# Patient Record
Sex: Female | Born: 1950
Health system: Southern US, Community
[De-identification: ages and names within clinical notes are randomized; demographics above are authoritative.]

## PROBLEM LIST (undated history)

## (undated) DIAGNOSIS — G2581 Restless legs syndrome: Secondary | ICD-10-CM

## (undated) DIAGNOSIS — M797 Fibromyalgia: Secondary | ICD-10-CM

## (undated) DIAGNOSIS — M659 Unspecified synovitis and tenosynovitis, unspecified site: Secondary | ICD-10-CM

## (undated) DIAGNOSIS — L719 Rosacea, unspecified: Secondary | ICD-10-CM

## (undated) DIAGNOSIS — F329 Major depressive disorder, single episode, unspecified: Secondary | ICD-10-CM

## (undated) DIAGNOSIS — C801 Malignant (primary) neoplasm, unspecified: Secondary | ICD-10-CM

## (undated) DIAGNOSIS — R609 Edema, unspecified: Secondary | ICD-10-CM

## (undated) DIAGNOSIS — R7303 Prediabetes: Secondary | ICD-10-CM

## (undated) DIAGNOSIS — I10 Essential (primary) hypertension: Secondary | ICD-10-CM

## (undated) DIAGNOSIS — I890 Lymphedema, not elsewhere classified: Secondary | ICD-10-CM

## (undated) DIAGNOSIS — F32A Depression, unspecified: Secondary | ICD-10-CM

## (undated) DIAGNOSIS — M1990 Unspecified osteoarthritis, unspecified site: Secondary | ICD-10-CM

## (undated) DIAGNOSIS — G4730 Sleep apnea, unspecified: Secondary | ICD-10-CM

## (undated) DIAGNOSIS — M75100 Unspecified rotator cuff tear or rupture of unspecified shoulder, not specified as traumatic: Secondary | ICD-10-CM

## (undated) DIAGNOSIS — I7300 Raynaud's syndrome without gangrene: Secondary | ICD-10-CM

## (undated) DIAGNOSIS — G5600 Carpal tunnel syndrome, unspecified upper limb: Secondary | ICD-10-CM

## (undated) DIAGNOSIS — IMO0002 Reserved for concepts with insufficient information to code with codable children: Secondary | ICD-10-CM

## (undated) DIAGNOSIS — E785 Hyperlipidemia, unspecified: Secondary | ICD-10-CM

## (undated) DIAGNOSIS — D649 Anemia, unspecified: Secondary | ICD-10-CM

## (undated) DIAGNOSIS — H5440 Blindness, one eye, unspecified eye: Secondary | ICD-10-CM

## (undated) DIAGNOSIS — K219 Gastro-esophageal reflux disease without esophagitis: Secondary | ICD-10-CM

## (undated) DIAGNOSIS — M722 Plantar fascial fibromatosis: Secondary | ICD-10-CM

## (undated) HISTORY — PX: TONSILLECTOMY: SUR1361

## (undated) HISTORY — DX: Malignant (primary) neoplasm, unspecified: C80.1

## (undated) HISTORY — DX: Synovitis and tenosynovitis, unspecified: M65.9

## (undated) HISTORY — DX: Unspecified osteoarthritis, unspecified site: M19.90

## (undated) HISTORY — DX: Hyperlipidemia, unspecified: E78.5

## (undated) HISTORY — DX: Depression, unspecified: F32.A

## (undated) HISTORY — DX: Fibromyalgia: M79.7

## (undated) HISTORY — DX: Rosacea, unspecified: L71.9

## (undated) HISTORY — PX: PORT-A-CATH REMOVAL: SHX5289

## (undated) HISTORY — PX: EYE SURGERY: SHX253

## (undated) HISTORY — PX: UVULOPALATOPHARYNGOPLASTY: SHX827

## (undated) HISTORY — DX: Essential (primary) hypertension: I10

## (undated) HISTORY — DX: Restless legs syndrome: G25.81

## (undated) HISTORY — DX: Reserved for concepts with insufficient information to code with codable children: IMO0002

## (undated) HISTORY — DX: Unspecified synovitis and tenosynovitis, unspecified site: M65.90

## (undated) HISTORY — DX: Anemia, unspecified: D64.9

## (undated) HISTORY — DX: Blindness, one eye, unspecified eye: H54.40

## (undated) HISTORY — PX: CARPAL TUNNEL RELEASE: SHX101

## (undated) HISTORY — DX: Major depressive disorder, single episode, unspecified: F32.9

## (undated) HISTORY — PX: NEUROMA SURGERY: SHX722

## (undated) HISTORY — DX: Unspecified rotator cuff tear or rupture of unspecified shoulder, not specified as traumatic: M75.100

## (undated) HISTORY — DX: Edema, unspecified: R60.9

## (undated) HISTORY — DX: Plantar fascial fibromatosis: M72.2

## (undated) HISTORY — PX: THROAT SURGERY: SHX803

## (undated) HISTORY — PX: JOINT REPLACEMENT: SHX530

---

## 1984-07-23 HISTORY — PX: ABDOMINAL HYSTERECTOMY: SHX81

## 2000-03-11 ENCOUNTER — Encounter: Payer: Self-pay | Admitting: *Deleted

## 2000-03-11 ENCOUNTER — Encounter: Admission: RE | Admit: 2000-03-11 | Discharge: 2000-03-11 | Payer: Self-pay | Admitting: *Deleted

## 2003-04-08 ENCOUNTER — Ambulatory Visit (HOSPITAL_BASED_OUTPATIENT_CLINIC_OR_DEPARTMENT_OTHER): Admission: RE | Admit: 2003-04-08 | Discharge: 2003-04-08 | Payer: Self-pay | Admitting: Orthopedic Surgery

## 2003-04-29 ENCOUNTER — Ambulatory Visit (HOSPITAL_COMMUNITY): Admission: RE | Admit: 2003-04-29 | Discharge: 2003-04-29 | Payer: Self-pay | Admitting: Orthopedic Surgery

## 2003-04-29 ENCOUNTER — Ambulatory Visit (HOSPITAL_BASED_OUTPATIENT_CLINIC_OR_DEPARTMENT_OTHER): Admission: RE | Admit: 2003-04-29 | Discharge: 2003-04-29 | Payer: Self-pay | Admitting: Orthopedic Surgery

## 2004-07-23 HISTORY — PX: BLADDER SURGERY: SHX569

## 2005-04-13 ENCOUNTER — Ambulatory Visit (HOSPITAL_COMMUNITY): Admission: RE | Admit: 2005-04-13 | Discharge: 2005-04-13 | Payer: Self-pay | Admitting: Gastroenterology

## 2005-04-13 ENCOUNTER — Encounter (INDEPENDENT_AMBULATORY_CARE_PROVIDER_SITE_OTHER): Payer: Self-pay | Admitting: *Deleted

## 2005-07-23 HISTORY — PX: NOSE SURGERY: SHX723

## 2006-07-23 HISTORY — PX: CARPAL TUNNEL RELEASE: SHX101

## 2007-06-26 ENCOUNTER — Ambulatory Visit: Payer: Self-pay | Admitting: Internal Medicine

## 2008-07-01 ENCOUNTER — Ambulatory Visit: Payer: Self-pay | Admitting: Family Medicine

## 2008-12-02 ENCOUNTER — Ambulatory Visit: Payer: Self-pay | Admitting: Rheumatology

## 2008-12-09 ENCOUNTER — Encounter: Payer: Self-pay | Admitting: *Deleted

## 2008-12-21 ENCOUNTER — Encounter: Payer: Self-pay | Admitting: *Deleted

## 2009-01-20 ENCOUNTER — Encounter: Payer: Self-pay | Admitting: *Deleted

## 2009-02-20 ENCOUNTER — Encounter: Payer: Self-pay | Admitting: *Deleted

## 2009-03-07 ENCOUNTER — Ambulatory Visit: Payer: Self-pay

## 2009-03-30 ENCOUNTER — Ambulatory Visit: Payer: Self-pay | Admitting: Unknown Physician Specialty

## 2009-04-04 ENCOUNTER — Ambulatory Visit: Payer: Self-pay | Admitting: Unknown Physician Specialty

## 2009-04-28 ENCOUNTER — Ambulatory Visit: Payer: Self-pay | Admitting: Orthopedic Surgery

## 2009-05-13 ENCOUNTER — Encounter: Payer: Self-pay | Admitting: Unknown Physician Specialty

## 2009-05-23 ENCOUNTER — Encounter: Payer: Self-pay | Admitting: Unknown Physician Specialty

## 2009-06-03 ENCOUNTER — Ambulatory Visit: Payer: Self-pay | Admitting: Orthopedic Surgery

## 2009-06-13 ENCOUNTER — Ambulatory Visit: Payer: Self-pay | Admitting: Orthopedic Surgery

## 2009-06-22 ENCOUNTER — Encounter: Payer: Self-pay | Admitting: Unknown Physician Specialty

## 2009-07-11 ENCOUNTER — Ambulatory Visit: Payer: Self-pay | Admitting: Family Medicine

## 2009-07-12 ENCOUNTER — Ambulatory Visit: Payer: Self-pay | Admitting: Orthopedic Surgery

## 2009-07-23 DIAGNOSIS — C801 Malignant (primary) neoplasm, unspecified: Secondary | ICD-10-CM

## 2009-07-23 HISTORY — DX: Malignant (primary) neoplasm, unspecified: C80.1

## 2009-12-02 ENCOUNTER — Emergency Department (HOSPITAL_COMMUNITY): Admission: EM | Admit: 2009-12-02 | Discharge: 2009-12-03 | Payer: Self-pay | Admitting: Emergency Medicine

## 2010-07-12 ENCOUNTER — Ambulatory Visit: Payer: Self-pay | Admitting: Family Medicine

## 2010-07-20 ENCOUNTER — Ambulatory Visit: Payer: Self-pay | Admitting: Family Medicine

## 2010-07-23 HISTORY — PX: MASTECTOMY: SHX3

## 2010-07-23 HISTORY — PX: MASTECTOMY MODIFIED RADICAL: SUR848

## 2010-08-15 ENCOUNTER — Ambulatory Visit: Payer: Self-pay | Admitting: Surgery

## 2010-10-16 ENCOUNTER — Ambulatory Visit: Payer: Self-pay | Admitting: Surgery

## 2010-10-22 ENCOUNTER — Ambulatory Visit: Payer: Self-pay | Admitting: Internal Medicine

## 2010-10-23 ENCOUNTER — Other Ambulatory Visit: Payer: Self-pay | Admitting: General Surgery

## 2010-10-23 ENCOUNTER — Ambulatory Visit: Payer: Self-pay | Admitting: Surgery

## 2010-11-07 ENCOUNTER — Ambulatory Visit: Payer: Self-pay | Admitting: Internal Medicine

## 2010-11-09 ENCOUNTER — Ambulatory Visit: Payer: Self-pay | Admitting: Internal Medicine

## 2010-11-10 LAB — CA 125: CA 125: 11.1 U/mL (ref 0.0–34.0)

## 2010-11-20 ENCOUNTER — Other Ambulatory Visit: Payer: Self-pay | Admitting: Oncology

## 2010-11-21 ENCOUNTER — Ambulatory Visit: Payer: Self-pay | Admitting: Internal Medicine

## 2010-11-22 ENCOUNTER — Encounter (HOSPITAL_BASED_OUTPATIENT_CLINIC_OR_DEPARTMENT_OTHER): Payer: Medicare Other | Admitting: Oncology

## 2010-11-22 DIAGNOSIS — C50919 Malignant neoplasm of unspecified site of unspecified female breast: Secondary | ICD-10-CM

## 2010-11-27 LAB — PATHOLOGY REPORT

## 2010-12-01 ENCOUNTER — Other Ambulatory Visit: Payer: Self-pay | Admitting: General Surgery

## 2010-12-01 DIAGNOSIS — C50912 Malignant neoplasm of unspecified site of left female breast: Secondary | ICD-10-CM

## 2010-12-04 ENCOUNTER — Ambulatory Visit
Admission: RE | Admit: 2010-12-04 | Discharge: 2010-12-04 | Disposition: A | Discharge status: Home / Self Care | Payer: Medicare Other | Source: Ambulatory Visit | Attending: General Surgery | Admitting: General Surgery

## 2010-12-04 ENCOUNTER — Other Ambulatory Visit: Payer: Self-pay | Admitting: General Surgery

## 2010-12-04 ENCOUNTER — Other Ambulatory Visit: Payer: Self-pay | Admitting: Diagnostic Radiology

## 2010-12-04 DIAGNOSIS — C50912 Malignant neoplasm of unspecified site of left female breast: Secondary | ICD-10-CM

## 2010-12-04 MED ORDER — GADOBENATE DIMEGLUMINE 529 MG/ML IV SOLN
15.0000 mL | Freq: Once | INTRAVENOUS | Status: AC | PRN
  Administered 2010-12-04: 15 mL via INTRAVENOUS

## 2010-12-08 NOTE — Op Note (Signed)
Krystal [name], Krystal [name]               ACCOUNT NO.:  [id number]   MEDICAL RECORD NO.:  [id number]          PATIENT TYPE:  AMB   LOCATION:  ENDO                         FACILITY:  [facility]   PHYSICIAN:  Anselmo Rod, M.D.  DATE OF BIRTH:  1950/08/27   DATE OF PROCEDURE:  04/13/2005  DATE OF DISCHARGE:                                 OPERATIVE REPORT   PROCEDURE:  Colonoscopy with snare polypectomy x 1.   ENDOSCOPIST:  Charna Elizabeth, M.D.   INSTRUMENT USED:  Olympus video colonoscope.   INDICATIONS FOR PROCEDURE:  60 year old white female with a history of  change in bowel habits, rectal bleeding, and history of hemorrhoids,  undergoing a screening colonoscopy to rule out colonic polyps, masses, etc.   PREPROCEDURE PREPARATION:  Informed consent was procured from the patient.  The patient was fasted for eight hours prior to the procedure and prepped  with a bottle of magnesium citrate and a gallon of GoLYTELY the night prior  to the procedure.  The risks and benefits of the procedure including a 10%  missed rate of cancer and polyp were discussed with the patient, as well.   PREPROCEDURE PHYSICAL:  Patient with stable vital signs.  Neck supple.  Chest clear to auscultation.  S1 and S2 regular.  Abdomen soft with normal  bowel sounds.   DESCRIPTION OF PROCEDURE:  The patient was placed in the left lateral  decubitus position, sedated with 70 mg of Demerol and 7.5 mg Versed in slow  incremental doses.  Once the patient was adequately sedated, maintained on  low flow oxygen and continuous cardiac monitoring, the Olympus video  colonoscope was advanced from the rectum to the cecum in the terminal ileum  without difficulty.  There was some residual stool in the right colon,  multiple washes were done.  There was evidence inflamed external hemorrhoids  seen on anal inspection.  Pandiverticulosis was noted with diverticula in  various stages of formation.  A small sessile polyp was seen in  the proximal  right colon and was removed by hot snare x 1.  The terminal ileum appeared  normal.  Retroflexion in the rectum revealed no abnormalities.   IMPRESSION:  1.  Pandiverticulosis.  2.  Inflamed external hemorrhoids.  3.  Small sessile polyp removed by hot snare from the proximal right colon.  4.  Normal terminal ileum.   RECOMMENDATIONS:  1.  Await pathology results.  2.  Avoid nonsteroidals including aspirin for the next two weeks.  3.  Repeat colonoscopy depending on pathology results.  4.  Brochures on diverticulosis has been given to the patient for education.  5.  Proctokit samples will be given to her from the office for her      hemorrhoids, sitz baths have been advocated along with a high fiber      diet.  6.  Outpatient follow up as need arises in the future.      Anselmo Rod, M.D.  Electronically Signed     JNM/MEDQ  D:  04/13/2005  T:  04/13/2005  Job:  [id number]   cc:  Olene Craven, M.D.  Fax: [phone]

## 2010-12-08 NOTE — Op Note (Signed)
NAME:  Krystal [name], Krystal [name]                         ACCOUNT NO.:  [id number]   MEDICAL RECORD NO.:  [id number]                   PATIENT TYPE:  AMB   LOCATION:  DSC                                  FACILITY:  [facility]   PHYSICIAN:  Katy Fitch. Naaman Plummer., M.D.          DATE OF BIRTH:  [birth year]   DATE OF PROCEDURE:  04/08/2003  DATE OF DISCHARGE:                                 OPERATIVE REPORT   PREOPERATIVE DIAGNOSES:  Entrapment neuropathy of median nerve, left carpal  tunnel.   POSTOPERATIVE DIAGNOSES:  Entrapment neuropathy of median nerve, left carpal  tunnel.   OPERATION PERFORMED:  Release of left transverse carpal ligament.   SURGEON:  Katy Fitch. Sypher, M.D.   ASSISTANT:  Jonni Sanger, P.A.   ANESTHESIA:  General by LMA.   SUPERVISING ANESTHESIOLOGIST:  Dr. Jean Rosenthal.   INDICATIONS FOR PROCEDURE:  Suetta Hoffmeister is a 60 year old woman referred by  Olene Craven, M.D. for evaluation and management of hand numbness on  the left.  Clinical examination revealed signs of carpal tunnel syndrome.  Electrodiagnostic studies confirmed median neuropathy.  Due to failure to  respond to nonoperative measures, the patient is brought to the operating  room at this time for release of her left transverse carpal ligament.   DESCRIPTION OF PROCEDURE:  Jaree Dwight was brought to the operating room  and placed in supine position upon the operating table.  Following induction  of general anesthesia, the left arm was prepped with Betadine soap and  solution and sterilely draped.  Following exsanguination of the limb with an  Esmarch bandage, an arterial tourniquet on the proximal brachium was  inflated to 220 mmHg.  The procedure commenced with a short incision in line  with the ring finger in the palm.  Subcutaneous tissues are carefully  divided revealing the palmar fascia.  The fascia was split longitudinally to  reveal the common sensory branch of the median nerve and the  superficial  palmar arch.  This was followed back to the transverse carpal ligament.  The  median nerve was gently isolated from the ligament followed by release of  the transverse carpal ligament with scissors along its ulnar border  extending to the distal forearm.  The volar forearm fascia was released  subcutaneously with scissors.  The carpal canal was widely opened.  No  masses or other predicaments were noted.  Bleeding points along the margin  of the released ligament were electrocauterized with bipolar current.  The  wound was repaired with intradermal 3-0 Prolene suture and steri-stripped.  A compressive dressing was applied with a volar plaster splint maintaining  the wrist in five degrees dorsiflexion.  Katy Fitch Naaman Plummer., M.D.    RVS/MEDQ  D:  04/08/2003  T:  04/09/2003  Job:  [id number]

## 2010-12-08 NOTE — Op Note (Signed)
Krystal [name], Krystal [name]                         ACCOUNT NO.:  [id number]   MEDICAL RECORD NO.:  [id number]                   PATIENT TYPE:  AMB   LOCATION:  DSC                                  FACILITY:  [facility]   PHYSICIAN:  Katy Fitch. Naaman Plummer., M.D.          DATE OF BIRTH:  May 04, 1951   DATE OF PROCEDURE:  04/29/2003  DATE OF DISCHARGE:                                 OPERATIVE REPORT   PREOPERATIVE DIAGNOSIS:  Entrapment neuropathy median nerve, right carpal  tunnel.   POSTOPERATIVE DIAGNOSIS:  Entrapment neuropathy median nerve, right carpal  tunnel.   OPERATION:  Release of right transverse carpal ligament.   SURGEON:  Katy Fitch. Sypher, M.D.   ASSISTANT:  None.   ANESTHESIA:  General by LMA.   ANESTHESIOLOGIST:  Burna Forts, M.D.   INDICATIONS FOR PROCEDURE:  Krystal [name] is a 60 year old woman referred by  her primary care physician for evaluation and management of bilateral hand  numbness.  Clinical examination revealed signs of significant carpal tunnel  syndrome.  Electrodiagnostic studies confirmed significant bilateral median  neuropathy.  She is now three weeks status post release of her left  transverse carpal ligament and release of her right transverse carpal  ligament at this time.  She is noted to have background osteoarthrosis of  her CMC joints of the thumb which is recognized but will not be addressed  today.   PROCEDURE:  Krystal [name] is brought to the operating room and placed on  supine position on the operating table.  Following induction of general  anesthesia by LMA, the right arm was prepped with Betadine solution and  sterilely draped.  A pneumatic tourniquet was applied to the right proximal  brachium.  Following exsanguination of the right arm with an Esmarch  bandage, the arterial tourniquet was inflated to 210 mmHg.  The procedure  commenced with a short incision in the line of the ring finger in the palm.  The subcutaneous tissues  were carefully divided revealing the palmar fascia.  This was split longitudinally over the common sensory branches of the median  nerve.  These were followed back to the transverse carpal ligament which was  carefully isolated from the median nerve.  The ligament was released along  the ulnar border extending into the distal forearm.  This widely opened the  carpal canal.  No masses were noted.  Bleeding points along the margins of  the released ligament were electrocauterized followed by repair of the skin  with intradermal 3-0 Prolene suture.  A compressive dressing was applied  with a volar plaster splint with the wrist in 5 degrees dorsiflexion.                                               Katy Fitch Sypher [name].,  M.D.    RVS/MEDQ  D:  04/29/2003  T:  04/29/2003  Job:  [id number]   cc:   Olene Craven, M.D.  [address]  [address]  [place]  [place] [address]  Fax: [phone]

## 2010-12-11 ENCOUNTER — Encounter (HOSPITAL_BASED_OUTPATIENT_CLINIC_OR_DEPARTMENT_OTHER): Payer: Medicare Other | Admitting: Oncology

## 2010-12-11 DIAGNOSIS — C50919 Malignant neoplasm of unspecified site of unspecified female breast: Secondary | ICD-10-CM

## 2010-12-25 ENCOUNTER — Encounter (HOSPITAL_COMMUNITY)
Admission: RE | Admit: 2010-12-25 | Discharge: 2010-12-25 | Disposition: A | Discharge status: Home / Self Care | Payer: Medicare Other | Source: Ambulatory Visit | Attending: General Surgery | Admitting: General Surgery

## 2010-12-25 ENCOUNTER — Other Ambulatory Visit (HOSPITAL_COMMUNITY): Payer: Self-pay | Admitting: General Surgery

## 2010-12-25 DIAGNOSIS — C50919 Malignant neoplasm of unspecified site of unspecified female breast: Secondary | ICD-10-CM

## 2010-12-25 LAB — COMPREHENSIVE METABOLIC PANEL
ALT: 21 U/L (ref 0–35)
AST: 25 U/L (ref 0–37)
Albumin: 4.2 g/dL (ref 3.5–5.2)
Calcium: 9.8 mg/dL (ref 8.4–10.5)
Creatinine, Ser: 0.75 mg/dL (ref 0.4–1.2)
GFR calc Af Amer: >60 mL/min (ref >60)
Sodium: 140 mEq/L (ref 135–145)

## 2010-12-25 LAB — DIFFERENTIAL
Basophils Absolute: 0 10*3/uL (ref 0.0–0.1)
Eosinophils Absolute: 0.4 10*3/uL (ref 0.0–0.7)
Eosinophils Relative: 4 % (ref 0–5)
Lymphocytes Relative: 28 % (ref 12–46)
Lymphs Abs: 2.5 10*3/uL (ref 0.7–4.0)
Neutrophils Relative %: 60 % (ref 43–77)

## 2010-12-25 LAB — CBC
HCT: 44.2 % (ref 36.0–46.0)
Platelets: 302 10*3/uL (ref 150–400)
RBC: 4.99 MIL/uL (ref 3.87–5.11)
RDW: 14 % (ref 11.5–15.5)
WBC: 8.9 10*3/uL (ref 4.0–10.5)

## 2010-12-25 LAB — TYPE AND SCREEN

## 2010-12-25 LAB — ABO/RH: ABO/RH(D): A POS

## 2010-12-25 LAB — PROTIME-INR: INR: 0.94 (ref 0.00–1.49)

## 2010-12-25 LAB — SURGICAL PCR SCREEN
MRSA, PCR: NEGATIVE
Staphylococcus aureus: POSITIVE — AB

## 2010-12-28 ENCOUNTER — Other Ambulatory Visit: Payer: Self-pay | Admitting: General Surgery

## 2010-12-28 ENCOUNTER — Observation Stay (HOSPITAL_COMMUNITY)
Admission: RE | Admit: 2010-12-28 | Discharge: 2010-12-29 | Disposition: A | Discharge status: Home / Self Care | Payer: Medicare Other | Source: Ambulatory Visit | Attending: General Surgery | Admitting: General Surgery

## 2010-12-28 DIAGNOSIS — C50419 Malignant neoplasm of upper-outer quadrant of unspecified female breast: Principal | ICD-10-CM | POA: Insufficient documentation

## 2010-12-28 DIAGNOSIS — I10 Essential (primary) hypertension: Secondary | ICD-10-CM | POA: Insufficient documentation

## 2010-12-28 DIAGNOSIS — G4733 Obstructive sleep apnea (adult) (pediatric): Secondary | ICD-10-CM | POA: Insufficient documentation

## 2010-12-28 DIAGNOSIS — Z01810 Encounter for preprocedural cardiovascular examination: Secondary | ICD-10-CM | POA: Insufficient documentation

## 2011-01-04 NOTE — Op Note (Signed)
Krystal [name], Krystal [name]               ACCOUNT NO.:  [id number]  MEDICAL RECORD NO.:  [id number]  LOCATION:  5126                         FACILITY:  [facility]  PHYSICIAN:  Adolph Pollack, M.D.DATE OF BIRTH:  1951/01/15  DATE OF PROCEDURE:  12/28/2010 DATE OF DISCHARGE:                              OPERATIVE REPORT   PREOPERATIVE DIAGNOSIS:  Left breast cancer.  POSTOPERATIVE DIAGNOSIS:  Left breast cancer.  PROCEDURE: 1. Left modified radical mastectomy. 2. Right total mastectomy.  SURGEON:  Adolph Pollack, MD  ASSISTANT:  Currie Paris, MD  ANESTHESIA:  General.  INDICATIONS:  Ms. Osment is a 60 year old female found to have an abnormal mammogram with a density in the upper outer quadrant of the left breast.  Biopsy was performed at an outside facility and history reported positive for breast cancer.  [facility] opinion and further review of the slice demonstrated this to what appeared to be an inframammary lymph node with extracapsular extension of her breast cancer.  Thorough workup was done including MRI and evaluation by Radiation Oncology as well as Medical Oncology.  She also had a BSGI.  She had a previous PET scan demonstrating hypermetabolic activity in left axilla.  Image-guided biopsy was performed here at the breast center, they were positive for metastatic cancer.  She is not interested in breast conservation and so now presents for the above procedure.  She also wants a prophylactic right mastectomy.  Procedure risks and aftercare were discussed preoperatively.  TECHNIQUE:  She is brought to the operating room, placed supine on the operating table and a general anesthetic was administered.  Bilateral chest walls were widely sterilely prepped and draped.  Beginning on the right sided, I made an elliptical incision through the breast tissue and dermis.  Using electrocautery, I raised  subcutaneous flaps to the clavicle superiorly, lateral border of the sternum medially, the anterior rectus sheath inferiorly, and latissimus muscle laterally.  We used electrocautery to dissect the breast tissue free from the pectoralis fascia and once this was done marked the medial aspect of the breast with a suture and sent it to pathology.  I then irrigated the wound and controlled bleeding with electrocautery. Once hemostasis was adequate, two stab incisions were made in the inferior flap.  Two 19 Blake drains were then placed one laterally and one medially and anchored to the skin with 3-0 nylon suture.  I then closed the subcutaneous tissue over the drains and pectoralis muscle with interrupted 0 Vicryl sutures.  The skin was closed with a running 4- 0 Monocryl subcuticular stitch.  Steri-Strips were applied.  Following this, the left side was approached.  An elliptical incision was made in the breast through the skin and dermis.  Using electrocautery, I raised the subcutaneous flaps superiorly to the clavicle, medially to the lateral edge of the sternum, inferiorly to the anterior rectus sheath and laterally to the latissimus.  I then dissected the breast free from the pectoralis major muscle and fascia and as I was retracting it laterally I went up and divided the clavipectoral fascia and entered the axillary region.  The axillary vein was identified.  Using blunt dissection, I began sweeping out the axillary contents.  I identified the long thoracic and thoracodorsal nerves.  I dissected all lymphatic tissue between these two nerves and clipped small lymphatics and vessels and divided them.  I then removed some of the Rotters nodse area as well.  I then dissected the breast and axillary tissue free from its lateral attachments and sent this to pathology marking the medial aspect with a suture.  I then tested both the thoracodorsal and long thoracic nerves and both of these  were intact and working.  Following this, I irrigated the wound out and controlled bleeding with electrocautery.  Once hemostasis was adequate, two stab wounds were made in the inferior flap.  A 19 Blake drain was placed in the axilla and one in the anterior chest wall.  These were exited through the skin with a 3- 0 nylon suture.  The subcutaneous tissue was closed over the mesh and drains with interrupted 3-0 Vicryl suture.  The skin was closed with 4-0 Monocryl subcuticular stitch.  Steri-Strips were applied.  Sterile dressings were then placed on all wounds.  The drains  all held suction well.  She tolerated the procedures well without any apparent complications. She subsequently was taken to the recovery room in satisfactory condition.     Adolph Pollack, M.D.     Kari Baars  D:  12/28/2010  T:  12/29/2010  Job:  [id number]  cc:   Jerl Mina, MD Drue Second, M.D. Adella Hare, MD Maryln Gottron, M.D.  Electronically Signed by Avel Peace M.D. on 01/04/2011 10:39:04 AM

## 2011-01-09 ENCOUNTER — Encounter (INDEPENDENT_AMBULATORY_CARE_PROVIDER_SITE_OTHER): Payer: Self-pay | Admitting: General Surgery

## 2011-01-11 ENCOUNTER — Other Ambulatory Visit: Payer: Self-pay | Admitting: Oncology

## 2011-01-11 ENCOUNTER — Encounter: Payer: Medicare Other | Admitting: Oncology

## 2011-01-11 DIAGNOSIS — C50919 Malignant neoplasm of unspecified site of unspecified female breast: Secondary | ICD-10-CM

## 2011-01-11 LAB — COMPREHENSIVE METABOLIC PANEL
ALT: 25 U/L (ref 0–35)
CO2: 28 mEq/L (ref 19–32)
Calcium: 9 mg/dL (ref 8.4–10.5)
Chloride: 100 mEq/L (ref 96–112)
Sodium: 136 mEq/L (ref 135–145)
Total Protein: 6.6 g/dL (ref 6.0–8.3)

## 2011-01-11 LAB — CBC WITH DIFFERENTIAL/PLATELET
BASO%: 1 % (ref 0.0–2.0)
EOS%: 6.8 % (ref 0.0–7.0)
LYMPH%: 39.8 % (ref 14.0–49.7)
MCHC: 33.5 g/dL (ref 31.5–36.0)
MONO#: 0.5 10*3/uL (ref 0.1–0.9)
Platelets: 389 10*3/uL (ref 145–400)
RBC: 4.37 10*6/uL (ref 3.70–5.45)
WBC: 6.2 10*3/uL (ref 3.9–10.3)
lymph#: 2.5 10*3/uL (ref 0.9–3.3)

## 2011-01-16 ENCOUNTER — Encounter (INDEPENDENT_AMBULATORY_CARE_PROVIDER_SITE_OTHER): Payer: Self-pay | Admitting: General Surgery

## 2011-01-16 ENCOUNTER — Ambulatory Visit (INDEPENDENT_AMBULATORY_CARE_PROVIDER_SITE_OTHER): Payer: Medicare Other | Admitting: General Surgery

## 2011-01-16 VITALS — BP 122/70 | HR 68 | Temp 96.9°F | Ht 63.0 in | Wt 163.6 lb

## 2011-01-16 DIAGNOSIS — C773 Secondary and unspecified malignant neoplasm of axilla and upper limb lymph nodes: Secondary | ICD-10-CM

## 2011-01-16 DIAGNOSIS — C50512 Malignant neoplasm of lower-outer quadrant of left female breast: Secondary | ICD-10-CM | POA: Insufficient documentation

## 2011-01-16 DIAGNOSIS — C50919 Malignant neoplasm of unspecified site of unspecified female breast: Secondary | ICD-10-CM

## 2011-01-16 NOTE — Progress Notes (Signed)
Subjective:     Patient ID: Krystal [name], female   DOB: 04-Apr-1951, 60 y.o.   MRN: [id number]    BP 122/70  Pulse 68  Temp(Src) 96.9 F (36.1 C) (Temporal)  Ht 5\' 3"  (1.6 m)  Wt 163 lb 9.6 oz (74.208 kg)  BMI 28.98 kg/m2    HPI She is here s/p right mastectomy and left MRM for left breast CA.  1 of 15 nodes positive.  Drains 1,2, and 4 are putting out less than 30cc/day.  Drain 3 is 30cc or more.  She is scheduled for Portacath insertion on 6/28.  She is having minimal pain.  Review of Systems     Objective:   Physical Exam Right chest wall:  Incision is clean/dry/intact.  Two drains in place with serous output.  Small amount of lateral swelling. Left chest wall:  Incision is clean/dry/intact.  Two drains in place.  Small amount of lateral swelling.    Assessment:     TxN1 left breast cancer s/p right mastectomy and left MRM.  Doing well.    Plan:    Remove drains 1,2, and 4-done. Keep drain 3 in.  Proceed with Portacath insertion.    The procedure risks and aftercare been explained. Risks include but are not limited to bleeding, infection, malfunction, pneumothorax, wound problems, DVT.

## 2011-01-18 ENCOUNTER — Ambulatory Visit (HOSPITAL_COMMUNITY): Payer: Medicare Other | Attending: General Surgery

## 2011-01-18 ENCOUNTER — Ambulatory Visit (HOSPITAL_COMMUNITY): Payer: Medicare Other

## 2011-01-18 ENCOUNTER — Ambulatory Visit (HOSPITAL_BASED_OUTPATIENT_CLINIC_OR_DEPARTMENT_OTHER)
Admission: RE | Admit: 2011-01-18 | Discharge: 2011-01-18 | Disposition: A | Discharge status: Home / Self Care | Payer: Medicare Other | Source: Ambulatory Visit | Attending: General Surgery | Admitting: General Surgery

## 2011-01-18 DIAGNOSIS — Z01810 Encounter for preprocedural cardiovascular examination: Secondary | ICD-10-CM | POA: Insufficient documentation

## 2011-01-18 DIAGNOSIS — G4733 Obstructive sleep apnea (adult) (pediatric): Secondary | ICD-10-CM | POA: Insufficient documentation

## 2011-01-18 DIAGNOSIS — I10 Essential (primary) hypertension: Secondary | ICD-10-CM | POA: Insufficient documentation

## 2011-01-18 DIAGNOSIS — C50919 Malignant neoplasm of unspecified site of unspecified female breast: Secondary | ICD-10-CM

## 2011-01-18 DIAGNOSIS — Z9010 Acquired absence of unspecified breast and nipple: Secondary | ICD-10-CM | POA: Insufficient documentation

## 2011-01-18 DIAGNOSIS — K219 Gastro-esophageal reflux disease without esophagitis: Secondary | ICD-10-CM | POA: Insufficient documentation

## 2011-01-22 ENCOUNTER — Encounter: Payer: Medicare Other | Admitting: Oncology

## 2011-01-22 ENCOUNTER — Ambulatory Visit (HOSPITAL_COMMUNITY)
Admission: RE | Admit: 2011-01-22 | Discharge: 2011-01-22 | Disposition: A | Discharge status: Home / Self Care | Payer: Medicare Other | Source: Ambulatory Visit | Attending: Oncology | Admitting: Oncology

## 2011-01-22 DIAGNOSIS — C50919 Malignant neoplasm of unspecified site of unspecified female breast: Secondary | ICD-10-CM | POA: Insufficient documentation

## 2011-01-22 DIAGNOSIS — I519 Heart disease, unspecified: Secondary | ICD-10-CM | POA: Insufficient documentation

## 2011-01-22 DIAGNOSIS — Z01818 Encounter for other preprocedural examination: Secondary | ICD-10-CM | POA: Insufficient documentation

## 2011-01-22 DIAGNOSIS — I10 Essential (primary) hypertension: Secondary | ICD-10-CM | POA: Insufficient documentation

## 2011-01-22 NOTE — Op Note (Signed)
NAMEGRACEN, RINGWALD               ACCOUNT NO.:  [id number]  MEDICAL RECORD NO.:  [id number]  LOCATION:                                 FACILITY:  PHYSICIAN:  Adolph Pollack, M.D.DATE OF BIRTH:  11/26/1950  DATE OF PROCEDURE:  01/18/2011 DATE OF DISCHARGE:                              OPERATIVE REPORT   PREOPERATIVE DIAGNOSIS:  Left breast cancer.  POSTOPERATIVE DIAGNOSIS:  Left breast cancer.  PROCEDURE:  Ultrasound-guided Port-A-Cath insertion into right internal jugular vein with fluoroscopy.  SURGEON:  Adolph Pollack, MD  ANESTHESIA:  General/LMA with Marcaine and local.  INDICATIONS:  Ms. Wheeling is a 60 year old female with left breast cancer who requires long-term venous access for chemotherapy.  She is status post bilateral mastectomies.  She now presents for the above procedure. We discussed this procedure risks and aftercare preoperatively.  TECHNIQUE:  She was brought to the operating, placed supine on the operating table.  General anesthetic was administered by LMA.  The upper chest wall and neck areas bilaterally were sterilely prepped and draped.  Using ultrasound, I visualized the internal jugular vein and under ultrasound guidance I cannulated the internal jugular vein with a 16 gauge needle.  A wire was passed through the needle into the superior vena cava and right heart and the wire position was confirmed by fluoroscopy.  The needle was then removed.  An incision was made around the wire.  I then picked the port in the chest wall and the right infraclavicular region and then injected local anesthetic into the subcutaneous tissue. A small incision was made about the size of the port and carried down to the chest wall we are using electrocautery.  A pocket was made for the port.  Local anesthetic was injected between the inferior and superior incisions and then the Port-A-Cath catheter (PowerPort) was tunneled from the inferior to the superior  incision.  The dilator introducer complex was then placed over the wire and the dilator and wire removed. The catheter was threaded away through the peel-away sheath introducer which was removed.  I then pulled the catheter back to distal superior vena cava as evidenced by fluoroscopy.  Catheter was cut and attached to the port.  The port was attached to the chest wall and 2-0 Vicryl suture.  The port aspirated blood and flushed easily.  Concentrated heparin was left in the port.  One more fluoroscopic picture was noted and there was nice curvature to the wire with adequate position of the port and the tip of the catheter in the superior vena cava.  Following this, I closed the neck incision with a 4-0 Monocryl and subcuticular stitch to approximate the skin edges.  The chest wall incision was closed in two layers.  The subcutaneous tissue was closed with running 3-0 Vicryl suture.  The skin was closed with 4-0 Monocryl subcuticular stitches.  Steri-Strips and sterile dressings were applied.  She tolerated the procedure without any apparent complications, he was taken to recovery room in satisfactory condition where a stat portable chest x-ray is pending.     Adolph Pollack, M.D.     Kari Baars  D:  01/18/2011  T:  01/19/2011  Job:  [id number]  Electronically Signed by Avel Peace M.D. on 01/22/2011 09:23:47 AM

## 2011-01-29 ENCOUNTER — Other Ambulatory Visit: Payer: Self-pay | Admitting: Oncology

## 2011-01-29 ENCOUNTER — Encounter (HOSPITAL_BASED_OUTPATIENT_CLINIC_OR_DEPARTMENT_OTHER): Payer: Medicare Other | Admitting: Oncology

## 2011-01-29 DIAGNOSIS — C50919 Malignant neoplasm of unspecified site of unspecified female breast: Secondary | ICD-10-CM

## 2011-01-29 DIAGNOSIS — C50219 Malignant neoplasm of upper-inner quadrant of unspecified female breast: Secondary | ICD-10-CM

## 2011-01-29 LAB — COMPREHENSIVE METABOLIC PANEL
Albumin: 4.2 g/dL (ref 3.5–5.2)
BUN: 10 mg/dL (ref 6–23)
Calcium: 9.1 mg/dL (ref 8.4–10.5)
Chloride: 101 mEq/L (ref 96–112)
Creatinine, Ser: 0.75 mg/dL (ref 0.50–1.10)
Glucose, Bld: 94 mg/dL (ref 70–99)
Potassium: 4 mEq/L (ref 3.5–5.3)

## 2011-01-29 LAB — CBC WITH DIFFERENTIAL/PLATELET
Basophils Absolute: 0 10*3/uL (ref 0.0–0.1)
Eosinophils Absolute: 0.3 10*3/uL (ref 0.0–0.5)
HCT: 37.7 % (ref 34.8–46.6)
HGB: 13 g/dL (ref 11.6–15.9)
MCV: 88.2 fL (ref 79.5–101.0)
MONO%: 10 % (ref 0.0–14.0)
NEUT#: 3 10*3/uL (ref 1.5–6.5)
NEUT%: 48.4 % (ref 38.4–76.8)
RDW: 14.7 % — ABNORMAL HIGH (ref 11.2–14.5)
lymph#: 2.2 10*3/uL (ref 0.9–3.3)

## 2011-01-30 ENCOUNTER — Encounter (INDEPENDENT_AMBULATORY_CARE_PROVIDER_SITE_OTHER): Payer: Self-pay | Admitting: General Surgery

## 2011-01-30 ENCOUNTER — Ambulatory Visit (INDEPENDENT_AMBULATORY_CARE_PROVIDER_SITE_OTHER): Payer: Medicare Other | Admitting: General Surgery

## 2011-01-30 DIAGNOSIS — C773 Secondary and unspecified malignant neoplasm of axilla and upper limb lymph nodes: Secondary | ICD-10-CM

## 2011-01-30 DIAGNOSIS — C50919 Malignant neoplasm of unspecified site of unspecified female breast: Secondary | ICD-10-CM

## 2011-01-30 NOTE — Progress Notes (Signed)
She is s/p left MRM and right mastectomy 12/28/10.  She still has one drain in. It is draining approximately 40 cc per day. She notes a small amount of swelling bilaterally.  She had a recent Port-A-Cath placement and has tolerated this well.  Physical exam: Left chest wall incision is clean and intact. Small amount of lateral swelling is noted that is not tense. Right chest wall incision is clean and intact. A swelling noted in the right chest wall and one remaining drain is present with serous output. The drain was removed and a sterile dressing applied.  Impression: Status post bilateral mastectomies and left axillary lymph node dissection. Remaining drain removed. Wounds healing well. Port-A-Cath wound looks good.  Plan: Start increasing activities. Return visit in one month or sooner if she has tense swelling.

## 2011-01-31 ENCOUNTER — Ambulatory Visit: Payer: Medicare Other | Attending: Oncology | Admitting: Physical Therapy

## 2011-01-31 DIAGNOSIS — I890 Lymphedema, not elsewhere classified: Secondary | ICD-10-CM | POA: Insufficient documentation

## 2011-01-31 DIAGNOSIS — M24519 Contracture, unspecified shoulder: Secondary | ICD-10-CM | POA: Insufficient documentation

## 2011-01-31 DIAGNOSIS — IMO0001 Reserved for inherently not codable concepts without codable children: Secondary | ICD-10-CM | POA: Insufficient documentation

## 2011-02-05 ENCOUNTER — Ambulatory Visit: Payer: Medicare Other | Admitting: Physical Therapy

## 2011-02-06 ENCOUNTER — Ambulatory Visit
Admission: RE | Admit: 2011-02-06 | Discharge: 2011-02-06 | Disposition: A | Discharge status: Home / Self Care | Payer: Medicare Other | Source: Ambulatory Visit | Attending: Radiation Oncology | Admitting: Radiation Oncology

## 2011-02-06 DIAGNOSIS — Z9010 Acquired absence of unspecified breast and nipple: Secondary | ICD-10-CM | POA: Insufficient documentation

## 2011-02-06 DIAGNOSIS — C50919 Malignant neoplasm of unspecified site of unspecified female breast: Secondary | ICD-10-CM | POA: Insufficient documentation

## 2011-02-06 DIAGNOSIS — Z170 Estrogen receptor positive status [ER+]: Secondary | ICD-10-CM | POA: Insufficient documentation

## 2011-02-07 ENCOUNTER — Ambulatory Visit: Payer: Medicare Other | Admitting: Physical Therapy

## 2011-02-07 ENCOUNTER — Encounter (INDEPENDENT_AMBULATORY_CARE_PROVIDER_SITE_OTHER): Payer: Medicare Other | Admitting: General Surgery

## 2011-02-08 ENCOUNTER — Encounter (HOSPITAL_BASED_OUTPATIENT_CLINIC_OR_DEPARTMENT_OTHER): Payer: Medicare Other | Admitting: Oncology

## 2011-02-08 ENCOUNTER — Other Ambulatory Visit: Payer: Self-pay | Admitting: Oncology

## 2011-02-08 DIAGNOSIS — C50219 Malignant neoplasm of upper-inner quadrant of unspecified female breast: Secondary | ICD-10-CM

## 2011-02-08 DIAGNOSIS — Z5111 Encounter for antineoplastic chemotherapy: Secondary | ICD-10-CM

## 2011-02-08 DIAGNOSIS — C50919 Malignant neoplasm of unspecified site of unspecified female breast: Secondary | ICD-10-CM

## 2011-02-08 LAB — CBC WITH DIFFERENTIAL/PLATELET
Basophils Absolute: 0 10*3/uL (ref 0.0–0.1)
EOS%: 4.7 % (ref 0.0–7.0)
Eosinophils Absolute: 0.2 10*3/uL (ref 0.0–0.5)
HGB: 12.7 g/dL (ref 11.6–15.9)
MONO#: 0.5 10*3/uL (ref 0.1–0.9)
NEUT#: 2.3 10*3/uL (ref 1.5–6.5)
RDW: 14.5 % (ref 11.2–14.5)
lymph#: 2.1 10*3/uL (ref 0.9–3.3)

## 2011-02-08 LAB — COMPREHENSIVE METABOLIC PANEL
AST: 18 U/L (ref 0–37)
Albumin: 4 g/dL (ref 3.5–5.2)
BUN: 15 mg/dL (ref 6–23)
CO2: 26 mEq/L (ref 19–32)
Calcium: 9.2 mg/dL (ref 8.4–10.5)
Chloride: 100 mEq/L (ref 96–112)
Glucose, Bld: 96 mg/dL (ref 70–99)
Potassium: 3.8 mEq/L (ref 3.5–5.3)

## 2011-02-09 ENCOUNTER — Encounter (HOSPITAL_BASED_OUTPATIENT_CLINIC_OR_DEPARTMENT_OTHER): Payer: Medicare Other | Admitting: Oncology

## 2011-02-09 DIAGNOSIS — C50919 Malignant neoplasm of unspecified site of unspecified female breast: Secondary | ICD-10-CM

## 2011-02-12 ENCOUNTER — Ambulatory Visit: Payer: Medicare Other | Admitting: Physical Therapy

## 2011-02-12 ENCOUNTER — Encounter (INDEPENDENT_AMBULATORY_CARE_PROVIDER_SITE_OTHER): Payer: Self-pay

## 2011-02-13 ENCOUNTER — Encounter (INDEPENDENT_AMBULATORY_CARE_PROVIDER_SITE_OTHER): Payer: Self-pay | Admitting: General Surgery

## 2011-02-13 ENCOUNTER — Ambulatory Visit (INDEPENDENT_AMBULATORY_CARE_PROVIDER_SITE_OTHER): Payer: Medicare Other | Admitting: General Surgery

## 2011-02-13 DIAGNOSIS — C50919 Malignant neoplasm of unspecified site of unspecified female breast: Secondary | ICD-10-CM

## 2011-02-13 NOTE — Progress Notes (Signed)
Operation: Left MRM and Right Mastectomy   Date:  12/28/10  Pathology: Stage III  HPI:  She is here for another postoperative visit. She reports some bilateral swelling. She is tolerating her chemotherapy treatments.   Physical Exam:  Bilateral chest wall incisions are clean and intact. There is mild swelling bilaterally in the lateral aspect. Port-A-Cath site is clean.   Assessment:  Left breast cancer status post above procedures. Wounds healing well.  Plan:  Return visit 3 months.

## 2011-02-14 ENCOUNTER — Ambulatory Visit: Payer: Medicare Other | Admitting: Physical Therapy

## 2011-02-15 ENCOUNTER — Other Ambulatory Visit: Payer: Self-pay | Admitting: Oncology

## 2011-02-15 ENCOUNTER — Encounter (HOSPITAL_BASED_OUTPATIENT_CLINIC_OR_DEPARTMENT_OTHER): Payer: Medicare Other | Admitting: Oncology

## 2011-02-15 DIAGNOSIS — C50219 Malignant neoplasm of upper-inner quadrant of unspecified female breast: Secondary | ICD-10-CM

## 2011-02-15 DIAGNOSIS — C50919 Malignant neoplasm of unspecified site of unspecified female breast: Secondary | ICD-10-CM

## 2011-02-15 LAB — CBC WITH DIFFERENTIAL/PLATELET
EOS%: 12.4 % — ABNORMAL HIGH (ref 0.0–7.0)
LYMPH%: 48.3 % (ref 14.0–49.7)
MCH: 29.8 pg (ref 25.1–34.0)
MCHC: 34 g/dL (ref 31.5–36.0)
MCV: 87.4 fL (ref 79.5–101.0)
MONO%: 1.6 % (ref 0.0–14.0)
Platelets: 210 10*3/uL (ref 145–400)
RBC: 4.18 10*6/uL (ref 3.70–5.45)
RDW: 14.6 % — ABNORMAL HIGH (ref 11.2–14.5)

## 2011-02-15 LAB — BASIC METABOLIC PANEL
BUN: 11 mg/dL (ref 6–23)
Calcium: 9 mg/dL (ref 8.4–10.5)
Potassium: 3.8 mEq/L (ref 3.5–5.3)
Sodium: 137 mEq/L (ref 135–145)

## 2011-02-19 ENCOUNTER — Ambulatory Visit: Payer: Medicare Other | Admitting: Physical Therapy

## 2011-02-21 ENCOUNTER — Other Ambulatory Visit: Payer: Self-pay | Admitting: Oncology

## 2011-02-21 ENCOUNTER — Ambulatory Visit: Payer: Medicare Other | Attending: Oncology | Admitting: Physical Therapy

## 2011-02-21 ENCOUNTER — Encounter (HOSPITAL_BASED_OUTPATIENT_CLINIC_OR_DEPARTMENT_OTHER): Payer: Medicare Other | Admitting: Oncology

## 2011-02-21 DIAGNOSIS — M24519 Contracture, unspecified shoulder: Secondary | ICD-10-CM | POA: Insufficient documentation

## 2011-02-21 DIAGNOSIS — IMO0001 Reserved for inherently not codable concepts without codable children: Secondary | ICD-10-CM | POA: Insufficient documentation

## 2011-02-21 DIAGNOSIS — C50919 Malignant neoplasm of unspecified site of unspecified female breast: Secondary | ICD-10-CM

## 2011-02-21 DIAGNOSIS — Z170 Estrogen receptor positive status [ER+]: Secondary | ICD-10-CM

## 2011-02-21 DIAGNOSIS — I890 Lymphedema, not elsewhere classified: Secondary | ICD-10-CM | POA: Insufficient documentation

## 2011-02-21 LAB — CBC WITH DIFFERENTIAL/PLATELET
BASO%: 1.6 % (ref 0.0–2.0)
Eosinophils Absolute: 0.1 10*3/uL (ref 0.0–0.5)
HCT: 34 % — ABNORMAL LOW (ref 34.8–46.6)
LYMPH%: 30.3 % (ref 14.0–49.7)
MCHC: 34.2 g/dL (ref 31.5–36.0)
MONO#: 0.5 10*3/uL (ref 0.1–0.9)
NEUT#: 3 10*3/uL (ref 1.5–6.5)
Platelets: 149 10*3/uL (ref 145–400)
RBC: 3.89 10*6/uL (ref 3.70–5.45)
WBC: 5.4 10*3/uL (ref 3.9–10.3)
lymph#: 1.6 10*3/uL (ref 0.9–3.3)

## 2011-02-21 LAB — COMPREHENSIVE METABOLIC PANEL
ALT: 19 U/L (ref 0–35)
Albumin: 3.8 g/dL (ref 3.5–5.2)
CO2: 30 mEq/L (ref 19–32)
Calcium: 9.3 mg/dL (ref 8.4–10.5)
Chloride: 101 mEq/L (ref 96–112)
Glucose, Bld: 84 mg/dL (ref 70–99)
Sodium: 137 mEq/L (ref 135–145)
Total Bilirubin: 0.2 mg/dL — ABNORMAL LOW (ref 0.3–1.2)
Total Protein: 6.6 g/dL (ref 6.0–8.3)

## 2011-02-22 ENCOUNTER — Encounter (HOSPITAL_BASED_OUTPATIENT_CLINIC_OR_DEPARTMENT_OTHER): Payer: Medicare Other | Admitting: Oncology

## 2011-02-22 DIAGNOSIS — C50919 Malignant neoplasm of unspecified site of unspecified female breast: Secondary | ICD-10-CM

## 2011-02-22 DIAGNOSIS — Z5111 Encounter for antineoplastic chemotherapy: Secondary | ICD-10-CM

## 2011-02-23 ENCOUNTER — Encounter (HOSPITAL_BASED_OUTPATIENT_CLINIC_OR_DEPARTMENT_OTHER): Payer: Medicare Other | Admitting: Oncology

## 2011-02-23 DIAGNOSIS — C50919 Malignant neoplasm of unspecified site of unspecified female breast: Secondary | ICD-10-CM

## 2011-02-26 ENCOUNTER — Ambulatory Visit: Payer: Medicare Other | Admitting: Physical Therapy

## 2011-02-28 ENCOUNTER — Ambulatory Visit: Payer: Medicare Other | Admitting: Physical Therapy

## 2011-02-28 ENCOUNTER — Encounter (INDEPENDENT_AMBULATORY_CARE_PROVIDER_SITE_OTHER): Payer: Self-pay

## 2011-03-01 ENCOUNTER — Encounter (HOSPITAL_BASED_OUTPATIENT_CLINIC_OR_DEPARTMENT_OTHER): Payer: Medicare Other | Admitting: Oncology

## 2011-03-01 ENCOUNTER — Other Ambulatory Visit: Payer: Self-pay | Admitting: Oncology

## 2011-03-01 DIAGNOSIS — C50919 Malignant neoplasm of unspecified site of unspecified female breast: Secondary | ICD-10-CM

## 2011-03-01 DIAGNOSIS — Z170 Estrogen receptor positive status [ER+]: Secondary | ICD-10-CM

## 2011-03-01 DIAGNOSIS — D72819 Decreased white blood cell count, unspecified: Secondary | ICD-10-CM

## 2011-03-01 DIAGNOSIS — D709 Neutropenia, unspecified: Secondary | ICD-10-CM

## 2011-03-01 LAB — CBC WITH DIFFERENTIAL/PLATELET
BASO%: 2 % (ref 0.0–2.0)
Eosinophils Absolute: 0 10*3/uL (ref 0.0–0.5)
LYMPH%: 41 % (ref 14.0–49.7)
MCHC: 32.7 g/dL (ref 31.5–36.0)
MONO#: 0.2 10*3/uL (ref 0.1–0.9)
NEUT#: 0.9 10*3/uL — ABNORMAL LOW (ref 1.5–6.5)
RBC: 3.8 10*6/uL (ref 3.70–5.45)
RDW: 15.4 % — ABNORMAL HIGH (ref 11.2–14.5)
WBC: 2 10*3/uL — ABNORMAL LOW (ref 3.9–10.3)
lymph#: 0.8 10*3/uL — ABNORMAL LOW (ref 0.9–3.3)
nRBC: 0 % (ref 0–0)

## 2011-03-01 LAB — COMPREHENSIVE METABOLIC PANEL
ALT: 15 U/L (ref 0–35)
AST: 12 U/L (ref 0–37)
CO2: 27 mEq/L (ref 19–32)
Calcium: 8.6 mg/dL (ref 8.4–10.5)
Chloride: 102 mEq/L (ref 96–112)
Creatinine, Ser: 0.67 mg/dL (ref 0.50–1.10)
Potassium: 4.2 mEq/L (ref 3.5–5.3)
Sodium: 141 mEq/L (ref 135–145)
Total Protein: 5.7 g/dL — ABNORMAL LOW (ref 6.0–8.3)

## 2011-03-05 ENCOUNTER — Encounter (INDEPENDENT_AMBULATORY_CARE_PROVIDER_SITE_OTHER): Payer: Medicare Other | Admitting: General Surgery

## 2011-03-08 ENCOUNTER — Encounter (HOSPITAL_BASED_OUTPATIENT_CLINIC_OR_DEPARTMENT_OTHER): Payer: Medicare Other | Admitting: Oncology

## 2011-03-08 ENCOUNTER — Other Ambulatory Visit: Payer: Self-pay | Admitting: Oncology

## 2011-03-08 DIAGNOSIS — C50919 Malignant neoplasm of unspecified site of unspecified female breast: Secondary | ICD-10-CM

## 2011-03-08 DIAGNOSIS — Z5111 Encounter for antineoplastic chemotherapy: Secondary | ICD-10-CM

## 2011-03-08 LAB — CBC WITH DIFFERENTIAL/PLATELET
BASO%: 0.4 % (ref 0.0–2.0)
EOS%: 0.4 % (ref 0.0–7.0)
HCT: 32.1 % — ABNORMAL LOW (ref 34.8–46.6)
LYMPH%: 19.8 % (ref 14.0–49.7)
MCH: 30.5 pg (ref 25.1–34.0)
MCHC: 34.5 g/dL (ref 31.5–36.0)
MONO%: 8.8 % (ref 0.0–14.0)
NEUT%: 70.6 % (ref 38.4–76.8)
Platelets: 159 10*3/uL (ref 145–400)
RBC: 3.64 10*6/uL — ABNORMAL LOW (ref 3.70–5.45)
WBC: 7.1 10*3/uL (ref 3.9–10.3)

## 2011-03-08 LAB — COMPREHENSIVE METABOLIC PANEL
ALT: 19 U/L (ref 0–35)
AST: 18 U/L (ref 0–37)
Alkaline Phosphatase: 76 U/L (ref 39–117)
CO2: 27 mEq/L (ref 19–32)
Creatinine, Ser: 0.63 mg/dL (ref 0.50–1.10)
Sodium: 138 mEq/L (ref 135–145)
Total Bilirubin: 0.2 mg/dL — ABNORMAL LOW (ref 0.3–1.2)
Total Protein: 6.3 g/dL (ref 6.0–8.3)

## 2011-03-09 ENCOUNTER — Encounter (HOSPITAL_BASED_OUTPATIENT_CLINIC_OR_DEPARTMENT_OTHER): Payer: Medicare Other | Admitting: Oncology

## 2011-03-09 DIAGNOSIS — C50919 Malignant neoplasm of unspecified site of unspecified female breast: Secondary | ICD-10-CM

## 2011-03-16 ENCOUNTER — Other Ambulatory Visit: Payer: Self-pay | Admitting: Oncology

## 2011-03-16 ENCOUNTER — Encounter (HOSPITAL_BASED_OUTPATIENT_CLINIC_OR_DEPARTMENT_OTHER): Payer: Medicare Other | Admitting: Oncology

## 2011-03-16 DIAGNOSIS — C50919 Malignant neoplasm of unspecified site of unspecified female breast: Secondary | ICD-10-CM

## 2011-03-16 LAB — CBC WITH DIFFERENTIAL/PLATELET
BASO%: 1.3 % (ref 0.0–2.0)
HCT: 30.5 % — ABNORMAL LOW (ref 34.8–46.6)
LYMPH%: 49.3 % (ref 14.0–49.7)
MCHC: 34.3 g/dL (ref 31.5–36.0)
MCV: 87.8 fL (ref 79.5–101.0)
MONO#: 0.3 10*3/uL (ref 0.1–0.9)
MONO%: 15 % — ABNORMAL HIGH (ref 0.0–14.0)
NEUT%: 33.5 % — ABNORMAL LOW (ref 38.4–76.8)
Platelets: 178 10*3/uL (ref 145–400)
WBC: 1.7 10*3/uL — ABNORMAL LOW (ref 3.9–10.3)

## 2011-03-16 LAB — BASIC METABOLIC PANEL
CO2: 27 mEq/L (ref 19–32)
Calcium: 9.4 mg/dL (ref 8.4–10.5)
Creatinine, Ser: 0.82 mg/dL (ref 0.50–1.10)
Glucose, Bld: 106 mg/dL — ABNORMAL HIGH (ref 70–99)

## 2011-03-22 ENCOUNTER — Encounter (HOSPITAL_BASED_OUTPATIENT_CLINIC_OR_DEPARTMENT_OTHER): Payer: Medicare Other | Admitting: Oncology

## 2011-03-22 ENCOUNTER — Other Ambulatory Visit: Payer: Self-pay | Admitting: Oncology

## 2011-03-22 DIAGNOSIS — C50919 Malignant neoplasm of unspecified site of unspecified female breast: Secondary | ICD-10-CM

## 2011-03-22 DIAGNOSIS — Z5111 Encounter for antineoplastic chemotherapy: Secondary | ICD-10-CM

## 2011-03-22 LAB — COMPREHENSIVE METABOLIC PANEL
AST: 20 U/L (ref 0–37)
Albumin: 3.7 g/dL (ref 3.5–5.2)
Alkaline Phosphatase: 76 U/L (ref 39–117)
BUN: 11 mg/dL (ref 6–23)
Potassium: 3.4 mEq/L — ABNORMAL LOW (ref 3.5–5.3)
Total Bilirubin: 0.3 mg/dL (ref 0.3–1.2)

## 2011-03-22 LAB — CBC WITH DIFFERENTIAL/PLATELET
Basophils Absolute: 0.1 10*3/uL (ref 0.0–0.1)
Eosinophils Absolute: 0 10*3/uL (ref 0.0–0.5)
HGB: 11.2 g/dL — ABNORMAL LOW (ref 11.6–15.9)
LYMPH%: 16.5 % (ref 14.0–49.7)
MCV: 90 fL (ref 79.5–101.0)
MONO#: 0.7 10*3/uL (ref 0.1–0.9)
MONO%: 11.1 % (ref 0.0–14.0)
NEUT%: 70.2 % (ref 38.4–76.8)
RBC: 3.61 10*6/uL — ABNORMAL LOW (ref 3.70–5.45)
WBC: 6.3 10*3/uL (ref 3.9–10.3)

## 2011-03-23 ENCOUNTER — Encounter (HOSPITAL_BASED_OUTPATIENT_CLINIC_OR_DEPARTMENT_OTHER): Payer: Medicare Other | Admitting: Oncology

## 2011-03-23 DIAGNOSIS — C50919 Malignant neoplasm of unspecified site of unspecified female breast: Secondary | ICD-10-CM

## 2011-03-29 ENCOUNTER — Other Ambulatory Visit: Payer: Self-pay | Admitting: Oncology

## 2011-03-29 ENCOUNTER — Encounter (HOSPITAL_BASED_OUTPATIENT_CLINIC_OR_DEPARTMENT_OTHER): Payer: Medicare Other | Admitting: Oncology

## 2011-03-29 DIAGNOSIS — C50919 Malignant neoplasm of unspecified site of unspecified female breast: Secondary | ICD-10-CM

## 2011-03-29 DIAGNOSIS — C50219 Malignant neoplasm of upper-inner quadrant of unspecified female breast: Secondary | ICD-10-CM

## 2011-03-29 DIAGNOSIS — D709 Neutropenia, unspecified: Secondary | ICD-10-CM

## 2011-03-29 DIAGNOSIS — Z170 Estrogen receptor positive status [ER+]: Secondary | ICD-10-CM

## 2011-03-29 LAB — CBC WITH DIFFERENTIAL/PLATELET
Basophils Absolute: 0 10*3/uL (ref 0.0–0.1)
EOS%: 1.6 % (ref 0.0–7.0)
Eosinophils Absolute: 0 10*3/uL (ref 0.0–0.5)
HCT: 30.2 % — ABNORMAL LOW (ref 34.8–46.6)
HGB: 10.5 g/dL — ABNORMAL LOW (ref 11.6–15.9)
LYMPH%: 38.8 % (ref 14.0–49.7)
MCH: 31 pg (ref 25.1–34.0)
MCV: 89 fL (ref 79.5–101.0)
MONO%: 4.6 % (ref 0.0–14.0)
NEUT%: 53.7 % (ref 38.4–76.8)
Platelets: 157 10*3/uL (ref 145–400)
RDW: 20.9 % — ABNORMAL HIGH (ref 11.2–14.5)

## 2011-03-29 LAB — BASIC METABOLIC PANEL
BUN: 12 mg/dL (ref 6–23)
Creatinine, Ser: 0.64 mg/dL (ref 0.50–1.10)
Glucose, Bld: 93 mg/dL (ref 70–99)

## 2011-04-03 ENCOUNTER — Encounter (HOSPITAL_BASED_OUTPATIENT_CLINIC_OR_DEPARTMENT_OTHER): Payer: Medicare Other | Admitting: Oncology

## 2011-04-03 ENCOUNTER — Other Ambulatory Visit: Payer: Self-pay | Admitting: Oncology

## 2011-04-03 DIAGNOSIS — Z170 Estrogen receptor positive status [ER+]: Secondary | ICD-10-CM

## 2011-04-03 DIAGNOSIS — C50219 Malignant neoplasm of upper-inner quadrant of unspecified female breast: Secondary | ICD-10-CM

## 2011-04-03 DIAGNOSIS — Z9010 Acquired absence of unspecified breast and nipple: Secondary | ICD-10-CM

## 2011-04-03 LAB — CBC WITH DIFFERENTIAL/PLATELET
BASO%: 0.3 % (ref 0.0–2.0)
Eosinophils Absolute: 0 10*3/uL (ref 0.0–0.5)
MCHC: 34.2 g/dL (ref 31.5–36.0)
MONO#: 0.6 10*3/uL (ref 0.1–0.9)
MONO%: 8 % (ref 0.0–14.0)
NEUT#: 6.1 10*3/uL (ref 1.5–6.5)
RBC: 3.27 10*6/uL — ABNORMAL LOW (ref 3.70–5.45)
RDW: 23.1 % — ABNORMAL HIGH (ref 11.2–14.5)
WBC: 7.7 10*3/uL (ref 3.9–10.3)

## 2011-04-03 LAB — COMPREHENSIVE METABOLIC PANEL
ALT: 19 U/L (ref 0–35)
Albumin: 3.6 g/dL (ref 3.5–5.2)
Alkaline Phosphatase: 79 U/L (ref 39–117)
CO2: 30 mEq/L (ref 19–32)
Glucose, Bld: 93 mg/dL (ref 70–99)
Potassium: 4 mEq/L (ref 3.5–5.3)
Sodium: 139 mEq/L (ref 135–145)
Total Bilirubin: 0.1 mg/dL — ABNORMAL LOW (ref 0.3–1.2)
Total Protein: 6.6 g/dL (ref 6.0–8.3)

## 2011-04-05 ENCOUNTER — Encounter (HOSPITAL_BASED_OUTPATIENT_CLINIC_OR_DEPARTMENT_OTHER): Payer: Medicare Other | Admitting: Oncology

## 2011-04-05 DIAGNOSIS — C50919 Malignant neoplasm of unspecified site of unspecified female breast: Secondary | ICD-10-CM

## 2011-04-05 DIAGNOSIS — Z5111 Encounter for antineoplastic chemotherapy: Secondary | ICD-10-CM

## 2011-04-12 ENCOUNTER — Other Ambulatory Visit: Payer: Self-pay | Admitting: Oncology

## 2011-04-12 ENCOUNTER — Encounter (HOSPITAL_BASED_OUTPATIENT_CLINIC_OR_DEPARTMENT_OTHER): Payer: Medicare Other | Admitting: Oncology

## 2011-04-12 DIAGNOSIS — C50919 Malignant neoplasm of unspecified site of unspecified female breast: Secondary | ICD-10-CM

## 2011-04-12 DIAGNOSIS — Z170 Estrogen receptor positive status [ER+]: Secondary | ICD-10-CM

## 2011-04-12 DIAGNOSIS — C50219 Malignant neoplasm of upper-inner quadrant of unspecified female breast: Secondary | ICD-10-CM

## 2011-04-12 DIAGNOSIS — Z5111 Encounter for antineoplastic chemotherapy: Secondary | ICD-10-CM

## 2011-04-12 LAB — CBC WITH DIFFERENTIAL/PLATELET
BASO%: 1.1 % (ref 0.0–2.0)
Eosinophils Absolute: 0.1 10*3/uL (ref 0.0–0.5)
LYMPH%: 17.1 % (ref 14.0–49.7)
MCHC: 34 g/dL (ref 31.5–36.0)
MONO#: 0.5 10*3/uL (ref 0.1–0.9)
NEUT#: 3.3 10*3/uL (ref 1.5–6.5)
RBC: 3.28 10*6/uL — ABNORMAL LOW (ref 3.70–5.45)
RDW: 22.4 % — ABNORMAL HIGH (ref 11.2–14.5)
WBC: 4.7 10*3/uL (ref 3.9–10.3)
lymph#: 0.8 10*3/uL — ABNORMAL LOW (ref 0.9–3.3)
nRBC: 0 % (ref 0–0)

## 2011-04-12 LAB — COMPREHENSIVE METABOLIC PANEL
ALT: 19 U/L (ref 0–35)
AST: 15 U/L (ref 0–37)
CO2: 25 mEq/L (ref 19–32)
Calcium: 8.4 mg/dL (ref 8.4–10.5)
Chloride: 104 mEq/L (ref 96–112)
Creatinine, Ser: 0.67 mg/dL (ref 0.50–1.10)
Potassium: 4 mEq/L (ref 3.5–5.3)
Sodium: 139 mEq/L (ref 135–145)
Total Protein: 5.6 g/dL — ABNORMAL LOW (ref 6.0–8.3)

## 2011-04-19 ENCOUNTER — Encounter (HOSPITAL_BASED_OUTPATIENT_CLINIC_OR_DEPARTMENT_OTHER): Payer: Medicare Other | Admitting: Oncology

## 2011-04-19 ENCOUNTER — Other Ambulatory Visit: Payer: Self-pay | Admitting: Oncology

## 2011-04-19 DIAGNOSIS — Z5189 Encounter for other specified aftercare: Secondary | ICD-10-CM

## 2011-04-19 DIAGNOSIS — C50919 Malignant neoplasm of unspecified site of unspecified female breast: Secondary | ICD-10-CM

## 2011-04-19 DIAGNOSIS — C50219 Malignant neoplasm of upper-inner quadrant of unspecified female breast: Secondary | ICD-10-CM

## 2011-04-19 DIAGNOSIS — Z5111 Encounter for antineoplastic chemotherapy: Secondary | ICD-10-CM

## 2011-04-19 LAB — CBC WITH DIFFERENTIAL/PLATELET
BASO%: 2.8 % — ABNORMAL HIGH (ref 0.0–2.0)
EOS%: 5.6 % (ref 0.0–7.0)
HCT: 30.5 % — ABNORMAL LOW (ref 34.8–46.6)
LYMPH%: 27.2 % (ref 14.0–49.7)
MCH: 30.4 pg (ref 25.1–34.0)
MCHC: 32.8 g/dL (ref 31.5–36.0)
NEUT%: 47 % (ref 38.4–76.8)
RBC: 3.29 10*6/uL — ABNORMAL LOW (ref 3.70–5.45)
lymph#: 0.6 10*3/uL — ABNORMAL LOW (ref 0.9–3.3)
nRBC: 0 % (ref 0–0)

## 2011-04-26 ENCOUNTER — Other Ambulatory Visit: Payer: Self-pay | Admitting: Oncology

## 2011-04-26 ENCOUNTER — Encounter (HOSPITAL_BASED_OUTPATIENT_CLINIC_OR_DEPARTMENT_OTHER): Payer: Medicare Other | Admitting: Oncology

## 2011-04-26 DIAGNOSIS — Z5189 Encounter for other specified aftercare: Secondary | ICD-10-CM

## 2011-04-26 DIAGNOSIS — Z5111 Encounter for antineoplastic chemotherapy: Secondary | ICD-10-CM

## 2011-04-26 DIAGNOSIS — C50919 Malignant neoplasm of unspecified site of unspecified female breast: Secondary | ICD-10-CM

## 2011-04-26 DIAGNOSIS — Z170 Estrogen receptor positive status [ER+]: Secondary | ICD-10-CM

## 2011-04-26 DIAGNOSIS — D702 Other drug-induced agranulocytosis: Secondary | ICD-10-CM

## 2011-04-26 DIAGNOSIS — C50219 Malignant neoplasm of upper-inner quadrant of unspecified female breast: Secondary | ICD-10-CM

## 2011-04-26 LAB — CBC WITH DIFFERENTIAL/PLATELET
EOS%: 8 % — ABNORMAL HIGH (ref 0.0–7.0)
MCH: 30.6 pg (ref 25.1–34.0)
MCV: 91.8 fL (ref 79.5–101.0)
MONO%: 24.8 % — ABNORMAL HIGH (ref 0.0–14.0)
NEUT#: 0.1 10*3/uL — CL (ref 1.5–6.5)
RBC: 3.43 10*6/uL — ABNORMAL LOW (ref 3.70–5.45)
RDW: 21.3 % — ABNORMAL HIGH (ref 11.2–14.5)
nRBC: 0 % (ref 0–0)

## 2011-04-27 ENCOUNTER — Encounter (HOSPITAL_BASED_OUTPATIENT_CLINIC_OR_DEPARTMENT_OTHER): Payer: Medicare Other | Admitting: Oncology

## 2011-04-28 ENCOUNTER — Encounter: Payer: Medicare Other | Admitting: Oncology

## 2011-04-28 DIAGNOSIS — C50219 Malignant neoplasm of upper-inner quadrant of unspecified female breast: Secondary | ICD-10-CM

## 2011-04-28 DIAGNOSIS — D702 Other drug-induced agranulocytosis: Secondary | ICD-10-CM

## 2011-04-29 ENCOUNTER — Encounter: Payer: Medicare Other | Admitting: Oncology

## 2011-04-29 DIAGNOSIS — C50919 Malignant neoplasm of unspecified site of unspecified female breast: Secondary | ICD-10-CM

## 2011-04-29 DIAGNOSIS — D709 Neutropenia, unspecified: Secondary | ICD-10-CM

## 2011-04-30 ENCOUNTER — Encounter (HOSPITAL_BASED_OUTPATIENT_CLINIC_OR_DEPARTMENT_OTHER): Payer: Medicare Other | Admitting: Oncology

## 2011-04-30 DIAGNOSIS — C50219 Malignant neoplasm of upper-inner quadrant of unspecified female breast: Secondary | ICD-10-CM

## 2011-04-30 DIAGNOSIS — D702 Other drug-induced agranulocytosis: Secondary | ICD-10-CM

## 2011-05-03 ENCOUNTER — Telehealth (INDEPENDENT_AMBULATORY_CARE_PROVIDER_SITE_OTHER): Payer: Self-pay | Admitting: General Surgery

## 2011-05-03 ENCOUNTER — Encounter (HOSPITAL_BASED_OUTPATIENT_CLINIC_OR_DEPARTMENT_OTHER): Payer: Medicare Other | Admitting: Oncology

## 2011-05-03 ENCOUNTER — Other Ambulatory Visit: Payer: Self-pay | Admitting: Oncology

## 2011-05-03 ENCOUNTER — Ambulatory Visit: Payer: Self-pay | Admitting: Internal Medicine

## 2011-05-03 DIAGNOSIS — Z5111 Encounter for antineoplastic chemotherapy: Secondary | ICD-10-CM

## 2011-05-03 DIAGNOSIS — C50919 Malignant neoplasm of unspecified site of unspecified female breast: Secondary | ICD-10-CM

## 2011-05-03 DIAGNOSIS — C50219 Malignant neoplasm of upper-inner quadrant of unspecified female breast: Secondary | ICD-10-CM

## 2011-05-03 LAB — CBC WITH DIFFERENTIAL/PLATELET
BASO%: 0.7 % (ref 0.0–2.0)
LYMPH%: 29.6 % (ref 14.0–49.7)
MCHC: 32.6 g/dL (ref 31.5–36.0)
MONO#: 1.5 10*3/uL — ABNORMAL HIGH (ref 0.1–0.9)
RBC: 3.83 10*6/uL (ref 3.70–5.45)
RDW: 21 % — ABNORMAL HIGH (ref 11.2–14.5)
WBC: 6.7 10*3/uL (ref 3.9–10.3)
lymph#: 2 10*3/uL (ref 0.9–3.3)

## 2011-05-03 LAB — COMPREHENSIVE METABOLIC PANEL
AST: 28 U/L (ref 0–37)
Albumin: 3.4 g/dL — ABNORMAL LOW (ref 3.5–5.2)
BUN: 7 mg/dL (ref 6–23)
Calcium: 9.2 mg/dL (ref 8.4–10.5)
Chloride: 101 mEq/L (ref 96–112)
Potassium: 3.9 mEq/L (ref 3.5–5.3)

## 2011-05-10 ENCOUNTER — Encounter (HOSPITAL_BASED_OUTPATIENT_CLINIC_OR_DEPARTMENT_OTHER): Payer: Medicare Other | Admitting: Oncology

## 2011-05-10 ENCOUNTER — Other Ambulatory Visit: Payer: Self-pay | Admitting: Oncology

## 2011-05-10 DIAGNOSIS — C50919 Malignant neoplasm of unspecified site of unspecified female breast: Secondary | ICD-10-CM

## 2011-05-10 DIAGNOSIS — Z5111 Encounter for antineoplastic chemotherapy: Secondary | ICD-10-CM

## 2011-05-10 DIAGNOSIS — C50219 Malignant neoplasm of upper-inner quadrant of unspecified female breast: Secondary | ICD-10-CM

## 2011-05-10 LAB — CBC WITH DIFFERENTIAL/PLATELET
BASO%: 0.2 % (ref 0.0–2.0)
Basophils Absolute: 0 10*3/uL (ref 0.0–0.1)
EOS%: 5.9 % (ref 0.0–7.0)
HGB: 11.7 g/dL (ref 11.6–15.9)
MCH: 32.7 pg (ref 25.1–34.0)
MCHC: 33.8 g/dL (ref 31.5–36.0)
RDW: 22.1 % — ABNORMAL HIGH (ref 11.2–14.5)
lymph#: 1.6 10*3/uL (ref 0.9–3.3)

## 2011-05-10 LAB — COMPREHENSIVE METABOLIC PANEL
ALT: 24 U/L (ref 0–35)
AST: 17 U/L (ref 0–37)
Albumin: 3.6 g/dL (ref 3.5–5.2)
Calcium: 9.2 mg/dL (ref 8.4–10.5)
Chloride: 100 mEq/L (ref 96–112)
Potassium: 4 mEq/L (ref 3.5–5.3)
Total Protein: 6.5 g/dL (ref 6.0–8.3)

## 2011-05-11 ENCOUNTER — Encounter (HOSPITAL_BASED_OUTPATIENT_CLINIC_OR_DEPARTMENT_OTHER): Payer: Medicare Other | Admitting: Oncology

## 2011-05-12 ENCOUNTER — Encounter: Payer: Medicare Other | Admitting: Oncology

## 2011-05-12 DIAGNOSIS — D702 Other drug-induced agranulocytosis: Secondary | ICD-10-CM

## 2011-05-12 DIAGNOSIS — C50219 Malignant neoplasm of upper-inner quadrant of unspecified female breast: Secondary | ICD-10-CM

## 2011-05-13 ENCOUNTER — Encounter: Payer: Medicare Other | Admitting: Oncology

## 2011-05-14 ENCOUNTER — Encounter (HOSPITAL_BASED_OUTPATIENT_CLINIC_OR_DEPARTMENT_OTHER): Payer: Medicare Other | Admitting: Oncology

## 2011-05-14 DIAGNOSIS — C50219 Malignant neoplasm of upper-inner quadrant of unspecified female breast: Secondary | ICD-10-CM

## 2011-05-14 DIAGNOSIS — D702 Other drug-induced agranulocytosis: Secondary | ICD-10-CM

## 2011-05-15 ENCOUNTER — Encounter (HOSPITAL_BASED_OUTPATIENT_CLINIC_OR_DEPARTMENT_OTHER): Payer: Medicare Other | Admitting: Oncology

## 2011-05-15 DIAGNOSIS — D702 Other drug-induced agranulocytosis: Secondary | ICD-10-CM

## 2011-05-15 DIAGNOSIS — C50219 Malignant neoplasm of upper-inner quadrant of unspecified female breast: Secondary | ICD-10-CM

## 2011-05-16 ENCOUNTER — Encounter (HOSPITAL_BASED_OUTPATIENT_CLINIC_OR_DEPARTMENT_OTHER): Payer: Medicare Other | Admitting: Oncology

## 2011-05-16 ENCOUNTER — Other Ambulatory Visit: Payer: Self-pay | Admitting: Physician Assistant

## 2011-05-16 DIAGNOSIS — C50219 Malignant neoplasm of upper-inner quadrant of unspecified female breast: Secondary | ICD-10-CM

## 2011-05-16 DIAGNOSIS — Z5189 Encounter for other specified aftercare: Secondary | ICD-10-CM

## 2011-05-16 DIAGNOSIS — Z170 Estrogen receptor positive status [ER+]: Secondary | ICD-10-CM

## 2011-05-16 DIAGNOSIS — C50919 Malignant neoplasm of unspecified site of unspecified female breast: Secondary | ICD-10-CM

## 2011-05-16 DIAGNOSIS — Z5111 Encounter for antineoplastic chemotherapy: Secondary | ICD-10-CM

## 2011-05-16 DIAGNOSIS — IMO0001 Reserved for inherently not codable concepts without codable children: Secondary | ICD-10-CM

## 2011-05-16 LAB — CBC WITH DIFFERENTIAL/PLATELET
BASO%: 0.1 % (ref 0.0–2.0)
EOS%: 1.9 % (ref 0.0–7.0)
MCH: 33.6 pg (ref 25.1–34.0)
MCHC: 34.1 g/dL (ref 31.5–36.0)
RDW: 21.5 % — ABNORMAL HIGH (ref 11.2–14.5)
lymph#: 1.5 10*3/uL (ref 0.9–3.3)

## 2011-05-16 LAB — COMPREHENSIVE METABOLIC PANEL
ALT: 28 U/L (ref 0–35)
AST: 22 U/L (ref 0–37)
Alkaline Phosphatase: 149 U/L — ABNORMAL HIGH (ref 39–117)
Calcium: 9 mg/dL (ref 8.4–10.5)
Chloride: 104 mEq/L (ref 96–112)
Creatinine, Ser: 0.66 mg/dL (ref 0.50–1.10)
Potassium: 4.1 mEq/L (ref 3.5–5.3)

## 2011-05-17 ENCOUNTER — Ambulatory Visit (INDEPENDENT_AMBULATORY_CARE_PROVIDER_SITE_OTHER): Payer: Medicare Other | Admitting: General Surgery

## 2011-05-17 ENCOUNTER — Encounter (INDEPENDENT_AMBULATORY_CARE_PROVIDER_SITE_OTHER): Payer: Self-pay

## 2011-05-17 ENCOUNTER — Encounter (HOSPITAL_BASED_OUTPATIENT_CLINIC_OR_DEPARTMENT_OTHER): Payer: Medicare Other | Admitting: Oncology

## 2011-05-17 VITALS — BP 142/74 | HR 68 | Temp 98.8°F | Resp 16 | Ht 63.0 in | Wt 174.0 lb

## 2011-05-17 DIAGNOSIS — C50919 Malignant neoplasm of unspecified site of unspecified female breast: Secondary | ICD-10-CM

## 2011-05-17 DIAGNOSIS — Z5111 Encounter for antineoplastic chemotherapy: Secondary | ICD-10-CM

## 2011-05-17 DIAGNOSIS — C50219 Malignant neoplasm of upper-inner quadrant of unspecified female breast: Secondary | ICD-10-CM

## 2011-05-17 NOTE — Progress Notes (Signed)
Operation: Bilateral mastectomies the left of which was a modified radical mastectomy.  Date:  12/28/2010  Stage: TxN1   HPI:  Krystal [name] is here for followup of her left breast cancer. She currently is in her going chemotherapy. She states that she has some swelling in both the chest wall areas. She gets a moist rash on the inferior chest wall at times. No adenopathy  PE: Gen.-well-developed well-nourished. She has alopecia  Chest-bilateral chest incision with no nodularity; redundant tissue and swelling right side greater than left side.  Lymph nodes-no palpable cervical, super clavicular, or axillary adenopathy.  Assessment:  Left breast cancer metastatic to lymph nodes-currently undergoing chemotherapy; still has some residual swelling and redundant skin the right side greater than left side.  Plan: Wear abdominal wrap to try to decrease swelling. Return visit 3 months. If she continues to have asymmetry and is concerned with this we can refer her to a Engineer, petroleum.

## 2011-05-17 NOTE — Patient Instructions (Signed)
Where abdominal wrap as we discussed.

## 2011-05-19 ENCOUNTER — Encounter: Payer: Self-pay | Admitting: *Deleted

## 2011-05-19 ENCOUNTER — Encounter: Payer: Medicare Other | Admitting: Oncology

## 2011-05-20 ENCOUNTER — Encounter: Payer: Medicare Other | Admitting: Oncology

## 2011-05-21 ENCOUNTER — Encounter (HOSPITAL_BASED_OUTPATIENT_CLINIC_OR_DEPARTMENT_OTHER): Payer: Medicare Other | Admitting: Oncology

## 2011-05-21 DIAGNOSIS — C50219 Malignant neoplasm of upper-inner quadrant of unspecified female breast: Secondary | ICD-10-CM

## 2011-05-22 ENCOUNTER — Encounter (HOSPITAL_BASED_OUTPATIENT_CLINIC_OR_DEPARTMENT_OTHER): Payer: Medicare Other | Admitting: Oncology

## 2011-05-22 DIAGNOSIS — Z5111 Encounter for antineoplastic chemotherapy: Secondary | ICD-10-CM

## 2011-05-22 DIAGNOSIS — C50219 Malignant neoplasm of upper-inner quadrant of unspecified female breast: Secondary | ICD-10-CM

## 2011-05-24 ENCOUNTER — Other Ambulatory Visit: Payer: Self-pay | Admitting: *Deleted

## 2011-05-24 ENCOUNTER — Encounter (HOSPITAL_BASED_OUTPATIENT_CLINIC_OR_DEPARTMENT_OTHER): Payer: Medicare Other | Admitting: Oncology

## 2011-05-24 ENCOUNTER — Other Ambulatory Visit: Payer: Self-pay | Admitting: Physician Assistant

## 2011-05-24 ENCOUNTER — Ambulatory Visit: Payer: Self-pay | Admitting: Internal Medicine

## 2011-05-24 DIAGNOSIS — Z5111 Encounter for antineoplastic chemotherapy: Secondary | ICD-10-CM

## 2011-05-24 DIAGNOSIS — C50919 Malignant neoplasm of unspecified site of unspecified female breast: Secondary | ICD-10-CM

## 2011-05-24 DIAGNOSIS — Z170 Estrogen receptor positive status [ER+]: Secondary | ICD-10-CM

## 2011-05-24 DIAGNOSIS — Z5189 Encounter for other specified aftercare: Secondary | ICD-10-CM

## 2011-05-24 DIAGNOSIS — C50219 Malignant neoplasm of upper-inner quadrant of unspecified female breast: Secondary | ICD-10-CM

## 2011-05-24 LAB — CBC WITH DIFFERENTIAL/PLATELET
BASO%: 1.3 % (ref 0.0–2.0)
Basophils Absolute: 0.2 10*3/uL — ABNORMAL HIGH (ref 0.0–0.1)
EOS%: 1.6 % (ref 0.0–7.0)
Eosinophils Absolute: 0.2 10*3/uL (ref 0.0–0.5)
HCT: 35 % (ref 34.8–46.6)
HGB: 11.5 g/dL — ABNORMAL LOW (ref 11.6–15.9)
LYMPH%: 14.9 % (ref 14.0–49.7)
MCH: 32.1 pg (ref 25.1–34.0)
MCHC: 32.9 g/dL (ref 31.5–36.0)
MCV: 97.8 fL (ref 79.5–101.0)
MONO#: 1.5 10*3/uL — ABNORMAL HIGH (ref 0.1–0.9)
MONO%: 11.5 % (ref 0.0–14.0)
NEUT#: 8.9 10*3/uL — ABNORMAL HIGH (ref 1.5–6.5)
NEUT%: 70.7 % (ref 38.4–76.8)
Platelets: 285 10*3/uL (ref 145–400)
RBC: 3.58 10*6/uL — ABNORMAL LOW (ref 3.70–5.45)
RDW: 17.8 % — ABNORMAL HIGH (ref 11.2–14.5)
WBC: 12.6 10*3/uL — ABNORMAL HIGH (ref 3.9–10.3)
lymph#: 1.9 10*3/uL (ref 0.9–3.3)
nRBC: 0 % (ref 0–0)

## 2011-05-24 LAB — COMPREHENSIVE METABOLIC PANEL
Albumin: 4.2 g/dL (ref 3.5–5.2)
BUN: 11 mg/dL (ref 6–23)
CO2: 25 mEq/L (ref 19–32)
Glucose, Bld: 139 mg/dL — ABNORMAL HIGH (ref 70–99)
Sodium: 137 mEq/L (ref 135–145)
Total Bilirubin: 0.4 mg/dL (ref 0.3–1.2)
Total Protein: 6.1 g/dL (ref 6.0–8.3)

## 2011-05-25 ENCOUNTER — Encounter (HOSPITAL_BASED_OUTPATIENT_CLINIC_OR_DEPARTMENT_OTHER): Payer: Medicare Other | Admitting: Oncology

## 2011-05-25 DIAGNOSIS — C50219 Malignant neoplasm of upper-inner quadrant of unspecified female breast: Secondary | ICD-10-CM

## 2011-05-26 ENCOUNTER — Encounter: Payer: Medicare Other | Admitting: Oncology

## 2011-05-27 ENCOUNTER — Ambulatory Visit: Payer: Medicare Other

## 2011-05-28 ENCOUNTER — Ambulatory Visit: Payer: Medicare Other

## 2011-05-28 NOTE — Progress Notes (Deleted)
CC:   Todd J. Rosenbower, M.D. [name] [name], MD Robert J Murray, M.D. Mary Wegenka, RN  DIAGNOSIS:  A 60-year-old [place], [place] woman with a history of a T1 N0 high-grade invasive ductal carcinoma of the left breast S/P left modified radical mastectomy with a prophylactic right simple mastectomy.  Tumor noted to be ER/PR positive, HER-2 negative with enrollment in the NSABP B49 clinical trial.  PAST THERAPY:  S/P 4 cycles of dose dense Adriamycin/Cytoxan with Neulasta support on day 2.  CURRENT THERAPY:  For week 6 of 12 planned single agent Taxol.  Neupogen providing granulocyte support due to neutropenia history.  SUBJECTIVE:  Ms. Wingert is seen today prior to week 6 of weekly Taxol. She is doing well overall, denying any unexplained fevers, chills, night sweats, shortness of breath, chest pain, nausea, emesis, diarrhea or constipation problems.  Her overall energy level is pretty good.  She denies any significant flare in her fibromyalgia.  She does wonder, though, about the possibility of decreasing the frequency of the Neupogen injections since she is trying to get out of town for a few days for a mental health break.  REVIEW OF SYSTEMS:  Otherwise negative.  ALLERGIES:  No known drug allergies.  CURRENT MEDICATIONS:  Reviewed with patient and as per EMR.  ECOG STATUS:  Zero.  PHYSICAL EXAMINATION:  Vital signs:  Blood pressure is 123/79, pulse 89, respirations 20, temp 98.6 weight 173.6 pounds.  HEENT:  Conjunctivae pink.  Sclerae anicteric.  Oropharynx is benign without any evidence of oral mucositis or candidiasis.  Lungs:  Clear to auscultation without wheezing or rhonchi.  Cardiac:  Heart is regular rate and rhythm without murmurs, rubs, gallops or clicks.  GI:  Abdomen is soft, nontender without organomegaly.  Normal bowel sounds.  Extremities:  No pedal edema.  Neurologic:  Nonfocal.  LABORATORY DATA:  Hemoglobin 11.5 grams, platelet count 285,000,  WBC 12,600 with an ANC of 8900.  IMPRESSION:  A 60-year-old [place], [place] woman with a history of a stage I, high-grade ER/PR positive, HER-2 negative infiltrating ductal carcinoma of the left breast for which she underwent a left modified radical mastectomy and prophylactic right mastectomy with participation in NSABP B49 protocol for which she has completed 4 cycles of Adriamycin/Cytoxan given in a dose dense fashion.  She is now receiving weekly Taxol, due for week 6/12 planned.  Due to her history of neutropenia, she has been receiving Neupogen injections intermittently.  Case has been reviewed with Dr. Khan.  PLAN:  Keela will receive treatment today as scheduled.  I have dose reduced her Decadron to 10 mg.  Pertaining to the Neupogen, we will only dose it on days 2 and 3, we will regroup in 1 week's time prior to week 7.  She knows to contact us sooner if the need should arise.    ______________________________ Christine Scherer, PA CS/MEDQ  D:  05/24/2011  T:  05/25/2011  Job:  [id number] 

## 2011-05-29 ENCOUNTER — Ambulatory Visit: Payer: Medicare Other

## 2011-05-31 ENCOUNTER — Encounter: Payer: Self-pay | Admitting: Physician Assistant

## 2011-05-31 ENCOUNTER — Encounter: Payer: Self-pay | Admitting: *Deleted

## 2011-05-31 ENCOUNTER — Other Ambulatory Visit: Payer: Self-pay | Admitting: *Deleted

## 2011-05-31 ENCOUNTER — Other Ambulatory Visit (HOSPITAL_BASED_OUTPATIENT_CLINIC_OR_DEPARTMENT_OTHER): Payer: Medicare Other | Admitting: Lab

## 2011-05-31 ENCOUNTER — Ambulatory Visit (HOSPITAL_BASED_OUTPATIENT_CLINIC_OR_DEPARTMENT_OTHER): Payer: Medicare Other | Admitting: Physician Assistant

## 2011-05-31 ENCOUNTER — Ambulatory Visit (HOSPITAL_BASED_OUTPATIENT_CLINIC_OR_DEPARTMENT_OTHER): Payer: Medicare Other

## 2011-05-31 VITALS — BP 104/70 | HR 81 | Temp 98.3°F | Ht 63.0 in | Wt 174.5 lb

## 2011-05-31 DIAGNOSIS — C50219 Malignant neoplasm of upper-inner quadrant of unspecified female breast: Secondary | ICD-10-CM

## 2011-05-31 DIAGNOSIS — C50919 Malignant neoplasm of unspecified site of unspecified female breast: Secondary | ICD-10-CM

## 2011-05-31 DIAGNOSIS — Z5111 Encounter for antineoplastic chemotherapy: Secondary | ICD-10-CM

## 2011-05-31 DIAGNOSIS — D709 Neutropenia, unspecified: Secondary | ICD-10-CM

## 2011-05-31 DIAGNOSIS — Z170 Estrogen receptor positive status [ER+]: Secondary | ICD-10-CM

## 2011-05-31 LAB — CBC WITH DIFFERENTIAL/PLATELET
Eosinophils Absolute: 0.1 10*3/uL (ref 0.0–0.5)
HGB: 11.4 g/dL — ABNORMAL LOW (ref 11.6–15.9)
MONO#: 0.2 10*3/uL (ref 0.1–0.9)
NEUT#: 3.5 10*3/uL (ref 1.5–6.5)
Platelets: 248 10*3/uL (ref 145–400)
RBC: 3.41 10*6/uL — ABNORMAL LOW (ref 3.70–5.45)
RDW: 18 % — ABNORMAL HIGH (ref 11.2–14.5)
WBC: 4.7 10*3/uL (ref 3.9–10.3)

## 2011-05-31 LAB — COMPREHENSIVE METABOLIC PANEL
Albumin: 3.3 g/dL — ABNORMAL LOW (ref 3.5–5.2)
CO2: 31 mEq/L (ref 19–32)
Calcium: 9 mg/dL (ref 8.4–10.5)
Chloride: 99 mEq/L (ref 96–112)
Glucose, Bld: 107 mg/dL — ABNORMAL HIGH (ref 70–99)
Potassium: 3.8 mEq/L (ref 3.5–5.3)
Sodium: 138 mEq/L (ref 135–145)
Total Protein: 5.6 g/dL — ABNORMAL LOW (ref 6.0–8.3)

## 2011-05-31 MED ORDER — SODIUM CHLORIDE 0.9 % IV SOLN
Freq: Once | INTRAVENOUS | Status: AC
  Administered 2011-05-31: 11:00:00 via INTRAVENOUS

## 2011-05-31 MED ORDER — SODIUM CHLORIDE 0.9 % IJ SOLN
10.0000 mL | INTRAMUSCULAR | Status: DC | PRN
  Administered 2011-05-31: 10 mL
  Filled 2011-05-31: qty 10

## 2011-05-31 MED ORDER — DEXAMETHASONE SODIUM PHOSPHATE 4 MG/ML IJ SOLN
10.0000 mg | Freq: Once | INTRAMUSCULAR | Status: AC
  Administered 2011-05-31: 10 mg via INTRAVENOUS

## 2011-05-31 MED ORDER — FAMOTIDINE IN NACL 20-0.9 MG/50ML-% IV SOLN
20.0000 mg | Freq: Once | INTRAVENOUS | Status: AC
  Administered 2011-05-31: 20 mg via INTRAVENOUS

## 2011-05-31 MED ORDER — HEPARIN SOD (PORK) LOCK FLUSH 100 UNIT/ML IV SOLN
500.0000 [IU] | Freq: Once | INTRAVENOUS | Status: AC | PRN
  Administered 2011-05-31: 500 [IU]
  Filled 2011-05-31: qty 5

## 2011-05-31 MED ORDER — DIPHENHYDRAMINE HCL 50 MG/ML IJ SOLN
50.0000 mg | Freq: Once | INTRAMUSCULAR | Status: AC
  Administered 2011-05-31: 50 mg via INTRAVENOUS

## 2011-05-31 MED ORDER — ONDANSETRON 8 MG/50ML IVPB (CHCC)
8.0000 mg | Freq: Once | INTRAVENOUS | Status: AC
  Administered 2011-05-31: 8 mg via INTRAVENOUS

## 2011-05-31 MED ORDER — DEXTROSE 5 % IV SOLN
80.0000 mg/m2 | Freq: Once | INTRAVENOUS | Status: AC
  Administered 2011-05-31: 144 mg via INTRAVENOUS
  Filled 2011-05-31: qty 24

## 2011-05-31 NOTE — Progress Notes (Signed)
NSABP B-49,  Cycle 3, day 1, Taxol #7 [name] is in the cancer center today for lab work and physical exam before receiving the 7th of 12 taxol doses. Her lab results are WNL for treatment. She did receive neupogen after her last taxol. She denies any adverse events. Her energy level remains high.   Sign for treatment given to L. Ave Filter, RN in infusion.   NSABP B-49, Cycle 3, day 8 taxol (#8) [name] is in the cancer center today for her 8th treatment with taxol. She denies any nausea, her energy level is normal. She denies any change or tingling in her fingers and toes. She says she has been knitting without any difficulty.   Her CBC is WNL for treatment.  She is scheduled to receive neupogen, beginning the day after treatment.  Sign for infusion given to V. Christell Constant, RN.   NSABP B-49, cycle 3, pre-day 15 taxol (#9) [name] is in the cancer center today for lab work and physical exam before receiving the 9th dose of taxol on B-49. Exam by Blake Divine, PA. Treatment has been scheduled for 11/23 D/T the Thanksgiving Holiday.   WBC WNL for treatment on Friday.

## 2011-05-31 NOTE — Patient Instructions (Signed)
Pt discharged ambulatory with next appointment confirmed.  Pt aware to call with any questions or concerns.  

## 2011-05-31 NOTE — Progress Notes (Deleted)
DIAGNOSIS:  A 60 year old [place], [name] with a history of a T1 N0 (stage I) high-grade invasive ductal carcinoma the left breast, status post left modified radical mastectomy with a prophylactic right mastectomy.  The tumor was ER/PR positive, HER-2 negative.  She is participating in NSABP B-49 clinical trial for which she has completed 4 cycles of dose-dense Adriamycin/Cytoxan.  CURRENT THERAPY:  For week 6 of 12 planned single-agent Taxol with Neupogen providing granulocyte support due to history of neutropenia.  SUBJECTIVE:  Ms. Klugh is seen today prior to week 7 of single-agent Taxol.  She is feeling well.  She is denying any unexplained fevers, chills, night sweats, shortness of breath, or chest pain.  No nausea, emesis, diarrhea, or constipation issues are noted.  She denies any neuropathy symptoms whatsoever.  She tolerates her Neupogen injections without difficulties.  REVIEW OF SYSTEMS:  Otherwise, negative.  ALLERGIES:  No known drug allergies.  CURRENT MEDICATIONS:  Reviewed with the patient and updated as per EMR.  PERFORMANCE STATUS:  ECOG status of zero.  PHYSICAL EXAMINATION:  Vital Signs:  Blood pressure is 104/70.  Pulse 81.  Respirations 20.  Temp 98.3.  Weight 174 pounds.  HEENT: Conjunctivae are pink.  Sclerae are anicteric.  Oropharynx is benign without oral mucositis or candidosis.  Neck:  No cervical or supraclavicular lymphadenopathy is present on exam.  Lungs:  Clear to auscultation without wheezing or rhonchi.  Heart:  Regular rate and rhythm without murmurs, rubs, gallops, or clicks.  Abdomen:  Soft and nontender without organomegaly.  Normal bowel sounds.  Extremities: Free of pedal edema.  Neurologic Exam:  Nonfocal.  LABORATORY DATA:  Hemoglobin 11.4 g, platelet count 248,000, WBC 4,700 with an ANC of 3,500.  Chemistry panel from today is pending.  IMPRESSION: 49. A 60 year old [place], [name] with a history of  a     stage I, high-grade ER/PR positive, HE-2 negative, infiltrating     ductal carcinoma of the left breast for which she underwent a left     modified radical mastectomy with node dissection and prophylactic     right mastectomy.  She is participating in NSABP B-49 protocol for     which she has completed 4 cycles of Adriamycin/Cytoxan and is due     for week 7 of 12 planned Taxol today. 2. History of neutropenia for which she receives Neupogen 480 mcg     between days 2-6. The case has been reviewed with Dr. Park Breed.  PLAN:  Alisyn will receive treatment today as scheduled.  Of note, her Decadron is decreased to 10 mg per Daphne Karrer preference.  She will return between days 2 and 6 receive Neupogen 480 mcg injections.  We will see her in 1 week's time prior to week 8 with appropriate pre-labs.  She knows to contact us sooner if the need should arise.    ______________________________ Sharyl Nimrod, PA CS/MEDQ  D:  05/31/2011  T:  05/31/2011  Job:  [id number]

## 2011-05-31 NOTE — Progress Notes (Signed)
Progress note dictated-CTS 

## 2011-06-01 ENCOUNTER — Ambulatory Visit (HOSPITAL_BASED_OUTPATIENT_CLINIC_OR_DEPARTMENT_OTHER): Payer: Medicare Other

## 2011-06-01 VITALS — BP 114/71 | HR 79 | Temp 98.9°F

## 2011-06-01 DIAGNOSIS — C50219 Malignant neoplasm of upper-inner quadrant of unspecified female breast: Secondary | ICD-10-CM

## 2011-06-01 DIAGNOSIS — C50919 Malignant neoplasm of unspecified site of unspecified female breast: Secondary | ICD-10-CM

## 2011-06-01 MED ORDER — FILGRASTIM 480 MCG/0.8ML IJ SOLN
480.0000 ug | Freq: Once | INTRAMUSCULAR | Status: AC
  Administered 2011-06-01: 480 ug via SUBCUTANEOUS
  Filled 2011-06-01: qty 0.8

## 2011-06-01 NOTE — Progress Notes (Deleted)
DIAGNOSIS:  A 60-year-old [place], [place] woman with a history of a T1 N0 (stage I) high-grade invasive ductal carcinoma the left breast, status post left modified radical mastectomy with a prophylactic right mastectomy.  The tumor was ER/PR positive, HER-2 negative.  She is participating in NSABP B-49 clinical trial for which she has completed 4 cycles of dose-dense Adriamycin/Cytoxan.  CURRENT THERAPY:  For week 6 of 12 planned single-agent Taxol with Neupogen providing granulocyte support due to history of neutropenia.  SUBJECTIVE:  Ms. Bittick is seen today prior to week 7 of single-agent Taxol.  She is feeling well.  She is denying any unexplained fevers, chills, night sweats, shortness of breath, or chest pain.  No nausea, emesis, diarrhea, or constipation issues are noted.  She denies any neuropathy symptoms whatsoever.  She tolerates her Neupogen injections without difficulties.  REVIEW OF SYSTEMS:  Otherwise, negative.  ALLERGIES:  No known drug allergies.  CURRENT MEDICATIONS:  Reviewed with the patient and updated as per EMR.  PERFORMANCE STATUS:  ECOG status of zero.  PHYSICAL EXAMINATION:  Vital Signs:  Blood pressure is 104/70.  Pulse 81.  Respirations 20.  Temp 98.3.  Weight 174 pounds.  HEENT: Conjunctivae are pink.  Sclerae are anicteric.  Oropharynx is benign without oral mucositis or candidosis.  Neck:  No cervical or supraclavicular lymphadenopathy is present on exam.  Lungs:  Clear to auscultation without wheezing or rhonchi.  Heart:  Regular rate and rhythm without murmurs, rubs, gallops, or clicks.  Abdomen:  Soft and nontender without organomegaly.  Normal bowel sounds.  Extremities: Free of pedal edema.  Neurologic Exam:  Nonfocal.  LABORATORY DATA:  Hemoglobin 11.4 g, platelet count 248,000, WBC 4,700 with an ANC of 3,500.  Chemistry panel from today is pending.  IMPRESSION: 1. A 60-year-old [place], [place] woman with a history of  a     stage I, high-grade ER/PR positive, HE-2 negative, infiltrating     ductal carcinoma of the left breast for which she underwent a left     modified radical mastectomy with node dissection and prophylactic     right mastectomy.  She is participating in NSABP B-49 protocol for     which she has completed 4 cycles of Adriamycin/Cytoxan and is due     for week 7 of 12 planned Taxol today. 2. History of neutropenia for which she receives Neupogen 480 mcg     between days 2-6. The case has been reviewed with Dr. Kahn.  PLAN:  Renaye will receive treatment today as scheduled.  Of note, her Decadron is decreased to 10 mg per provider preference.  She will return between days 2 and 6 receive Neupogen 480 mcg injections.  We will see her in 1 week's time prior to week 8 with appropriate pre-labs.  She knows to contact us sooner if the need should arise.    ______________________________ Neha Waight, PA CS/MEDQ  D:  05/31/2011  T:  05/31/2011  Job:  [id number] 

## 2011-06-01 NOTE — Progress Notes (Deleted)
CC:   Krystal [name], M.D. Krystal Mina, MD Krystal [name], M.D. Krystal Bonds, RN  DIAGNOSIS:  A 60 year old [place], [name] with a history of a T1 N0 high-grade invasive ductal carcinoma of the left breast S/P left modified radical mastectomy with a prophylactic right simple mastectomy.  Tumor noted to be ER/PR positive, HER-2 negative with enrollment in the NSABP B49 clinical trial.  PAST THERAPY:  S/P 4 cycles of dose dense Adriamycin/Cytoxan with Neulasta support on day 2.  CURRENT THERAPY:  For week 6 of 12 planned single agent Taxol.  Neupogen providing granulocyte support due to neutropenia history.  SUBJECTIVE:  Krystal [name] is seen today prior to week 6 of weekly Taxol. She is doing well overall, denying any unexplained fevers, chills, night sweats, shortness of breath, chest pain, nausea, emesis, diarrhea or constipation problems.  Her overall energy level is pretty good.  She denies any significant flare in her fibromyalgia.  She does wonder, though, about the possibility of decreasing the frequency of the Neupogen injections since she is trying to get out of town for a few days for a mental health break.  REVIEW OF SYSTEMS:  Otherwise negative.  ALLERGIES:  No known drug allergies.  CURRENT MEDICATIONS:  Reviewed with patient and as per EMR.  ECOG STATUS:  Zero.  PHYSICAL EXAMINATION:  Vital signs:  Blood pressure is 123/79, pulse 89, respirations 20, temp 98.6 weight 173.6 pounds.  HEENT:  Conjunctivae pink.  Sclerae anicteric.  Oropharynx is benign without any evidence of oral mucositis or candidiasis.  Lungs:  Clear to auscultation without wheezing or rhonchi.  Cardiac:  Heart is regular rate and rhythm without murmurs, rubs, gallops or clicks.  GI:  Abdomen is soft, nontender without organomegaly.  Normal bowel sounds.  Extremities:  No pedal edema.  Neurologic:  Nonfocal.  LABORATORY DATA:  Hemoglobin 11.5 grams, platelet count 285,000,  WBC 12,600 with an ANC of 8900.  IMPRESSION:  A 60 year old [place], [name] with a history of a stage I, high-grade ER/PR positive, HER-2 negative infiltrating ductal carcinoma of the left breast for which she underwent a left modified radical mastectomy and prophylactic right mastectomy with participation in NSABP B49 protocol for which she has completed 4 cycles of Adriamycin/Cytoxan given in a dose dense fashion.  She is now receiving weekly Taxol, due for week 6/12 planned.  Due to her history of neutropenia, she has been receiving Neupogen injections intermittently.  Case has been reviewed with Dr. Welton Flakes.  PLAN:  Quantavia will receive treatment today as scheduled.  I have dose reduced her Decadron to 10 mg.  Pertaining to the Neupogen, we will only dose it on days 2 and 3, we will regroup in 1 week's time prior to week 7.  She knows to contact us sooner if the need should arise.    ______________________________ Sharyl Nimrod, PA CS/MEDQ  D:  05/24/2011  T:  05/25/2011  Job:  [id number]

## 2011-06-02 ENCOUNTER — Ambulatory Visit: Payer: Medicare Other

## 2011-06-02 VITALS — BP 102/68 | HR 74 | Temp 98.5°F

## 2011-06-02 DIAGNOSIS — C50919 Malignant neoplasm of unspecified site of unspecified female breast: Secondary | ICD-10-CM

## 2011-06-02 DIAGNOSIS — C773 Secondary and unspecified malignant neoplasm of axilla and upper limb lymph nodes: Secondary | ICD-10-CM

## 2011-06-02 NOTE — Progress Notes (Unsigned)
Pt. Received Neupogen 480 but orders could not be released d/t PA not being able to authorize.  Tx given in right arm and pt. tolerated well.

## 2011-06-03 ENCOUNTER — Ambulatory Visit: Payer: Medicare Other

## 2011-06-04 ENCOUNTER — Ambulatory Visit (HOSPITAL_BASED_OUTPATIENT_CLINIC_OR_DEPARTMENT_OTHER): Payer: Medicare Other

## 2011-06-04 VITALS — BP 107/70 | HR 84 | Temp 98.8°F

## 2011-06-04 DIAGNOSIS — C50219 Malignant neoplasm of upper-inner quadrant of unspecified female breast: Secondary | ICD-10-CM

## 2011-06-04 DIAGNOSIS — C50919 Malignant neoplasm of unspecified site of unspecified female breast: Secondary | ICD-10-CM

## 2011-06-04 DIAGNOSIS — C773 Secondary and unspecified malignant neoplasm of axilla and upper limb lymph nodes: Secondary | ICD-10-CM

## 2011-06-04 MED ORDER — FILGRASTIM 480 MCG/0.8ML IJ SOLN
480.0000 ug | Freq: Once | INTRAMUSCULAR | Status: AC
  Administered 2011-06-04: 480 ug via SUBCUTANEOUS
  Filled 2011-06-04: qty 0.8

## 2011-06-04 NOTE — Progress Notes (Signed)
DIAGNOSIS:  A 60-year-old [place], [place] woman with a history of a T1 N0 (stage I) high-grade invasive ductal carcinoma the left breast, status post left modified radical mastectomy with a prophylactic right mastectomy.  The tumor was ER/PR positive, HER-2 negative.  She is participating in NSABP B-49 clinical trial for which she has completed 4 cycles of dose-dense Adriamycin/Cytoxan.  CURRENT THERAPY:  For week 6 of 12 planned single-agent Taxol with Neupogen providing granulocyte support due to history of neutropenia.  SUBJECTIVE:  Krystal [name] is seen today prior to week 7 of single-agent Taxol.  She is feeling well.  She is denying any unexplained fevers, chills, night sweats, shortness of breath, or chest pain.  No nausea, emesis, diarrhea, or constipation issues are noted.  She denies any neuropathy symptoms whatsoever.  She tolerates her Neupogen injections without difficulties.  REVIEW OF SYSTEMS:  Otherwise, negative.  ALLERGIES:  No known drug allergies.  CURRENT MEDICATIONS:  Reviewed with the patient and updated as per EMR.  PERFORMANCE STATUS:  ECOG status of zero.  PHYSICAL EXAMINATION:  Vital Signs:  Blood pressure is 104/70.  Pulse 81.  Respirations 20.  Temp 98.3.  Weight 174 pounds.  HEENT: Conjunctivae are pink.  Sclerae are anicteric.  Oropharynx is benign without oral mucositis or candidosis.  Neck:  No cervical or supraclavicular lymphadenopathy is present on exam.  Lungs:  Clear to auscultation without wheezing or rhonchi.  Heart:  Regular rate and rhythm without murmurs, rubs, gallops, or clicks.  Abdomen:  Soft and nontender without organomegaly.  Normal bowel sounds.  Extremities: Free of pedal edema.  Neurologic Exam:  Nonfocal.  LABORATORY DATA:  Hemoglobin 11.4 g, platelet count 248,000, WBC 4,700 with an ANC of 3,500.  Chemistry panel from today is pending.  IMPRESSION: 1. A 60-year-old [place], [place] woman with a history of  a     stage I, high-grade ER/PR positive, HE-2 negative, infiltrating     ductal carcinoma of the left breast for which she underwent a left     modified radical mastectomy with node dissection and prophylactic     right mastectomy.  She is participating in NSABP B-49 protocol for     which she has completed 4 cycles of Adriamycin/Cytoxan and is due     for week 7 of 12 planned Taxol today. 2. History of neutropenia for which she receives Neupogen 480 mcg     between days 2-6. The case has been reviewed with Dr. Kahn.  PLAN:  Krystal [name] will receive treatment today as scheduled.  Of note, her Decadron is decreased to 10 mg per provider preference.  She will return between days 2 and 6 receive Neupogen 480 mcg injections.  We will see her in 1 week's time prior to week 8 with appropriate pre-labs.  She knows to contact us sooner if the need should arise.    ______________________________ Krystal Macha, PA CS/MEDQ  D:  05/31/2011  T:  05/31/2011  Job:  [id number] 

## 2011-06-04 NOTE — Progress Notes (Signed)
CC:   Todd J. Rosenbower, M.D. [name] [name], MD Robert J Murray, M.D. Mary Wegenka, RN  DIAGNOSIS:  A 60-year-old [place], [place] woman with a history of a T1 N0 high-grade invasive ductal carcinoma of the left breast S/P left modified radical mastectomy with a prophylactic right simple mastectomy.  Tumor noted to be ER/PR positive, HER-2 negative with enrollment in the NSABP B49 clinical trial.  PAST THERAPY:  S/P 4 cycles of dose dense Adriamycin/Cytoxan with Neulasta support on day 2.  CURRENT THERAPY:  For week 6 of 12 planned single agent Taxol.  Neupogen providing granulocyte support due to neutropenia history.  SUBJECTIVE:  Ms. Gulyas is seen today prior to week 6 of weekly Taxol. She is doing well overall, denying any unexplained fevers, chills, night sweats, shortness of breath, chest pain, nausea, emesis, diarrhea or constipation problems.  Her overall energy level is pretty good.  She denies any significant flare in her fibromyalgia.  She does wonder, though, about the possibility of decreasing the frequency of the Neupogen injections since she is trying to get out of town for a few days for a mental health break.  REVIEW OF SYSTEMS:  Otherwise negative.  ALLERGIES:  No known drug allergies.  CURRENT MEDICATIONS:  Reviewed with patient and as per EMR.  ECOG STATUS:  Zero.  PHYSICAL EXAMINATION:  Vital signs:  Blood pressure is 123/79, pulse 89, respirations 20, temp 98.6 weight 173.6 pounds.  HEENT:  Conjunctivae pink.  Sclerae anicteric.  Oropharynx is benign without any evidence of oral mucositis or candidiasis.  Lungs:  Clear to auscultation without wheezing or rhonchi.  Cardiac:  Heart is regular rate and rhythm without murmurs, rubs, gallops or clicks.  GI:  Abdomen is soft, nontender without organomegaly.  Normal bowel sounds.  Extremities:  No pedal edema.  Neurologic:  Nonfocal.  LABORATORY DATA:  Hemoglobin 11.5 grams, platelet count 285,000,  WBC 12,600 with an ANC of 8900.  IMPRESSION:  A 60-year-old [place], [place] woman with a history of a stage I, high-grade ER/PR positive, HER-2 negative infiltrating ductal carcinoma of the left breast for which she underwent a left modified radical mastectomy and prophylactic right mastectomy with participation in NSABP B49 protocol for which she has completed 4 cycles of Adriamycin/Cytoxan given in a dose dense fashion.  She is now receiving weekly Taxol, due for week 6/12 planned.  Due to her history of neutropenia, she has been receiving Neupogen injections intermittently.  Case has been reviewed with Dr. Khan.  PLAN:  Nadina will receive treatment today as scheduled.  I have dose reduced her Decadron to 10 mg.  Pertaining to the Neupogen, we will only dose it on days 2 and 3, we will regroup in 1 week's time prior to week 7.  She knows to contact us sooner if the need should arise.    ______________________________ Dorse Locy, PA CS/MEDQ  D:  05/24/2011  T:  05/25/2011  Job:  [id number] 

## 2011-06-05 ENCOUNTER — Ambulatory Visit (HOSPITAL_BASED_OUTPATIENT_CLINIC_OR_DEPARTMENT_OTHER): Payer: Medicare Other

## 2011-06-05 VITALS — BP 101/68 | HR 93 | Temp 98.5°F

## 2011-06-05 DIAGNOSIS — C50919 Malignant neoplasm of unspecified site of unspecified female breast: Secondary | ICD-10-CM

## 2011-06-05 DIAGNOSIS — C773 Secondary and unspecified malignant neoplasm of axilla and upper limb lymph nodes: Secondary | ICD-10-CM

## 2011-06-05 DIAGNOSIS — C50219 Malignant neoplasm of upper-inner quadrant of unspecified female breast: Secondary | ICD-10-CM

## 2011-06-05 MED ORDER — FILGRASTIM 480 MCG/0.8ML IJ SOLN
480.0000 ug | Freq: Once | INTRAMUSCULAR | Status: AC
  Administered 2011-06-05: 480 ug via SUBCUTANEOUS
  Filled 2011-06-05: qty 0.8

## 2011-06-07 ENCOUNTER — Other Ambulatory Visit: Payer: Self-pay

## 2011-06-07 ENCOUNTER — Ambulatory Visit (HOSPITAL_BASED_OUTPATIENT_CLINIC_OR_DEPARTMENT_OTHER): Payer: Medicare Other

## 2011-06-07 ENCOUNTER — Ambulatory Visit (HOSPITAL_BASED_OUTPATIENT_CLINIC_OR_DEPARTMENT_OTHER): Payer: Medicare Other | Admitting: Physician Assistant

## 2011-06-07 ENCOUNTER — Other Ambulatory Visit (HOSPITAL_BASED_OUTPATIENT_CLINIC_OR_DEPARTMENT_OTHER): Payer: Medicare Other | Admitting: Lab

## 2011-06-07 VITALS — BP 111/68 | HR 85 | Temp 98.0°F | Ht 63.0 in | Wt 177.0 lb

## 2011-06-07 DIAGNOSIS — C50219 Malignant neoplasm of upper-inner quadrant of unspecified female breast: Secondary | ICD-10-CM

## 2011-06-07 DIAGNOSIS — Z5111 Encounter for antineoplastic chemotherapy: Secondary | ICD-10-CM

## 2011-06-07 DIAGNOSIS — C50919 Malignant neoplasm of unspecified site of unspecified female breast: Secondary | ICD-10-CM

## 2011-06-07 DIAGNOSIS — Z170 Estrogen receptor positive status [ER+]: Secondary | ICD-10-CM

## 2011-06-07 DIAGNOSIS — C773 Secondary and unspecified malignant neoplasm of axilla and upper limb lymph nodes: Secondary | ICD-10-CM

## 2011-06-07 DIAGNOSIS — R51 Headache: Secondary | ICD-10-CM

## 2011-06-07 LAB — CBC WITH DIFFERENTIAL/PLATELET
Basophils Absolute: 0.1 10*3/uL (ref 0.0–0.1)
EOS%: 1.6 % (ref 0.0–7.0)
HCT: 33.8 % — ABNORMAL LOW (ref 34.8–46.6)
HGB: 11 g/dL — ABNORMAL LOW (ref 11.6–15.9)
MCH: 32.3 pg (ref 25.1–34.0)
MCV: 99.1 fL (ref 79.5–101.0)
NEUT%: 80 % — ABNORMAL HIGH (ref 38.4–76.8)
lymph#: 1.4 10*3/uL (ref 0.9–3.3)

## 2011-06-07 LAB — COMPREHENSIVE METABOLIC PANEL
ALT: 30 U/L (ref 0–35)
AST: 28 U/L (ref 0–37)
Creatinine, Ser: 0.64 mg/dL (ref 0.50–1.10)
Total Bilirubin: 0.3 mg/dL (ref 0.3–1.2)

## 2011-06-07 MED ORDER — HEPARIN SOD (PORK) LOCK FLUSH 100 UNIT/ML IV SOLN
500.0000 [IU] | Freq: Once | INTRAVENOUS | Status: AC | PRN
  Administered 2011-06-07: 500 [IU]
  Filled 2011-06-07: qty 5

## 2011-06-07 MED ORDER — DIPHENHYDRAMINE HCL 50 MG/ML IJ SOLN
50.0000 mg | Freq: Once | INTRAMUSCULAR | Status: AC
  Administered 2011-06-07: 50 mg via INTRAVENOUS

## 2011-06-07 MED ORDER — DEXAMETHASONE SODIUM PHOSPHATE 4 MG/ML IJ SOLN
20.0000 mg | Freq: Once | INTRAMUSCULAR | Status: AC
  Administered 2011-06-07: 20 mg via INTRAVENOUS

## 2011-06-07 MED ORDER — ONDANSETRON 8 MG/50ML IVPB (CHCC)
8.0000 mg | Freq: Once | INTRAVENOUS | Status: AC
  Administered 2011-06-07: 8 mg via INTRAVENOUS

## 2011-06-07 MED ORDER — PACLITAXEL CHEMO INJECTION 300 MG/50ML
80.0000 mg/m2 | Freq: Once | INTRAVENOUS | Status: AC
  Administered 2011-06-07: 144 mg via INTRAVENOUS
  Filled 2011-06-07: qty 24

## 2011-06-07 MED ORDER — SODIUM CHLORIDE 0.9 % IJ SOLN
10.0000 mL | INTRAMUSCULAR | Status: DC | PRN
  Administered 2011-06-07: 10 mL
  Filled 2011-06-07: qty 10

## 2011-06-07 MED ORDER — SODIUM CHLORIDE 0.9 % IV SOLN
Freq: Once | INTRAVENOUS | Status: AC
  Administered 2011-06-07: 11:00:00 via INTRAVENOUS

## 2011-06-07 MED ORDER — FAMOTIDINE IN NACL 20-0.9 MG/50ML-% IV SOLN
20.0000 mg | Freq: Once | INTRAVENOUS | Status: AC
  Administered 2011-06-07: 20 mg via INTRAVENOUS

## 2011-06-07 NOTE — Progress Notes (Signed)
DIAGNOSIS:  A 60 year old [place], [name] with a history of a T1, N0 (stage I) high-grade invasive ductal carcinoma of the left breast status post left modified radical mastectomy with prophylactic right mastectomy.  Tumor noted to be ER PR positive, HER-2 negative.  Participation in NSABP B-49 clinical trial for which she has completed 4 cycles of dose dense Adriamycin/Cytoxan.  CURRENT THERAPY:  For week 8 of 12 planned Taxol with Neupogen being provided for granulocyte support due to a history of thrombocytopenia.  SUBJECTIVE:  Krystal [name] is seen today unaccompanied prior to week 8 of single agent Taxol.  She feels well.  She does note following her 4 injections of Neupogen that she did have headaches associated with the last couple, no diffuse myalgias or arthralgias.  She denies any fevers, chills, shortness of breath, or chest pain.  No nausea, emesis, diarrhea, or constipation issues.  No neuropathy symptoms.  Review of systems is negative.  ALLERGIES:  No known drug allergies.  CURRENT MEDICATIONS:  Reviewed with the patient, as per EMR.  ECOG STATUS:  0.  OBJECTIVE/PHYSICAL EXAMINATION:  Vital signs:  Blood pressure is 111/68, pulse 85, respirations 20, temp 98, weight 177 pounds.  HEENT: Conjunctivae pink.  Sclerae anicteric.  Oropharynx is benign without oral mucositis or candidosis.  Lungs:  Clear to auscultation without wheezing or rhonchi.  Heart:  Regular rate and rhythm without murmurs, rubs, gallops, or clicks.  Abdomen:  Soft, nontender.  No evidence of organomegaly.  Normal bowel sounds.  Extremities:  Free of pedal edema or nail bed changes.  Neurologic:  Nonfocal.  The patient is alert and oriented.  LABORATORY DATA:  Hemoglobin 11.0 g, platelet count 300,000, WBC 13,100 with an ANC of 10,500.  IMPRESSION: 34. A 60 year old [place], [name] with a history of a     stage I, high-grade ER PR positive, HER-2 negative  infiltrating     ductal carcinoma of the left breast for which she underwent a left     modified radical mastectomy with axillary node dissection and     prophylactic right simple mastectomy. 2. Participation in NSABP B-49 protocol for which she has completed 4     cycles of dose dense Adriamycin/Cytoxan, due for week 8 of 12     planned single agent Taxol today. 3. History of neutropenia for which she has received Neupogen 480 mcg     injections intermittently.  She will be receiving dosing on the     11/16 and 11/17 this go round due to her history of headaches and     moderate leukocytosis today.  Case has been reviewed with Dr. Welton Flakes.  PLAN:  Proceed with treatment as outlined above.  Krystal [name] will return on 06/13/2011 in anticipation of week 9 on 06/15/2011.  CBC and CMET will be repeated.  She knows to contact us in the interim if the need should arise.    ______________________________ Sharyl Nimrod, PA CS/MEDQ  D:  06/07/2011  T:  06/07/2011  Job:  [id number]

## 2011-06-07 NOTE — Progress Notes (Signed)
Progress note dictated-CTS 

## 2011-06-08 ENCOUNTER — Ambulatory Visit (HOSPITAL_BASED_OUTPATIENT_CLINIC_OR_DEPARTMENT_OTHER): Payer: Medicare Other

## 2011-06-08 VITALS — BP 111/67 | HR 82 | Temp 99.2°F

## 2011-06-08 DIAGNOSIS — C50219 Malignant neoplasm of upper-inner quadrant of unspecified female breast: Secondary | ICD-10-CM

## 2011-06-08 DIAGNOSIS — C50919 Malignant neoplasm of unspecified site of unspecified female breast: Secondary | ICD-10-CM

## 2011-06-08 MED ORDER — FILGRASTIM 480 MCG/0.8ML IJ SOLN
480.0000 ug | Freq: Once | INTRAMUSCULAR | Status: AC
  Administered 2011-06-08: 480 ug via SUBCUTANEOUS
  Filled 2011-06-08: qty 0.8

## 2011-06-09 ENCOUNTER — Ambulatory Visit (HOSPITAL_BASED_OUTPATIENT_CLINIC_OR_DEPARTMENT_OTHER): Payer: PRIVATE HEALTH INSURANCE

## 2011-06-09 VITALS — BP 108/63 | HR 84 | Temp 97.4°F

## 2011-06-09 DIAGNOSIS — C50219 Malignant neoplasm of upper-inner quadrant of unspecified female breast: Secondary | ICD-10-CM

## 2011-06-09 DIAGNOSIS — Z5111 Encounter for antineoplastic chemotherapy: Secondary | ICD-10-CM

## 2011-06-09 DIAGNOSIS — C50919 Malignant neoplasm of unspecified site of unspecified female breast: Secondary | ICD-10-CM

## 2011-06-09 MED ORDER — FILGRASTIM 480 MCG/0.8ML IJ SOLN
480.0000 ug | Freq: Once | INTRAMUSCULAR | Status: AC
  Administered 2011-06-09: 480 ug via SUBCUTANEOUS

## 2011-06-13 ENCOUNTER — Other Ambulatory Visit: Payer: Medicare Other | Admitting: Lab

## 2011-06-13 ENCOUNTER — Encounter: Payer: Self-pay | Admitting: Physician Assistant

## 2011-06-13 ENCOUNTER — Ambulatory Visit (HOSPITAL_BASED_OUTPATIENT_CLINIC_OR_DEPARTMENT_OTHER): Payer: Medicare Other | Admitting: Physician Assistant

## 2011-06-13 ENCOUNTER — Other Ambulatory Visit: Payer: Self-pay

## 2011-06-13 ENCOUNTER — Other Ambulatory Visit: Payer: Self-pay | Admitting: *Deleted

## 2011-06-13 ENCOUNTER — Telehealth: Payer: Self-pay | Admitting: *Deleted

## 2011-06-13 VITALS — BP 130/75 | HR 77 | Temp 97.8°F | Ht 63.0 in | Wt 175.3 lb

## 2011-06-13 DIAGNOSIS — R51 Headache: Secondary | ICD-10-CM

## 2011-06-13 DIAGNOSIS — C50219 Malignant neoplasm of upper-inner quadrant of unspecified female breast: Secondary | ICD-10-CM

## 2011-06-13 DIAGNOSIS — C50919 Malignant neoplasm of unspecified site of unspecified female breast: Secondary | ICD-10-CM

## 2011-06-13 DIAGNOSIS — Z170 Estrogen receptor positive status [ER+]: Secondary | ICD-10-CM

## 2011-06-13 LAB — COMPREHENSIVE METABOLIC PANEL
AST: 22 U/L (ref 0–37)
Albumin: 4 g/dL (ref 3.5–5.2)
Alkaline Phosphatase: 88 U/L (ref 39–117)
Calcium: 9 mg/dL (ref 8.4–10.5)
Chloride: 103 mEq/L (ref 96–112)
Glucose, Bld: 86 mg/dL (ref 70–99)
Potassium: 4 mEq/L (ref 3.5–5.3)
Sodium: 139 mEq/L (ref 135–145)
Total Protein: 5.8 g/dL — ABNORMAL LOW (ref 6.0–8.3)

## 2011-06-13 LAB — CBC WITH DIFFERENTIAL/PLATELET
Basophils Absolute: 0 10*3/uL (ref 0.0–0.1)
Eosinophils Absolute: 0.1 10*3/uL (ref 0.0–0.5)
HGB: 12 g/dL (ref 11.6–15.9)
MCV: 99.4 fL (ref 79.5–101.0)
MONO%: 3 % (ref 0.0–14.0)
NEUT#: 5.7 10*3/uL (ref 1.5–6.5)
RBC: 3.6 10*6/uL — ABNORMAL LOW (ref 3.70–5.45)
RDW: 17.2 % — ABNORMAL HIGH (ref 11.2–14.5)
WBC: 7 10*3/uL (ref 3.9–10.3)
lymph#: 1 10*3/uL (ref 0.9–3.3)

## 2011-06-13 NOTE — Telephone Encounter (Signed)
Gave patient appointments for 05-2011 and 06-2011

## 2011-06-13 NOTE — Progress Notes (Signed)
DIAGNOSES:  A 60 year old [place], [name] with a history of a T1, N0, high-grade invasive ductal carcinoma of the left breast status post left modified radical mastectomy with prophylactic right mastectomy, tumor noted to be ER PR positive, HER-2 negative, participation in NSABP B-49 clinical trial for which she has completed 4 cycles of dose dense Adriamycin/Cytoxan.  CURRENT THERAPY:  For week 9 of 12 planned Taxol with Neupogen support due to history of neutropenia.  SUBJECTIVE:  Ms. Griffin is seen today unaccompanied prior to week 9 of single agent Taxol scheduled for 06/04/2011.  She is feeling okay. Again she received Neupogen 480 mcg on 11/16 and 11/17.  She still has some mild headaches, but at this point in time she is feeling pretty well.  It is noted her white count is 7000 today with an ANC of 5700. Otherwise she denies any fevers, chills, night sweats, shortness of breath, chest pain, nausea, emesis, diarrhea, or constipation issues. No neuropathy symptoms whatsoever.  She did report though, her sister age 71 was diagnosed with breast cancer about 4 weeks ago and is under the process of figuring out exactly what she is going to be doing.  This has been updated in her family medical history.  Review of systems is negative.  ALLERGIES:  No known drug allergies.  CURRENT MEDICATIONS:  As per EMR.  ECOG STATUS:  0.  OBJECTIVE/PHYSICAL EXAMINATION:  Vital signs:  Blood pressure is 130/75, pulse 77, respirations 20, temp 97.8, weight 175 pounds.  HEENT: Conjunctivae pink.  Sclerae anicteric.  Oropharynx is benign without any evidence of oral mucositis or candidosis.  Lungs:  Clear to auscultation.  No wheezing or rhonchi.  Heart:  Regular rate and rhythm without murmurs, rubs, gallops, or clicks.  Abdomen:  Soft, nontender without organomegaly.  Normal bowel sounds are present.  Extremities: No frank pitting pedal edema.  Neurologic:  Nonfocal with the  patient alert and oriented x3.  LABORATORY DATA:  Hemoglobin 12.0 g, platelet count 258,000, WBC 7000 with an ANC of 5700.  IMPRESSION: 18. A 60 year old [place], [name] with a history of a     stage I, high-grade ER PR positive, her 2- infiltrating ductal     carcinoma of the left breast for which she underwent a left     modified radical mastectomy with axillary node dissection and     prophylactic right simple mastectomy.  She is participating in     NSABP B-49 protocol for which she has completed 4 cycles of dose     dense Adriamycin/Cytoxan, due for Taxol week 9 of 2 on 06/15/2011. 2. History of intermittent Neupogen injections to maintain WBC and     ANC.  Case been reviewed Dr. Park Breed.  PLAN:  Jaylaa will return on 06/15/2011 for week 9 of Taxol. Pertaining to Neupogen, she will receive Neupogen 480 mcg on 11/24, 11/26 and 11/27.  She will be seen on 06/21/2011 in anticipation of week 10.  She knows to contact us sooner if the need should arise.    ______________________________ Sharyl Nimrod, PA CS/MEDQ  D:  06/13/2011  T:  06/13/2011  Job:  [id number]

## 2011-06-13 NOTE — Progress Notes (Signed)
Progress note dictated-CTS 

## 2011-06-15 ENCOUNTER — Ambulatory Visit (HOSPITAL_BASED_OUTPATIENT_CLINIC_OR_DEPARTMENT_OTHER): Payer: Medicare Other

## 2011-06-15 ENCOUNTER — Encounter: Payer: Self-pay | Admitting: *Deleted

## 2011-06-15 VITALS — BP 111/64 | HR 73 | Temp 97.7°F

## 2011-06-15 DIAGNOSIS — Z5111 Encounter for antineoplastic chemotherapy: Secondary | ICD-10-CM

## 2011-06-15 DIAGNOSIS — C50919 Malignant neoplasm of unspecified site of unspecified female breast: Secondary | ICD-10-CM

## 2011-06-15 DIAGNOSIS — C50219 Malignant neoplasm of upper-inner quadrant of unspecified female breast: Secondary | ICD-10-CM

## 2011-06-15 DIAGNOSIS — C773 Secondary and unspecified malignant neoplasm of axilla and upper limb lymph nodes: Secondary | ICD-10-CM

## 2011-06-15 MED ORDER — DIPHENHYDRAMINE HCL 50 MG/ML IJ SOLN
50.0000 mg | Freq: Once | INTRAMUSCULAR | Status: AC
  Administered 2011-06-15: 50 mg via INTRAVENOUS

## 2011-06-15 MED ORDER — ONDANSETRON 8 MG/50ML IVPB (CHCC)
8.0000 mg | Freq: Once | INTRAVENOUS | Status: AC
  Administered 2011-06-15: 8 mg via INTRAVENOUS

## 2011-06-15 MED ORDER — SODIUM CHLORIDE 0.9 % IV SOLN
Freq: Once | INTRAVENOUS | Status: AC
  Administered 2011-06-15: 10:00:00 via INTRAVENOUS

## 2011-06-15 MED ORDER — PACLITAXEL CHEMO INJECTION 300 MG/50ML
80.0000 mg/m2 | Freq: Once | INTRAVENOUS | Status: AC
  Administered 2011-06-15: 144 mg via INTRAVENOUS
  Filled 2011-06-15: qty 24

## 2011-06-15 MED ORDER — SODIUM CHLORIDE 0.9 % IJ SOLN
10.0000 mL | INTRAMUSCULAR | Status: DC | PRN
  Administered 2011-06-15 (×2): 10 mL
  Filled 2011-06-15: qty 10

## 2011-06-15 MED ORDER — DEXAMETHASONE SODIUM PHOSPHATE 4 MG/ML IJ SOLN
20.0000 mg | Freq: Once | INTRAMUSCULAR | Status: AC
  Administered 2011-06-15: 20 mg via INTRAVENOUS

## 2011-06-15 MED ORDER — HEPARIN SOD (PORK) LOCK FLUSH 100 UNIT/ML IV SOLN
500.0000 [IU] | Freq: Once | INTRAVENOUS | Status: AC | PRN
  Administered 2011-06-15: 500 [IU]
  Filled 2011-06-15: qty 5

## 2011-06-15 MED ORDER — FAMOTIDINE IN NACL 20-0.9 MG/50ML-% IV SOLN
20.0000 mg | Freq: Once | INTRAVENOUS | Status: AC
  Administered 2011-06-15: 20 mg via INTRAVENOUS

## 2011-06-15 NOTE — Progress Notes (Signed)
06/15/11 at 09:47 - The pt is here for her chemotherapy.  She denies any new adverse events.  She reported that she had a good Thanksgiving.  She is aware of her future appts.  Rn gave Vincent Peyer, chemo nurse, the infusion sign.

## 2011-06-16 ENCOUNTER — Ambulatory Visit (HOSPITAL_BASED_OUTPATIENT_CLINIC_OR_DEPARTMENT_OTHER): Payer: Medicare Other

## 2011-06-16 VITALS — BP 116/69 | HR 83 | Temp 98.0°F

## 2011-06-16 DIAGNOSIS — C773 Secondary and unspecified malignant neoplasm of axilla and upper limb lymph nodes: Secondary | ICD-10-CM

## 2011-06-16 DIAGNOSIS — C50919 Malignant neoplasm of unspecified site of unspecified female breast: Secondary | ICD-10-CM

## 2011-06-16 DIAGNOSIS — C50219 Malignant neoplasm of upper-inner quadrant of unspecified female breast: Secondary | ICD-10-CM

## 2011-06-16 MED ORDER — FILGRASTIM 480 MCG/0.8ML IJ SOLN
480.0000 ug | Freq: Once | INTRAMUSCULAR | Status: AC
  Administered 2011-06-16: 480 ug via SUBCUTANEOUS

## 2011-06-18 ENCOUNTER — Ambulatory Visit (HOSPITAL_BASED_OUTPATIENT_CLINIC_OR_DEPARTMENT_OTHER): Payer: Medicare Other

## 2011-06-18 VITALS — BP 124/77 | HR 78 | Temp 99.4°F

## 2011-06-18 DIAGNOSIS — C50919 Malignant neoplasm of unspecified site of unspecified female breast: Secondary | ICD-10-CM

## 2011-06-18 DIAGNOSIS — C50219 Malignant neoplasm of upper-inner quadrant of unspecified female breast: Secondary | ICD-10-CM

## 2011-06-18 MED ORDER — FILGRASTIM 480 MCG/0.8ML IJ SOLN
480.0000 ug | Freq: Once | INTRAMUSCULAR | Status: AC
  Administered 2011-06-18: 480 ug via SUBCUTANEOUS
  Filled 2011-06-18: qty 0.8

## 2011-06-19 ENCOUNTER — Ambulatory Visit (HOSPITAL_BASED_OUTPATIENT_CLINIC_OR_DEPARTMENT_OTHER): Payer: Medicare Other

## 2011-06-19 VITALS — BP 122/71 | HR 90 | Temp 99.4°F

## 2011-06-19 DIAGNOSIS — C50219 Malignant neoplasm of upper-inner quadrant of unspecified female breast: Secondary | ICD-10-CM

## 2011-06-19 DIAGNOSIS — C773 Secondary and unspecified malignant neoplasm of axilla and upper limb lymph nodes: Secondary | ICD-10-CM

## 2011-06-19 DIAGNOSIS — C50919 Malignant neoplasm of unspecified site of unspecified female breast: Secondary | ICD-10-CM

## 2011-06-19 MED ORDER — FILGRASTIM 480 MCG/0.8ML IJ SOLN
480.0000 ug | Freq: Once | INTRAMUSCULAR | Status: AC
  Administered 2011-06-19: 480 ug via SUBCUTANEOUS
  Filled 2011-06-19: qty 0.8

## 2011-06-20 ENCOUNTER — Other Ambulatory Visit: Payer: Self-pay | Admitting: *Deleted

## 2011-06-20 DIAGNOSIS — C50919 Malignant neoplasm of unspecified site of unspecified female breast: Secondary | ICD-10-CM

## 2011-06-21 ENCOUNTER — Ambulatory Visit: Payer: Medicare Other

## 2011-06-21 ENCOUNTER — Ambulatory Visit (HOSPITAL_BASED_OUTPATIENT_CLINIC_OR_DEPARTMENT_OTHER): Payer: Medicare Other | Admitting: Physician Assistant

## 2011-06-21 ENCOUNTER — Other Ambulatory Visit (HOSPITAL_BASED_OUTPATIENT_CLINIC_OR_DEPARTMENT_OTHER): Payer: Medicare Other

## 2011-06-21 ENCOUNTER — Encounter: Payer: Self-pay | Admitting: Physician Assistant

## 2011-06-21 ENCOUNTER — Encounter: Payer: Self-pay | Admitting: *Deleted

## 2011-06-21 ENCOUNTER — Telehealth: Payer: Self-pay | Admitting: *Deleted

## 2011-06-21 VITALS — BP 128/77 | HR 94 | Temp 98.3°F | Ht 63.0 in | Wt 175.4 lb

## 2011-06-21 DIAGNOSIS — Z170 Estrogen receptor positive status [ER+]: Secondary | ICD-10-CM

## 2011-06-21 DIAGNOSIS — Z5111 Encounter for antineoplastic chemotherapy: Secondary | ICD-10-CM

## 2011-06-21 DIAGNOSIS — C50219 Malignant neoplasm of upper-inner quadrant of unspecified female breast: Secondary | ICD-10-CM

## 2011-06-21 DIAGNOSIS — C50919 Malignant neoplasm of unspecified site of unspecified female breast: Secondary | ICD-10-CM

## 2011-06-21 LAB — COMPREHENSIVE METABOLIC PANEL
AST: 23 U/L (ref 0–37)
Albumin: 3.4 g/dL — ABNORMAL LOW (ref 3.5–5.2)
Alkaline Phosphatase: 100 U/L (ref 39–117)
BUN: 8 mg/dL (ref 6–23)
Glucose, Bld: 118 mg/dL — ABNORMAL HIGH (ref 70–99)
Potassium: 3.5 mEq/L (ref 3.5–5.3)
Total Bilirubin: 0.2 mg/dL — ABNORMAL LOW (ref 0.3–1.2)

## 2011-06-21 LAB — CBC WITH DIFFERENTIAL/PLATELET
Basophils Absolute: 0.1 10*3/uL (ref 0.0–0.1)
EOS%: 1.4 % (ref 0.0–7.0)
HGB: 11.6 g/dL (ref 11.6–15.9)
LYMPH%: 14 % (ref 14.0–49.7)
MCH: 32.6 pg (ref 25.1–34.0)
MCV: 96.9 fL (ref 79.5–101.0)
MONO%: 9 % (ref 0.0–14.0)
Platelets: 301 10*3/uL (ref 145–400)
RBC: 3.56 10*6/uL — ABNORMAL LOW (ref 3.70–5.45)
RDW: 15.9 % — ABNORMAL HIGH (ref 11.2–14.5)

## 2011-06-21 MED ORDER — PACLITAXEL CHEMO INJECTION 300 MG/50ML
80.0000 mg/m2 | Freq: Once | INTRAVENOUS | Status: AC
  Administered 2011-06-21: 144 mg via INTRAVENOUS
  Filled 2011-06-21: qty 24

## 2011-06-21 MED ORDER — DEXAMETHASONE SODIUM PHOSPHATE 10 MG/ML IJ SOLN
10.0000 mg | Freq: Once | INTRAMUSCULAR | Status: AC
  Administered 2011-06-21: 10 mg via INTRAVENOUS

## 2011-06-21 MED ORDER — FAMOTIDINE IN NACL 20-0.9 MG/50ML-% IV SOLN
20.0000 mg | Freq: Once | INTRAVENOUS | Status: AC
  Administered 2011-06-21: 20 mg via INTRAVENOUS

## 2011-06-21 MED ORDER — SODIUM CHLORIDE 0.9 % IV SOLN
Freq: Once | INTRAVENOUS | Status: AC
  Administered 2011-06-21: 11:00:00 via INTRAVENOUS

## 2011-06-21 MED ORDER — ONDANSETRON 8 MG/50ML IVPB (CHCC)
8.0000 mg | Freq: Once | INTRAVENOUS | Status: AC
  Administered 2011-06-21: 8 mg via INTRAVENOUS

## 2011-06-21 MED ORDER — HEPARIN SOD (PORK) LOCK FLUSH 100 UNIT/ML IV SOLN
500.0000 [IU] | Freq: Once | INTRAVENOUS | Status: AC | PRN
  Administered 2011-06-21: 500 [IU]
  Filled 2011-06-21: qty 5

## 2011-06-21 MED ORDER — SODIUM CHLORIDE 0.9 % IJ SOLN
10.0000 mL | INTRAMUSCULAR | Status: DC | PRN
  Administered 2011-06-21: 10 mL
  Filled 2011-06-21: qty 10

## 2011-06-21 MED ORDER — DIPHENHYDRAMINE HCL 50 MG/ML IJ SOLN
50.0000 mg | Freq: Once | INTRAMUSCULAR | Status: AC
  Administered 2011-06-21: 50 mg via INTRAVENOUS

## 2011-06-21 NOTE — Telephone Encounter (Signed)
gave patient appointment for 06-25-2011 and 06-26-2011.

## 2011-06-21 NOTE — Progress Notes (Signed)
Mrs. Coomer is in the cancer center today for lab work, physical exam, and to receive #10 of 12 Taxol doses. Her schedule is made through the 12 cycles.  Exam performed by Blake Divine, PA.  Lab results WNL for treatment. Sign for infusion given to Rayvon Char, RN.

## 2011-06-21 NOTE — Patient Instructions (Signed)
Pt tolerated chemo well.  Pt discharged to home with spouse.  PT to call for questions or concerns. Shk

## 2011-06-21 NOTE — Progress Notes (Signed)
DIAGNOSIS:  A 60 year old [place], [name] with a history of a T1, N0 high-grade invasive ductal carcinoma of the left breast status post left modified radical mastectomy with a prophylactic right mastectomy.  Tumor was noted to be ER PR positive, HER-2 negative. She is participating in the NSABP B-49 clinical trial for which she has completed 4 cycles of dose dense AC.  CURRENT THERAPY:  For week 10 of 12 planned single agent Taxol with Neupogen support due to history of neutropenia.  SUBJECTIVE:  [name] is seen today prior to initiating week 10 of single agent Taxol.  She is feeling well.  She notes that when she does receive her Neupogen injections she has a headache "for the rest of the day", it does seem to abate a bit after time with use of ibuprofen, but it can be incapacitating for a short period of time.  Otherwise though she denies any fevers or chills.  She has occasional night sweats, more of a hot flash.  She denies any nausea or emesis issues, but she notes quite a bit of "gas", she denies any diarrhea or constipation issues. No neuropathy symptoms whatsoever.  No diffuse bony pains.  No bleeding or bruising symptoms.  Review of systems is otherwise negative.  ALLERGIES:  No known drug allergies.  CURRENT MEDICATIONS:  As per EMR.  ECOG STATUS:  1.  OBJECTIVE/PHYSICAL EXAMINATION:  Vital signs:  Blood pressure is 128/77, pulse 94, respirations 20, temp 98.3, weight 175 pounds.  HEENT: Conjunctivae pink.  Sclerae anicteric.  Oropharynx is benign without oral mucositis or candidiasis.  Lungs:  Clear to auscultation without wheezing or rhonchi.  Heart:  Regular rate and rhythm without murmurs, rubs, gallops, or clicks.  Abdomen:  Soft, nontender without organomegaly.  Normal bowel sounds are present.  Extremities:  Benign without pedal edema.  Neurologic:  Nonfocal.  The patient is alert and oriented x3.  LABORATORY DATA:  Hemoglobin 11.6 g,  platelet count 301,000, WBC 9900 with an ANC of 7400.  IMPRESSION: 59. A 60 year old [place], [name] with a history of a     stage I high grade ER PR positive, HER-2 negative infiltrating     ductal carcinoma of the left breast for which she underwent a left     modified radical mastectomy with axillary node dissection and a     prophylactic right simple mastectomy.  The patient is participating     in NSABP B-49 protocol for which she has completed 4 cycles of dose     dense Adriamycin/Cytoxan due for week 10 of 12 planned Taxol. 2. History of Neupogen injections to maintain WBC and ANC.  Case     reviewed with Dr. Welton Flakes.  PLAN:  Shermeka will receive her chemo today as scheduled.  Of note, I have decreased her Decadron to 10 mg.  she will receive Neupogen 480 mcg on 12/03 and 06/26/2011.  We will see her in 1 week's time prior to week 11.  She knows to contact us in the interim if the need should arise.    ______________________________ Sharyl Nimrod, PA CS/MEDQ  D:  06/21/2011  T:  06/21/2011  Job:  [id number]

## 2011-06-21 NOTE — Progress Notes (Signed)
Progress note dictated-CTS 

## 2011-06-22 ENCOUNTER — Other Ambulatory Visit: Payer: Self-pay | Admitting: Certified Registered Nurse Anesthetist

## 2011-06-25 ENCOUNTER — Ambulatory Visit (HOSPITAL_BASED_OUTPATIENT_CLINIC_OR_DEPARTMENT_OTHER): Payer: Medicare Other

## 2011-06-25 VITALS — BP 114/67 | HR 82 | Temp 99.1°F

## 2011-06-25 DIAGNOSIS — C50219 Malignant neoplasm of upper-inner quadrant of unspecified female breast: Secondary | ICD-10-CM

## 2011-06-25 DIAGNOSIS — C50919 Malignant neoplasm of unspecified site of unspecified female breast: Secondary | ICD-10-CM

## 2011-06-25 DIAGNOSIS — C773 Secondary and unspecified malignant neoplasm of axilla and upper limb lymph nodes: Secondary | ICD-10-CM

## 2011-06-25 MED ORDER — FILGRASTIM 480 MCG/0.8ML IJ SOLN
480.0000 ug | Freq: Once | INTRAMUSCULAR | Status: AC
  Administered 2011-06-25: 480 ug via SUBCUTANEOUS
  Filled 2011-06-25: qty 0.8

## 2011-06-26 ENCOUNTER — Ambulatory Visit (HOSPITAL_BASED_OUTPATIENT_CLINIC_OR_DEPARTMENT_OTHER): Payer: Medicare Other

## 2011-06-26 VITALS — BP 97/57 | HR 84 | Temp 97.8°F

## 2011-06-26 DIAGNOSIS — Z170 Estrogen receptor positive status [ER+]: Secondary | ICD-10-CM

## 2011-06-26 DIAGNOSIS — C50919 Malignant neoplasm of unspecified site of unspecified female breast: Secondary | ICD-10-CM

## 2011-06-26 DIAGNOSIS — C50219 Malignant neoplasm of upper-inner quadrant of unspecified female breast: Secondary | ICD-10-CM

## 2011-06-26 DIAGNOSIS — C773 Secondary and unspecified malignant neoplasm of axilla and upper limb lymph nodes: Secondary | ICD-10-CM

## 2011-06-26 MED ORDER — FILGRASTIM 480 MCG/0.8ML IJ SOLN
480.0000 ug | Freq: Once | INTRAMUSCULAR | Status: AC
  Administered 2011-06-26: 480 ug via SUBCUTANEOUS
  Filled 2011-06-26: qty 0.8

## 2011-06-28 ENCOUNTER — Encounter: Payer: Self-pay | Admitting: *Deleted

## 2011-06-28 ENCOUNTER — Ambulatory Visit (HOSPITAL_BASED_OUTPATIENT_CLINIC_OR_DEPARTMENT_OTHER): Payer: Medicare Other | Admitting: Physician Assistant

## 2011-06-28 ENCOUNTER — Other Ambulatory Visit (HOSPITAL_BASED_OUTPATIENT_CLINIC_OR_DEPARTMENT_OTHER): Payer: Medicare Other

## 2011-06-28 ENCOUNTER — Ambulatory Visit (HOSPITAL_BASED_OUTPATIENT_CLINIC_OR_DEPARTMENT_OTHER): Payer: Medicare Other

## 2011-06-28 ENCOUNTER — Telehealth: Payer: Self-pay | Admitting: *Deleted

## 2011-06-28 VITALS — BP 126/73 | HR 89 | Temp 98.3°F | Ht 63.0 in | Wt 175.2 lb

## 2011-06-28 DIAGNOSIS — Z9010 Acquired absence of unspecified breast and nipple: Secondary | ICD-10-CM

## 2011-06-28 DIAGNOSIS — C50919 Malignant neoplasm of unspecified site of unspecified female breast: Secondary | ICD-10-CM

## 2011-06-28 DIAGNOSIS — C50219 Malignant neoplasm of upper-inner quadrant of unspecified female breast: Secondary | ICD-10-CM

## 2011-06-28 DIAGNOSIS — Z170 Estrogen receptor positive status [ER+]: Secondary | ICD-10-CM

## 2011-06-28 DIAGNOSIS — C773 Secondary and unspecified malignant neoplasm of axilla and upper limb lymph nodes: Secondary | ICD-10-CM

## 2011-06-28 DIAGNOSIS — Z5111 Encounter for antineoplastic chemotherapy: Secondary | ICD-10-CM

## 2011-06-28 LAB — COMPREHENSIVE METABOLIC PANEL
ALT: 30 U/L (ref 0–35)
AST: 19 U/L (ref 0–37)
Albumin: 3.9 g/dL (ref 3.5–5.2)
CO2: 25 mEq/L (ref 19–32)
Calcium: 8.4 mg/dL (ref 8.4–10.5)
Chloride: 99 mEq/L (ref 96–112)
Potassium: 4 mEq/L (ref 3.5–5.3)
Sodium: 134 mEq/L — ABNORMAL LOW (ref 135–145)
Total Protein: 5.2 g/dL — ABNORMAL LOW (ref 6.0–8.3)

## 2011-06-28 LAB — CBC WITH DIFFERENTIAL/PLATELET
BASO%: 0.3 % (ref 0.0–2.0)
Eosinophils Absolute: 0.1 10*3/uL (ref 0.0–0.5)
MCHC: 33.8 g/dL (ref 31.5–36.0)
MONO#: 0.4 10*3/uL (ref 0.1–0.9)
NEUT#: 10.1 10*3/uL — ABNORMAL HIGH (ref 1.5–6.5)
Platelets: 263 10*3/uL (ref 145–400)
RBC: 3.49 10*6/uL — ABNORMAL LOW (ref 3.70–5.45)
WBC: 11.9 10*3/uL — ABNORMAL HIGH (ref 3.9–10.3)
lymph#: 1.2 10*3/uL (ref 0.9–3.3)
nRBC: 0 % (ref 0–0)

## 2011-06-28 MED ORDER — DEXAMETHASONE SODIUM PHOSPHATE 10 MG/ML IJ SOLN
10.0000 mg | Freq: Once | INTRAMUSCULAR | Status: AC
  Administered 2011-06-28: 10 mg via INTRAVENOUS

## 2011-06-28 MED ORDER — ONDANSETRON 8 MG/50ML IVPB (CHCC)
8.0000 mg | Freq: Once | INTRAVENOUS | Status: AC
  Administered 2011-06-28: 8 mg via INTRAVENOUS

## 2011-06-28 MED ORDER — DIPHENHYDRAMINE HCL 50 MG/ML IJ SOLN
50.0000 mg | Freq: Once | INTRAMUSCULAR | Status: AC
  Administered 2011-06-28: 50 mg via INTRAVENOUS

## 2011-06-28 MED ORDER — FAMOTIDINE IN NACL 20-0.9 MG/50ML-% IV SOLN
20.0000 mg | Freq: Once | INTRAVENOUS | Status: AC
  Administered 2011-06-28: 20 mg via INTRAVENOUS

## 2011-06-28 MED ORDER — SODIUM CHLORIDE 0.9 % IV SOLN: Freq: Once | INTRAVENOUS | Status: DC

## 2011-06-28 MED ORDER — HEPARIN SOD (PORK) LOCK FLUSH 100 UNIT/ML IV SOLN
500.0000 [IU] | Freq: Once | INTRAVENOUS | Status: AC | PRN
  Administered 2011-06-28: 500 [IU]
  Filled 2011-06-28: qty 5

## 2011-06-28 MED ORDER — SODIUM CHLORIDE 0.9 % IJ SOLN
10.0000 mL | INTRAMUSCULAR | Status: DC | PRN
  Administered 2011-06-28: 10 mL
  Filled 2011-06-28: qty 10

## 2011-06-28 MED ORDER — PACLITAXEL CHEMO INJECTION 300 MG/50ML
80.0000 mg/m2 | Freq: Once | INTRAVENOUS | Status: AC
  Administered 2011-06-28: 144 mg via INTRAVENOUS
  Filled 2011-06-28: qty 24

## 2011-06-28 NOTE — Progress Notes (Signed)
NSABP B-49, Cycle3, day 8 taxol (#11)  Krystal [name] is in the cancer center today for her 11th treatment with taxol. She denies any nausea. She denies any change or tingling in her fingers and toes. She says she has been knitting without any difficulty.  Her CBC is WNL for treatment.  She is scheduled to receive neupogen.   Treatment update given to E.Manning Charity, RN  .

## 2011-06-28 NOTE — Progress Notes (Signed)
DIAGNOSIS:  A 60 year old, Krystal [name], [name] with a history of a T1 N0, high-grade, invasive ductal carcinoma of the left breast, status post left modified radical mastectomy with prophylactic right mastectomy.  Tumor noted to be ER/PR positive, HER-2 negative. She is participating in a NSABP B-49 clinical trial for which she has completed 4 cycles of dose-dense Adriamycin/Cytoxan.  CURRENT THERAPY:  For week 11 of 12 planned single-agent Taxol with Neupogen support due to his history of neutropenia.  SUBJECTIVE:  Krystal [name] is seen today unaccompanied prior to initiating week 11 of single-agent Taxol.  She actually feels quite well overall, denying any unexplained fevers, chills, night sweats, shortness of breath, or chest pain.  No nausea, emesis, diarrhea, or constipation issues.  Of note, she did experience some "jitteriness and chilling" the days that she received her 2 Neupogen injections.  She also had a headache.  She did check her temperature and there was none.  Today, she is feeling fine.  Her appetite has been good, though her sense of taste is definitely off.  She denies any diarrhea or constipation issues.  No neuropathy symptoms whatsoever.  REVIEW OF SYSTEMS:  Otherwise negative.  ALLERGIES:  No known drug allergies.  CURRENT MEDICATIONS:  As per EMR.  PERFORMANCE STATUS:  ECOG status of 1.  PHYSICAL EXAMINATION:  Vital Signs:  Blood pressure is 126/73.  Pulse 89.  Respirations 20.  Temp 98.3.  Weight 175 pounds.  HEENT: Conjunctivae are pink.  Sclerae are anicteric.  Oropharynx is benign without oral mucositis or candidosis.  Lungs:  Clear to auscultation without wheezing or rhonchi.  Heart:  Regular rate and rhythm without murmurs, rubs, gallops, or clicks.  Abdomen:  Soft and nontender without organomegaly.  Normal bowel sounds.  Extremities:  Benign.  Neurologic Exam:  Nonfocal.  LABORATORY DATA:  Hemoglobin 11.2 g, platelet count 263,000, WBC  11,900 with an ANC of 10,100.  IMPRESSION: 66. A 59 year old, [place], [name] with a history of     a stage I, high-grade, ER/PR positive, HER-2 negative infiltrating     ductal carcinoma of the left breast for which she underwent a left     modified radical mastectomy with axillary lymph node dissection and     a prophylactic right simple mastectomy.  She is participating in     the NSABP B-49 protocol for which she has completed 4 cycles of     dose-dense Adriamycin/Cytoxan, now due for week 11 of 12 planned     Taxol. 2. History of neutropenia for which she does receive intermittent     Neupogen injections, to be dosed on 07/02/2011 and 07/03/2011 this     go round. The case was reviewed with Dr. Welton Flakes.  PLAN:  Krystal [name] will receive treatment today as scheduled.  Her Decadron is at 10 mg.  She will return on 07/02/2011 and 07/03/2011 for a 480 mcg injection of Neupogen.  Followup, though, officially in 1 week's time prior to her 12th and final week of treatment.  She will also be referred to Dr. Chipper Herb for definitive radiation planning.    ______________________________ Sharyl Nimrod, PA CS/MEDQ  D:  06/28/2011  T:  06/28/2011  Job:  [id number]

## 2011-06-28 NOTE — Progress Notes (Signed)
Progress note dictated-CTS 

## 2011-06-28 NOTE — Telephone Encounter (Signed)
gave patient appointments for injection on 07-02-2011 and 07-03-2011 printed out calendar and gave to the patient

## 2011-07-02 ENCOUNTER — Ambulatory Visit (HOSPITAL_BASED_OUTPATIENT_CLINIC_OR_DEPARTMENT_OTHER): Payer: Medicare Other

## 2011-07-02 VITALS — BP 115/72 | HR 76 | Temp 98.6°F

## 2011-07-02 DIAGNOSIS — C50919 Malignant neoplasm of unspecified site of unspecified female breast: Secondary | ICD-10-CM

## 2011-07-02 DIAGNOSIS — C50219 Malignant neoplasm of upper-inner quadrant of unspecified female breast: Secondary | ICD-10-CM

## 2011-07-02 MED ORDER — FILGRASTIM 480 MCG/0.8ML IJ SOLN
480.0000 ug | Freq: Once | INTRAMUSCULAR | Status: AC
  Administered 2011-07-02: 480 ug via SUBCUTANEOUS
  Filled 2011-07-02: qty 0.8

## 2011-07-03 ENCOUNTER — Ambulatory Visit (HOSPITAL_BASED_OUTPATIENT_CLINIC_OR_DEPARTMENT_OTHER): Payer: Medicare Other

## 2011-07-03 VITALS — BP 101/61 | HR 87 | Temp 99.2°F

## 2011-07-03 DIAGNOSIS — C50219 Malignant neoplasm of upper-inner quadrant of unspecified female breast: Secondary | ICD-10-CM

## 2011-07-03 DIAGNOSIS — C50919 Malignant neoplasm of unspecified site of unspecified female breast: Secondary | ICD-10-CM

## 2011-07-03 MED ORDER — FILGRASTIM 480 MCG/0.8ML IJ SOLN
480.0000 ug | Freq: Once | INTRAMUSCULAR | Status: AC
  Administered 2011-07-03: 480 ug via SUBCUTANEOUS
  Filled 2011-07-03: qty 0.8

## 2011-07-03 NOTE — Progress Notes (Signed)
ER.Marland Kitchen....POSITIVE  PR......POSITIVE  HER 2 NEU.....Marland KitchenNEG

## 2011-07-04 ENCOUNTER — Ambulatory Visit
Admission: RE | Admit: 2011-07-04 | Discharge: 2011-07-04 | Disposition: A | Discharge status: Home / Self Care | Payer: Medicare Other | Source: Ambulatory Visit | Attending: Radiation Oncology | Admitting: Radiation Oncology

## 2011-07-04 ENCOUNTER — Encounter: Payer: Self-pay | Admitting: Radiation Oncology

## 2011-07-04 VITALS — BP 113/65 | HR 88 | Temp 97.8°F | Resp 18 | Ht 63.0 in | Wt 178.0 lb

## 2011-07-04 DIAGNOSIS — Z9010 Acquired absence of unspecified breast and nipple: Secondary | ICD-10-CM | POA: Insufficient documentation

## 2011-07-04 DIAGNOSIS — C50919 Malignant neoplasm of unspecified site of unspecified female breast: Secondary | ICD-10-CM | POA: Insufficient documentation

## 2011-07-04 DIAGNOSIS — Z170 Estrogen receptor positive status [ER+]: Secondary | ICD-10-CM | POA: Insufficient documentation

## 2011-07-04 DIAGNOSIS — C773 Secondary and unspecified malignant neoplasm of axilla and upper limb lymph nodes: Secondary | ICD-10-CM

## 2011-07-04 NOTE — Progress Notes (Addendum)
Followup note:  Diagnosis stage IIA (T1, N1, M0) high-grade invasive mammary carcinoma of the left breast  The patient returns today for review and discussion of post mastectomy radiation therapy. I saw Krystal [name] in consultation on 11/22/2010 at which time she presented with a left breast mass. She underwent excisional biopsy on 10/23/2010 was noted to have a 0.6 cm high-grade invasive mammary carcinoma. Her tumor was ER positive, PR negative and HER-2/neu negative. She would not have a left modified radical mastectomy with Dr. Abbey Chatters and there is no evidence for residual disease within the left breast. One of 15 lymph nodes contain metastatic disease and this deposit was 1.3 cm. There was no extracapsular extension. The initial proliferation rate was 95%. She also had a right simple mastectomy and 3 lymph nodes were negative. I saw her for a followup visit on July 17 I discussed the role of post mastectomy radiation therapy based on the available data. She is without complaints today. She has one cycle of chemotherapy left with of Dr. Welton Flakes.  Physical examination: Head and neck examination remarkable for alopecia. Nodes: Without palpable cervical, supraclavicular, or axillary lymphadenopathy. Chest bilateral mastectomy is without visible or palpable evidence for recurrent disease. Back: Without spinal or CVA discomfort. Heart regular in rhythm. Abdomen: Without masses or organomegaly. Extremities: Without edema.   Impression: Stage IIA (T1, N1, M0) high-grade invasive mammary carcinoma of the left breast. Again, I feel that her risk for local regional failure is in the vicinity of 10%. Post mastectomy radiation therapy can be expected to decrease this risk to less than 5% with perhaps as much as a 3% improvement in overall survival. Thus the benefit is borderline. Post mastectomy radiation therapy would certainly affect her breast reconstruction options. After lengthy discussion she is comfortable  without post mastectomy radiation therapy. I presented her case to Dr. Basilio Cairo and Dr. Michell Heinrich, and they are in agreement. She'll see Dr. Abbey Chatters next week, and he may recommend consultation with plastic surgery.  30 minutes was spent face-to-face with the patient, primarily counseling the patient.

## 2011-07-04 NOTE — Progress Notes (Signed)
HAD NEUPOGEN MON AND TUES.  WILL HAVE LAST CHEMO TOMORROW, 07/05/11.  CHEMO IS TAXOL.  CURRENTLY HAS RED SPOTS ON SKIN, MOUTH SORES AND SORE TONGUE FROM TAXOL.  NEUPOGEN CAUSES H/A.  HAS H/A NOW, RATES 1/10.

## 2011-07-05 ENCOUNTER — Other Ambulatory Visit: Payer: Self-pay | Admitting: Oncology

## 2011-07-05 ENCOUNTER — Encounter: Payer: Self-pay | Admitting: Physician Assistant

## 2011-07-05 ENCOUNTER — Other Ambulatory Visit (HOSPITAL_BASED_OUTPATIENT_CLINIC_OR_DEPARTMENT_OTHER): Payer: Medicare Other | Admitting: Lab

## 2011-07-05 ENCOUNTER — Ambulatory Visit (HOSPITAL_BASED_OUTPATIENT_CLINIC_OR_DEPARTMENT_OTHER): Payer: Medicare Other

## 2011-07-05 ENCOUNTER — Telehealth: Payer: Self-pay | Admitting: *Deleted

## 2011-07-05 ENCOUNTER — Ambulatory Visit (HOSPITAL_BASED_OUTPATIENT_CLINIC_OR_DEPARTMENT_OTHER): Payer: Medicare Other | Admitting: Physician Assistant

## 2011-07-05 ENCOUNTER — Encounter: Payer: Self-pay | Admitting: *Deleted

## 2011-07-05 VITALS — BP 115/70 | HR 94 | Temp 98.3°F | Ht 63.0 in | Wt 178.0 lb

## 2011-07-05 DIAGNOSIS — Z5111 Encounter for antineoplastic chemotherapy: Secondary | ICD-10-CM

## 2011-07-05 DIAGNOSIS — C773 Secondary and unspecified malignant neoplasm of axilla and upper limb lymph nodes: Secondary | ICD-10-CM

## 2011-07-05 DIAGNOSIS — C50219 Malignant neoplasm of upper-inner quadrant of unspecified female breast: Secondary | ICD-10-CM

## 2011-07-05 DIAGNOSIS — C50919 Malignant neoplasm of unspecified site of unspecified female breast: Secondary | ICD-10-CM

## 2011-07-05 LAB — CBC WITH DIFFERENTIAL/PLATELET
BASO%: 0.2 % (ref 0.0–2.0)
Eosinophils Absolute: 0.1 10*3/uL (ref 0.0–0.5)
HCT: 33.2 % — ABNORMAL LOW (ref 34.8–46.6)
MCHC: 33.6 g/dL (ref 31.5–36.0)
MONO#: 0.3 10*3/uL (ref 0.1–0.9)
NEUT#: 9.7 10*3/uL — ABNORMAL HIGH (ref 1.5–6.5)
NEUT%: 88.4 % — ABNORMAL HIGH (ref 38.4–76.8)
RBC: 3.42 10*6/uL — ABNORMAL LOW (ref 3.70–5.45)
WBC: 10.9 10*3/uL — ABNORMAL HIGH (ref 3.9–10.3)
lymph#: 0.9 10*3/uL (ref 0.9–3.3)

## 2011-07-05 MED ORDER — SODIUM CHLORIDE 0.9 % IV SOLN
Freq: Once | INTRAVENOUS | Status: AC
  Administered 2011-07-05: 12:00:00 via INTRAVENOUS

## 2011-07-05 MED ORDER — SODIUM CHLORIDE 0.9 % IJ SOLN
10.0000 mL | INTRAMUSCULAR | Status: DC | PRN
  Administered 2011-07-05: 10 mL
  Filled 2011-07-05: qty 10

## 2011-07-05 MED ORDER — HEPARIN SOD (PORK) LOCK FLUSH 100 UNIT/ML IV SOLN
500.0000 [IU] | Freq: Once | INTRAVENOUS | Status: AC | PRN
  Administered 2011-07-05: 500 [IU]
  Filled 2011-07-05: qty 5

## 2011-07-05 MED ORDER — FAMOTIDINE IN NACL 20-0.9 MG/50ML-% IV SOLN
20.0000 mg | Freq: Once | INTRAVENOUS | Status: AC
  Administered 2011-07-05: 20 mg via INTRAVENOUS

## 2011-07-05 MED ORDER — ONDANSETRON 8 MG/50ML IVPB (CHCC)
8.0000 mg | Freq: Once | INTRAVENOUS | Status: AC
  Administered 2011-07-05: 8 mg via INTRAVENOUS

## 2011-07-05 MED ORDER — PACLITAXEL CHEMO INJECTION 300 MG/50ML
80.0000 mg/m2 | Freq: Once | INTRAVENOUS | Status: AC
  Administered 2011-07-05: 144 mg via INTRAVENOUS
  Filled 2011-07-05: qty 24

## 2011-07-05 MED ORDER — DEXAMETHASONE SODIUM PHOSPHATE 10 MG/ML IJ SOLN
10.0000 mg | Freq: Once | INTRAMUSCULAR | Status: AC
  Administered 2011-07-05: 10 mg via INTRAVENOUS

## 2011-07-05 MED ORDER — DIPHENHYDRAMINE HCL 50 MG/ML IJ SOLN
50.0000 mg | Freq: Once | INTRAMUSCULAR | Status: AC
  Administered 2011-07-05: 50 mg via INTRAVENOUS

## 2011-07-05 NOTE — Progress Notes (Signed)
Krystal [name] is in the cancer center today for physical exam and to receive the 12th treatment with taxol. Her CBC/diff is WNL for treatment. She was seen by Blake Divine, PA.  [name] reports she is feeling well and is able to participate in her usual chemotherapy activities. She does have a small red rash on her lower arms. There is no itching. This was evaluated by Ms. Scherer.  She also reports she has just begun to notice a little tingling in her toes. It is intermittent and does not impact activity.  She will receive her last dose of taxol today. Sign for infusion given to E. Manning Charity, RN.

## 2011-07-05 NOTE — Progress Notes (Signed)
DIAGNOSES: 24. A 60 year old [place], [name] with a history of a     T1 N0, high-grade invasive ductal carcinoma of the left breast,     status post left modified radical mastectomy with prophylactic     right mastectomy.  Tumor noted to be ER/PR positive, HER-2     negative.  She is participating in NSABP B-49 clinical trial for     which she has completed 4 cycles of dose-dense AC and is now due     for week 12 of 12 planned single-agent Taxol. 2. She has had Neupogen support for her history of neutropenia.  SUBJECTIVE:  Mrs. Salas is seen today prior to her final weekly dose of single-agent Taxol given in the adjuvant setting for her history of an ER/PR positive, HER-2 negative, stage I, high-grade invasive ductal carcinoma of the left breast.  She is feeling quite well.  She denies any unexplained fevers, chills, night sweats, shortness of breath, or chest pain.  No nausea, emesis, diarrhea, or constipation issues at this point in time.  She has had very vague fleeting tingling of her toes which easily resolves with moving her toes.  It is not interfering with her gait whatsoever.  REVIEW OF SYSTEMS:  Otherwise negative.  ALLERGIES:  No known drug allergies.  CURRENT MEDICATIONS:  As per EMR.  PERFORMANCE STATUS:  ECOG status of 1.  PHYSICAL EXAMINATION:  Vital Signs:  Blood pressure is 115/70.  Pulse 94.  Respirations 20.  Temp 98.3.  Weight 178 pounds.  HEENT: Conjunctivae are pink.  Sclerae are anicteric.  Oropharynx is benign without mucositis or candidosis.  Lungs:  Clear to auscultation.  No evidence of wheezing or rhonchi.  Heart:  Regular rate and rhythm without murmurs, rubs, gallops, or clicks.  Abdomen:  Soft, nontender without organomegaly.  Normal bowel sounds.  Extremities:  Benign. Neurologic Exam:  Nonfocal.  The patient is alert and orient x3.  LABORATORY DATA:  Hemoglobin 11.1 g, platelet count 326,000, WBC 10,900 with an ANC of  9,700.  IMPRESSION: 21. A 60 year old [place], [name] with a history of a     T1 N0, high-grade invasive ductal carcinoma of the left breast,     status post left modified radical mastectomy with prophylactic     right mastectomy and tumor noted to be ER/PR positive, HER-     2negative with participation NSABP B-49 clinical trial for which     she has completed 4 cycles of dose-dense AC and is due for week 12     of 12 planned single-agent Taxol. 2. Mild grade 1 neuropathy which is quite fleeting and not interfering     with her activities of daily living whatsoever. The case was reviewed with Dr. Welton Flakes.  PLAN:  Mrs. Dutan will receive treatment today as scheduled.  She will followup with Dr. Welton Flakes in the 1st week of January to discuss adjuvant hormonal therapy.  We will not utilize Neupogen support since this is her final cycle of treatment.  She understands and agrees with this plan.    ______________________________ Sharyl Nimrod, PA CS/MEDQ  D:  07/05/2011  T:  07/05/2011  Job:  [id number]

## 2011-07-05 NOTE — Telephone Encounter (Signed)
gave patient appointment for 07-25-2011 starting at 2:30pm

## 2011-07-05 NOTE — Progress Notes (Signed)
Progress note dictated-CTS 

## 2011-07-10 ENCOUNTER — Ambulatory Visit (INDEPENDENT_AMBULATORY_CARE_PROVIDER_SITE_OTHER): Payer: Medicare Other | Admitting: General Surgery

## 2011-07-10 ENCOUNTER — Encounter (INDEPENDENT_AMBULATORY_CARE_PROVIDER_SITE_OTHER): Payer: Self-pay | Admitting: General Surgery

## 2011-07-10 VITALS — BP 122/86 | HR 84 | Temp 97.4°F | Resp 16 | Ht 63.0 in | Wt 172.2 lb

## 2011-07-10 DIAGNOSIS — C50919 Malignant neoplasm of unspecified site of unspecified female breast: Secondary | ICD-10-CM

## 2011-07-10 NOTE — Patient Instructions (Signed)
We can discuss referral to a Plastic Surgeon at your next visit.

## 2011-07-10 NOTE — Progress Notes (Signed)
Operation: Bilateral mastectomies the left of which was a modified radical mastectomy.  Date:  12/28/2010  Stage: TxN1   HPI:  Ms. Hardiman is here for followup of her left breast cancer. She has completed her chemotherapy and is interested in having her Portacath removed. No adenopathy. No chest wall nodules.  She does c/o fullness under her arms bilaterally.  PE: Gen.-well-developed well-nourished. She has alopecia  Chest-bilateral chest incision with no nodularity; redundant tissue and swelling right side greater than left side.  Lymph nodes-no palpable cervical, super clavicular, or axillary adenopathy.  Assessment:  Left breast cancer metastatic to lymph nodes- she is finished with her chemotherapy; still has some residual swelling and redundant skin the right side greater than left side.  Plan: Port-A-Cath removal as outpatient.  The procedure and risks (including but not limited to bleeding, infection, wound healing problem) were explained to her and her husband. They seem to understand and agree to proceed.    As for the redundant skin laterally and post chest wall some asymmetry she would like to see a plastic surgeon but we'll wait until her next visit before we arrange that.

## 2011-07-25 ENCOUNTER — Ambulatory Visit: Payer: Medicare Other | Admitting: Oncology

## 2011-07-25 ENCOUNTER — Other Ambulatory Visit: Payer: Medicare Other | Admitting: Lab

## 2011-07-27 ENCOUNTER — Telehealth: Payer: Self-pay | Admitting: Oncology

## 2011-07-27 ENCOUNTER — Ambulatory Visit (HOSPITAL_BASED_OUTPATIENT_CLINIC_OR_DEPARTMENT_OTHER): Payer: Medicare Other | Admitting: Oncology

## 2011-07-27 ENCOUNTER — Other Ambulatory Visit: Payer: Medicare Other | Admitting: Lab

## 2011-07-27 ENCOUNTER — Encounter: Payer: Self-pay | Admitting: *Deleted

## 2011-07-27 ENCOUNTER — Encounter: Payer: Self-pay | Admitting: Oncology

## 2011-07-27 VITALS — BP 135/77 | HR 78 | Temp 97.7°F | Ht 63.0 in | Wt 178.5 lb

## 2011-07-27 DIAGNOSIS — C50919 Malignant neoplasm of unspecified site of unspecified female breast: Secondary | ICD-10-CM

## 2011-07-27 DIAGNOSIS — C773 Secondary and unspecified malignant neoplasm of axilla and upper limb lymph nodes: Secondary | ICD-10-CM

## 2011-07-27 DIAGNOSIS — R609 Edema, unspecified: Secondary | ICD-10-CM

## 2011-07-27 HISTORY — DX: Edema, unspecified: R60.9

## 2011-07-27 LAB — CBC WITH DIFFERENTIAL/PLATELET
Basophils Absolute: 0 10*3/uL (ref 0.0–0.1)
EOS%: 0 % (ref 0.0–7.0)
HGB: 11.9 g/dL (ref 11.6–15.9)
LYMPH%: 9.4 % — ABNORMAL LOW (ref 14.0–49.7)
MCH: 32 pg (ref 25.1–34.0)
MCV: 94.2 fL (ref 79.5–101.0)
MONO%: 9.8 % (ref 0.0–14.0)
RDW: 17.8 % — ABNORMAL HIGH (ref 11.2–14.5)

## 2011-07-27 LAB — COMPREHENSIVE METABOLIC PANEL
AST: 21 U/L (ref 0–37)
Albumin: 3.9 g/dL (ref 3.5–5.2)
Alkaline Phosphatase: 62 U/L (ref 39–117)
BUN: 12 mg/dL (ref 6–23)
Creatinine, Ser: 0.79 mg/dL (ref 0.50–1.10)
Potassium: 3.7 mEq/L (ref 3.5–5.3)
Total Bilirubin: 0.4 mg/dL (ref 0.3–1.2)

## 2011-07-27 MED ORDER — ANASTROZOLE 1 MG PO TABS: 1.0000 mg | ORAL_TABLET | Freq: Every day | ORAL | Status: AC

## 2011-07-27 NOTE — Progress Notes (Signed)
07/27/2011 10:55 Krystal [name] is in the cancer center today for lab work and physical exam. She reports she is having difficulty sleeping, waking up after 4 hours of sleep. She is napping in the afternoon and feels refreshed after the nap.  She reports some fatigue, taste changes, some tingling in her toes which seems to be relieved when she moves them. She is independent in ADLs and continues to drive.   She knows she will be seen every 6 months in follow up for the study. Dr. Welton Flakes will see her in 3 months to evaluate her tolerance of her prescribed AI.

## 2011-07-27 NOTE — Telephone Encounter (Signed)
Gv pt appt for april2013 °

## 2011-07-27 NOTE — Progress Notes (Signed)
OFFICE PROGRESS NOTE  CC  [name],[name], MD [address]   [place] [address]  DIAGNOSIS: 61 year old female with stage II invasive ductal carcinoma of the left breast status post mastectomy of the left breast with prophylactic right mastectomy.  PRIOR THERAPY:  #1 patient is status post bilateral mastectomies. She underwent a therapeutic left mastectomy for a stage II high-grade invasive ductal carcinoma that was ER positive PR positive HER-2/neu negative. Patient also had a prophylactic right mastectomy.  #2 patient is now status post adjuvant chemotherapy following NSABP B. 49 clinical trial. She received 4 cycles of dose dense Adriamycin and Cytoxan followed by weekly single agent Taxol completed December 2012.  #3 patient will not begin adjuvant antiestrogen therapy consisting of Arimidex 1 mg daily. A total of 5 years therapy is planned.  CURRENT THERAPY:patient to begin Arimidex 1 mg daily  INTERVAL HISTORY: Krystal [name] 61 y.o. female returns for followup visit today. Overall she is doing well. However as a consequence of the treatments patient has developed very poor taste and is not able to keep much. She is also noticed increasing swelling in her lower extremities. She has considerable fatigue. She is also having some difficulty sleeping at night. She denies any fevers chills. She does have significant hot flashes. She is denying any myalgias or arthralgias. She denies any peripheral paresthesias. She has no hematuria hematochezia melena hemoptysis hematemesis no shortness of breath no chest pains or palpitations. Remainder of the 10 point review of systems is negative.  MEDICAL HISTORY: Past Medical History  Diagnosis Date  . Fibromyalgia     muscle weakness and pain  . Depression   . Hypertension     benign  . Hyperlipidemia   . Rotator cuff rupture   . Arthritis     osteo  . Synovitis   . Rosacea   . Cataract   . Plantar fasciitis   . Ulcer   .  Blindness of right eye at birth  . Osteoporosis   . Restless leg syndrome   . Anemia   . Edema 07/27/2011    ALLERGIES:   has no known allergies.  MEDICATIONS:  Current Outpatient Prescriptions  Medication Sig Dispense Refill  . alendronate (FOSAMAX) 70 MG tablet Take 70 mg by mouth every 7 (seven) days. Take with a full glass of water on an empty stomach.       Marland Kitchen amLODipine (NORVASC) 10 MG tablet Take 10 mg by mouth daily.        Marland Kitchen anastrozole (ARIMIDEX) 1 MG tablet Take 1 tablet (1 mg total) by mouth daily.  30 tablet  2  . atenolol (TENORMIN) 100 MG tablet Take 100 mg by mouth daily.        . furosemide (LASIX) 20 MG tablet Take 20 mg by mouth 2 (two) times daily.        Marland Kitchen lisinopril (PRINIVIL,ZESTRIL) 20 MG tablet Take 20 mg by mouth daily.        . methylphenidate (RITALIN LA) 20 MG 24 hr capsule Take 20 mg by mouth every morning.        . potassium chloride SA (K-DUR,KLOR-CON) 20 MEQ tablet Take 20 mEq by mouth daily.        . pregabalin (LYRICA) 300 MG capsule Take 300 mg by mouth 2 (two) times daily.        . ranitidine (ZANTAC) 150 MG tablet Take 150 mg by mouth 2 (two) times daily.        Marland Kitchen  zolpidem (AMBIEN CR) 12.5 MG CR tablet Take 10 mg by mouth at bedtime as needed. Pt takes 10mg  tablet at bedtime         SURGICAL HISTORY:  Past Surgical History  Procedure Date  . Abdominal hysterectomy 1986    For bleeding and pain  . Eye surgery   . Bladder surgery 2006    Bladder tack  . Throat surgery   . Nose surgery 2007  . Carpal tunnel release   . Neuroma surgery     REVIEW OF SYSTEMS:  Pertinent items are noted in HPI.   PHYSICAL EXAMINATION: General appearance: alert, cooperative and appears stated age Head: Normocephalic, without obvious abnormality, atraumatic Neck: no adenopathy, no carotid bruit, no JVD, supple, symmetrical, trachea midline and thyroid not enlarged, symmetric, no tenderness/mass/nodules Lymph nodes: Cervical, supraclavicular, and axillary nodes  normal. Resp: clear to auscultation bilaterally and normal percussion bilaterally Back: symmetric, no curvature. ROM normal. No CVA tenderness. Cardio: regular rate and rhythm, S1, S2 normal, no murmur, click, rub or gallop GI: soft, non-tender; bowel sounds normal; no masses,  no organomegaly Extremities: edema Bilaterally.. It is nonpitting Neurologic: Alert and oriented X 3, normal strength and tone. Normal symmetric reflexes. Normal coordination and gait  ECOG PERFORMANCE STATUS: 1 - Symptomatic but completely ambulatory  Blood pressure 135/77, pulse 78, temperature 97.7 F (36.5 C), height 5\' 3"  (1.6 m), weight 178 lb 8 oz (80.967 kg).  LABORATORY DATA: Lab Results  Component Value Date   WBC 11.4* 07/27/2011   HGB 11.9 07/27/2011   HCT 35.1 07/27/2011   MCV 94.2 07/27/2011   PLT 358 07/27/2011      Chemistry      Component Value Date/Time   NA 134* 06/28/2011 0945   K 4.0 06/28/2011 0945   CL 99 06/28/2011 0945   CO2 25 06/28/2011 0945   BUN 11 06/28/2011 0945   CREATININE 0.77 06/28/2011 0945      Component Value Date/Time   CALCIUM 8.4 06/28/2011 0945   ALKPHOS 84 06/28/2011 0945   AST 19 06/28/2011 0945   ALT 30 06/28/2011 0945   BILITOT 0.4 06/28/2011 0945       RADIOGRAPHIC STUDIES:  No results found.  ASSESSMENT: 61 year old female with stage II high-grade invasive ductal carcinoma with a positive lymph node patient is status post bilateral mastectomy followed by adjuvant chemotherapy following NSABP B. 49 clinical study. She has completed all of her therapy because her tumor was ER positive PR she will now proceed with adjuvant antiestrogen therapy. Risks and benefits of Arimidex were discussed with the patient in detail. She has had a bone density scan performed and she is going to get me a copy of it. She is also exercising walking and takes vitamin D. Patient does have considerable amount of hot flashes and I do think that her hot flashes may increase 1 she go goes on the  antiestrogen care. We discussed the possibility of her going on an SSRI to symptomatically reduce her flushing.   PLAN: prescription for Arimidex was sent to her pharmacy. She will be seen back in 3 months time. However she knows to call me sooner if need arises. And we will see her prior to her scheduled appointment.   All questions were answered. The patient knows to call the clinic with any problems, questions or concerns. We can certainly see the patient much sooner if necessary.  I spent 25 minutes counseling the patient face to face. The total time spent in  the appointment was 30 minutes.    Drue Second, MD Medical/Oncology [facility] [phone] (beeper) [phone] (Office)  07/27/2011, 9:51 AM

## 2011-08-02 DIAGNOSIS — Z452 Encounter for adjustment and management of vascular access device: Secondary | ICD-10-CM

## 2011-08-23 ENCOUNTER — Telehealth: Payer: Self-pay | Admitting: Oncology

## 2011-08-23 ENCOUNTER — Telehealth: Payer: Self-pay | Admitting: *Deleted

## 2011-08-23 ENCOUNTER — Other Ambulatory Visit: Payer: Self-pay | Admitting: *Deleted

## 2011-08-23 DIAGNOSIS — C50919 Malignant neoplasm of unspecified site of unspecified female breast: Secondary | ICD-10-CM

## 2011-08-23 NOTE — Telephone Encounter (Signed)
Pt has swelling/pain to both legs, . Reviewed with MD per MD pt to be seen tomorrow 0/2/01/13 at 1pm with labs. Notified Cystal in scheduling who adivsed she will call pt.

## 2011-08-23 NOTE — Telephone Encounter (Signed)
S/w the pt and she is aware of her appts on 08/24/2011@12 :30pm

## 2011-08-24 ENCOUNTER — Ambulatory Visit (HOSPITAL_BASED_OUTPATIENT_CLINIC_OR_DEPARTMENT_OTHER): Payer: Medicare Other | Admitting: Oncology

## 2011-08-24 ENCOUNTER — Other Ambulatory Visit (HOSPITAL_BASED_OUTPATIENT_CLINIC_OR_DEPARTMENT_OTHER): Payer: Medicare Other | Admitting: Lab

## 2011-08-24 VITALS — BP 154/84 | HR 80 | Temp 98.1°F | Ht 63.0 in | Wt 192.3 lb

## 2011-08-24 DIAGNOSIS — C50919 Malignant neoplasm of unspecified site of unspecified female breast: Secondary | ICD-10-CM

## 2011-08-24 DIAGNOSIS — Z79811 Long term (current) use of aromatase inhibitors: Secondary | ICD-10-CM

## 2011-08-24 DIAGNOSIS — R600 Localized edema: Secondary | ICD-10-CM

## 2011-08-24 DIAGNOSIS — Z170 Estrogen receptor positive status [ER+]: Secondary | ICD-10-CM

## 2011-08-24 DIAGNOSIS — R609 Edema, unspecified: Secondary | ICD-10-CM

## 2011-08-24 LAB — BASIC METABOLIC PANEL
CO2: 28 mEq/L (ref 19–32)
Chloride: 100 mEq/L (ref 96–112)
Glucose, Bld: 92 mg/dL (ref 70–99)
Potassium: 3.7 mEq/L (ref 3.5–5.3)
Sodium: 140 mEq/L (ref 135–145)

## 2011-08-26 NOTE — Progress Notes (Signed)
OFFICE PROGRESS NOTE  CC  [name],[name], MD, MD [address]   [place] [address]  DIAGNOSIS: 61 year old female with stage II invasive ductal carcinoma of the left breast status post mastectomy of the left breast with prophylactic right mastectomy.  PRIOR THERAPY:  #1 patient is status post bilateral mastectomies. She underwent a therapeutic left mastectomy for a stage II high-grade invasive ductal carcinoma that was ER positive PR positive HER-2/neu negative. Patient also had a prophylactic right mastectomy.  #2 patient is now status post adjuvant chemotherapy following NSABP B. 49 clinical trial. She received 4 cycles of dose dense Adriamycin and Cytoxan followed by weekly single agent Taxol completed December 2012.  #3 patient will not begin adjuvant antiestrogen therapy consisting of Arimidex 1 mg daily. A total of 5 years therapy is planned.  CURRENT THERAPY:patient to begin Arimidex 1 mg daily  INTERVAL HISTORY: Krystal [name] 61 y.o. female returns for followup visit today. Patient is seen at her request today. She had called my nurse yesterday with complaints of significant edema of her lower extremities as well as ongoing edema of her face. She denies having any shortness of breath or chest pains or difficulty in swallowing. She is just very concerned that she seems to have on anasarca since completing all of her chemotherapy. Apparently this had been an issue prior to her diagnosis of breast cancer. She was and is on Lasix twice a day. However in spite of that patient states that she is unable to get rid of the fluid that has accumulated. But she remains asymptomatic from the fluid accumulation in terms of cardiovascular and pulmonary symptoms. She is sleeping well at night. She has no orthropnea or PND him. Remainder of the 10 point review of systems is negative.  MEDICAL HISTORY: Past Medical History  Diagnosis Date  . Fibromyalgia     muscle weakness and pain  .  Depression   . Hypertension     benign  . Hyperlipidemia   . Rotator cuff rupture   . Arthritis     osteo  . Synovitis   . Rosacea   . Cataract   . Plantar fasciitis   . Ulcer   . Blindness of right eye at birth  . Osteoporosis   . Restless leg syndrome   . Anemia   . Edema 07/27/2011    ALLERGIES:   has no known allergies.  MEDICATIONS:  Current Outpatient Prescriptions  Medication Sig Dispense Refill  . alendronate (FOSAMAX) 70 MG tablet Take 70 mg by mouth every 7 (seven) days. Take with a full glass of water on an empty stomach.       Marland Kitchen amLODipine (NORVASC) 10 MG tablet Take 10 mg by mouth daily.        Marland Kitchen anastrozole (ARIMIDEX) 1 MG tablet Take 1 tablet (1 mg total) by mouth daily.  30 tablet  2  . atenolol (TENORMIN) 100 MG tablet Take 100 mg by mouth daily.        . furosemide (LASIX) 20 MG tablet Take 20 mg by mouth 2 (two) times daily.        Marland Kitchen lisinopril (PRINIVIL,ZESTRIL) 20 MG tablet Take 20 mg by mouth daily.        . potassium chloride SA (K-DUR,KLOR-CON) 20 MEQ tablet Take 20 mEq by mouth 2 (two) times daily.      . pregabalin (LYRICA) 300 MG capsule Take 300 mg by mouth 2 (two) times daily.        Marland Kitchen  ranitidine (ZANTAC) 150 MG tablet Take 150 mg by mouth 2 (two) times daily.        Marland Kitchen zolpidem (AMBIEN CR) 12.5 MG CR tablet Take 10 mg by mouth at bedtime as needed. Pt takes 10mg  tablet at bedtime         SURGICAL HISTORY:  Past Surgical History  Procedure Date  . Abdominal hysterectomy 1986    For bleeding and pain  . Eye surgery   . Bladder surgery 2006    Bladder tack  . Throat surgery   . Nose surgery 2007  . Carpal tunnel release   . Neuroma surgery     REVIEW OF SYSTEMS:  Pertinent items are noted in HPI.   PHYSICAL EXAMINATION: General appearance: alert, cooperative and appears stated age Head: Normocephalic, without obvious abnormality, atraumatic Neck: no adenopathy, no carotid bruit, no JVD, supple, symmetrical, trachea midline and thyroid not  enlarged, symmetric, no tenderness/mass/nodules Lymph nodes: Cervical, supraclavicular, and axillary nodes normal. Resp: clear to auscultation bilaterally and normal percussion bilaterally Back: symmetric, no curvature. ROM normal. No CVA tenderness. Cardio: regular rate and rhythm, S1, S2 normal, no murmur, click, rub or gallop GI: soft, non-tender; bowel sounds normal; no masses,  no organomegaly Extremities: edema Bilaterally.. It is nonpitting Neurologic: Alert and oriented X 3, normal strength and tone. Normal symmetric reflexes. Normal coordination and gait  ECOG PERFORMANCE STATUS: 1 - Symptomatic but completely ambulatory  Blood pressure 154/84, pulse 80, temperature 98.1 F (36.7 C), temperature source Oral, height 5\' 3"  (1.6 m), weight 192 lb 4.8 oz (87.227 kg).  LABORATORY DATA: Lab Results  Component Value Date   WBC 11.4* 07/27/2011   HGB 11.9 07/27/2011   HCT 35.1 07/27/2011   MCV 94.2 07/27/2011   PLT 358 07/27/2011      Chemistry      Component Value Date/Time   NA 140 08/24/2011 1237   K 3.7 08/24/2011 1237   CL 100 08/24/2011 1237   CO2 28 08/24/2011 1237   BUN 16 08/24/2011 1237   CREATININE 0.73 08/24/2011 1237      Component Value Date/Time   CALCIUM 9.4 08/24/2011 1237   ALKPHOS 62 07/27/2011 0838   AST 21 07/27/2011 0838   ALT 26 07/27/2011 0838   BILITOT 0.4 07/27/2011 0838       RADIOGRAPHIC STUDIES:  No results found.  ASSESSMENT: 61 year old female with:  #1 stage II high-grade invasive ductal carcinoma with a positive lymph node patient is status post bilateral mastectomy followed by adjuvant chemotherapy following NSABP B. 49 clinical study.   #2 She has completed all of her therapy because her tumor was ER positive PR she will now proceed with adjuvant antiestrogen therapy. Risks and benefits of Arimidex were discussed with the patient in detail.   #3 She has had a bone density scan performed and she is going to get me a copy of it. She is also exercising walking  and takes vitamin D. Patient does have considerable amount of hot flashes and I do think that her hot flashes may increase 1 she go goes on the antiestrogen care. We discussed the possibility of her going on an SSRI to symptomatically reduce her flushing.  #4 fluid retention. I have discussed with the patient salt salt retrieve restriction continuing her Lasix and following that with her primary care physician. Certainly the Lasix dosage could be increased or she could have addition of Aldactone but I will allow I will let her primary care physician make the  decisions   PLAN: #1 stage II high-grade invasive ductal carcinoma with positive lymph node. Patient will continue Arimidex 1 mg daily.  #2 fluid retention she will continue to take the Lasix as prescribed and followup with her primary care physician.  #3 patient will be seen back with me in February 2013 as scheduled.  All questions were answered. The patient knows to call the clinic with any problems, questions or concerns. We can certainly see the patient much sooner if necessary.  I spent 25 minutes counseling the patient face to face. The total time spent in the appointment was 30 minutes.    Drue Second, MD Medical/Oncology [facility] [phone] (beeper) [phone] (Office)  08/26/2011, 5:26 PM

## 2011-08-28 ENCOUNTER — Other Ambulatory Visit: Payer: Self-pay | Admitting: *Deleted

## 2011-08-28 NOTE — Telephone Encounter (Signed)
Pt called request  Copy of most recent labs be mailed and Arimidex Rx to be called into her Pharmacy  For 90 day Rx with 4 refills.  Labs mailed to pt.   Will review with MD concerning refill on Arimidex

## 2011-08-30 MED ORDER — ANASTROZOLE 1 MG PO TABS: 1.0000 mg | ORAL_TABLET | Freq: Every day | ORAL | Status: AC

## 2011-08-30 NOTE — Telephone Encounter (Signed)
Rx called into  [facility] Copy of labs mailed to pt per her request

## 2011-10-04 ENCOUNTER — Encounter (INDEPENDENT_AMBULATORY_CARE_PROVIDER_SITE_OTHER): Payer: Self-pay | Admitting: General Surgery

## 2011-10-08 ENCOUNTER — Ambulatory Visit (INDEPENDENT_AMBULATORY_CARE_PROVIDER_SITE_OTHER): Payer: Medicare Other | Admitting: General Surgery

## 2011-10-08 ENCOUNTER — Encounter (INDEPENDENT_AMBULATORY_CARE_PROVIDER_SITE_OTHER): Payer: Self-pay | Admitting: General Surgery

## 2011-10-08 VITALS — BP 126/84 | HR 60 | Temp 96.9°F | Resp 16 | Ht 63.0 in | Wt 180.2 lb

## 2011-10-08 DIAGNOSIS — Z853 Personal history of malignant neoplasm of breast: Secondary | ICD-10-CM

## 2011-10-08 NOTE — Progress Notes (Signed)
Operation: Bilateral mastectomies the left of which was a modified radical mastectomy.  Date:  12/28/2010  Stage: TxN1   HPI:  Krystal [name] is here for followup of her left breast cancer.  Her Porta-a-cath was removed in January. No adenopathy. No chest wall nodules.  She still c/o fullness under her arms bilaterally.  She is having lower extremity swelling and arthralgias and myalgias.  She is taking Arimidex.  PE: Gen.-well-developed well-nourished.   Chest-bilateral chest incision with no nodularity; redundant tissue and swelling right side greater than left side.  Lymph nodes-no palpable cervical, super clavicular, or axillary adenopathy.  Assessment:  Left breast cancer metastatic to lymph nodes- no clinical evidence of recurrence; still has some residual swelling and redundant skin the right side greater than left side.  Plan:  Plastic Surgery referral.  RTC 3 months.

## 2011-10-08 NOTE — Patient Instructions (Signed)
We will arrange for you to have a consultation with a plastic surgeon.

## 2011-10-09 ENCOUNTER — Other Ambulatory Visit (INDEPENDENT_AMBULATORY_CARE_PROVIDER_SITE_OTHER): Payer: Self-pay | Admitting: General Surgery

## 2011-10-09 ENCOUNTER — Encounter: Payer: Self-pay | Admitting: *Deleted

## 2011-10-09 ENCOUNTER — Telehealth (INDEPENDENT_AMBULATORY_CARE_PROVIDER_SITE_OTHER): Payer: Self-pay

## 2011-10-09 DIAGNOSIS — C50919 Malignant neoplasm of unspecified site of unspecified female breast: Secondary | ICD-10-CM

## 2011-10-09 NOTE — Progress Notes (Unsigned)
NOTIFIED PT, PER MD, THAT PT IS TO D/C ANASTROZOLE DUE TO JOINT ACHES AND PAINS FOR 3 WEEKS THEN WILL BEGIN AROMASIN. PT STATES THAT SHE IS GOING TO [place] UNTIL 11/06/11. PT WILL CALL THIS DESK WHEN SHE RETURNS FROM VACATION. WILL BEGIN NEW AI AT THAT TIME

## 2011-10-09 NOTE — Telephone Encounter (Signed)
Left message on VM for pt to call.  Appointment is scheduled with Dr. Odis Luster on October 15, 2011 at 3:30 with a 3:15 arrival.

## 2011-10-10 ENCOUNTER — Telehealth: Payer: Self-pay | Admitting: *Deleted

## 2011-10-10 NOTE — Telephone Encounter (Signed)
called patient patient confirmed over the phone the new date and time on 11-22-2011 starting at 10:00 am

## 2011-11-09 ENCOUNTER — Other Ambulatory Visit: Payer: Medicare Other | Admitting: Lab

## 2011-11-09 ENCOUNTER — Ambulatory Visit: Payer: Medicare Other | Admitting: Oncology

## 2011-11-13 ENCOUNTER — Telehealth (INDEPENDENT_AMBULATORY_CARE_PROVIDER_SITE_OTHER): Payer: Self-pay | Admitting: General Surgery

## 2011-11-13 NOTE — Telephone Encounter (Signed)
Pt calling to report she met with Dr. Odella Aquas to discuss removal of tissue.  She states he told her she needed to lose weight and exercise for a year, then come back to discuss it again.  Appt made for long-term F/U for 12/11/11.

## 2011-11-22 ENCOUNTER — Telehealth (INDEPENDENT_AMBULATORY_CARE_PROVIDER_SITE_OTHER): Payer: Self-pay

## 2011-11-22 NOTE — Telephone Encounter (Signed)
LMOV pt's appt moved from 12/11/11 to 11/30/11 at 3:00pm.

## 2011-11-27 ENCOUNTER — Encounter (INDEPENDENT_AMBULATORY_CARE_PROVIDER_SITE_OTHER): Payer: Self-pay | Admitting: General Surgery

## 2011-11-28 ENCOUNTER — Telehealth: Payer: Self-pay | Admitting: *Deleted

## 2011-11-28 NOTE — Telephone Encounter (Signed)
spoke with patient will come in on 11-29-2011 at 10:30 lab 11:00am dr. Colman Cater patient confirmed over the phone

## 2011-11-29 ENCOUNTER — Telehealth: Payer: Self-pay | Admitting: *Deleted

## 2011-11-29 ENCOUNTER — Ambulatory Visit (HOSPITAL_BASED_OUTPATIENT_CLINIC_OR_DEPARTMENT_OTHER): Payer: Medicare Other | Admitting: Family

## 2011-11-29 ENCOUNTER — Encounter: Payer: Self-pay | Admitting: Family

## 2011-11-29 ENCOUNTER — Other Ambulatory Visit (HOSPITAL_BASED_OUTPATIENT_CLINIC_OR_DEPARTMENT_OTHER): Payer: Medicare Other | Admitting: Lab

## 2011-11-29 VITALS — BP 105/70 | HR 76 | Temp 97.8°F | Ht 63.0 in | Wt 172.4 lb

## 2011-11-29 DIAGNOSIS — IMO0002 Reserved for concepts with insufficient information to code with codable children: Secondary | ICD-10-CM

## 2011-11-29 DIAGNOSIS — R635 Abnormal weight gain: Secondary | ICD-10-CM

## 2011-11-29 DIAGNOSIS — E663 Overweight: Secondary | ICD-10-CM

## 2011-11-29 DIAGNOSIS — M2550 Pain in unspecified joint: Secondary | ICD-10-CM

## 2011-11-29 DIAGNOSIS — C50919 Malignant neoplasm of unspecified site of unspecified female breast: Secondary | ICD-10-CM

## 2011-11-29 LAB — COMPREHENSIVE METABOLIC PANEL
ALT: 19 U/L (ref 0–35)
AST: 20 U/L (ref 0–37)
Chloride: 98 mEq/L (ref 96–112)
Creatinine, Ser: 0.96 mg/dL (ref 0.50–1.10)
Sodium: 138 mEq/L (ref 135–145)
Total Bilirubin: 0.7 mg/dL (ref 0.3–1.2)
Total Protein: 6.9 g/dL (ref 6.0–8.3)

## 2011-11-29 MED ORDER — EXEMESTANE 25 MG PO TABS: 25.0000 mg | ORAL_TABLET | Freq: Every day | ORAL | Status: AC

## 2011-11-29 MED ORDER — EXEMESTANE 25 MG PO TABS: 25.0000 mg | ORAL_TABLET | Freq: Every day | ORAL | Status: DC

## 2011-11-29 NOTE — Progress Notes (Signed)
OFFICE PROGRESS NOTE  DIAGNOSIS: Stage II invasive ductal carcinoma, left breast   PRIOR THERAPY:  1. Bilateral mastectomies. Left mastectomy for Stage II high-grade invasive ductal carcinoma, ER positive PR positive HER-2/neu negative. Also had prophylactic right mastectomy. 2. Adjuvant chemotherapy following NSABP B-49 clinical trial. Received 4 cycles of dose dense Adriamycin and Cytoxan followed by weekly single agent Taxol, completed December 2012. 3. Adjuvant antiestrogen therapy consisting of Arimidex 1 mg daily started 2.1.13. A total of 5 years therapy is planned.  CURRENT THERAPY:Has discontinued Arimidex 1 mg daily for arthralgias.   INTERVAL HISTORY: Here for earlier than scheduled follow-up, called the office reporting arthralgias with initiation of Arimidex. Was told to discontinue, arthralgias have improved.   Saw Dr. Odis Luster for consult regarding revision of redundant tissue/skin after bilateral mastectomy. He encouraged her to lose weight and return in a year. She is quite distressed by that and may seek second opinion. In the interim, she inquires about nutrition counseling.   Sister has been diagnosed with breast cancer recently and she asks about genetic counseling. I find no record in the chart where she was tested so will make that referral.   No headache or blurred vision. No cough or shortness of breath. No abdominal pain or new bone pain. Bowel and bladder function are normal. Appetite is good, with adequate fluid intake. Remainder of the 10 point  review of systems is negative. Remainder of the 10 point review of systems is negative.  MEDICAL HISTORY: Past Medical History  Diagnosis Date  . Fibromyalgia     muscle weakness and pain  . Depression   . Hypertension     benign  . Hyperlipidemia   . Rotator cuff rupture   . Arthritis     osteo  . Synovitis   . Rosacea   . Cataract   . Plantar fasciitis   . Ulcer   . Blindness of right eye at birth  .  Osteoporosis   . Restless leg syndrome   . Anemia   . Edema 07/27/2011  . Cancer     Left breast    ALLERGIES:   has no known allergies.  MEDICATIONS:  Current Outpatient Prescriptions  Medication Sig Dispense Refill  . alendronate (FOSAMAX) 70 MG tablet Take 70 mg by mouth every 7 (seven) days. Take with a full glass of water on an empty stomach.       Marland Kitchen amLODipine (NORVASC) 10 MG tablet Take 10 mg by mouth daily.        Marland Kitchen atenolol (TENORMIN) 100 MG tablet Take 100 mg by mouth daily.        . furosemide (LASIX) 20 MG tablet Take 40 mg by mouth 2 (two) times daily.       Marland Kitchen lisinopril (PRINIVIL,ZESTRIL) 20 MG tablet Take 20 mg by mouth daily.        . potassium chloride SA (K-DUR,KLOR-CON) 20 MEQ tablet Take 20 mEq by mouth 2 (two) times daily.      . pregabalin (LYRICA) 300 MG capsule Take 300 mg by mouth 2 (two) times daily.        . ranitidine (ZANTAC) 150 MG tablet Take 150 mg by mouth 2 (two) times daily.        Marland Kitchen zolpidem (AMBIEN CR) 12.5 MG CR tablet Take 10 mg by mouth at bedtime as needed. Pt takes 10mg  tablet at bedtime       . exemestane (AROMASIN) 25 MG tablet Take 1 tablet (25 mg total) by  mouth daily after breakfast.  30 tablet  2    SURGICAL HISTORY:  Past Surgical History  Procedure Date  . Abdominal hysterectomy 1986    For bleeding and pain  . Eye surgery   . Bladder surgery 2006    Bladder tack  . Throat surgery   . Nose surgery 2007  . Carpal tunnel release   . Neuroma surgery   . Mastectomy 2012    bilateral    REVIEW OF SYSTEMS:  Pertinent items are noted in HPI.   PHYSICAL EXAMINATION: General: Well developed, well nourished, in no acute distress.  EENT: No ocular or oral lesions. No stomatitis.  Respiratory: Lungs are clear to auscultation bilaterally with normal respiratory movement and no accessory muscle use. Cardiac: No murmur, rub or tachycardia. No upper or lower extremity edema.  GI: Abdomen is soft, no palpable hepatosplenomegaly. No fluid  wave. No tenderness. Musculoskeletal: No kyphosis, no tenderness over the spine, ribs or hips. Lymph: No cervical, infraclavicular, axillary or inguinal adenopathy. Neuro: No focal neurological deficits. Psych: Alert and oriented X 3, appropriate mood and affect.  Breasts: In the sitting position, bilateral redundant skin/tissue from the xyphoid process laterally to the underarm.    ECOG PERFORMANCE STATUS: 1 - Symptomatic but completely ambulatory  Blood pressure 105/70, pulse 76, temperature 97.8 F (36.6 C), height 5\' 3"  (1.6 m), weight 172 lb 6.4 oz (78.2 kg).  LABORATORY DATA: None drawn today.   ASSESSMENT: 61 year old female with: 1. History Stage II high-grade invasive ductal carcinoma, positive lymph node, post bilateral mastectomy followed by adjuvant chemotherapy following NSABP B- 49 clinical study. No evidence of disease.  2. Intolerant of Arimidex, with arthralgias.  3. Desire for weight loss in order to proceed with reconstruction (per Dr. Derryl Harbor).  4. Change in family history, sister recently diagnosed with breast cancer.   PLAN: 1. Discontinue Arimidex 1 mg daily. 2. Begin Aromasin 25 mg daily. 3. Referral to genetic counselor. 4. Referral to nutrition for weight loss counseling. 5. Return after July 5 per Research for appt with Dr. Welton Flakes and physical exam per protocol.   All questions were answered. The patient knows to call the clinic with any problems, questions or concerns.

## 2011-11-29 NOTE — Telephone Encounter (Signed)
per orders gave patient appointment for 12-13-2011 nut with barb neff at 11:15am genetics at 9:00am  printed out calendar and gave to the patient

## 2011-11-30 ENCOUNTER — Ambulatory Visit (INDEPENDENT_AMBULATORY_CARE_PROVIDER_SITE_OTHER): Payer: Medicare Other | Admitting: General Surgery

## 2011-12-11 ENCOUNTER — Ambulatory Visit (INDEPENDENT_AMBULATORY_CARE_PROVIDER_SITE_OTHER): Payer: Medicare Other | Admitting: General Surgery

## 2011-12-13 ENCOUNTER — Encounter: Payer: Self-pay | Admitting: Genetic Counselor

## 2011-12-13 ENCOUNTER — Other Ambulatory Visit: Payer: Medicare Other | Admitting: Lab

## 2011-12-13 ENCOUNTER — Ambulatory Visit: Payer: Medicare Other | Admitting: Nutrition

## 2011-12-13 ENCOUNTER — Ambulatory Visit (HOSPITAL_BASED_OUTPATIENT_CLINIC_OR_DEPARTMENT_OTHER): Payer: Medicare Other | Admitting: Genetic Counselor

## 2011-12-13 DIAGNOSIS — C773 Secondary and unspecified malignant neoplasm of axilla and upper limb lymph nodes: Secondary | ICD-10-CM

## 2011-12-13 DIAGNOSIS — C50919 Malignant neoplasm of unspecified site of unspecified female breast: Secondary | ICD-10-CM

## 2011-12-13 DIAGNOSIS — C801 Malignant (primary) neoplasm, unspecified: Secondary | ICD-10-CM

## 2011-12-13 NOTE — Progress Notes (Signed)
Out-patient Oncology Nutrition Note  [name] is a 61 year old female patient of Dr. Welton Flakes, diagnosed with breast cancer. She reported she is now breast cancer free.   Past Medical History  Diagnosis Date  . Fibromyalgia     muscle weakness and pain  . Depression   . Hypertension     benign  . Hyperlipidemia   . Rotator cuff rupture   . Arthritis     osteo  . Synovitis   . Rosacea   . Cataract   . Plantar fasciitis   . Ulcer   . Blindness of right eye at birth  . Osteoporosis   . Restless leg syndrome   . Anemia   . Edema 07/27/2011  . Cancer     Left breast   Height: 63" Weight: 172 lb.  BMI: 30.4 kg/ m^2  Patient Reports: Patient stated she would like to loose the weight she gained during treatment. She reported she eats a lot of fresh fruit and vegetables and feels she eats healthy. She attended the last [facility] class.   Nutrition Diagnosis: Nutrition related knowledge deficit r/t weight loss a/e/b limited prior knowledge/ exposure and desire to speak with an RD.   Intervention: I have discussed and provided handout for plant-based diet. We discussed healthy nutrition and exercise to promote weight loss. I have provided patient with several sample menus. She is without any further nutrition related questions at this time.   Goal:  1. Patient with understanding of nutrition information.  provided today.  2. Patient to keep food diary  For 2 days to become aware of caloric intake. 3. Patient to achieve weight loss goals to a healthy weight.  Monitor:  1. Provided patient RD contact information and instructed patient to contact for questions or concerns.   RD available for nutrition needs.  Iven Finn [facility] [phone]

## 2011-12-13 NOTE — Progress Notes (Signed)
Dr. Welton Flakes requested a consultation for genetic counseling and risk assessment for Krystal [name], a 61 y.o. female, for discussion of her personal history of breast cancer. She presents to clinic today to discuss the possibility of a genetic predisposition to cancer, and to further clarify her risks, as well as her family members' risks for cancer.   HISTORY OF PRESENT ILLNESS: In 1985, at the age of 46, Krystal [name] was diagnosed with cervical cancer.  In 2012, she was diagnosed with invasive ductal carcinoma of the left breast. This was treated with chemotherapy, surgery and radiation.    Past Medical History  Diagnosis Date  . Fibromyalgia     muscle weakness and pain  . Depression   . Hypertension     benign  . Hyperlipidemia   . Rotator cuff rupture   . Arthritis     osteo  . Synovitis   . Rosacea   . Cataract   . Plantar fasciitis   . Ulcer   . Blindness of right eye at birth  . Osteoporosis   . Restless leg syndrome   . Anemia   . Edema 07/27/2011  . Cancer     Left breast    Past Surgical History  Procedure Date  . Abdominal hysterectomy 1986    For bleeding and pain  . Eye surgery   . Bladder surgery 2006    Bladder tack  . Throat surgery   . Nose surgery 2007  . Carpal tunnel release   . Neuroma surgery   . Mastectomy 2012    bilateral    History  Substance Use Topics  . Smoking status: Never Smoker   . Smokeless tobacco: Never Used  . Alcohol Use: Yes     16 oz per day    REPRODUCTIVE HISTORY AND PERSONAL RISK ASSESSMENT FACTORS: Menarche was at age 32.   Post Menopausal Uterus Intact: No Ovaries Intact: Yes G4P2A2 , first live birth at age 77  She has not previously undergone treatment for infertility.   OCP use for 3-4 months   She has used HRT in the past.    FAMILY HISTORY:  We obtained a detailed, 4-generation family history.  Significant diagnoses are listed below: Family History  Problem Relation Age of Onset  . Cancer Mother  18    non hodgkins lymphoma  . Cancer Father     neck, throat, lung  . Lung cancer Father   . Throat cancer Father   . Hypertension Sister   . Cancer Sister 19    Breast cancer dx'd 04/2011  . Hypertension Brother   . Breast cancer Maternal Aunt   . Liver cancer Maternal Uncle   . Cancer Maternal Grandmother     died 36, unknown cancer  . Cancer Maternal Grandfather     died in his 58s; unknown cancer  . Lung cancer Maternal Uncle   . Throat cancer Maternal Uncle   . Breast cancer Maternal Aunt   . Cancer Cousin     died under the age 2, unknown cancer  . Cancer Cousin     died age 56; unknown cancer  The patient was diagnosed with cervical cancer at age 77 and breast cancer at age 56.  She has a maternal half sister with breast cancer at age 69.  The patients father died of neck, throat and lung cancer.  HE was a heavy smoker.  Her mother was diagnosed non-hodgkins lymphoma at age 57 and died at  97.  She has two maternal aunts who were diagnosed with breast cancer in their 24s and died in their 44s.  She has one maternal uncle who was diagnosed with liver cancer and was a heavy drinker, and another who had lung and throat cancer and was a heavy smoker.  She thinks that another uncle may have had pancreatic cancer.  She has two maternal cousins who died of unknown cancers, one at age 40 and another under the age of 51.  Her maternal grandparents both died of unknown cancers - her grandfather in his 70s and her grandmother at age 58.  Patient's maternal ancestors are of [place] and [place] descent, and paternal ancestors are of English descent. There is no reported Ashkenazi Jewish ancestry. There is no  known consanguinity.  GENETIC COUNSELING RISK ASSESSMENT, DISCUSSION, AND SUGGESTED FOLLOW UP: We reviewed the natural history and genetic etiology of sporadic, familial and hereditary cancer syndromes.  About 5-10% of breast cancer is hereditary.  Of this, about 85% is the result of a  BRCA1 or BRCA2 mutation.  We reviewed the red flags of hereditary cancer syndromes and the dominant inheritance patterns.  If the BRCA testing is negative, we discussed that we could be testing for the wrong gene.  We discussed gene panels, and that several cancer genes that are associated with different cancers can be tested at the same time.  Because of the different types of cancer that are in the patient's family, we will consider one of the panel tests if she is negative for BRCA mutations.  The patient's personal and family history is suggestive of the following possible diagnosis: hereditary cancer syndrome  We discussed that identification of a hereditary cancer syndrome may help her care providers tailor the patients medical management. If a mutation indicating hereditary breast cancer is detected in this case, the [employer] recommendations would include increased cancer surveillance and possible prophylactic surgery. If a mutation is detected, the patient will be referred back to the referring provider and to any additional appropriate care providers to discuss the relevant options.   If a mutation is not found in the patient, this will decrease the likelihood of hereditary cancer syndrome as the explanation for her breast cancer. Cancer surveillance options would be discussed for the patient according to the appropriate standard National Comprehensive Cancer Network and American Cancer Society guidelines, with consideration of their personal and family history risk factors. In this case, the patient will be referred back to their care providers for discussions of management.   In order to estimate her chance of having a BRCA1 or BRCA2 mutation, we used statistical models (Penn II) and laboratory data that take into account her personal medical history, family history and ancestry.  Because each model is different, there can be a lot of variability in the risks they  give.  Therefore, these numbers must be considered a rough range and not a precise risk of having a BRCA1 or BRCA2 mutation.  These models estimate that she has approximately a 6% chance of having a mutation.   After considering the risks, benefits, and limitations, the patient provided informed consent for  the following testing: BRCAnalysis through [facility] and CancerNext panel testing from [employer]   Per the patient's request, we will contact her by telephone to discuss these results. A follow up genetic counseling visit will be scheduled if indicated.  The patient was seen for a total of 60 minutes, greater than 50% of  which was spent face-to-face counseling.  This plan is being carried out per Dr. Milta Deiters recommendations.  This note will also be sent to the referring provider via the electronic medical record. The patient will be supplied with a summary of this genetic counseling discussion as well as educational information on the discussed hereditary cancer syndromes following the conclusion of their visit.   Patient was discussed with Dr. Drue Second.  EDUCATIONAL INFORMATION SUPPLIED TO PATIENT AT ENCOUNTER:  Hereditary breast and ovarian cancer syndrome brochure  _______________________________________________________________________ For Office Staff:  Number of people involved in session: 3 Was an Intern/ student involved with case: yes

## 2011-12-21 NOTE — Progress Notes (Signed)
Encounter addended by: Lowella Petties, RN on: 12/21/2011  1:49 PM<BR>     Documentation filed: Charges VN

## 2011-12-24 ENCOUNTER — Telehealth: Payer: Self-pay | Admitting: Genetic Counselor

## 2011-12-24 ENCOUNTER — Encounter: Payer: Self-pay | Admitting: Genetic Counselor

## 2011-12-24 NOTE — Telephone Encounter (Signed)
Revealed negative BRCA and BART results.  We will proceed with the CancerNext panel.

## 2012-02-08 ENCOUNTER — Encounter: Payer: Self-pay | Admitting: Oncology

## 2012-02-08 ENCOUNTER — Ambulatory Visit (HOSPITAL_BASED_OUTPATIENT_CLINIC_OR_DEPARTMENT_OTHER): Payer: Medicare Other | Admitting: Oncology

## 2012-02-08 ENCOUNTER — Telehealth: Payer: Self-pay | Admitting: *Deleted

## 2012-02-08 ENCOUNTER — Other Ambulatory Visit (HOSPITAL_BASED_OUTPATIENT_CLINIC_OR_DEPARTMENT_OTHER): Payer: Medicare Other | Admitting: Lab

## 2012-02-08 VITALS — BP 136/85 | HR 69 | Temp 98.3°F | Ht 63.0 in | Wt 178.8 lb

## 2012-02-08 DIAGNOSIS — E559 Vitamin D deficiency, unspecified: Secondary | ICD-10-CM

## 2012-02-08 DIAGNOSIS — C801 Malignant (primary) neoplasm, unspecified: Secondary | ICD-10-CM

## 2012-02-08 DIAGNOSIS — C50919 Malignant neoplasm of unspecified site of unspecified female breast: Secondary | ICD-10-CM

## 2012-02-08 DIAGNOSIS — C773 Secondary and unspecified malignant neoplasm of axilla and upper limb lymph nodes: Secondary | ICD-10-CM

## 2012-02-08 LAB — CBC WITH DIFFERENTIAL/PLATELET
BASO%: 1 % (ref 0.0–2.0)
EOS%: 6.3 % (ref 0.0–7.0)
MCH: 30.1 pg (ref 25.1–34.0)
MCHC: 34.4 g/dL (ref 31.5–36.0)
MCV: 87.5 fL (ref 79.5–101.0)
MONO%: 9.2 % (ref 0.0–14.0)
RDW: 15.2 % — ABNORMAL HIGH (ref 11.2–14.5)
lymph#: 1.4 10*3/uL (ref 0.9–3.3)

## 2012-02-08 MED ORDER — ANASTROZOLE 1 MG PO TABS: 1.0000 mg | ORAL_TABLET | Freq: Every day | ORAL | Status: AC

## 2012-02-08 NOTE — Telephone Encounter (Signed)
left voice message informing the patient of the new date and time on 09-05-2012 starting at 9:00am

## 2012-02-08 NOTE — Progress Notes (Signed)
OFFICE PROGRESS NOTE  CC  [name],[name], MD [address]   [place] [address]  DIAGNOSIS: 61 year old female with stage II invasive ductal carcinoma of the left breast status post mastectomy of the left breast with prophylactic right mastectomy.  PRIOR THERAPY:  #1 patient is status post bilateral mastectomies. She underwent a therapeutic left mastectomy for a stage II high-grade invasive ductal carcinoma that was ER positive PR positive HER-2/neu negative. Patient also had a prophylactic right mastectomy.  #2 patient is now status post adjuvant chemotherapy following NSABP B. 49 clinical trial. She received 4 cycles of dose dense Adriamycin and Cytoxan followed by weekly single agent Taxol completed December 2012.  #3 patient is on adjuvant antiestrogen therapy consisting of Arimidex 1 mg daily. A total of 5 years therapy is planned.  CURRENT THERAPY: Arimidex 1 mg daily  INTERVAL HISTORY: Krystal [name] 61 y.o. female returns for followup visit today. Overall patient is doing well. She is babysitting a 29-month-old grandson. She does get tired at the end of the day. She does have some joint stiffness. She otherwise denies any fevers chills night sweats headaches no shortness of breath no chest pains no palpitations she does have history of fibromyalgia she is on Lyrica for this and this is controlling her pains. She has no bleeding problems no vaginal discharge. Remainder of the 10 point review of systems is negative. MEDICAL HISTORY: Past Medical History  Diagnosis Date  . Fibromyalgia     muscle weakness and pain  . Depression   . Hypertension     benign  . Hyperlipidemia   . Rotator cuff rupture   . Arthritis     osteo  . Synovitis   . Rosacea   . Cataract   . Plantar fasciitis   . Ulcer   . Blindness of right eye at birth  . Osteoporosis   . Restless leg syndrome   . Anemia   . Edema 07/27/2011  . Cancer     Left breast    ALLERGIES:   has no known  allergies.  MEDICATIONS:  Current Outpatient Prescriptions  Medication Sig Dispense Refill  . alendronate (FOSAMAX) 70 MG tablet Take 70 mg by mouth every 7 (seven) days. Take with a full glass of water on an empty stomach.       Marland Kitchen amLODipine (NORVASC) 10 MG tablet Take 10 mg by mouth daily.        Marland Kitchen atenolol (TENORMIN) 100 MG tablet Take 100 mg by mouth daily.        . furosemide (LASIX) 20 MG tablet Take 40 mg by mouth 2 (two) times daily.       Marland Kitchen lisinopril (PRINIVIL,ZESTRIL) 20 MG tablet Take 20 mg by mouth daily.        . potassium chloride SA (K-DUR,KLOR-CON) 20 MEQ tablet Take 20 mEq by mouth 2 (two) times daily.      . pregabalin (LYRICA) 300 MG capsule Take 300 mg by mouth 2 (two) times daily.        . ranitidine (ZANTAC) 150 MG tablet Take 150 mg by mouth 2 (two) times daily.        Marland Kitchen zolpidem (AMBIEN CR) 12.5 MG CR tablet Take 10 mg by mouth at bedtime as needed. Pt takes 10mg  tablet at bedtime         SURGICAL HISTORY:  Past Surgical History  Procedure Date  . Abdominal hysterectomy 1986    For bleeding and pain  . Eye surgery   .  Bladder surgery 2006    Bladder tack  . Throat surgery   . Nose surgery 2007  . Carpal tunnel release   . Neuroma surgery   . Mastectomy 2012    bilateral    REVIEW OF SYSTEMS:  Pertinent items are noted in HPI.   PHYSICAL EXAMINATION: General appearance: alert, cooperative and appears stated age Head: Normocephalic, without obvious abnormality, atraumatic Neck: no adenopathy, no carotid bruit, no JVD, supple, symmetrical, trachea midline and thyroid not enlarged, symmetric, no tenderness/mass/nodules Lymph nodes: Cervical, supraclavicular, and axillary nodes normal. Resp: clear to auscultation bilaterally and normal percussion bilaterally Back: symmetric, no curvature. ROM normal. No CVA tenderness. Cardio: regular rate and rhythm, S1, S2 normal, no murmur, click, rub or gallop GI: soft, non-tender; bowel sounds normal; no masses,  no  organomegaly Extremities: edema Bilaterally.. It is nonpitting Neurologic: Alert and oriented X 3, normal strength and tone. Normal symmetric reflexes. Normal coordination and gait  ECOG PERFORMANCE STATUS: 1 - Symptomatic but completely ambulatory  Blood pressure 136/85, pulse 69, temperature 98.3 F (36.8 C), temperature source Oral, height 5\' 3"  (1.6 m), weight 178 lb 12.8 oz (81.103 kg).  LABORATORY DATA: Lab Results  Component Value Date   WBC 11.4* 07/27/2011   HGB 11.9 07/27/2011   HCT 35.1 07/27/2011   MCV 94.2 07/27/2011   PLT 358 07/27/2011      Chemistry      Component Value Date/Time   NA 138 11/29/2011 1020   K 3.7 11/29/2011 1020   CL 98 11/29/2011 1020   CO2 28 11/29/2011 1020   BUN 24* 11/29/2011 1020   CREATININE 0.96 11/29/2011 1020      Component Value Date/Time   CALCIUM 10.0 11/29/2011 1020   ALKPHOS 98 11/29/2011 1020   AST 20 11/29/2011 1020   ALT 19 11/29/2011 1020   BILITOT 0.7 11/29/2011 1020       RADIOGRAPHIC STUDIES:  No results found.  ASSESSMENT: 61 year old female with:  #1 stage II high-grade invasive ductal carcinoma with a positive lymph node patient is status post bilateral mastectomy followed by adjuvant chemotherapy following NSABP B. 49 clinical study. Patient is now on adjuvant Arimidex 1 mg daily. Overall she is tolerating well. Patient has no evidence of recurrent disease.  #2 joint stiffness could be due to arthritis versus Arimidex. I have recommended that patient take some anti-inflammatories such as Aleve to see if the stiffness improves.    PLAN: #1 patient will continue Arimidex 1 mg daily.A new prescription was sent electronically to her pharmacy of choice today.  #2 I will see her back in 6 months time.  All questions were answered. The patient knows to call the clinic with any problems, questions or concerns. We can certainly see the patient much sooner if necessary.  I spent 25 minutes counseling the patient face to face. The total time  spent in the appointment was 30 minutes.    Drue Second, MD Medical/Oncology [facility] [phone] (beeper) [phone] (Office)  02/08/2012, 9:04 AM

## 2012-02-08 NOTE — Patient Instructions (Addendum)
1. Doing well  2. Follow up in 6 months

## 2012-02-09 LAB — COMPREHENSIVE METABOLIC PANEL
ALT: 16 U/L (ref 0–35)
AST: 19 U/L (ref 0–37)
Albumin: 4.5 g/dL (ref 3.5–5.2)
Alkaline Phosphatase: 95 U/L (ref 39–117)
Calcium: 9.7 mg/dL (ref 8.4–10.5)
Chloride: 98 mEq/L (ref 96–112)
Creatinine, Ser: 0.9 mg/dL (ref 0.50–1.10)
Potassium: 3.8 mEq/L (ref 3.5–5.3)

## 2012-02-11 ENCOUNTER — Telehealth: Payer: Self-pay | Admitting: Medical Oncology

## 2012-02-11 ENCOUNTER — Other Ambulatory Visit: Payer: Self-pay | Admitting: Medical Oncology

## 2012-02-11 MED ORDER — EXEMESTANE 25 MG PO TABS: 25.0000 mg | ORAL_TABLET | Freq: Every day | ORAL | Status: AC

## 2012-02-11 NOTE — Telephone Encounter (Signed)
Per MD, patient to begin taking Vitamin D3 2000 units daily.  Patient confirmed medication, dosage, and frequency.  No questions at this time.

## 2012-02-11 NOTE — Telephone Encounter (Signed)
Error

## 2012-02-11 NOTE — Telephone Encounter (Signed)
Message copied by Tylene Fantasia on Mon Feb 11, 2012 11:53 AM ------      Message from: Victorino December      Created: Mon Feb 11, 2012  9:39 AM       Please call patient: take vitamin D3 2000iu daily

## 2012-03-11 ENCOUNTER — Telehealth: Payer: Self-pay | Admitting: Genetic Counselor

## 2012-03-11 NOTE — Telephone Encounter (Signed)
Left message that we had test results back to go over.

## 2012-03-18 ENCOUNTER — Encounter: Payer: Self-pay | Admitting: Genetic Counselor

## 2012-03-18 ENCOUNTER — Telehealth: Payer: Self-pay | Admitting: Medical Oncology

## 2012-03-18 NOTE — Telephone Encounter (Signed)
Patient Krystal [name] " I am having this tightness in my hands and knees to the point I am crackling when I walk.  The Arimidex that Dr. Welton Flakes has put me on is just making my arthritis unbearable.  I have stopped taking the medicine as of today and would like to know if Dr. Welton Flakes wants me to do something different."  Will review with MD

## 2012-03-19 ENCOUNTER — Encounter: Payer: Self-pay | Admitting: Medical Oncology

## 2012-03-19 ENCOUNTER — Telehealth: Payer: Self-pay | Admitting: Medical Oncology

## 2012-03-19 NOTE — Telephone Encounter (Signed)
Per MD, patient to hold Arimidex until she sees Dr. Welton Flakes.  Patient to receive call from scheduling with appointment date/time.  No further questions at this time, instructed patient to call with any questions or concerns.  Oncology treatment plan sent to scheduling

## 2012-03-19 NOTE — Telephone Encounter (Signed)
Continue to hold arimidex and see me as scheduled

## 2012-03-20 ENCOUNTER — Telehealth: Payer: Self-pay | Admitting: Oncology

## 2012-03-20 NOTE — Telephone Encounter (Signed)
S/w the pt and she is aware of her sept appt. °

## 2012-04-17 ENCOUNTER — Encounter: Payer: Self-pay | Admitting: Oncology

## 2012-04-17 ENCOUNTER — Ambulatory Visit (HOSPITAL_BASED_OUTPATIENT_CLINIC_OR_DEPARTMENT_OTHER): Payer: Medicare Other | Admitting: Oncology

## 2012-04-17 ENCOUNTER — Telehealth: Payer: Self-pay | Admitting: *Deleted

## 2012-04-17 VITALS — BP 114/71 | HR 83 | Temp 98.7°F | Resp 20 | Ht 63.0 in | Wt 180.7 lb

## 2012-04-17 DIAGNOSIS — C50919 Malignant neoplasm of unspecified site of unspecified female breast: Secondary | ICD-10-CM

## 2012-04-17 DIAGNOSIS — Z170 Estrogen receptor positive status [ER+]: Secondary | ICD-10-CM

## 2012-04-17 DIAGNOSIS — C773 Secondary and unspecified malignant neoplasm of axilla and upper limb lymph nodes: Secondary | ICD-10-CM

## 2012-04-17 MED ORDER — EXEMESTANE 25 MG PO TABS: 25.0000 mg | ORAL_TABLET | Freq: Every day | ORAL | Status: DC

## 2012-04-17 NOTE — Progress Notes (Signed)
OFFICE PROGRESS NOTE  CC  [name],[name], MD [address]   [place] [address]  DIAGNOSIS: 61 year old female with stage II invasive ductal carcinoma of the left breast status post mastectomy of the left breast with prophylactic right mastectomy.  PRIOR THERAPY:  #1 patient is status post bilateral mastectomies. She underwent a therapeutic left mastectomy for a stage II high-grade invasive ductal carcinoma that was ER positive PR positive HER-2/neu negative. Patient also had a prophylactic right mastectomy.  #2 patient is now status post adjuvant chemotherapy following NSABP B. 49 clinical trial. She received 4 cycles of dose dense Adriamycin and Cytoxan followed by weekly single agent Taxol completed December 2012.  #3 patient is on adjuvant antiestrogen therapy consisting of Arimidex 1 mg daily. A total of 5 years therapy is planned. Aromasin and Arimidex have been discontinued as patient is not able tolerate these.  CURRENT THERAPY: Tamoxifen 20 mg daily  INTERVAL HISTORY: Krystal [name] 61 y.o. female returns for followup visit today. Patient's primary she weighs myalgias and arthralgias and does not having enough energy to do anything. She is taking a lot of pain medications for her arthralgias. She feels that these are worse when she is on antiestrogen therapy with the aromatase inhibitors. She feels tired fatigued otherwise no nausea vomiting fevers chills night sweats chest pains or palpitations. Remainder of the 10 point review of systems is negative.   MEDICAL HISTORY: Past Medical History  Diagnosis Date  . Fibromyalgia     muscle weakness and pain  . Depression   . Hypertension     benign  . Hyperlipidemia   . Rotator cuff rupture   . Arthritis     osteo  . Synovitis   . Rosacea   . Cataract   . Plantar fasciitis   . Ulcer   . Blindness of right eye at birth  . Osteoporosis   . Restless leg syndrome   . Anemia   . Edema 07/27/2011  . Cancer     Left  breast    ALLERGIES:   has no known allergies.  MEDICATIONS:  Current Outpatient Prescriptions  Medication Sig Dispense Refill  . alendronate (FOSAMAX) 70 MG tablet Take 70 mg by mouth every 7 (seven) days. Take with a full glass of water on an empty stomach.       Marland Kitchen amLODipine (NORVASC) 10 MG tablet Take 10 mg by mouth daily.        Marland Kitchen atenolol (TENORMIN) 100 MG tablet Take 100 mg by mouth daily.        . furosemide (LASIX) 20 MG tablet Take 40 mg by mouth 2 (two) times daily.       Marland Kitchen lisinopril (PRINIVIL,ZESTRIL) 20 MG tablet Take 20 mg by mouth daily.        . potassium chloride SA (K-DUR,KLOR-CON) 20 MEQ tablet Take 20 mEq by mouth 2 (two) times daily.      . pregabalin (LYRICA) 300 MG capsule Take 300 mg by mouth 2 (two) times daily.        . ranitidine (ZANTAC) 150 MG tablet Take 150 mg by mouth 2 (two) times daily.        Marland Kitchen zolpidem (AMBIEN CR) 12.5 MG CR tablet Take 10 mg by mouth at bedtime as needed. Pt takes 10mg  tablet at bedtime         SURGICAL HISTORY:  Past Surgical History  Procedure Date  . Abdominal hysterectomy 1986    For bleeding and pain  .  Eye surgery   . Bladder surgery 2006    Bladder tack  . Throat surgery   . Nose surgery 2007  . Carpal tunnel release   . Neuroma surgery   . Mastectomy 2012    bilateral    REVIEW OF SYSTEMS:  Pertinent items are noted in HPI.   PHYSICAL EXAMINATION: General appearance: alert, cooperative and appears stated age Head: Normocephalic, without obvious abnormality, atraumatic Neck: no adenopathy, no carotid bruit, no JVD, supple, symmetrical, trachea midline and thyroid not enlarged, symmetric, no tenderness/mass/nodules Lymph nodes: Cervical, supraclavicular, and axillary nodes normal. Resp: clear to auscultation bilaterally and normal percussion bilaterally Back: symmetric, no curvature. ROM normal. No CVA tenderness. Cardio: regular rate and rhythm, S1, S2 normal, no murmur, click, rub or gallop GI: soft,  non-tender; bowel sounds normal; no masses,  no organomegaly Extremities: edema Bilaterally.. It is nonpitting Neurologic: Alert and oriented X 3, normal strength and tone. Normal symmetric reflexes. Normal coordination and gait  ECOG PERFORMANCE STATUS: 1 - Symptomatic but completely ambulatory  Blood pressure 114/71, pulse 83, temperature 98.7 F (37.1 C), temperature source Oral, resp. rate 20, height 5\' 3"  (1.6 m), weight 180 lb 11.2 oz (81.965 kg).  LABORATORY DATA: Lab Results  Component Value Date   WBC 4.9 02/08/2012   HGB 14.3 02/08/2012   HCT 41.7 02/08/2012   MCV 87.5 02/08/2012   PLT 279 02/08/2012      Chemistry      Component Value Date/Time   NA 139 02/08/2012 1003   K 3.8 02/08/2012 1003   CL 98 02/08/2012 1003   CO2 29 02/08/2012 1003   BUN 18 02/08/2012 1003   CREATININE 0.90 02/08/2012 1003      Component Value Date/Time   CALCIUM 9.7 02/08/2012 1003   ALKPHOS 95 02/08/2012 1003   AST 19 02/08/2012 1003   ALT 16 02/08/2012 1003   BILITOT 0.6 02/08/2012 1003       RADIOGRAPHIC STUDIES:  No results found.  ASSESSMENT: 61 year old female with:  #1 stage II high-grade invasive ductal carcinoma with a positive lymph node patient is status post bilateral mastectomy followed by adjuvant chemotherapy following NSABP B. 49 clinical study. Patient is now on adjuvant Arimidex 1 mg daily.  Patient has no evidence of recurrent disease. However patient was not able to tolerate this well and therefore it is discontinued. Patient was recommended to Aromasin but we have used this in the past. Therefore we will try tamoxifen 20 mg daily hopefully she can tolerate this.  PLAN: #1 proceed with tamoxifen 20 mg daily risks and benefits of this were discussed with the patient.  #2 she will call with any problems. In the meantime I have set her up to be seen by me in about 2-3 months time.  All questions were answered. The patient knows to call the clinic with any problems, questions  or concerns. We can certainly see the patient much sooner if necessary.  I spent 25 minutes counseling the patient face to face. The total time spent in the appointment was 30 minutes.    Drue Second, MD Medical/Oncology [facility] [phone] (beeper) [phone] (Office)  04/17/2012, 3:56 PM

## 2012-04-17 NOTE — Patient Instructions (Addendum)
Proceed with aromasin 25 mg daily  Call me if you start having problems with the aromasin  I will see you back in 1 month  Exemestane tablets What is this medicine? EXEMESTANE (ex e MES tane) blocks the production of the hormone estrogen. Some types of breast cancer depend on estrogen to grow, and this medicine can stop tumor growth by blocking estrogen production. This medicine is for the treatment of breast cancer in postmenopausal women only. This medicine may be used for other purposes; ask your health care provider or pharmacist if you have questions. What should I tell my health care provider before I take this medicine? They need to know if you have any of these conditions: -an unusual or allergic reaction to exemestane, other medicines, foods, dyes, or preservatives -pregnant or trying to get pregnant -breast-feeding How should I use this medicine? Take this medicine by mouth with a glass of water. Follow the directions on the prescription label. Take your doses at regular intervals after a meal. Do not take your medicine more often than directed. Do not stop taking except on the advice of your doctor or health care professional. Contact your pediatrician regarding the use of this medicine in children. Special care may be needed. Overdosage: If you think you have taken too much of this medicine contact a poison control center or emergency room at once. NOTE: This medicine is only for you. Do not share this medicine with others. What if I miss a dose? If you miss a dose, take the next dose as usual. Do not try to make up the missed dose. Do not take double or extra doses. What may interact with this medicine? Do not take this medicine with any of the following medications: -female hormones, like estrogens and birth control pills This medicine may also interact with the following medications: -androstenedione -phenytoin -rifabutin, rifampin, or rifapentine -St. John's Wort This  list may not describe all possible interactions. Give your health care provider a list of all the medicines, herbs, non-prescription drugs, or dietary supplements you use. Also tell them if you smoke, drink alcohol, or use illegal drugs. Some items may interact with your medicine. What should I watch for while using this medicine? Visit your doctor or health care professional for regular checks on your progress. If you experience hot flashes or sweating while taking this medicine, avoid alcohol, smoking and drinks with caffeine. This may help to decrease these side effects. What side effects may I notice from receiving this medicine? Side effects that you should report to your doctor or health care professional as soon as possible: -any new or unusual symptoms -changes in vision -fever -leg or arm swelling -pain in bones, joints, or muscles -pain in hips, back, ribs, arms, shoulders, or legs Side effects that usually do not require medical attention (report to your doctor or health care professional if they continue or are bothersome): -difficulty sleeping -headache -hot flashes -sweating -unusually weak or tired This list may not describe all possible side effects. Call your doctor for medical advice about side effects. You may report side effects to FDA at 1-800-FDA-1088. Where should I keep my medicine? Keep out of the reach of children. Store at room temperature between 15 and 30 degrees C (59 and 86 degrees F). Throw away any unused medicine after the expiration date. NOTE: This sheet is a summary. It may not cover all possible information. If you have questions about this medicine, talk to your doctor, pharmacist, or health  care provider.  2012, [employer]vier/[employer] Standard. (11/11/2007 11:48:29 AM)

## 2012-04-17 NOTE — Telephone Encounter (Signed)
09-05-2012 

## 2012-04-18 ENCOUNTER — Other Ambulatory Visit: Payer: Self-pay | Admitting: Medical Oncology

## 2012-04-18 ENCOUNTER — Telehealth: Payer: Self-pay | Admitting: Medical Oncology

## 2012-04-18 MED ORDER — EXEMESTANE 25 MG PO TABS: 25.0000 mg | ORAL_TABLET | Freq: Every day | ORAL | Status: DC

## 2012-04-18 NOTE — Telephone Encounter (Signed)
Patient called stating that prescription had not been sent to [facility] yesterday.  Prescription sent to [facility] per Dr. Milta Deiters original order.  Notified patient that prescription had been sent to [facility] yesterday and that I had sent a new prescription to [facility] per her request.  Patient states that she thought MD had sent it to [facility].  Advised patient that I would cancel prescription with [facility] so that she could pick prescription up today from [facility], however patient requested that [facility] be canceled and she will receive medication from [facility].    Contacted [facility] today and canceled prescription.

## 2012-04-21 ENCOUNTER — Other Ambulatory Visit: Payer: Self-pay | Admitting: *Deleted

## 2012-04-21 MED ORDER — TAMOXIFEN CITRATE 20 MG PO TABS: 20.0000 mg | ORAL_TABLET | Freq: Every day | ORAL | Status: DC

## 2012-04-21 NOTE — Telephone Encounter (Signed)
Pt called [name] states" Aromasin is what gave me arthritic problems before, What is it that I should be taking?" Reviewed with MD, VO- pt to start Tamoxifen 20mg  Daily. [name] for pt to call back, new medication to be called in after pt's pharmacy is confirmed.

## 2012-04-21 NOTE — Telephone Encounter (Signed)
Pt called back confirmed her Rx should go to [facility] on [address]. Rx e-scribed.

## 2012-04-22 ENCOUNTER — Other Ambulatory Visit: Payer: Self-pay | Admitting: *Deleted

## 2012-04-22 MED ORDER — TAMOXIFEN CITRATE 20 MG PO TABS: 20.0000 mg | ORAL_TABLET | Freq: Every day | ORAL | Status: DC

## 2012-05-15 ENCOUNTER — Ambulatory Visit (HOSPITAL_BASED_OUTPATIENT_CLINIC_OR_DEPARTMENT_OTHER): Payer: Medicare Other | Admitting: Adult Health

## 2012-05-15 ENCOUNTER — Telehealth: Payer: Self-pay | Admitting: Oncology

## 2012-05-15 ENCOUNTER — Encounter: Payer: Self-pay | Admitting: Adult Health

## 2012-05-15 ENCOUNTER — Other Ambulatory Visit (HOSPITAL_BASED_OUTPATIENT_CLINIC_OR_DEPARTMENT_OTHER): Payer: Medicare Other | Admitting: Lab

## 2012-05-15 VITALS — BP 131/87 | HR 56 | Temp 98.6°F | Resp 20 | Ht 63.0 in | Wt 177.5 lb

## 2012-05-15 DIAGNOSIS — C773 Secondary and unspecified malignant neoplasm of axilla and upper limb lymph nodes: Secondary | ICD-10-CM

## 2012-05-15 DIAGNOSIS — C50219 Malignant neoplasm of upper-inner quadrant of unspecified female breast: Secondary | ICD-10-CM

## 2012-05-15 DIAGNOSIS — C50919 Malignant neoplasm of unspecified site of unspecified female breast: Secondary | ICD-10-CM

## 2012-05-15 LAB — COMPREHENSIVE METABOLIC PANEL (CC13)
Albumin: 3.9 g/dL (ref 3.5–5.0)
Alkaline Phosphatase: 106 U/L (ref 40–150)
BUN: 14 mg/dL (ref 7.0–26.0)
CO2: 26 mEq/L (ref 22–29)
Calcium: 9.6 mg/dL (ref 8.4–10.4)
Chloride: 99 mEq/L (ref 98–107)
Glucose: 144 mg/dl — ABNORMAL HIGH (ref 70–99)
Potassium: 3.5 mEq/L (ref 3.5–5.1)
Sodium: 136 mEq/L (ref 136–145)
Total Protein: 7.1 g/dL (ref 6.4–8.3)

## 2012-05-15 LAB — CBC WITH DIFFERENTIAL/PLATELET
Basophils Absolute: 0.1 10*3/uL (ref 0.0–0.1)
Eosinophils Absolute: 0.6 10*3/uL — ABNORMAL HIGH (ref 0.0–0.5)
HGB: 14.7 g/dL (ref 11.6–15.9)
MCV: 91.4 fL (ref 79.5–101.0)
MONO#: 0.5 10*3/uL (ref 0.1–0.9)
MONO%: 9.1 % (ref 0.0–14.0)
NEUT#: 2.7 10*3/uL (ref 1.5–6.5)
RDW: 14.6 % — ABNORMAL HIGH (ref 11.2–14.5)
WBC: 5.6 10*3/uL (ref 3.9–10.3)

## 2012-05-15 NOTE — Patient Instructions (Addendum)
Doing well.  No sign of recurrence.  Continue Tamoxifen.  We will see you back in January 2014.  Please call us if you have any questions or concerns.

## 2012-05-15 NOTE — Progress Notes (Signed)
OFFICE PROGRESS NOTE  CC  [name],[name], MD [address]   [place] [address]  DIAGNOSIS: 61 year old female with stage II invasive ductal carcinoma of the left breast status post mastectomy of the left breast with prophylactic right mastectomy.  PRIOR THERAPY:  #1 patient is status post bilateral mastectomies. She underwent a therapeutic left mastectomy for a stage II high-grade invasive ductal carcinoma that was ER positive PR positive HER-2/neu negative. Patient also had a prophylactic right mastectomy.  #2 patient is now status post adjuvant chemotherapy following NSABP B. 49 clinical trial. She received 4 cycles of dose dense Adriamycin and Cytoxan followed by weekly single agent Taxol completed December 2012.  #3 patient is on adjuvant antiestrogen therapy consisting of Arimidex 1 mg daily. A total of 5 years therapy is planned. Aromasin and Arimidex have been discontinued as patient is not able tolerate these.  CURRENT THERAPY: Tamoxifen 20 mg daily  INTERVAL HISTORY: KAMOR GABOR 61 y.o. female returns for followup visit today.  She is doing well.  She endorses right hand stiffness that is tolerable, and better than when she was on Arimidex.  She does have some hot flashes, otherwise she is doing well.     MEDICAL HISTORY: Past Medical History  Diagnosis Date  . Fibromyalgia     muscle weakness and pain  . Depression   . Hypertension     benign  . Hyperlipidemia   . Rotator cuff rupture   . Arthritis     osteo  . Synovitis   . Rosacea   . Cataract   . Plantar fasciitis   . Ulcer   . Blindness of right eye at birth  . Osteoporosis   . Restless leg syndrome   . Anemia   . Edema 07/27/2011  . Cancer     Left breast    ALLERGIES:   has no known allergies.  MEDICATIONS:  Current Outpatient Prescriptions  Medication Sig Dispense Refill  . alendronate (FOSAMAX) 70 MG tablet Take 70 mg by mouth every 7 (seven) days. Take with a full glass of water on an  empty stomach.       Marland Kitchen amLODipine (NORVASC) 10 MG tablet Take 10 mg by mouth daily.        Marland Kitchen atenolol (TENORMIN) 100 MG tablet Take 100 mg by mouth daily.        . furosemide (LASIX) 20 MG tablet Take 40 mg by mouth 2 (two) times daily.       Marland Kitchen lisinopril (PRINIVIL,ZESTRIL) 20 MG tablet Take 20 mg by mouth daily.        . potassium chloride SA (K-DUR,KLOR-CON) 20 MEQ tablet Take 20 mEq by mouth 2 (two) times daily.      . pregabalin (LYRICA) 300 MG capsule Take 300 mg by mouth 2 (two) times daily.        . ranitidine (ZANTAC) 150 MG tablet Take 150 mg by mouth 2 (two) times daily.        . tamoxifen (NOLVADEX) 20 MG tablet Take 1 tablet (20 mg total) by mouth daily.  90 tablet  4  . zolpidem (AMBIEN CR) 12.5 MG CR tablet Take 10 mg by mouth at bedtime as needed. Pt takes 10mg  tablet at bedtime         SURGICAL HISTORY:  Past Surgical History  Procedure Date  . Abdominal hysterectomy 1986    For bleeding and pain  . Eye surgery   . Bladder surgery 2006  Bladder tack  . Throat surgery   . Nose surgery 2007  . Carpal tunnel release   . Neuroma surgery   . Mastectomy 2012    bilateral   Health Maintenance  Mammogram: 11/2010 Colonoscopy: 2010 Bone Density Scan: 2010 Pap Smear: 2011 Eye Exam: 2011 Vitamin D Level: with next draw Lipid Panel: 2012  REVIEW OF SYSTEMS:   General: fatigue (-), night sweats (-), fever (-), pain (-) Lymph: palpable nodes (-) HEENT: vision changes (-), mucositis (-), gum bleeding (-), epistaxis (-) Cardiovascular: chest pain (-), palpitations (-) Pulmonary: shortness of breath (-), dyspnea on exertion (-), cough (-), hemoptysis (-) GI:  Early satiety (-), melena (-), dysphagia (-), nausea/vomiting (-), diarrhea (-) GU: dysuria (-), hematuria (-), incontinence (-) Musculoskeletal: joint swelling (-), joint pain (+), back pain (-) Neuro: weakness (-), numbness (-), headache (-), confusion (-) Skin: Rash (-), lesions (-), dryness (-) Psych:  depression (-), suicidal/homicidal ideation (-), feeling of hopelessness (-)   PHYSICAL EXAMINATION:  BP 131/87  Pulse 56  Temp 98.6 F (37 C) (Oral)  Resp 20  Ht 5\' 3"  (1.6 m)  Wt 177 lb 8 oz (80.513 kg)  BMI 31.44 kg/m2 General: Patient is a well appearing female in no acute distress HEENT: PERRLA, sclerae anicteric no conjunctival pallor, MMM Neck: supple, no palpable adenopathy Lungs: clear to auscultation bilaterally, no wheezes, rhonchi, or rales Cardiovascular: regular rate rhythm, S1, S2, no murmurs, rubs or gallops Abdomen: Soft, non-tender, non-distended, normoactive bowel sounds, no HSM Extremities: warm and well perfused, no clubbing, cyanosis, or edema Skin: No rashes or lesions Neuro: Non-focal ECOG PERFORMANCE STATUS: 1 - Symptomatic but completely ambulatory Breasts: bilateral mastectomy scars normal, no nodularity, or sign of recurrence.     LABORATORY DATA: Lab Results  Component Value Date   WBC 5.6 05/15/2012   HGB 14.7 05/15/2012   HCT 43.2 05/15/2012   MCV 91.4 05/15/2012   PLT 285 05/15/2012      Chemistry      Component Value Date/Time   NA 139 02/08/2012 1003   K 3.8 02/08/2012 1003   CL 98 02/08/2012 1003   CO2 29 02/08/2012 1003   BUN 18 02/08/2012 1003   CREATININE 0.90 02/08/2012 1003      Component Value Date/Time   CALCIUM 9.7 02/08/2012 1003   ALKPHOS 95 02/08/2012 1003   AST 19 02/08/2012 1003   ALT 16 02/08/2012 1003   BILITOT 0.6 02/08/2012 1003       RADIOGRAPHIC STUDIES:  No results found.  ASSESSMENT: 61 year old female with:  #1 stage II high-grade invasive ductal carcinoma with a positive lymph node patient is status post bilateral mastectomy followed by adjuvant chemotherapy following NSABP B. 49 clinical study. Patient is now on adjuvant Arimidex 1 mg daily.  Patient has no evidence of recurrent disease. However patient was not able to tolerate this well and therefore it is discontinued. Patient was recommended to Aromasin  but we have used this in the past. Therefore we will try tamoxifen 20 mg daily hopefully she can tolerate this.  PLAN:  #1 Ms. Ebanks is doing well today.  She will continue on the Tamoxifen.  No sign of recurrence.  I did recommend that she have a cholesterol panel checked annually while taking the Tamoxifen.    #2 she will call with any problems. In the meantime I have set her up to be seen in January.  All questions were answered. The patient knows to call the clinic with any problems,  questions or concerns. We can certainly see the patient much sooner if necessary.  I spent 25 minutes counseling the patient face to face. The total time spent in the appointment was 30 minutes.   Cherie Ouch Lyn Hollingshead, NP Medical Oncology [facility] Phone: [phone]  05/15/2012, 2:30 PM

## 2012-05-15 NOTE — Telephone Encounter (Signed)
gve the pt her jan 2014 appt calendar °

## 2012-07-04 ENCOUNTER — Telehealth (INDEPENDENT_AMBULATORY_CARE_PROVIDER_SITE_OTHER): Payer: Self-pay

## 2012-07-04 NOTE — Telephone Encounter (Signed)
Referral made to Dr. Wayland Denis for breast reconstruction.  Pt is aware.

## 2012-08-04 ENCOUNTER — Encounter: Payer: Self-pay | Admitting: *Deleted

## 2012-08-04 ENCOUNTER — Encounter: Payer: Self-pay | Admitting: Oncology

## 2012-08-04 ENCOUNTER — Telehealth: Payer: Self-pay | Admitting: *Deleted

## 2012-08-04 ENCOUNTER — Other Ambulatory Visit (HOSPITAL_BASED_OUTPATIENT_CLINIC_OR_DEPARTMENT_OTHER): Payer: Medicare HMO | Admitting: Lab

## 2012-08-04 ENCOUNTER — Ambulatory Visit (HOSPITAL_BASED_OUTPATIENT_CLINIC_OR_DEPARTMENT_OTHER): Payer: Medicare HMO | Admitting: Oncology

## 2012-08-04 VITALS — BP 121/70 | HR 88 | Temp 97.9°F | Resp 20 | Ht 63.0 in | Wt 186.1 lb

## 2012-08-04 DIAGNOSIS — Z170 Estrogen receptor positive status [ER+]: Secondary | ICD-10-CM

## 2012-08-04 DIAGNOSIS — C773 Secondary and unspecified malignant neoplasm of axilla and upper limb lymph nodes: Secondary | ICD-10-CM

## 2012-08-04 DIAGNOSIS — C50919 Malignant neoplasm of unspecified site of unspecified female breast: Secondary | ICD-10-CM

## 2012-08-04 LAB — CBC WITH DIFFERENTIAL/PLATELET
BASO%: 1.4 % (ref 0.0–2.0)
EOS%: 15.4 % — ABNORMAL HIGH (ref 0.0–7.0)
LYMPH%: 20.3 % (ref 14.0–49.7)
MCHC: 34.9 g/dL (ref 31.5–36.0)
MCV: 88.9 fL (ref 79.5–101.0)
MONO#: 0.7 10*3/uL (ref 0.1–0.9)
MONO%: 12 % (ref 0.0–14.0)
Platelets: 246 10*3/uL (ref 145–400)
RBC: 4.67 10*6/uL (ref 3.70–5.45)
WBC: 6.1 10*3/uL (ref 3.9–10.3)

## 2012-08-04 LAB — COMPREHENSIVE METABOLIC PANEL (CC13)
ALT: 21 U/L (ref 0–55)
Alkaline Phosphatase: 98 U/L (ref 40–150)
Sodium: 134 mEq/L — ABNORMAL LOW (ref 136–145)
Total Bilirubin: 0.38 mg/dL (ref 0.20–1.20)
Total Protein: 7.4 g/dL (ref 6.4–8.3)

## 2012-08-04 NOTE — Telephone Encounter (Signed)
Message copied by Roza Creamer, Gerald Leitz on Mon Aug 04, 2012  5:29 PM ------      Message from: Victorino December      Created: Mon Aug 04, 2012  4:47 PM       Call patient: take K-dur 20 meq po BID x 7 days

## 2012-08-04 NOTE — Patient Instructions (Addendum)
Hold tamoxifen for 2 weeks  Call Dr. Welton Flakes with an update of symptoms of bowel incontinence  I will see you back in March 2014

## 2012-08-04 NOTE — Progress Notes (Signed)
OFFICE PROGRESS NOTE  CC  [name],[name], MD [address]   [place] [address]  DIAGNOSIS: 63 year old female with stage II invasive ductal carcinoma of the left breast status post mastectomy of the left breast with prophylactic right mastectomy.  PRIOR THERAPY:  #1 patient is status post bilateral mastectomies. She underwent a therapeutic left mastectomy for a stage II high-grade invasive ductal carcinoma that was ER positive PR positive HER-2/neu negative. Patient also had a prophylactic right mastectomy.  #2 patient is now status post adjuvant chemotherapy following NSABP B. 49 clinical trial. She received 4 cycles of dose dense Adriamycin and Cytoxan followed by weekly single agent Taxol completed December 2012.  #3 patient is on adjuvant antiestrogen therapy consisting of tamoxifen 20 mg daily daily. A total of 5 years therapy is planned. Aromasin and Arimidex have been discontinued as patient is not able tolerate these.  CURRENT THERAPY: Tamoxifen 20 mg daily  INTERVAL HISTORY: Krystal [name] 62 y.o. female returns for followup visit today.  Patient has noticed a peculiar side effects from tamoxifen to address is bowel incontinence. She is uncertain as to why this is happening. She has no urinary incontinence. She also has significant hot flashes and feeling warm all the time she is fatigued tired. She is very concerned about the bowel incontinence she has no weakness in her lower extremities she has no headaches no double vision no lower back pain. Remainder of the 10 point review of systems is negative.  MEDICAL HISTORY: Past Medical History  Diagnosis Date  . Fibromyalgia     muscle weakness and pain  . Depression   . Hypertension     benign  . Hyperlipidemia   . Rotator cuff rupture   . Arthritis     osteo  . Synovitis   . Rosacea   . Cataract   . Plantar fasciitis   . Ulcer   . Blindness of right eye at birth  . Osteoporosis   . Restless leg syndrome   .  Anemia   . Edema 07/27/2011  . Cancer     Left breast    ALLERGIES:   has no known allergies.  MEDICATIONS:  Current Outpatient Prescriptions  Medication Sig Dispense Refill  . alendronate (FOSAMAX) 70 MG tablet Take 70 mg by mouth every 7 (seven) days. Take with a full glass of water on an empty stomach.       Marland Kitchen amLODipine (NORVASC) 10 MG tablet Take 10 mg by mouth daily.        Marland Kitchen atenolol (TENORMIN) 100 MG tablet Take 100 mg by mouth daily.        . furosemide (LASIX) 20 MG tablet Take 40 mg by mouth 2 (two) times daily.       Marland Kitchen lisinopril (PRINIVIL,ZESTRIL) 20 MG tablet Take 20 mg by mouth daily.        . potassium chloride SA (K-DUR,KLOR-CON) 20 MEQ tablet Take 20 mEq by mouth 2 (two) times daily.      . pregabalin (LYRICA) 300 MG capsule Take 300 mg by mouth 2 (two) times daily.        . ranitidine (ZANTAC) 150 MG tablet Take 150 mg by mouth 2 (two) times daily.        . tamoxifen (NOLVADEX) 20 MG tablet Take 1 tablet (20 mg total) by mouth daily.  90 tablet  4  . zolpidem (AMBIEN CR) 12.5 MG CR tablet Take 10 mg by mouth at bedtime as needed. Pt takes  10mg  tablet at bedtime         SURGICAL HISTORY:  Past Surgical History  Procedure Date  . Abdominal hysterectomy 1986    For bleeding and pain  . Eye surgery   . Bladder surgery 2006    Bladder tack  . Throat surgery   . Nose surgery 2007  . Carpal tunnel release   . Neuroma surgery   . Mastectomy 2012    bilateral   Health Maintenance  Mammogram: 11/2010 Colonoscopy: 2010 Bone Density Scan: 2010 Pap Smear: 2011 Eye Exam: 2011 Vitamin D Level: with next draw Lipid Panel: 2012  REVIEW OF SYSTEMS:   General: fatigue (-), night sweats (-), fever (-), pain (-) Lymph: palpable nodes (-) HEENT: vision changes (-), mucositis (-), gum bleeding (-), epistaxis (-) Cardiovascular: chest pain (-), palpitations (-) Pulmonary: shortness of breath (-), dyspnea on exertion (-), cough (-), hemoptysis (-) GI:  Early satiety (-),  melena (-), dysphagia (-), nausea/vomiting (-), diarrhea (-) GU: dysuria (-), hematuria (-), incontinence (-) Musculoskeletal: joint swelling (-), joint pain (+), back pain (-) Neuro: weakness (-), numbness (-), headache (-), confusion (-) Skin: Rash (-), lesions (-), dryness (-) Psych: depression (-), suicidal/homicidal ideation (-), feeling of hopelessness (-)   PHYSICAL EXAMINATION:  BP 121/70  Pulse 88  Temp 97.9 F (36.6 C)  Resp 20  Ht 5\' 3"  (1.6 m)  Wt 186 lb 1.6 oz (84.414 kg)  BMI 32.97 kg/m2 General: Patient is a well appearing female in no acute distress HEENT: PERRLA, sclerae anicteric no conjunctival pallor, MMM Neck: supple, no palpable adenopathy Lungs: clear to auscultation bilaterally, no wheezes, rhonchi, or rales Cardiovascular: regular rate rhythm, S1, S2, no murmurs, rubs or gallops Abdomen: Soft, non-tender, non-distended, normoactive bowel sounds, no HSM Extremities: warm and well perfused, no clubbing, cyanosis, or edema Skin: No rashes or lesions Neuro: Non-focal ECOG PERFORMANCE STATUS: 1 - Symptomatic but completely ambulatory Breasts: bilateral mastectomy scars normal, no nodularity, or sign of recurrence.     LABORATORY DATA: Lab Results  Component Value Date   WBC 6.1 08/04/2012   HGB 14.5 08/04/2012   HCT 41.5 08/04/2012   MCV 88.9 08/04/2012   PLT 246 08/04/2012      Chemistry      Component Value Date/Time   NA 136 05/15/2012 1352   NA 139 02/08/2012 1003   K 3.5 05/15/2012 1352   K 3.8 02/08/2012 1003   CL 99 05/15/2012 1352   CL 98 02/08/2012 1003   CO2 26 05/15/2012 1352   CO2 29 02/08/2012 1003   BUN 14.0 05/15/2012 1352   BUN 18 02/08/2012 1003   CREATININE 0.7 05/15/2012 1352   CREATININE 0.90 02/08/2012 1003      Component Value Date/Time   CALCIUM 9.6 05/15/2012 1352   CALCIUM 9.7 02/08/2012 1003   ALKPHOS 106 05/15/2012 1352   ALKPHOS 95 02/08/2012 1003   AST 20 05/15/2012 1352   AST 19 02/08/2012 1003   ALT 20 05/15/2012 1352    ALT 16 02/08/2012 1003   BILITOT 0.60 05/15/2012 1352   BILITOT 0.6 02/08/2012 1003       RADIOGRAPHIC STUDIES:  No results found.  ASSESSMENT: 62 year old female with:  #1 stage II high-grade invasive ductal carcinoma with a positive lymph node patient is status post bilateral mastectomy followed by adjuvant chemotherapy following NSABP B. 49 clinical study. Patient is now on adjuvant Arimidex 1 mg daily.  Patient has no evidence of recurrent disease. However patient was not able  to tolerate this well and therefore it is discontinued. Patient was recommended to Aromasin but we have used this in the past. Therefore we will try tamoxifen 20 mg daily hopefully she can tolerate this.  PLAN:  #1 patient will hold tamoxifen for about 2 weeks to see if her symptoms improved.  #2 she will call me to update me symptoms of her bowel incontinence.  #3 I did recommend that patient be seen by a chief gastroenterologist and she will make these arrangements.  #4 she will be seen back in March 2014 for followup.  All questions were answered. The patient knows to call the clinic with any problems, questions or concerns. We can certainly see the patient much sooner if necessary.  I spent 25 minutes counseling the patient face to face. The total time spent in the appointment was 30 minutes.  Drue Second, MD Medical/Oncology [facility] [phone] (beeper) [phone] (Office)  08/04/2012, 2:48 PM

## 2012-08-04 NOTE — Progress Notes (Unsigned)
08/04/2012, NSABP B-49. 19 Month Follow Up Krystal [name] is in the cancer center today for physical exam and assessment 18 month after completing chemotherapy. She reports she has been taking tamoxifen but is experiencing side effects. She would like to stop the medication. Encouraged there to talk to Dr. Welton Flakes about her concerns.

## 2012-08-04 NOTE — Telephone Encounter (Signed)
Gave patient appointment for 09-22-2012

## 2012-08-04 NOTE — Telephone Encounter (Signed)
Per MD, pt to take K-dur 20 meq po BID x 7 days. Pt advised she  Is already taking daily of Potassium. Advised pt I will discuss with MD and call back with further instructions. In the mean time encouraged pt to eat foods rich in potassium. Pt verbalized understanding.

## 2012-08-25 ENCOUNTER — Telehealth: Payer: Self-pay | Admitting: Medical Oncology

## 2012-08-25 DIAGNOSIS — C50919 Malignant neoplasm of unspecified site of unspecified female breast: Secondary | ICD-10-CM

## 2012-08-25 MED ORDER — TAMOXIFEN CITRATE 10 MG PO TABS
10.0000 mg | ORAL_TABLET | Freq: Every day | ORAL | Status: DC
Start: 2012-08-25 — End: 2012-09-09

## 2012-08-25 NOTE — Telephone Encounter (Signed)
We will discuss it at the next appointment

## 2012-08-25 NOTE — Telephone Encounter (Signed)
Per MD, patient to take 10 mg daily and informed patient that prescription sent to her pharmacy and that  Dr Welton Flakes will discuss at patients upcoming appt. Patient expressed verbal understanding. No further questions at this time.

## 2012-08-25 NOTE — Telephone Encounter (Signed)
Pt LVMOM to report "since being off cancer drug, is feeling much better. Dr Welton Flakes mentioned that she may have to cut strength in two, she may have to do that or talk to me." Per notes on 01/13 patient to hold Tamoxifen for two weeks to see if symptoms improve and to report back  Patients next scheduled appt with L/MD 09/22/12. Will forward message to MD.

## 2012-09-05 ENCOUNTER — Other Ambulatory Visit: Payer: Medicare Other | Admitting: Lab

## 2012-09-05 ENCOUNTER — Ambulatory Visit: Payer: Medicare Other | Admitting: Oncology

## 2012-09-09 ENCOUNTER — Other Ambulatory Visit: Payer: Self-pay | Admitting: *Deleted

## 2012-09-09 DIAGNOSIS — C50919 Malignant neoplasm of unspecified site of unspecified female breast: Secondary | ICD-10-CM

## 2012-09-09 MED ORDER — TAMOXIFEN CITRATE 10 MG PO TABS: 10.0000 mg | ORAL_TABLET | Freq: Every day | ORAL | Status: DC

## 2012-09-12 ENCOUNTER — Other Ambulatory Visit: Payer: Self-pay | Admitting: Plastic Surgery

## 2012-09-12 ENCOUNTER — Encounter (HOSPITAL_BASED_OUTPATIENT_CLINIC_OR_DEPARTMENT_OTHER): Payer: Self-pay | Admitting: *Deleted

## 2012-09-12 DIAGNOSIS — Z9013 Acquired absence of bilateral breasts and nipples: Secondary | ICD-10-CM

## 2012-09-12 NOTE — H&P (Signed)
This document contains confidential information from a [facility] medical record system and may be unauthenticated. Release may be made only with a valid authorization or in accordance with applicable policies of Medical Center or its affiliates. This document must be maintained in a secure manner or discarded/destroyed as required by Medical Center policy or by a confidential means such as shredding.   Krystal [name]   09/05/2012 1:45 PM Office Visit  MRN: [id number]  Department: Plastic Surgery  Dept Phone: [phone]  Description: Female DOB: 03/07/1951  Provider: Banjamin Stovall Sanger, DO    Diagnoses  -  Acquired absence of bilateral breasts and nipples   - Primary    V45.71     Reason for Visit  -  Breast Reconstruction     Vitals - Last Recorded      125/71  80  97.5 F (36.4 C) (Oral)  1.6 m (5' 3")  81.647 kg (180 lb)  31.89 kg/m2         Subjective:    Patient ID: Krystal [name] is a 62 y.o. female.  HPI The patient is a 62 yrs old wf here for her history and physical for bilateral breast reconstruction. She was diagnosed with stage II invasive ductal carcinoma of the left breast and underwent bilateral mastectomies in 2012. She was ER/PR positive, HER-2/neu negative. She underwent adjuvant chemotherapy following NSABP B. 49 clinical trial (Adriamycin and Cytoxan followed by weekly single agent Taxol completed December 2012). She is on adjuvant antiestrogen therapy with tamoxifen 20 mg daily daily. A total of 5 years therapy is planned. Aromasin and Arimidex were discontinued for intolerance. She is 5 feet 3 inches tall and weighs 175 pounds. She was a 38DD bra size. She had left knee and left shoulder surgery and eye lid surgery. She also had a breast reduction in the past with the typical scars at the inframammary fold and vertical scar. Now she has the additional horizontal scar that is 1-2 cm cephalad to the inframammary scar. This puts her at risk for tissue loss between the  scars. She also has excess skin which will help with the expansion. She did not get any radiation.  The following portions of the patient's history were reviewed and updated as appropriate: allergies, current medications, past family history, past medical history, past social history, past surgical history and problem list.  Review of Systems  Constitutional: Negative.   HENT: Negative.   Eyes: Negative.   Respiratory: Negative.   Cardiovascular: Negative.   Gastrointestinal: Negative.   Genitourinary: Negative.   Musculoskeletal: Negative.   Hematological: Negative.   Psychiatric/Behavioral: Negative.       Objective:    Physical Exam  Constitutional: She appears well-developed and well-nourished.  HENT:   Head: Normocephalic and atraumatic.  Eyes: Conjunctivae normal and EOM are normal. Pupils are equal, round, and reactive to light.  Neck: Normal range of motion.  Cardiovascular: Normal rate.   Pulmonary/Chest: Effort normal. No respiratory distress.  Abdominal: Soft. She exhibits no distension.  Skin: Skin is warm.  Psychiatric: She has a normal mood and affect. Her behavior is normal. Judgment and thought content normal.      Assessment:   1.  Acquired absence of bilateral breasts and nipples        Plan:     Bilateral breast reconstruction with expander and ADM.      Medications Ordered This Encounter     cephalexin (KEFLEX) 500 MG capsule  Take 1 capsule (500 mg   total) by mouth 4 times daily.     diazepam (VALIUM) 2 MG tablet  Take 1 tablet (2 mg total) by mouth every 12 (twelve) hours as needed for 10 days for Anxiety.     HYDROcodone-acetaminophen (NORCO) 5-325 mg per tablet Take 1 tablet by mouth every 6 (six) hours as needed for 10 days for Pain.    ondansetron (ZOFRAN) 4 MG tablet  Take 1 tablet (4 mg total) by mouth every 8 (eight) hours as needed for 7 days for Nausea.     docusate sodium (COLACE) 100 MG capsule Take 1 capsule (100 mg total) by mouth 3 times  daily.      Discontinued Medications     lisinopril (PRINIVIL,ZESTRIL) 20 MG tablet      glucosamine-chondroitin 500-400 mg tablet      cyclobenzaprine (FLEXERIL) 10 MG tablet      OMEGA-3/DHA/EPA/FISH OIL (FISH OIL-OMEGA-3 FATTY ACIDS) 300-1,000 mg capsule      minocycline (DYNACIN) 50 MG tablet        

## 2012-09-12 NOTE — Progress Notes (Signed)
To come in for ekg-bmet Bring all meds and overnight bag

## 2012-09-15 ENCOUNTER — Encounter (HOSPITAL_BASED_OUTPATIENT_CLINIC_OR_DEPARTMENT_OTHER)
Admission: RE | Admit: 2012-09-15 | Discharge: 2012-09-15 | Disposition: A | Discharge status: Home / Self Care | Payer: Medicare HMO | Source: Ambulatory Visit | Attending: Plastic Surgery | Admitting: Plastic Surgery

## 2012-09-15 ENCOUNTER — Other Ambulatory Visit: Payer: Self-pay

## 2012-09-15 LAB — BASIC METABOLIC PANEL
BUN: 15 mg/dL (ref 6–23)
Chloride: 98 mEq/L (ref 96–112)
GFR calc Af Amer: 73 mL/min — ABNORMAL LOW (ref >90)
GFR calc non Af Amer: 63 mL/min — ABNORMAL LOW (ref >90)
Potassium: 3.8 mEq/L (ref 3.5–5.1)
Sodium: 139 mEq/L (ref 135–145)

## 2012-09-17 ENCOUNTER — Encounter (HOSPITAL_BASED_OUTPATIENT_CLINIC_OR_DEPARTMENT_OTHER): Payer: Self-pay | Admitting: Plastic Surgery

## 2012-09-17 ENCOUNTER — Encounter (HOSPITAL_BASED_OUTPATIENT_CLINIC_OR_DEPARTMENT_OTHER): Payer: Self-pay | Admitting: Anesthesiology

## 2012-09-17 ENCOUNTER — Encounter (HOSPITAL_BASED_OUTPATIENT_CLINIC_OR_DEPARTMENT_OTHER)
Admission: RE | Disposition: A | Discharge status: Home / Self Care | Payer: Self-pay | Source: Ambulatory Visit | Attending: Plastic Surgery

## 2012-09-17 ENCOUNTER — Ambulatory Visit (HOSPITAL_BASED_OUTPATIENT_CLINIC_OR_DEPARTMENT_OTHER)
Admission: RE | Admit: 2012-09-17 | Discharge: 2012-09-17 | Disposition: A | Discharge status: Home / Self Care | Payer: Medicare HMO | Source: Ambulatory Visit | Attending: Plastic Surgery | Admitting: Plastic Surgery

## 2012-09-17 ENCOUNTER — Ambulatory Visit (HOSPITAL_BASED_OUTPATIENT_CLINIC_OR_DEPARTMENT_OTHER): Payer: Medicare HMO | Admitting: Anesthesiology

## 2012-09-17 DIAGNOSIS — Z01810 Encounter for preprocedural cardiovascular examination: Secondary | ICD-10-CM | POA: Insufficient documentation

## 2012-09-17 DIAGNOSIS — I10 Essential (primary) hypertension: Secondary | ICD-10-CM | POA: Insufficient documentation

## 2012-09-17 DIAGNOSIS — Z9013 Acquired absence of bilateral breasts and nipples: Secondary | ICD-10-CM

## 2012-09-17 DIAGNOSIS — Z01812 Encounter for preprocedural laboratory examination: Secondary | ICD-10-CM | POA: Insufficient documentation

## 2012-09-17 DIAGNOSIS — Z853 Personal history of malignant neoplasm of breast: Secondary | ICD-10-CM | POA: Insufficient documentation

## 2012-09-17 DIAGNOSIS — Z9010 Acquired absence of unspecified breast and nipple: Secondary | ICD-10-CM | POA: Insufficient documentation

## 2012-09-17 DIAGNOSIS — Z421 Encounter for breast reconstruction following mastectomy: Secondary | ICD-10-CM | POA: Insufficient documentation

## 2012-09-17 HISTORY — PX: BREAST RECONSTRUCTION: SHX9

## 2012-09-17 LAB — POCT HEMOGLOBIN-HEMACUE: Hemoglobin: 14 g/dL (ref 12.0–15.0)

## 2012-09-17 SURGERY — RECONSTRUCTION, BREAST
Anesthesia: General | Site: Breast | Laterality: Bilateral | Wound class: Clean

## 2012-09-17 MED ORDER — LACTATED RINGERS IV SOLN
INTRAVENOUS | Status: DC
  Administered 2012-09-17 (×3): via INTRAVENOUS

## 2012-09-17 MED ORDER — CEFAZOLIN SODIUM-DEXTROSE 2-3 GM-% IV SOLR: 2.0000 g | INTRAVENOUS | Status: DC

## 2012-09-17 MED ORDER — MIDAZOLAM HCL 2 MG/2ML IJ SOLN: 1.0000 mg | INTRAMUSCULAR | Status: DC | PRN

## 2012-09-17 MED ORDER — OXYCODONE HCL 5 MG PO TABS
5.0000 mg | ORAL_TABLET | Freq: Once | ORAL | Status: AC | PRN
  Administered 2012-09-17: 5 mg via ORAL

## 2012-09-17 MED ORDER — DEXAMETHASONE SODIUM PHOSPHATE 4 MG/ML IJ SOLN
INTRAMUSCULAR | Status: DC | PRN
  Administered 2012-09-17: 10 mg via INTRAVENOUS

## 2012-09-17 MED ORDER — FENTANYL CITRATE 0.05 MG/ML IJ SOLN
50.0000 ug | INTRAMUSCULAR | Status: DC | PRN
  Administered 2012-09-17: 50 ug via INTRAVENOUS
  Administered 2012-09-17: 100 ug via INTRAVENOUS
  Administered 2012-09-17 (×2): 50 ug via INTRAVENOUS

## 2012-09-17 MED ORDER — EPHEDRINE SULFATE 50 MG/ML IJ SOLN
INTRAMUSCULAR | Status: DC | PRN
  Administered 2012-09-17 (×4): 10 mg via INTRAVENOUS

## 2012-09-17 MED ORDER — HYDROMORPHONE HCL PF 1 MG/ML IJ SOLN
0.2500 mg | INTRAMUSCULAR | Status: DC | PRN
  Administered 2012-09-17 (×4): 0.5 mg via INTRAVENOUS

## 2012-09-17 MED ORDER — SODIUM CHLORIDE 0.9 % IR SOLN
Status: DC | PRN
  Administered 2012-09-17: 12:00:00

## 2012-09-17 MED ORDER — ONDANSETRON HCL 4 MG/2ML IJ SOLN: 4.0000 mg | Freq: Once | INTRAMUSCULAR | Status: DC | PRN

## 2012-09-17 MED ORDER — PROPOFOL 10 MG/ML IV BOLUS
INTRAVENOUS | Status: DC | PRN
  Administered 2012-09-17: 200 mg via INTRAVENOUS

## 2012-09-17 MED ORDER — ONDANSETRON HCL 4 MG/2ML IJ SOLN
INTRAMUSCULAR | Status: DC | PRN
  Administered 2012-09-17: 4 mg via INTRAVENOUS

## 2012-09-17 MED ORDER — MIDAZOLAM HCL 5 MG/5ML IJ SOLN
INTRAMUSCULAR | Status: DC | PRN
  Administered 2012-09-17: 2 mg via INTRAVENOUS

## 2012-09-17 MED ORDER — LIDOCAINE HCL (CARDIAC) 20 MG/ML IV SOLN
INTRAVENOUS | Status: DC | PRN
  Administered 2012-09-17: 50 mg via INTRAVENOUS

## 2012-09-17 MED ORDER — OXYCODONE HCL 5 MG/5ML PO SOLN: 5.0000 mg | Freq: Once | ORAL | Status: AC | PRN

## 2012-09-17 MED ORDER — FENTANYL CITRATE 0.05 MG/ML IJ SOLN: INTRAMUSCULAR | Status: DC | PRN

## 2012-09-17 SURGICAL SUPPLY — 71 items
ADH SKN CLS APL DERMABOND .7 (GAUZE/BANDAGES/DRESSINGS) ×2
BAG DECANTER FOR FLEXI CONT (MISCELLANEOUS) ×2 IMPLANT
BANDAGE GAUZE ELAST BULKY 4 IN (GAUZE/BANDAGES/DRESSINGS) ×4 IMPLANT
BINDER BREAST LRG (GAUZE/BANDAGES/DRESSINGS) ×1 IMPLANT
BINDER BREAST MEDIUM (GAUZE/BANDAGES/DRESSINGS) IMPLANT
BINDER BREAST XLRG (GAUZE/BANDAGES/DRESSINGS) IMPLANT
BINDER BREAST XXLRG (GAUZE/BANDAGES/DRESSINGS) IMPLANT
BIOPATCH RED 1 DISK 7.0 (GAUZE/BANDAGES/DRESSINGS) IMPLANT
BLADE HEX COATED 2.75 (ELECTRODE) ×2 IMPLANT
BLADE SURG 15 STRL LF DISP TIS (BLADE) ×1 IMPLANT
BLADE SURG 15 STRL SS (BLADE) ×2
CANISTER SUCTION 1200CC (MISCELLANEOUS) ×2 IMPLANT
CHLORAPREP W/TINT 26ML (MISCELLANEOUS) ×2 IMPLANT
CLOTH BEACON ORANGE TIMEOUT ST (SAFETY) ×2 IMPLANT
COVER MAYO STAND STRL (DRAPES) ×2 IMPLANT
COVER TABLE BACK 60X90 (DRAPES) ×2 IMPLANT
DECANTER SPIKE VIAL GLASS SM (MISCELLANEOUS) IMPLANT
DERMABOND ADVANCED (GAUZE/BANDAGES/DRESSINGS) ×2
DERMABOND ADVANCED .7 DNX12 (GAUZE/BANDAGES/DRESSINGS) ×1 IMPLANT
DRAIN CHANNEL 19F RND (DRAIN) ×2 IMPLANT
DRAPE LAPAROSCOPIC ABDOMINAL (DRAPES) ×2 IMPLANT
DRSG PAD ABDOMINAL 8X10 ST (GAUZE/BANDAGES/DRESSINGS) ×4 IMPLANT
DRSG TEGADERM 2-3/8X2-3/4 SM (GAUZE/BANDAGES/DRESSINGS) IMPLANT
ELECT BLADE 4.0 EZ CLEAN MEGAD (MISCELLANEOUS) ×2
ELECT BLADE 6.5 .24CM SHAFT (ELECTRODE) ×1 IMPLANT
ELECT REM PT RETURN 9FT ADLT (ELECTROSURGICAL) ×2
ELECTRODE BLDE 4.0 EZ CLN MEGD (MISCELLANEOUS) ×1 IMPLANT
ELECTRODE REM PT RTRN 9FT ADLT (ELECTROSURGICAL) ×1 IMPLANT
EVACUATOR SILICONE 100CC (DRAIN) ×2 IMPLANT
GAUZE SPONGE 4X4 12PLY STRL LF (GAUZE/BANDAGES/DRESSINGS) IMPLANT
GLOVE BIO SURGEON STRL SZ 6.5 (GLOVE) ×2 IMPLANT
GLOVE SKINSENSE NS SZ6.5 (GLOVE) ×2
GLOVE SKINSENSE NS SZ7.0 (GLOVE) ×2
GLOVE SKINSENSE STRL SZ6.5 (GLOVE) ×1 IMPLANT
GLOVE SKINSENSE STRL SZ7.0 (GLOVE) IMPLANT
GOWN PREVENTION PLUS XLARGE (GOWN DISPOSABLE) ×6 IMPLANT
GOWN PREVENTION PLUS XXLARGE (GOWN DISPOSABLE) ×1 IMPLANT
IV NS 1000ML (IV SOLUTION) ×2
IV NS 1000ML BAXH (IV SOLUTION) IMPLANT
IV NS 500ML (IV SOLUTION) ×2
IV NS 500ML BAXH (IV SOLUTION) ×1 IMPLANT
KIT FILL SYSTEM UNIVERSAL (SET/KITS/TRAYS/PACK) ×4 IMPLANT
NDL 21 GA WING INFUSION (NEEDLE) IMPLANT
NDL HYPO 25X1 1.5 SAFETY (NEEDLE) IMPLANT
NDL SPNL 22GX3.5 QUINCKE BK (NEEDLE) IMPLANT
NEEDLE 21 GA WING INFUSION (NEEDLE) ×2 IMPLANT
NEEDLE HYPO 25X1 1.5 SAFETY (NEEDLE) IMPLANT
NEEDLE SPNL 22GX3.5 QUINCKE BK (NEEDLE) IMPLANT
NS IRRIG 1000ML POUR BTL (IV SOLUTION) IMPLANT
PACK BASIN DAY SURGERY FS (CUSTOM PROCEDURE TRAY) ×2 IMPLANT
PENCIL BUTTON HOLSTER BLD 10FT (ELECTRODE) ×2 IMPLANT
PIN SAFETY STERILE (MISCELLANEOUS) ×1 IMPLANT
SLEEVE SCD COMPRESS KNEE MED (MISCELLANEOUS) ×2 IMPLANT
SPONGE LAP 18X18 X RAY DECT (DISPOSABLE) ×5 IMPLANT
SUT MON AB 5-0 PS2 18 (SUTURE) ×3 IMPLANT
SUT PDS 3-0 CT2 (SUTURE)
SUT PDS AB 2-0 CT2 27 (SUTURE) ×3 IMPLANT
SUT PDS II 3-0 CT2 27 ABS (SUTURE) IMPLANT
SUT SILK 3 0 PS 1 (SUTURE) ×1 IMPLANT
SUT VIC AB 3-0 SH 27 (SUTURE) ×6
SUT VIC AB 3-0 SH 27X BRD (SUTURE) ×1 IMPLANT
SUT VICRYL 4-0 PS2 18IN ABS (SUTURE) ×3 IMPLANT
SYR 50ML LL SCALE MARK (SYRINGE) ×1 IMPLANT
SYR BULB IRRIGATION 50ML (SYRINGE) ×2 IMPLANT
SYR CONTROL 10ML LL (SYRINGE) IMPLANT
TISSUE ALLOMEND 4X16 THICK (Tissue) ×2 IMPLANT
TISSUE EXPANDER 350CC (Miscellaneous) ×2 IMPLANT
TOWEL OR 17X24 6PK STRL BLUE (TOWEL DISPOSABLE) ×4 IMPLANT
TUBE CONNECTING 20X1/4 (TUBING) ×2 IMPLANT
UNDERPAD 30X30 INCONTINENT (UNDERPADS AND DIAPERS) ×4 IMPLANT
YANKAUER SUCT BULB TIP NO VENT (SUCTIONS) ×2 IMPLANT

## 2012-09-17 NOTE — Interval H&P Note (Signed)
History and Physical Interval Note:  09/17/2012 9:30 AM  Krystal [name]  has presented today for surgery, with the diagnosis of BILATERAL BREAST CANCER  The various methods of treatment have been discussed with the patient and family. After consideration of risks, benefits and other options for treatment, the patient has consented to  Procedure(s) with comments: BREAST RECONSTRUCTION (Bilateral) - BILATERAL BREAST RECONSTRUCTION WITH TISSUE EXPANDERS AND ALLOMED as a surgical intervention .  The patient's history has been reviewed, patient examined, no change in status, stable for surgery.  I have reviewed the patient's chart and labs.  Questions were answered to the patient's satisfaction.     SANGER,Cayne Yom

## 2012-09-17 NOTE — Anesthesia Procedure Notes (Signed)
Procedure Name: LMA Insertion Date/Time: 09/17/2012 11:22 AM Performed by: Zenia Resides D Pre-anesthesia Checklist: Patient identified, Emergency Drugs available, Suction available and Patient being monitored Patient Re-evaluated:Patient Re-evaluated prior to inductionOxygen Delivery Method: Circle System Utilized Preoxygenation: Pre-oxygenation with 100% oxygen Intubation Type: IV induction Ventilation: Mask ventilation without difficulty LMA: LMA inserted LMA Size: 4.0 Number of attempts: 1 Airway Equipment and Method: bite block Placement Confirmation: positive ETCO2 Tube secured with: Tape Dental Injury: Teeth and Oropharynx as per pre-operative assessment

## 2012-09-17 NOTE — Op Note (Signed)
Op report Bilateral Exchange   SURGICAL DIVISION: Plastic Surgery  PREOPERATIVE DIAGNOSES:  1. History of breast cancer.  2. Acquired absence of bilateral breast.   POSTOPERATIVE DIAGNOSES:  1. History of breast cancer.  2. Acquired absence of bilateral breast.   PROCEDURE:  1. Bilateral placement of breast tissue expanders. 2. Bilateral placement of Allomed.  SURGEON: Wayland Denis, DO  ASSISTANT: Harrie Foreman, LPN  ANESTHESIA:  General.   COMPLICATIONS: None.   EXPANDERS: Left - Mentor Round Medium Height expander 150/350cc.  Right - Mentor Round Medium Height expander 150/350cc.  INDICATIONS FOR PROCEDURE:  The patient is a 62 yrs old wf with a history of bilateral mastectomies for treatment of breast cancer.     CONSENT:  Informed consent was obtained directly from the patient. Risks, benefits and alternatives were fully discussed. Specific risks including but not limited to bleeding, infection, hematoma, seroma, scarring, pain, implant infection, implant extrusion, capsular contracture, asymmetry, wound healing problems, and need for further surgery were all discussed. The patient did have an ample opportunity to have her questions answered to her satisfaction.   DESCRIPTION OF PROCEDURE:  The patient was taken to the operating room. SCDs were placed and IV antibiotics were given. A time out was called and all information was correct.  The patient's chest was prepped and draped in a sterile fashion. A time out was performed and the expanders to be used were identified.   The old mastectomy scar was opened and the tissue sent to pathology.  The superior mastectomy and inferior mastectomy flaps were re-raised over the pectoralis major muscle. The pectoralis was then lifted from the chest wall to create a space for the tissue expander. Inspection of the pocket showed a normal healthy pocket and no abnormalities.   The right lateral capsule was excised and sent for pathology.    The Allomed was prepared according to the manufactures guidelines.  The porous side was placed against the mastectomy flap and sutured to the pectoralis with 2-0 PDS.  Slits were placed in the Allomed to aid with drainage.  The ADM was then sutured to the inframammary fold with the 2-0 PDS.  The expander was prepared according to the manufacture guidelines and the air evacuated.  The expander was placed under the pectoralis muscle and Allomed.  Each expander was inflated with 150 cc of normal injectable saline.  Hemostasis was obtained with electrocautery. The pockets were irrigated with antibiotic solution. Once the expander was in place the lateral portion of the ADM was secured to the serratus muscle with 2-0 PDS.  The drain was placed in the pocket through a #15 blade incision in the skin and secured with 3-0 silk.  The deep layers were closed with 3-0 vicryl followed by 4-0 vicryl.  The skin edges were reapproximated with 5-0 Monocryl. Dermabond was applied with ABDs and a breast binder.  The patient tolerated the procedure well.  She was awakened from anesthesia and taken to the recovery room in satisfactory condition.

## 2012-09-17 NOTE — Interval H&P Note (Signed)
History and Physical Interval Note:  09/17/2012 10:34 AM  Krystal [name]  has presented today for surgery, with the diagnosis of BILATERAL BREAST CANCER  The various methods of treatment have been discussed with the patient and family. After consideration of risks, benefits and other options for treatment, the patient has consented to  Procedure(s) with comments: BREAST RECONSTRUCTION (Bilateral) - BILATERAL BREAST RECONSTRUCTION WITH TISSUE EXPANDERS AND ALLOMED as a surgical intervention .  The patient's history has been reviewed, patient examined, no change in status, stable for surgery.  I have reviewed the patient's chart and labs.  Questions were answered to the patient's satisfaction.     SANGER,Mili Piltz

## 2012-09-17 NOTE — Anesthesia Preprocedure Evaluation (Signed)
Anesthesia Evaluation  Patient identified by MRN, date of birth, ID band Patient awake    Reviewed: Allergy & Precautions, H&P , NPO status , Patient's Chart, lab work & pertinent test results  Airway Mallampati: I TM Distance: >3 FB Neck ROM: Full    Dental  (+) Teeth Intact and Dental Advisory Given   Pulmonary  breath sounds clear to auscultation        Cardiovascular hypertension, Pt. on medications Rhythm:Regular Rate:Normal     Neuro/Psych    GI/Hepatic   Endo/Other    Renal/GU      Musculoskeletal   Abdominal   Peds  Hematology   Anesthesia Other Findings   Reproductive/Obstetrics                           Anesthesia Physical Anesthesia Plan  ASA: III  Anesthesia Plan: General   Post-op Pain Management:    Induction: Intravenous  Airway Management Planned: LMA  Additional Equipment:   Intra-op Plan:   Post-operative Plan: Extubation in OR  Informed Consent: I have reviewed the patients History and Physical, chart, labs and discussed the procedure including the risks, benefits and alternatives for the proposed anesthesia with the patient or authorized representative who has indicated his/her understanding and acceptance.   Dental advisory given  Plan Discussed with: CRNA, Anesthesiologist and Surgeon  Anesthesia Plan Comments:         Anesthesia Quick Evaluation  

## 2012-09-17 NOTE — Anesthesia Postprocedure Evaluation (Signed)
  Anesthesia Post-op Note  Patient: Krystal [name]  Procedure(s) Performed: Procedure(s) with comments: BREAST RECONSTRUCTION (Bilateral) - BILATERAL BREAST RECONSTRUCTION WITH TISSUE EXPANDERS AND ALLOMED  Patient Location: PACU  Anesthesia Type:General  Level of Consciousness: awake, alert  and oriented  Airway and Oxygen Therapy: Patient Spontanous Breathing  Post-op Pain: mild  Post-op Assessment: Post-op Vital signs reviewed  Post-op Vital Signs: Reviewed  Complications: No apparent anesthesia complications

## 2012-09-17 NOTE — H&P (View-Only) (Signed)
This document contains confidential information from a [facility] medical record system and may be unauthenticated. Release may be made only with a valid authorization or in accordance with applicable policies of Medical Center or its affiliates. This document must be maintained in a secure manner or discarded/destroyed as required by [facility] Center policy or by a confidential means such as shredding.   Krystal [name]   09/05/2012 1:45 PM Office Visit  MRN: [id number]  Department: Plastic Surgery  Dept Phone: [phone]  Description: Female DOB: 09-14-1950  Provider: Wayland Denis, DO    Diagnoses  -  Acquired absence of bilateral breasts and nipples   - Primary    V45.71     Reason for Visit  -  Breast Reconstruction     Vitals - Last Recorded      125/71  80  97.5 F (36.4 C) (Oral)  1.6 m (5\' 3" )  81.647 kg (180 lb)  31.89 kg/m2         Subjective:    Patient ID: Krystal [name] is a 62 y.o. female.  HPI The patient is a 62 yrs old wf here for her history and physical for bilateral breast reconstruction. She was diagnosed with stage II invasive ductal carcinoma of the left breast and underwent bilateral mastectomies in 2012. She was ER/PR positive, HER-2/neu negative. She underwent adjuvant chemotherapy following NSABP B. 49 clinical trial (Adriamycin and Cytoxan followed by weekly single agent Taxol completed December 2012). She is on adjuvant antiestrogen therapy with tamoxifen 20 mg daily daily. A total of 5 years therapy is planned. Aromasin and Arimidex were discontinued for intolerance. She is 5 feet 3 inches tall and weighs 175 pounds. She was a 38DD bra size. She had left knee and left shoulder surgery and eye lid surgery. She also had a breast reduction in the past with the typical scars at the inframammary fold and vertical scar. Now she has the additional horizontal scar that is 1-2 cm cephalad to the inframammary scar. This puts her at risk for tissue loss between the  scars. She also has excess skin which will help with the expansion. She did not get any radiation.  The following portions of the patient's history were reviewed and updated as appropriate: allergies, current medications, past family history, past medical history, past social history, past surgical history and problem list.  Review of Systems  Constitutional: Negative.   HENT: Negative.   Eyes: Negative.   Respiratory: Negative.   Cardiovascular: Negative.   Gastrointestinal: Negative.   Genitourinary: Negative.   Musculoskeletal: Negative.   Hematological: Negative.   Psychiatric/Behavioral: Negative.       Objective:    Physical Exam  Constitutional: She appears well-developed and well-nourished.  HENT:   Head: Normocephalic and atraumatic.  Eyes: Conjunctivae normal and EOM are normal. Pupils are equal, round, and reactive to light.  Neck: Normal range of motion.  Cardiovascular: Normal rate.   Pulmonary/Chest: Effort normal. No respiratory distress.  Abdominal: Soft. She exhibits no distension.  Skin: Skin is warm.  Psychiatric: She has a normal mood and affect. Her behavior is normal. Judgment and thought content normal.      Assessment:   1.  Acquired absence of bilateral breasts and nipples        Plan:     Bilateral breast reconstruction with expander and ADM.      Medications Ordered This Encounter     cephalexin (KEFLEX) 500 MG capsule  Take 1 capsule (500 mg  total) by mouth 4 times daily.     diazepam (VALIUM) 2 MG tablet  Take 1 tablet (2 mg total) by mouth every 12 (twelve) hours as needed for 10 days for Anxiety.     HYDROcodone-acetaminophen (NORCO) 5-325 mg per tablet Take 1 tablet by mouth every 6 (six) hours as needed for 10 days for Pain.    ondansetron (ZOFRAN) 4 MG tablet  Take 1 tablet (4 mg total) by mouth every 8 (eight) hours as needed for 7 days for Nausea.     docusate sodium (COLACE) 100 MG capsule Take 1 capsule (100 mg total) by mouth 3 times  daily.      Discontinued Medications     lisinopril (PRINIVIL,ZESTRIL) 20 MG tablet      glucosamine-chondroitin 500-400 mg tablet      cyclobenzaprine (FLEXERIL) 10 MG tablet      OMEGA-3/DHA/EPA/FISH OIL (FISH OIL-OMEGA-3 FATTY ACIDS) 300-1,000 mg capsule      minocycline (DYNACIN) 50 MG tablet

## 2012-09-17 NOTE — Brief Op Note (Signed)
09/17/2012  1:30 PM  PATIENT:  Krystal [name]  62 y.o. female  PRE-OPERATIVE DIAGNOSIS:  BILATERAL BREAST CANCER  POST-OPERATIVE DIAGNOSIS:  BILATERAL BREAST CANCER  PROCEDURE:  Procedure(s) with comments: BREAST RECONSTRUCTION (Bilateral) - BILATERAL BREAST RECONSTRUCTION WITH TISSUE EXPANDERS AND ALLOMED  SURGEON:  Surgeon(s) and Role:    * Relena Ivancic Sanger, DO - Primary  PHYSICIAN ASSISTANT: none  ASSISTANTS: Harrie Foreman, LPN   ANESTHESIA:   general  EBL:  Total I/O In: 2000 [I.V.:2000] Out: -   BLOOD ADMINISTERED:none  DRAINS: (2) Jackson-Pratt drain(s) with closed bulb suction in the one in each breast pocket   LOCAL MEDICATIONS USED:  NONE  SPECIMEN:  Source of Specimen:  mastectomy scar bilateral and right lateral capsule  DISPOSITION OF SPECIMEN:  PATHOLOGY  COUNTS:  YES  TOURNIQUET:  * No tourniquets in log *  DICTATION: .Dragon Dictation  PLAN OF CARE: Discharge to home after PACU  PATIENT DISPOSITION:  PACU - hemodynamically stable.   Delay start of Pharmacological VTE agent (>24hrs) due to surgical blood loss or risk of bleeding: no

## 2012-09-17 NOTE — Transfer of Care (Signed)
Immediate Anesthesia Transfer of Care Note  Patient: Krystal [name]  Procedure(s) Performed: Procedure(s) with comments: BREAST RECONSTRUCTION (Bilateral) - BILATERAL BREAST RECONSTRUCTION WITH TISSUE EXPANDERS AND ALLOMED  Patient Location: PACU  Anesthesia Type:General  Level of Consciousness: awake and alert   Airway & Oxygen Therapy: Patient Spontanous Breathing  Post-op Assessment: Report given to PACU RN and Post -op Vital signs reviewed and stable  Post vital signs: Reviewed and stable  Complications: No apparent anesthesia complications

## 2012-09-18 ENCOUNTER — Encounter (HOSPITAL_BASED_OUTPATIENT_CLINIC_OR_DEPARTMENT_OTHER): Payer: Self-pay | Admitting: Plastic Surgery

## 2012-09-19 ENCOUNTER — Telehealth: Payer: Self-pay | Admitting: Medical Oncology

## 2012-09-19 NOTE — Telephone Encounter (Signed)
Pt LVMOM asking if Dr Welton Flakes still wants to see her for appt sched 03/03 since she just had reconstructive surgery 02/26. Reviewed with MD, per MD ok to cancel and reschedule appt 1-2 months from now. Onc treatment sent, scheduling to call patient with new appt.

## 2012-09-22 ENCOUNTER — Ambulatory Visit: Payer: Medicare HMO | Admitting: Oncology

## 2012-09-22 ENCOUNTER — Other Ambulatory Visit: Payer: Medicare HMO | Admitting: Lab

## 2012-09-23 ENCOUNTER — Telehealth: Payer: Self-pay | Admitting: Oncology

## 2012-09-23 NOTE — Telephone Encounter (Signed)
S/w the pt and she is aware of her lab and md appts in April 2014.

## 2012-11-17 ENCOUNTER — Other Ambulatory Visit (HOSPITAL_BASED_OUTPATIENT_CLINIC_OR_DEPARTMENT_OTHER): Payer: Medicare HMO | Admitting: Lab

## 2012-11-17 ENCOUNTER — Telehealth: Payer: Self-pay | Admitting: *Deleted

## 2012-11-17 ENCOUNTER — Ambulatory Visit (HOSPITAL_BASED_OUTPATIENT_CLINIC_OR_DEPARTMENT_OTHER): Payer: Medicare HMO | Admitting: Oncology

## 2012-11-17 ENCOUNTER — Telehealth: Payer: Self-pay | Admitting: Oncology

## 2012-11-17 ENCOUNTER — Encounter: Payer: Self-pay | Admitting: Oncology

## 2012-11-17 VITALS — BP 123/77 | HR 79 | Temp 98.4°F | Resp 20 | Ht 63.0 in | Wt 180.8 lb

## 2012-11-17 DIAGNOSIS — Z170 Estrogen receptor positive status [ER+]: Secondary | ICD-10-CM

## 2012-11-17 DIAGNOSIS — C50219 Malignant neoplasm of upper-inner quadrant of unspecified female breast: Secondary | ICD-10-CM

## 2012-11-17 DIAGNOSIS — C50919 Malignant neoplasm of unspecified site of unspecified female breast: Secondary | ICD-10-CM

## 2012-11-17 DIAGNOSIS — C773 Secondary and unspecified malignant neoplasm of axilla and upper limb lymph nodes: Secondary | ICD-10-CM

## 2012-11-17 LAB — COMPREHENSIVE METABOLIC PANEL (CC13)
Albumin: 3.8 g/dL (ref 3.5–5.0)
CO2: 29 mEq/L (ref 22–29)
Calcium: 9.3 mg/dL (ref 8.4–10.4)
Chloride: 102 mEq/L (ref 98–107)
Glucose: 106 mg/dl — ABNORMAL HIGH (ref 70–99)
Sodium: 141 mEq/L (ref 136–145)
Total Bilirubin: 0.52 mg/dL (ref 0.20–1.20)
Total Protein: 7.2 g/dL (ref 6.4–8.3)

## 2012-11-17 LAB — CBC WITH DIFFERENTIAL/PLATELET
Basophils Absolute: 0.1 10*3/uL (ref 0.0–0.1)
Eosinophils Absolute: 0.5 10*3/uL (ref 0.0–0.5)
HGB: 14.6 g/dL (ref 11.6–15.9)
MCV: 90.2 fL (ref 79.5–101.0)
MONO%: 7.3 % (ref 0.0–14.0)
NEUT#: 3.6 10*3/uL (ref 1.5–6.5)
Platelets: 286 10*3/uL (ref 145–400)
RDW: 13.6 % (ref 11.2–14.5)

## 2012-11-17 NOTE — Patient Instructions (Addendum)
Continue tamoxifen 20 mg daily  I will see you back in July

## 2012-11-17 NOTE — Telephone Encounter (Signed)
appts made and printed...td 

## 2012-11-17 NOTE — Progress Notes (Signed)
OFFICE PROGRESS NOTE  CC  [name],[name], MD [address]   [place] [address]  DIAGNOSIS: 62 year old female with stage II invasive ductal carcinoma of the left breast status post mastectomy of the left breast with prophylactic right mastectomy.  PRIOR THERAPY:  #1 patient is status post bilateral mastectomies. She underwent a therapeutic left mastectomy for a stage II high-grade invasive ductal carcinoma that was ER positive PR positive HER-2/neu negative. Patient also had a prophylactic right mastectomy.  #2 patient is now status post adjuvant chemotherapy following NSABP B. 49 clinical trial. She received 4 cycles of dose dense Adriamycin and Cytoxan followed by weekly single agent Taxol completed December 2012.  #3 patient is on adjuvant antiestrogen therapy consisting of tamoxifen 20 mg daily daily. A total of 5 years therapy is planned. Aromasin and Arimidex have been discontinued as patient is not able tolerate these.  #4 patient has begun the process of reconstruction. She now has bilateral implants in place. She will have exchange soon.  CURRENT THERAPY: Tamoxifen 20 mg daily  INTERVAL HISTORY: Krystal [name] 62 y.o. female returns for followup visit today.  Patient is taking tamoxifen. She still is experiencing considerable amount of hot flashes with significant sweating. Otherwise she has no aches or pains. She denies any nausea or vomiting. She is very pleased with her reconstructive process of the bilateral breasts. This is being done by Dr. Kelly Splinter. Remainder of the 10 point review of systems is negative.   MEDICAL HISTORY: Past Medical History  Diagnosis Date  . Fibromyalgia     muscle weakness and pain  . Depression   . Hypertension     benign  . Hyperlipidemia   . Rotator cuff rupture   . Arthritis     osteo  . Synovitis   . Rosacea   . Cataract   . Plantar fasciitis   . Ulcer   . Blindness of right eye at birth  . Osteoporosis   . Restless leg  syndrome   . Anemia   . Edema 07/27/2011  . Cancer     Left breast    ALLERGIES:  has No Known Allergies.  MEDICATIONS:  Current Outpatient Prescriptions  Medication Sig Dispense Refill  . alendronate (FOSAMAX) 70 MG tablet Take 70 mg by mouth every 7 (seven) days. Take with a full glass of water on an empty stomach.       Marland Kitchen amLODipine (NORVASC) 10 MG tablet       . atenolol (TENORMIN) 100 MG tablet Take 100 mg by mouth daily.        . calcium carbonate (OS-CAL) 600 MG TABS Take 600 mg by mouth 2 (two) times daily with a meal.      . cholecalciferol (VITAMIN D) 1000 UNITS tablet Take 1,000 Units by mouth daily.      . furosemide (LASIX) 20 MG tablet Take 40 mg by mouth 2 (two) times daily.       Marland Kitchen HYDROcodone-acetaminophen (NORCO/VICODIN) 5-325 MG per tablet Take 1 tablet by mouth every 4 (four) hours as needed for pain.      Marland Kitchen lisinopril-hydrochlorothiazide (PRINZIDE,ZESTORETIC) 20-25 MG per tablet       . potassium chloride SA (K-DUR,KLOR-CON) 20 MEQ tablet Take 20 mEq by mouth 2 (two) times daily.      . pregabalin (LYRICA) 300 MG capsule Take 300 mg by mouth 2 (two) times daily.        . ranitidine (ZANTAC) 150 MG tablet Take 150 mg by  mouth 2 (two) times daily.        Marland Kitchen rOPINIRole (REQUIP) 0.5 MG tablet Take 0.5 mg by mouth every evening.      . tamoxifen (NOLVADEX) 10 MG tablet Take 1 tablet (10 mg total) by mouth daily.  90 tablet  0  . zolpidem (AMBIEN CR) 12.5 MG CR tablet Take 10 mg by mouth at bedtime as needed. Pt takes 10mg  tablet at bedtime       . aspirin 81 MG tablet Take 81 mg by mouth daily.      . carisoprodol (SOMA) 350 MG tablet Take 350 mg by mouth 2 (two) times daily.      . cephALEXin (KEFLEX) 500 MG capsule Take 500 mg by mouth 4 (four) times daily.      . cyclobenzaprine (FLEXERIL) 10 MG tablet       . diazepam (VALIUM) 2 MG tablet Take 2 mg by mouth every 6 (six) hours as needed for anxiety.      Marland Kitchen lisinopril (PRINIVIL,ZESTRIL) 20 MG tablet Take 20 mg by mouth  daily.        . ondansetron (ZOFRAN) 4 MG tablet Take 4 mg by mouth every 8 (eight) hours as needed for nausea.       No current facility-administered medications for this visit.    SURGICAL HISTORY:  Past Surgical History  Procedure Laterality Date  . Abdominal hysterectomy  1986    For bleeding and pain  . Eye surgery    . Bladder surgery  2006    Bladder tack  . Throat surgery    . Nose surgery  2007  . Carpal tunnel release    . Neuroma surgery    . Uvulopalatopharyngoplasty    . Mastectomy  2012    rt prophalactic mast-snbx  . Mastectomy modified radical  2012    left-axillary nodes  . Port-a-cath removal      insertion and  . Breast reconstruction Bilateral 09/17/2012    Procedure: BREAST RECONSTRUCTION;  Surgeon: Wayland Denis, DO;  Location: [facility];  Service: Plastics;  Laterality: Bilateral;  BILATERAL BREAST RECONSTRUCTION WITH TISSUE EXPANDERS AND ALLOMED   Health Maintenance  Mammogram: 11/2010 Colonoscopy: 2010 Bone Density Scan: 2010 Pap Smear: 2011 Eye Exam: 2011 Vitamin D Level: with next draw Lipid Panel: 2012  REVIEW OF SYSTEMS:   General: fatigue (-), night sweats (-), fever (-), pain (-) Lymph: palpable nodes (-) HEENT: vision changes (-), mucositis (-), gum bleeding (-), epistaxis (-) Cardiovascular: chest pain (-), palpitations (-) Pulmonary: shortness of breath (-), dyspnea on exertion (-), cough (-), hemoptysis (-) GI:  Early satiety (-), melena (-), dysphagia (-), nausea/vomiting (-), diarrhea (-) GU: dysuria (-), hematuria (-), incontinence (-) Musculoskeletal: joint swelling (-), joint pain (+), back pain (-) Neuro: weakness (-), numbness (-), headache (-), confusion (-) Skin: Rash (-), lesions (-), dryness (-) Psych: depression (-), suicidal/homicidal ideation (-), feeling of hopelessness (-)   PHYSICAL EXAMINATION:  BP 123/77  Pulse 79  Temp(Src) 98.4 F (36.9 C) (Oral)  Resp 20  Ht 5\' 3"  (1.6 m)  Wt 180 lb 12.8  oz (82.01 kg)  BMI 32.04 kg/m2 General: Patient is a well appearing female in no acute distress HEENT: PERRLA, sclerae anicteric no conjunctival pallor, MMM Neck: supple, no palpable adenopathy Lungs: clear to auscultation bilaterally, no wheezes, rhonchi, or rales Cardiovascular: regular rate rhythm, S1, S2, no murmurs, rubs or gallops Abdomen: Soft, non-tender, non-distended, normoactive bowel sounds, no HSM Extremities: warm and well  perfused, no clubbing, cyanosis, or edema Skin: No rashes or lesions Neuro: Non-focal ECOG PERFORMANCE STATUS: 1 - Symptomatic but completely ambulatory Breasts: bilateral mastectomy scars normal, no nodularity, or sign of recurrence.     LABORATORY DATA: Lab Results  Component Value Date   WBC 6.1 11/17/2012   HGB 14.6 11/17/2012   HCT 43.1 11/17/2012   MCV 90.2 11/17/2012   PLT 286 11/17/2012      Chemistry      Component Value Date/Time   NA 141 11/17/2012 0937   NA 139 09/15/2012 1140   K 3.5 11/17/2012 0937   K 3.8 09/15/2012 1140   CL 102 11/17/2012 0937   CL 98 09/15/2012 1140   CO2 29 11/17/2012 0937   CO2 30 09/15/2012 1140   BUN 11.5 11/17/2012 0937   BUN 15 09/15/2012 1140   CREATININE 0.9 11/17/2012 0937   CREATININE 0.97 09/15/2012 1140      Component Value Date/Time   CALCIUM 9.3 11/17/2012 0937   CALCIUM 9.4 09/15/2012 1140   ALKPHOS 80 11/17/2012 0937   ALKPHOS 95 02/08/2012 1003   AST 29 11/17/2012 0937   AST 19 02/08/2012 1003   ALT 28 11/17/2012 0937   ALT 16 02/08/2012 1003   BILITOT 0.52 11/17/2012 0937   BILITOT 0.6 02/08/2012 1003       RADIOGRAPHIC STUDIES:  No results found.  ASSESSMENT: 62 year old female with:  #1 stage II high-grade invasive ductal carcinoma with a positive lymph node patient is status post bilateral mastectomy followed by adjuvant chemotherapy following NSABP B. 49 clinical study. Patient has no evidence of recurrent disease.  #2 patient is now on tamoxifen 20 mg daily overall she seems to be  tolerating it well except for hot flashes and some fatigue.   PLAN: #1 patient will continue tamoxifen 20 mg daily.  #2 she will be seen back in July 2014.  All questions were answered. The patient knows to call the clinic with any problems, questions or concerns. We can certainly see the patient much sooner if necessary.  I spent 25 minutes counseling the patient face to face. The total time spent in the appointment was 30 minutes.  Drue Second, MD Medical/Oncology [facility] [phone] (beeper) [phone] (Office)  11/17/2012, 10:34 AM

## 2012-11-18 ENCOUNTER — Telehealth: Payer: Self-pay | Admitting: Medical Oncology

## 2012-11-18 NOTE — Telephone Encounter (Signed)
Per NP, inquired with patient whether she was taking Vit D. Patient states she does, but every so often. States she is trying to "keep her liver clean and I already take so many pills." Informed patient her Vit D level @ 29 and NP recommends she take 2000 IU daily. Patient states she will take it every other day. Informed patient the importance of keeping Vit D WNL. Patient verbalized understanding. No further questions as this time.

## 2012-11-18 NOTE — Telephone Encounter (Signed)
Message copied by Rexene Edison on Tue Nov 18, 2012  2:43 PM ------      Message from: Laural Golden      Created: Tue Nov 18, 2012  9:58 AM       Patient's vitamin D level is low.  Is she taking Vitamin D? If not please counsel her to take Vitamin d 2000 IU daily.              Thanks, Mardella Layman      ----- Message -----         From: [facility] Interface         Sent: 11/17/2012   9:49 AM           To: Victorino December, MD                   ------

## 2012-11-24 ENCOUNTER — Telehealth: Payer: Self-pay | Admitting: Medical Oncology

## 2012-11-24 NOTE — Telephone Encounter (Signed)
Called pt to f/u with her mssg re having diarrhea. Patient states she has been drinking gatorade and eating yogurt as well as some crackers for the nausea and one time vomiting episode. Reviewed with L. Lyn Hollingshead, NP and encouraged pt to go to Urgent Care. Patient states her daughter had the "stomach bug" and then she took care of her grandson and believes she picked it up from them. Again, encouraged pt to go to urgent care she states that she will should she not feel better and it becomes worse.  Informed pt should her symptoms get worse to go to urgent care and to f/u with her PCP. Patient expressed verbal understanding. Knows to call office with any questions or concerns.

## 2012-11-24 NOTE — Telephone Encounter (Signed)
Pt LVMOM stating she has had "a diarrhea bug that started early Friday morning, all weekend and still going today but not as much. I've taken kaopectate, immodium and nothing is helping. Can Dr Welton Flakes call something in to [facility] for me?"  Attempted to return patients call x 3, phone busy. Will try again.

## 2012-12-29 ENCOUNTER — Telehealth: Payer: Self-pay | Admitting: Oncology

## 2013-01-20 MED ORDER — HYDROMORPHONE HCL PF 1 MG/ML IJ SOLN
INTRAMUSCULAR | Status: AC
  Filled 2013-01-20: qty 1

## 2013-01-20 MED ORDER — OXYCODONE HCL 5 MG PO TABS
ORAL_TABLET | ORAL | Status: AC
  Filled 2013-01-20: qty 1

## 2013-01-20 MED ORDER — CYCLOBENZAPRINE HCL 10 MG PO TABS
ORAL_TABLET | ORAL | Status: AC
  Filled 2013-01-20: qty 1

## 2013-01-22 ENCOUNTER — Encounter (HOSPITAL_BASED_OUTPATIENT_CLINIC_OR_DEPARTMENT_OTHER): Payer: Self-pay | Admitting: *Deleted

## 2013-01-22 NOTE — Progress Notes (Signed)
To come in for bmet-7/9 Pt was here for surgery 2/14-did well

## 2013-01-28 ENCOUNTER — Encounter (HOSPITAL_BASED_OUTPATIENT_CLINIC_OR_DEPARTMENT_OTHER)
Admission: RE | Admit: 2013-01-28 | Discharge: 2013-01-28 | Disposition: A | Discharge status: Home / Self Care | Payer: Medicare HMO | Source: Ambulatory Visit | Attending: Plastic Surgery | Admitting: Plastic Surgery

## 2013-01-28 LAB — BASIC METABOLIC PANEL
CO2: 29 mEq/L (ref 19–32)
Calcium: 9.3 mg/dL (ref 8.4–10.5)
Creatinine, Ser: 0.83 mg/dL (ref 0.50–1.10)
Glucose, Bld: 102 mg/dL — ABNORMAL HIGH (ref 70–99)

## 2013-01-29 ENCOUNTER — Encounter (HOSPITAL_BASED_OUTPATIENT_CLINIC_OR_DEPARTMENT_OTHER): Payer: Self-pay | Admitting: Anesthesiology

## 2013-01-29 ENCOUNTER — Ambulatory Visit (HOSPITAL_BASED_OUTPATIENT_CLINIC_OR_DEPARTMENT_OTHER): Payer: Medicare HMO | Admitting: Anesthesiology

## 2013-01-29 ENCOUNTER — Ambulatory Visit (HOSPITAL_BASED_OUTPATIENT_CLINIC_OR_DEPARTMENT_OTHER)
Admission: RE | Admit: 2013-01-29 | Discharge: 2013-01-29 | Disposition: A | Discharge status: Home / Self Care | Payer: Medicare HMO | Source: Ambulatory Visit | Attending: Plastic Surgery | Admitting: Plastic Surgery

## 2013-01-29 ENCOUNTER — Encounter (HOSPITAL_BASED_OUTPATIENT_CLINIC_OR_DEPARTMENT_OTHER): Payer: Self-pay | Admitting: Plastic Surgery

## 2013-01-29 ENCOUNTER — Encounter (HOSPITAL_BASED_OUTPATIENT_CLINIC_OR_DEPARTMENT_OTHER)
Admission: RE | Disposition: A | Discharge status: Home / Self Care | Payer: Self-pay | Source: Ambulatory Visit | Attending: Plastic Surgery

## 2013-01-29 DIAGNOSIS — Z79818 Long term (current) use of other agents affecting estrogen receptors and estrogen levels: Secondary | ICD-10-CM | POA: Insufficient documentation

## 2013-01-29 DIAGNOSIS — Z9010 Acquired absence of unspecified breast and nipple: Secondary | ICD-10-CM | POA: Insufficient documentation

## 2013-01-29 DIAGNOSIS — L905 Scar conditions and fibrosis of skin: Secondary | ICD-10-CM | POA: Insufficient documentation

## 2013-01-29 DIAGNOSIS — N650 Deformity of reconstructed breast: Secondary | ICD-10-CM | POA: Insufficient documentation

## 2013-01-29 DIAGNOSIS — Z9221 Personal history of antineoplastic chemotherapy: Secondary | ICD-10-CM | POA: Insufficient documentation

## 2013-01-29 DIAGNOSIS — I10 Essential (primary) hypertension: Secondary | ICD-10-CM | POA: Insufficient documentation

## 2013-01-29 DIAGNOSIS — Z853 Personal history of malignant neoplasm of breast: Secondary | ICD-10-CM | POA: Insufficient documentation

## 2013-01-29 HISTORY — PX: LIPOSUCTION: SHX10

## 2013-01-29 SURGERY — REMOVAL, TISSUE EXPANDER, BREAST, BILATERAL, WITH BILATERAL IMPLANT IMPLANT INSERTION
Anesthesia: General | Site: Breast | Laterality: Bilateral | Wound class: Clean

## 2013-01-29 MED ORDER — LIDOCAINE HCL (CARDIAC) 20 MG/ML IV SOLN
INTRAVENOUS | Status: DC | PRN
  Administered 2013-01-29: 100 mg via INTRAVENOUS

## 2013-01-29 MED ORDER — EPHEDRINE SULFATE 50 MG/ML IJ SOLN
INTRAMUSCULAR | Status: DC | PRN
  Administered 2013-01-29: 10 mg via INTRAVENOUS
  Administered 2013-01-29: 15 mg via INTRAVENOUS
  Administered 2013-01-29: 10 mg via INTRAVENOUS

## 2013-01-29 MED ORDER — FENTANYL CITRATE 0.05 MG/ML IJ SOLN: 50.0000 ug | INTRAMUSCULAR | Status: DC | PRN

## 2013-01-29 MED ORDER — SODIUM CHLORIDE 0.9 % IR SOLN
Status: DC | PRN
  Administered 2013-01-29 (×2)

## 2013-01-29 MED ORDER — ONDANSETRON HCL 4 MG/2ML IJ SOLN
INTRAMUSCULAR | Status: DC | PRN
  Administered 2013-01-29: 4 mg via INTRAVENOUS

## 2013-01-29 MED ORDER — SODIUM BICARBONATE 4 % IV SOLN
INTRAVENOUS | Status: DC | PRN
  Administered 2013-01-29 (×2): via INTRAMUSCULAR

## 2013-01-29 MED ORDER — PROPOFOL 10 MG/ML IV BOLUS
INTRAVENOUS | Status: DC | PRN
  Administered 2013-01-29: 150 mg via INTRAVENOUS

## 2013-01-29 MED ORDER — OXYCODONE HCL 5 MG/5ML PO SOLN: 5.0000 mg | Freq: Once | ORAL | Status: DC | PRN

## 2013-01-29 MED ORDER — OXYCODONE HCL 5 MG PO TABS: 5.0000 mg | ORAL_TABLET | Freq: Once | ORAL | Status: DC | PRN

## 2013-01-29 MED ORDER — PHENYLEPHRINE HCL 10 MG/ML IJ SOLN
INTRAMUSCULAR | Status: DC | PRN
  Administered 2013-01-29: 80 ug via INTRAVENOUS

## 2013-01-29 MED ORDER — HYDROMORPHONE HCL PF 1 MG/ML IJ SOLN
0.2500 mg | INTRAMUSCULAR | Status: DC | PRN
  Administered 2013-01-29 (×3): 0.5 mg via INTRAVENOUS

## 2013-01-29 MED ORDER — MIDAZOLAM HCL 2 MG/2ML IJ SOLN: 1.0000 mg | INTRAMUSCULAR | Status: DC | PRN

## 2013-01-29 MED ORDER — CEFAZOLIN SODIUM-DEXTROSE 2-3 GM-% IV SOLR
INTRAVENOUS | Status: DC | PRN
  Administered 2013-01-29: 2 g via INTRAVENOUS

## 2013-01-29 MED ORDER — SODIUM BICARBONATE 4 % IV SOLN: INTRAVENOUS | Status: DC | PRN

## 2013-01-29 MED ORDER — FENTANYL CITRATE 0.05 MG/ML IJ SOLN
INTRAMUSCULAR | Status: DC | PRN
  Administered 2013-01-29: 100 ug via INTRAVENOUS
  Administered 2013-01-29 (×4): 25 ug via INTRAVENOUS

## 2013-01-29 MED ORDER — MIDAZOLAM HCL 5 MG/5ML IJ SOLN
INTRAMUSCULAR | Status: DC | PRN
  Administered 2013-01-29: 2 mg via INTRAVENOUS

## 2013-01-29 MED ORDER — LACTATED RINGERS IV SOLN
INTRAVENOUS | Status: DC
  Administered 2013-01-29 (×3): via INTRAVENOUS

## 2013-01-29 MED ORDER — DEXAMETHASONE SODIUM PHOSPHATE 4 MG/ML IJ SOLN
INTRAMUSCULAR | Status: DC | PRN
  Administered 2013-01-29: 10 mg via INTRAVENOUS

## 2013-01-29 MED ORDER — MEPERIDINE HCL 25 MG/ML IJ SOLN: 6.2500 mg | INTRAMUSCULAR | Status: DC | PRN

## 2013-01-29 MED ORDER — ONDANSETRON HCL 4 MG/2ML IJ SOLN: 4.0000 mg | Freq: Once | INTRAMUSCULAR | Status: DC | PRN

## 2013-01-29 SURGICAL SUPPLY — 81 items
ADH SKN CLS APL DERMABOND .7 (GAUZE/BANDAGES/DRESSINGS) ×2
BAG DECANTER FOR FLEXI CONT (MISCELLANEOUS) ×2 IMPLANT
BANDAGE GAUZE ELAST BULKY 4 IN (GAUZE/BANDAGES/DRESSINGS) ×4 IMPLANT
BINDER BREAST LRG (GAUZE/BANDAGES/DRESSINGS) IMPLANT
BINDER BREAST MEDIUM (GAUZE/BANDAGES/DRESSINGS) IMPLANT
BINDER BREAST XLRG (GAUZE/BANDAGES/DRESSINGS) IMPLANT
BINDER BREAST XXLRG (GAUZE/BANDAGES/DRESSINGS) IMPLANT
BIOPATCH RED 1 DISK 7.0 (GAUZE/BANDAGES/DRESSINGS) IMPLANT
BLADE HEX COATED 2.75 (ELECTRODE) ×2 IMPLANT
BLADE SURG 10 STRL SS (BLADE) IMPLANT
BLADE SURG 15 STRL LF DISP TIS (BLADE) ×2 IMPLANT
BLADE SURG 15 STRL SS (BLADE) ×4
CANISTER LINER 1300 C W/ELBOW (MISCELLANEOUS) ×2 IMPLANT
CANISTER LIPO FAT HARVEST (MISCELLANEOUS) ×2 IMPLANT
CANISTER SUCTION 1200CC (MISCELLANEOUS) ×2 IMPLANT
CANNULA ASPIRATION (CANNULA) ×2 IMPLANT
CHLORAPREP W/TINT 26ML (MISCELLANEOUS) ×2 IMPLANT
CLOTH BEACON ORANGE TIMEOUT ST (SAFETY) ×2 IMPLANT
CORDS BIPOLAR (ELECTRODE) IMPLANT
COVER MAYO STAND STRL (DRAPES) ×2 IMPLANT
COVER TABLE BACK 60X90 (DRAPES) ×2 IMPLANT
DECANTER SPIKE VIAL GLASS SM (MISCELLANEOUS) IMPLANT
DERMABOND ADVANCED (GAUZE/BANDAGES/DRESSINGS) ×2
DERMABOND ADVANCED .7 DNX12 (GAUZE/BANDAGES/DRESSINGS) ×2 IMPLANT
DRAIN CHANNEL 19F RND (DRAIN) IMPLANT
DRAPE LAPAROSCOPIC ABDOMINAL (DRAPES) ×2 IMPLANT
DRSG PAD ABDOMINAL 8X10 ST (GAUZE/BANDAGES/DRESSINGS) ×4 IMPLANT
DRSG TEGADERM 2-3/8X2-3/4 SM (GAUZE/BANDAGES/DRESSINGS) IMPLANT
ELECT BLADE 4.0 EZ CLEAN MEGAD (MISCELLANEOUS) ×2
ELECT REM PT RETURN 9FT ADLT (ELECTROSURGICAL) ×2
ELECTRODE BLDE 4.0 EZ CLN MEGD (MISCELLANEOUS) ×1 IMPLANT
ELECTRODE REM PT RTRN 9FT ADLT (ELECTROSURGICAL) ×1 IMPLANT
EVACUATOR SILICONE 100CC (DRAIN) IMPLANT
FILTER LIPOSUCTION (MISCELLANEOUS) ×2 IMPLANT
GAUZE SPONGE 4X4 12PLY STRL LF (GAUZE/BANDAGES/DRESSINGS) IMPLANT
GLOVE BIO SURGEON STRL SZ 6.5 (GLOVE) ×5 IMPLANT
GLOVE BIO SURGEON STRL SZ8 (GLOVE) ×1 IMPLANT
GLOVE BIOGEL PI IND STRL 8 (GLOVE) IMPLANT
GLOVE BIOGEL PI INDICATOR 8 (GLOVE) ×2
GLOVE SURG SS PI 7.0 STRL IVOR (GLOVE) ×1 IMPLANT
GLOVE SURG SS PI 8.0 STRL IVOR (GLOVE) ×1 IMPLANT
GOWN PREVENTION PLUS XLARGE (GOWN DISPOSABLE) ×4 IMPLANT
GOWN PREVENTION PLUS XXLARGE (GOWN DISPOSABLE) ×2 IMPLANT
IMPL SILICONE 480CC (Breast) IMPLANT
IMPLANT SILICONE 480CC (Breast) ×4 IMPLANT
IV NS 1000ML (IV SOLUTION)
IV NS 1000ML BAXH (IV SOLUTION) IMPLANT
IV NS 500ML (IV SOLUTION)
IV NS 500ML BAXH (IV SOLUTION) ×1 IMPLANT
KIT FILL SYSTEM UNIVERSAL (SET/KITS/TRAYS/PACK) ×2 IMPLANT
NDL HYPO 25X1 1.5 SAFETY (NEEDLE) IMPLANT
NDL SAFETY ECLIPSE 18X1.5 (NEEDLE) ×1 IMPLANT
NDL SPNL 18GX3.5 QUINCKE PK (NEEDLE) ×1 IMPLANT
NEEDLE HYPO 18GX1.5 SHARP (NEEDLE) ×4
NEEDLE HYPO 25X1 1.5 SAFETY (NEEDLE) IMPLANT
NEEDLE SPNL 18GX3.5 QUINCKE PK (NEEDLE) ×2 IMPLANT
NS IRRIG 1000ML POUR BTL (IV SOLUTION) IMPLANT
PACK BASIN DAY SURGERY FS (CUSTOM PROCEDURE TRAY) ×2 IMPLANT
PAD ALCOHOL SWAB (MISCELLANEOUS) ×2 IMPLANT
PENCIL BUTTON HOLSTER BLD 10FT (ELECTRODE) ×2 IMPLANT
PIN SAFETY STERILE (MISCELLANEOUS) IMPLANT
SIZER BREAST GEL REUSE 480CC (SIZER) ×2
SIZER BRST GEL REUSE 480CC (SIZER) IMPLANT
SIZER GENERIC MENTOR (SIZER) ×1 IMPLANT
SLEEVE SCD COMPRESS KNEE MED (MISCELLANEOUS) ×2 IMPLANT
SPONGE LAP 18X18 X RAY DECT (DISPOSABLE) ×4 IMPLANT
SUT MON AB 5-0 PS2 18 (SUTURE) ×4 IMPLANT
SUT PDS AB 2-0 CT2 27 (SUTURE) ×8 IMPLANT
SUT VIC AB 3-0 SH 27 (SUTURE) ×14
SUT VIC AB 3-0 SH 27X BRD (SUTURE) ×2 IMPLANT
SUT VICRYL 4-0 PS2 18IN ABS (SUTURE) ×4 IMPLANT
SYR 20CC LL (SYRINGE) ×4 IMPLANT
SYR 50ML LL SCALE MARK (SYRINGE) IMPLANT
SYR BULB IRRIGATION 50ML (SYRINGE) ×2 IMPLANT
SYR CONTROL 10ML LL (SYRINGE) IMPLANT
SYR TB 1ML LL NO SAFETY (SYRINGE) ×2 IMPLANT
TOWEL OR 17X24 6PK STRL BLUE (TOWEL DISPOSABLE) ×4 IMPLANT
TUBE CONNECTING 20X1/4 (TUBING) ×2 IMPLANT
TUBING SET GRADUATE ASPIR 12FT (MISCELLANEOUS) ×2 IMPLANT
UNDERPAD 30X30 INCONTINENT (UNDERPADS AND DIAPERS) ×4 IMPLANT
YANKAUER SUCT BULB TIP NO VENT (SUCTIONS) ×2 IMPLANT

## 2013-01-29 NOTE — Anesthesia Preprocedure Evaluation (Addendum)
Anesthesia Evaluation  Patient identified by MRN, date of birth, ID band Patient awake    Reviewed: Allergy & Precautions, H&P , NPO status , Patient's Chart, lab work & pertinent test results  History of Anesthesia Complications Negative for: history of anesthetic complications  Airway Mallampati: I TM Distance: >3 FB Neck ROM: Full    Dental   Pulmonary neg pulmonary ROS,          Cardiovascular hypertension, Pt. on medications     Neuro/Psych    GI/Hepatic negative GI ROS, Neg liver ROS,   Endo/Other  negative endocrine ROS  Renal/GU negative Renal ROS     Musculoskeletal negative musculoskeletal ROS (+)   Abdominal   Peds  Hematology negative hematology ROS (+)   Anesthesia Other Findings   Reproductive/Obstetrics negative OB ROS                         Anesthesia Physical Anesthesia Plan  ASA: II  Anesthesia Plan: General   Post-op Pain Management:    Induction: Intravenous  Airway Management Planned: LMA  Additional Equipment:   Intra-op Plan:   Post-operative Plan: Extubation in OR  Informed Consent: I have reviewed the patients History and Physical, chart, labs and discussed the procedure including the risks, benefits and alternatives for the proposed anesthesia with the patient or authorized representative who has indicated his/her understanding and acceptance.     Plan Discussed with: CRNA and Surgeon  Anesthesia Plan Comments:         Anesthesia Quick Evaluation

## 2013-01-29 NOTE — Transfer of Care (Signed)
Immediate Anesthesia Transfer of Care Note  Patient: Krystal [name]  Procedure(s) Performed: Procedure(s): REMOVAL OF BILATERAL TISSUE EXPANDERS WITH PLACEMENT OF BILATERAL BREAST IMPLANTS  (Bilateral) LIPOSUCTION (Bilateral)  Patient Location: PACU  Anesthesia Type:General  Level of Consciousness: awake, alert  and oriented  Airway & Oxygen Therapy: Patient Spontanous Breathing and Patient connected to face mask oxygen  Post-op Assessment: Report given to PACU RN and Post -op Vital signs reviewed and stable  Post vital signs: Reviewed and stable  Complications: No apparent anesthesia complications

## 2013-01-29 NOTE — Brief Op Note (Signed)
01/29/2013  12:30 PM  PATIENT:  Krystal [name]  62 y.o. female  PRE-OPERATIVE DIAGNOSIS:  BREAST CANCER   POST-OPERATIVE DIAGNOSIS:  BREAST CANCER   PROCEDURE:  Procedure(s): REMOVAL OF BILATERAL TISSUE EXPANDERS WITH PLACEMENT OF BILATERAL BREAST IMPLANTS  (Bilateral) LIPOSUCTION (Bilateral)  SURGEON:  Surgeon(s) and Role:    * Claire Sanger, DO - Primary  PHYSICIAN ASSISTANT: none  ASSISTANTS: none   ANESTHESIA:   general  EBL:  Total I/O In: 2000 [I.V.:2000] Out: -   BLOOD ADMINISTERED:none  DRAINS: none   LOCAL MEDICATIONS USED:  LIDOCAINE   SPECIMEN:  Source of Specimen:  capsule, skin bilateral  DISPOSITION OF SPECIMEN:  PATHOLOGY  COUNTS:  YES  TOURNIQUET:  * No tourniquets in log *  DICTATION: .Dragon Dictation  PLAN OF CARE: Discharge to home after PACU  PATIENT DISPOSITION:  PACU - hemodynamically stable.   Delay start of Pharmacological VTE agent (>24hrs) due to surgical blood loss or risk of bleeding: no

## 2013-01-29 NOTE — Anesthesia Procedure Notes (Signed)
Procedure Name: LMA Insertion Date/Time: 01/29/2013 9:06 AM Performed by: Zenia Resides D Pre-anesthesia Checklist: Patient identified, Emergency Drugs available, Suction available and Patient being monitored Patient Re-evaluated:Patient Re-evaluated prior to inductionOxygen Delivery Method: Circle System Utilized Preoxygenation: Pre-oxygenation with 100% oxygen Intubation Type: IV induction Ventilation: Mask ventilation without difficulty LMA: LMA inserted LMA Size: 4.0 Number of attempts: 1 Airway Equipment and Method: bite block Placement Confirmation: positive ETCO2 and CO2 detector Tube secured with: Tape Dental Injury: Teeth and Oropharynx as per pre-operative assessment

## 2013-01-29 NOTE — H&P (Signed)
This document contains confidential information from a [facility] medical record system and may be unauthenticated. Release may be made only with a valid authorization or in accordance with applicable policies of Medical Center or its affiliates. This document must be maintained in a secure manner or discarded/destroyed as required by Medical Center policy or by a confidential means such as shredding.   [name]  01/13/2013 10:30 AM   Office Visit  MRN:  [id number]  Provider: Darrol Poke, PA-C  Department: [facility]  Dept Phone: [phone]    Diagnoses   Acquired absence of bilateral breasts and nipples    -  Primary   V45.71   Vitals - Last Recorded    113/69  73  98.9 F (37.2 C) (Oral)  20  1.575 m (5\' 2" )  83.462 kg (184 lb)       33.65 kg/m2  96%             Subjective:   Patient ID: Krystal [name] is a 62 y.o. female.  HPI The patient is a 62 yrs old wf here for her history and physical for secondary breast reconstruction with silicone implants after bilateral mastectomies and placement of expanders.   History:  She was diagnosed with stage II invasive ductal carcinoma of the left breast and underwent bilateral mastectomies in 2012. She was ER/PR positive, HER-2/neu negative. She underwent adjuvant chemotherapy following NSABP B. 49 clinical trial (Adriamycin and Cytoxan followed by weekly single agent Taxol completed December 2012). She is on adjuvant antiestrogen therapy with tamoxifen 20 mg daily daily. A total of 5 years therapy is planned. Aromasin and Arimidex were discontinued for intolerance. She is 5 feet 3 inches tall and weighs 175 pounds. She was a 38DD bra size. She also had a breast reduction in the past with the typical scars at the inframammary fold and vertical scar.   She is currently 500/350 cc bilaterally. She had Allomed placed.  The following portions of the patient's history were reviewed and updated as appropriate:  allergies, current medications, past family history, past medical history, past social history, past surgical history and problem list.  Review of Systems  Constitutional: Negative.   HENT: Negative.   Eyes: Negative.   Respiratory: Negative.   Cardiovascular: Negative.   Gastrointestinal: Negative.   Endocrine: Negative.   Genitourinary: Negative.   Allergic/Immunologic: Negative.   Neurological: Negative.   Hematological: Negative.   Psychiatric/Behavioral: Negative.      Objective:    Physical Exam  Constitutional: She is oriented to person, place, and time. She appears well-developed and well-nourished. No distress.  HENT:   Head: Normocephalic and atraumatic.  Eyes: Conjunctivae and EOM are normal. Pupils are equal, round, and reactive to light.  Neck: Normal range of motion. Neck supple. No tracheal deviation present.  Cardiovascular: Normal rate, regular rhythm, normal heart sounds and intact distal pulses.  Exam reveals no gallop and no friction rub.    No murmur heard. Pulmonary/Chest: Effort normal and breath sounds normal. No stridor. No respiratory distress. She has no wheezes. She has no rales. She exhibits no tenderness.  Abdominal: Soft. Bowel sounds are normal. She exhibits no distension and no mass. There is no tenderness.  Musculoskeletal: Normal range of motion.  Lymphadenopathy:    She has no cervical adenopathy.  Neurological: She is alert and oriented to person, place, and time.  Skin: Skin is warm and dry.  Psychiatric: She has a normal mood and affect. Her  behavior is normal. Judgment and thought content normal.      Assessment:   1.  Acquired absence of bilateral breasts and nipples       Plan:   Exchange surgery with removal of expanders and placement of bilateral silicone implants. The procedure possible risks, benefits and complications were discussed with the patient and she desires to proceed and consent was obtained.     Medications Ordered This  Encounter      HYDROcodone-acetaminophen (NORCO) 5-325 mg per tablet Take 1 tablet by mouth every 6 (six) hours as needed for 10 days for Pain.    diazepam (VALIUM) 2 MG tablet 30  Take 1 tablet (2 mg total) by mouth every 6 (six) hours as needed for 10 days for Anxiety.     cephalexin (KEFLEX) 500 MG capsule  Take 1 capsule (500 mg total) by mouth 4 times daily. - Oral    promethazine (PHENERGAN) 12.5 MG tablet 30  Take 1 tablet (12.5 mg total) by mouth every 6 (six) hours as needed for 7 days for Nausea.

## 2013-01-29 NOTE — Anesthesia Postprocedure Evaluation (Signed)
Anesthesia Post Note  Patient: Krystal [name]  Procedure(s) Performed: Procedure(s) (LRB): REMOVAL OF BILATERAL TISSUE EXPANDERS WITH PLACEMENT OF BILATERAL BREAST IMPLANTS  (Bilateral) LIPOSUCTION (Bilateral)  Anesthesia type: general  Patient location: PACU  Post pain: Pain level controlled  Post assessment: Patient's Cardiovascular Status Stable  Last Vitals:  Filed Vitals:   01/29/13 1359  BP: 131/69  Pulse: 81  Temp: 36.5 C  Resp: 16    Post vital signs: Reviewed and stable  Level of consciousness: sedated  Complications: No apparent anesthesia complications

## 2013-01-30 ENCOUNTER — Encounter (HOSPITAL_BASED_OUTPATIENT_CLINIC_OR_DEPARTMENT_OTHER): Payer: Self-pay | Admitting: Plastic Surgery

## 2013-02-02 NOTE — Op Note (Signed)
Krystal [name], Krystal [name]             ACCOUNT NO.:  [id number]  MEDICAL RECORD NO.:  [id number]  LOCATION:                               FACILITY:  [facility]  PHYSICIAN:  Wayland Denis, DO      DATE OF BIRTH:  1950-10-14  DATE OF PROCEDURE:  01/29/2013 DATE OF DISCHARGE:  01/29/2013                              OPERATIVE REPORT   PREOPERATIVE DIAGNOSIS:  Breast cancer acquired absence of bilateral breast.  POSTOPERATIVE DIAGNOSIS:  Breast cancer acquired absence of bilateral breast.  PROCEDURE:  Removal of bilateral breast expanders and placement of implant with fat removal via liposuction.  ATTENDING SURGEON:  Wayland Denis, DO  ANESTHESIA:  General.  INDICATION FOR PROCEDURE:  The patient is a 62 year old female who underwent bilateral mastectomies for treatment of breast cancer.  She had reconstruction with expanders.  She presents for removal and placement of the implant.  Risks and complications were discussed and included bleeding, pain, scar, and risk of anesthesia.  DESCRIPTION OF PROCEDURE:  The patient was taken to the operating room, placed on the operating room table in a supine position.  General anesthesia was administered.  Once adequate, a time-out was called, and all information was confirmed to be correct.  She was prepped and draped in the usual sterile fashion.  Tumescent was used to infuse into the lateral axilla thorax region for later removal.  A 15 blade was used to make an elliptical incision at the previous scar site of the superior scar from the mastectomy.  The same exact procedure was done on both sides unless otherwise specified.  The Bovie was used to dissect down to the muscle and then in the inferior direction, so that there would be a flap over the site of the incision.  The Bovie was then used to incise the muscle and the expander was drained of the fluid and removed. The pocket was irrigated with antibiotic solution.  Capsulotomies were  done superiorly, medially, a little bit inferiorly and a capsulectomy laterally in order to reposition the pocket more medially and this was tacked with 2-0 PDS.  The Sizer was then placed and the implant selected.  The mentor high profile 480 gel implant was selected and placed in the pocket.  The muscle layer was then reapproximated and the liposuction machine was used to remove the fat laterally for better symmetry and contour.  The deep layers were then closed with 3-0 Vicryl, followed by a 4-0 Vicryl and a running subcuticular 5-0 Monocryl was used to reapproximate the skin.  Dermabond was applied with ABDs and a breast binder.  The patient tolerated the procedure well.  There were no complications.  She was awoken and taken to the recovery room in stable condition.     Wayland Denis, DO     CS/MEDQ  D:  02/02/2013  T:  02/02/2013  Job:  [id number]

## 2013-02-06 DIAGNOSIS — Z9889 Other specified postprocedural states: Secondary | ICD-10-CM | POA: Insufficient documentation

## 2013-02-20 ENCOUNTER — Other Ambulatory Visit: Payer: Medicare HMO | Admitting: Lab

## 2013-02-20 ENCOUNTER — Ambulatory Visit: Payer: Medicare HMO | Admitting: Oncology

## 2013-03-02 ENCOUNTER — Other Ambulatory Visit (HOSPITAL_BASED_OUTPATIENT_CLINIC_OR_DEPARTMENT_OTHER): Payer: Medicare HMO | Admitting: Lab

## 2013-03-02 ENCOUNTER — Encounter: Payer: Self-pay | Admitting: Oncology

## 2013-03-02 ENCOUNTER — Telehealth: Payer: Self-pay | Admitting: Oncology

## 2013-03-02 ENCOUNTER — Ambulatory Visit (HOSPITAL_BASED_OUTPATIENT_CLINIC_OR_DEPARTMENT_OTHER): Payer: Medicare HMO | Admitting: Oncology

## 2013-03-02 VITALS — BP 111/69 | HR 71 | Temp 98.1°F | Resp 18 | Ht 63.0 in | Wt 177.7 lb

## 2013-03-02 DIAGNOSIS — C773 Secondary and unspecified malignant neoplasm of axilla and upper limb lymph nodes: Secondary | ICD-10-CM

## 2013-03-02 DIAGNOSIS — C50919 Malignant neoplasm of unspecified site of unspecified female breast: Secondary | ICD-10-CM

## 2013-03-02 DIAGNOSIS — C50219 Malignant neoplasm of upper-inner quadrant of unspecified female breast: Secondary | ICD-10-CM

## 2013-03-02 DIAGNOSIS — Z9010 Acquired absence of unspecified breast and nipple: Secondary | ICD-10-CM

## 2013-03-02 LAB — CBC WITH DIFFERENTIAL/PLATELET
BASO%: 1.1 % (ref 0.0–2.0)
EOS%: 6.2 % (ref 0.0–7.0)
LYMPH%: 26.9 % (ref 14.0–49.7)
MCH: 31.5 pg (ref 25.1–34.0)
MCHC: 34.5 g/dL (ref 31.5–36.0)
MCV: 91.3 fL (ref 79.5–101.0)
MONO%: 10.1 % (ref 0.0–14.0)
NEUT#: 3.3 10*3/uL (ref 1.5–6.5)
RBC: 4.47 10*6/uL (ref 3.70–5.45)
RDW: 13.9 % (ref 11.2–14.5)

## 2013-03-02 LAB — COMPREHENSIVE METABOLIC PANEL (CC13)
AST: 24 U/L (ref 5–34)
Albumin: 3.8 g/dL (ref 3.5–5.0)
Alkaline Phosphatase: 61 U/L (ref 40–150)
Potassium: 3.8 mEq/L (ref 3.5–5.1)
Sodium: 140 mEq/L (ref 136–145)
Total Bilirubin: 0.59 mg/dL (ref 0.20–1.20)
Total Protein: 7.2 g/dL (ref 6.4–8.3)

## 2013-03-02 MED ORDER — TAMOXIFEN CITRATE 10 MG PO TABS: 10.0000 mg | ORAL_TABLET | Freq: Every day | ORAL | Status: DC

## 2013-03-02 NOTE — Patient Instructions (Addendum)
Continue tamoxifen 10 mg daily  We will continue to see you every 6 months

## 2013-03-02 NOTE — Progress Notes (Signed)
OFFICE PROGRESS NOTE  CC  [name],[name], MD [address]   [place] [address]  DIAGNOSIS: 62 year old female with stage II invasive ductal carcinoma of the left breast status post mastectomy of the left breast with prophylactic right mastectomy.  PRIOR THERAPY:  #1 patient is status post bilateral mastectomies. She underwent a therapeutic left mastectomy for a stage II high-grade invasive ductal carcinoma that was ER positive PR positive HER-2/neu negative. Patient also had a prophylactic right mastectomy.  #2 patient is now status post adjuvant chemotherapy following NSABP B. 49 clinical trial. She received 4 cycles of dose dense Adriamycin and Cytoxan followed by weekly single agent Taxol completed December 2012.  #3 patient is on adjuvant antiestrogen therapy consisting of tamoxifen 20 mg daily daily. A total of 5 years therapy is planned. Aromasin and Arimidex have been discontinued as patient is not able tolerate these.  #4 patient has begun the process of reconstruction. She now has bilateral implants in place. She will have exchange soon.  CURRENT THERAPY: Tamoxifen 20 mg daily  INTERVAL HISTORY: Krystal [name] 62 y.o. female returns for followup visit today.  Patient is taking tamoxifen. She still is experiencing considerable amount of hot flashes with significant sweating. Otherwise she has no aches or pains. She denies any nausea or vomiting.  Remainder of the 10 point review of systems is negative.   MEDICAL HISTORY: Past Medical History  Diagnosis Date  . Fibromyalgia     muscle weakness and pain  . Depression   . Hypertension     benign  . Hyperlipidemia   . Rotator cuff rupture   . Arthritis     osteo  . Synovitis   . Rosacea   . Cataract   . Plantar fasciitis   . Ulcer   . Blindness of right eye at birth  . Osteoporosis   . Restless leg syndrome   . Anemia   . Edema 07/27/2011  . Cancer     Left breast    ALLERGIES:  has No Known  Allergies.  MEDICATIONS:  Current Outpatient Prescriptions  Medication Sig Dispense Refill  . alendronate (FOSAMAX) 70 MG tablet Take 70 mg by mouth every 7 (seven) days. Take with a full glass of water on an empty stomach.       Marland Kitchen amLODipine (NORVASC) 10 MG tablet       . aspirin 81 MG tablet Take 81 mg by mouth daily.      Marland Kitchen atenolol (TENORMIN) 100 MG tablet Take 100 mg by mouth daily.        . calcium carbonate (OS-CAL) 600 MG TABS Take 600 mg by mouth 2 (two) times daily with a meal.      . carisoprodol (SOMA) 350 MG tablet Take 350 mg by mouth 2 (two) times daily.      . cholecalciferol (VITAMIN D) 1000 UNITS tablet Take 1,000 Units by mouth daily.      . cyclobenzaprine (FLEXERIL) 10 MG tablet       . furosemide (LASIX) 20 MG tablet Take 40 mg by mouth as needed.       Marland Kitchen lisinopril-hydrochlorothiazide (PRINZIDE,ZESTORETIC) 20-25 MG per tablet       . potassium chloride SA (K-DUR,KLOR-CON) 20 MEQ tablet Take 20 mEq by mouth 2 (two) times daily.      . pregabalin (LYRICA) 300 MG capsule Take 300 mg by mouth 2 (two) times daily.        . ranitidine (ZANTAC) 150 MG tablet Take  150 mg by mouth 2 (two) times daily.        Marland Kitchen rOPINIRole (REQUIP) 0.5 MG tablet Take 0.5 mg by mouth every evening.      . tamoxifen (NOLVADEX) 10 MG tablet Take 1 tablet (10 mg total) by mouth daily.  90 tablet  0  . zolpidem (AMBIEN CR) 12.5 MG CR tablet Take 10 mg by mouth at bedtime as needed. Pt takes 10mg  tablet at bedtime        No current facility-administered medications for this visit.    SURGICAL HISTORY:  Past Surgical History  Procedure Laterality Date  . Abdominal hysterectomy  1986    For bleeding and pain  . Eye surgery    . Bladder surgery  2006    Bladder tack  . Throat surgery    . Nose surgery  2007  . Carpal tunnel release    . Neuroma surgery    . Uvulopalatopharyngoplasty    . Mastectomy  2012    rt prophalactic mast-snbx  . Mastectomy modified radical  2012    left-axillary  nodes  . Port-a-cath removal      insertion and  . Breast reconstruction Bilateral 09/17/2012    Procedure: BREAST RECONSTRUCTION;  Surgeon: Wayland Denis, DO;  Location: [facility];  Service: Plastics;  Laterality: Bilateral;  BILATERAL BREAST RECONSTRUCTION WITH TISSUE EXPANDERS AND ALLOMED  . Liposuction Bilateral 01/29/2013    Procedure: LIPOSUCTION;  Surgeon: Wayland Denis, DO;  Location: [facility];  Service: Plastics;  Laterality: Bilateral;   Health Maintenance  Mammogram: 11/2010 Colonoscopy: 2010 Bone Density Scan: 2010 Pap Smear: 2011 Eye Exam: 2011 Vitamin D Level: with next draw Lipid Panel: 2012  REVIEW OF SYSTEMS:   General: fatigue (-), night sweats (-), fever (-), pain (-) Lymph: palpable nodes (-) HEENT: vision changes (-), mucositis (-), gum bleeding (-), epistaxis (-) Cardiovascular: chest pain (-), palpitations (-) Pulmonary: shortness of breath (-), dyspnea on exertion (-), cough (-), hemoptysis (-) GI:  Early satiety (-), melena (-), dysphagia (-), nausea/vomiting (-), diarrhea (-) GU: dysuria (-), hematuria (-), incontinence (-) Musculoskeletal: joint swelling (-), joint pain (+), back pain (-) Neuro: weakness (-), numbness (-), headache (-), confusion (-) Skin: Rash (-), lesions (-), dryness (-) Psych: depression (-), suicidal/homicidal ideation (-), feeling of hopelessness (-)   PHYSICAL EXAMINATION:  BP 111/69  Pulse 71  Temp(Src) 98.1 F (36.7 C) (Oral)  Resp 18  Ht 5\' 3"  (1.6 m)  Wt 177 lb 11.2 oz (80.604 kg)  BMI 31.49 kg/m2  SpO2 98% General: Patient is a well appearing female in no acute distress HEENT: PERRLA, sclerae anicteric no conjunctival pallor, MMM Neck: supple, no palpable adenopathy Lungs: clear to auscultation bilaterally, no wheezes, rhonchi, or rales Cardiovascular: regular rate rhythm, S1, S2, no murmurs, rubs or gallops Abdomen: Soft, non-tender, non-distended, normoactive bowel sounds, no  HSM Extremities: warm and well perfused, no clubbing, cyanosis, or edema Skin: No rashes or lesions Neuro: Non-focal ECOG PERFORMANCE STATUS: 1 - Symptomatic but completely ambulatory Breasts: bilateral mastectomy scars normal, no nodularity, or sign of recurrence.     LABORATORY DATA: Lab Results  Component Value Date   WBC 6.0 03/02/2013   HGB 14.1 03/02/2013   HCT 40.9 03/02/2013   MCV 91.3 03/02/2013   PLT 227 03/02/2013      Chemistry      Component Value Date/Time   NA 140 03/02/2013 1029   NA 140 01/28/2013 1500   K 3.8 03/02/2013 1029  K 3.5 01/28/2013 1500   CL 101 01/28/2013 1500   CL 102 11/17/2012 0937   CO2 27 03/02/2013 1029   CO2 29 01/28/2013 1500   BUN 16.7 03/02/2013 1029   BUN 11 01/28/2013 1500   CREATININE 1.0 03/02/2013 1029   CREATININE 0.83 01/28/2013 1500      Component Value Date/Time   CALCIUM 9.1 03/02/2013 1029   CALCIUM 9.3 01/28/2013 1500   ALKPHOS 61 03/02/2013 1029   ALKPHOS 95 02/08/2012 1003   AST 24 03/02/2013 1029   AST 19 02/08/2012 1003   ALT 23 03/02/2013 1029   ALT 16 02/08/2012 1003   BILITOT 0.59 03/02/2013 1029   BILITOT 0.6 02/08/2012 1003       RADIOGRAPHIC STUDIES:  No results found.  ASSESSMENT: 62 year old female with:  #1 stage II high-grade invasive ductal carcinoma with a positive lymph node patient is status post bilateral mastectomy followed by adjuvant chemotherapy following NSABP B. 49 clinical study. Patient has no evidence of recurrent disease.  #2 patient is now on tamoxifen 10 mg daily overall she seems to be tolerating it well except for hot flashes and some fatigue.   PLAN: #1 Continue tamoxifen 10 mg daily, she is tolerating this dose well  #2 I will se her back in 6 months for follow up  All questions were answered. The patient knows to call the clinic with any problems, questions or concerns. We can certainly see the patient much sooner if necessary.  I spent 25 minutes counseling the patient face to face. The total  time spent in the appointment was 30 minutes.  Drue Second, MD Medical/Oncology [facility] [phone] (beeper) [phone] (Office)  03/02/2013, 11:54 AM

## 2013-03-04 ENCOUNTER — Other Ambulatory Visit: Payer: Medicare HMO | Admitting: Lab

## 2013-03-04 ENCOUNTER — Ambulatory Visit: Payer: Medicare HMO | Admitting: Oncology

## 2013-05-29 ENCOUNTER — Encounter: Payer: Self-pay | Admitting: Plastic Surgery

## 2013-05-29 ENCOUNTER — Encounter (HOSPITAL_BASED_OUTPATIENT_CLINIC_OR_DEPARTMENT_OTHER): Payer: Self-pay | Admitting: *Deleted

## 2013-05-29 ENCOUNTER — Other Ambulatory Visit: Payer: Self-pay | Admitting: Plastic Surgery

## 2013-05-29 DIAGNOSIS — T8544XD Capsular contracture of breast implant, subsequent encounter: Secondary | ICD-10-CM

## 2013-05-29 NOTE — H&P (Signed)
This document contains confidential information from a [facility] medical record system and may be unauthenticated. Release may be made only with a valid authorization or in accordance with applicable policies of Medical Center or its affiliates. This document must be maintained in a secure manner or discarded/destroyed as required by Medical Center policy or by a confidential means such as shredding.     Wynetta Seith  05/19/2013 8:00 AM   Office Visit  MRN:  [id number]  Department: Plastic Surgery  Dept Phone: [phone]  Description: Female DOB: 1951/04/08  Provider: Fanny Bien Rayburn, PA-C   Diagnoses    Status post bilateral breast reconstruction       V43.82    Breast asymmetry following reconstructive surgery       612.1     Vitals - Last Recorded    112/65  75  98.6 F (37 C)  1.575 m (5\' 2" )  83.462 kg (184 lb)  33.65 kg/m2    Subjective:    Patient ID: Krystal [name] is a 62 y.o. female.  HPI The patient is a 62 yrs old wf here for history and physical for right breast revision surgery following secondary breast reconstruction with silicone implants on 01/29/2013 after bilateral mastectomies and placement of expanders. The right breast has fallen and moved lateral.   Originally, she was diagnosed with stage II invasive ductal carcinoma of the left breast and underwent bilateral mastectomies in 2012. She was ER/PR positive, HER-2/neu negative. She underwent adjuvant chemotherapy (Adriamycin and Cytoxan followed by weekly single agent Taxol completed December 2012). She is on adjuvant antiestrogen therapy with tamoxifen 20 mg daily daily. A total of 5 years therapy is planned. Aromasin and Arimidex were discontinued for intolerance. She is 5 feet 3 inches tall and weighs 175 pounds. She was a 38DD bra size. She also had a breast reduction in the past with the typical scars at the inframammary fold and vertical scar.  We used Allomed for the ADM.   She underwent  placement of bilateral silicone implants and lipografting. She is very pleased with the left breast, but notes the right breast has fallen and moved lateral and this is noted on exam.  She does not desire nipple tattooing at this time.  The following portions of the patient's history were reviewed and updated as appropriate: allergies, current medications, past family history, past medical history, past social history, past surgical history and problem list.  Review of Systems  All other systems reviewed and are negative.     Objective:   Physical Exam  Constitutional: She is oriented to person, place, and time. She appears well-developed and well-nourished. No distress.  HENT:   Head: Normocephalic and atraumatic.   Nose: Nose normal.   Mouth/Throat: Oropharynx is clear and moist.  Eyes: Conjunctivae and EOM are normal. Pupils are equal, round, and reactive to light.  Neck: Normal range of motion. Neck supple. No tracheal deviation present. No thyromegaly present.  Cardiovascular: Normal rate, regular rhythm, normal heart sounds and intact distal pulses.   Pulmonary/Chest: Effort normal and breath sounds normal. No stridor. No respiratory distress. She has no wheezes. She has no rales. She exhibits no tenderness.  Some mild tenderness over the right anterior chest wall and laterally over the right breast border to palpation  Abdominal: Soft. Bowel sounds are normal. She exhibits no distension and no mass. There is no tenderness.  Musculoskeletal: Normal range of motion. She exhibits no edema and no tenderness.  Neurological: She  is alert and oriented to person, place, and time. No cranial nerve deficit. She exhibits normal muscle tone. Coordination normal.  Skin: Skin is warm and dry. No rash noted. No erythema.  Psychiatric: She has a normal mood and affect. Her behavior is normal. Judgment and thought content normal.      Assessment:     1.  Status post bilateral breast reconstruction     2.  Breast asymmetry following reconstructive surgery        Plan:   Plan right breast revision surgery with repositioning of the implant more medially with release of the capsule laterally and medially. The procedure possible risks, benefits and complications were discussed with the patient and she desires to proceed and consent was obtained. She currently has 480 cc silicone implants in place.    Medications Ordered This Encounter     HYDROcodone-acetaminophen (NORCO) 5-325 mg per Take 1 tablet by mouth every 6 (six) hours as needed for up to 10 days for Pain.    diazepam (VALIUM) 2 MG tablet Take 1 tablet (2 mg total) by mouth every 6 (six) hours as needed for up to 10 days for Anxiety.    cephalexin (KEFLEX) 500 MG capsule Take 1 capsule (500 mg total) by mouth 4 times daily.

## 2013-05-29 NOTE — Progress Notes (Signed)
Has been here in past=to come in for bmet

## 2013-06-02 ENCOUNTER — Encounter (HOSPITAL_BASED_OUTPATIENT_CLINIC_OR_DEPARTMENT_OTHER)
Admission: RE | Admit: 2013-06-02 | Discharge: 2013-06-02 | Disposition: A | Discharge status: Home / Self Care | Payer: Medicare HMO | Source: Ambulatory Visit | Attending: Plastic Surgery | Admitting: Plastic Surgery

## 2013-06-02 LAB — BASIC METABOLIC PANEL
BUN: 12 mg/dL (ref 6–23)
Creatinine, Ser: 0.9 mg/dL (ref 0.50–1.10)
GFR calc Af Amer: 79 mL/min — ABNORMAL LOW (ref >90)
GFR calc non Af Amer: 68 mL/min — ABNORMAL LOW (ref >90)
Glucose, Bld: 156 mg/dL — ABNORMAL HIGH (ref 70–99)

## 2013-06-03 ENCOUNTER — Ambulatory Visit (HOSPITAL_BASED_OUTPATIENT_CLINIC_OR_DEPARTMENT_OTHER)
Admission: RE | Admit: 2013-06-03 | Discharge: 2013-06-03 | Disposition: A | Discharge status: Home / Self Care | Payer: Medicare HMO | Source: Ambulatory Visit | Attending: Plastic Surgery | Admitting: Plastic Surgery

## 2013-06-03 ENCOUNTER — Encounter (HOSPITAL_BASED_OUTPATIENT_CLINIC_OR_DEPARTMENT_OTHER)
Admission: RE | Disposition: A | Discharge status: Home / Self Care | Payer: Self-pay | Source: Ambulatory Visit | Attending: Plastic Surgery

## 2013-06-03 ENCOUNTER — Ambulatory Visit (HOSPITAL_BASED_OUTPATIENT_CLINIC_OR_DEPARTMENT_OTHER): Payer: Medicare HMO | Admitting: Anesthesiology

## 2013-06-03 ENCOUNTER — Encounter (HOSPITAL_BASED_OUTPATIENT_CLINIC_OR_DEPARTMENT_OTHER): Payer: Self-pay | Admitting: *Deleted

## 2013-06-03 ENCOUNTER — Encounter (HOSPITAL_BASED_OUTPATIENT_CLINIC_OR_DEPARTMENT_OTHER): Payer: Medicare HMO | Admitting: Anesthesiology

## 2013-06-03 DIAGNOSIS — Z853 Personal history of malignant neoplasm of breast: Secondary | ICD-10-CM | POA: Insufficient documentation

## 2013-06-03 DIAGNOSIS — N651 Disproportion of reconstructed breast: Secondary | ICD-10-CM | POA: Insufficient documentation

## 2013-06-03 DIAGNOSIS — T8544XD Capsular contracture of breast implant, subsequent encounter: Secondary | ICD-10-CM

## 2013-06-03 DIAGNOSIS — Z9010 Acquired absence of unspecified breast and nipple: Secondary | ICD-10-CM | POA: Insufficient documentation

## 2013-06-03 HISTORY — PX: LIPOSUCTION: SHX10

## 2013-06-03 HISTORY — PX: BREAST RECONSTRUCTION: SHX9

## 2013-06-03 LAB — POCT HEMOGLOBIN-HEMACUE: Hemoglobin: 15 g/dL (ref 12.0–15.0)

## 2013-06-03 SURGERY — RECONSTRUCTION, BREAST
Anesthesia: General | Site: Breast | Laterality: Right | Wound class: Clean

## 2013-06-03 MED ORDER — OXYCODONE HCL 5 MG/5ML PO SOLN: 5.0000 mg | Freq: Once | ORAL | Status: DC | PRN

## 2013-06-03 MED ORDER — LIDOCAINE-EPINEPHRINE 1 %-1:100000 IJ SOLN
INTRAMUSCULAR | Status: AC
  Filled 2013-06-03: qty 3

## 2013-06-03 MED ORDER — PROPOFOL 10 MG/ML IV BOLUS
INTRAVENOUS | Status: AC
  Filled 2013-06-03: qty 20

## 2013-06-03 MED ORDER — HYDROMORPHONE HCL PF 1 MG/ML IJ SOLN
INTRAMUSCULAR | Status: AC
  Filled 2013-06-03: qty 1

## 2013-06-03 MED ORDER — EPHEDRINE SULFATE 50 MG/ML IJ SOLN
INTRAMUSCULAR | Status: DC | PRN
  Administered 2013-06-03 (×2): 10 mg via INTRAVENOUS

## 2013-06-03 MED ORDER — MIDAZOLAM HCL 2 MG/2ML IJ SOLN
INTRAMUSCULAR | Status: AC
  Filled 2013-06-03: qty 2

## 2013-06-03 MED ORDER — SODIUM CHLORIDE 0.9 % IJ SOLN
INTRAMUSCULAR | Status: AC
  Filled 2013-06-03: qty 60

## 2013-06-03 MED ORDER — BUPIVACAINE HCL (PF) 0.25 % IJ SOLN
INTRAMUSCULAR | Status: AC
  Filled 2013-06-03: qty 30

## 2013-06-03 MED ORDER — DEXAMETHASONE SODIUM PHOSPHATE 4 MG/ML IJ SOLN
INTRAMUSCULAR | Status: DC | PRN
  Administered 2013-06-03: 5 mg via INTRAVENOUS

## 2013-06-03 MED ORDER — MIDAZOLAM HCL 2 MG/2ML IJ SOLN: 1.0000 mg | INTRAMUSCULAR | Status: DC | PRN

## 2013-06-03 MED ORDER — OXYCODONE HCL 5 MG PO TABS: 5.0000 mg | ORAL_TABLET | Freq: Once | ORAL | Status: DC | PRN

## 2013-06-03 MED ORDER — EPINEPHRINE HCL 1 MG/ML IJ SOLN
INTRAMUSCULAR | Status: AC
  Filled 2013-06-03: qty 1

## 2013-06-03 MED ORDER — ONDANSETRON HCL 4 MG/2ML IJ SOLN: 4.0000 mg | Freq: Four times a day (QID) | INTRAMUSCULAR | Status: DC | PRN

## 2013-06-03 MED ORDER — SUFENTANIL CITRATE 50 MCG/ML IV SOLN
INTRAVENOUS | Status: AC
  Filled 2013-06-03: qty 1

## 2013-06-03 MED ORDER — FENTANYL CITRATE 0.05 MG/ML IJ SOLN: 50.0000 ug | INTRAMUSCULAR | Status: DC | PRN

## 2013-06-03 MED ORDER — BUPIVACAINE-EPINEPHRINE PF 0.25-1:200000 % IJ SOLN
INTRAMUSCULAR | Status: AC
  Filled 2013-06-03: qty 30

## 2013-06-03 MED ORDER — PROPOFOL 10 MG/ML IV BOLUS
INTRAVENOUS | Status: DC | PRN
  Administered 2013-06-03: 180 mg via INTRAVENOUS

## 2013-06-03 MED ORDER — HYDROMORPHONE HCL PF 1 MG/ML IJ SOLN
0.2500 mg | INTRAMUSCULAR | Status: DC | PRN
  Administered 2013-06-03 (×2): 0.5 mg via INTRAVENOUS

## 2013-06-03 MED ORDER — CEFAZOLIN SODIUM-DEXTROSE 2-3 GM-% IV SOLR
2.0000 g | INTRAVENOUS | Status: AC
  Administered 2013-06-03: 2 g via INTRAVENOUS

## 2013-06-03 MED ORDER — LIDOCAINE HCL (PF) 1 % IJ SOLN
INTRAMUSCULAR | Status: AC
  Filled 2013-06-03: qty 30

## 2013-06-03 MED ORDER — MIDAZOLAM HCL 5 MG/5ML IJ SOLN
INTRAMUSCULAR | Status: DC | PRN
  Administered 2013-06-03: 2 mg via INTRAVENOUS

## 2013-06-03 MED ORDER — SODIUM CHLORIDE 0.9 % IR SOLN
Status: DC | PRN
  Administered 2013-06-03: 15:00:00

## 2013-06-03 MED ORDER — FENTANYL CITRATE 0.05 MG/ML IJ SOLN
INTRAMUSCULAR | Status: AC
  Filled 2013-06-03: qty 4

## 2013-06-03 MED ORDER — LACTATED RINGERS IV SOLN
INTRAVENOUS | Status: DC
  Administered 2013-06-03 (×2): via INTRAVENOUS

## 2013-06-03 MED ORDER — LIDOCAINE-EPINEPHRINE 1 %-1:100000 IJ SOLN
INTRAMUSCULAR | Status: AC
  Filled 2013-06-03: qty 1

## 2013-06-03 MED ORDER — LIDOCAINE HCL (CARDIAC) 20 MG/ML IV SOLN
INTRAVENOUS | Status: DC | PRN
  Administered 2013-06-03: 80 mg via INTRAVENOUS

## 2013-06-03 MED ORDER — LIDOCAINE HCL 1 % IJ SOLN
INTRAVENOUS | Status: DC | PRN
  Administered 2013-06-03: 15:00:00

## 2013-06-03 MED ORDER — ONDANSETRON HCL 4 MG/2ML IJ SOLN
INTRAMUSCULAR | Status: DC | PRN
  Administered 2013-06-03: 4 mg via INTRAVENOUS

## 2013-06-03 MED ORDER — CEFAZOLIN SODIUM-DEXTROSE 2-3 GM-% IV SOLR
INTRAVENOUS | Status: AC
  Filled 2013-06-03: qty 50

## 2013-06-03 MED ORDER — FENTANYL CITRATE 0.05 MG/ML IJ SOLN
INTRAMUSCULAR | Status: AC
  Filled 2013-06-03: qty 6

## 2013-06-03 MED ORDER — FENTANYL CITRATE 0.05 MG/ML IJ SOLN
INTRAMUSCULAR | Status: DC | PRN
  Administered 2013-06-03 (×2): 50 ug via INTRAVENOUS
  Administered 2013-06-03: 100 ug via INTRAVENOUS

## 2013-06-03 MED ORDER — SUCCINYLCHOLINE CHLORIDE 20 MG/ML IJ SOLN
INTRAMUSCULAR | Status: AC
  Filled 2013-06-03: qty 1

## 2013-06-03 SURGICAL SUPPLY — 82 items
ADH SKN CLS APL DERMABOND .7 (GAUZE/BANDAGES/DRESSINGS) ×2
BAG DECANTER FOR FLEXI CONT (MISCELLANEOUS) ×3 IMPLANT
BANDAGE GAUZE ELAST BULKY 4 IN (GAUZE/BANDAGES/DRESSINGS) ×6 IMPLANT
BINDER BREAST LRG (GAUZE/BANDAGES/DRESSINGS) IMPLANT
BINDER BREAST MEDIUM (GAUZE/BANDAGES/DRESSINGS) IMPLANT
BINDER BREAST XLRG (GAUZE/BANDAGES/DRESSINGS) ×2 IMPLANT
BINDER BREAST XXLRG (GAUZE/BANDAGES/DRESSINGS) IMPLANT
BIOPATCH RED 1 DISK 7.0 (GAUZE/BANDAGES/DRESSINGS) IMPLANT
BLADE HEX COATED 2.75 (ELECTRODE) ×3 IMPLANT
BLADE SURG 10 STRL SS (BLADE) IMPLANT
BLADE SURG 15 STRL LF DISP TIS (BLADE) ×2 IMPLANT
BLADE SURG 15 STRL SS (BLADE) ×3
CANISTER LINER 1300 C W/ELBOW (MISCELLANEOUS) ×1 IMPLANT
CANISTER LIPO FAT HARVEST (MISCELLANEOUS) ×1 IMPLANT
CANISTER SUCT 1200ML W/VALVE (MISCELLANEOUS) ×3 IMPLANT
CANNULA ASPIRATION (CANNULA) ×1 IMPLANT
CHLORAPREP W/TINT 26ML (MISCELLANEOUS) ×3 IMPLANT
COVER MAYO STAND STRL (DRAPES) ×3 IMPLANT
COVER TABLE BACK 60X90 (DRAPES) ×3 IMPLANT
DECANTER SPIKE VIAL GLASS SM (MISCELLANEOUS) IMPLANT
DERMABOND ADVANCED (GAUZE/BANDAGES/DRESSINGS) ×1
DERMABOND ADVANCED .7 DNX12 (GAUZE/BANDAGES/DRESSINGS) ×3 IMPLANT
DRAIN CHANNEL 19F RND (DRAIN) IMPLANT
DRAPE LAPAROSCOPIC ABDOMINAL (DRAPES) ×3 IMPLANT
DRSG PAD ABDOMINAL 8X10 ST (GAUZE/BANDAGES/DRESSINGS) ×4 IMPLANT
DRSG TEGADERM 2-3/8X2-3/4 SM (GAUZE/BANDAGES/DRESSINGS) IMPLANT
ELECT BLADE 4.0 EZ CLEAN MEGAD (MISCELLANEOUS) ×3
ELECT BLADE 6.5 .24CM SHAFT (ELECTRODE) IMPLANT
ELECT REM PT RETURN 9FT ADLT (ELECTROSURGICAL) ×3
ELECTRODE BLDE 4.0 EZ CLN MEGD (MISCELLANEOUS) ×2 IMPLANT
ELECTRODE REM PT RTRN 9FT ADLT (ELECTROSURGICAL) ×2 IMPLANT
EVACUATOR SILICONE 100CC (DRAIN) IMPLANT
FILTER LIPOSUCTION (MISCELLANEOUS) ×1 IMPLANT
GAUZE SPONGE 4X4 12PLY STRL LF (GAUZE/BANDAGES/DRESSINGS) IMPLANT
GLOVE BIO SURGEON STRL SZ 6.5 (GLOVE) ×10 IMPLANT
GLOVE BIOGEL M 7.0 STRL (GLOVE) ×2 IMPLANT
GLOVE BIOGEL PI IND STRL 7.0 (GLOVE) ×1 IMPLANT
GLOVE BIOGEL PI INDICATOR 7.0 (GLOVE) ×1
GOWN PREVENTION PLUS XLARGE (GOWN DISPOSABLE) ×9 IMPLANT
IV NS 1000ML (IV SOLUTION)
IV NS 1000ML BAXH (IV SOLUTION) IMPLANT
IV NS 500ML (IV SOLUTION) ×3
IV NS 500ML BAXH (IV SOLUTION) ×2 IMPLANT
KIT FILL SYSTEM UNIVERSAL (SET/KITS/TRAYS/PACK) ×3 IMPLANT
NDL 21 GA WING INFUSION (NEEDLE) IMPLANT
NDL HYPO 25X1 1.5 SAFETY (NEEDLE) IMPLANT
NDL SAFETY ECLIPSE 18X1.5 (NEEDLE) ×2 IMPLANT
NDL SPNL 18GX3.5 QUINCKE PK (NEEDLE) ×1 IMPLANT
NDL SPNL 22GX3.5 QUINCKE BK (NEEDLE) IMPLANT
NEEDLE 21 GA WING INFUSION (NEEDLE) IMPLANT
NEEDLE HYPO 18GX1.5 SHARP (NEEDLE) ×3
NEEDLE HYPO 25X1 1.5 SAFETY (NEEDLE) IMPLANT
NEEDLE SPNL 18GX3.5 QUINCKE PK (NEEDLE) ×3 IMPLANT
NEEDLE SPNL 22GX3.5 QUINCKE BK (NEEDLE) IMPLANT
NS IRRIG 1000ML POUR BTL (IV SOLUTION) IMPLANT
PACK BASIN DAY SURGERY FS (CUSTOM PROCEDURE TRAY) ×3 IMPLANT
PAD ALCOHOL SWAB (MISCELLANEOUS) ×3 IMPLANT
PENCIL BUTTON HOLSTER BLD 10FT (ELECTRODE) ×3 IMPLANT
PIN SAFETY STERILE (MISCELLANEOUS) IMPLANT
SLEEVE SCD COMPRESS KNEE MED (MISCELLANEOUS) ×3 IMPLANT
SPONGE LAP 18X18 X RAY DECT (DISPOSABLE) ×6 IMPLANT
SUT MNCRL AB 3-0 PS2 18 (SUTURE) ×2 IMPLANT
SUT MNCRL AB 4-0 PS2 18 (SUTURE) ×2 IMPLANT
SUT MON AB 5-0 PS2 18 (SUTURE) ×4 IMPLANT
SUT PDS 3-0 CT2 (SUTURE)
SUT PDS AB 2-0 CT2 27 (SUTURE) ×6 IMPLANT
SUT PDS II 3-0 CT2 27 ABS (SUTURE) IMPLANT
SUT SILK 3 0 PS 1 (SUTURE) IMPLANT
SUT VIC AB 3-0 SH 27 (SUTURE) ×3
SUT VIC AB 3-0 SH 27X BRD (SUTURE) ×2 IMPLANT
SUT VICRYL 4-0 PS2 18IN ABS (SUTURE) ×1 IMPLANT
SYR 20CC LL (SYRINGE) IMPLANT
SYR 50ML LL SCALE MARK (SYRINGE) ×3 IMPLANT
SYR BULB IRRIGATION 50ML (SYRINGE) ×3 IMPLANT
SYR CONTROL 10ML LL (SYRINGE) IMPLANT
SYR TB 1ML LL NO SAFETY (SYRINGE) ×3 IMPLANT
SYRINGE TOOMEY DISP (SYRINGE) IMPLANT
TOWEL OR 17X24 6PK STRL BLUE (TOWEL DISPOSABLE) ×6 IMPLANT
TUBE CONNECTING 20X1/4 (TUBING) ×3 IMPLANT
TUBING SET GRADUATE ASPIR 12FT (MISCELLANEOUS) ×3 IMPLANT
UNDERPAD 30X30 INCONTINENT (UNDERPADS AND DIAPERS) ×4 IMPLANT
YANKAUER SUCT BULB TIP NO VENT (SUCTIONS) ×3 IMPLANT

## 2013-06-03 NOTE — Interval H&P Note (Signed)
History and Physical Interval Note:  06/03/2013 11:48 AM  Krystal [name]  has presented today for surgery, with the diagnosis of STATUS POST BILATERAL BREAST RECONSTRUCTION  The various methods of treatment have been discussed with the patient and family. After consideration of risks, benefits and other options for treatment, the patient has consented to  Procedure(s): RIGHT BREAST CAPSULE CONSTRACTURE (Right) LIPOSUCTION WITH LIPOFILLING (N/A) as a surgical intervention .  The patient's history has been reviewed, patient examined, no change in status, stable for surgery.  I have reviewed the patient's chart and labs.  Questions were answered to the patient's satisfaction.     SANGER,CLAIRE

## 2013-06-03 NOTE — Anesthesia Preprocedure Evaluation (Signed)
Anesthesia Evaluation  Patient identified by MRN, date of birth, ID band Patient awake    Reviewed: Allergy & Precautions, H&P , NPO status , Patient's Chart, lab work & pertinent test results  Airway Mallampati: II  Neck ROM: full    Dental   Pulmonary          Cardiovascular hypertension,     Neuro/Psych Depression  Neuromuscular disease    GI/Hepatic   Endo/Other    Renal/GU      Musculoskeletal  (+) Arthritis -, Fibromyalgia -  Abdominal   Peds  Hematology  (+) anemia ,   Anesthesia Other Findings   Reproductive/Obstetrics                           Anesthesia Physical Anesthesia Plan  ASA: II  Anesthesia Plan: General   Post-op Pain Management:    Induction: Intravenous  Airway Management Planned: LMA  Additional Equipment:   Intra-op Plan:   Post-operative Plan:   Informed Consent: I have reviewed the patients History and Physical, chart, labs and discussed the procedure including the risks, benefits and alternatives for the proposed anesthesia with the patient or authorized representative who has indicated his/her understanding and acceptance.     Plan Discussed with: CRNA, Anesthesiologist and Surgeon  Anesthesia Plan Comments:         Anesthesia Quick Evaluation

## 2013-06-03 NOTE — Brief Op Note (Signed)
06/03/2013  2:46 PM  PATIENT:  Tora Perches  62 y.o. female  PRE-OPERATIVE DIAGNOSIS:  STATUS POST BILATERAL BREAST RECONSTRUCTION  POST-OPERATIVE DIAGNOSIS:  STATUS POST BILATERAL BREAST RECONSTRUCTION WITH POST SURGICAL ASYMMETRY.  PROCEDURE:  Procedure(s): RIGHT BREAST CAPSULE CONSTRACTURE (Right) LIPOSUCTION (Right)  SURGEON:  Surgeon(s) and Role:    * Mishael Haran Sanger, DO - Primary  PHYSICIAN ASSISTANT: Lazaro Arms, PA  ASSISTANTS: none   ANESTHESIA:   general  EBL:  Total I/O In: 1600 [I.V.:1600] Out: -   BLOOD ADMINISTERED:none  DRAINS: none   LOCAL MEDICATIONS USED:  LIDOCAINE   SPECIMEN:  No Specimen  DISPOSITION OF SPECIMEN:  N/A  COUNTS:  YES  TOURNIQUET:  * No tourniquets in log *  DICTATION: .Dragon Dictation  PLAN OF CARE: Discharge to home after PACU  PATIENT DISPOSITION:  PACU - hemodynamically stable.   Delay start of Pharmacological VTE agent (>24hrs) due to surgical blood loss or risk of bleeding: no

## 2013-06-03 NOTE — H&P (View-Only) (Signed)
This document contains confidential information from a [facility] medical record system and may be unauthenticated. Release may be made only with a valid authorization or in accordance with applicable policies of Medical Center or its affiliates. This document must be maintained in a secure manner or discarded/destroyed as required by Medical Center policy or by a confidential means such as shredding.     Krystal [name]  05/19/2013 8:00 AM   Office Visit  MRN:  [id number]  Department: Plastic Surgery  Dept Phone: [phone]  Description: Female DOB: 12/23/1950  Provider: Shawn Montgomery Rayburn, PA-C   Diagnoses    Status post bilateral breast reconstruction       V43.82    Breast asymmetry following reconstructive surgery       612.1     Vitals - Last Recorded    112/65  75  98.6 F (37 C)  1.575 m (5' 2")  83.462 kg (184 lb)  33.65 kg/m2    Subjective:    Patient ID: Krystal [name] is a 62 y.o. female.  HPI The patient is a 62 yrs old wf here for history and physical for right breast revision surgery following secondary breast reconstruction with silicone implants on 01/29/2013 after bilateral mastectomies and placement of expanders. The right breast has fallen and moved lateral.   Originally, she was diagnosed with stage II invasive ductal carcinoma of the left breast and underwent bilateral mastectomies in 2012. She was ER/PR positive, HER-2/neu negative. She underwent adjuvant chemotherapy (Adriamycin and Cytoxan followed by weekly single agent Taxol completed December 2012). She is on adjuvant antiestrogen therapy with tamoxifen 20 mg daily daily. A total of 5 years therapy is planned. Aromasin and Arimidex were discontinued for intolerance. She is 5 feet 3 inches tall and weighs 175 pounds. She was a 38DD bra size. She also had a breast reduction in the past with the typical scars at the inframammary fold and vertical scar.  We used Allomed for the ADM.   She underwent  placement of bilateral silicone implants and lipografting. She is very pleased with the left breast, but notes the right breast has fallen and moved lateral and this is noted on exam.  She does not desire nipple tattooing at this time.  The following portions of the patient's history were reviewed and updated as appropriate: allergies, current medications, past family history, past medical history, past social history, past surgical history and problem list.  Review of Systems  All other systems reviewed and are negative.     Objective:   Physical Exam  Constitutional: She is oriented to person, place, and time. She appears well-developed and well-nourished. No distress.  HENT:   Head: Normocephalic and atraumatic.   Nose: Nose normal.   Mouth/Throat: Oropharynx is clear and moist.  Eyes: Conjunctivae and EOM are normal. Pupils are equal, round, and reactive to light.  Neck: Normal range of motion. Neck supple. No tracheal deviation present. No thyromegaly present.  Cardiovascular: Normal rate, regular rhythm, normal heart sounds and intact distal pulses.   Pulmonary/Chest: Effort normal and breath sounds normal. No stridor. No respiratory distress. She has no wheezes. She has no rales. She exhibits no tenderness.  Some mild tenderness over the right anterior chest wall and laterally over the right breast border to palpation  Abdominal: Soft. Bowel sounds are normal. She exhibits no distension and no mass. There is no tenderness.  Musculoskeletal: Normal range of motion. She exhibits no edema and no tenderness.  Neurological: She   is alert and oriented to person, place, and time. No cranial nerve deficit. She exhibits normal muscle tone. Coordination normal.  Skin: Skin is warm and dry. No rash noted. No erythema.  Psychiatric: She has a normal mood and affect. Her behavior is normal. Judgment and thought content normal.      Assessment:     1.  Status post bilateral breast reconstruction     2.  Breast asymmetry following reconstructive surgery        Plan:   Plan right breast revision surgery with repositioning of the implant more medially with release of the capsule laterally and medially. The procedure possible risks, benefits and complications were discussed with the patient and she desires to proceed and consent was obtained. She currently has 480 cc silicone implants in place.    Medications Ordered This Encounter     HYDROcodone-acetaminophen (NORCO) 5-325 mg per Take 1 tablet by mouth every 6 (six) hours as needed for up to 10 days for Pain.    diazepam (VALIUM) 2 MG tablet Take 1 tablet (2 mg total) by mouth every 6 (six) hours as needed for up to 10 days for Anxiety.    cephalexin (KEFLEX) 500 MG capsule Take 1 capsule (500 mg total) by mouth 4 times daily.     

## 2013-06-03 NOTE — Transfer of Care (Signed)
Immediate Anesthesia Transfer of Care Note  Patient: Krystal [name]  Procedure(s) Performed: Procedure(s): RIGHT BREAST CAPSULE CONSTRACTURE (Right) LIPOSUCTION (Right)  Patient Location: PACU  Anesthesia Type:General  Level of Consciousness: awake, alert  and oriented  Airway & Oxygen Therapy: Patient Spontanous Breathing and Patient connected to face mask oxygen  Post-op Assessment: Report given to PACU RN and Post -op Vital signs reviewed and stable  Post vital signs: Reviewed and stable  Complications: No apparent anesthesia complications

## 2013-06-03 NOTE — Op Note (Signed)
Op report Bilateral Exchange   DATE OF OPERATION: 06/03/2013  LOCATION: [facility] outpatient  SURGICAL DIVISION: Plastic Surgery  PREOPERATIVE DIAGNOSES:  1. History of breast cancer.  2. Acquired absence of bilateral breast.  3. Post surgical asymmetry.  POSTOPERATIVE DIAGNOSES:  1. History of breast cancer.  2. Acquired absence of bilateral breast.  3. Post surgical asymmetry.  PROCEDURE:  1.Right capsulotomy laterally and inferiorly with repositioning of the implant and lateral breast liposuction.   SURGEON: Wayland Denis, DO  ASSISTANT: Shawn Rayburn, PA  ANESTHESIA:  General.   COMPLICATIONS: None.   INDICATIONS FOR PROCEDURE:  The patient, Krystal [name], is a 62 y.o. female born on 1951/03/04, is here for treatment of right capsule contracture and asymmetry after surgical reconstruct.  She had tissue expanders placed at the time of mastectomies.    CONSENT:  Informed consent was obtained directly from the patient. Risks, benefits and alternatives were fully discussed. Specific risks including but not limited to bleeding, infection, hematoma, seroma, scarring, pain, implant infection, implant extrusion, capsular contracture, asymmetry, wound healing problems, and need for further surgery were all discussed. The patient did have an ample opportunity to have her questions answered to her satisfaction.   DESCRIPTION OF PROCEDURE:  The patient was taken to the operating room. SCDs were placed and IV antibiotics were given. The patient's chest was prepped and draped in a sterile fashion. A time out was performed and the implants to be used were identified.  One percent Xylocaine with epinephrine was used to infiltrate the area.   The old mastectomy scar was opened with a #15 blade. The ADM was split to expose the implant which was intact.  The implant was removed and placed in antibiotic solution.  Inspection of the pocket showed a normal healthy capsule and good integration of the biologic  matrix. The capsule was excised in the lateral and inferior aspect.  The 2-0 PDS was used to reposition the implant more medially.  The implant was placed back in the pocket and oriented appropriately. The pectoralis major muscle and ADM were re-closed with a 3-0 running Monocryl suture followed by 4-0 Monocryl. The remaining skin was closed with 5-0 Moncryl with a running subcutaneous closure.  Dermabond was applied. A breast binder and ACE wraps were placed.  The patient was awakened from anesthesia and taken to the recovery room in satisfactory condition.

## 2013-06-03 NOTE — Anesthesia Postprocedure Evaluation (Signed)
Anesthesia Post Note  Patient: Krystal [name]  Procedure(s) Performed: Procedure(s) (LRB): RIGHT BREAST CAPSULE CONSTRACTURE (Right) LIPOSUCTION (Right)  Anesthesia type: General  Patient location: PACU  Post pain: Pain level controlled and Adequate analgesia  Post assessment: Post-op Vital signs reviewed, Patient's Cardiovascular Status Stable, Respiratory Function Stable, Patent Airway and Pain level controlled  Last Vitals:  Filed Vitals:   06/03/13 1515  BP: 121/60  Pulse: 78  Temp:   Resp: 15    Post vital signs: Reviewed and stable  Level of consciousness: awake, alert  and oriented  Complications: No apparent anesthesia complications

## 2013-06-03 NOTE — Anesthesia Procedure Notes (Signed)
Procedure Name: LMA Insertion Date/Time: 06/03/2013 1:39 PM Performed by: Burna Cash Pre-anesthesia Checklist: Patient identified, Emergency Drugs available, Suction available and Patient being monitored Patient Re-evaluated:Patient Re-evaluated prior to inductionOxygen Delivery Method: Circle System Utilized Preoxygenation: Pre-oxygenation with 100% oxygen Intubation Type: IV induction Ventilation: Mask ventilation without difficulty LMA: LMA inserted LMA Size: 4.0 Number of attempts: 1 Airway Equipment and Method: bite block Placement Confirmation: positive ETCO2 Tube secured with: Tape Dental Injury: Teeth and Oropharynx as per pre-operative assessment

## 2013-06-04 ENCOUNTER — Encounter (HOSPITAL_BASED_OUTPATIENT_CLINIC_OR_DEPARTMENT_OTHER): Payer: Self-pay | Admitting: Plastic Surgery

## 2013-06-12 ENCOUNTER — Other Ambulatory Visit: Payer: Self-pay | Admitting: *Deleted

## 2013-06-12 DIAGNOSIS — C50919 Malignant neoplasm of unspecified site of unspecified female breast: Secondary | ICD-10-CM

## 2013-06-12 MED ORDER — TAMOXIFEN CITRATE 10 MG PO TABS: 10.0000 mg | ORAL_TABLET | Freq: Every day | ORAL | Status: DC

## 2013-08-31 ENCOUNTER — Encounter: Payer: Self-pay | Admitting: Oncology

## 2013-08-31 ENCOUNTER — Telehealth: Payer: Self-pay | Admitting: Oncology

## 2013-08-31 ENCOUNTER — Other Ambulatory Visit (HOSPITAL_BASED_OUTPATIENT_CLINIC_OR_DEPARTMENT_OTHER): Payer: Medicare HMO

## 2013-08-31 ENCOUNTER — Ambulatory Visit (HOSPITAL_BASED_OUTPATIENT_CLINIC_OR_DEPARTMENT_OTHER): Payer: Medicare HMO | Admitting: Oncology

## 2013-08-31 VITALS — BP 114/70 | HR 73 | Temp 98.3°F | Resp 20 | Ht 63.0 in | Wt 180.8 lb

## 2013-08-31 DIAGNOSIS — C50219 Malignant neoplasm of upper-inner quadrant of unspecified female breast: Secondary | ICD-10-CM

## 2013-08-31 DIAGNOSIS — C50919 Malignant neoplasm of unspecified site of unspecified female breast: Secondary | ICD-10-CM

## 2013-08-31 DIAGNOSIS — R232 Flushing: Secondary | ICD-10-CM

## 2013-08-31 DIAGNOSIS — C773 Secondary and unspecified malignant neoplasm of axilla and upper limb lymph nodes: Secondary | ICD-10-CM

## 2013-08-31 DIAGNOSIS — M129 Arthropathy, unspecified: Secondary | ICD-10-CM

## 2013-08-31 LAB — CBC WITH DIFFERENTIAL/PLATELET
BASO%: 0.7 % (ref 0.0–2.0)
Basophils Absolute: 0 10*3/uL (ref 0.0–0.1)
EOS ABS: 0.3 10*3/uL (ref 0.0–0.5)
EOS%: 4.6 % (ref 0.0–7.0)
HCT: 41.4 % (ref 34.8–46.6)
HGB: 13.9 g/dL (ref 11.6–15.9)
LYMPH%: 35.1 % (ref 14.0–49.7)
MCH: 31 pg (ref 25.1–34.0)
MCHC: 33.6 g/dL (ref 31.5–36.0)
MCV: 92.2 fL (ref 79.5–101.0)
MONO#: 0.5 10*3/uL (ref 0.1–0.9)
MONO%: 8.6 % (ref 0.0–14.0)
NEUT#: 3.1 10*3/uL (ref 1.5–6.5)
NEUT%: 51 % (ref 38.4–76.8)
PLATELETS: 283 10*3/uL (ref 145–400)
RBC: 4.49 10*6/uL (ref 3.70–5.45)
RDW: 12.8 % (ref 11.2–14.5)
WBC: 6.1 10*3/uL (ref 3.9–10.3)
lymph#: 2.1 10*3/uL (ref 0.9–3.3)

## 2013-08-31 LAB — COMPREHENSIVE METABOLIC PANEL (CC13)
ALBUMIN: 3.9 g/dL (ref 3.5–5.0)
ALT: 39 U/L (ref 0–55)
ANION GAP: 10 meq/L (ref 3–11)
AST: 35 U/L — ABNORMAL HIGH (ref 5–34)
Alkaline Phosphatase: 57 U/L (ref 40–150)
BUN: 13 mg/dL (ref 7.0–26.0)
CALCIUM: 9.2 mg/dL (ref 8.4–10.4)
CHLORIDE: 103 meq/L (ref 98–109)
CO2: 28 mEq/L (ref 22–29)
Creatinine: 0.8 mg/dL (ref 0.6–1.1)
GLUCOSE: 97 mg/dL (ref 70–140)
POTASSIUM: 3.5 meq/L (ref 3.5–5.1)
SODIUM: 140 meq/L (ref 136–145)
TOTAL PROTEIN: 6.9 g/dL (ref 6.4–8.3)
Total Bilirubin: 0.45 mg/dL (ref 0.20–1.20)

## 2013-08-31 NOTE — Telephone Encounter (Signed)
, °

## 2013-08-31 NOTE — Progress Notes (Signed)
OFFICE PROGRESS NOTE  CC  [name],[name], MD [address] [place] [address]  DIAGNOSIS: 63 year old female with stage II invasive ductal carcinoma of the left breast status post mastectomy of the left breast with prophylactic right mastectomy.  Breast cancer metastasized to axillary lymph node TxN1   Primary site: Breast (Left)   Staging method: AJCC 6th Edition   Clinical: Stage IIA (TX, N1, MX) signed by Krystal Robinson, MD on 08/31/2013  4:58 PM   Summary: Stage IIA (TX, N1, MX)  PRIOR THERAPY:  #1 patient is status post bilateral mastectomies. She underwent a therapeutic left mastectomy for a stage II high-grade invasive ductal carcinoma that was ER positive PR positive HER-2/neu negative. Patient also had a prophylactic right mastectomy.  #2 patient is now status post adjuvant chemotherapy following NSABP B. 49 clinical trial. She received 4 cycles of dose dense Adriamycin and Cytoxan followed by weekly single agent Taxol completed December 2012.  #3 patient is on adjuvant antiestrogen therapy consisting of tamoxifen 20 mg daily daily. A total of 5 years therapy is planned. Aromasin and Arimidex have been discontinued as patient is not able tolerate these.  #4 patient has begun the process of reconstruction. She now has bilateral implants in place. She will have exchange soon.  CURRENT THERAPY: Tamoxifen 20 mg daily  INTERVAL HISTORY: Krystal [name] 63 y.o. female returns for followup visit today.  Patient is taking tamoxifen. She still is experiencing considerable amount of hot flashes with significant sweating. Otherwise she has no aches or pains. She denies any nausea or vomiting. She did undergo bilateral reconstruction. She is happy with the left side but the right side she is concerned about. She tells me that she discontinued the reconstruction early. She is exercising and trying to lose weight. She is quite concerned about her hair which is thinner since she has  been on tamoxifen. She does tell me that there is history female pattern baldness in her family is well. Remainder of the 10 point review of systems is negative.   MEDICAL HISTORY: Past Medical History  Diagnosis Date  . Fibromyalgia     muscle weakness and pain  . Depression   . Hypertension     benign  . Hyperlipidemia   . Rotator cuff rupture   . Arthritis     osteo  . Synovitis   . Rosacea   . Cataract   . Plantar fasciitis   . Ulcer   . Blindness of right eye at birth  . Osteoporosis   . Restless leg syndrome   . Anemia   . Edema 07/27/2011  . Cancer     Left breast    ALLERGIES:  has No Known Allergies.  MEDICATIONS:  Current Outpatient Prescriptions  Medication Sig Dispense Refill  . alendronate (FOSAMAX) 70 MG tablet Take 70 mg by mouth every 7 (seven) days. Take with a full glass of water on an empty stomach.       Marland Kitchen amLODipine (NORVASC) 10 MG tablet       . aspirin 81 MG tablet Take 81 mg by mouth daily.      Marland Kitchen atenolol (TENORMIN) 100 MG tablet Take 100 mg by mouth daily.        . calcium carbonate (OS-CAL) 600 MG TABS Take 600 mg by mouth 2 (two) times daily with a meal.      . carisoprodol (SOMA) 350 MG tablet Take 350 mg by mouth 2 (two) times daily.      Marland Kitchen  cholecalciferol (VITAMIN D) 1000 UNITS tablet Take 1,000 Units by mouth daily.      . cyclobenzaprine (FLEXERIL) 10 MG tablet       . furosemide (LASIX) 20 MG tablet Take 40 mg by mouth as needed.       Marland Kitchen lisinopril-hydrochlorothiazide (PRINZIDE,ZESTORETIC) 20-25 MG per tablet       . potassium chloride SA (K-DUR,KLOR-CON) 20 MEQ tablet Take 20 mEq by mouth 2 (two) times daily.      . pregabalin (LYRICA) 300 MG capsule Take 300 mg by mouth 2 (two) times daily.        . ranitidine (ZANTAC) 150 MG tablet Take 150 mg by mouth 2 (two) times daily.        Marland Kitchen rOPINIRole (REQUIP) 0.5 MG tablet Take 0.5 mg by mouth every evening.      . tamoxifen (NOLVADEX) 10 MG tablet Take 1 tablet (10 mg total) by mouth daily.  90  tablet  1  . zolpidem (AMBIEN CR) 12.5 MG CR tablet Take 10 mg by mouth at bedtime as needed. Pt takes 41m tablet at bedtime        No current facility-administered medications for this visit.    SURGICAL HISTORY:  Past Surgical History  Procedure Laterality Date  . Abdominal hysterectomy  1986    For bleeding and pain  . Eye surgery    . Bladder surgery  2006    Bladder tack  . Throat surgery    . Nose surgery  2007  . Carpal tunnel release    . Neuroma surgery    . Uvulopalatopharyngoplasty    . Mastectomy  2012    rt prophalactic mast-snbx  . Mastectomy modified radical  2012    left-axillary nodes  . Port-a-cath removal      insertion and  . Breast reconstruction Bilateral 09/17/2012    Procedure: BREAST RECONSTRUCTION;  Surgeon: CTheodoro Kos DO;  [facility]  Service: Plastics;  Laterality: Bilateral;  BILATERAL BREAST RECONSTRUCTION WITH TISSUE EXPANDERS AND ALLOMED  . Liposuction Bilateral 01/29/2013    Procedure: LIPOSUCTION;  Surgeon: CTheodoro Kos DO;  [facility] [facility]  Service: Plastics;  Laterality: Bilateral;  . Breast reconstruction Right 06/03/2013    Procedure: RIGHT BREAST CAPSULE CONSTRACTURE;  Surgeon: CTheodoro Kos DO;  [facility] [facility]  Service: Plastics;  Laterality: Right;  . Liposuction Right 06/03/2013    Procedure: LIPOSUCTION;  Surgeon: CTheodoro Kos DO;  [facility]  Service: Plastics;  Laterality: Right;   Health Maintenance  Mammogram: 11/2010 Colonoscopy: 2010 Bone Density Scan: 2010 Pap Smear: 2011 Eye Exam: 2011 Vitamin D Level: with next draw Lipid Panel: 2012  REVIEW OF SYSTEMS:   General: fatigue (-), night sweats (-), fever (-), pain (-) Lymph: palpable nodes (-) HEENT: vision changes (-), mucositis (-), gum bleeding (-), epistaxis (-) Cardiovascular: chest pain (-), palpitations (-) Pulmonary: shortness of breath (-), dyspnea on exertion  (-), cough (-), hemoptysis (-) GI:  Early satiety (-), melena (-), dysphagia (-), nausea/vomiting (-), diarrhea (-) GU: dysuria (-), hematuria (-), incontinence (-) Musculoskeletal: joint swelling (-), joint pain (+), back pain (-) Neuro: weakness (-), numbness (-), headache (-), confusion (-) Skin: Rash (-), lesions (-), dryness (-) Psych: depression (-), suicidal/homicidal ideation (-), feeling of hopelessness (-)   PHYSICAL EXAMINATION:  BP 114/70  Pulse 73  Temp(Src) 98.3 F (36.8 C) (Oral)  Resp 20  Ht '5\' 3"'  (1.6 m)  Wt 180 lb 12.8  oz (82.01 kg)  BMI 32.04 kg/m2 General: Patient is a well appearing female in no acute distress HEENT: PERRLA, sclerae anicteric no conjunctival pallor, MMM Neck: supple, no palpable adenopathy Lungs: clear to auscultation bilaterally, no wheezes, rhonchi, or rales Cardiovascular: regular rate rhythm, S1, S2, no murmurs, rubs or gallops Abdomen: Soft, non-tender, non-distended, normoactive bowel sounds, no HSM Extremities: warm and well perfused, no clubbing, cyanosis, or edema Skin: No rashes or lesions Neuro: Non-focal ECOG PERFORMANCE STATUS: 1 - Symptomatic but completely ambulatory Breasts: bilateral mastectomy scars normal, no nodularity, or sign of recurrence.     LABORATORY DATA: Lab Results  Component Value Date   WBC 6.1 08/31/2013   HGB 13.9 08/31/2013   HCT 41.4 08/31/2013   MCV 92.2 08/31/2013   PLT 283 08/31/2013      Chemistry      Component Value Date/Time   NA 140 08/31/2013 1032   NA 137 06/02/2013 1230   K 3.5 08/31/2013 1032   K 4.2 06/02/2013 1230   CL 98 06/02/2013 1230   CL 102 11/17/2012 0937   CO2 28 08/31/2013 1032   CO2 29 06/02/2013 1230   BUN 13.0 08/31/2013 1032   BUN 12 06/02/2013 1230   CREATININE 0.8 08/31/2013 1032   CREATININE 0.90 06/02/2013 1230      Component Value Date/Time   CALCIUM 9.2 08/31/2013 1032   CALCIUM 9.4 06/02/2013 1230   ALKPHOS 57 08/31/2013 1032   ALKPHOS 95 02/08/2012 1003   AST 35* 08/31/2013  1032   AST 19 02/08/2012 1003   ALT 39 08/31/2013 1032   ALT 16 02/08/2012 1003   BILITOT 0.45 08/31/2013 1032   BILITOT 0.6 02/08/2012 1003       RADIOGRAPHIC STUDIES:  No results found.  ASSESSMENT/PLAN: 63 year old female with:  #1 stage II high-grade invasive ductal carcinoma with a positive lymph node patient is status post bilateral mastectomy followed by adjuvant chemotherapy following NSABP B. 49 clinical study. Patient has no evidence of recurrent disease.  #2 patient is now on tamoxifen 10 mg daily overall she seems to be tolerating it well except for hot flashes and some fatigue.   #3 hot flashes/night sweats: We discussed different strategies to help with these. For the most part she is tolerating them.  #4 thinning hair  #5 arthritis: Significantly improved since being on tamoxifen.  #6 patient will be seen back in 6 months time in followup.  All questions were answered. The patient knows to call the clinic with any problems, questions or concerns. We can certainly see the patient much sooner if necessary.  I spent 25 minutes counseling the patient face to face. The total time spent in the appointment was 30 minutes.  Marcy Panning, MD Medical/Oncology [facility] [phone] (beeper) [phone] (Office)  08/31/2013, 11:23 AM

## 2013-10-14 ENCOUNTER — Telehealth: Payer: Self-pay | Admitting: Oncology

## 2013-10-14 NOTE — Telephone Encounter (Signed)
, °

## 2013-10-22 ENCOUNTER — Encounter: Payer: Self-pay | Admitting: *Deleted

## 2013-12-07 ENCOUNTER — Other Ambulatory Visit: Payer: Self-pay

## 2013-12-07 DIAGNOSIS — C773 Secondary and unspecified malignant neoplasm of axilla and upper limb lymph nodes: Principal | ICD-10-CM

## 2013-12-07 DIAGNOSIS — C50919 Malignant neoplasm of unspecified site of unspecified female breast: Secondary | ICD-10-CM

## 2013-12-07 MED ORDER — TAMOXIFEN CITRATE 10 MG PO TABS: 10.0000 mg | ORAL_TABLET | Freq: Every day | ORAL | Status: DC

## 2013-12-07 NOTE — Telephone Encounter (Signed)
Pt requested refill on Tamoxifen.  [facility] does not have electronic prescriptions.  Printed for [facility] to sign, then fax to [phone].

## 2013-12-08 NOTE — Telephone Encounter (Signed)
Faxed to [facility].  Sent to scan.

## 2014-03-01 ENCOUNTER — Other Ambulatory Visit: Payer: Medicare HMO

## 2014-03-01 ENCOUNTER — Ambulatory Visit: Payer: Medicare HMO | Admitting: Adult Health

## 2014-03-05 ENCOUNTER — Other Ambulatory Visit: Payer: Self-pay | Admitting: *Deleted

## 2014-03-05 DIAGNOSIS — C50912 Malignant neoplasm of unspecified site of left female breast: Secondary | ICD-10-CM

## 2014-03-05 DIAGNOSIS — C773 Secondary and unspecified malignant neoplasm of axilla and upper limb lymph nodes: Principal | ICD-10-CM

## 2014-03-08 ENCOUNTER — Other Ambulatory Visit (HOSPITAL_BASED_OUTPATIENT_CLINIC_OR_DEPARTMENT_OTHER): Payer: Medicare HMO

## 2014-03-08 ENCOUNTER — Ambulatory Visit (HOSPITAL_BASED_OUTPATIENT_CLINIC_OR_DEPARTMENT_OTHER): Payer: Medicare HMO | Admitting: Adult Health

## 2014-03-08 ENCOUNTER — Telehealth: Payer: Self-pay | Admitting: Hematology and Oncology

## 2014-03-08 ENCOUNTER — Encounter: Payer: Self-pay | Admitting: Adult Health

## 2014-03-08 VITALS — BP 112/66 | HR 73 | Temp 98.2°F | Resp 20 | Ht 63.0 in | Wt 187.3 lb

## 2014-03-08 DIAGNOSIS — R232 Flushing: Secondary | ICD-10-CM

## 2014-03-08 DIAGNOSIS — Z853 Personal history of malignant neoplasm of breast: Secondary | ICD-10-CM

## 2014-03-08 DIAGNOSIS — C50912 Malignant neoplasm of unspecified site of left female breast: Secondary | ICD-10-CM

## 2014-03-08 DIAGNOSIS — C773 Secondary and unspecified malignant neoplasm of axilla and upper limb lymph nodes: Principal | ICD-10-CM

## 2014-03-08 LAB — CBC WITH DIFFERENTIAL/PLATELET
BASO%: 1.2 % (ref 0.0–2.0)
Basophils Absolute: 0.1 10*3/uL (ref 0.0–0.1)
EOS%: 5.3 % (ref 0.0–7.0)
Eosinophils Absolute: 0.4 10*3/uL (ref 0.0–0.5)
HCT: 42.5 % (ref 34.8–46.6)
HGB: 14.1 g/dL (ref 11.6–15.9)
LYMPH#: 2.2 10*3/uL (ref 0.9–3.3)
LYMPH%: 27.5 % (ref 14.0–49.7)
MCH: 30.6 pg (ref 25.1–34.0)
MCHC: 33.1 g/dL (ref 31.5–36.0)
MCV: 92.5 fL (ref 79.5–101.0)
MONO#: 0.6 10*3/uL (ref 0.1–0.9)
MONO%: 7.6 % (ref 0.0–14.0)
NEUT#: 4.6 10*3/uL (ref 1.5–6.5)
NEUT%: 58.4 % (ref 38.4–76.8)
Platelets: 289 10*3/uL (ref 145–400)
RBC: 4.59 10*6/uL (ref 3.70–5.45)
RDW: 13.7 % (ref 11.2–14.5)
WBC: 7.9 10*3/uL (ref 3.9–10.3)

## 2014-03-08 LAB — COMPREHENSIVE METABOLIC PANEL (CC13)
ALT: 22 U/L (ref 0–55)
ANION GAP: 11 meq/L (ref 3–11)
AST: 24 U/L (ref 5–34)
Albumin: 3.8 g/dL (ref 3.5–5.0)
Alkaline Phosphatase: 52 U/L (ref 40–150)
BUN: 10.8 mg/dL (ref 7.0–26.0)
CHLORIDE: 100 meq/L (ref 98–109)
CO2: 29 meq/L (ref 22–29)
Calcium: 9.4 mg/dL (ref 8.4–10.4)
Creatinine: 0.9 mg/dL (ref 0.6–1.1)
Glucose: 113 mg/dl (ref 70–140)
Potassium: 3.6 mEq/L (ref 3.5–5.1)
SODIUM: 140 meq/L (ref 136–145)
TOTAL PROTEIN: 7 g/dL (ref 6.4–8.3)
Total Bilirubin: 0.25 mg/dL (ref 0.20–1.20)

## 2014-03-08 NOTE — Progress Notes (Addendum)
OFFICE PROGRESS NOTE  CC  [name],[name], MD [address] [place] [address]  DIAGNOSIS: 63 year old female with stage II invasive ductal carcinoma of the left breast status post mastectomy of the left breast with prophylactic right mastectomy.  Breast cancer metastasized to axillary lymph node TxN1   Primary site: Breast (Left)   Staging method: AJCC 6th Edition   Clinical: Stage IIA (TX, N1, MX) signed by [name], MD on 08/31/2013  4:58 PM   Summary: Stage IIA (TX, N1, MX)  PRIOR THERAPY:  #1 patient is status post bilateral mastectomies. She underwent a therapeutic left mastectomy for a stage II high-grade invasive ductal carcinoma that was ER positive PR positive HER-2/neu negative. Patient also had a prophylactic right mastectomy.  #2 patient is now status post adjuvant chemotherapy following NSABP B. 49 clinical trial. She received 4 cycles of dose dense Adriamycin and Cytoxan followed by weekly single agent Taxol completed December 2012.  #3 patient is on adjuvant antiestrogen therapy consisting of tamoxifen 10 mg daily daily. A total of 5 years therapy is planned. She has also been on Aromasin and Arimidex in the past, and was unable to tolerate these as well.    #4 Patient is s/p bilateral breast reconstruction by Dr. Migdalia Dk  CURRENT THERAPY: Tamoxifen 10 mg daily  INTERVAL HISTORY: Krystal [name] 63 y.o. female returns for followup visit today.  Patient is taking tamoxifen 79m daily.  She continues to have longstanding hot flashes.  She is on lyrica and does have some swelling related to this.  She is otherwise doing well and follows with her PCP Dr. HKary Kosevery 3-6 months.  She denies fevers, chills, nausea, vomiting, constipation, diarrhea, numbness/tingling, or any further concerns.     MEDICAL HISTORY: Past Medical History  Diagnosis Date  . Fibromyalgia     muscle weakness and pain  . Depression   . Hypertension     benign  . Hyperlipidemia    . Rotator cuff rupture   . Arthritis     osteo  . Synovitis   . Rosacea   . Cataract   . Plantar fasciitis   . Ulcer   . Blindness of right eye at birth  . Osteoporosis   . Restless leg syndrome   . Anemia   . Edema 07/27/2011  . Cancer     Left breast    ALLERGIES:  has No Known Allergies.  MEDICATIONS:  Current Outpatient Prescriptions  Medication Sig Dispense Refill  . alendronate (FOSAMAX) 70 MG tablet Take 70 mg by mouth every 7 (seven) days. Take with a full glass of water on an empty stomach.       .Marland KitchenamLODipine (NORVASC) 10 MG tablet       . aspirin 81 MG tablet Take 81 mg by mouth daily.      .Marland Kitchenatenolol (TENORMIN) 100 MG tablet Take 100 mg by mouth daily.        . calcium carbonate (OS-CAL) 600 MG TABS Take 600 mg by mouth 2 (two) times daily with a meal.      . carisoprodol (SOMA) 350 MG tablet Take 350 mg by mouth 2 (two) times daily.      . cholecalciferol (VITAMIN D) 1000 UNITS tablet Take 2,000 Units by mouth daily.       . cyclobenzaprine (FLEXERIL) 10 MG tablet       . furosemide (LASIX) 20 MG tablet Take 40 mg by mouth as needed.       .Marland Kitchen  lisinopril-hydrochlorothiazide (PRINZIDE,ZESTORETIC) 20-25 MG per tablet       . potassium chloride SA (K-DUR,KLOR-CON) 20 MEQ tablet Take 20 mEq by mouth 2 (two) times daily.      . pregabalin (LYRICA) 300 MG capsule Take 300 mg by mouth 2 (two) times daily.        . ranitidine (ZANTAC) 150 MG tablet Take 150 mg by mouth 2 (two) times daily.        Marland Kitchen rOPINIRole (REQUIP) 0.5 MG tablet Take 0.5 mg by mouth every evening.      . tamoxifen (NOLVADEX) 10 MG tablet Take 1 tablet (10 mg total) by mouth daily.  90 tablet  1  . zolpidem (AMBIEN CR) 12.5 MG CR tablet Take 10 mg by mouth at bedtime as needed. Pt takes 8m tablet at bedtime        No current facility-administered medications for this visit.    SURGICAL HISTORY:  Past Surgical History  Procedure Laterality Date  . Abdominal hysterectomy  1986    For bleeding and  pain  . Eye surgery    . Bladder surgery  2006    Bladder tack  . Throat surgery    . Nose surgery  2007  . Carpal tunnel release    . Neuroma surgery    . Uvulopalatopharyngoplasty    . Mastectomy  2012    rt prophalactic mast-snbx  . Mastectomy modified radical  2012    left-axillary nodes  . Port-a-cath removal      insertion and  . Breast reconstruction Bilateral 09/17/2012    Procedure: BREAST RECONSTRUCTION;  Surgeon: CTheodoro Kos DO;  [place] [facility]  Service: Plastics;  Laterality: Bilateral;  BILATERAL BREAST RECONSTRUCTION WITH TISSUE EXPANDERS AND ALLOMED  . Liposuction Bilateral 01/29/2013    Procedure: LIPOSUCTION;  Surgeon: CTheodoro Kos DO;  [place] [facility]  Service: Plastics;  Laterality: Bilateral;  . Breast reconstruction Right 06/03/2013    Procedure: RIGHT BREAST CAPSULE CONSTRACTURE;  Surgeon: CTheodoro Kos DO;  [facility] [facility]  Service: Plastics;  Laterality: Right;  . Liposuction Right 06/03/2013    Procedure: LIPOSUCTION;  Surgeon: CTheodoro Kos DO;  [place] [facility]  Service: Plastics;  Laterality: Right;   Health Maintenance  Mammogram: 11/2010 Colonoscopy: 2010 Bone Density Scan: 2015 Pap Smear: 2013 Eye Exam: 2011 Lipid Panel: 2015 Vitamin D Level: 2015  REVIEW OF SYSTEMS:   A 10 point review of systems was conducted and is otherwise negative except for what is noted above.     PHYSICAL EXAMINATION:  BP 112/66  Pulse 73  Temp(Src) 98.2 F (36.8 C) (Oral)  Resp 20  Ht '5\' 3"'  (1.6 m)  Wt 187 lb 4.8 oz (84.959 kg)  BMI 33.19 kg/m2 GENERAL: Patient is a well appearing female in no acute distress HEENT:  Sclerae anicteric.  Oropharynx clear and moist. No ulcerations or evidence of oropharyngeal candidiasis. Neck is supple.  NODES:  No cervical, supraclavicular, or axillary lymphadenopathy palpated.  BREAST EXAM: s/p bilateral reconstruction, no masses,  swelling, nodules. LUNGS:  Clear to auscultation bilaterally.  No wheezes or rhonchi. HEART:  Regular rate and rhythm. No murmur appreciated. ABDOMEN:  Soft, nontender.  Positive, normoactive bowel sounds. No organomegaly palpated. MSK:  No focal spinal tenderness to palpation. Full range of motion bilaterally in the upper extremities. EXTREMITIES:  No peripheral edema.   SKIN:  Clear with no obvious rashes or skin changes. No nail dyscrasia. NEURO:  Nonfocal. Well oriented.  Appropriate affect. ECOG PERFORMANCE STATUS: 1 - Symptomatic but completely ambulatory    LABORATORY DATA: Lab Results  Component Value Date   WBC 7.9 03/08/2014   HGB 14.1 03/08/2014   HCT 42.5 03/08/2014   MCV 92.5 03/08/2014   PLT 289 03/08/2014      Chemistry      Component Value Date/Time   NA 140 03/08/2014 1247   NA 137 06/02/2013 1230   K 3.6 03/08/2014 1247   K 4.2 06/02/2013 1230   CL 98 06/02/2013 1230   CL 102 11/17/2012 0937   CO2 29 03/08/2014 1247   CO2 29 06/02/2013 1230   BUN 10.8 03/08/2014 1247   BUN 12 06/02/2013 1230   CREATININE 0.9 03/08/2014 1247   CREATININE 0.90 06/02/2013 1230      Component Value Date/Time   CALCIUM 9.4 03/08/2014 1247   CALCIUM 9.4 06/02/2013 1230   ALKPHOS 52 03/08/2014 1247   ALKPHOS 95 02/08/2012 1003   AST 24 03/08/2014 1247   AST 19 02/08/2012 1003   ALT 22 03/08/2014 1247   ALT 16 02/08/2012 1003   BILITOT 0.25 03/08/2014 1247   BILITOT 0.6 02/08/2012 1003       ASSESSMENT/PLAN: 63 year old female with:  #1 stage II high-grade invasive ductal carcinoma with a positive lymph node patient is status post bilateral mastectomy followed by adjuvant chemotherapy following NSABP B. 49 clinical study. Patient has no evidence of recurrent disease.  Her labs are stable, I reviewed them with her in detail.    #2 Dhanvi will continue taking Tamoxifen 71m daily.  She does have hot flashes, and is tolerating this moderately well.    #3 We reviewed her health  maintenance above. She will have her Vitamin D level followed by Dr. HKary Kos  She is taking Vitamin d3 2000 IU daily and will continue this.    #4 We reviewed survivorship in detail.  BAarawill continue with healthy diet, exercise and monthly breast exams.    BShernitawill return in 6 months for evaluation with Dr. GLindi Adie    All questions were answered. The patient knows to call the clinic with any problems, questions or concerns. We can certainly see the patient much sooner if necessary.  I spent 25 minutes counseling the patient face to face. The total time spent in the appointment was 30 minutes.  LMinette Headland NP Medical Oncology [phone]/17/2015, 3:06 PM  Attending Note  I personally saw and examined BVickey Huger The plan of care was discussed with her. I agree with the assessment and plan as documented above. 1. Stage II invasive ductal carcinoma with status post bilateral mastectomy and adjuvant tamoxifen therapy. 2. Severe hot flashes required adjustment of the dosage of tamoxifen to 10 mg daily. Even though she still gets hot flashes it's much more bearable. 3. Return to clinic in 6 months as scheduled. Signed GRulon Eisenmenger MD

## 2014-03-08 NOTE — Patient Instructions (Signed)
You are doing well and have no sign of recurrence.  Continue taking Tamoxifen 10mg  daily.  I recommend healthy diet, exercise, and monthly breast exams.    Please call us if you have any questions or concerns.    Breast Self-Awareness Practicing breast self-awareness may pick up problems early, prevent significant medical complications, and possibly save your life. By practicing breast self-awareness, you can become familiar with how your breasts look and feel and if your breasts are changing. This allows you to notice changes early. It can also offer you some reassurance that your breast health is good. One way to learn what is normal for your breasts and whether your breasts are changing is to do a breast self-exam. If you find a lump or something that was not present in the past, it is best to contact your caregiver right away. Other findings that should be evaluated by your caregiver include nipple discharge, especially if it is bloody; skin changes or reddening; areas where the skin seems to be pulled in (retracted); or new lumps and bumps. Breast pain is seldom associated with cancer (malignancy), but should also be evaluated by a caregiver. HOW TO PERFORM A BREAST SELF-EXAM The best time to examine your breasts is 5-7 days after your menstrual period is over. During menstruation, the breasts are lumpier, and it may be more difficult to pick up changes. If you do not menstruate, have reached menopause, or had your uterus removed (hysterectomy), you should examine your breasts at regular intervals, such as monthly. If you are breastfeeding, examine your breasts after a feeding or after using a breast pump. Breast implants do not decrease the risk for lumps or tumors, so continue to perform breast self-exams as recommended. Talk to your caregiver about how to determine the difference between the implant and breast tissue. Also, talk about the amount of pressure you should use during the exam. Over time,  you will become more familiar with the variations of your breasts and more comfortable with the exam. A breast self-exam requires you to remove all your clothes above the waist. 1. Look at your breasts and nipples. Stand in front of a mirror in a room with good lighting. With your hands on your hips, push your hands firmly downward. Look for a difference in shape, contour, and size from one breast to the other (asymmetry). Asymmetry includes puckers, dips, or bumps. Also, look for skin changes, such as reddened or scaly areas on the breasts. Look for nipple changes, such as discharge, dimpling, repositioning, or redness. 2. Carefully feel your breasts. This is best done either in the shower or tub while using soapy water or when flat on your back. Place the arm (on the side of the breast you are examining) above your head. Use the pads (not the fingertips) of your three middle fingers on your opposite hand to feel your breasts. Start in the underarm area and use  inch (2 cm) overlapping circles to feel your breast. Use 3 different levels of pressure (light, medium, and firm pressure) at each circle before moving to the next circle. The light pressure is needed to feel the tissue closest to the skin. The medium pressure will help to feel breast tissue a little deeper, while the firm pressure is needed to feel the tissue close to the ribs. Continue the overlapping circles, moving downward over the breast until you feel your ribs below your breast. Then, move one finger-width towards the center of the body. Continue to  use the  inch (2 cm) overlapping circles to feel your breast as you move slowly up toward the collar bone (clavicle) near the base of the neck. Continue the up and down exam using all 3 pressures until you reach the middle of the chest. Do this with each breast, carefully feeling for lumps or changes. 3.  Keep a written record with breast changes or normal findings for each breast. By writing this  information down, you do not need to depend only on memory for size, tenderness, or location. Write down where you are in your menstrual cycle, if you are still menstruating. Breast tissue can have some lumps or thick tissue. However, see your caregiver if you find anything that concerns you.  SEEK MEDICAL CARE IF:  You see a change in shape, contour, or size of your breasts or nipples.   You see skin changes, such as reddened or scaly areas on the breasts or nipples.   You have an unusual discharge from your nipples.   You feel a new lump or unusually thick areas.  Document Released: 07/09/2005 Document Revised: 06/25/2012 Document Reviewed: 10/24/2011 [facility] Patient Information 2015 [place], [place]. This information is not intended to replace advice given to you by your health care provider. Make sure you discuss any questions you have with your health care provider.

## 2014-03-08 NOTE — Telephone Encounter (Signed)
per pof to sch pt appt-gave pt copy of sch °

## 2014-05-05 ENCOUNTER — Telehealth: Payer: Self-pay | Admitting: Hematology and Oncology

## 2014-05-05 NOTE — Telephone Encounter (Signed)
Lvm advising appt chg from 2/17 to 3/8 @ 11am due to md on pal. Also mailed revised appt calendar.

## 2014-05-06 ENCOUNTER — Telehealth: Payer: Self-pay | Admitting: Hematology and Oncology

## 2014-05-06 NOTE — Telephone Encounter (Signed)
, °

## 2014-05-15 ENCOUNTER — Other Ambulatory Visit: Payer: Self-pay | Admitting: Nurse Practitioner

## 2014-08-11 ENCOUNTER — Telehealth: Payer: Self-pay | Admitting: *Deleted

## 2014-09-07 DIAGNOSIS — Z0000 Encounter for general adult medical examination without abnormal findings: Secondary | ICD-10-CM | POA: Diagnosis not present

## 2014-09-07 DIAGNOSIS — J0100 Acute maxillary sinusitis, unspecified: Secondary | ICD-10-CM | POA: Diagnosis not present

## 2014-09-07 DIAGNOSIS — I10 Essential (primary) hypertension: Secondary | ICD-10-CM | POA: Diagnosis not present

## 2014-09-07 DIAGNOSIS — Z8739 Personal history of other diseases of the musculoskeletal system and connective tissue: Secondary | ICD-10-CM | POA: Diagnosis not present

## 2014-09-08 ENCOUNTER — Ambulatory Visit: Payer: Medicare HMO | Admitting: Hematology and Oncology

## 2014-09-10 NOTE — Telephone Encounter (Signed)
none

## 2014-09-20 DIAGNOSIS — Z8739 Personal history of other diseases of the musculoskeletal system and connective tissue: Secondary | ICD-10-CM | POA: Diagnosis not present

## 2014-09-24 DIAGNOSIS — B9689 Other specified bacterial agents as the cause of diseases classified elsewhere: Secondary | ICD-10-CM | POA: Diagnosis not present

## 2014-09-24 DIAGNOSIS — J208 Acute bronchitis due to other specified organisms: Secondary | ICD-10-CM | POA: Diagnosis not present

## 2014-09-28 ENCOUNTER — Ambulatory Visit: Payer: Commercial Managed Care - HMO

## 2014-09-28 ENCOUNTER — Telehealth: Payer: Self-pay | Admitting: Hematology and Oncology

## 2014-09-28 ENCOUNTER — Ambulatory Visit: Payer: Medicare HMO | Admitting: Hematology and Oncology

## 2014-09-28 ENCOUNTER — Ambulatory Visit (HOSPITAL_BASED_OUTPATIENT_CLINIC_OR_DEPARTMENT_OTHER): Payer: Commercial Managed Care - HMO

## 2014-09-28 ENCOUNTER — Ambulatory Visit (HOSPITAL_BASED_OUTPATIENT_CLINIC_OR_DEPARTMENT_OTHER): Payer: Commercial Managed Care - HMO | Admitting: Hematology and Oncology

## 2014-09-28 ENCOUNTER — Telehealth: Payer: Self-pay | Admitting: *Deleted

## 2014-09-28 VITALS — BP 98/50 | HR 67 | Temp 98.4°F | Resp 18 | Ht 63.0 in | Wt 177.7 lb

## 2014-09-28 DIAGNOSIS — C50919 Malignant neoplasm of unspecified site of unspecified female breast: Secondary | ICD-10-CM | POA: Diagnosis not present

## 2014-09-28 DIAGNOSIS — Z9013 Acquired absence of bilateral breasts and nipples: Secondary | ICD-10-CM

## 2014-09-28 DIAGNOSIS — Z79810 Long term (current) use of selective estrogen receptor modulators (SERMs): Secondary | ICD-10-CM | POA: Diagnosis not present

## 2014-09-28 DIAGNOSIS — C50912 Malignant neoplasm of unspecified site of left female breast: Secondary | ICD-10-CM

## 2014-09-28 DIAGNOSIS — M8580 Other specified disorders of bone density and structure, unspecified site: Secondary | ICD-10-CM

## 2014-09-28 LAB — COMPREHENSIVE METABOLIC PANEL (CC13)
ALBUMIN: 4 g/dL (ref 3.5–5.0)
ALT: 28 U/L (ref 0–55)
ANION GAP: 12 meq/L — AB (ref 3–11)
AST: 25 U/L (ref 5–34)
Alkaline Phosphatase: 51 U/L (ref 40–150)
BILIRUBIN TOTAL: 0.6 mg/dL (ref 0.20–1.20)
BUN: 22.9 mg/dL (ref 7.0–26.0)
CALCIUM: 9 mg/dL (ref 8.4–10.4)
CHLORIDE: 98 meq/L (ref 98–109)
CO2: 28 meq/L (ref 22–29)
Creatinine: 1.3 mg/dL — ABNORMAL HIGH (ref 0.6–1.1)
EGFR: 45 mL/min/{1.73_m2} — AB (ref >90)
GLUCOSE: 113 mg/dL (ref 70–140)
POTASSIUM: 3.5 meq/L (ref 3.5–5.1)
SODIUM: 138 meq/L (ref 136–145)
TOTAL PROTEIN: 7.1 g/dL (ref 6.4–8.3)

## 2014-09-28 LAB — CBC WITH DIFFERENTIAL/PLATELET
BASO%: 0.7 % (ref 0.0–2.0)
Basophils Absolute: 0.1 10*3/uL (ref 0.0–0.1)
EOS%: 4 % (ref 0.0–7.0)
Eosinophils Absolute: 0.4 10*3/uL (ref 0.0–0.5)
HEMATOCRIT: 46.2 % (ref 34.8–46.6)
HEMOGLOBIN: 15.6 g/dL (ref 11.6–15.9)
LYMPH#: 2.1 10*3/uL (ref 0.9–3.3)
LYMPH%: 24.3 % (ref 14.0–49.7)
MCH: 31 pg (ref 25.1–34.0)
MCHC: 33.7 g/dL (ref 31.5–36.0)
MCV: 91.8 fL (ref 79.5–101.0)
MONO#: 0.6 10*3/uL (ref 0.1–0.9)
MONO%: 7 % (ref 0.0–14.0)
NEUT#: 5.6 10*3/uL (ref 1.5–6.5)
NEUT%: 64 % (ref 38.4–76.8)
Platelets: 286 10*3/uL (ref 145–400)
RBC: 5.03 10*6/uL (ref 3.70–5.45)
RDW: 12.6 % (ref 11.2–14.5)
WBC: 8.8 10*3/uL (ref 3.9–10.3)

## 2014-09-28 NOTE — Telephone Encounter (Signed)
THE LETTER NEEDS THE START AND STOP DATES FOR AROMASIN AND ARIMIDEX. PLEASE MAIL TO PT.'S HOME ADDRESS. THIS NOTE SENT TO Lonell Grandchild.

## 2014-09-28 NOTE — Progress Notes (Signed)
Patient Care Team: [name], MD as PCP - General (Family Medicine)  DIAGNOSIS: Breast cancer, left breast   Staging form: Breast, AJCC 6th Edition     Clinical: Stage IIA (TX, N1, MX) - Signed by [name], MD on 08/31/2013     Pathologic: No stage assigned - Unsigned   SUMMARY OF ONCOLOGIC HISTORY:   Breast cancer, left breast   12/28/2010 Surgery Bilateral mastectomies: Left: no residual cancer, 1/15 LN positive; Right: benign, 0/3 LN   01/29/2011 - 07/02/2011 Chemotherapy adjuvant chemotherapy following NSABP B. 49 clinical trial. She received 4 cycles of dose dense Adriamycin and Cytoxan followed by weekly single agent Taxol    07/31/2011 -  Anti-estrogen oral therapy tamoxifen 10 mg daily daily. A total of 5 years therapy is planned. She has also been on Aromasin and Arimidex in the past, and was unable to tolerate these     CHIEF COMPLIANT: Follow-up of breast cancer on tamoxifen  INTERVAL HISTORY: Krystal [name] is a 64 year old lady with above-mentioned history of left breast cancer who underwent bilateral mastectomies. The left breast one out of 15 axillary lymph nodes was positive in the right breast negative. She underwent adjuvant chemotherapy and then went on tamoxifen since January 2013. She has been tolerating tamoxifen fairly well without any major problems or concerns. She had bilateral breast reconstruction surgeries.  REVIEW OF SYSTEMS:   Constitutional: Denies fevers, chills or abnormal weight loss Eyes: Denies blurriness of vision Ears, nose, mouth, throat, and face: Denies mucositis or sore throat Respiratory: Denies cough, dyspnea or wheezes Cardiovascular: Denies palpitation, chest discomfort or lower extremity swelling Gastrointestinal:  Denies nausea, heartburn or change in bowel habits Skin: Denies abnormal skin rashes Lymphatics: Denies new lymphadenopathy or easy bruising Neurological:Denies numbness, tingling or new weaknesses Behavioral/Psych: Mood is  stable, no new changes  Breast:  denies any pain or lumps or nodules in either breasts All other systems were reviewed with the patient and are negative.  I have reviewed the past medical history, past surgical history, social history and family history with the patient and they are unchanged from previous note.  ALLERGIES:  has No Known Allergies.  MEDICATIONS:  Current Outpatient Prescriptions  Medication Sig Dispense Refill  . alendronate (FOSAMAX) 70 MG tablet Take 70 mg by mouth every 7 (seven) days. Take with a full glass of water on an empty stomach.     Marland Kitchen amLODipine (NORVASC) 10 MG tablet     . aspirin 81 MG tablet Take 81 mg by mouth daily.    Marland Kitchen atenolol (TENORMIN) 100 MG tablet Take 100 mg by mouth daily.      . calcium carbonate (OS-CAL) 600 MG TABS Take 600 mg by mouth 2 (two) times daily with a meal.    . carisoprodol (SOMA) 350 MG tablet Take 350 mg by mouth 2 (two) times daily.    . cholecalciferol (VITAMIN D) 1000 UNITS tablet Take 2,000 Units by mouth daily.     . cyclobenzaprine (FLEXERIL) 10 MG tablet     . furosemide (LASIX) 20 MG tablet Take 40 mg by mouth as needed.     Marland Kitchen lisinopril-hydrochlorothiazide (PRINZIDE,ZESTORETIC) 20-25 MG per tablet     . potassium chloride SA (K-DUR,KLOR-CON) 20 MEQ tablet Take 20 mEq by mouth 2 (two) times daily.    . pregabalin (LYRICA) 300 MG capsule Take 300 mg by mouth 2 (two) times daily.      . ranitidine (ZANTAC) 150 MG tablet Take 150 mg by  mouth 2 (two) times daily.      Marland Kitchen rOPINIRole (REQUIP) 0.5 MG tablet Take 0.5 mg by mouth every evening.    . tamoxifen (NOLVADEX) 10 MG tablet Take 1 tablet (10 mg total) by mouth daily. 90 tablet 1  . zolpidem (AMBIEN CR) 12.5 MG CR tablet Take 10 mg by mouth at bedtime as needed. Pt takes 10mg  tablet at bedtime      No current facility-administered medications for this visit.    PHYSICAL EXAMINATION: ECOG PERFORMANCE STATUS: 0 - Asymptomatic  There were no vitals filed for this  visit. There were no vitals filed for this visit.  GENERAL:alert, no distress and comfortable SKIN: skin color, texture, turgor are normal, no rashes or significant lesions EYES: normal, Conjunctiva are pink and non-injected, sclera clear OROPHARYNX:no exudate, no erythema and lips, buccal mucosa, and tongue normal  NECK: supple, thyroid normal size, non-tender, without nodularity LYMPH:  no palpable lymphadenopathy in the cervical, axillary or inguinal LUNGS: clear to auscultation and percussion with normal breathing effort HEART: regular rate & rhythm and no murmurs and no lower extremity edema ABDOMEN:abdomen soft, non-tender and normal bowel sounds Musculoskeletal:no cyanosis of digits and no clubbing  NEURO: alert & oriented x 3 with fluent speech, no focal motor/sensory deficits BREAST: No palpable masses in the axilla. Bilateral reconstructed breasts are normal. No palpable axillary supraclavicular or infraclavicular adenopathy . (exam performed in the presence of a chaperone)  LABORATORY DATA:  I have reviewed the data as listed   Chemistry      Component Value Date/Time   NA 140 03/08/2014 1247   NA 137 06/02/2013 1230   K 3.6 03/08/2014 1247   K 4.2 06/02/2013 1230   CL 98 06/02/2013 1230   CL 102 11/17/2012 0937   CO2 29 03/08/2014 1247   CO2 29 06/02/2013 1230   BUN 10.8 03/08/2014 1247   BUN 12 06/02/2013 1230   CREATININE 0.9 03/08/2014 1247   CREATININE 0.90 06/02/2013 1230      Component Value Date/Time   CALCIUM 9.4 03/08/2014 1247   CALCIUM 9.4 06/02/2013 1230   ALKPHOS 52 03/08/2014 1247   ALKPHOS 95 02/08/2012 1003   AST 24 03/08/2014 1247   AST 19 02/08/2012 1003   ALT 22 03/08/2014 1247   ALT 16 02/08/2012 1003   BILITOT 0.25 03/08/2014 1247   BILITOT 0.6 02/08/2012 1003       Lab Results  Component Value Date   WBC 7.9 03/08/2014   HGB 14.1 03/08/2014   HCT 42.5 03/08/2014   MCV 92.5 03/08/2014   PLT 289 03/08/2014   NEUTROABS 4.6  03/08/2014   ASSESSMENT & PLAN:  stage II high-grade invasive ductal carcinoma with a positive lymph node patient is status post bilateral mastectomy followed by adjuvant chemotherapy following NSABP B. 49 clinical study.  Current treatment: Tamoxifen 20 mg daily since January 2013 Tamoxifen toxicities: no major toxicities tamoxifen  Bone mineral density was reviewed which revealed a T score of -1.3 mild osteopenia  Breast cancer surveillance: 1. Chest wall and axillary examination 09/28/2014 is normal 2. There is no role of imaging since she had bilateral mastectomies   Return to clinic In 6 months for follow-up   Orders Placed This Encounter  Procedures  . CBC with Differential    Standing Status: Future     Number of Occurrences: 1     Standing Expiration Date: 09/28/2015  . Comprehensive metabolic panel (Cmet) - [facility]    Standing Status: Future  Number of Occurrences: 1     Standing Expiration Date: 09/28/2015   The patient has a good understanding of the overall plan. she agrees with it. She will call with any problems that may develop before her next visit here.   Rulon Eisenmenger, MD

## 2014-09-28 NOTE — Telephone Encounter (Signed)
appts made and avs printed for pt °

## 2014-09-29 LAB — HEMOGLOBIN A1C
Hgb A1c MFr Bld: 6.3 % — ABNORMAL HIGH (ref <5.7)
MEAN PLASMA GLUCOSE: 134 mg/dL — AB (ref <117)

## 2014-09-29 LAB — LIPID PANEL
CHOLESTEROL: 174 mg/dL (ref 0–200)
HDL: 38 mg/dL — ABNORMAL LOW (ref >=46)
LDL Cholesterol: 97 mg/dL (ref 0–99)
TRIGLYCERIDES: 194 mg/dL — AB (ref <150)
Total CHOL/HDL Ratio: 4.6 Ratio
VLDL: 39 mg/dL (ref 0–40)

## 2014-09-29 LAB — VITAMIN D 25 HYDROXY (VIT D DEFICIENCY, FRACTURES): Vit D, 25-Hydroxy: 60 ng/mL (ref 30–100)

## 2014-10-02 DIAGNOSIS — M25462 Effusion, left knee: Secondary | ICD-10-CM | POA: Diagnosis not present

## 2014-10-02 DIAGNOSIS — M25461 Effusion, right knee: Secondary | ICD-10-CM | POA: Diagnosis not present

## 2014-10-02 DIAGNOSIS — M1990 Unspecified osteoarthritis, unspecified site: Secondary | ICD-10-CM | POA: Diagnosis not present

## 2014-10-04 DIAGNOSIS — M25561 Pain in right knee: Secondary | ICD-10-CM | POA: Diagnosis not present

## 2014-10-04 DIAGNOSIS — M25562 Pain in left knee: Secondary | ICD-10-CM | POA: Diagnosis not present

## 2014-10-07 ENCOUNTER — Encounter: Payer: Self-pay | Admitting: *Deleted

## 2014-10-14 DIAGNOSIS — M3501 Sicca syndrome with keratoconjunctivitis: Secondary | ICD-10-CM | POA: Diagnosis not present

## 2014-10-19 DIAGNOSIS — M7632 Iliotibial band syndrome, left leg: Secondary | ICD-10-CM | POA: Diagnosis not present

## 2014-10-19 DIAGNOSIS — M25561 Pain in right knee: Secondary | ICD-10-CM | POA: Diagnosis not present

## 2014-10-19 DIAGNOSIS — M76891 Other specified enthesopathies of right lower limb, excluding foot: Secondary | ICD-10-CM | POA: Diagnosis not present

## 2014-10-19 DIAGNOSIS — M65859 Other synovitis and tenosynovitis, unspecified thigh: Secondary | ICD-10-CM | POA: Diagnosis not present

## 2014-10-19 DIAGNOSIS — M25562 Pain in left knee: Secondary | ICD-10-CM | POA: Diagnosis not present

## 2014-11-24 ENCOUNTER — Encounter: Payer: Self-pay | Admitting: Hematology and Oncology

## 2014-11-24 DIAGNOSIS — M62830 Muscle spasm of back: Secondary | ICD-10-CM | POA: Diagnosis not present

## 2014-11-24 DIAGNOSIS — M5412 Radiculopathy, cervical region: Secondary | ICD-10-CM | POA: Diagnosis not present

## 2014-11-24 DIAGNOSIS — M9901 Segmental and somatic dysfunction of cervical region: Secondary | ICD-10-CM | POA: Diagnosis not present

## 2014-11-24 DIAGNOSIS — M9902 Segmental and somatic dysfunction of thoracic region: Secondary | ICD-10-CM | POA: Diagnosis not present

## 2014-11-24 NOTE — Progress Notes (Signed)
Pt called upset about a denial she received for the 09/28/14 dos.  She didn't understand why it was being denied.  She read the letter to me and it's being denied because no referral was obtained.  I explained this to her, she said she's an ongoing pt at the cancer center so she shouldn't need a referral.  I explained that is part of her insurance policy is to obtain a referral from her PCP to a specialist office.  She is not happy with that and stated she is not paying the bill.

## 2014-11-27 DIAGNOSIS — Y998 Other external cause status: Secondary | ICD-10-CM | POA: Insufficient documentation

## 2014-11-27 DIAGNOSIS — Z23 Encounter for immunization: Secondary | ICD-10-CM | POA: Diagnosis not present

## 2014-11-27 DIAGNOSIS — S0181XA Laceration without foreign body of other part of head, initial encounter: Secondary | ICD-10-CM | POA: Diagnosis not present

## 2014-11-27 DIAGNOSIS — Z79899 Other long term (current) drug therapy: Secondary | ICD-10-CM | POA: Insufficient documentation

## 2014-11-27 DIAGNOSIS — Y9301 Activity, walking, marching and hiking: Secondary | ICD-10-CM | POA: Insufficient documentation

## 2014-11-27 DIAGNOSIS — Y9289 Other specified places as the place of occurrence of the external cause: Secondary | ICD-10-CM | POA: Diagnosis not present

## 2014-11-27 DIAGNOSIS — K088 Other specified disorders of teeth and supporting structures: Secondary | ICD-10-CM | POA: Diagnosis not present

## 2014-11-27 DIAGNOSIS — I10 Essential (primary) hypertension: Secondary | ICD-10-CM | POA: Diagnosis not present

## 2014-11-27 DIAGNOSIS — S01511A Laceration without foreign body of lip, initial encounter: Secondary | ICD-10-CM | POA: Insufficient documentation

## 2014-11-27 DIAGNOSIS — S0993XA Unspecified injury of face, initial encounter: Secondary | ICD-10-CM | POA: Diagnosis present

## 2014-11-27 DIAGNOSIS — W010XXA Fall on same level from slipping, tripping and stumbling without subsequent striking against object, initial encounter: Secondary | ICD-10-CM | POA: Diagnosis not present

## 2014-11-27 NOTE — ED Notes (Signed)
Pt states was walking dog at 2200 when leash became tangled in shoe and pt tripped injuring face. Pt denies loc, denies neck pain, pt with 2 inch linear lower lip laceration, front tooth fracture, bleeding controlled, ice to lip.perr 17mm and brisk. Cms intact in all extremities.

## 2014-11-28 ENCOUNTER — Emergency Department
Admission: EM | Admit: 2014-11-28 | Discharge: 2014-11-28 | Disposition: A | Discharge status: Home / Self Care | Payer: Commercial Managed Care - HMO | Attending: Emergency Medicine | Admitting: Emergency Medicine

## 2014-11-28 DIAGNOSIS — I10 Essential (primary) hypertension: Secondary | ICD-10-CM | POA: Diagnosis not present

## 2014-11-28 DIAGNOSIS — S0181XA Laceration without foreign body of other part of head, initial encounter: Secondary | ICD-10-CM | POA: Diagnosis not present

## 2014-11-28 DIAGNOSIS — S01511A Laceration without foreign body of lip, initial encounter: Secondary | ICD-10-CM

## 2014-11-28 DIAGNOSIS — Z23 Encounter for immunization: Secondary | ICD-10-CM | POA: Diagnosis not present

## 2014-11-28 DIAGNOSIS — K089 Disorder of teeth and supporting structures, unspecified: Secondary | ICD-10-CM

## 2014-11-28 DIAGNOSIS — W19XXXA Unspecified fall, initial encounter: Secondary | ICD-10-CM

## 2014-11-28 DIAGNOSIS — Z79899 Other long term (current) drug therapy: Secondary | ICD-10-CM | POA: Diagnosis not present

## 2014-11-28 MED ORDER — LIDOCAINE HCL (PF) 1 % IJ SOLN
INTRAMUSCULAR | Status: AC
  Filled 2014-11-28: qty 5

## 2014-11-28 MED ORDER — DOXYCYCLINE HYCLATE 50 MG PO CAPS: 100.0000 mg | ORAL_CAPSULE | Freq: Two times a day (BID) | ORAL | Status: DC

## 2014-11-28 MED ORDER — TETANUS-DIPHTH-ACELL PERTUSSIS 5-2.5-18.5 LF-MCG/0.5 IM SUSP
INTRAMUSCULAR | Status: AC
  Filled 2014-11-28: qty 0.5

## 2014-11-28 MED ORDER — TETANUS-DIPHTH-ACELL PERTUSSIS 5-2.5-18.5 LF-MCG/0.5 IM SUSP
0.5000 mL | Freq: Once | INTRAMUSCULAR | Status: AC
  Administered 2014-11-28: 0.5 mL via INTRAMUSCULAR

## 2014-11-28 NOTE — ED Notes (Addendum)
Pt reports falling on face when walking dog.  Pt reports her upper teeth dug into her bottom lip.  Horizontal laceration to bottom lip about 1" long.  Chipped right front tooth and cracked left front tooth noted.  abrasion noted to chin.  Bleeding controlled at this time.  Pt denies LOC.  Pt NAD at this time.

## 2014-11-28 NOTE — ED Provider Notes (Signed)
[facility] Emergency Department Provider Note  ____________________________________________  Time seen: 5:10 AM  I have reviewed the triage vital signs and the nursing notes.   HISTORY  Chief Complaint Facial Injury; Dental Injury; and Lip Laceration     HPI Krystal [name] is a 64 y.o. female who fell walking her dog. This was a small 10 pound dog. She was wearing clogs at the time and felt that the shoes T stumble and fall landing on her chin. When this occurred her upper teeth drove into her lower lip triggering a laceration. The teeth themselves also got chipped. This included teeth #8 and #9. She also sustained a laceration to the chin approximately 3 cm below her lower lip. She is in moderate pain. She denies any loss of consciousness. Bleeding is controlled.     Past Medical History  Diagnosis Date  . Fibromyalgia     muscle weakness and pain  . Depression   . Hypertension     benign  . Hyperlipidemia   . Rotator cuff rupture   . Arthritis     osteo  . Synovitis   . Rosacea   . Cataract   . Plantar fasciitis   . Ulcer   . Blindness of right eye at birth  . Osteoporosis   . Restless leg syndrome   . Anemia   . Edema 07/27/2011  . Cancer     Left breast    Patient Active Problem List   Diagnosis Date Noted  . Acquired absence of bilateral breasts and nipples 09/17/2012  . Edema 07/27/2011  . Breast cancer, left breast 01/16/2011    Past Surgical History  Procedure Laterality Date  . Abdominal hysterectomy  1986    For bleeding and pain  . Eye surgery    . Bladder surgery  2006    Bladder tack  . Throat surgery    . Nose surgery  2007  . Carpal tunnel release    . Neuroma surgery    . Uvulopalatopharyngoplasty    . Mastectomy  2012    rt prophalactic mast-snbx  . Mastectomy modified radical  2012    left-axillary nodes  . Port-a-cath removal      insertion and  . Breast reconstruction Bilateral 09/17/2012    Procedure:  BREAST RECONSTRUCTION;  Surgeon: Theodoro Kos, DO;  Location: [facility];  Service: Plastics;  Laterality: Bilateral;  BILATERAL BREAST RECONSTRUCTION WITH TISSUE EXPANDERS AND ALLOMED  . Liposuction Bilateral 01/29/2013    Procedure: LIPOSUCTION;  Surgeon: Theodoro Kos, DO;  Location: [facility];  Service: Plastics;  Laterality: Bilateral;  . Breast reconstruction Right 06/03/2013    Procedure: RIGHT BREAST CAPSULE CONSTRACTURE;  Surgeon: Theodoro Kos, DO;  Location: [facility];  Service: Plastics;  Laterality: Right;  . Liposuction Right 06/03/2013    Procedure: LIPOSUCTION;  Surgeon: Theodoro Kos, DO;  Location: [facility];  Service: Plastics;  Laterality: Right;    Current Outpatient Rx  Name  Route  Sig  Dispense  Refill  . alendronate (FOSAMAX) 70 MG tablet   Oral   Take 70 mg by mouth every 7 (seven) days. Take with a full glass of water on an empty stomach.          Marland Kitchen amLODipine (NORVASC) 10 MG tablet   Oral   Take 10 mg by mouth daily.          Marland Kitchen atenolol (TENORMIN) 100 MG tablet   Oral  Take 100 mg by mouth daily.           . calcium carbonate (OS-CAL) 600 MG TABS   Oral   Take 600 mg by mouth 2 (two) times daily with a meal.         . carisoprodol (SOMA) 350 MG tablet   Oral   Take 350 mg by mouth 2 (two) times daily.         . cholecalciferol (VITAMIN D) 1000 UNITS tablet   Oral   Take 2,000 Units by mouth daily.          . furosemide (LASIX) 20 MG tablet   Oral   Take 20 mg by mouth 2 (two) times daily.          Marland Kitchen lisinopril-hydrochlorothiazide (PRINZIDE,ZESTORETIC) 20-25 MG per tablet   Oral   Take 1 tablet by mouth daily.          . potassium chloride SA (K-DUR,KLOR-CON) 20 MEQ tablet   Oral   Take 20 mEq by mouth 2 (two) times daily.         . pregabalin (LYRICA) 300 MG capsule   Oral   Take 300 mg by mouth 2 (two) times daily.           . ranitidine (ZANTAC) 150 MG  tablet   Oral   Take 150 mg by mouth 2 (two) times daily.           Marland Kitchen rOPINIRole (REQUIP) 0.5 MG tablet   Oral   Take 0.5 mg by mouth every evening.         . tamoxifen (NOLVADEX) 10 MG tablet   Oral   Take 1 tablet (10 mg total) by mouth daily.   90 tablet   1     Please correct MD Information Dr Marcy Panning,   ...   . Vitamin D, Ergocalciferol, (DRISDOL) 50000 UNITS CAPS capsule   Oral   Take 50,000 Units by mouth.         . zolpidem (AMBIEN) 10 MG tablet   Oral   Take 10 mg by mouth at bedtime.         Marland Kitchen doxycycline (VIBRAMYCIN) 50 MG capsule   Oral   Take 2 capsules (100 mg total) by mouth 2 (two) times daily.   14 capsule   0     Allergies Cephalexin and Levaquin  Family History  Problem Relation Age of Onset  . Cancer Mother 3    non hodgkins lymphoma  . Cancer Father     neck, throat, lung  . Lung cancer Father   . Throat cancer Father   . Hypertension Sister   . Cancer Sister 91    Breast cancer dx'd 04/2011  . Hypertension Brother   . Breast cancer Maternal Aunt   . Liver cancer Maternal Uncle   . Cancer Maternal Grandmother     died 64, unknown cancer  . Cancer Maternal Grandfather     died in his 33s; unknown cancer  . Lung cancer Maternal Uncle   . Throat cancer Maternal Uncle   . Breast cancer Maternal Aunt   . Cancer Cousin     died under the age 61, unknown cancer  . Cancer Cousin     died age 52; unknown cancer    Social History History  Substance Use Topics  . Smoking status: Never Smoker   . Smokeless tobacco: Never Used  . Alcohol Use: Yes     Comment: 16  oz per day    Review of Systems Constitutional: Negative for fever. ENT: Negative for sore throat. Positive for injury to lip and 2 teeth. See history of present illness Cardiovascular: Negative for chest pain. Respiratory: Negative for shortness of breath. Gastrointestinal: Negative for abdominal pain, vomiting and diarrhea. Genitourinary: Negative for  dysuria. Musculoskeletal: Negative for back pain. Skin: Notable for lacerations. See history of present illness. Neurological: Negative for headaches   10-point ROS otherwise negative.  ____________________________________________   PHYSICAL EXAM:  VITAL SIGNS: ED Triage Vitals  Enc Vitals Group     BP 11/27/14 2235 118/68 mmHg     Pulse Rate 11/27/14 2235 65     Resp 11/27/14 2235 20     Temp 11/27/14 2235 98.1 F (36.7 C)     Temp Source 11/27/14 2235 Axillary     SpO2 11/27/14 2235 98 %     Weight 11/27/14 2235 172 lb (78.019 kg)     Height 11/27/14 2235 5\' 3"  (1.6 m)     Head Cir --      Peak Flow --      Pain Score 11/27/14 2244 7     Pain Loc --      Pain Edu? --      Excl. in [place]? --     Constitutional: Alert and oriented. Well appearing and in mild distress ENT   Head: Normocephalic and atraumatic, except for the mouth area.   Nose: No congestion/rhinnorhea.   Mouth/Throat:  The lower lip is notably swollen with a 3 cm laceration running lateral along the lip. This laceration appears fairly shallow, but due to the inflammation and swelling it is possible open a little bit. Teeth #8 and 9 are chipped. There is no instability to the maxilla she has no malocclusion and the jaw moves freely without pain. The chin is a little bit swollen and tender. There is a superficial oblique laceration on the chin proximally 3 cm below the lower lip. On close examination this laceration does not appear to communicate with the oral cavity or with the laceration of the lower lip above.  Cardiovascular: Normal rate, regular rhythm. Respiratory: Normal respiratory effort without tachypnea. Breath sounds are clear and equal bilaterally. No wheezes/rales/rhonchi. Gastrointestinal: Soft and nontender. No distention.  Back: There is no CVA tenderness. Musculoskeletal: Nontender with normal range of motion in all extremities.  No noted edema. Neurologic:  Normal speech and language.  No gross focal neurologic deficits are appreciated.  Skin:  Lacerations of the lower lip and the chin as noted above. Psychiatric: Mood and affect are normal. Speech and behavior are normal.  ____________________________________________     INITIAL IMPRESSION / ASSESSMENT AND PLAN / ED COURSE  ____________________________________________  _____________________________   PROCEDURES  Procedure(s) performed: Wound repair with wound adhesive.  The laceration of the chin which appeared superficial was explored and irrigated. It did not appear to communicate with the oral cavity or the other laceration. After irrigation the area was sealed with wound glue.  Critical Care performed: No  ____________________________________________   INITIAL IMPRESSION / ASSESSMENT AND PLAN / ED COURSE  Laceration to the chin and laceration to the lower lip. The patient and I had a prolonged conversation about the best tactic for the lower lip. Given the swelling of the lower lip and the length of time that the laceration had been open, I did not think that I would be able to approximate the edges and a beneficial way with any suturing. I  believe this injury will look better once the swelling of the lip has gone down some. It will heal by secondary intention. The patient agrees with this and is relieved not to be receiving stitches at this time.  As noted above, we explored the laceration on the chin. We closed that with Dermabond.  We discussed her dental injuries which will need to be addressed by a dentist. She'll follow-up with her dentist.  ____________________________________________   FINAL CLINICAL IMPRESSION(S) / ED DIAGNOSES  Final diagnoses:  Laceration of lip, initial encounter  Laceration of chin, initial encounter  Fall, initial encounter  Teeth problem      Ahmed Prima, MD 11/28/14 [phone]

## 2014-11-28 NOTE — Discharge Instructions (Signed)
We discussed the laceration T her lower lip. With the swelling present we did not think we would have very good closure. It will heal by secondary intention. The laceration on your chin was irrigated and closed with wound glue. Take doxycycline (antibiotic). Follow-up with your dentist for the chipped teeth. Follow-up with her regular doctor if any further concerns about the cuts. Return to the emergency department if you have any urgent concerns.  Laceration Care, Adult A laceration is a cut that goes through all layers of the skin. The cut goes into the tissue beneath the skin. HOME CARE For stitches (sutures) or staples:  Keep the cut clean and dry.  If you have a bandage (dressing), change it at least once a day. Change the bandage if it gets wet or dirty, or as told by your doctor.  Wash the cut with soap and water 2 times a day. Rinse the cut with water. Pat it dry with a clean towel.  Put a thin layer of medicated cream on the cut as told by your doctor.  You may shower after the first 24 hours. Do not soak the cut in water until the stitches are removed.  Only take medicines as told by your doctor.  Have your stitches or staples removed as told by your doctor. For skin adhesive strips:  Keep the cut clean and dry.  Do not get the strips wet. You may take a bath, but be careful to keep the cut dry.  If the cut gets wet, pat it dry with a clean towel.  The strips will fall off on their own. Do not remove the strips that are still stuck to the cut. For wound glue:  You may shower or take baths. Do not soak or scrub the cut. Do not swim. Avoid heavy sweating until the glue falls off on its own. After a shower or bath, pat the cut dry with a clean towel.  Do not put medicine on your cut until the glue falls off.  If you have a bandage, do not put tape over the glue.  Avoid lots of sunlight or tanning lamps until the glue falls off. Put sunscreen on the cut for the first year  to reduce your scar.  The glue will fall off on its own. Do not pick at the glue. You may need a tetanus shot if:  You cannot remember when you had your last tetanus shot.  You have never had a tetanus shot. If you need a tetanus shot and you choose not to have one, you may get tetanus. Sickness from tetanus can be serious. GET HELP RIGHT AWAY IF:   Your pain does not get better with medicine.  Your arm, hand, leg, or foot loses feeling (numbness) or changes color.  Your cut is bleeding.  Your joint feels weak, or you cannot use your joint.  You have painful lumps on your body.  Your cut is red, puffy (swollen), or painful.  You have a red line on the skin near the cut.  You have yellowish-white fluid (pus) coming from the cut.  You have a fever.  You have a bad smell coming from the cut or bandage.  Your cut breaks open before or after stitches are removed.  You notice something coming out of the cut, such as wood or glass.  You cannot move a finger or toe. MAKE SURE YOU:   Understand these instructions.  Will watch your condition.  Will get  help right away if you are not doing well or get worse. Document Released: 12/26/2007 Document Revised: 10/01/2011 Document Reviewed: 01/02/2011 [facility] Patient Information 2015 [place], [place]. This information is not intended to replace advice given to you by your health care provider. Make sure you discuss any questions you have with your health care provider.

## 2014-11-29 DIAGNOSIS — K011 Impacted teeth: Secondary | ICD-10-CM | POA: Diagnosis not present

## 2014-11-29 DIAGNOSIS — K010 Embedded teeth: Secondary | ICD-10-CM | POA: Diagnosis not present

## 2014-11-29 DIAGNOSIS — K006 Disturbances in tooth eruption: Secondary | ICD-10-CM | POA: Diagnosis not present

## 2014-11-29 DIAGNOSIS — R69 Illness, unspecified: Secondary | ICD-10-CM | POA: Diagnosis not present

## 2014-12-01 DIAGNOSIS — M25512 Pain in left shoulder: Secondary | ICD-10-CM | POA: Diagnosis not present

## 2014-12-01 DIAGNOSIS — G8929 Other chronic pain: Secondary | ICD-10-CM | POA: Diagnosis not present

## 2014-12-01 DIAGNOSIS — M542 Cervicalgia: Secondary | ICD-10-CM | POA: Diagnosis not present

## 2014-12-01 DIAGNOSIS — M545 Low back pain: Secondary | ICD-10-CM | POA: Diagnosis not present

## 2014-12-08 DIAGNOSIS — M9902 Segmental and somatic dysfunction of thoracic region: Secondary | ICD-10-CM | POA: Diagnosis not present

## 2014-12-08 DIAGNOSIS — M9901 Segmental and somatic dysfunction of cervical region: Secondary | ICD-10-CM | POA: Diagnosis not present

## 2014-12-08 DIAGNOSIS — M62830 Muscle spasm of back: Secondary | ICD-10-CM | POA: Diagnosis not present

## 2014-12-08 DIAGNOSIS — M5412 Radiculopathy, cervical region: Secondary | ICD-10-CM | POA: Diagnosis not present

## 2014-12-09 DIAGNOSIS — R69 Illness, unspecified: Secondary | ICD-10-CM | POA: Diagnosis not present

## 2014-12-09 DIAGNOSIS — K010 Embedded teeth: Secondary | ICD-10-CM | POA: Diagnosis not present

## 2014-12-09 DIAGNOSIS — K006 Disturbances in tooth eruption: Secondary | ICD-10-CM | POA: Diagnosis not present

## 2014-12-09 DIAGNOSIS — K011 Impacted teeth: Secondary | ICD-10-CM | POA: Diagnosis not present

## 2014-12-13 ENCOUNTER — Other Ambulatory Visit: Payer: Self-pay

## 2014-12-13 ENCOUNTER — Telehealth: Payer: Self-pay | Admitting: *Deleted

## 2014-12-13 DIAGNOSIS — C50919 Malignant neoplasm of unspecified site of unspecified female breast: Secondary | ICD-10-CM

## 2014-12-13 DIAGNOSIS — C773 Secondary and unspecified malignant neoplasm of axilla and upper limb lymph nodes: Principal | ICD-10-CM

## 2014-12-13 MED ORDER — TAMOXIFEN CITRATE 10 MG PO TABS: 10.0000 mg | ORAL_TABLET | Freq: Every day | ORAL | Status: DC

## 2014-12-13 NOTE — Telephone Encounter (Signed)
VM message from patient requesting refill for tamoxifen 10 mg 1 po daily #90 through [facility] mail order

## 2014-12-13 NOTE — Telephone Encounter (Signed)
Last OV 09/28/14. Next OV 04/04/15.  Chart reviewed.

## 2014-12-22 DIAGNOSIS — M62830 Muscle spasm of back: Secondary | ICD-10-CM | POA: Diagnosis not present

## 2014-12-22 DIAGNOSIS — M9902 Segmental and somatic dysfunction of thoracic region: Secondary | ICD-10-CM | POA: Diagnosis not present

## 2014-12-22 DIAGNOSIS — M9901 Segmental and somatic dysfunction of cervical region: Secondary | ICD-10-CM | POA: Diagnosis not present

## 2014-12-22 DIAGNOSIS — M5412 Radiculopathy, cervical region: Secondary | ICD-10-CM | POA: Diagnosis not present

## 2015-01-03 DIAGNOSIS — J019 Acute sinusitis, unspecified: Secondary | ICD-10-CM | POA: Diagnosis not present

## 2015-01-03 DIAGNOSIS — B9689 Other specified bacterial agents as the cause of diseases classified elsewhere: Secondary | ICD-10-CM | POA: Diagnosis not present

## 2015-01-04 DIAGNOSIS — G8929 Other chronic pain: Secondary | ICD-10-CM | POA: Diagnosis not present

## 2015-01-04 DIAGNOSIS — M542 Cervicalgia: Secondary | ICD-10-CM | POA: Diagnosis not present

## 2015-01-04 DIAGNOSIS — M150 Primary generalized (osteo)arthritis: Secondary | ICD-10-CM | POA: Diagnosis not present

## 2015-01-04 DIAGNOSIS — M25512 Pain in left shoulder: Secondary | ICD-10-CM | POA: Diagnosis not present

## 2015-01-20 DIAGNOSIS — M9902 Segmental and somatic dysfunction of thoracic region: Secondary | ICD-10-CM | POA: Diagnosis not present

## 2015-01-20 DIAGNOSIS — M62830 Muscle spasm of back: Secondary | ICD-10-CM | POA: Diagnosis not present

## 2015-01-20 DIAGNOSIS — M9901 Segmental and somatic dysfunction of cervical region: Secondary | ICD-10-CM | POA: Diagnosis not present

## 2015-01-20 DIAGNOSIS — M5412 Radiculopathy, cervical region: Secondary | ICD-10-CM | POA: Diagnosis not present

## 2015-02-07 DIAGNOSIS — M9901 Segmental and somatic dysfunction of cervical region: Secondary | ICD-10-CM | POA: Diagnosis not present

## 2015-02-07 DIAGNOSIS — M5412 Radiculopathy, cervical region: Secondary | ICD-10-CM | POA: Diagnosis not present

## 2015-02-07 DIAGNOSIS — M62830 Muscle spasm of back: Secondary | ICD-10-CM | POA: Diagnosis not present

## 2015-02-07 DIAGNOSIS — M9902 Segmental and somatic dysfunction of thoracic region: Secondary | ICD-10-CM | POA: Diagnosis not present

## 2015-03-01 DIAGNOSIS — K219 Gastro-esophageal reflux disease without esophagitis: Secondary | ICD-10-CM | POA: Diagnosis not present

## 2015-03-01 DIAGNOSIS — Z1211 Encounter for screening for malignant neoplasm of colon: Secondary | ICD-10-CM | POA: Diagnosis not present

## 2015-03-01 DIAGNOSIS — K5730 Diverticulosis of large intestine without perforation or abscess without bleeding: Secondary | ICD-10-CM | POA: Diagnosis not present

## 2015-03-03 DIAGNOSIS — K010 Embedded teeth: Secondary | ICD-10-CM | POA: Diagnosis not present

## 2015-03-03 DIAGNOSIS — K011 Impacted teeth: Secondary | ICD-10-CM | POA: Diagnosis not present

## 2015-03-03 DIAGNOSIS — K006 Disturbances in tooth eruption: Secondary | ICD-10-CM | POA: Diagnosis not present

## 2015-03-03 DIAGNOSIS — R69 Illness, unspecified: Secondary | ICD-10-CM | POA: Diagnosis not present

## 2015-03-08 DIAGNOSIS — M62830 Muscle spasm of back: Secondary | ICD-10-CM | POA: Diagnosis not present

## 2015-03-08 DIAGNOSIS — M9902 Segmental and somatic dysfunction of thoracic region: Secondary | ICD-10-CM | POA: Diagnosis not present

## 2015-03-08 DIAGNOSIS — M5412 Radiculopathy, cervical region: Secondary | ICD-10-CM | POA: Diagnosis not present

## 2015-03-08 DIAGNOSIS — M9901 Segmental and somatic dysfunction of cervical region: Secondary | ICD-10-CM | POA: Diagnosis not present

## 2015-04-04 ENCOUNTER — Encounter: Payer: Self-pay | Admitting: Hematology and Oncology

## 2015-04-04 ENCOUNTER — Telehealth: Payer: Self-pay | Admitting: Hematology and Oncology

## 2015-04-04 ENCOUNTER — Ambulatory Visit (HOSPITAL_BASED_OUTPATIENT_CLINIC_OR_DEPARTMENT_OTHER): Payer: Commercial Managed Care - HMO | Admitting: Hematology and Oncology

## 2015-04-04 VITALS — BP 112/53 | HR 78 | Temp 98.6°F | Resp 18 | Ht 63.0 in | Wt 172.0 lb

## 2015-04-04 DIAGNOSIS — C50912 Malignant neoplasm of unspecified site of left female breast: Secondary | ICD-10-CM | POA: Diagnosis not present

## 2015-04-04 DIAGNOSIS — C779 Secondary and unspecified malignant neoplasm of lymph node, unspecified: Secondary | ICD-10-CM | POA: Diagnosis not present

## 2015-04-04 NOTE — Progress Notes (Signed)
Patient Care Team: [name], MD as PCP - General (Family Medicine)  DIAGNOSIS: Breast cancer, left breast   Staging form: Breast, AJCC 6th Edition     Clinical: Stage IIA ([place], N1, MX) - Signed by [name], MD on 08/31/2013     Pathologic: No stage assigned - Unsigned   SUMMARY OF ONCOLOGIC HISTORY:   Breast cancer, left breast   12/28/2010 Surgery Bilateral mastectomies: Left: no residual cancer, 1/15 LN positive; Right: benign, 0/3 LN   01/29/2011 - 07/02/2011 Chemotherapy adjuvant chemotherapy following NSABP B. 49 clinical trial. She received 4 cycles of dose dense Adriamycin and Cytoxan followed by weekly single agent Taxol    07/31/2011 -  Anti-estrogen oral therapy tamoxifen 10 mg daily daily. A total of 5 years therapy is planned. She has also been on Aromasin and Arimidex in the past, and was unable to tolerate these     CHIEF COMPLIANT: follow-up on tamoxifen  INTERVAL HISTORY: Krystal [name] is a 64 year old with above-mentioned history of left breast cancer with one positive axillary lymph node who received adjuvant chemotherapy on NSABP B 49 clinical trial and now on tamoxifen because she could not tolerate aromatase inhibitor therapy. She is tolerating 10 mg of tamoxifen moderately well. She does have hot flashes and severe sweats which seem to limit her significantly. She will have to change clothes each time it happens.but she has gotten used to it at this time. She had breast reconstruction and it appears of the right breast is sagging down. Previously she had surgery to fix it. But it appears to be happening again. This time she does not want to do anything about it.   REVIEW OF SYSTEMS:   Constitutional: Denies fevers, chills or abnormal weight loss Eyes: Denies blurriness of vision Ears, nose, mouth, throat, and face: Denies mucositis or sore throat Respiratory: Denies cough, dyspnea or wheezes Cardiovascular: Denies palpitation, chest discomfort or lower extremity  swelling Gastrointestinal:  Denies nausea, heartburn or change in bowel habits Skin: Denies abnormal skin rashes Lymphatics: Denies new lymphadenopathy or easy bruising Neurological:Denies numbness, tingling or new weaknesses Behavioral/Psych: Mood is stable, no new changes  Breast:  Sagging of the right breast reconstructed All other systems were reviewed with the patient and are negative.  I have reviewed the past medical history, past surgical history, social history and family history with the patient and they are unchanged from previous note.  ALLERGIES:  is allergic to cephalexin and levaquin.  MEDICATIONS:  Current Outpatient Prescriptions  Medication Sig Dispense Refill  . meloxicam (MOBIC) 7.5 MG tablet TAKE 1 TABLET (7.5 MG TOTAL) BY MOUTH ONCE DAILY.    Marland Kitchen alendronate (FOSAMAX) 70 MG tablet Take 70 mg by mouth every 7 (seven) days. Take with a full glass of water on an empty stomach.     Marland Kitchen amLODipine (NORVASC) 10 MG tablet Take 10 mg by mouth daily.     Marland Kitchen atenolol (TENORMIN) 100 MG tablet Take 100 mg by mouth daily.      . calcium carbonate (OS-CAL) 600 MG TABS Take 600 mg by mouth 2 (two) times daily with a meal.    . carisoprodol (SOMA) 350 MG tablet Take 350 mg by mouth 2 (two) times daily.    . cholecalciferol (VITAMIN D) 1000 UNITS tablet Take 2,000 Units by mouth daily.     . furosemide (LASIX) 20 MG tablet Take 20 mg by mouth 2 (two) times daily.     Marland Kitchen lisinopril-hydrochlorothiazide (PRINZIDE,ZESTORETIC) 20-25 MG per  tablet Take 1 tablet by mouth daily.     Marland Kitchen LYSINE ACETATE PO Take by mouth.    . potassium chloride SA (K-DUR,KLOR-CON) 20 MEQ tablet Take 20 mEq by mouth 2 (two) times daily.    . pregabalin (LYRICA) 300 MG capsule Take 300 mg by mouth 2 (two) times daily.      . ranitidine (ZANTAC) 150 MG tablet Take 150 mg by mouth 2 (two) times daily.      Marland Kitchen rOPINIRole (REQUIP) 0.5 MG tablet Take 0.5 mg by mouth every evening.    . tamoxifen (NOLVADEX) 10 MG tablet Take  1 tablet (10 mg total) by mouth daily. 90 tablet 1  . zolpidem (AMBIEN) 10 MG tablet Take 10 mg by mouth at bedtime.     No current facility-administered medications for this visit.    PHYSICAL EXAMINATION: ECOG PERFORMANCE STATUS: 1 - Symptomatic but completely ambulatory  Filed Vitals:   04/04/15 1022  BP: 112/53  Pulse: 78  Temp: 98.6 F (37 C)  Resp: 18   Filed Weights   04/04/15 1022  Weight: 172 lb (78.019 kg)    GENERAL:alert, no distress and comfortable SKIN: skin color, texture, turgor are normal, no rashes or significant lesions EYES: normal, Conjunctiva are pink and non-injected, sclera clear OROPHARYNX:no exudate, no erythema and lips, buccal mucosa, and tongue normal  NECK: supple, thyroid normal size, non-tender, without nodularity LYMPH:  no palpable lymphadenopathy in the cervical, axillary or inguinal LUNGS: clear to auscultation and percussion with normal breathing effort HEART: regular rate & rhythm and no murmurs and no lower extremity edema ABDOMEN:abdomen soft, non-tender and normal bowel sounds Musculoskeletal:no cyanosis of digits and no clubbing  NEURO: alert & oriented x 3 with fluent speech, no focal motor/sensory deficits BREASTbilateral breast reconstruction. No lymphadenopathy. No palpable lumps or nodules surrounding the reconstructed area.(exam performed in the presence of a chaperone)  LABORATORY DATA:  I have reviewed the data as listed   Chemistry      Component Value Date/Time   NA 138 09/28/2014 0924   NA 137 06/02/2013 1230   K 3.5 09/28/2014 0924   K 4.2 06/02/2013 1230   CL 98 06/02/2013 1230   CL 102 11/17/2012 0937   CO2 28 09/28/2014 0924   CO2 29 06/02/2013 1230   BUN 22.9 09/28/2014 0924   BUN 12 06/02/2013 1230   CREATININE 1.3* 09/28/2014 0924   CREATININE 0.90 06/02/2013 1230      Component Value Date/Time   CALCIUM 9.0 09/28/2014 0924   CALCIUM 9.4 06/02/2013 1230   ALKPHOS 51 09/28/2014 0924   ALKPHOS 95  02/08/2012 1003   AST 25 09/28/2014 0924   AST 19 02/08/2012 1003   ALT 28 09/28/2014 0924   ALT 16 02/08/2012 1003   BILITOT 0.60 09/28/2014 0924   BILITOT 0.6 02/08/2012 1003       Lab Results  Component Value Date   WBC 8.8 09/28/2014   HGB 15.6 09/28/2014   HCT 46.2 09/28/2014   MCV 91.8 09/28/2014   PLT 286 09/28/2014   NEUTROABS 5.6 09/28/2014   ASSESSMENT & PLAN:  Breast cancer, left breast stage II high-grade invasive ductal carcinoma with a positive lymph node patient is status post bilateral mastectomy followed by adjuvant chemotherapy following NSABP B. 49 clinical study.  Current treatment: Tamoxifen 20 mg daily since January 2013 Tamoxifen toxicities: no major toxicities tamoxifen, previously she was intolerant to aromatase inhibitors, she is on 10 mg of tamoxifen. Major side effect: Severe sweating  and hot flashes once a day. Bone mineral density T score of -1.3 mild osteopenia Once she completes 5 years of therapy, we will plan to do breast cancer index to determine if she would benefit from extended adjuvant therapy.  Breast cancer surveillance: 1. Chest wall and axillary examination 04/04/2015 is normal 2. There is no role of imaging since she had bilateral mastectomies  Patient had breast reconstruction and the right side appears to be sagging but she does not want to do anything about it.  Return to clinic in 1 year for follow-up  No orders of the defined types were placed in this encounter.   The patient has a good understanding of the overall plan. she agrees with it. she will call with any problems that may develop before the next visit here.   Rulon Eisenmenger, MD

## 2015-04-04 NOTE — Telephone Encounter (Signed)
Appointments made and avs printed for patient °

## 2015-04-04 NOTE — Assessment & Plan Note (Signed)
stage II high-grade invasive ductal carcinoma with a positive lymph node patient is status post bilateral mastectomy followed by adjuvant chemotherapy following NSABP B. 49 clinical study.  Current treatment: Tamoxifen 20 mg daily since January 2013 Tamoxifen toxicities: no major toxicities tamoxifen, previously she was intolerant to aromatase inhibitors, she is on 10 mg of tamoxifen. Major side effect: Severe sweating and hot flashes once a day. Bone mineral density T score of -1.3 mild osteopenia Once she completes 5 years of therapy, we will plan to do breast cancer index to determine if she would benefit from extended adjuvant therapy.  Breast cancer surveillance: 1. Chest wall and axillary examination 04/04/2015 is normal 2. There is no role of imaging since she had bilateral mastectomies  Patient had breast reconstruction and the right side appears to be sagging but she does not want to do anything about it.  Return to clinic in 1 year for follow-up

## 2015-04-05 DIAGNOSIS — M9901 Segmental and somatic dysfunction of cervical region: Secondary | ICD-10-CM | POA: Diagnosis not present

## 2015-04-05 DIAGNOSIS — M9902 Segmental and somatic dysfunction of thoracic region: Secondary | ICD-10-CM | POA: Diagnosis not present

## 2015-04-05 DIAGNOSIS — M5412 Radiculopathy, cervical region: Secondary | ICD-10-CM | POA: Diagnosis not present

## 2015-04-05 DIAGNOSIS — M62830 Muscle spasm of back: Secondary | ICD-10-CM | POA: Diagnosis not present

## 2015-04-06 DIAGNOSIS — Z1211 Encounter for screening for malignant neoplasm of colon: Secondary | ICD-10-CM | POA: Diagnosis not present

## 2015-04-06 DIAGNOSIS — K5730 Diverticulosis of large intestine without perforation or abscess without bleeding: Secondary | ICD-10-CM | POA: Diagnosis not present

## 2015-05-03 DIAGNOSIS — M5412 Radiculopathy, cervical region: Secondary | ICD-10-CM | POA: Diagnosis not present

## 2015-05-03 DIAGNOSIS — M9902 Segmental and somatic dysfunction of thoracic region: Secondary | ICD-10-CM | POA: Diagnosis not present

## 2015-05-03 DIAGNOSIS — M9901 Segmental and somatic dysfunction of cervical region: Secondary | ICD-10-CM | POA: Diagnosis not present

## 2015-05-03 DIAGNOSIS — M62830 Muscle spasm of back: Secondary | ICD-10-CM | POA: Diagnosis not present

## 2015-05-23 ENCOUNTER — Encounter: Payer: Self-pay | Admitting: Nutrition

## 2015-05-23 NOTE — Progress Notes (Signed)
Patient contacted me requesting recipes from "[employer]ing with [employer]" cooking demonstration class. I mailed these to her today.

## 2015-05-25 ENCOUNTER — Other Ambulatory Visit: Payer: Self-pay | Admitting: Hematology and Oncology

## 2015-05-25 NOTE — Telephone Encounter (Signed)
Chart reviewed.

## 2015-05-31 DIAGNOSIS — N651 Disproportion of reconstructed breast: Secondary | ICD-10-CM | POA: Diagnosis not present

## 2015-05-31 DIAGNOSIS — M9902 Segmental and somatic dysfunction of thoracic region: Secondary | ICD-10-CM | POA: Diagnosis not present

## 2015-05-31 DIAGNOSIS — Z853 Personal history of malignant neoplasm of breast: Secondary | ICD-10-CM | POA: Diagnosis not present

## 2015-05-31 DIAGNOSIS — M62830 Muscle spasm of back: Secondary | ICD-10-CM | POA: Diagnosis not present

## 2015-05-31 DIAGNOSIS — Z9889 Other specified postprocedural states: Secondary | ICD-10-CM | POA: Diagnosis not present

## 2015-05-31 DIAGNOSIS — M9901 Segmental and somatic dysfunction of cervical region: Secondary | ICD-10-CM | POA: Diagnosis not present

## 2015-05-31 DIAGNOSIS — M5412 Radiculopathy, cervical region: Secondary | ICD-10-CM | POA: Diagnosis not present

## 2015-06-08 DIAGNOSIS — B9689 Other specified bacterial agents as the cause of diseases classified elsewhere: Secondary | ICD-10-CM | POA: Diagnosis not present

## 2015-06-08 DIAGNOSIS — J019 Acute sinusitis, unspecified: Secondary | ICD-10-CM | POA: Diagnosis not present

## 2015-06-28 DIAGNOSIS — M62830 Muscle spasm of back: Secondary | ICD-10-CM | POA: Diagnosis not present

## 2015-06-28 DIAGNOSIS — M9902 Segmental and somatic dysfunction of thoracic region: Secondary | ICD-10-CM | POA: Diagnosis not present

## 2015-06-28 DIAGNOSIS — M5412 Radiculopathy, cervical region: Secondary | ICD-10-CM | POA: Diagnosis not present

## 2015-06-28 DIAGNOSIS — M9901 Segmental and somatic dysfunction of cervical region: Secondary | ICD-10-CM | POA: Diagnosis not present

## 2015-07-26 DIAGNOSIS — M5412 Radiculopathy, cervical region: Secondary | ICD-10-CM | POA: Diagnosis not present

## 2015-07-26 DIAGNOSIS — M9901 Segmental and somatic dysfunction of cervical region: Secondary | ICD-10-CM | POA: Diagnosis not present

## 2015-07-26 DIAGNOSIS — M62830 Muscle spasm of back: Secondary | ICD-10-CM | POA: Diagnosis not present

## 2015-07-26 DIAGNOSIS — M9902 Segmental and somatic dysfunction of thoracic region: Secondary | ICD-10-CM | POA: Diagnosis not present

## 2015-08-30 DIAGNOSIS — M5412 Radiculopathy, cervical region: Secondary | ICD-10-CM | POA: Diagnosis not present

## 2015-08-30 DIAGNOSIS — M9901 Segmental and somatic dysfunction of cervical region: Secondary | ICD-10-CM | POA: Diagnosis not present

## 2015-08-30 DIAGNOSIS — M62830 Muscle spasm of back: Secondary | ICD-10-CM | POA: Diagnosis not present

## 2015-08-30 DIAGNOSIS — M9902 Segmental and somatic dysfunction of thoracic region: Secondary | ICD-10-CM | POA: Diagnosis not present

## 2015-09-27 DIAGNOSIS — M62830 Muscle spasm of back: Secondary | ICD-10-CM | POA: Diagnosis not present

## 2015-09-27 DIAGNOSIS — M5412 Radiculopathy, cervical region: Secondary | ICD-10-CM | POA: Diagnosis not present

## 2015-09-27 DIAGNOSIS — M9902 Segmental and somatic dysfunction of thoracic region: Secondary | ICD-10-CM | POA: Diagnosis not present

## 2015-09-27 DIAGNOSIS — M9901 Segmental and somatic dysfunction of cervical region: Secondary | ICD-10-CM | POA: Diagnosis not present

## 2015-09-28 DIAGNOSIS — G2581 Restless legs syndrome: Secondary | ICD-10-CM | POA: Diagnosis not present

## 2015-09-28 DIAGNOSIS — L989 Disorder of the skin and subcutaneous tissue, unspecified: Secondary | ICD-10-CM | POA: Diagnosis not present

## 2015-09-28 DIAGNOSIS — I10 Essential (primary) hypertension: Secondary | ICD-10-CM | POA: Diagnosis not present

## 2015-09-28 DIAGNOSIS — E785 Hyperlipidemia, unspecified: Secondary | ICD-10-CM | POA: Diagnosis not present

## 2015-09-28 DIAGNOSIS — Z23 Encounter for immunization: Secondary | ICD-10-CM | POA: Diagnosis not present

## 2015-10-18 DIAGNOSIS — J0100 Acute maxillary sinusitis, unspecified: Secondary | ICD-10-CM | POA: Diagnosis not present

## 2015-10-18 DIAGNOSIS — J302 Other seasonal allergic rhinitis: Secondary | ICD-10-CM | POA: Diagnosis not present

## 2015-10-25 DIAGNOSIS — M5412 Radiculopathy, cervical region: Secondary | ICD-10-CM | POA: Diagnosis not present

## 2015-10-25 DIAGNOSIS — M9901 Segmental and somatic dysfunction of cervical region: Secondary | ICD-10-CM | POA: Diagnosis not present

## 2015-10-25 DIAGNOSIS — M62830 Muscle spasm of back: Secondary | ICD-10-CM | POA: Diagnosis not present

## 2015-10-25 DIAGNOSIS — M9902 Segmental and somatic dysfunction of thoracic region: Secondary | ICD-10-CM | POA: Diagnosis not present

## 2015-11-23 DIAGNOSIS — M9902 Segmental and somatic dysfunction of thoracic region: Secondary | ICD-10-CM | POA: Diagnosis not present

## 2015-11-23 DIAGNOSIS — M9901 Segmental and somatic dysfunction of cervical region: Secondary | ICD-10-CM | POA: Diagnosis not present

## 2015-11-23 DIAGNOSIS — M5412 Radiculopathy, cervical region: Secondary | ICD-10-CM | POA: Diagnosis not present

## 2015-11-23 DIAGNOSIS — M62830 Muscle spasm of back: Secondary | ICD-10-CM | POA: Diagnosis not present

## 2015-11-28 DIAGNOSIS — H2512 Age-related nuclear cataract, left eye: Secondary | ICD-10-CM | POA: Diagnosis not present

## 2015-11-29 ENCOUNTER — Telehealth: Payer: Self-pay | Admitting: Oncology

## 2015-11-29 NOTE — Telephone Encounter (Signed)
Called patient on the phone for a phone follow-up to the B-49 study.  Patient is doing good.  No problems except for hot flashes that she thinks is Tamoxifen related. Just saw her primary care physician for a physical and everything is good.  The patient will see Dr. Lindi Adie in Sept 2017. Remer Macho 11/29/15 - 2:35 pm

## 2015-12-01 DIAGNOSIS — D2271 Melanocytic nevi of right lower limb, including hip: Secondary | ICD-10-CM | POA: Diagnosis not present

## 2015-12-01 DIAGNOSIS — X32XXXA Exposure to sunlight, initial encounter: Secondary | ICD-10-CM | POA: Diagnosis not present

## 2015-12-01 DIAGNOSIS — D225 Melanocytic nevi of trunk: Secondary | ICD-10-CM | POA: Diagnosis not present

## 2015-12-01 DIAGNOSIS — D2272 Melanocytic nevi of left lower limb, including hip: Secondary | ICD-10-CM | POA: Diagnosis not present

## 2015-12-01 DIAGNOSIS — L570 Actinic keratosis: Secondary | ICD-10-CM | POA: Diagnosis not present

## 2015-12-01 DIAGNOSIS — L718 Other rosacea: Secondary | ICD-10-CM | POA: Diagnosis not present

## 2015-12-20 DIAGNOSIS — M62830 Muscle spasm of back: Secondary | ICD-10-CM | POA: Diagnosis not present

## 2015-12-20 DIAGNOSIS — M9902 Segmental and somatic dysfunction of thoracic region: Secondary | ICD-10-CM | POA: Diagnosis not present

## 2015-12-20 DIAGNOSIS — M9901 Segmental and somatic dysfunction of cervical region: Secondary | ICD-10-CM | POA: Diagnosis not present

## 2015-12-20 DIAGNOSIS — M5412 Radiculopathy, cervical region: Secondary | ICD-10-CM | POA: Diagnosis not present

## 2016-01-17 DIAGNOSIS — M5412 Radiculopathy, cervical region: Secondary | ICD-10-CM | POA: Diagnosis not present

## 2016-01-17 DIAGNOSIS — M62830 Muscle spasm of back: Secondary | ICD-10-CM | POA: Diagnosis not present

## 2016-01-17 DIAGNOSIS — M9901 Segmental and somatic dysfunction of cervical region: Secondary | ICD-10-CM | POA: Diagnosis not present

## 2016-01-17 DIAGNOSIS — M9902 Segmental and somatic dysfunction of thoracic region: Secondary | ICD-10-CM | POA: Diagnosis not present

## 2016-02-13 DIAGNOSIS — M5412 Radiculopathy, cervical region: Secondary | ICD-10-CM | POA: Diagnosis not present

## 2016-02-13 DIAGNOSIS — M62830 Muscle spasm of back: Secondary | ICD-10-CM | POA: Diagnosis not present

## 2016-02-13 DIAGNOSIS — M9901 Segmental and somatic dysfunction of cervical region: Secondary | ICD-10-CM | POA: Diagnosis not present

## 2016-02-13 DIAGNOSIS — M9902 Segmental and somatic dysfunction of thoracic region: Secondary | ICD-10-CM | POA: Diagnosis not present

## 2016-02-17 ENCOUNTER — Other Ambulatory Visit: Payer: Self-pay

## 2016-02-17 DIAGNOSIS — Z853 Personal history of malignant neoplasm of breast: Secondary | ICD-10-CM

## 2016-02-17 NOTE — Progress Notes (Signed)
Received request from Dr. Kary Kos with [facility] who is pt's PCP for [place] to obtain labs/UA during her next visit with [place] so that these results are available for upcoming physical.  Orders entered per request for 04/03/16 appt.

## 2016-03-13 DIAGNOSIS — M5412 Radiculopathy, cervical region: Secondary | ICD-10-CM | POA: Diagnosis not present

## 2016-03-13 DIAGNOSIS — M62830 Muscle spasm of back: Secondary | ICD-10-CM | POA: Diagnosis not present

## 2016-03-13 DIAGNOSIS — M9901 Segmental and somatic dysfunction of cervical region: Secondary | ICD-10-CM | POA: Diagnosis not present

## 2016-03-13 DIAGNOSIS — M9902 Segmental and somatic dysfunction of thoracic region: Secondary | ICD-10-CM | POA: Diagnosis not present

## 2016-03-29 DIAGNOSIS — M150 Primary generalized (osteo)arthritis: Secondary | ICD-10-CM | POA: Diagnosis not present

## 2016-03-29 DIAGNOSIS — G8929 Other chronic pain: Secondary | ICD-10-CM | POA: Diagnosis not present

## 2016-03-29 DIAGNOSIS — M25561 Pain in right knee: Secondary | ICD-10-CM | POA: Diagnosis not present

## 2016-03-29 DIAGNOSIS — M1811 Unilateral primary osteoarthritis of first carpometacarpal joint, right hand: Secondary | ICD-10-CM | POA: Diagnosis not present

## 2016-03-29 DIAGNOSIS — R609 Edema, unspecified: Secondary | ICD-10-CM | POA: Diagnosis not present

## 2016-03-30 ENCOUNTER — Telehealth: Payer: Self-pay | Admitting: Hematology and Oncology

## 2016-03-30 NOTE — Telephone Encounter (Signed)
Lvm to inform pt of r/s appt per [facility] due to upcoming weather condition

## 2016-04-03 ENCOUNTER — Ambulatory Visit: Payer: Commercial Managed Care - HMO | Admitting: Hematology and Oncology

## 2016-04-03 ENCOUNTER — Other Ambulatory Visit: Payer: Commercial Managed Care - HMO

## 2016-04-06 ENCOUNTER — Other Ambulatory Visit (HOSPITAL_BASED_OUTPATIENT_CLINIC_OR_DEPARTMENT_OTHER): Payer: Commercial Managed Care - HMO

## 2016-04-06 ENCOUNTER — Ambulatory Visit (HOSPITAL_BASED_OUTPATIENT_CLINIC_OR_DEPARTMENT_OTHER): Payer: Commercial Managed Care - HMO | Admitting: Hematology and Oncology

## 2016-04-06 ENCOUNTER — Encounter: Payer: Self-pay | Admitting: Hematology and Oncology

## 2016-04-06 VITALS — BP 142/82 | HR 68 | Temp 99.3°F | Resp 18 | Ht 63.0 in | Wt 179.6 lb

## 2016-04-06 DIAGNOSIS — M8588 Other specified disorders of bone density and structure, other site: Secondary | ICD-10-CM

## 2016-04-06 DIAGNOSIS — M797 Fibromyalgia: Secondary | ICD-10-CM | POA: Diagnosis not present

## 2016-04-06 DIAGNOSIS — C50512 Malignant neoplasm of lower-outer quadrant of left female breast: Secondary | ICD-10-CM

## 2016-04-06 DIAGNOSIS — C779 Secondary and unspecified malignant neoplasm of lymph node, unspecified: Secondary | ICD-10-CM

## 2016-04-06 DIAGNOSIS — C50212 Malignant neoplasm of upper-inner quadrant of left female breast: Secondary | ICD-10-CM

## 2016-04-06 DIAGNOSIS — Z0000 Encounter for general adult medical examination without abnormal findings: Secondary | ICD-10-CM

## 2016-04-06 DIAGNOSIS — Z853 Personal history of malignant neoplasm of breast: Secondary | ICD-10-CM

## 2016-04-06 LAB — URINALYSIS, MICROSCOPIC - CHCC
Bilirubin (Urine): NEGATIVE
Blood: NEGATIVE
Glucose: NEGATIVE mg/dL
KETONES: NEGATIVE mg/dL
Leukocyte Esterase: NEGATIVE
Nitrite: NEGATIVE
PROTEIN: NEGATIVE mg/dL
Specific Gravity, Urine: 1.005 (ref 1.003–1.035)
UROBILINOGEN UR: 0.2 mg/dL (ref 0.2–1)
pH: 7 (ref 4.6–8.0)

## 2016-04-06 LAB — CBC WITH DIFFERENTIAL/PLATELET
BASO%: 1 % (ref 0.0–2.0)
BASOS ABS: 0.1 10*3/uL (ref 0.0–0.1)
EOS ABS: 0.3 10*3/uL (ref 0.0–0.5)
EOS%: 4.2 % (ref 0.0–7.0)
HCT: 43.8 % (ref 34.8–46.6)
HEMOGLOBIN: 14.7 g/dL (ref 11.6–15.9)
LYMPH%: 30 % (ref 14.0–49.7)
MCH: 31.2 pg (ref 25.1–34.0)
MCHC: 33.4 g/dL (ref 31.5–36.0)
MCV: 93.3 fL (ref 79.5–101.0)
MONO#: 0.5 10*3/uL (ref 0.1–0.9)
MONO%: 7.9 % (ref 0.0–14.0)
NEUT#: 3.6 10*3/uL (ref 1.5–6.5)
NEUT%: 56.9 % (ref 38.4–76.8)
Platelets: 267 10*3/uL (ref 145–400)
RBC: 4.7 10*6/uL (ref 3.70–5.45)
RDW: 13.3 % (ref 11.2–14.5)
WBC: 6.4 10*3/uL (ref 3.9–10.3)
lymph#: 1.9 10*3/uL (ref 0.9–3.3)

## 2016-04-06 LAB — COMPREHENSIVE METABOLIC PANEL
ALT: 23 U/L (ref 0–55)
AST: 21 U/L (ref 5–34)
Albumin: 3.8 g/dL (ref 3.5–5.0)
Alkaline Phosphatase: 61 U/L (ref 40–150)
Anion Gap: 11 mEq/L (ref 3–11)
BILIRUBIN TOTAL: 0.52 mg/dL (ref 0.20–1.20)
BUN: 14.4 mg/dL (ref 7.0–26.0)
CO2: 29 meq/L (ref 22–29)
Calcium: 9.4 mg/dL (ref 8.4–10.4)
Chloride: 102 mEq/L (ref 98–109)
Creatinine: 0.9 mg/dL (ref 0.6–1.1)
EGFR: 69 mL/min/{1.73_m2} — AB (ref >90)
GLUCOSE: 98 mg/dL (ref 70–140)
Potassium: 4 mEq/L (ref 3.5–5.1)
SODIUM: 142 meq/L (ref 136–145)
TOTAL PROTEIN: 7.2 g/dL (ref 6.4–8.3)

## 2016-04-06 NOTE — Addendum Note (Signed)
Addended by: Prentiss Bells on: 04/06/2016 05:20 PM   Modules accepted: Orders

## 2016-04-06 NOTE — Assessment & Plan Note (Signed)
stage II high-grade invasive ductal carcinoma with a positive lymph node patient is status post bilateral mastectomy followed by adjuvant chemotherapy following NSABP B. 49 clinical study.  Current treatment: Tamoxifen 20 mg daily since January 2013 Tamoxifen toxicities: no major toxicities tamoxifen, previously she was intolerant to aromatase inhibitors, she is on 10 mg of tamoxifen. Major side effect: Severe sweating and hot flashes once a day. Bone mineral density T score of -1.3 mild osteopenia we will plan to do breast cancer index to determine if she would benefit from extended adjuvant therapy.  Breast cancer surveillance: 1. Chest wall and axillary examination 04/04/2015 is normal 2. There is no role of imaging since she had bilateral mastectomies  Patient had breast reconstruction and the right side appears to be sagging but she does not want to do anything about it.  Return to clinic in 1 year for follow-up

## 2016-04-06 NOTE — Progress Notes (Signed)
Patient Care Team: [name], MD as PCP - General (Family Medicine)  DIAGNOSIS: Breast cancer of lower-outer quadrant of left female breast [facility])   Staging form: Breast, AJCC 6th Edition   - Clinical: Stage IIA ([place], N1, MX) - Signed by [name], MD on 08/31/2013   - Pathologic: No stage assigned - Unsigned  SUMMARY OF ONCOLOGIC HISTORY:   Breast cancer of lower-outer quadrant of left female breast ([place])   12/28/2010 Surgery    Bilateral mastectomies: Left: no residual cancer, 1/15 LN positive; Right: benign, 0/3 LN      01/29/2011 - 07/02/2011 Chemotherapy    adjuvant chemotherapy following NSABP B. 49 clinical trial. She received 4 cycles of dose dense Adriamycin and Cytoxan followed by weekly single agent Taxol       07/31/2011 -  Anti-estrogen oral therapy    tamoxifen 10 mg daily daily. A total of 5 years therapy is planned. She has also been on Aromasin and Arimidex in the past, and was unable to tolerate these        CHIEF COMPLIANT: Follow-up on tamoxifen therapy  INTERVAL HISTORY: Krystal [name] is a 65 year old with above-mentioned history of left breast cancer currently on adjuvant tamoxifen. She completes 5 years of tamoxifen by January 2018. She continues to have diffuse muscle aches and pains as well as hot flashes. She has problems with fibromyalgia. Denies any lumps or nodules in breast.  REVIEW OF SYSTEMS:   Constitutional: Denies fevers, chills or abnormal weight loss Eyes: Denies blurriness of vision Ears, nose, mouth, throat, and face: Denies mucositis or sore throat Respiratory: Denies cough, dyspnea or wheezes Cardiovascular: Denies palpitation, chest discomfort Gastrointestinal:  Denies nausea, heartburn or change in bowel habits Skin: Denies abnormal skin rashes Lymphatics: Denies new lymphadenopathy or easy bruising Neurological:Denies numbness, tingling or new weaknesses Behavioral/Psych: Mood is stable, no new changes  Extremities: No lower  extremity edema Breast:  denies any pain or lumps or nodules in either breasts All other systems were reviewed with the patient and are negative.  I have reviewed the past medical history, past surgical history, social history and family history with the patient and they are unchanged from previous note.  ALLERGIES:  is allergic to cephalexin and levaquin [levofloxacin].  MEDICATIONS:  Current Outpatient Prescriptions  Medication Sig Dispense Refill  . alendronate (FOSAMAX) 70 MG tablet Take 70 mg by mouth every 7 (seven) days. Take with a full glass of water on an empty stomach.     Marland Kitchen amLODipine (NORVASC) 10 MG tablet Take 10 mg by mouth daily.     Marland Kitchen atenolol (TENORMIN) 100 MG tablet Take 100 mg by mouth daily.      . calcium carbonate (OS-CAL) 600 MG TABS Take 600 mg by mouth 2 (two) times daily with a meal.    . carisoprodol (SOMA) 350 MG tablet Take 350 mg by mouth 2 (two) times daily.    . cholecalciferol (VITAMIN D) 1000 UNITS tablet Take 2,000 Units by mouth daily.     . furosemide (LASIX) 20 MG tablet Take 20 mg by mouth 2 (two) times daily.     Marland Kitchen lisinopril-hydrochlorothiazide (PRINZIDE,ZESTORETIC) 20-25 MG per tablet Take 1 tablet by mouth daily.     Marland Kitchen LYSINE ACETATE PO Take by mouth.    . meloxicam (MOBIC) 7.5 MG tablet TAKE 1 TABLET (7.5 MG TOTAL) BY MOUTH ONCE DAILY.    Marland Kitchen potassium chloride SA (K-DUR,KLOR-CON) 20 MEQ tablet Take 20 mEq by mouth 2 (two)  times daily.    . pregabalin (LYRICA) 300 MG capsule Take 300 mg by mouth 2 (two) times daily.      . ranitidine (ZANTAC) 150 MG tablet Take 150 mg by mouth 2 (two) times daily.      Marland Kitchen rOPINIRole (REQUIP) 0.5 MG tablet Take 0.5 mg by mouth every evening.    . tamoxifen (NOLVADEX) 10 MG tablet TAKE 1 TABLET EVERY DAY 90 tablet 63  . zolpidem (AMBIEN) 10 MG tablet Take 10 mg by mouth at bedtime.     No current facility-administered medications for this visit.     PHYSICAL EXAMINATION: ECOG PERFORMANCE STATUS: 1 - Symptomatic  but completely ambulatory  Vitals:   04/06/16 1005  BP: (!) 142/82  Pulse: 68  Resp: 18  Temp: 99.3 F (37.4 C)   Filed Weights   04/06/16 1005  Weight: 179 lb 9.6 oz (81.5 kg)    GENERAL:alert, no distress and comfortable SKIN: skin color, texture, turgor are normal, no rashes or significant lesions EYES: normal, Conjunctiva are pink and non-injected, sclera clear OROPHARYNX:no exudate, no erythema and lips, buccal mucosa, and tongue normal  NECK: supple, thyroid normal size, non-tender, without nodularity LYMPH:  no palpable lymphadenopathy in the cervical, axillary or inguinal LUNGS: clear to auscultation and percussion with normal breathing effort HEART: regular rate & rhythm and no murmurs and no lower extremity edema ABDOMEN:abdomen soft, non-tender and normal bowel sounds MUSCULOSKELETAL:no cyanosis of digits and no clubbing  NEURO: alert & oriented x 3 with fluent speech, no focal motor/sensory deficits EXTREMITIES: No lower extremity edema BREAST: Bilateral breast reconstructions. (exam performed in the presence of a chaperone)  LABORATORY DATA:  I have reviewed the data as listed   Chemistry      Component Value Date/Time   NA 138 09/28/2014 0924   K 3.5 09/28/2014 0924   CL 98 06/02/2013 1230   CL 102 11/17/2012 0937   CO2 28 09/28/2014 0924   BUN 22.9 09/28/2014 0924   CREATININE 1.3 (H) 09/28/2014 0924      Component Value Date/Time   CALCIUM 9.0 09/28/2014 0924   ALKPHOS 51 09/28/2014 0924   AST 25 09/28/2014 0924   ALT 28 09/28/2014 0924   BILITOT 0.60 09/28/2014 0924       Lab Results  Component Value Date   WBC 6.4 04/06/2016   HGB 14.7 04/06/2016   HCT 43.8 04/06/2016   MCV 93.3 04/06/2016   PLT 267 04/06/2016   NEUTROABS 3.6 04/06/2016     ASSESSMENT & PLAN:  Breast cancer of lower-outer quadrant of left female breast ([place]) stage II high-grade invasive ductal carcinoma with a positive lymph node patient is status post bilateral  mastectomy followed by adjuvant chemotherapy following NSABP B. 49 clinical study.  Current treatment: Tamoxifen 20 mg daily since January 2013 Tamoxifen toxicities: no major toxicities tamoxifen, previously she was intolerant to aromatase inhibitors, she is on 10 mg of tamoxifen. Major side effect: Severe sweating and hot flashes once a day. Bone mineral density T score of -1.3 mild osteopenia we will plan to do breast cancer index to determine if she would benefit from extended adjuvant therapy.  Breast cancer surveillance: 1. Chest wall and axillary examination 04/04/2015 is normal 2. There is no role of imaging since she had bilateral mastectomies  Patient had breast reconstruction bilaterally  Return to clinic in 1 year for follow-up   Orders Placed This Encounter  Procedures  . Amb Referral to Survivorship Long term  Referral Priority:   Routine    Referral Type:   Consultation    Number of Visits Requested:   1   The patient has a good understanding of the overall plan. she agrees with it. she will call with any problems that may develop before the next visit here.   Rulon Eisenmenger, MD 04/06/16

## 2016-04-10 ENCOUNTER — Telehealth: Payer: Self-pay | Admitting: *Deleted

## 2016-04-10 DIAGNOSIS — Z0000 Encounter for general adult medical examination without abnormal findings: Secondary | ICD-10-CM | POA: Diagnosis not present

## 2016-04-10 DIAGNOSIS — M1990 Unspecified osteoarthritis, unspecified site: Secondary | ICD-10-CM | POA: Diagnosis not present

## 2016-04-10 DIAGNOSIS — M8580 Other specified disorders of bone density and structure, unspecified site: Secondary | ICD-10-CM | POA: Diagnosis not present

## 2016-04-10 DIAGNOSIS — I83813 Varicose veins of bilateral lower extremities with pain: Secondary | ICD-10-CM | POA: Diagnosis not present

## 2016-04-10 DIAGNOSIS — L98499 Non-pressure chronic ulcer of skin of other sites with unspecified severity: Secondary | ICD-10-CM | POA: Diagnosis not present

## 2016-04-10 DIAGNOSIS — I10 Essential (primary) hypertension: Secondary | ICD-10-CM | POA: Diagnosis not present

## 2016-04-10 NOTE — Telephone Encounter (Signed)
Received order for BCI Testing. Requisition sent to pathology and [facility]

## 2016-04-11 DIAGNOSIS — M5412 Radiculopathy, cervical region: Secondary | ICD-10-CM | POA: Diagnosis not present

## 2016-04-11 DIAGNOSIS — M9901 Segmental and somatic dysfunction of cervical region: Secondary | ICD-10-CM | POA: Diagnosis not present

## 2016-04-11 DIAGNOSIS — M62830 Muscle spasm of back: Secondary | ICD-10-CM | POA: Diagnosis not present

## 2016-04-11 DIAGNOSIS — M9902 Segmental and somatic dysfunction of thoracic region: Secondary | ICD-10-CM | POA: Diagnosis not present

## 2016-04-13 ENCOUNTER — Telehealth: Payer: Self-pay | Admitting: *Deleted

## 2016-04-13 NOTE — Telephone Encounter (Signed)
Received notification from [facility] that there was insufficient tissue for BCI testing. Informed lab to cancel testing. Dr. Lindi Adie notified.

## 2016-04-16 DIAGNOSIS — M79609 Pain in unspecified limb: Secondary | ICD-10-CM | POA: Diagnosis not present

## 2016-04-16 DIAGNOSIS — I739 Peripheral vascular disease, unspecified: Secondary | ICD-10-CM | POA: Diagnosis not present

## 2016-04-16 DIAGNOSIS — I8311 Varicose veins of right lower extremity with inflammation: Secondary | ICD-10-CM | POA: Diagnosis not present

## 2016-04-16 DIAGNOSIS — I10 Essential (primary) hypertension: Secondary | ICD-10-CM | POA: Diagnosis not present

## 2016-04-16 DIAGNOSIS — M7989 Other specified soft tissue disorders: Secondary | ICD-10-CM | POA: Diagnosis not present

## 2016-04-16 DIAGNOSIS — I872 Venous insufficiency (chronic) (peripheral): Secondary | ICD-10-CM | POA: Diagnosis not present

## 2016-04-16 DIAGNOSIS — I8312 Varicose veins of left lower extremity with inflammation: Secondary | ICD-10-CM | POA: Diagnosis not present

## 2016-04-16 DIAGNOSIS — E785 Hyperlipidemia, unspecified: Secondary | ICD-10-CM | POA: Diagnosis not present

## 2016-04-26 DIAGNOSIS — L03031 Cellulitis of right toe: Secondary | ICD-10-CM | POA: Diagnosis not present

## 2016-05-04 ENCOUNTER — Telehealth: Payer: Self-pay

## 2016-05-04 NOTE — Telephone Encounter (Signed)
Pt stopped by to get signature from Dr. Lindi Adie and while giving document to pt in lobby, pt requested BCI results.  Upon chart review, there was insufficient tissue to run BCI.  Per Dr. Lindi Adie, in scenarios such as this it is recommended for pt to continue on tamoxifen for a total of 10 years of therapy if pt is able to tolerate.  Discussed this with pt and offered to make her an appointment to discuss further with Dr. Lindi Adie; however, pt denied needing appointment and wishes to continue tamoxifen.  Pt stated she will contact our office if she has any further questions or concerns or wishes to discuss this further.

## 2016-05-10 DIAGNOSIS — M25511 Pain in right shoulder: Secondary | ICD-10-CM | POA: Diagnosis not present

## 2016-05-30 DIAGNOSIS — R0789 Other chest pain: Secondary | ICD-10-CM | POA: Diagnosis not present

## 2016-05-30 DIAGNOSIS — M542 Cervicalgia: Secondary | ICD-10-CM | POA: Diagnosis not present

## 2016-06-02 ENCOUNTER — Other Ambulatory Visit: Payer: Self-pay | Admitting: Hematology and Oncology

## 2016-06-04 ENCOUNTER — Other Ambulatory Visit: Payer: Self-pay

## 2016-06-04 MED ORDER — TAMOXIFEN CITRATE 10 MG PO TABS: 10.0000 mg | ORAL_TABLET | Freq: Every day | ORAL | 3 refills | Status: DC

## 2016-06-07 DIAGNOSIS — E785 Hyperlipidemia, unspecified: Secondary | ICD-10-CM | POA: Diagnosis not present

## 2016-06-07 DIAGNOSIS — R600 Localized edema: Secondary | ICD-10-CM | POA: Diagnosis not present

## 2016-06-07 DIAGNOSIS — I10 Essential (primary) hypertension: Secondary | ICD-10-CM | POA: Diagnosis not present

## 2016-06-07 DIAGNOSIS — R0602 Shortness of breath: Secondary | ICD-10-CM | POA: Diagnosis not present

## 2016-06-07 DIAGNOSIS — K219 Gastro-esophageal reflux disease without esophagitis: Secondary | ICD-10-CM | POA: Diagnosis not present

## 2016-06-07 DIAGNOSIS — I208 Other forms of angina pectoris: Secondary | ICD-10-CM | POA: Diagnosis not present

## 2016-06-07 DIAGNOSIS — R011 Cardiac murmur, unspecified: Secondary | ICD-10-CM | POA: Diagnosis not present

## 2016-06-07 DIAGNOSIS — D649 Anemia, unspecified: Secondary | ICD-10-CM | POA: Diagnosis not present

## 2016-06-07 DIAGNOSIS — R079 Chest pain, unspecified: Secondary | ICD-10-CM | POA: Diagnosis not present

## 2016-06-21 DIAGNOSIS — S161XXA Strain of muscle, fascia and tendon at neck level, initial encounter: Secondary | ICD-10-CM | POA: Diagnosis not present

## 2016-06-21 DIAGNOSIS — M7521 Bicipital tendinitis, right shoulder: Secondary | ICD-10-CM | POA: Diagnosis not present

## 2016-06-21 DIAGNOSIS — M7711 Lateral epicondylitis, right elbow: Secondary | ICD-10-CM | POA: Diagnosis not present

## 2016-06-25 ENCOUNTER — Other Ambulatory Visit: Payer: Self-pay | Admitting: Hematology and Oncology

## 2016-06-25 DIAGNOSIS — C50512 Malignant neoplasm of lower-outer quadrant of left female breast: Secondary | ICD-10-CM

## 2016-06-25 DIAGNOSIS — Z170 Estrogen receptor positive status [ER+]: Principal | ICD-10-CM

## 2016-06-26 ENCOUNTER — Other Ambulatory Visit: Payer: Self-pay

## 2016-06-27 ENCOUNTER — Telehealth: Payer: Self-pay | Admitting: *Deleted

## 2016-06-29 DIAGNOSIS — R0602 Shortness of breath: Secondary | ICD-10-CM | POA: Diagnosis not present

## 2016-06-29 DIAGNOSIS — R011 Cardiac murmur, unspecified: Secondary | ICD-10-CM | POA: Diagnosis not present

## 2016-06-29 DIAGNOSIS — I208 Other forms of angina pectoris: Secondary | ICD-10-CM | POA: Diagnosis not present

## 2016-07-02 DIAGNOSIS — M62830 Muscle spasm of back: Secondary | ICD-10-CM | POA: Diagnosis not present

## 2016-07-02 DIAGNOSIS — M9902 Segmental and somatic dysfunction of thoracic region: Secondary | ICD-10-CM | POA: Diagnosis not present

## 2016-07-02 DIAGNOSIS — M9901 Segmental and somatic dysfunction of cervical region: Secondary | ICD-10-CM | POA: Diagnosis not present

## 2016-07-02 DIAGNOSIS — M5412 Radiculopathy, cervical region: Secondary | ICD-10-CM | POA: Diagnosis not present

## 2016-07-03 DIAGNOSIS — I10 Essential (primary) hypertension: Secondary | ICD-10-CM | POA: Diagnosis not present

## 2016-07-03 DIAGNOSIS — E785 Hyperlipidemia, unspecified: Secondary | ICD-10-CM | POA: Diagnosis not present

## 2016-07-03 DIAGNOSIS — I208 Other forms of angina pectoris: Secondary | ICD-10-CM | POA: Diagnosis not present

## 2016-07-03 DIAGNOSIS — R0602 Shortness of breath: Secondary | ICD-10-CM | POA: Diagnosis not present

## 2016-07-03 DIAGNOSIS — D649 Anemia, unspecified: Secondary | ICD-10-CM | POA: Diagnosis not present

## 2016-07-03 DIAGNOSIS — R011 Cardiac murmur, unspecified: Secondary | ICD-10-CM | POA: Diagnosis not present

## 2016-07-03 DIAGNOSIS — R600 Localized edema: Secondary | ICD-10-CM | POA: Diagnosis not present

## 2016-07-03 DIAGNOSIS — K219 Gastro-esophageal reflux disease without esophagitis: Secondary | ICD-10-CM | POA: Diagnosis not present

## 2016-07-03 DIAGNOSIS — R079 Chest pain, unspecified: Secondary | ICD-10-CM | POA: Diagnosis not present

## 2016-07-04 DIAGNOSIS — M9901 Segmental and somatic dysfunction of cervical region: Secondary | ICD-10-CM | POA: Diagnosis not present

## 2016-07-04 DIAGNOSIS — M5412 Radiculopathy, cervical region: Secondary | ICD-10-CM | POA: Diagnosis not present

## 2016-07-04 DIAGNOSIS — M62830 Muscle spasm of back: Secondary | ICD-10-CM | POA: Diagnosis not present

## 2016-07-04 DIAGNOSIS — M9902 Segmental and somatic dysfunction of thoracic region: Secondary | ICD-10-CM | POA: Diagnosis not present

## 2016-07-09 DIAGNOSIS — M5412 Radiculopathy, cervical region: Secondary | ICD-10-CM | POA: Diagnosis not present

## 2016-07-09 DIAGNOSIS — M62830 Muscle spasm of back: Secondary | ICD-10-CM | POA: Diagnosis not present

## 2016-07-09 DIAGNOSIS — M9901 Segmental and somatic dysfunction of cervical region: Secondary | ICD-10-CM | POA: Diagnosis not present

## 2016-07-09 DIAGNOSIS — M9902 Segmental and somatic dysfunction of thoracic region: Secondary | ICD-10-CM | POA: Diagnosis not present

## 2016-07-12 DIAGNOSIS — M5412 Radiculopathy, cervical region: Secondary | ICD-10-CM | POA: Diagnosis not present

## 2016-07-12 DIAGNOSIS — M9901 Segmental and somatic dysfunction of cervical region: Secondary | ICD-10-CM | POA: Diagnosis not present

## 2016-07-12 DIAGNOSIS — M9902 Segmental and somatic dysfunction of thoracic region: Secondary | ICD-10-CM | POA: Diagnosis not present

## 2016-07-12 DIAGNOSIS — M62830 Muscle spasm of back: Secondary | ICD-10-CM | POA: Diagnosis not present

## 2016-07-24 ENCOUNTER — Other Ambulatory Visit (INDEPENDENT_AMBULATORY_CARE_PROVIDER_SITE_OTHER): Payer: Self-pay | Admitting: Vascular Surgery

## 2016-07-24 DIAGNOSIS — I8390 Asymptomatic varicose veins of unspecified lower extremity: Secondary | ICD-10-CM

## 2016-07-24 DIAGNOSIS — M79605 Pain in left leg: Secondary | ICD-10-CM

## 2016-07-24 DIAGNOSIS — M79604 Pain in right leg: Secondary | ICD-10-CM

## 2016-07-24 DIAGNOSIS — I739 Peripheral vascular disease, unspecified: Secondary | ICD-10-CM

## 2016-07-24 DIAGNOSIS — M7989 Other specified soft tissue disorders: Secondary | ICD-10-CM

## 2016-07-25 ENCOUNTER — Ambulatory Visit (INDEPENDENT_AMBULATORY_CARE_PROVIDER_SITE_OTHER): Payer: Commercial Managed Care - HMO

## 2016-07-25 ENCOUNTER — Encounter (INDEPENDENT_AMBULATORY_CARE_PROVIDER_SITE_OTHER): Payer: Self-pay

## 2016-07-25 ENCOUNTER — Ambulatory Visit (INDEPENDENT_AMBULATORY_CARE_PROVIDER_SITE_OTHER): Payer: Self-pay | Admitting: Vascular Surgery

## 2016-07-25 DIAGNOSIS — M7989 Other specified soft tissue disorders: Secondary | ICD-10-CM

## 2016-07-25 DIAGNOSIS — I8390 Asymptomatic varicose veins of unspecified lower extremity: Secondary | ICD-10-CM

## 2016-07-25 DIAGNOSIS — M79605 Pain in left leg: Secondary | ICD-10-CM

## 2016-07-25 DIAGNOSIS — M79604 Pain in right leg: Secondary | ICD-10-CM

## 2016-07-25 DIAGNOSIS — I739 Peripheral vascular disease, unspecified: Secondary | ICD-10-CM

## 2016-07-30 ENCOUNTER — Telehealth (INDEPENDENT_AMBULATORY_CARE_PROVIDER_SITE_OTHER): Payer: Self-pay | Admitting: Vascular Surgery

## 2016-07-30 NOTE — Telephone Encounter (Signed)
Called patient to discuss ultrasound results. Patient last seen on 04/16/16 with chief complaint of pain and swelling in her bilateral lower extremity. Over the last three months, despite conservative therapy including exercise, elevation and class one compression stockings the patient still presents with class one lymphedema.  ABI: Normal bilaterally Venous Duplex: No DVT or SVT bilaterally. Small area of reflux in left GSV.   Since failing conservative therapy. Patient would like to move forward with a lymphedema pump.

## 2016-08-03 DIAGNOSIS — M7581 Other shoulder lesions, right shoulder: Secondary | ICD-10-CM | POA: Diagnosis not present

## 2016-08-03 DIAGNOSIS — M1711 Unilateral primary osteoarthritis, right knee: Secondary | ICD-10-CM | POA: Diagnosis not present

## 2016-08-03 DIAGNOSIS — M7521 Bicipital tendinitis, right shoulder: Secondary | ICD-10-CM | POA: Diagnosis not present

## 2016-08-15 DIAGNOSIS — M25561 Pain in right knee: Secondary | ICD-10-CM | POA: Diagnosis not present

## 2016-08-15 DIAGNOSIS — G8929 Other chronic pain: Secondary | ICD-10-CM | POA: Diagnosis not present

## 2016-08-15 DIAGNOSIS — M1711 Unilateral primary osteoarthritis, right knee: Secondary | ICD-10-CM | POA: Diagnosis not present

## 2016-08-17 DIAGNOSIS — G8929 Other chronic pain: Secondary | ICD-10-CM | POA: Diagnosis not present

## 2016-08-17 DIAGNOSIS — M25511 Pain in right shoulder: Secondary | ICD-10-CM | POA: Diagnosis not present

## 2016-08-20 DIAGNOSIS — G8929 Other chronic pain: Secondary | ICD-10-CM | POA: Diagnosis not present

## 2016-08-20 DIAGNOSIS — M25511 Pain in right shoulder: Secondary | ICD-10-CM | POA: Diagnosis not present

## 2016-08-22 DIAGNOSIS — M1711 Unilateral primary osteoarthritis, right knee: Secondary | ICD-10-CM | POA: Diagnosis not present

## 2016-08-23 DIAGNOSIS — M25511 Pain in right shoulder: Secondary | ICD-10-CM | POA: Diagnosis not present

## 2016-08-23 DIAGNOSIS — G8929 Other chronic pain: Secondary | ICD-10-CM | POA: Diagnosis not present

## 2016-08-27 DIAGNOSIS — G8929 Other chronic pain: Secondary | ICD-10-CM | POA: Diagnosis not present

## 2016-08-27 DIAGNOSIS — M25511 Pain in right shoulder: Secondary | ICD-10-CM | POA: Diagnosis not present

## 2016-08-28 DIAGNOSIS — I890 Lymphedema, not elsewhere classified: Secondary | ICD-10-CM | POA: Diagnosis not present

## 2016-08-29 DIAGNOSIS — M1711 Unilateral primary osteoarthritis, right knee: Secondary | ICD-10-CM | POA: Diagnosis not present

## 2016-08-31 DIAGNOSIS — G8929 Other chronic pain: Secondary | ICD-10-CM | POA: Diagnosis not present

## 2016-08-31 DIAGNOSIS — M25511 Pain in right shoulder: Secondary | ICD-10-CM | POA: Diagnosis not present

## 2016-09-04 DIAGNOSIS — M25511 Pain in right shoulder: Secondary | ICD-10-CM | POA: Diagnosis not present

## 2016-09-04 DIAGNOSIS — R29898 Other symptoms and signs involving the musculoskeletal system: Secondary | ICD-10-CM | POA: Diagnosis not present

## 2016-09-06 DIAGNOSIS — M25511 Pain in right shoulder: Secondary | ICD-10-CM | POA: Diagnosis not present

## 2016-09-06 DIAGNOSIS — G8929 Other chronic pain: Secondary | ICD-10-CM | POA: Diagnosis not present

## 2017-01-14 DIAGNOSIS — H2512 Age-related nuclear cataract, left eye: Secondary | ICD-10-CM | POA: Diagnosis not present

## 2017-03-26 ENCOUNTER — Telehealth: Payer: Self-pay

## 2017-03-26 NOTE — Telephone Encounter (Signed)
Patient called to center and asked if we could call Dr. Barbarann Ehlers office to get lab orders that he needs so we can draw them here during appt on 04/01/17.  Call placed to office and note left with nurse.  Will fax orders to [place] if any needed or call with questions.  Fax and phone numbers given.

## 2017-03-27 ENCOUNTER — Other Ambulatory Visit: Payer: Self-pay

## 2017-03-27 DIAGNOSIS — C50512 Malignant neoplasm of lower-outer quadrant of left female breast: Secondary | ICD-10-CM

## 2017-03-27 DIAGNOSIS — Z170 Estrogen receptor positive status [ER+]: Principal | ICD-10-CM

## 2017-04-01 ENCOUNTER — Ambulatory Visit (HOSPITAL_BASED_OUTPATIENT_CLINIC_OR_DEPARTMENT_OTHER): Payer: Commercial Managed Care - HMO

## 2017-04-01 ENCOUNTER — Ambulatory Visit (HOSPITAL_BASED_OUTPATIENT_CLINIC_OR_DEPARTMENT_OTHER): Payer: Medicare HMO | Admitting: Adult Health

## 2017-04-01 ENCOUNTER — Encounter: Payer: Self-pay | Admitting: Adult Health

## 2017-04-01 ENCOUNTER — Other Ambulatory Visit: Payer: Self-pay

## 2017-04-01 VITALS — BP 130/99 | HR 78 | Temp 98.1°F | Resp 18 | Wt 187.2 lb

## 2017-04-01 DIAGNOSIS — C50512 Malignant neoplasm of lower-outer quadrant of left female breast: Secondary | ICD-10-CM

## 2017-04-01 DIAGNOSIS — Z170 Estrogen receptor positive status [ER+]: Principal | ICD-10-CM

## 2017-04-01 DIAGNOSIS — Z853 Personal history of malignant neoplasm of breast: Secondary | ICD-10-CM | POA: Diagnosis not present

## 2017-04-01 DIAGNOSIS — Z79811 Long term (current) use of aromatase inhibitors: Secondary | ICD-10-CM

## 2017-04-01 LAB — COMPREHENSIVE METABOLIC PANEL
ALT: 41 U/L (ref 0–55)
AST: 43 U/L — AB (ref 5–34)
Albumin: 4.1 g/dL (ref 3.5–5.0)
Alkaline Phosphatase: 57 U/L (ref 40–150)
Anion Gap: 13 mEq/L — ABNORMAL HIGH (ref 3–11)
BUN: 9.1 mg/dL (ref 7.0–26.0)
CHLORIDE: 101 meq/L (ref 98–109)
CO2: 27 meq/L (ref 22–29)
Calcium: 9.4 mg/dL (ref 8.4–10.4)
Creatinine: 1 mg/dL (ref 0.6–1.1)
EGFR: 60 mL/min/{1.73_m2} — AB (ref >90)
GLUCOSE: 105 mg/dL (ref 70–140)
POTASSIUM: 3.3 meq/L — AB (ref 3.5–5.1)
SODIUM: 141 meq/L (ref 136–145)
Total Bilirubin: 0.64 mg/dL (ref 0.20–1.20)
Total Protein: 7.3 g/dL (ref 6.4–8.3)

## 2017-04-01 LAB — CBC WITH DIFFERENTIAL/PLATELET
BASO%: 0.7 % (ref 0.0–2.0)
BASOS ABS: 0 10*3/uL (ref 0.0–0.1)
EOS ABS: 0.4 10*3/uL (ref 0.0–0.5)
EOS%: 7.6 % — AB (ref 0.0–7.0)
HCT: 42.3 % (ref 34.8–46.6)
HGB: 14.3 g/dL (ref 11.6–15.9)
LYMPH%: 35.9 % (ref 14.0–49.7)
MCH: 31.8 pg (ref 25.1–34.0)
MCHC: 33.8 g/dL (ref 31.5–36.0)
MCV: 94.2 fL (ref 79.5–101.0)
MONO#: 0.5 10*3/uL (ref 0.1–0.9)
MONO%: 9 % (ref 0.0–14.0)
NEUT#: 2.7 10*3/uL (ref 1.5–6.5)
NEUT%: 46.8 % (ref 38.4–76.8)
Platelets: 219 10*3/uL (ref 145–400)
RBC: 4.49 10*6/uL (ref 3.70–5.45)
RDW: 13.5 % (ref 11.2–14.5)
WBC: 5.8 10*3/uL (ref 3.9–10.3)
lymph#: 2.1 10*3/uL (ref 0.9–3.3)

## 2017-04-01 NOTE — Progress Notes (Signed)
CLINIC:  Survivorship   REASON FOR VISIT:  Routine follow-up for history of breast cancer.   BRIEF ONCOLOGIC HISTORY:    Breast cancer of lower-outer quadrant of left female breast ([place])   12/28/2010 Surgery    Bilateral mastectomies: Left: no residual cancer, 1/15 LN positive; Right: benign, 0/3 LN      01/29/2011 - 07/02/2011 Chemotherapy    adjuvant chemotherapy following NSABP B. 49 clinical trial. She received 4 cycles of dose dense Adriamycin and Cytoxan followed by weekly single agent Taxol       07/31/2011 -  Anti-estrogen oral therapy    tamoxifen 10 mg daily daily. A total of 5 years therapy is planned. She has also been on Aromasin and Arimidex in the past, and was unable to tolerate these         INTERVAL HISTORY:  Krystal [name] presents to the [facility] today for routine follow-up for her history of breast cancer.  Overall, she reports feeling quite well. She continues to take the Tamoxifen 74m daily.  She underwent BCI testing and her results were inconclusive due to insufficient tissue to test.  She does continue to experience hot flashes, and really does not like taking the Tamoxifen.  She wants to know if she should continue to take it.      REVIEW OF SYSTEMS:  Review of Systems  Constitutional: Negative for appetite change, chills, fatigue and fever.  HENT:   Negative for hearing loss and lump/mass.   Eyes: Negative for eye problems and icterus.  Respiratory: Negative for chest tightness, cough and shortness of breath.   Cardiovascular: Positive for leg swelling. Negative for chest pain and palpitations.  Gastrointestinal: Negative for abdominal distention, abdominal pain, constipation, diarrhea, nausea and vomiting.  Endocrine: Positive for hot flashes.  Genitourinary: Negative for difficulty urinating.   Musculoskeletal: Negative for arthralgias.  Skin: Negative for itching and rash.  Neurological: Negative for dizziness, extremity weakness and  numbness.  Hematological: Negative for adenopathy. Does not bruise/bleed easily.  Psychiatric/Behavioral: Negative for depression.  Breast: Denies any new nodularity, masses, tenderness, nipple changes, or nipple discharge.       PAST MEDICAL/SURGICAL HISTORY:  Past Medical History:  Diagnosis Date  . Anemia   . Arthritis    osteo  . Blindness of right eye at [place]  [place]    [place]  . Cataract   . Depression   . Edema 07/27/2011  . Fibromyalgia    muscle weakness and pain  . Hyperlipidemia   . Hypertension    benign  . Osteoporosis   . Plantar fasciitis   . Restless leg syndrome   . Rosacea   . Rotator cuff rupture   . Synovitis   . Ulcer ([facility]    Past Surgical History:  Procedure Laterality Date  . ABDOMINAL HYSTERECTOMY  1986   For bleeding and pain  . BLADDER SURGERY  2006   Bladder tack  . BREAST RECONSTRUCTION Bilateral 09/17/2012   Procedure: BREAST RECONSTRUCTION;  Surgeon: CTheodoro Kos DO;  [place]  Service: [place];  Laterality: Bilateral;  BILATERAL BREAST RECONSTRUCTION WITH TISSUE EXPANDERS AND ALLOMED  . BREAST RECONSTRUCTION Right 06/03/2013   Procedure: RIGHT BREAST CAPSULE CONSTRACTURE;  Surgeon: CTheodoro Kos DO;  [place]  Service: Plastics;  Laterality: Right;  . CARPAL TUNNEL RELEASE    . EYE SURGERY    . LIPOSUCTION Bilateral 01/29/2013   Procedure: LIPOSUCTION;  Surgeon: CTheodoro Kos DO;  Location: [place];  Service: Plastics;  Laterality: Bilateral;  . LIPOSUCTION Right 06/03/2013   Procedure: LIPOSUCTION;  Surgeon: Theodoro Kos, DO;  Location: [place];  Service: Plastics;  Laterality: Right;  . MASTECTOMY  2012   rt prophalactic mast-snbx  . MASTECTOMY MODIFIED RADICAL  2012   left-axillary nodes  . NEUROMA SURGERY    . NOSE SURGERY  2007  . PORT-A-CATH REMOVAL     insertion and  . THROAT SURGERY    . UVULOPALATOPHARYNGOPLASTY        ALLERGIES:  Allergies  Allergen Reactions  . Cephalexin Nausea Only    Other reaction(s): Diaphoresis / Sweating (intolerance)  . Levaquin [Levofloxacin] Other (See Comments)    Reaction:  Stiff joints, unable to walk.     CURRENT MEDICATIONS:  Outpatient Encounter Prescriptions as of 04/01/2017  Medication Sig Note  . alendronate (FOSAMAX) 70 MG tablet Take 70 mg by mouth every 7 (seven) days. Take with a full glass of water on an empty stomach.    Marland Kitchen amLODipine (NORVASC) 10 MG tablet Take 10 mg by mouth daily.    Marland Kitchen atenolol (TENORMIN) 100 MG tablet Take 100 mg by mouth daily.     . carisoprodol (SOMA) 350 MG tablet Take 350 mg by mouth 2 (two) times daily.   . fluticasone (FLONASE) 50 MCG/ACT nasal spray Place into both nostrils daily.   . furosemide (LASIX) 20 MG tablet Take 20 mg by mouth 2 (two) times daily.    Marland Kitchen lisinopril-hydrochlorothiazide (PRINZIDE,ZESTORETIC) 20-25 MG per tablet Take 1 tablet by mouth every other day.    Marland Kitchen LYSINE ACETATE PO Take by mouth. 04/04/2015: Received from: [place]  . magnesium oxide (MAG-OX) 400 MG tablet Take 400 mg by mouth 2 (two) times daily.   . meloxicam (MOBIC) 7.5 MG tablet TAKE 1 TABLET (7.5 MG TOTAL) BY MOUTH ONCE DAILY. 04/04/2015: Received from: [place]  . potassium chloride SA (K-DUR,KLOR-CON) 20 MEQ tablet Take 20 mEq by mouth 2 (two) times daily.   . pregabalin (LYRICA) 300 MG capsule Take 300 mg by mouth 2 (two) times daily.     . ranitidine (ZANTAC) 150 MG tablet Take 150 mg by mouth 2 (two) times daily.     Marland Kitchen rOPINIRole (REQUIP) 0.5 MG tablet Take 0.5 mg by mouth every evening.   . tamoxifen (NOLVADEX) 10 MG tablet Take 1 tablet (10 mg total) by mouth daily.   Marland Kitchen zolpidem (AMBIEN) 10 MG tablet Take 10 mg by mouth at bedtime.   . [DISCONTINUED] calcium carbonate (OS-CAL) 600 MG TABS Take 600 mg by mouth 2 (two) times daily with a meal.   . [DISCONTINUED] Cholecalciferol 1000 units  capsule Take 1,000 Units by mouth daily.    No facility-administered encounter medications on file as of 04/01/2017.      ONCOLOGIC FAMILY HISTORY:  Family History  Problem Relation Age of Onset  . Cancer Mother 95       non hodgkins lymphoma  . Cancer Father        neck, throat, lung  . Lung cancer Father   . Throat cancer Father   . Hypertension Sister   . Cancer Sister 38       Breast cancer dx'd 04/2011  . Hypertension Brother   . Breast cancer Maternal Aunt   . Liver cancer Maternal Uncle   . Cancer Maternal Grandmother        died 37, unknown cancer  .  Cancer Maternal Grandfather        died in his 3s; unknown cancer  . Lung cancer Maternal Uncle   . Throat cancer Maternal Uncle   . Breast cancer Maternal Aunt   . Cancer Cousin        died under the age 72, unknown cancer  . Cancer Cousin        died age 70; unknown cancer     PHYSICAL EXAMINATION:  Vital Signs: Vitals:   04/01/17 1332  BP: (!) 130/99  Pulse: 78  Resp: 18  Temp: 98.1 F (36.7 C)  SpO2: 98%   Filed Weights   04/01/17 1332  Weight: 187 lb 3.2 oz (84.9 kg)   General: Well-nourished, well-appearing female in no acute distress.  Unaccompaniedtoday.   HEENT: Head is normocephalic.  Pupils equal and reactive to light. Conjunctivae clear without exudate.  Sclerae anicteric. Oral mucosa is pink, moist.  Oropharynx is pink without lesions or erythema.  Lymph: No cervical, supraclavicular, or infraclavicular lymphadenopathy noted on palpation.  Cardiovascular: Regular rate and rhythm.Marland Kitchen Respiratory: Clear to auscultation bilaterally. Chest expansion symmetric; breathing non-labored.  Breast Exam:  S/p bilateral mastectomy with implant placement, no swelling, nodules, masses or any sign of recurrence -Axilla: No axillary adenopathy bilaterally.  GI: Abdomen soft and round; non-tender, non-distended. Bowel sounds normoactive. No hepatosplenomegaly.   GU: Deferred.  Neuro: No focal deficits. Steady  gait.  Psych: Mood and affect normal and appropriate for situation.  MSK: No focal spinal tenderness to palpation, full range of motion in bilateral upper extremities Extremities: No edema. Skin: Warm and dry.  LABORATORY DATA:  Appointment on 04/01/2017  Component Date Value Ref Range Status  . WBC 04/01/2017 5.8  3.9 - 10.3 10e3/uL Final  . NEUT# 04/01/2017 2.7  1.5 - 6.5 10e3/uL Final  . HGB 04/01/2017 14.3  11.6 - 15.9 g/dL Final  . HCT 04/01/2017 42.3  34.8 - 46.6 % Final  . Platelets 04/01/2017 219  145 - 400 10e3/uL Final  . MCV 04/01/2017 94.2  79.5 - 101.0 fL Final  . MCH 04/01/2017 31.8  25.1 - 34.0 pg Final  . MCHC 04/01/2017 33.8  31.5 - 36.0 g/dL Final  . RBC 04/01/2017 4.49  3.70 - 5.45 10e6/uL Final  . RDW 04/01/2017 13.5  11.2 - 14.5 % Final  . lymph# 04/01/2017 2.1  0.9 - 3.3 10e3/uL Final  . MONO# 04/01/2017 0.5  0.1 - 0.9 10e3/uL Final  . Eosinophils Absolute 04/01/2017 0.4  0.0 - 0.5 10e3/uL Final  . Basophils Absolute 04/01/2017 0.0  0.0 - 0.1 10e3/uL Final  . NEUT% 04/01/2017 46.8  38.4 - 76.8 % Final  . LYMPH% 04/01/2017 35.9  14.0 - 49.7 % Final  . MONO% 04/01/2017 9.0  0.0 - 14.0 % Final  . EOS% 04/01/2017 7.6* 0.0 - 7.0 % Final  . BASO% 04/01/2017 0.7  0.0 - 2.0 % Final  . Sodium 04/01/2017 141  136 - 145 mEq/L Final  . Potassium 04/01/2017 3.3* 3.5 - 5.1 mEq/L Final  . Chloride 04/01/2017 101  98 - 109 mEq/L Final  . CO2 04/01/2017 27  22 - 29 mEq/L Final  . Glucose 04/01/2017 105  70 - 140 mg/dl Final   Glucose reference range is for nonfasting patients. Fasting glucose reference range is 70- 100.  Marland Kitchen BUN 04/01/2017 9.1  7.0 - 26.0 mg/dL Final  . Creatinine 04/01/2017 1.0  0.6 - 1.1 mg/dL Final  . Total Bilirubin 04/01/2017 0.64  0.20 - 1.20  mg/dL Final  . Alkaline Phosphatase 04/01/2017 57  40 - 150 U/L Final  . AST 04/01/2017 43* 5 - 34 U/L Final  . ALT 04/01/2017 41  0 - 55 U/L Final  . Total Protein 04/01/2017 7.3  6.4 - 8.3 g/dL Final  .  Albumin 04/01/2017 4.1  3.5 - 5.0 g/dL Final  . Calcium 04/01/2017 9.4  8.4 - 10.4 mg/dL Final  . Anion Gap 04/01/2017 13* 3 - 11 mEq/L Final  . EGFR 04/01/2017 60* >90 ml/min/1.73 m2 Final   eGFR is calculated using the CKD-EPI Creatinine Equation (2009)     DIAGNOSTIC IMAGING:  Most recent mammogram:  N/a s/p bilateral mastectomy    ASSESSMENT AND PLAN:  Ms.. [name] is a pleasant 66 y.o. female with history of Stage IIA left breast invasive ductal carcinoma, ER+/PR+/HER2-, diagnosed in 12/2010, treated with bilateral mastectomy and axillary node dissection, adjuvant chemotherapy, and anti-estrogen therapy with Tamoxifen beginning in 07/2011 (reduced dose of 82m due to side effects).  [facility] for surveillance and routine follow-up.   1. History of breast cancer:  Ms. MGravelleis currently clinically without evidence of disease or recurrence of breast cancer. She will continue her anti-estrogen therapy with Tamoxifen, with plans to continue for 10 years.  She and I spent a while today discussing the Tamoxifen, and the possibility of stopping it.  Without the BCI testing, there is no way to know which category she is in with certainty.  I also told her that right now she needs to focus on her quality of life, but also, she needs to feel comfortable with her decision to stop Tamoxifen.  After a long discussion, she is going to continue the Tamoxifen for now.  I encouraged her to call me with any questions or concerns before her next visit at the cancer center, and I would be happy to see her sooner, if needed.    2. Bone health:  Given Ms. Paglia's age, history of breast cancer, she is at risk for bone demineralization. I counseled her that Tamoxifen likely has a protective effect on her bones. She was given education on specific food and activities to promote bone health.  I will defer to her PCP for future bone density testing and management.   3. Cancer screening:  Due  to Ms. Schanz's history and her age, she should receive screening for skin cancers and colon cancer. She was encouraged to follow-up with her PCP for appropriate cancer screenings.   4. Health maintenance and wellness promotion: Ms. MTapperwas encouraged to consume 5-7 servings of fruits and vegetables per day. She was also encouraged to engage in moderate to vigorous exercise for 30 minutes per day most days of the week. She was instructed to limit her alcohol consumption and continue to abstain from tobacco use.    Dispo:  -Return to cancer center in one year for LTS follow up   A total of (30) minutes of face-to-face time was spent with this patient with greater than 50% of that time in counseling and care-coordination.   LGardenia Phlegm [phone]  Note: PRIMARY CARE PROVIDER HMaryland Pink MPort [phone]

## 2017-04-02 LAB — HEMOGLOBIN A1C
ESTIMATED AVERAGE GLUCOSE: 120 mg/dL
HEMOGLOBIN A1C: 5.8 % — AB (ref 4.8–5.6)

## 2017-04-08 DIAGNOSIS — I10 Essential (primary) hypertension: Secondary | ICD-10-CM | POA: Diagnosis not present

## 2017-04-08 DIAGNOSIS — R600 Localized edema: Secondary | ICD-10-CM | POA: Diagnosis not present

## 2017-04-08 DIAGNOSIS — K589 Irritable bowel syndrome without diarrhea: Secondary | ICD-10-CM | POA: Diagnosis not present

## 2017-04-08 DIAGNOSIS — M1990 Unspecified osteoarthritis, unspecified site: Secondary | ICD-10-CM | POA: Diagnosis not present

## 2017-06-25 ENCOUNTER — Other Ambulatory Visit: Payer: Self-pay | Admitting: Hematology and Oncology

## 2017-07-02 DIAGNOSIS — J0100 Acute maxillary sinusitis, unspecified: Secondary | ICD-10-CM | POA: Diagnosis not present

## 2017-07-09 DIAGNOSIS — J0100 Acute maxillary sinusitis, unspecified: Secondary | ICD-10-CM | POA: Diagnosis not present

## 2017-08-16 DIAGNOSIS — B353 Tinea pedis: Secondary | ICD-10-CM | POA: Diagnosis not present

## 2017-09-30 DIAGNOSIS — F3342 Major depressive disorder, recurrent, in full remission: Secondary | ICD-10-CM | POA: Diagnosis not present

## 2017-09-30 DIAGNOSIS — Z0000 Encounter for general adult medical examination without abnormal findings: Secondary | ICD-10-CM | POA: Diagnosis not present

## 2017-09-30 DIAGNOSIS — L989 Disorder of the skin and subcutaneous tissue, unspecified: Secondary | ICD-10-CM | POA: Diagnosis not present

## 2017-09-30 DIAGNOSIS — E785 Hyperlipidemia, unspecified: Secondary | ICD-10-CM | POA: Diagnosis not present

## 2017-09-30 DIAGNOSIS — M25561 Pain in right knee: Secondary | ICD-10-CM | POA: Diagnosis not present

## 2017-09-30 DIAGNOSIS — M1990 Unspecified osteoarthritis, unspecified site: Secondary | ICD-10-CM | POA: Diagnosis not present

## 2017-09-30 DIAGNOSIS — I10 Essential (primary) hypertension: Secondary | ICD-10-CM | POA: Diagnosis not present

## 2017-10-09 DIAGNOSIS — M5412 Radiculopathy, cervical region: Secondary | ICD-10-CM | POA: Diagnosis not present

## 2017-10-09 DIAGNOSIS — M62830 Muscle spasm of back: Secondary | ICD-10-CM | POA: Diagnosis not present

## 2017-10-09 DIAGNOSIS — M9902 Segmental and somatic dysfunction of thoracic region: Secondary | ICD-10-CM | POA: Diagnosis not present

## 2017-10-09 DIAGNOSIS — M9901 Segmental and somatic dysfunction of cervical region: Secondary | ICD-10-CM | POA: Diagnosis not present

## 2017-10-16 DIAGNOSIS — D2272 Melanocytic nevi of left lower limb, including hip: Secondary | ICD-10-CM | POA: Diagnosis not present

## 2017-10-16 DIAGNOSIS — D2262 Melanocytic nevi of left upper limb, including shoulder: Secondary | ICD-10-CM | POA: Diagnosis not present

## 2017-10-16 DIAGNOSIS — D225 Melanocytic nevi of trunk: Secondary | ICD-10-CM | POA: Diagnosis not present

## 2017-10-16 DIAGNOSIS — D2261 Melanocytic nevi of right upper limb, including shoulder: Secondary | ICD-10-CM | POA: Diagnosis not present

## 2017-10-16 DIAGNOSIS — L281 Prurigo nodularis: Secondary | ICD-10-CM | POA: Diagnosis not present

## 2017-10-16 DIAGNOSIS — I781 Nevus, non-neoplastic: Secondary | ICD-10-CM | POA: Diagnosis not present

## 2017-10-16 DIAGNOSIS — M5412 Radiculopathy, cervical region: Secondary | ICD-10-CM | POA: Diagnosis not present

## 2017-10-16 DIAGNOSIS — L718 Other rosacea: Secondary | ICD-10-CM | POA: Diagnosis not present

## 2017-10-16 DIAGNOSIS — M62830 Muscle spasm of back: Secondary | ICD-10-CM | POA: Diagnosis not present

## 2017-10-16 DIAGNOSIS — M9901 Segmental and somatic dysfunction of cervical region: Secondary | ICD-10-CM | POA: Diagnosis not present

## 2017-10-16 DIAGNOSIS — M9902 Segmental and somatic dysfunction of thoracic region: Secondary | ICD-10-CM | POA: Diagnosis not present

## 2017-10-16 DIAGNOSIS — D2271 Melanocytic nevi of right lower limb, including hip: Secondary | ICD-10-CM | POA: Diagnosis not present

## 2017-10-16 DIAGNOSIS — L821 Other seborrheic keratosis: Secondary | ICD-10-CM | POA: Diagnosis not present

## 2017-11-12 DIAGNOSIS — M5412 Radiculopathy, cervical region: Secondary | ICD-10-CM | POA: Diagnosis not present

## 2017-11-12 DIAGNOSIS — M9901 Segmental and somatic dysfunction of cervical region: Secondary | ICD-10-CM | POA: Diagnosis not present

## 2017-11-12 DIAGNOSIS — M9902 Segmental and somatic dysfunction of thoracic region: Secondary | ICD-10-CM | POA: Diagnosis not present

## 2017-11-12 DIAGNOSIS — M62830 Muscle spasm of back: Secondary | ICD-10-CM | POA: Diagnosis not present

## 2017-11-27 DIAGNOSIS — M170 Bilateral primary osteoarthritis of knee: Secondary | ICD-10-CM | POA: Diagnosis not present

## 2017-12-10 DIAGNOSIS — M5412 Radiculopathy, cervical region: Secondary | ICD-10-CM | POA: Diagnosis not present

## 2017-12-10 DIAGNOSIS — M9902 Segmental and somatic dysfunction of thoracic region: Secondary | ICD-10-CM | POA: Diagnosis not present

## 2017-12-10 DIAGNOSIS — M9901 Segmental and somatic dysfunction of cervical region: Secondary | ICD-10-CM | POA: Diagnosis not present

## 2017-12-10 DIAGNOSIS — M62830 Muscle spasm of back: Secondary | ICD-10-CM | POA: Diagnosis not present

## 2018-02-28 DIAGNOSIS — H2512 Age-related nuclear cataract, left eye: Secondary | ICD-10-CM | POA: Diagnosis not present

## 2018-03-28 DIAGNOSIS — D1721 Benign lipomatous neoplasm of skin and subcutaneous tissue of right arm: Secondary | ICD-10-CM | POA: Diagnosis not present

## 2018-03-28 DIAGNOSIS — D1722 Benign lipomatous neoplasm of skin and subcutaneous tissue of left arm: Secondary | ICD-10-CM | POA: Diagnosis not present

## 2018-03-28 DIAGNOSIS — D692 Other nonthrombocytopenic purpura: Secondary | ICD-10-CM | POA: Diagnosis not present

## 2018-03-31 ENCOUNTER — Other Ambulatory Visit: Payer: Self-pay | Admitting: Adult Health

## 2018-03-31 DIAGNOSIS — C50512 Malignant neoplasm of lower-outer quadrant of left female breast: Secondary | ICD-10-CM

## 2018-03-31 DIAGNOSIS — Z170 Estrogen receptor positive status [ER+]: Principal | ICD-10-CM

## 2018-04-01 ENCOUNTER — Inpatient Hospital Stay: Payer: Medicare HMO

## 2018-04-01 ENCOUNTER — Encounter: Payer: Self-pay | Admitting: Adult Health

## 2018-04-01 ENCOUNTER — Ambulatory Visit (HOSPITAL_COMMUNITY)
Admission: RE | Admit: 2018-04-01 | Discharge: 2018-04-01 | Disposition: A | Discharge status: Home / Self Care | Payer: Medicare HMO | Source: Ambulatory Visit | Attending: Adult Health | Admitting: Adult Health

## 2018-04-01 ENCOUNTER — Inpatient Hospital Stay: Payer: Medicare HMO | Attending: Adult Health | Admitting: Adult Health

## 2018-04-01 VITALS — BP 106/54 | HR 76 | Temp 98.6°F | Resp 18 | Ht 63.0 in | Wt 185.7 lb

## 2018-04-01 DIAGNOSIS — C50512 Malignant neoplasm of lower-outer quadrant of left female breast: Secondary | ICD-10-CM | POA: Diagnosis not present

## 2018-04-01 DIAGNOSIS — Z9013 Acquired absence of bilateral breasts and nipples: Secondary | ICD-10-CM

## 2018-04-01 DIAGNOSIS — R062 Wheezing: Secondary | ICD-10-CM | POA: Insufficient documentation

## 2018-04-01 DIAGNOSIS — R059 Cough, unspecified: Secondary | ICD-10-CM

## 2018-04-01 DIAGNOSIS — R0602 Shortness of breath: Secondary | ICD-10-CM | POA: Diagnosis not present

## 2018-04-01 DIAGNOSIS — R05 Cough: Secondary | ICD-10-CM | POA: Insufficient documentation

## 2018-04-01 DIAGNOSIS — Z9221 Personal history of antineoplastic chemotherapy: Secondary | ICD-10-CM | POA: Diagnosis not present

## 2018-04-01 DIAGNOSIS — Z170 Estrogen receptor positive status [ER+]: Principal | ICD-10-CM

## 2018-04-01 DIAGNOSIS — R7303 Prediabetes: Secondary | ICD-10-CM

## 2018-04-01 DIAGNOSIS — Z13220 Encounter for screening for lipoid disorders: Secondary | ICD-10-CM

## 2018-04-01 DIAGNOSIS — Z79810 Long term (current) use of selective estrogen receptor modulators (SERMs): Secondary | ICD-10-CM | POA: Diagnosis not present

## 2018-04-01 LAB — CMP (CANCER CENTER ONLY)
ALT: 33 U/L (ref 0–44)
ANION GAP: 12 (ref 5–15)
AST: 38 U/L (ref 15–41)
Albumin: 3.7 g/dL (ref 3.5–5.0)
Alkaline Phosphatase: 62 U/L (ref 38–126)
BUN: 10 mg/dL (ref 8–23)
CHLORIDE: 100 mmol/L (ref 98–111)
CO2: 27 mmol/L (ref 22–32)
Calcium: 9.2 mg/dL (ref 8.9–10.3)
Creatinine: 1.05 mg/dL — ABNORMAL HIGH (ref 0.44–1.00)
GFR, Est AFR Am: >60 mL/min (ref >60)
GFR, Estimated: 55 mL/min — ABNORMAL LOW (ref >60)
GLUCOSE: 96 mg/dL (ref 70–99)
Potassium: 3.8 mmol/L (ref 3.5–5.1)
SODIUM: 139 mmol/L (ref 135–145)
TOTAL PROTEIN: 6.8 g/dL (ref 6.5–8.1)
Total Bilirubin: 0.5 mg/dL (ref 0.3–1.2)

## 2018-04-01 LAB — CBC WITH DIFFERENTIAL (CANCER CENTER ONLY)
BASOS PCT: 1 %
Basophils Absolute: 0.1 10*3/uL (ref 0.0–0.1)
EOS PCT: 4 %
Eosinophils Absolute: 0.3 10*3/uL (ref 0.0–0.5)
HEMATOCRIT: 41.2 % (ref 34.8–46.6)
Hemoglobin: 14.1 g/dL (ref 11.6–15.9)
Lymphocytes Relative: 27 %
Lymphs Abs: 1.7 10*3/uL (ref 0.9–3.3)
MCH: 33 pg (ref 25.1–34.0)
MCHC: 34.2 g/dL (ref 31.5–36.0)
MCV: 96.5 fL (ref 79.5–101.0)
MONO ABS: 0.6 10*3/uL (ref 0.1–0.9)
MONOS PCT: 9 %
NEUTROS ABS: 3.8 10*3/uL (ref 1.5–6.5)
Neutrophils Relative %: 59 %
Platelet Count: 253 10*3/uL (ref 145–400)
RBC: 4.27 MIL/uL (ref 3.70–5.45)
RDW: 14.3 % (ref 11.2–14.5)
WBC Count: 6.4 10*3/uL (ref 3.9–10.3)

## 2018-04-01 LAB — LIPID PANEL
CHOL/HDL RATIO: 3.6 ratio
Cholesterol: 199 mg/dL (ref 0–200)
HDL: 56 mg/dL (ref >40)
LDL Cholesterol: 116 mg/dL — ABNORMAL HIGH (ref 0–99)
Triglycerides: 136 mg/dL (ref <150)
VLDL: 27 mg/dL (ref 0–40)

## 2018-04-01 NOTE — Patient Instructions (Signed)
Bone Health Bones protect organs, store calcium, and anchor muscles. Good health habits, such as eating nutritious foods and exercising regularly, are important for maintaining healthy bones. They can also help to prevent a condition that causes bones to lose density and become weak and brittle (osteoporosis). Why is bone mass important? Bone mass refers to the amount of bone tissue that you have. The higher your bone mass, the stronger your bones. An important step toward having healthy bones throughout life is to have strong and dense bones during childhood. A young adult who has a high bone mass is more likely to have a high bone mass later in life. Bone mass at its greatest it is called peak bone mass. A large decline in bone mass occurs in older adults. In women, it occurs about the time of menopause. During this time, it is important to practice good health habits, because if more bone is lost than what is replaced, the bones will become less healthy and more likely to break (fracture). If you find that you have a low bone mass, you may be able to prevent osteoporosis or further bone loss by changing your diet and lifestyle. How can I find out if my bone mass is low? Bone mass can be measured with an X-ray test that is called a bone mineral density (BMD) test. This test is recommended for all women who are age 65 or older. It may also be recommended for men who are age 70 or older, or for people who are more likely to develop osteoporosis due to:  Having bones that break easily.  Having a long-term disease that weakens bones, such as kidney disease or rheumatoid arthritis.  Having menopause earlier than normal.  Taking medicine that weakens bones, such as steroids, thyroid hormones, or hormone treatment for breast cancer or prostate cancer.  Smoking.  Drinking three or more alcoholic drinks each day.  What are the nutritional recommendations for healthy bones? To have healthy bones, you  need to get enough of the right minerals and vitamins. Most nutrition experts recommend getting these nutrients from the foods that you eat. Nutritional recommendations vary from person to person. Ask your health care provider what is healthy for you. Here are some general guidelines. Calcium Recommendations Calcium is the most important (essential) mineral for bone health. Most people can get enough calcium from their diet, but supplements may be recommended for people who are at risk for osteoporosis. Good sources of calcium include:  Dairy products, such as low-fat or nonfat milk, cheese, and yogurt.  Dark green leafy vegetables, such as bok choy and broccoli.  Calcium-fortified foods, such as orange juice, cereal, bread, soy beverages, and tofu products.  Nuts, such as almonds.  Follow these recommended amounts for daily calcium intake:  Children, age 1?3: 700 mg.  Children, age 4?8: 1,000 mg.  Children, age 9?13: 1,300 mg.  Teens, age 14?18: 1,300 mg.  Adults, age 19?50: 1,000 mg.  Adults, age 51?70: ? Men: 1,000 mg. ? Women: 1,200 mg.  Adults, age 71 or older: 1,200 mg.  Pregnant and breastfeeding females: ? Teens: 1,300 mg. ? Adults: 1,000 mg.  Vitamin D Recommendations Vitamin D is the most essential vitamin for bone health. It helps the body to absorb calcium. Sunlight stimulates the skin to make vitamin D, so be sure to get enough sunlight. If you live in a cold climate or you do not get outside often, your health care provider may recommend that you take vitamin   D supplements. Good sources of vitamin D in your diet include:  Egg yolks.  Saltwater fish.  Milk and cereal fortified with vitamin D.  Follow these recommended amounts for daily vitamin D intake:  Children and teens, age 1?18: 600 international units.  Adults, age 50 or younger: 400-800 international units.  Adults, age 51 or older: 800-1,000 international units.  Other Nutrients Other nutrients  for bone health include:  Phosphorus. This mineral is found in meat, poultry, dairy foods, nuts, and legumes. The recommended daily intake for adult men and adult women is 700 mg.  Magnesium. This mineral is found in seeds, nuts, dark green vegetables, and legumes. The recommended daily intake for adult men is 400?420 mg. For adult women, it is 310?320 mg.  Vitamin K. This vitamin is found in green leafy vegetables. The recommended daily intake is 120 mg for adult men and 90 mg for adult women.  What type of physical activity is best for building and maintaining healthy bones? Weight-bearing and strength-building activities are important for building and maintaining peak bone mass. Weight-bearing activities cause muscles and bones to work against gravity. Strength-building activities increases muscle strength that supports bones. Weight-bearing and muscle-building activities include:  Walking and hiking.  Jogging and running.  Dancing.  Gym exercises.  Lifting weights.  Tennis and racquetball.  Climbing stairs.  Aerobics.  Adults should get at least 30 minutes of moderate physical activity on most days. Children should get at least 60 minutes of moderate physical activity on most days. Ask your health care provide what type of exercise is best for you. Where can I find more information? For more information, check out the following websites:  National Osteoporosis Foundation: http://nof.org/learn/basics  National Institutes of Health: http://www.niams.nih.gov/Health_Info/Bone/Bone_Health/bone_health_for_life.asp  This information is not intended to replace advice given to you by your health care provider. Make sure you discuss any questions you have with your health care provider. Document Released: 09/29/2003 Document Revised: 01/27/2016 Document Reviewed: 07/14/2014 Elsevier Interactive Patient Education  2018 [employer]  

## 2018-04-01 NOTE — Progress Notes (Addendum)
CLINIC:  Survivorship   REASON FOR VISIT:  Routine follow-up for history of breast cancer.   BRIEF ONCOLOGIC HISTORY:    Breast cancer of lower-outer quadrant of left female breast (Krystal [name])   12/28/2010 Surgery    Bilateral mastectomies: Left: no residual cancer, 1/15 LN positive; Right: benign, 0/3 LN    01/29/2011 - 07/02/2011 Chemotherapy    adjuvant chemotherapy following NSABP B. 49 clinical trial. She received 4 cycles of dose dense Adriamycin and Cytoxan followed by weekly single agent Taxol     07/31/2011 -  Anti-estrogen oral therapy    tamoxifen 10 mg daily daily. A total of 10 years therapy is planned based on BCI testing inability to be performed to to insufficient tissue. She has also been on Aromasin and Arimidex in the past, and was unable to tolerate these       INTERVAL HISTORY:  Krystal [name] presents to the [facility] today for routine follow-up for her history of breast cancer.  She continues on tamoxifen daily.  She continues to experience hot flashes due to this.  She plans on taking this for the full 10 years.    Krystal is requesting that she have her labs done today that she typically has for her annual exams.  She has right knee pain that is bone on bone and needs a knee replacement but is putting it off.  She notes intermittent flares in her plantar fasciitis.  She notes that she uses lymphedema pumps to help with her swelling.      REVIEW OF SYSTEMS:  Review of Systems  Constitutional: Negative for appetite change, chills, fatigue, fever and unexpected weight change.  HENT:   Negative for hearing loss, lump/mass and trouble swallowing.   Eyes: Negative for eye problems and icterus.  Respiratory: Positive for cough (notes intermittent and aggravating x 3-4 weeks) and wheezing (notes intermittent and new). Negative for chest tightness and shortness of breath.   Cardiovascular: Negative for chest pain, leg swelling and palpitations.  Gastrointestinal:  Negative for abdominal distention, abdominal pain, constipation, diarrhea, nausea and vomiting.  Endocrine: Negative for hot flashes.  Skin: Negative for itching and rash.  Neurological: Negative for dizziness, extremity weakness, headaches and numbness.  Hematological: Negative for adenopathy. Does not bruise/bleed easily.  Psychiatric/Behavioral: Negative for depression. The patient is not nervous/anxious.   Breast: Denies any new nodularity, masses, tenderness, nipple changes, or nipple discharge.       PAST MEDICAL/SURGICAL HISTORY:  Past Medical History:  Diagnosis Date  . Anemia   . Arthritis    osteo  . Blindness of right eye at birth  . Cancer ([place])    Left breast  . Cataract   . Depression   . Edema 07/27/2011  . Fibromyalgia    muscle weakness and pain  . Hyperlipidemia   . Hypertension    benign  . Osteoporosis   . Plantar fasciitis   . Restless leg syndrome   . Rosacea   . Rotator cuff rupture   . Synovitis   . Ulcer    Past Surgical History:  Procedure Laterality Date  . ABDOMINAL HYSTERECTOMY  1986   For bleeding and pain  . BLADDER SURGERY  2006   Bladder tack  . BREAST RECONSTRUCTION Bilateral 09/17/2012   Procedure: BREAST RECONSTRUCTION;  Surgeon: Theodoro Kos, DO;  Location: [place];  Service: Plastics;  Laterality: Bilateral;  BILATERAL BREAST RECONSTRUCTION WITH TISSUE EXPANDERS AND ALLOMED  . BREAST RECONSTRUCTION Right 06/03/2013  Procedure: RIGHT BREAST CAPSULE CONSTRACTURE;  Surgeon: Theodoro Kos, DO;  Location: [place];  Service: Plastics;  Laterality: Right;  . CARPAL TUNNEL RELEASE    . EYE SURGERY    . LIPOSUCTION Bilateral 01/29/2013   Procedure: LIPOSUCTION;  Surgeon: Theodoro Kos, DO;  Location: [place];  Service: Plastics;  Laterality: Bilateral;  . LIPOSUCTION Right 06/03/2013   Procedure: LIPOSUCTION;  Surgeon: Theodoro Kos, DO;  Location: [place];  Service:  Plastics;  Laterality: Right;  . MASTECTOMY  2012   rt prophalactic mast-snbx  . MASTECTOMY MODIFIED RADICAL  2012   left-axillary nodes  . NEUROMA SURGERY    . NOSE SURGERY  2007  . PORT-A-CATH REMOVAL     insertion and  . THROAT SURGERY    . UVULOPALATOPHARYNGOPLASTY       ALLERGIES:  Allergies  Allergen Reactions  . Cephalexin Nausea Only    Other reaction(s): Diaphoresis / Sweating (intolerance)  . Levaquin [Levofloxacin] Other (See Comments)    Reaction:  Stiff joints, unable to walk.     CURRENT MEDICATIONS:  Outpatient Encounter Medications as of 04/01/2018  Medication Sig Note  . alendronate (FOSAMAX) 70 MG tablet Take 70 mg by mouth every 7 (seven) days. Take with a full glass of water on an empty stomach.    Marland Kitchen amLODipine (NORVASC) 10 MG tablet Take 10 mg by mouth daily.    Marland Kitchen atenolol (TENORMIN) 100 MG tablet Take 100 mg by mouth daily.     . carisoprodol (SOMA) 350 MG tablet Take 350 mg by mouth 2 (two) times daily.   . fluticasone (FLONASE) 50 MCG/ACT nasal spray Place into both nostrils daily.   . furosemide (LASIX) 20 MG tablet Take 20 mg by mouth 2 (two) times daily.    Marland Kitchen lisinopril-hydrochlorothiazide (PRINZIDE,ZESTORETIC) 20-25 MG per tablet Take 1 tablet by mouth every other day.    Marland Kitchen LYSINE ACETATE PO Take by mouth. 04/04/2015: Received from: [place]  . magnesium oxide (MAG-OX) 400 MG tablet Take 400 mg by mouth 2 (two) times daily.   . meloxicam (MOBIC) 7.5 MG tablet TAKE 1 TABLET (7.5 MG TOTAL) BY MOUTH ONCE DAILY. 04/04/2015: Received from: [place]  . potassium chloride SA (K-DUR,KLOR-CON) 20 MEQ tablet Take 20 mEq by mouth 2 (two) times daily.   . pregabalin (LYRICA) 300 MG capsule Take 300 mg by mouth 2 (two) times daily.     . ranitidine (ZANTAC) 150 MG tablet Take 150 mg by mouth 2 (two) times daily.     Marland Kitchen rOPINIRole (REQUIP) 0.5 MG tablet Take 0.5 mg by mouth every evening.   . tamoxifen (NOLVADEX) 10 MG  tablet TAKE 1 TABLET (10 MG TOTAL) BY MOUTH DAILY.   Marland Kitchen zolpidem (AMBIEN) 10 MG tablet Take 10 mg by mouth at bedtime.    No facility-administered encounter medications on file as of 04/01/2018.      ONCOLOGIC FAMILY HISTORY:  Family History  Problem Relation Age of Onset  . Cancer Mother 47       non hodgkins lymphoma  . Cancer Father        neck, throat, lung  . Lung cancer Father   . Throat cancer Father   . Hypertension Sister   . Cancer Sister 53       Breast cancer dx'd 04/2011  . Hypertension Brother   . Breast cancer Maternal Aunt   . Liver cancer Maternal Uncle   . Cancer Maternal  Grandmother        died 4, unknown cancer  . Cancer Maternal Grandfather        died in his 76s; unknown cancer  . Lung cancer Maternal Uncle   . Throat cancer Maternal Uncle   . Breast cancer Maternal Aunt   . Cancer Cousin        died under the age 32, unknown cancer  . Cancer Cousin        died age 64; unknown cancer     PHYSICAL EXAMINATION:  Vital Signs: Vitals:   04/01/18 1122  BP: (!) 106/54  Pulse: 76  Resp: 18  Temp: 98.6 F (37 C)  SpO2: 98%   Filed Weights   04/01/18 1122  Weight: 185 lb 11.2 oz (84.2 kg)   General: Well-nourished, well-appearing female in no acute distress.  Unaccompanied today.   HEENT: Head is normocephalic.  Pupils equal and reactive to light. Conjunctivae clear without exudate.  Sclerae anicteric. Oral mucosa is pink, moist.  Oropharynx is pink without lesions or erythema.  Lymph: No cervical, supraclavicular, or infraclavicular lymphadenopathy noted on palpation.  Cardiovascular: Regular rate and rhythm.Marland Kitchen Respiratory: Clear to auscultation bilaterally. Chest expansion symmetric; breathing non-labored.  Breast Exam:  S/p bilateral mastectomies with implant placement, no swelling, nodules, masses or any sign of recurrence -Axilla: No axillary adenopathy bilaterally.  GI: Abdomen soft and round; non-tender, non-distended. Bowel sounds  normoactive. No hepatosplenomegaly.   GU: Deferred.  Neuro: No focal deficits. Steady gait.  Psych: Mood and affect normal and appropriate for situation.  MSK: No focal spinal tenderness to palpation, full range of motion in bilateral upper extremities Extremities: No edema. Skin: Warm and dry.  LABORATORY DATA:  Appointment on 04/01/2018  Component Date Value Ref Range Status  . WBC Count 04/01/2018 6.4  3.9 - 10.3 K/uL Final  . RBC 04/01/2018 4.27  3.70 - 5.45 MIL/uL Final  . Hemoglobin 04/01/2018 14.1  11.6 - 15.9 g/dL Final  . HCT 04/01/2018 41.2  34.8 - 46.6 % Final  . MCV 04/01/2018 96.5  79.5 - 101.0 fL Final  . MCH 04/01/2018 33.0  25.1 - 34.0 pg Final  . MCHC 04/01/2018 34.2  31.5 - 36.0 g/dL Final  . RDW 04/01/2018 14.3  11.2 - 14.5 % Final  . Platelet Count 04/01/2018 253  145 - 400 K/uL Final  . Neutrophils Relative % 04/01/2018 59  % Final  . Neutro Abs 04/01/2018 3.8  1.5 - 6.5 K/uL Final  . Lymphocytes Relative 04/01/2018 27  % Final  . Lymphs Abs 04/01/2018 1.7  0.9 - 3.3 K/uL Final  . Monocytes Relative 04/01/2018 9  % Final  . Monocytes Absolute 04/01/2018 0.6  0.1 - 0.9 K/uL Final  . Eosinophils Relative 04/01/2018 4  % Final  . Eosinophils Absolute 04/01/2018 0.3  0.0 - 0.5 K/uL Final  . Basophils Relative 04/01/2018 1  % Final  . Basophils Absolute 04/01/2018 0.1  0.0 - 0.1 K/uL Final   Performed at [facility], [place] [address] [place], [place] [address]   Lab testing:   Appointment on 04/01/2018  Component Date Value Ref Range Status  . Cholesterol 04/01/2018 199  0 - 200 mg/dL Final  . Triglycerides 04/01/2018 136  <150 mg/dL Final  . HDL 04/01/2018 56  >40 mg/dL Final  . Total CHOL/HDL Ratio 04/01/2018 3.6  RATIO Final  . VLDL 04/01/2018 27  0 - 40 mg/dL Final  . LDL Cholesterol 04/01/2018 116* 0 -  99 mg/dL Final   Comment:        Total Cholesterol/HDL:CHD Risk Coronary Heart Disease Risk Table                     Men    Women  1/2 Average Risk   3.4   3.3  Average Risk       5.0   4.4  2 X Average Risk   9.6   7.1  3 X Average Risk  23.4   11.0        Use the calculated Patient Ratio above and the CHD Risk Table to determine the patient's CHD Risk.        ATP III CLASSIFICATION (LDL):  <100     mg/dL   Optimal  100-129  mg/dL   Near or Above                    Optimal  130-159  mg/dL   Borderline  160-189  mg/dL   High  >190     mg/dL   Very High Performed at [facility] [address], [place], [place] [address]   Appointment on 04/01/2018  Component Date Value Ref Range Status  . WBC Count 04/01/2018 6.4  3.9 - 10.3 K/uL Final  . RBC 04/01/2018 4.27  3.70 - 5.45 MIL/uL Final  . Hemoglobin 04/01/2018 14.1  11.6 - 15.9 g/dL Final  . HCT 04/01/2018 41.2  34.8 - 46.6 % Final  . MCV 04/01/2018 96.5  79.5 - 101.0 fL Final  . MCH 04/01/2018 33.0  25.1 - 34.0 pg Final  . MCHC 04/01/2018 34.2  31.5 - 36.0 g/dL Final  . RDW 04/01/2018 14.3  11.2 - 14.5 % Final  . Platelet Count 04/01/2018 253  145 - 400 K/uL Final  . Neutrophils Relative % 04/01/2018 59  % Final  . Neutro Abs 04/01/2018 3.8  1.5 - 6.5 K/uL Final  . Lymphocytes Relative 04/01/2018 27  % Final  . Lymphs Abs 04/01/2018 1.7  0.9 - 3.3 K/uL Final  . Monocytes Relative 04/01/2018 9  % Final  . Monocytes Absolute 04/01/2018 0.6  0.1 - 0.9 K/uL Final  . Eosinophils Relative 04/01/2018 4  % Final  . Eosinophils Absolute 04/01/2018 0.3  0.0 - 0.5 K/uL Final  . Basophils Relative 04/01/2018 1  % Final  . Basophils Absolute 04/01/2018 0.1  0.0 - 0.1 K/uL Final   Performed at [facility], [place] [address], [place], [place] [address]  . Sodium 04/01/2018 139  135 - 145 mmol/L Final  . Potassium 04/01/2018 3.8  3.5 - 5.1 mmol/L Final  . Chloride 04/01/2018 100  98 - 111 mmol/L Final  . CO2 04/01/2018 27  22 - 32 mmol/L Final  . Glucose, Bld 04/01/2018 96  70 - 99 mg/dL Final  . BUN 04/01/2018 10  8  - 23 mg/dL Final  . Creatinine 04/01/2018 1.05* 0.44 - 1.00 mg/dL Final  . Calcium 04/01/2018 9.2  8.9 - 10.3 mg/dL Final  . Total Protein 04/01/2018 6.8  6.5 - 8.1 g/dL Final  . Albumin 04/01/2018 3.7  3.5 - 5.0 g/dL Final  . AST 04/01/2018 38  15 - 41 U/L Final  . ALT 04/01/2018 33  0 - 44 U/L Final  . Alkaline Phosphatase 04/01/2018 62  38 - 126 U/L Final  . Total Bilirubin 04/01/2018 0.5  0.3 - 1.2 mg/dL Final  . GFR, Est Non  Af Am 04/01/2018 55* >60 mL/min Final  . GFR, Est AFR Am 04/01/2018 >60  >60 mL/min Final   Comment: (NOTE) The eGFR has been calculated using the CKD EPI equation. This calculation has not been validated in all clinical situations. eGFR's persistently <60 mL/min signify possible Chronic Kidney Disease.   Georgiann Hahn gap 04/01/2018 12  5 - 15 Final   Performed at [facility], [place] [address] [place], [place] [address]    DIAGNOSTIC IMAGING:  Most recent mammogram:  N/a s/p bilateral mastectomy    ASSESSMENT AND PLAN:  Ms.. [name] is a pleasant 67 y.o. female with history of Stage IIA left breast invasive ductal carcinoma, ER+/PR+/HER2-, diagnosed in 12/2010, treated with bilateral mastectomy and axillary node dissection, adjuvant chemotherapy, and anti-estrogen therapy with Tamoxifen beginning in 07/2011 (reduced dose of 77m due to side effects).  [facility] for surveillance and routine follow-up.   1. History of breast cancer:  Ms. MJacquesis currently clinically without evidence of disease or recurrence of breast cancer. She is doing well.  We reviewed with the pulling sensation in her breast, and the length of time that she has had her implants, that it may be beneficial to get an MRI of her breasts and implants.  She is likely going to need a knee replacement soon, so she is going to let me know when that will be and we will plan the MRI around then.  She will return in one year for labs and follow up.  I  encouraged her to call me with any questions or concerns before her next visit at the cancer center, and I would be happy to see her sooner, if needed.    2. Bone health:  Given Krystal [name]'s age, history of breast cancer, she is at risk for bone demineralization. I counseled her that Tamoxifen has a protective effect on her bones. She was given education on specific food and activities to promote bone health.  I will defer to her PCP for future bone density testing and management.   3. Cancer screening:  Due to Krystal [name] history and her age, she should receive screening for skin cancers and colon cancer.  She was encouraged to follow-up with her PCP for appropriate cancer screenings.   4. Health maintenance and wellness promotion: Ms. MRidgelywas encouraged to consume 5-7 servings of fruits and vegetables per day. She was also encouraged to engage in moderate to vigorous exercise for 30 minutes per day most days of the week. She was instructed to limit her alcohol consumption and continue to abstain from tobacco use.  At her request, I had some routine labs tested for her PCP.  I will have these results faxed to her PCP once they are available.     5. Cough/wheezing: this is likely due to allergies, however will get a chest xray to fully evaluate due to duration of symptoms.    Dispo:  -Return to cancer center in one year for LTS follow up   A total of (30) minutes of face-to-face time was spent with this patient with greater than 50% of that time in counseling and care-coordination.   LGardenia Phlegm [phone]  Note: PRIMARY CARE PROVIDER HMaryland Pink MRancho Pal[phone]

## 2018-04-02 DIAGNOSIS — M79642 Pain in left hand: Secondary | ICD-10-CM | POA: Diagnosis not present

## 2018-04-02 DIAGNOSIS — M150 Primary generalized (osteo)arthritis: Secondary | ICD-10-CM | POA: Diagnosis not present

## 2018-04-02 DIAGNOSIS — M25561 Pain in right knee: Secondary | ICD-10-CM | POA: Diagnosis not present

## 2018-04-02 DIAGNOSIS — M79641 Pain in right hand: Secondary | ICD-10-CM | POA: Diagnosis not present

## 2018-04-02 DIAGNOSIS — G8929 Other chronic pain: Secondary | ICD-10-CM | POA: Diagnosis not present

## 2018-04-02 LAB — MISC LABCORP TEST (SEND OUT): LABCORP TEST CODE: 1453

## 2018-04-03 ENCOUNTER — Telehealth: Payer: Self-pay

## 2018-04-03 NOTE — Telephone Encounter (Signed)
-----   Message from Gardenia Phlegm, NP sent at 04/02/2018  6:34 AM EDT ----- Chest x ray is normal ----- Message ----- From: Interface, Rad Results In Sent: 04/02/2018   2:32 AM EDT To: Gardenia Phlegm, NP

## 2018-04-03 NOTE — Telephone Encounter (Signed)
Spoke with patient to inform of normal chest xray results.  Patient voiced understanding and knows to call center if any issues arise.

## 2018-05-02 DIAGNOSIS — I10 Essential (primary) hypertension: Secondary | ICD-10-CM | POA: Diagnosis not present

## 2018-05-02 DIAGNOSIS — E559 Vitamin D deficiency, unspecified: Secondary | ICD-10-CM | POA: Diagnosis not present

## 2018-05-06 DIAGNOSIS — Z0100 Encounter for examination of eyes and vision without abnormal findings: Secondary | ICD-10-CM | POA: Diagnosis not present

## 2018-05-08 DIAGNOSIS — I10 Essential (primary) hypertension: Secondary | ICD-10-CM | POA: Diagnosis not present

## 2018-05-08 DIAGNOSIS — R739 Hyperglycemia, unspecified: Secondary | ICD-10-CM | POA: Diagnosis not present

## 2018-07-06 ENCOUNTER — Other Ambulatory Visit: Payer: Self-pay | Admitting: Oncology

## 2018-07-10 DIAGNOSIS — Z01818 Encounter for other preprocedural examination: Secondary | ICD-10-CM | POA: Diagnosis not present

## 2018-07-10 DIAGNOSIS — I208 Other forms of angina pectoris: Secondary | ICD-10-CM | POA: Diagnosis not present

## 2018-07-10 DIAGNOSIS — F329 Major depressive disorder, single episode, unspecified: Secondary | ICD-10-CM | POA: Diagnosis not present

## 2018-07-10 DIAGNOSIS — I10 Essential (primary) hypertension: Secondary | ICD-10-CM | POA: Diagnosis not present

## 2018-07-10 DIAGNOSIS — I890 Lymphedema, not elsewhere classified: Secondary | ICD-10-CM | POA: Diagnosis not present

## 2018-07-10 DIAGNOSIS — I7300 Raynaud's syndrome without gangrene: Secondary | ICD-10-CM | POA: Diagnosis not present

## 2018-07-10 DIAGNOSIS — M1990 Unspecified osteoarthritis, unspecified site: Secondary | ICD-10-CM | POA: Diagnosis not present

## 2018-07-10 DIAGNOSIS — E785 Hyperlipidemia, unspecified: Secondary | ICD-10-CM | POA: Diagnosis not present

## 2018-07-25 DIAGNOSIS — M25561 Pain in right knee: Secondary | ICD-10-CM | POA: Diagnosis not present

## 2018-07-25 DIAGNOSIS — M25562 Pain in left knee: Secondary | ICD-10-CM | POA: Diagnosis not present

## 2018-07-25 DIAGNOSIS — M170 Bilateral primary osteoarthritis of knee: Secondary | ICD-10-CM | POA: Diagnosis not present

## 2018-08-01 DIAGNOSIS — I208 Other forms of angina pectoris: Secondary | ICD-10-CM | POA: Diagnosis not present

## 2018-08-06 ENCOUNTER — Encounter
Admission: RE | Admit: 2018-08-06 | Discharge: 2018-08-06 | Disposition: A | Discharge status: Home / Self Care | Payer: Medicare HMO | Source: Ambulatory Visit | Attending: Surgery | Admitting: Surgery

## 2018-08-06 ENCOUNTER — Ambulatory Visit
Admission: RE | Admit: 2018-08-06 | Discharge: 2018-08-06 | Disposition: A | Discharge status: Home / Self Care | Payer: Medicare HMO | Source: Ambulatory Visit | Attending: Surgery | Admitting: Surgery

## 2018-08-06 ENCOUNTER — Other Ambulatory Visit: Payer: Self-pay

## 2018-08-06 DIAGNOSIS — Z01818 Encounter for other preprocedural examination: Secondary | ICD-10-CM

## 2018-08-06 DIAGNOSIS — I10 Essential (primary) hypertension: Secondary | ICD-10-CM | POA: Diagnosis not present

## 2018-08-06 HISTORY — DX: Sleep apnea, unspecified: G47.30

## 2018-08-06 HISTORY — DX: Gastro-esophageal reflux disease without esophagitis: K21.9

## 2018-08-06 HISTORY — DX: Prediabetes: R73.03

## 2018-08-06 LAB — URINALYSIS, ROUTINE W REFLEX MICROSCOPIC
Bacteria, UA: NONE SEEN
Bilirubin Urine: NEGATIVE
GLUCOSE, UA: NEGATIVE mg/dL
Ketones, ur: NEGATIVE mg/dL
NITRITE: NEGATIVE
Protein, ur: NEGATIVE mg/dL
Specific Gravity, Urine: 1.005 (ref 1.005–1.030)
pH: 7 (ref 5.0–8.0)

## 2018-08-06 LAB — CBC
HCT: 43 % (ref 36.0–46.0)
Hemoglobin: 14.8 g/dL (ref 12.0–15.0)
MCH: 32.6 pg (ref 26.0–34.0)
MCHC: 34.4 g/dL (ref 30.0–36.0)
MCV: 94.7 fL (ref 80.0–100.0)
Platelets: 256 10*3/uL (ref 150–400)
RBC: 4.54 MIL/uL (ref 3.87–5.11)
RDW: 13.2 % (ref 11.5–15.5)
WBC: 5.8 10*3/uL (ref 4.0–10.5)
nRBC: 0 % (ref 0.0–0.2)

## 2018-08-06 LAB — SURGICAL PCR SCREEN
MRSA, PCR: NEGATIVE
Staphylococcus aureus: NEGATIVE

## 2018-08-06 LAB — PROTIME-INR
INR: 0.97
PROTHROMBIN TIME: 12.8 s (ref 11.4–15.2)

## 2018-08-06 LAB — TYPE AND SCREEN
ABO/RH(D): A POS
Antibody Screen: NEGATIVE

## 2018-08-06 LAB — APTT: aPTT: <24 seconds — ABNORMAL LOW (ref 24–36)

## 2018-08-06 NOTE — Patient Instructions (Addendum)
  Your procedure is scheduled on: Tuesday August 12, 2018 Report to Same Day Surgery 2nd floor [facility] Entrance-take elevator on left to 2nd floor.  Check in with surgery information desk.) To find out your arrival time, call [phone] 1:00-3:00 PM on Monday August 11, 2018   Remember: Instructions that are not followed completely may result in serious medical risk, up to and including death, or upon the discretion of your surgeon and anesthesiologist your surgery may need to be rescheduled.    __x__ 1. Do not eat food (including mints, candies, chewing gum) after midnight the night before your procedure. You may drink clear liquids up to 2 hours before you are scheduled to arrive at the hospital for your procedure.  Do not drink anything within 2 hours of your scheduled arrival to the hospital.  Approved clear liquids:  --Water or Apple juice without pulp  --Clear carbohydrate beverage such as Gatorade or Powerade  --Black Coffee or Clear Tea (No milk, no creamers, do not add anything to the coffee or tea)    __x__ 2. No Alcohol for 24 hours before or after surgery.   __x__ 3. No Smoking or e-cigarettes for 24 hours before surgery.  Do not use any chewable tobacco products for at least 6 hours before surgery.   __x__ 4. Notify your doctor if there is any change in your medical condition (cold, fever, infections).   __x__ 5. On the morning of surgery brush your teeth with toothpaste and water.  You may rinse your mouth with mouthwash if you wish.  Do not swallow any toothpaste or mouthwash.  Please read over the following fact sheets that you were given:   [facility] Preparing for Surgery and/or MRSA Information    __x__ Use CHG Soap or Sage wipes as directed on instruction sheet.   Do not wear jewelry, make-up, hairpins, clips or nail polish on the day of surgery.  Do not wear lotions, powders, deodorant, or perfumes.   Do not shave below the face/neck 48  hours prior to surgery.   Do not bring valuables to the hospital.    [facility] is not responsible for any belongings or valuables.               Contacts, dentures or bridgework may not be worn into surgery.  Leave your suitcase in the car. After surgery it may be brought to your room.  For patients admitted to the hospital, discharge time is determined by your treatment team.  __x__ Take these medications on the morning of surgery with a SMALL SIP OF WATER:  1. Amlodipine (Norvasc)  2. Atenolol (Tenormin)  3. Ranitidine (Zantac)  4. Pregabalin (Lyrica)  __x__ Do not take your Furosemide (Lasix) or Lisinopril-HCTZ (Prinizide) on the morning of surgery.  You do not need to stop taking these medicines ahead of time.  __x__ Follow recommendations from Cardiologist, Pulmonologist or PCP regarding stopping Aspirin, Coumadin, Plavix, Eliquis, Effient, Pradaxa, and Pletal.  __x__ TODAY: Stop Anti-inflammatories such as Meloxicam (Mobic), Aspirin,  Advil, Ibuprofen, Motrin, Aleve, Naproxen, Naprosyn, BC/Goodies powders or aspirin products. You may continue to take Tylenol and Celebrex.   __x__ TODAY: Stop supplements (Glucosamine-Chondroitin) until after surgery. You may continue to take Magnesium, Potassium, Vitamin D, Vitamin B, and multivitamin.

## 2018-08-06 NOTE — Pre-Procedure Instructions (Signed)
Dr. Nicholaus Bloom office faxed with PT/PTT, UA lab results from PAT visit, BMP reordered for DOS draw because sample drawn in PAT hemolyzed, and request for clarification re: cefazolin ordered for DOS ppx, if needs different abx ordered d/t allergy to cephalexin.

## 2018-08-07 LAB — URINE CULTURE: Culture: 50000 — AB

## 2018-08-11 MED ORDER — VANCOMYCIN HCL IN DEXTROSE 1-5 GM/200ML-% IV SOLN
1000.0000 mg | Freq: Once | INTRAVENOUS | Status: AC
  Administered 2018-08-12: 1000 mg via INTRAVENOUS

## 2018-08-12 ENCOUNTER — Inpatient Hospital Stay
Admission: RE | Admit: 2018-08-12 | Discharge: 2018-08-14 | DRG: 470 | Disposition: A | Discharge status: Home / Self Care | Payer: Medicare HMO | Attending: Surgery | Admitting: Surgery

## 2018-08-12 ENCOUNTER — Other Ambulatory Visit: Payer: Self-pay

## 2018-08-12 ENCOUNTER — Encounter: Payer: Self-pay | Admitting: *Deleted

## 2018-08-12 ENCOUNTER — Inpatient Hospital Stay: Payer: Medicare HMO

## 2018-08-12 ENCOUNTER — Inpatient Hospital Stay: Payer: Medicare HMO | Admitting: Certified Registered Nurse Anesthetist

## 2018-08-12 ENCOUNTER — Encounter
Admission: RE | Disposition: A | Discharge status: Home / Self Care | Payer: Self-pay | Source: Home / Self Care | Attending: Surgery

## 2018-08-12 DIAGNOSIS — K219 Gastro-esophageal reflux disease without esophagitis: Secondary | ICD-10-CM | POA: Diagnosis not present

## 2018-08-12 DIAGNOSIS — G4730 Sleep apnea, unspecified: Secondary | ICD-10-CM | POA: Diagnosis not present

## 2018-08-12 DIAGNOSIS — Z79899 Other long term (current) drug therapy: Secondary | ICD-10-CM | POA: Diagnosis not present

## 2018-08-12 DIAGNOSIS — Z96651 Presence of right artificial knee joint: Secondary | ICD-10-CM

## 2018-08-12 DIAGNOSIS — M21162 Varus deformity, not elsewhere classified, left knee: Secondary | ICD-10-CM | POA: Diagnosis not present

## 2018-08-12 DIAGNOSIS — I7300 Raynaud's syndrome without gangrene: Secondary | ICD-10-CM | POA: Diagnosis present

## 2018-08-12 DIAGNOSIS — Z9013 Acquired absence of bilateral breasts and nipples: Secondary | ICD-10-CM

## 2018-08-12 DIAGNOSIS — Z800 Family history of malignant neoplasm of digestive organs: Secondary | ICD-10-CM

## 2018-08-12 DIAGNOSIS — Z23 Encounter for immunization: Secondary | ICD-10-CM

## 2018-08-12 DIAGNOSIS — L719 Rosacea, unspecified: Secondary | ICD-10-CM | POA: Diagnosis present

## 2018-08-12 DIAGNOSIS — Z90710 Acquired absence of both cervix and uterus: Secondary | ICD-10-CM | POA: Diagnosis not present

## 2018-08-12 DIAGNOSIS — G2581 Restless legs syndrome: Secondary | ICD-10-CM | POA: Diagnosis present

## 2018-08-12 DIAGNOSIS — I10 Essential (primary) hypertension: Secondary | ICD-10-CM | POA: Diagnosis not present

## 2018-08-12 DIAGNOSIS — E785 Hyperlipidemia, unspecified: Secondary | ICD-10-CM | POA: Diagnosis not present

## 2018-08-12 DIAGNOSIS — Z801 Family history of malignant neoplasm of trachea, bronchus and lung: Secondary | ICD-10-CM

## 2018-08-12 DIAGNOSIS — Z853 Personal history of malignant neoplasm of breast: Secondary | ICD-10-CM | POA: Diagnosis not present

## 2018-08-12 DIAGNOSIS — Z791 Long term (current) use of non-steroidal anti-inflammatories (NSAID): Secondary | ICD-10-CM

## 2018-08-12 DIAGNOSIS — H5461 Unqualified visual loss, right eye, normal vision left eye: Secondary | ICD-10-CM | POA: Diagnosis present

## 2018-08-12 DIAGNOSIS — M797 Fibromyalgia: Secondary | ICD-10-CM | POA: Diagnosis not present

## 2018-08-12 DIAGNOSIS — Z8249 Family history of ischemic heart disease and other diseases of the circulatory system: Secondary | ICD-10-CM

## 2018-08-12 DIAGNOSIS — M21371 Foot drop, right foot: Secondary | ICD-10-CM | POA: Diagnosis not present

## 2018-08-12 DIAGNOSIS — M1711 Unilateral primary osteoarthritis, right knee: Secondary | ICD-10-CM | POA: Diagnosis not present

## 2018-08-12 DIAGNOSIS — Z471 Aftercare following joint replacement surgery: Secondary | ICD-10-CM | POA: Diagnosis not present

## 2018-08-12 DIAGNOSIS — R7303 Prediabetes: Secondary | ICD-10-CM | POA: Diagnosis present

## 2018-08-12 DIAGNOSIS — M75101 Unspecified rotator cuff tear or rupture of right shoulder, not specified as traumatic: Secondary | ICD-10-CM | POA: Diagnosis present

## 2018-08-12 DIAGNOSIS — Z807 Family history of other malignant neoplasms of lymphoid, hematopoietic and related tissues: Secondary | ICD-10-CM

## 2018-08-12 DIAGNOSIS — M75100 Unspecified rotator cuff tear or rupture of unspecified shoulder, not specified as traumatic: Secondary | ICD-10-CM | POA: Diagnosis present

## 2018-08-12 DIAGNOSIS — Z881 Allergy status to other antibiotic agents status: Secondary | ICD-10-CM

## 2018-08-12 DIAGNOSIS — M170 Bilateral primary osteoarthritis of knee: Principal | ICD-10-CM | POA: Diagnosis present

## 2018-08-12 DIAGNOSIS — M810 Age-related osteoporosis without current pathological fracture: Secondary | ICD-10-CM | POA: Diagnosis present

## 2018-08-12 DIAGNOSIS — M21161 Varus deformity, not elsewhere classified, right knee: Secondary | ICD-10-CM | POA: Diagnosis not present

## 2018-08-12 HISTORY — PX: TOTAL KNEE ARTHROPLASTY: SHX125

## 2018-08-12 HISTORY — PX: INJECTION KNEE: SHX2446

## 2018-08-12 LAB — BASIC METABOLIC PANEL
Anion gap: 11 (ref 5–15)
BUN: 17 mg/dL (ref 8–23)
CO2: 27 mmol/L (ref 22–32)
Calcium: 8.7 mg/dL — ABNORMAL LOW (ref 8.9–10.3)
Chloride: 92 mmol/L — ABNORMAL LOW (ref 98–111)
Creatinine, Ser: 1.12 mg/dL — ABNORMAL HIGH (ref 0.44–1.00)
GFR calc Af Amer: 60 mL/min — ABNORMAL LOW (ref >60)
GFR calc non Af Amer: 52 mL/min — ABNORMAL LOW (ref >60)
Glucose, Bld: 115 mg/dL — ABNORMAL HIGH (ref 70–99)
Potassium: 3.1 mmol/L — ABNORMAL LOW (ref 3.5–5.1)
SODIUM: 130 mmol/L — AB (ref 135–145)

## 2018-08-12 LAB — ABO/RH: ABO/RH(D): A POS

## 2018-08-12 SURGERY — ARTHROPLASTY, KNEE, TOTAL
Anesthesia: General | Site: Knee | Laterality: Right

## 2018-08-12 MED ORDER — BUPIVACAINE-EPINEPHRINE (PF) 0.5% -1:200000 IJ SOLN
INTRAMUSCULAR | Status: AC
  Filled 2018-08-12: qty 90

## 2018-08-12 MED ORDER — TRIAMCINOLONE ACETONIDE 40 MG/ML IJ SUSP
INTRAMUSCULAR | Status: AC
  Filled 2018-08-12: qty 1

## 2018-08-12 MED ORDER — PROPOFOL 10 MG/ML IV BOLUS
INTRAVENOUS | Status: DC | PRN
  Administered 2018-08-12 (×6): 17 mg via INTRAVENOUS

## 2018-08-12 MED ORDER — MIDAZOLAM HCL 5 MG/5ML IJ SOLN
INTRAMUSCULAR | Status: DC | PRN
  Administered 2018-08-12: 2 mg via INTRAVENOUS

## 2018-08-12 MED ORDER — FLEET ENEMA 7-19 GM/118ML RE ENEM: 1.0000 | ENEMA | Freq: Once | RECTAL | Status: DC | PRN

## 2018-08-12 MED ORDER — LISINOPRIL-HYDROCHLOROTHIAZIDE 20-25 MG PO TABS: 1.0000 | ORAL_TABLET | ORAL | Status: DC

## 2018-08-12 MED ORDER — ONDANSETRON HCL 4 MG/2ML IJ SOLN: 4.0000 mg | Freq: Once | INTRAMUSCULAR | Status: DC | PRN

## 2018-08-12 MED ORDER — PROPOFOL 500 MG/50ML IV EMUL
INTRAVENOUS | Status: DC | PRN
  Administered 2018-08-12: 80 ug/kg/min via INTRAVENOUS

## 2018-08-12 MED ORDER — DIPHENHYDRAMINE HCL 25 MG PO CAPS: 25.0000 mg | ORAL_CAPSULE | Freq: Every evening | ORAL | Status: DC | PRN

## 2018-08-12 MED ORDER — TRIAMCINOLONE ACETONIDE 0.025 % EX CREA
1.0000 "application " | TOPICAL_CREAM | Freq: Every day | CUTANEOUS | Status: DC | PRN
  Filled 2018-08-12: qty 15

## 2018-08-12 MED ORDER — FENTANYL CITRATE (PF) 100 MCG/2ML IJ SOLN
INTRAMUSCULAR | Status: AC
  Filled 2018-08-12: qty 2

## 2018-08-12 MED ORDER — LIDOCAINE HCL (PF) 2 % IJ SOLN
INTRAMUSCULAR | Status: AC
  Filled 2018-08-12: qty 10

## 2018-08-12 MED ORDER — AMLODIPINE BESYLATE 10 MG PO TABS
10.0000 mg | ORAL_TABLET | Freq: Every day | ORAL | Status: DC
  Administered 2018-08-13 – 2018-08-14 (×2): 10 mg via ORAL
  Filled 2018-08-12 (×2): qty 1

## 2018-08-12 MED ORDER — TRAMADOL HCL 50 MG PO TABS
50.0000 mg | ORAL_TABLET | Freq: Four times a day (QID) | ORAL | Status: DC | PRN
  Administered 2018-08-12 – 2018-08-14 (×3): 50 mg via ORAL
  Filled 2018-08-12 (×3): qty 1

## 2018-08-12 MED ORDER — PNEUMOCOCCAL VAC POLYVALENT 25 MCG/0.5ML IJ INJ
0.5000 mL | INJECTION | INTRAMUSCULAR | Status: AC
  Administered 2018-08-14: 0.5 mL via INTRAMUSCULAR
  Filled 2018-08-12: qty 0.5

## 2018-08-12 MED ORDER — GLYCOPYRROLATE 0.2 MG/ML IJ SOLN
INTRAMUSCULAR | Status: DC | PRN
  Administered 2018-08-12: 0.2 mg via INTRAVENOUS

## 2018-08-12 MED ORDER — MIDAZOLAM HCL 2 MG/2ML IJ SOLN
INTRAMUSCULAR | Status: AC
  Filled 2018-08-12: qty 2

## 2018-08-12 MED ORDER — ACETAMINOPHEN 500 MG PO TABS
1000.0000 mg | ORAL_TABLET | Freq: Four times a day (QID) | ORAL | Status: AC
  Administered 2018-08-12 – 2018-08-13 (×4): 1000 mg via ORAL
  Filled 2018-08-12 (×4): qty 2

## 2018-08-12 MED ORDER — KETOROLAC TROMETHAMINE 15 MG/ML IJ SOLN
INTRAMUSCULAR | Status: AC
  Filled 2018-08-12: qty 1

## 2018-08-12 MED ORDER — SODIUM CHLORIDE 0.9 % IV SOLN
INTRAVENOUS | Status: DC | PRN
  Administered 2018-08-12: 60 mL

## 2018-08-12 MED ORDER — KETOROLAC TROMETHAMINE 30 MG/ML IJ SOLN: 15.0000 mg | Freq: Once | INTRAMUSCULAR | Status: DC

## 2018-08-12 MED ORDER — VANCOMYCIN HCL IN DEXTROSE 1-5 GM/200ML-% IV SOLN
INTRAVENOUS | Status: AC
  Administered 2018-08-12: 1000 mg via INTRAVENOUS
  Filled 2018-08-12: qty 200

## 2018-08-12 MED ORDER — FLUTICASONE PROPIONATE 50 MCG/ACT NA SUSP
2.0000 | Freq: Two times a day (BID) | NASAL | Status: DC | PRN
  Filled 2018-08-12: qty 16

## 2018-08-12 MED ORDER — BACITRACIN 50000 UNITS IM SOLR
INTRAMUSCULAR | Status: AC
  Filled 2018-08-12: qty 3

## 2018-08-12 MED ORDER — GLYCOPYRROLATE 0.2 MG/ML IJ SOLN
INTRAMUSCULAR | Status: AC
  Filled 2018-08-12: qty 1

## 2018-08-12 MED ORDER — PHENYLEPHRINE HCL 10 MG/ML IJ SOLN
INTRAMUSCULAR | Status: AC
  Filled 2018-08-12: qty 2

## 2018-08-12 MED ORDER — SODIUM CHLORIDE 0.9 % IV SOLN
INTRAVENOUS | Status: DC | PRN
  Administered 2018-08-12: 1000 mL

## 2018-08-12 MED ORDER — TRIAMCINOLONE ACETONIDE 40 MG/ML IJ SUSP
INTRAMUSCULAR | Status: DC | PRN
  Administered 2018-08-12: 80 mg via INTRAMUSCULAR

## 2018-08-12 MED ORDER — VANCOMYCIN HCL IN DEXTROSE 1-5 GM/200ML-% IV SOLN
1000.0000 mg | Freq: Two times a day (BID) | INTRAVENOUS | Status: AC
  Administered 2018-08-12: 1000 mg via INTRAVENOUS
  Filled 2018-08-12: qty 200

## 2018-08-12 MED ORDER — VITAMIN D 25 MCG (1000 UNIT) PO TABS
5000.0000 [IU] | ORAL_TABLET | Freq: Every evening | ORAL | Status: DC
  Administered 2018-08-12: 5000 [IU] via ORAL
  Filled 2018-08-12: qty 5

## 2018-08-12 MED ORDER — BUPIVACAINE HCL (PF) 0.5 % IJ SOLN
INTRAMUSCULAR | Status: AC
  Filled 2018-08-12: qty 10

## 2018-08-12 MED ORDER — LISINOPRIL 20 MG PO TABS
20.0000 mg | ORAL_TABLET | ORAL | Status: DC
  Filled 2018-08-12: qty 1

## 2018-08-12 MED ORDER — BISACODYL 10 MG RE SUPP: 10.0000 mg | Freq: Every day | RECTAL | Status: DC | PRN

## 2018-08-12 MED ORDER — SODIUM CHLORIDE FLUSH 0.9 % IV SOLN
INTRAVENOUS | Status: AC
  Filled 2018-08-12: qty 30

## 2018-08-12 MED ORDER — PREGABALIN 75 MG PO CAPS
300.0000 mg | ORAL_CAPSULE | Freq: Two times a day (BID) | ORAL | Status: DC
  Administered 2018-08-13 – 2018-08-14 (×2): 300 mg via ORAL
  Filled 2018-08-12 (×3): qty 4

## 2018-08-12 MED ORDER — ACETAMINOPHEN 325 MG PO TABS: 325.0000 mg | ORAL_TABLET | Freq: Four times a day (QID) | ORAL | Status: DC | PRN

## 2018-08-12 MED ORDER — SODIUM CHLORIDE 0.9 % IV SOLN
INTRAVENOUS | Status: DC
  Administered 2018-08-12 – 2018-08-13 (×2): via INTRAVENOUS

## 2018-08-12 MED ORDER — LACTATED RINGERS IV SOLN
INTRAVENOUS | Status: DC
  Administered 2018-08-12: 07:00:00 via INTRAVENOUS

## 2018-08-12 MED ORDER — SODIUM CHLORIDE (PF) 0.9 % IJ SOLN
INTRAMUSCULAR | Status: AC
  Filled 2018-08-12: qty 50

## 2018-08-12 MED ORDER — DOCUSATE SODIUM 100 MG PO CAPS
100.0000 mg | ORAL_CAPSULE | Freq: Two times a day (BID) | ORAL | Status: DC
  Administered 2018-08-12 – 2018-08-14 (×4): 100 mg via ORAL
  Filled 2018-08-12 (×4): qty 1

## 2018-08-12 MED ORDER — TAMOXIFEN CITRATE 10 MG PO TABS
10.0000 mg | ORAL_TABLET | Freq: Every day | ORAL | Status: DC
  Administered 2018-08-13 – 2018-08-14 (×2): 10 mg via ORAL
  Filled 2018-08-12 (×2): qty 1

## 2018-08-12 MED ORDER — ONDANSETRON HCL 4 MG PO TABS: 4.0000 mg | ORAL_TABLET | Freq: Four times a day (QID) | ORAL | Status: DC | PRN

## 2018-08-12 MED ORDER — ONDANSETRON HCL 4 MG/2ML IJ SOLN: 4.0000 mg | Freq: Four times a day (QID) | INTRAMUSCULAR | Status: DC | PRN

## 2018-08-12 MED ORDER — ALENDRONATE SODIUM 70 MG PO TABS
70.0000 mg | ORAL_TABLET | ORAL | Status: DC
  Filled 2018-08-12: qty 1

## 2018-08-12 MED ORDER — CALCIUM CARBONATE-VITAMIN D 500-200 MG-UNIT PO TABS
1.0000 | ORAL_TABLET | Freq: Two times a day (BID) | ORAL | Status: DC
  Administered 2018-08-12 – 2018-08-14 (×4): 1 via ORAL
  Filled 2018-08-12 (×4): qty 1

## 2018-08-12 MED ORDER — HYDROMORPHONE HCL 1 MG/ML IJ SOLN: 0.5000 mg | INTRAMUSCULAR | Status: DC | PRN

## 2018-08-12 MED ORDER — BUPIVACAINE-EPINEPHRINE (PF) 0.5% -1:200000 IJ SOLN
INTRAMUSCULAR | Status: DC | PRN
  Administered 2018-08-12: 30 mL via PERINEURAL

## 2018-08-12 MED ORDER — ASPIRIN EC 81 MG PO TBEC
81.0000 mg | DELAYED_RELEASE_TABLET | Freq: Every day | ORAL | Status: DC
  Administered 2018-08-13 – 2018-08-14 (×2): 81 mg via ORAL
  Filled 2018-08-12 (×2): qty 1

## 2018-08-12 MED ORDER — ENOXAPARIN SODIUM 40 MG/0.4ML ~~LOC~~ SOLN
40.0000 mg | SUBCUTANEOUS | Status: DC
  Administered 2018-08-13 – 2018-08-14 (×2): 40 mg via SUBCUTANEOUS
  Filled 2018-08-12 (×2): qty 0.4

## 2018-08-12 MED ORDER — BUPIVACAINE HCL (PF) 0.5 % IJ SOLN
INTRAMUSCULAR | Status: DC | PRN
  Administered 2018-08-12: 3 mL

## 2018-08-12 MED ORDER — POLYVINYL ALCOHOL 1.4 % OP SOLN
1.0000 [drp] | Freq: Every day | OPHTHALMIC | Status: DC | PRN
  Filled 2018-08-12: qty 15

## 2018-08-12 MED ORDER — PROPOFOL 500 MG/50ML IV EMUL
INTRAVENOUS | Status: AC
  Filled 2018-08-12: qty 50

## 2018-08-12 MED ORDER — ROPINIROLE HCL 1 MG PO TABS
1.0000 mg | ORAL_TABLET | Freq: Every day | ORAL | Status: DC
  Administered 2018-08-12 – 2018-08-13 (×2): 1 mg via ORAL
  Filled 2018-08-12 (×2): qty 1

## 2018-08-12 MED ORDER — METOCLOPRAMIDE HCL 10 MG PO TABS: 5.0000 mg | ORAL_TABLET | Freq: Three times a day (TID) | ORAL | Status: DC | PRN

## 2018-08-12 MED ORDER — METHOCARBAMOL 500 MG PO TABS: 750.0000 mg | ORAL_TABLET | Freq: Two times a day (BID) | ORAL | Status: DC | PRN

## 2018-08-12 MED ORDER — POTASSIUM CHLORIDE CRYS ER 20 MEQ PO TBCR
20.0000 meq | EXTENDED_RELEASE_TABLET | Freq: Two times a day (BID) | ORAL | Status: DC
  Administered 2018-08-12 – 2018-08-14 (×4): 20 meq via ORAL
  Filled 2018-08-12 (×4): qty 1

## 2018-08-12 MED ORDER — ACETAMINOPHEN 10 MG/ML IV SOLN
INTRAVENOUS | Status: AC
  Filled 2018-08-12: qty 100

## 2018-08-12 MED ORDER — ZOLPIDEM TARTRATE 5 MG PO TABS
10.0000 mg | ORAL_TABLET | Freq: Every day | ORAL | Status: DC
  Administered 2018-08-12 – 2018-08-13 (×2): 10 mg via ORAL
  Filled 2018-08-12 (×2): qty 2

## 2018-08-12 MED ORDER — SODIUM CHLORIDE (PF) 0.9 % IJ SOLN
INTRAMUSCULAR | Status: DC | PRN
  Administered 2018-08-12: 40 mL via INTRAVENOUS

## 2018-08-12 MED ORDER — METOCLOPRAMIDE HCL 5 MG/ML IJ SOLN: 5.0000 mg | Freq: Three times a day (TID) | INTRAMUSCULAR | Status: DC | PRN

## 2018-08-12 MED ORDER — ATENOLOL 100 MG PO TABS
100.0000 mg | ORAL_TABLET | Freq: Every day | ORAL | Status: DC
  Administered 2018-08-13 – 2018-08-14 (×2): 100 mg via ORAL
  Filled 2018-08-12 (×2): qty 4
  Filled 2018-08-12 (×2): qty 1

## 2018-08-12 MED ORDER — FUROSEMIDE 20 MG PO TABS
20.0000 mg | ORAL_TABLET | Freq: Two times a day (BID) | ORAL | Status: DC
  Administered 2018-08-13 – 2018-08-14 (×2): 20 mg via ORAL
  Filled 2018-08-12 (×2): qty 1

## 2018-08-12 MED ORDER — KETOROLAC TROMETHAMINE 15 MG/ML IJ SOLN
15.0000 mg | Freq: Four times a day (QID) | INTRAMUSCULAR | Status: AC
  Administered 2018-08-12 – 2018-08-13 (×4): 15 mg via INTRAVENOUS
  Filled 2018-08-12 (×3): qty 1

## 2018-08-12 MED ORDER — FENTANYL CITRATE (PF) 100 MCG/2ML IJ SOLN
INTRAMUSCULAR | Status: DC | PRN
  Administered 2018-08-12: 100 ug via INTRAVENOUS

## 2018-08-12 MED ORDER — LORATADINE 10 MG PO TABS
10.0000 mg | ORAL_TABLET | Freq: Every day | ORAL | Status: DC
  Administered 2018-08-13 – 2018-08-14 (×2): 10 mg via ORAL
  Filled 2018-08-12 (×2): qty 1

## 2018-08-12 MED ORDER — SODIUM CHLORIDE 0.9 % IV SOLN
INTRAVENOUS | Status: DC | PRN
  Administered 2018-08-12: 30 ug/min via INTRAVENOUS

## 2018-08-12 MED ORDER — FENTANYL CITRATE (PF) 100 MCG/2ML IJ SOLN: 25.0000 ug | INTRAMUSCULAR | Status: DC | PRN

## 2018-08-12 MED ORDER — ACETAMINOPHEN 10 MG/ML IV SOLN
INTRAVENOUS | Status: DC | PRN
  Administered 2018-08-12: 1000 mg via INTRAVENOUS

## 2018-08-12 MED ORDER — MAGNESIUM OXIDE 400 (241.3 MG) MG PO TABS: 400.0000 mg | ORAL_TABLET | Freq: Every day | ORAL | Status: DC | PRN

## 2018-08-12 MED ORDER — MAGNESIUM HYDROXIDE 400 MG/5ML PO SUSP
30.0000 mL | Freq: Every day | ORAL | Status: DC | PRN
  Administered 2018-08-12 – 2018-08-13 (×2): 30 mL via ORAL
  Filled 2018-08-12 (×2): qty 30

## 2018-08-12 MED ORDER — BUPIVACAINE HCL (PF) 0.5 % IJ SOLN
INTRAMUSCULAR | Status: DC | PRN
  Administered 2018-08-12: 8 mL

## 2018-08-12 MED ORDER — OXYCODONE HCL 5 MG PO TABS
5.0000 mg | ORAL_TABLET | ORAL | Status: DC | PRN
  Administered 2018-08-12 (×2): 5 mg via ORAL
  Administered 2018-08-13 (×2): 10 mg via ORAL
  Administered 2018-08-13: 5 mg via ORAL
  Administered 2018-08-14 (×2): 10 mg via ORAL
  Filled 2018-08-12 (×3): qty 2
  Filled 2018-08-12: qty 1
  Filled 2018-08-12: qty 2
  Filled 2018-08-12 (×2): qty 1

## 2018-08-12 MED ORDER — DIPHENHYDRAMINE HCL 12.5 MG/5ML PO ELIX: 12.5000 mg | ORAL_SOLUTION | ORAL | Status: DC | PRN

## 2018-08-12 MED ORDER — TRANEXAMIC ACID 1000 MG/10ML IV SOLN
INTRAVENOUS | Status: AC
  Filled 2018-08-12: qty 10

## 2018-08-12 MED ORDER — BUPIVACAINE LIPOSOME 1.3 % IJ SUSP
INTRAMUSCULAR | Status: AC
  Filled 2018-08-12: qty 20

## 2018-08-12 MED ORDER — HYDROCHLOROTHIAZIDE 25 MG PO TABS
25.0000 mg | ORAL_TABLET | ORAL | Status: DC
  Administered 2018-08-13: 25 mg via ORAL
  Filled 2018-08-12: qty 1

## 2018-08-12 MED ORDER — BUPIVACAINE HCL (PF) 0.5 % IJ SOLN
INTRAMUSCULAR | Status: AC
  Filled 2018-08-12: qty 30

## 2018-08-12 MED ORDER — FAMOTIDINE 20 MG PO TABS
20.0000 mg | ORAL_TABLET | Freq: Two times a day (BID) | ORAL | Status: DC
  Administered 2018-08-12 – 2018-08-14 (×4): 20 mg via ORAL
  Filled 2018-08-12 (×4): qty 1

## 2018-08-12 SURGICAL SUPPLY — 63 items
BANDAGE ELASTIC 6 LF NS (GAUZE/BANDAGES/DRESSINGS) ×3 IMPLANT
BLADE SAW SAG 25X90X1.19 (BLADE) ×3 IMPLANT
BLADE SURG SZ20 CARB STEEL (BLADE) ×3 IMPLANT
BNDG CMPR MED 5X6 ELC HKLP NS (GAUZE/BANDAGES/DRESSINGS) ×2
BRNG TIB 67X14 ANT STAB KN (Knees) ×2 IMPLANT
CANISTER SUCT 1200ML W/VALVE (MISCELLANEOUS) ×3 IMPLANT
CANISTER SUCT 3000ML PPV (MISCELLANEOUS) ×3 IMPLANT
CEMENT BONE R 1X40 (Cement) ×6 IMPLANT
CEMENT VACUUM MIXING SYSTEM (MISCELLANEOUS) ×3 IMPLANT
CHLORAPREP W/TINT 26ML (MISCELLANEOUS) ×3 IMPLANT
COOLER POLAR GLACIER W/PUMP (MISCELLANEOUS) ×3 IMPLANT
COVER MAYO STAND STRL (DRAPES) ×3 IMPLANT
COVER WAND RF STERILE (DRAPES) ×2 IMPLANT
CUFF TOURN 24 STER (MISCELLANEOUS) ×1 IMPLANT
CUFF TOURN 30 STER DUAL PORT (MISCELLANEOUS) IMPLANT
DRAPE IMP U-DRAPE 54X76 (DRAPES) ×3 IMPLANT
DRAPE INCISE IOBAN 66X45 STRL (DRAPES) ×3 IMPLANT
DRAPE SHEET LG 3/4 BI-LAMINATE (DRAPES) ×3 IMPLANT
DRSG OPSITE POSTOP 4X10 (GAUZE/BANDAGES/DRESSINGS) ×3 IMPLANT
DRSG OPSITE POSTOP 4X8 (GAUZE/BANDAGES/DRESSINGS) ×3 IMPLANT
ELECT CAUTERY BLADE 6.4 (BLADE) ×3 IMPLANT
ELECT REM PT RETURN 9FT ADLT (ELECTROSURGICAL) ×3
ELECTRODE REM PT RTRN 9FT ADLT (ELECTROSURGICAL) ×2 IMPLANT
GLOVE BIO SURGEON STRL SZ7.5 (GLOVE) ×12 IMPLANT
GLOVE BIO SURGEON STRL SZ8 (GLOVE) ×12 IMPLANT
GLOVE BIOGEL PI IND STRL 8 (GLOVE) ×2 IMPLANT
GLOVE BIOGEL PI INDICATOR 8 (GLOVE) ×1
GLOVE INDICATOR 8.0 STRL GRN (GLOVE) ×3 IMPLANT
GOWN STRL REUS W/ TWL LRG LVL3 (GOWN DISPOSABLE) ×2 IMPLANT
GOWN STRL REUS W/ TWL XL LVL3 (GOWN DISPOSABLE) ×2 IMPLANT
GOWN STRL REUS W/TWL LRG LVL3 (GOWN DISPOSABLE) ×3
GOWN STRL REUS W/TWL XL LVL3 (GOWN DISPOSABLE) ×3
HOLDER FOLEY CATH W/STRAP (MISCELLANEOUS) ×3 IMPLANT
HOOD PEEL AWAY FLYTE STAYCOOL (MISCELLANEOUS) ×9 IMPLANT
IMMBOLIZER KNEE 19 BLUE UNIV (SOFTGOODS) ×3 IMPLANT
KIT TURNOVER KIT A (KITS) ×3 IMPLANT
KNEE CR FEMORAL RT 62.5MM (Femur) ×1 IMPLANT
KNEE TIBIAL BEAR 67 14THK (Knees) ×1 IMPLANT
NDL SAFETY ECLIPSE 18X1.5 (NEEDLE) ×4 IMPLANT
NDL SPNL 20GX3.5 QUINCKE YW (NEEDLE) ×2 IMPLANT
NEEDLE HYPO 18GX1.5 SHARP (NEEDLE) ×6
NEEDLE SPNL 20GX3.5 QUINCKE YW (NEEDLE) ×3 IMPLANT
NS IRRIG 1000ML POUR BTL (IV SOLUTION) ×3 IMPLANT
PACK TOTAL KNEE (MISCELLANEOUS) ×3 IMPLANT
PAD WRAPON POLAR KNEE (MISCELLANEOUS) ×2 IMPLANT
PATELLA SERIES A (Orthopedic Implant) ×1 IMPLANT
PLATE INTERLOK 6700 (Plate) ×1 IMPLANT
PULSAVAC PLUS IRRIG FAN TIP (DISPOSABLE) ×3
SOL .9 NS 3000ML IRR  AL (IV SOLUTION) ×1
SOL .9 NS 3000ML IRR AL (IV SOLUTION) ×2
SOL .9 NS 3000ML IRR UROMATIC (IV SOLUTION) ×2 IMPLANT
STAPLER SKIN PROX 35W (STAPLE) ×3 IMPLANT
SUCTION FRAZIER HANDLE 10FR (MISCELLANEOUS) ×1
SUCTION TUBE FRAZIER 10FR DISP (MISCELLANEOUS) ×2 IMPLANT
SUT VIC AB 0 CT1 36 (SUTURE) ×9 IMPLANT
SUT VIC AB 2-0 CT1 27 (SUTURE) ×9
SUT VIC AB 2-0 CT1 TAPERPNT 27 (SUTURE) ×6 IMPLANT
SYR 10ML LL (SYRINGE) ×3 IMPLANT
SYR 20CC LL (SYRINGE) ×3 IMPLANT
SYR 30ML LL (SYRINGE) ×9 IMPLANT
TIP FAN IRRIG PULSAVAC PLUS (DISPOSABLE) ×2 IMPLANT
TRAY FOLEY MTR SLVR 16FR STAT (SET/KITS/TRAYS/PACK) ×3 IMPLANT
WRAPON POLAR PAD KNEE (MISCELLANEOUS) ×3

## 2018-08-12 NOTE — Transfer of Care (Signed)
Immediate Anesthesia Transfer of Care Note  Patient: Krystal [name]  Procedure(s) Performed: TOTAL KNEE ARTHROPLASTY-RIGHT (Right Knee) KNEE INJECTION-LEFT (Left Knee)  Patient Location: PACU  Anesthesia Type:Spinal  Level of Consciousness: awake and alert   Airway & Oxygen Therapy: Patient Spontanous Breathing and Patient connected to nasal cannula oxygen  Post-op Assessment: Report given to RN and Post -op Vital signs reviewed and stable  Post vital signs: Reviewed and stable  Last Vitals:  Vitals Value Taken Time  BP 93/60 08/12/2018 10:06 AM  Temp    Pulse 78 08/12/2018 10:07 AM  Resp 25 08/12/2018 10:07 AM  SpO2 92 % 08/12/2018 10:07 AM  Vitals shown include unvalidated device data.  Last Pain:  Vitals:   08/12/18 0611  TempSrc: Oral  PainSc: 5          Complications: No apparent anesthesia complications

## 2018-08-12 NOTE — Anesthesia Preprocedure Evaluation (Signed)
Anesthesia Evaluation  Patient identified by MRN, date of birth, ID band Patient awake    Reviewed: Allergy & Precautions, H&P , NPO status , Patient's Chart, lab work & pertinent test results, reviewed documented beta blocker date and time   Airway Mallampati: II   Neck ROM: full    Dental  (+) Poor Dentition   Pulmonary neg pulmonary ROS, sleep apnea ,    Pulmonary exam normal        Cardiovascular Exercise Tolerance: Good hypertension, On Medications negative cardio ROS Normal cardiovascular exam Rhythm:regular Rate:Normal     Neuro/Psych PSYCHIATRIC DISORDERS Depression  Neuromuscular disease negative neurological ROS  negative psych ROS   GI/Hepatic negative GI ROS, Neg liver ROS, GERD  Medicated,  Endo/Other  negative endocrine ROS  Renal/GU negative Renal ROS  negative genitourinary   Musculoskeletal   Abdominal   Peds  Hematology negative hematology ROS (+) Blood dyscrasia, anemia ,   Anesthesia Other Findings Past Medical History: No date: Anemia No date: Arthritis     Comment:  osteo at birth: Blindness of right eye No date: Cancer [facility])     Comment:  Left breast No date: Cataract No date: Depression 07/27/2011: Edema No date: Fibromyalgia     Comment:  muscle weakness and pain No date: GERD (gastroesophageal reflux disease) No date: Hyperlipidemia No date: Hypertension     Comment:  benign No date: Osteoporosis No date: Plantar fasciitis No date: Pre-diabetes No date: Restless leg syndrome No date: Rosacea No date: Rotator cuff rupture No date: Sleep apnea No date: Synovitis No date: Ulcer Past Surgical History: 1986: ABDOMINAL HYSTERECTOMY     Comment:  For bleeding and pain 2006: BLADDER SURGERY     Comment:  Bladder tack 09/17/2012: BREAST RECONSTRUCTION; Bilateral     Comment:  Procedure: BREAST RECONSTRUCTION;  Surgeon: Theodoro Kos, DO;  Location: [place];                Service: Plastics;  Laterality: Bilateral;  BILATERAL               BREAST RECONSTRUCTION WITH TISSUE EXPANDERS AND ALLOMED 06/03/2013: BREAST RECONSTRUCTION; Right     Comment:  Procedure: RIGHT BREAST CAPSULE CONSTRACTURE;  Surgeon:               Theodoro Kos, DO;  Location: [place];               Service: Plastics;  Laterality: Right; No date: CARPAL TUNNEL RELEASE No date: EYE SURGERY 01/29/2013: LIPOSUCTION; Bilateral     Comment:  Procedure: LIPOSUCTION;  Surgeon: Theodoro Kos, DO;                Location: [place];  Service: Plastics;               Laterality: Bilateral; 06/03/2013: LIPOSUCTION; Right     Comment:  Procedure: LIPOSUCTION;  Surgeon: Theodoro Kos, DO;                Location: [place];  Service: Plastics;               Laterality: Right; 2012: MASTECTOMY     Comment:  rt prophalactic mast-snbx 2012: MASTECTOMY MODIFIED RADICAL     Comment:  left-axillary nodes No date: NEUROMA SURGERY 2007: NOSE SURGERY No date: PORT-A-CATH REMOVAL     Comment:  insertion and  No date: THROAT SURGERY No date: UVULOPALATOPHARYNGOPLASTY BMI    Body Mass Index:  33.19 kg/m     Reproductive/Obstetrics negative OB ROS                             Anesthesia Physical Anesthesia Plan  ASA: III  Anesthesia Plan: General and Spinal   Post-op Pain Management:    Induction:   PONV Risk Score and Plan: 3 and 4 or greater  Airway Management Planned:   Additional Equipment:   Intra-op Plan:   Post-operative Plan:   Informed Consent: I have reviewed the patients History and Physical, chart, labs and discussed the procedure including the risks, benefits and alternatives for the proposed anesthesia with the patient or authorized representative who has indicated his/her understanding and acceptance.     Dental Advisory Given  Plan Discussed with:  CRNA  Anesthesia Plan Comments:         Anesthesia Quick Evaluation

## 2018-08-12 NOTE — NC FL2 (Signed)
[place] LEVEL OF CARE SCREENING TOOL     IDENTIFICATION  Patient Name: Krystal [name] Birthdate: 08-08-1950 Sex: female Admission Date (Current Location): 08/12/2018  [place] and [place] Number:  Engineering geologist and Address:  [facility], [address], [place], [place] [address]      Provider Number: [id number]  Attending Physician Name and Address:  Corky Mull, MD  Relative Name and Phone Number:       Current Level of Care: Hospital Recommended Level of Care: [place] Prior Approval Number:    Date Approved/Denied:   PASRR Number: [id number])  Discharge Plan: SNF    Current Diagnoses: Patient Active Problem List   Diagnosis Date Noted  . Status post total knee replacement using cement, right 08/12/2018  . Rotator cuff rupture   . Acquired absence of bilateral breasts and nipples 09/17/2012  . Edema 07/27/2011  . Breast cancer of lower-outer quadrant of left female breast ([place]) 01/16/2011    Orientation RESPIRATION BLADDER Height & Weight     Self, Time, Situation, Place  Normal Continent Weight: 187 lb 6.3 oz (85 kg) Height:  5\' 3"  (160 cm)  BEHAVIORAL SYMPTOMS/MOOD NEUROLOGICAL BOWEL NUTRITION STATUS      Continent Diet(Diet: Regular )  AMBULATORY STATUS COMMUNICATION OF NEEDS Skin   Extensive Assist Verbally Surgical wounds(Incision: Right Knee. )                       Personal Care Assistance Level of Assistance  Bathing, Feeding, Dressing Bathing Assistance: Limited assistance Feeding assistance: Independent Dressing Assistance: Limited assistance     Functional Limitations Info  Sight, Hearing, Speech Sight Info: Adequate Hearing Info: Adequate Speech Info: Adequate    SPECIAL CARE FACTORS FREQUENCY  PT (By licensed PT), OT (By licensed OT)     PT Frequency: (5) OT Frequency: (5)            Contractures      Additional Factors Info  Code Status, Allergies Code  Status Info: (Full Code. ) Allergies Info: (Cephalexin, Levaquin Levofloxacin)           Current Medications (08/12/2018):  This is the current hospital active medication list Current Facility-Administered Medications  Medication Dose Route Frequency Provider Last Rate Last Dose  . 0.9 %  sodium chloride infusion   Intravenous Continuous Poggi, Marshall Cork, MD 75 mL/hr at 08/12/18 1311    . acetaminophen (TYLENOL) tablet 1,000 mg  1,000 mg Oral Q6H Poggi, Marshall Cork, MD   1,000 mg at 08/12/18 1303  . [START ON 08/13/2018] acetaminophen (TYLENOL) tablet 325-650 mg  325-650 mg Oral Q6H PRN Poggi, Marshall Cork, MD      . Derrill Memo ON 08/16/2018] alendronate (FOSAMAX) tablet 70 mg  70 mg Oral Q Sat Poggi, Marshall Cork, MD      . Derrill Memo ON 08/13/2018] amLODipine (NORVASC) tablet 10 mg  10 mg Oral Daily Poggi, Marshall Cork, MD      . Derrill Memo ON 08/13/2018] aspirin EC tablet 81 mg  81 mg Oral Daily Poggi, Marshall Cork, MD      . Derrill Memo ON 08/13/2018] atenolol (TENORMIN) tablet 100 mg  100 mg Oral Daily Poggi, Marshall Cork, MD      . bisacodyl (DULCOLAX) suppository 10 mg  10 mg Rectal Daily PRN Poggi, Marshall Cork, MD      . calcium-vitamin D (OSCAL WITH D) 500-200 MG-UNIT per tablet 1 tablet  1 tablet Oral BID Poggi,  Marshall Cork, MD      . cholecalciferol (VITAMIN D3) tablet 5,000 Units  5,000 Units Oral QPM Poggi, Marshall Cork, MD      . diphenhydrAMINE (BENADRYL) 12.5 MG/5ML elixir 12.5-25 mg  12.5-25 mg Oral Q4H PRN Poggi, Marshall Cork, MD      . diphenhydrAMINE (BENADRYL) capsule 25 mg  25 mg Oral QHS PRN Poggi, Marshall Cork, MD      . docusate sodium (COLACE) capsule 100 mg  100 mg Oral BID Poggi, Marshall Cork, MD      . Derrill Memo ON 08/13/2018] enoxaparin (LOVENOX) injection 40 mg  40 mg Subcutaneous Q24H Poggi, Marshall Cork, MD      . famotidine (PEPCID) tablet 20 mg  20 mg Oral BID Poggi, Marshall Cork, MD      . fluticasone (FLONASE) 50 MCG/ACT nasal spray 2 spray  2 spray Each Nare BID PRN Poggi, Marshall Cork, MD      . furosemide (LASIX) tablet 20 mg  20 mg Oral BID Poggi, Marshall Cork, MD       . Derrill Memo ON 08/13/2018] lisinopril (PRINIVIL,ZESTRIL) tablet 20 mg  20 mg Oral QODAY Poggi, Marshall Cork, MD       And  . Derrill Memo ON 08/13/2018] hydrochlorothiazide (HYDRODIURIL) tablet 25 mg  25 mg Oral QODAY Poggi, Marshall Cork, MD      . HYDROmorphone (DILAUDID) injection 0.5-1 mg  0.5-1 mg Intravenous Q4H PRN Poggi, Marshall Cork, MD      . ketorolac (TORADOL) 15 MG/ML injection 15 mg  15 mg Intravenous Q6H Poggi, Marshall Cork, MD   15 mg at 08/12/18 1039  . ketorolac (TORADOL) 15 MG/ML injection           . [START ON 08/13/2018] loratadine (CLARITIN) tablet 10 mg  10 mg Oral Daily Poggi, Marshall Cork, MD      . magnesium hydroxide (MILK OF MAGNESIA) suspension 30 mL  30 mL Oral Daily PRN Poggi, Marshall Cork, MD   30 mL at 08/12/18 1304  . magnesium oxide (MAG-OX) tablet 400 mg  400 mg Oral Daily PRN Poggi, Marshall Cork, MD      . methocarbamol (ROBAXIN) tablet 750 mg  750 mg Oral BID PRN Poggi, Marshall Cork, MD      . metoCLOPramide (REGLAN) tablet 5-10 mg  5-10 mg Oral Q8H PRN Poggi, Marshall Cork, MD       Or  . metoCLOPramide (REGLAN) injection 5-10 mg  5-10 mg Intravenous Q8H PRN Poggi, Marshall Cork, MD      . ondansetron (ZOFRAN) tablet 4 mg  4 mg Oral Q6H PRN Poggi, Marshall Cork, MD       Or  . ondansetron (ZOFRAN) injection 4 mg  4 mg Intravenous Q6H PRN Poggi, Marshall Cork, MD      . oxyCODONE (Oxy IR/ROXICODONE) immediate release tablet 5-10 mg  5-10 mg Oral Q4H PRN Poggi, Marshall Cork, MD   5 mg at 08/12/18 1308  . [START ON 08/13/2018] pneumococcal 23 valent vaccine (PNU-IMMUNE) injection 0.5 mL  0.5 mL Intramuscular Tomorrow-1000 Poggi, Marshall Cork, MD      . polyvinyl alcohol (LIQUIFILM TEARS) 1.4 % ophthalmic solution 1 drop  1 drop Both Eyes Daily PRN Poggi, Marshall Cork, MD      . potassium chloride SA (K-DUR,KLOR-CON) CR tablet 20 mEq  20 mEq Oral BID Poggi, Marshall Cork, MD      . pregabalin (LYRICA) capsule 300 mg  300 mg Oral BID Poggi, Marshall Cork, MD      .  rOPINIRole (REQUIP) tablet 1 mg  1 mg Oral QHS Poggi, Marshall Cork, MD      . sodium phosphate (FLEET) 7-19 GM/118ML  enema 1 enema  1 enema Rectal Once PRN Poggi, Marshall Cork, MD      . Derrill Memo ON 08/13/2018] tamoxifen (NOLVADEX) tablet 10 mg  10 mg Oral Daily Poggi, Marshall Cork, MD      . traMADol Veatrice Bourbon) tablet 50 mg  50 mg Oral Q6H PRN Poggi, Marshall Cork, MD      . triamcinolone (KENALOG) 4.707 % cream 1 application  1 application Topical Daily PRN Poggi, Marshall Cork, MD      . vancomycin (VANCOCIN) IVPB 1000 mg/200 mL premix  1,000 mg Intravenous Q12H Poggi, Marshall Cork, MD      . zolpidem (AMBIEN) tablet 10 mg  10 mg Oral QHS Poggi, Marshall Cork, MD         Discharge Medications: Please see discharge summary for a list of discharge medications.  Relevant Imaging Results:  Relevant Lab Results:   Additional Information (SSN: [id number])  Kinsly Hild, Veronia Beets, LCSW

## 2018-08-12 NOTE — Evaluation (Signed)
Physical Therapy Evaluation Patient Details Name: Krystal [name] MRN: [id number] DOB: [birth year] Today's Date: 08/12/2018   History of Present Illness  Pt is a 55 y F s/p R TKA.  Pt's PMH includes osteoporosis, fibromyalgia, blindness R eye, breast cancer.  Clinical Impression  Krystal [name] was admitted for the above description. Pt confirmed name and DOB without concern upon arrival of PT. Pt stated that she has stairs to get inside of her home as well as to get to the shower but her husband will be able to provide care 24/7. Pt was eager to get OOB this session and was able to ambulate 80 ft without requiring any rest breaks. Pt did not report any dizziness or SOB throughout session. PT is recommending HHPT upon discharge with a focus on pt's strength deficits, ROM deficits, pain, and negotiating stairs.     Follow Up Recommendations Home health PT    Equipment Recommendations  Rolling walker with 5" wheels    Recommendations for Other Services       Precautions / Restrictions Precautions Precautions: Fall;Knee Precaution Booklet Issued: Yes (comment) Precaution Comments: Instructed pt in no pillow under knee and provided exercise handout Required Braces or Orthoses: (KI in room but no orders found in chart) Restrictions Weight Bearing Restrictions: Yes RLE Weight Bearing: Weight bearing as tolerated      Mobility  Bed Mobility Overal bed mobility: Needs Assistance Bed Mobility: Supine to Sit     Supine to sit: Min guard;HOB elevated     General bed mobility comments: Pt was able to go sup>sit at a decreased speed with no physical assist from PT. At EOB pt immediately stood up without safety belt and was instructed to sit on the bed per PT. PT provided VC's for transition sequence and Min guard for safety.   Transfers Overall transfer level: Needs assistance Equipment used: Rolling walker (2 wheeled) Transfers: Sit to/from [facility] Sit to Stand: Min  guard Stand pivot transfers: Min guard       General transfer comment: Pt stated that she need to use the [facility] and went STS without the use of the RW. Pt instructed to sit on bed per PT. When pt used RW she demonstrated safe management to [facility].   Ambulation/Gait Ambulation/Gait assistance: Min guard Gait Distance (Feet): 80 Feet Assistive device: Rolling walker (2 wheeled) Gait Pattern/deviations: Decreased stance time - right;Decreased step length - left;Decreased step length - right;Decreased dorsiflexion - right Gait velocity: decreased    General Gait Details: Pt ambulated 80 ft without any rest breaks. VC's were required for pt to keep head up and to keep RW on floor at all times.   Stairs            Wheelchair Mobility    Modified Rankin (Stroke Patients Only)       Balance Overall balance assessment: Needs assistance Sitting-balance support: Bilateral upper extremity supported;Feet supported Sitting balance-Leahy Scale: Fair Sitting balance - Comments: PT Min guard for pt safety.    Standing balance support: Bilateral upper extremity supported;During functional activity Standing balance-Leahy Scale: Poor Standing balance comment: Pt requires use of BUE to remain steady.                              Pertinent Vitals/Pain Pain Assessment: 0-10 Pain Score: 5  Pain Location: R knee  Pain Descriptors / Indicators: Aching;Discomfort;Grimacing;Guarding Pain Intervention(s): Monitored during session;Other (comment);Limited activity within patient's tolerance(polar  care)    Home Living Family/patient expects to be discharged to:: Private residence Living Arrangements: Spouse/significant other Available Help at Discharge: Family;Available 24 hours/day(husband retired) Type of Home: House Home Access: Stairs to enter Entrance Stairs-Rails: Right Entrance Stairs-Number of Steps: 3 Home Layout: Two level Home Equipment: None Additional Comments: Husband  to put blow up memory foam mattress downstairs.  However, no shower downstairs so pt will need to be able to navigate flight of steps to shower.     Prior Function Level of Independence: Independent         Comments: 2 falls in the past 6 months.  Ambulating without AD.  Pt able to ambulate community distances but this would experience pain with this.      Hand Dominance        Extremity/Trunk Assessment   Upper Extremity Assessment Upper Extremity Assessment: Overall WFL for tasks assessed    Lower Extremity Assessment Lower Extremity Assessment: RLE deficits/detail RLE Deficits / Details: Unable to formally test s/p TKA. Functionally strength appears to be a 3/5.  RLE Sensation: WNL    Cervical / Trunk Assessment Cervical / Trunk Assessment: Normal  Communication   Communication: No difficulties  Cognition Arousal/Alertness: Awake/alert Behavior During Therapy: WFL for tasks assessed/performed Overall Cognitive Status: Within Functional Limits for tasks assessed                                 General Comments: Pt eager to get out of bed. Demonstrates knowledge of her limitations as she states she did not want to 'over do it' with todays session.       General Comments General comments (skin integrity, edema, etc.): BP 111/62 in supine. Pt denies any chest pain, SOB, or dizziness throughout session.     Exercises Total Joint Exercises Ankle Circles/Pumps: AROM;Both;10 reps;Supine Heel Slides: AROM;Right;Supine;Seated;10 reps Knee Flexion: AROM;Right;5 reps;Seated(with 5 second holds.) Goniometric ROM: -3 to 96   Assessment/Plan    PT Assessment Patient needs continued PT services  PT Problem List Decreased range of motion;Decreased strength;Decreased activity tolerance;Decreased mobility;Decreased balance;Decreased knowledge of use of DME;Decreased safety awareness;Decreased knowledge of precautions;Pain       PT Treatment Interventions DME  instruction;Gait training;Stair training;Functional mobility training;Therapeutic activities;Therapeutic exercise;Balance training;Neuromuscular re-education;Patient/family education;Modalities    PT Goals (Current goals can be found in the Care Plan section)  Acute Rehab PT Goals Patient Stated Goal: To decrease pain and return home  PT Goal Formulation: With patient Time For Goal Achievement: 08/26/18 Potential to Achieve Goals: Good    Frequency BID   Barriers to discharge        Co-evaluation               AM-PAC PT "6 Clicks" Mobility  Outcome Measure Help needed turning from your back to your side while in a flat bed without using bedrails?: A Little Help needed moving from lying on your back to sitting on the side of a flat bed without using bedrails?: A Little Help needed moving to and from a bed to a chair (including a wheelchair)?: A Little Help needed standing up from a chair using your arms (e.g., wheelchair or bedside chair)?: A Little Help needed to walk in hospital room?: A Little Help needed climbing 3-5 steps with a railing? : A Lot 6 Click Score: 17    End of Session Equipment Utilized During Treatment: Gait belt Activity Tolerance: Patient tolerated treatment well Patient  left: in chair;with call bell/phone within reach;with chair alarm set;with family/visitor present;with SCD's reapplied(polar care ) Nurse Communication: Mobility status;Other (comment)(Void with BSC) PT Visit Diagnosis: Unsteadiness on feet (R26.81);Other abnormalities of gait and mobility (R26.89);Repeated falls (R29.6);Muscle weakness (generalized) (M62.81);History of falling (Z91.81);Pain Pain - Right/Left: Right Pain - part of body: Knee    Time: 1447-1530 PT Time Calculation (min) (ACUTE ONLY): 43 min   Charges:   PT Evaluation $PT Eval Low Complexity: 1 Low PT Treatments $Gait Training: 8-22 mins $Therapeutic Exercise: 8-22 mins      Dorothy Spark, SPT   08/12/2018,  4:46 PM

## 2018-08-12 NOTE — Anesthesia Procedure Notes (Signed)
Spinal  Patient location during procedure: OR Start time: 08/12/2018 7:30 AM End time: 08/12/2018 7:36 AM Staffing Resident/CRNA: Bernardo Heater, CRNA Preanesthetic Checklist Completed: patient identified, site marked, surgical consent, pre-op evaluation, timeout performed, IV checked, risks and benefits discussed and monitors and equipment checked Spinal Block Patient position: sitting Prep: ChloraPrep Patient monitoring: heart rate, continuous pulse ox, blood pressure and cardiac monitor Approach: midline Location: L4-5 Injection technique: single-shot Needle Needle type: Introducer and Pencan  Needle gauge: 24 G Needle length: 9 cm Additional Notes Negative paresthesia. Negative blood return. Positive free-flowing CSF. Expiration date of kit checked and confirmed. Patient tolerated procedure well, without complications.

## 2018-08-12 NOTE — Progress Notes (Signed)
   08/12/18 1400  Clinical Encounter Type  Visited With Patient and family together  Visit Type Initial    Chaplain visited with the patient as a recommendation from unit secretary as she was a new patient on the unit. Patient was alert, pleasant, and welcoming. She is in good spirits and notes that she is being well cared for by staff; her surgery went well this morning also. Husband was at the bedside as we talked. Patient indicates that she has been in communication with her priest/minister and notes that she has a flourishing faith community and active role there. 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 She said that she plans to be here for another day or so.

## 2018-08-12 NOTE — Op Note (Signed)
08/12/2018  10:04 AM  Patient:   Krystal [name]  Pre-Op Diagnosis:   Degenerative joint disease, right knee.  Post-Op Diagnosis:   Same  Procedure:   Right TKA using all-cemented [employer] Vanguard system with a 62.5 mm PCR femur, a 67 mm tibial tray with a 14 mm anterior stabilized E-poly insert, and a 31 x 8 mm all-poly 3-pegged domed patella.  Surgeon:   Pascal Lux, MD  Assistant:   Cameron Proud, PA-C   Anesthesia:   Spinal  Findings:   As above  Complications:   None  EBL:   10 cc  Fluids:   1000 cc crystalloid  UOP:   700 cc  TT:   100 minutes at 300 mmHg  Drains:   None  Closure:   Staples  Implants:   As above  Brief Clinical Note:   The patient is a 68 year old female with a long history of progressively worsening right knee pain. The patient's symptoms have progressed despite medications, activity modification, injections, etc. The patient's history and examination were consistent with advanced degenerative joint disease of the right knee confirmed by plain radiographs. The patient presents at this time for a right total knee arthroplasty.  Procedure:   The patient was brought into the operating room. After adequate spinal anesthesia was obtained, the patient was lain in the supine position. A Foley catheter was placed by the nurse before the right lower extremity was prepped with ChloraPrep solution and draped sterilely. Preoperative antibiotics were administered. After verifying the proper laterality with a surgical timeout, the limb was exsanguinated with an Esmarch and the tourniquet inflated to 300 mmHg. A standard anterior approach to the knee was made through an approximately 7 inch incision. The incision was carried down through the subcutaneous tissues to expose superficial retinaculum. This was split the length of the incision and the medial flap elevated sufficiently to expose the medial retinaculum. The medial retinaculum was incised, leaving a 3-4 mm  cuff of tissue on the patella. This was extended distally along the medial border of the patellar tendon and proximally through the medial third of the quadriceps tendon. A subtotal fat pad excision was performed before the soft tissues were elevated off the anteromedial and anterolateral aspects of the proximal tibia to the level of the collateral ligaments. The anterior portions of the medial and lateral menisci were removed, as was the anterior cruciate ligament. With the knee flexed to 90, the external tibial guide was positioned and the appropriate proximal tibial cut made. This piece was taken to the back table where it was measured and found to be optimally replicated by a 67 mm component.  Attention was directed to the distal femur. The intramedullary canal was accessed through a 3/8" drill hole. The intramedullary guide was inserted and positioned in order to obtain a neutral flexion gap. The intercondylar block was positioned with care taken to avoid notching the anterior cortex of the femur. The appropriate cut was made. Next, the distal cutting block was placed at 6 of valgus alignment. Using the 9 mm slot, the distal cut was made. The distal femur was measured and found to be optimally replicated by the 38.1 mm component. The 62.5 mm 4-in-1 cutting block was positioned and first the posterior, then the posterior chamfer, the anterior chamfer, and finally the anterior cuts were made. At this point, the posterior portions medial and lateral menisci were removed. A trial reduction was performed using the appropriate femoral and tibial components with  the 10 mm, the 12 mm, and finally the 14 mm insert. The 14 mm insert demonstrated excellent stability to varus and valgus stressing both in flexion and extension while permitting full extension. Patella tracking was assessed and found to be excellent. Therefore, the tibial guide position was marked on the proximal tibia. The patella thickness was measured  and found to be 20 mm. Therefore, the appropriate cut was made. The patellar surface was measured and found to be optimally replicated by the 31 mm component. The three peg holes were drilled in place before the trial button was inserted. Patella tracking was assessed and found to be excellent, passing the "no thumb test". The lug holes were drilled into the distal femur before the trial component was removed, leaving only the tibial tray. The keel was then created using the appropriate tower, reamer, and punch.  The bony surfaces were prepared for cementing by irrigating them thoroughly with bacitracin saline solution via the jet lavage system. A bone plug was fashioned from some of the bone that had been removed previously and used to plug the distal femoral canal. In addition, 20 cc of Exparel diluted out to 60 cc with normal saline and 30 cc of 0.5% Sensorcaine were injected into the postero-medial and postero-lateral aspects of the knee, the medial and lateral gutter regions, and the peri-incisional tissues to help with postoperative analgesia. Meanwhile, the cement was being mixed on the back table. When it was ready, the tibial tray was cemented in first. The excess cement was removed using [employer] fast streamer. Next, the femoral component was impacted into place. Again, the excess cement was removed using [employer] fast streamer. The 14 mm trial insert was positioned and the knee brought into extension while the cement hardened. Finally, the patella was cemented into place and secured using the patellar clamp. Again, the excess cement was removed using [employer] fast streamer. Once the cement had hardened, the knee was placed through a range of motion with the findings as described above. Therefore, the trial insert was removed and, after verifying that no cement had been retained posteriorly, the permanent 14 mm anterior stabilized E-polyethylene insert was positioned and secured using the appropriate key locking mechanism.  Again the knee was placed through a range of motion with the findings as described above.  The wound was copiously irrigated with bacitracin saline solution using the jet lavage system before the quadriceps tendon and retinacular layer were reapproximated using #0 Vicryl interrupted sutures. The superficial retinacular layer also was closed using a running #0 Vicryl suture. A total of 10 cc of transexemic acid (TXA) was injected intra-articularly before the subcutaneous tissues were closed in several layers using 2-0 Vicryl interrupted sutures. The skin was closed using staples. A sterile honeycomb dressing was applied to the skin before the leg was wrapped with an Ace wrap to accommodate the Polar Care device. The patient was then awakened and returned to the recovery room in satisfactory condition after tolerating the procedure well.

## 2018-08-12 NOTE — Care Management (Signed)
[facility] accepted the patient for [facility] PT

## 2018-08-12 NOTE — H&P (Signed)
Paper H&P to be scanned into permanent record. H&P reviewed and patient re-examined. No changes. 

## 2018-08-12 NOTE — Care Management Note (Signed)
Case Management Note  Patient Details  Name: Krystal [name] MRN: [id number] Date of Birth: 1950-11-11  Subjective/Objective:                  Met with Patient and husband to discuss DC plan, Patient will need a rolling walker, notified [facility] of this need Patient provided with the [facility] list per CMS.gov and chooses [facility], notified Helene Kelp with [facility], Patient will go home on Lovenox, I will check the price before she discharges Patient has transportation at home with husband Patient uses [facility] [address] and can afford the medication  Action/Plan:  Provided with [facility] list per CMS.gov Notified [facility] of rW need, notified [facility] choice  Expected Discharge Date:                  Expected Discharge Plan:     In-House Referral:     Discharge planning Services  CM Consult  Post Acute Care Choice:  Durable Medical Equipment, Home Health Choice offered to:  Patient  DME Arranged:  Walker rolling DME Agency:  [place]:  PT [place]:  [facility] (now [facility])  Status of Service:  In process, will continue to follow  If discussed at Long Length of Stay Meetings, dates discussed:    Additional Comments:  Su Hilt, RN 08/12/2018, 4:34 PM

## 2018-08-12 NOTE — Anesthesia Post-op Follow-up Note (Signed)
Anesthesia QCDR form completed.        

## 2018-08-13 ENCOUNTER — Encounter: Payer: Self-pay | Admitting: Surgery

## 2018-08-13 LAB — CBC WITH DIFFERENTIAL/PLATELET
Abs Immature Granulocytes: 0.1 10*3/uL — ABNORMAL HIGH (ref 0.00–0.07)
Basophils Absolute: 0 10*3/uL (ref 0.0–0.1)
Basophils Relative: 0 %
Eosinophils Absolute: 0 10*3/uL (ref 0.0–0.5)
Eosinophils Relative: 0 %
HCT: 39.8 % (ref 36.0–46.0)
Hemoglobin: 13.7 g/dL (ref 12.0–15.0)
Immature Granulocytes: 1 %
Lymphocytes Relative: 5 %
Lymphs Abs: 0.9 10*3/uL (ref 0.7–4.0)
MCH: 31.9 pg (ref 26.0–34.0)
MCHC: 34.4 g/dL (ref 30.0–36.0)
MCV: 92.8 fL (ref 80.0–100.0)
Monocytes Absolute: 0.8 10*3/uL (ref 0.1–1.0)
Monocytes Relative: 5 %
NEUTROS ABS: 14.7 10*3/uL — AB (ref 1.7–7.7)
Neutrophils Relative %: 89 %
Platelets: 285 10*3/uL (ref 150–400)
RBC: 4.29 MIL/uL (ref 3.87–5.11)
RDW: 12.9 % (ref 11.5–15.5)
WBC: 16.5 10*3/uL — ABNORMAL HIGH (ref 4.0–10.5)
nRBC: 0 % (ref 0.0–0.2)

## 2018-08-13 LAB — BASIC METABOLIC PANEL
Anion gap: 9 (ref 5–15)
BUN: 15 mg/dL (ref 8–23)
CO2: 25 mmol/L (ref 22–32)
CREATININE: 1.11 mg/dL — AB (ref 0.44–1.00)
Calcium: 8.7 mg/dL — ABNORMAL LOW (ref 8.9–10.3)
Chloride: 101 mmol/L (ref 98–111)
GFR calc Af Amer: >60 mL/min (ref >60)
GFR calc non Af Amer: 52 mL/min — ABNORMAL LOW (ref >60)
Glucose, Bld: 166 mg/dL — ABNORMAL HIGH (ref 70–99)
Potassium: 3.8 mmol/L (ref 3.5–5.1)
Sodium: 135 mmol/L (ref 135–145)

## 2018-08-13 NOTE — Plan of Care (Signed)

## 2018-08-13 NOTE — Progress Notes (Signed)
Clinical Social Worker (CSW) received SNF consult. PT is recommending home health. RN case manager aware of above. Please reconsult if future social work needs arise. CSW signing off.   Callia Swim, LCSW [phone] 

## 2018-08-13 NOTE — Progress Notes (Signed)
PT Progress Note  PT Assessment: [name] was able to ambulate 120 ft in today's session. She however presents with gait deviations and  decreased tolerance. During treatment session yesterday pt was able to perform ankle pumps bilaterally with no difficulties, today pt demonstrates 0/5 strength of R dorsiflexors. Throughout gait pt posteriorly leaned with swing phase of RLE, likely due to the 0/5 strength of dorsiflexors and inability to clear her foot. RN and MD notified of new findings. Pt denied SOB or dizziness throughout session. PT to assess dorsiflexor strength and gait this afternoon.     08/13/18 1100  PT Visit Information  Last PT Received On 08/13/18  Assistance Needed +1  History of Present Illness Pt is a 46 y F s/p R TKA.  Pt's PMH includes osteoporosis, fibromyalgia, blindness R eye, breast cancer.  Subjective Data  Subjective Pt states she is feeling more pain today.   Patient Stated Goal To decrease pain and return home   Precautions  Precautions Fall;Knee  Precaution Comments Instructed pt in no pillow under knee and provided exercise handout  Required Braces or Orthoses  (KI in room but no orders in chart)  Restrictions  Weight Bearing Restrictions Yes  RLE Weight Bearing WBAT  Pain Assessment  Pain Assessment 0-10  Pain Score 6  Pain Location R knee   Pain Descriptors / Indicators Aching;Discomfort;Grimacing;Guarding;Sore  Pain Intervention(s) Monitored during session;Other (comment) (polar care )  Cognition  Arousal/Alertness Awake/alert  Behavior During Therapy WFL for tasks assessed/performed  Overall Cognitive Status Within Functional Limits for tasks assessed  General Comments Pt highly motivated to willing to work with PT. Pt became emotional towards end of session. Pt says shes okay but tends to be more emotional at baseline.   Bed Mobility  Overal bed mobility Needs Assistance  Bed Mobility Sit to Supine  Sit to supine Min guard  General bed mobility  comments Pt was able to go from sit>sup with Min guard for safety. Pt reported it was painful to move both legs onto bed at the same time. PT educated it might be less painful to move one leg at a time.   Transfers  Overall transfer level Needs assistance  Equipment used Rolling walker (2 wheeled)  Transfers Sit to/from Stand  Sit to Stand Min guard  General transfer comment Pt was able to independtly sit<>stand with Min guard for safety. Pt demonstrated proper use of RW with transfers.   Ambulation/Gait  Ambulation/Gait assistance Min guard  Gait Distance (Feet) 120 Feet  Assistive device Rolling walker (2 wheeled)  Gait Pattern/deviations Step-to pattern;Decreased step length - right;Decreased step length - left;Decreased stance time - right;Decreased dorsiflexion - right;Leaning posteriorly  General Gait Details Pt ambulated 120 ft without requiring any rest breaks. A step-to pattern was noticed and pt was able to correct with PT VC. Of note pt leaned posteriorly with swing through phase of RLE likely due to lack of dorsiflexion. Pt required multiple VC to keep head up throughout gait.  Gait velocity decreased   Balance  Overall balance assessment Needs assistance  Sitting-balance support Bilateral upper extremity supported;Feet supported  Sitting balance-Leahy Scale Fair  Sitting balance - Comments PT Min guard for pt safety.   Standing balance support Bilateral upper extremity supported;During functional activity  Standing balance-Leahy Scale Poor  Standing balance comment Pt requires use of BUE to remain steady. PT min guard for safety,  General Comments  General comments (skin integrity, edema, etc.) Pt lacked R dorsiflexion entirely with  ambulation when it was completely intact yesterday. Pt denied any chest pain, dizziness, or SOB while ambulating. PT assessed sensation of UE/LE and  it was intact. Pt states sensations feel the same on both sides. General strength of UE assessed and  no deficits noted. RN and MD were notified of this.   Exercises  Exercises General Upper Extremity;General Lower Extremity  Total Joint Exercises  Knee Flexion AROM;Right;10 reps;Seated (5 second holds.)  Goniometric ROM -1-88  General Exercises - Lower Extremity  Quad Sets AROM;Right;10 reps;Supine (5 second holds)  Hip ABduction/ADduction AROM;Right;10 reps;Supine  Straight Leg Raises AROM;Right;5 reps;Supine (provided VC for smooth movement)  PT - End of Session  Equipment Utilized During Treatment Gait belt  Activity Tolerance Patient tolerated treatment well  Patient left in bed;with call bell/phone within reach;with bed alarm set;with family/visitor present;with SCD's reapplied (Polar care, towel roll)  Nurse Communication Mobility status;Other (comment) (0/5 dorsiflexion)   PT - Assessment/Plan  PT Plan Current plan remains appropriate  PT Visit Diagnosis Unsteadiness on feet (R26.81);Other abnormalities of gait and mobility (R26.89);Repeated falls (R29.6);Muscle weakness (generalized) (M62.81);History of falling (Z91.81);Pain  Pain - Right/Left Right  Pain - part of body Knee  PT Frequency (ACUTE ONLY) BID  Follow Up Recommendations Home health PT  PT equipment Rolling walker with 5" wheels  AM-PAC PT "6 Clicks" Mobility Outcome Measure (Version 2)  Help needed turning from your back to your side while in a flat bed without using bedrails? 3  Help needed moving from lying on your back to sitting on the side of a flat bed without using bedrails? 3  Help needed moving to and from a bed to a chair (including a wheelchair)? 3  Help needed standing up from a chair using your arms (e.g., wheelchair or bedside chair)? 3  Help needed to walk in hospital room? 3  Help needed climbing 3-5 steps with a railing?  2  6 Click Score 17  Consider Recommendation of Discharge To: Home with [facility]  PT Goal Progression  Progress towards PT goals Progressing toward goals (However new 0/5 R  dorsiflexion)  Acute Rehab PT Goals  PT Goal Formulation With patient  Time For Goal Achievement 08/26/18  Potential to Achieve Goals Good  PT Time Calculation  PT Start Time (ACUTE ONLY) 0949  PT Stop Time (ACUTE ONLY) 1046  PT Time Calculation (min) (ACUTE ONLY) 57 min  PT General Charges  $$ ACUTE PT VISIT 1 Visit  PT Treatments  $Gait Training 23-37 mins  $Therapeutic Exercise 8-22 mins  $Therapeutic Activity 8-22 mins   Dorothy Spark, SPT

## 2018-08-13 NOTE — Progress Notes (Addendum)
Subjective: 1 Day Post-Op Procedure(s) (LRB): TOTAL KNEE ARTHROPLASTY-RIGHT (Right) KNEE INJECTION-LEFT (Left) Patient reports pain as mild.   Patient is well, and has had no acute complaints or problems PT and care management to assist with discharge planning. Negative for chest pain and shortness of breath Fever: no Gastrointestinal:Negative for nausea and vomiting  Objective: Vital signs in last 24 hours: Temp:  [97.4 F (36.3 C)-98.8 F (37.1 C)] 97.9 F (36.6 C) (01/22 0332) Pulse Rate:  [60-78] 62 (01/22 0332) Resp:  [16-25] 19 (01/22 0332) BP: (93-134)/(50-71) 129/70 (01/22 0332) SpO2:  [92 %-99 %] 99 % (01/22 0332)  Intake/Output from previous day:  Intake/Output Summary (Last 24 hours) at 08/13/2018 0758 Last data filed at 08/13/2018 8676 Gross per 24 hour  Intake 3252.94 ml  Output 3310 ml  Net -57.06 ml    Intake/Output this shift: No intake/output data recorded.  Labs: Recent Labs    08/13/18 0522  HGB 13.7   Recent Labs    08/13/18 0522  WBC 16.5*  RBC 4.29  HCT 39.8  PLT 285   Recent Labs    08/12/18 0626 08/13/18 0522  NA 130* 135  K 3.1* 3.8  CL 92* 101  CO2 27 25  BUN 17 15  CREATININE 1.12* 1.11*  GLUCOSE 115* 166*  CALCIUM 8.7* 8.7*   No results for input(s): LABPT, INR in the last 72 hours.   EXAM General - Patient is Alert, Appropriate and Oriented Extremity - ABD soft Sensation intact distally Intact pulses distally Dorsiflexion/Plantar flexion intact Incision: moderate drainage No cellulitis present Dressing/Incision - blood tinged drainage Motor Function - intact, moving foot and toes well on exam.  Abdomen soft this AM, normal BS.  Past Medical History:  Diagnosis Date  . Anemia   . Arthritis    osteo  . Blindness of right eye at birth  . Cancer ([place])    Left breast  . Cataract   . Depression   . Edema 07/27/2011  . Fibromyalgia    muscle weakness and pain  . GERD (gastroesophageal reflux disease)   .  Hyperlipidemia   . Hypertension    benign  . Osteoporosis   . Plantar fasciitis   . Pre-diabetes   . Restless leg syndrome   . Rosacea   . Rotator cuff rupture   . Sleep apnea   . Synovitis   . Ulcer     Assessment/Plan: 1 Day Post-Op Procedure(s) (LRB): TOTAL KNEE ARTHROPLASTY-RIGHT (Right) KNEE INJECTION-LEFT (Left) Active Problems:   Status post total knee replacement using cement, right   Rotator cuff rupture  Estimated body mass index is 33.19 kg/m as calculated from the following:   Height as of this encounter: 5\' 3"  (1.6 m).   Weight as of this encounter: 85 kg. Advance diet Up with therapy D/C IV fluids when tolerating po intake.  Labs reviewed this AM, WBC 16.5, no recent fevers.  CBC and BMP ordered for tomorrow morning. Reports increase in urine frequency this AM. Up with therapy today. Begin working on BM, patient is passing gas without pain. Plan for possible discharge home tomorrow pending progress with PT today.  DVT Prophylaxis - Lovenox, Foot Pumps and TED hose Weight-Bearing as tolerated to right leg  Addendum 12:08 pm, 08/13/18 Patient now complaining of foot drop to the right foot,unable to actively dorsiflex, denies any pain.  Able to flex and extend toes well and is intact to light touch to the toes but does report decreased sensation to the  dorsum of the foot.  WIll continue to monitor, ACE wrap was removed from the right knee.  Raquel , PA-C [facility] 08/13/2018, 7:58 AM

## 2018-08-13 NOTE — Progress Notes (Signed)
Physical Therapy Treatment Patient Details Name: Krystal [name] MRN: [id number] DOB: [birth year] Today's Date: 08/13/2018    History of Present Illness Pt is a 66 y F s/p R TKA.  Pt developed R foot drop and numbness/tingling in RLE on 1/22, MD aware. Pt's PMH includes osteoporosis, fibromyalgia, blindness R eye, breast cancer.    PT Comments     Ms. Rickles made good progress toward mobility goals, ambulating around nurses station with cues for improved gait mechanics and posture.  Pt with new numbness to RLE which pt reports she has already spoken to MD about this, RN already aware as well.  Strength remains unchanged since this morning's PT session.  Pt requires cues for safety with transfers.  Follow up recommendations remain appropriate.   Follow Up Recommendations  Home health PT     Equipment Recommendations  Rolling walker with 5" wheels    Recommendations for Other Services       Precautions / Restrictions Precautions Precautions: Fall;Knee Required Braces or Orthoses: (KI in room but no orders in chart) Restrictions Weight Bearing Restrictions: Yes RLE Weight Bearing: Weight bearing as tolerated    Mobility  Bed Mobility Overal bed mobility: Needs Assistance Bed Mobility: Supine to Sit     Supine to sit: Supervision;HOB elevated     General bed mobility comments: Supervision for safety with HOB elevated  Transfers Overall transfer level: Needs assistance Equipment used: Rolling walker (2 wheeled) Transfers: Sit to/from Stand Sit to Stand: Min guard         General transfer comment: Cues for proper hand placement.   Ambulation/Gait Ambulation/Gait assistance: Min guard Gait Distance (Feet): 160 Feet Assistive device: Rolling walker (2 wheeled) Gait Pattern/deviations: Step-to pattern;Decreased step length - right;Decreased step length - left;Decreased stance time - right;Decreased dorsiflexion - right Gait velocity: decreased    General Gait Details:  Continued R foot drop with compensatory R hip F and knee F.  Pt with improved posture and forward gaze.     Stairs             Wheelchair Mobility    Modified Rankin (Stroke Patients Only)       Balance Overall balance assessment: Needs assistance Sitting-balance support: Feet supported;No upper extremity supported Sitting balance-Leahy Scale: Good     Standing balance support: Bilateral upper extremity supported;During functional activity Standing balance-Leahy Scale: Poor Standing balance comment: Pt requires use of BUE to remain steady. PT min guard for safety,                            Cognition Arousal/Alertness: Awake/alert Behavior During Therapy: WFL for tasks assessed/performed Overall Cognitive Status: Within Functional Limits for tasks assessed                                        Exercises Total Joint Exercises Long Arc Quad: Strengthening;Right;Seated;15 reps Knee Flexion: AAROM;Right;5 reps;Seated Goniometric ROM: 84 deg R knee F    General Comments General comments (skin integrity, edema, etc.): Pt with new numbness/tingling R lateral distal LE and plantar surface of R foot.        Pertinent Vitals/Pain Pain Assessment: Faces Faces Pain Scale: Hurts even more Pain Location: R knee  Pain Descriptors / Indicators: Aching;Discomfort;Grimacing;Guarding;Sore Pain Intervention(s): Limited activity within patient's tolerance;Monitored during session;Repositioned;Ice applied;Utilized relaxation techniques    Home Living  Prior Function            PT Goals (current goals can now be found in the care plan section) Acute Rehab PT Goals Patient Stated Goal: To decrease pain and return home  PT Goal Formulation: With patient Time For Goal Achievement: 08/26/18 Potential to Achieve Goals: Good Progress towards PT goals: Progressing toward goals    Frequency    BID      PT Plan  Current plan remains appropriate    Co-evaluation              AM-PAC PT "6 Clicks" Mobility   Outcome Measure  Help needed turning from your back to your side while in a flat bed without using bedrails?: A Little Help needed moving from lying on your back to sitting on the side of a flat bed without using bedrails?: A Little Help needed moving to and from a bed to a chair (including a wheelchair)?: A Little Help needed standing up from a chair using your arms (e.g., wheelchair or bedside chair)?: A Little Help needed to walk in hospital room?: A Little Help needed climbing 3-5 steps with a railing? : A Little 6 Click Score: 18    End of Session Equipment Utilized During Treatment: Gait belt Activity Tolerance: Patient tolerated treatment well Patient left: with call bell/phone within reach;with SCD's reapplied;in chair;with chair alarm set(Polar care, towel roll ) Nurse Communication: Mobility status;Other (comment)(new numbness RLE (per pt, MD already aware)) PT Visit Diagnosis: Unsteadiness on feet (R26.81);Other abnormalities of gait and mobility (R26.89);Repeated falls (R29.6);Muscle weakness (generalized) (M62.81);History of falling (Z91.81);Pain Pain - Right/Left: Right Pain - part of body: Knee     Time: 0315-9458 PT Time Calculation (min) (ACUTE ONLY): 32 min  Charges:  $Gait Training: 8-22 mins $Therapeutic Exercise: 8-22 mins                        Collie Siad PT, DPT 08/13/2018, 3:24 PM

## 2018-08-14 DIAGNOSIS — Z23 Encounter for immunization: Secondary | ICD-10-CM | POA: Diagnosis not present

## 2018-08-14 LAB — BASIC METABOLIC PANEL
Anion gap: 9 (ref 5–15)
BUN: 21 mg/dL (ref 8–23)
CO2: 26 mmol/L (ref 22–32)
Calcium: 8.8 mg/dL — ABNORMAL LOW (ref 8.9–10.3)
Chloride: 103 mmol/L (ref 98–111)
Creatinine, Ser: 1.22 mg/dL — ABNORMAL HIGH (ref 0.44–1.00)
GFR calc Af Amer: 54 mL/min — ABNORMAL LOW (ref >60)
GFR calc non Af Amer: 46 mL/min — ABNORMAL LOW (ref >60)
Glucose, Bld: 147 mg/dL — ABNORMAL HIGH (ref 70–99)
Potassium: 3.9 mmol/L (ref 3.5–5.1)
Sodium: 138 mmol/L (ref 135–145)

## 2018-08-14 LAB — CBC
HEMATOCRIT: 38.7 % (ref 36.0–46.0)
HEMOGLOBIN: 13.1 g/dL (ref 12.0–15.0)
MCH: 32.1 pg (ref 26.0–34.0)
MCHC: 33.9 g/dL (ref 30.0–36.0)
MCV: 94.9 fL (ref 80.0–100.0)
Platelets: 276 10*3/uL (ref 150–400)
RBC: 4.08 MIL/uL (ref 3.87–5.11)
RDW: 13.4 % (ref 11.5–15.5)
WBC: 15.2 10*3/uL — ABNORMAL HIGH (ref 4.0–10.5)
nRBC: 0 % (ref 0.0–0.2)

## 2018-08-14 MED ORDER — ENOXAPARIN SODIUM 40 MG/0.4ML ~~LOC~~ SOLN: 40.0000 mg | SUBCUTANEOUS | 0 refills | Status: DC

## 2018-08-14 MED ORDER — TRAMADOL HCL 50 MG PO TABS: 50.0000 mg | ORAL_TABLET | Freq: Four times a day (QID) | ORAL | 0 refills | Status: DC | PRN

## 2018-08-14 MED ORDER — OXYCODONE HCL 5 MG PO TABS: 5.0000 mg | ORAL_TABLET | ORAL | 0 refills | Status: DC | PRN

## 2018-08-14 NOTE — Discharge Instructions (Signed)

## 2018-08-14 NOTE — Progress Notes (Signed)
Physical Therapy Treatment Patient Details Name: Krystal [name] MRN: [id number] DOB: 16-Jun-1951 Today's Date: 08/14/2018    History of Present Illness Pt is a 35 y F s/p R TKA.  Pt developed R foot drop and numbness/tingling in RLE on 1/22, MD aware. Pt's PMH includes osteoporosis, fibromyalgia, blindness R eye, breast cancer.    PT Comments    Bed mobility and ambulated around unit with walker and min guard/supervision.  No LOB or buckling noted and pt feels numbness/weakness in resolving in RLE.  Pt confident with mobility skills and is excited about going home this pm.  No further questions or concerns noted.   Follow Up Recommendations  Home health PT     Equipment Recommendations  Rolling walker with 5" wheels;3in1 (PT)    Recommendations for Other Services       Precautions / Restrictions Precautions Precautions: Fall;Knee Precaution Comments: Instructed pt in no pillow under knee and provided exercise handout Restrictions Weight Bearing Restrictions: Yes RLE Weight Bearing: Weight bearing as tolerated    Mobility  Bed Mobility Overal bed mobility: Modified Independent Bed Mobility: Supine to Sit     Supine to sit: Modified independent (Device/Increase time) Sit to supine: Modified independent (Device/Increase time)   General bed mobility comments: Supervision for safety with HOB elevated  Transfers Overall transfer level: Needs assistance Equipment used: Rolling walker (2 wheeled) Transfers: Sit to/from Stand Sit to Stand: Supervision            Ambulation/Gait Ambulation/Gait assistance: Min guard Gait Distance (Feet): 200 Feet Assistive device: Rolling walker (2 wheeled) Gait Pattern/deviations: Step-through pattern Gait velocity: decreased    General Gait Details: Pt with good dorsiflexion today guring gait.     Stairs Stairs: Yes Stairs assistance: Min guard Stair Management: One rail Right;Two rails;Step to pattern Number of Stairs:  4 General stair comments: stair training complete up/down with 2 steps B rails, 2 steps 1 rial right as at home.  Pt has full flight to bedroom but plans to sleep downstairs upon discharge.   Wheelchair Mobility    Modified Rankin (Stroke Patients Only)       Balance Overall balance assessment: Needs assistance Sitting-balance support: Feet supported;No upper extremity supported Sitting balance-Leahy Scale: Good     Standing balance support: Bilateral upper extremity supported;During functional activity Standing balance-Leahy Scale: Fair Standing balance comment: Pt requires use of BUE to remain steady. PT min guard for safety,                            Cognition Arousal/Alertness: Awake/alert Behavior During Therapy: WFL for tasks assessed/performed Overall Cognitive Status: Within Functional Limits for tasks assessed                                        Exercises Total Joint Exercises Ankle Circles/Pumps: AROM;Both;10 reps;Supine Quad Sets: Both;Supine;AROM;10 reps Heel Slides: AROM;Right;Supine;Seated;10 reps Straight Leg Raises: Supine;AROM;10 reps;Seated;Right Long Arc Quad: AROM;Seated;10 reps;Right Knee Flexion: AROM;10 reps;Seated;Right Goniometric ROM: 3-100    General Comments        Pertinent Vitals/Pain Pain Assessment: 0-10 Pain Score: 6  Pain Location: R knee  Pain Descriptors / Indicators: Aching;Discomfort;Grimacing;Guarding;Sore Pain Intervention(s): Premedicated before session;Ice applied    Home Living                      Prior Function  PT Goals (current goals can now be found in the care plan section) Progress towards PT goals: Progressing toward goals    Frequency    BID      PT Plan Current plan remains appropriate    Co-evaluation              AM-PAC PT "6 Clicks" Mobility   Outcome Measure  Help needed turning from your back to your side while in a flat bed  without using bedrails?: None Help needed moving from lying on your back to sitting on the side of a flat bed without using bedrails?: A Little Help needed moving to and from a bed to a chair (including a wheelchair)?: A Little Help needed standing up from a chair using your arms (e.g., wheelchair or bedside chair)?: A Little Help needed to walk in hospital room?: A Little Help needed climbing 3-5 steps with a railing? : A Little 6 Click Score: 19    End of Session Equipment Utilized During Treatment: Gait belt Activity Tolerance: Patient tolerated treatment well Patient left: in bed;with bed alarm set;with call bell/phone within reach Nurse Communication: Mobility status Pain - Right/Left: Right Pain - part of body: Knee     Time: 0569-7948 PT Time Calculation (min) (ACUTE ONLY): 13 min  Charges:  $Gait Training: 8-22 mins $Therapeutic Exercise: 8-22 mins                     Chesley Noon, PTA 08/14/18, 11:36 AM

## 2018-08-14 NOTE — Anesthesia Postprocedure Evaluation (Signed)
Anesthesia Post Note  Patient: Krystal [name]  Procedure(s) Performed: TOTAL KNEE ARTHROPLASTY-RIGHT (Right Knee) KNEE INJECTION-LEFT (Left Knee)  Patient location during evaluation: PACU Anesthesia Type: Spinal Level of consciousness: awake and alert Pain management: pain level controlled Vital Signs Assessment: post-procedure vital signs reviewed and stable Respiratory status: spontaneous breathing, nonlabored ventilation, respiratory function stable and patient connected to nasal cannula oxygen Cardiovascular status: blood pressure returned to baseline and stable Postop Assessment: no apparent nausea or vomiting Anesthetic complications: no     Last Vitals:  Vitals:   08/14/18 0821 08/14/18 1347  BP: (!) 136/97 (!) 144/79  Pulse: 69 62  Resp: 18 20  Temp: 37.4 C 36.9 C  SpO2: 99% 99%    Last Pain:  Vitals:   08/14/18 1347  TempSrc: Oral  PainSc: 5                  Molli Barrows

## 2018-08-14 NOTE — Progress Notes (Signed)
  Subjective: 2 Days Post-Op Procedure(s) (LRB): TOTAL KNEE ARTHROPLASTY-RIGHT (Right) KNEE INJECTION-LEFT (Left) Patient reports pain as mild.   Patient is well, and has had no acute complaints or problems  Footdrop which was present yesterday is much improved today. Plan for discharge home this afternoon following session with PT. Negative for chest pain and shortness of breath Fever: no Gastrointestinal:Negative for nausea and vomiting  Objective: Vital signs in last 24 hours: Temp:  [97.6 F (36.4 C)-99.3 F (37.4 C)] 99.3 F (37.4 C) (01/23 0821) Pulse Rate:  [63-69] 69 (01/23 0821) Resp:  [18] 18 (01/23 0821) BP: (120-156)/(71-97) 136/97 (01/23 0821) SpO2:  [95 %-99 %] 99 % (01/23 0821)  Intake/Output from previous day:  Intake/Output Summary (Last 24 hours) at 08/14/2018 1015 Last data filed at 08/14/2018 0839 Gross per 24 hour  Intake 590.67 ml  Output 0 ml  Net 590.67 ml    Intake/Output this shift: Total I/O In: 240 [P.O.:240] Out: -   Labs: Recent Labs    08/13/18 0522 08/14/18 0313  HGB 13.7 13.1   Recent Labs    08/13/18 0522 08/14/18 0313  WBC 16.5* 15.2*  RBC 4.29 4.08  HCT 39.8 38.7  PLT 285 276   Recent Labs    08/13/18 0522 08/14/18 0313  NA 135 138  K 3.8 3.9  CL 101 103  CO2 25 26  BUN 15 21  CREATININE 1.11* 1.22*  GLUCOSE 166* 147*  CALCIUM 8.7* 8.8*   No results for input(s): LABPT, INR in the last 72 hours.   EXAM General - Patient is Alert, Appropriate and Oriented Extremity - ABD soft Sensation intact distally Intact pulses distally Dorsiflexion/Plantar flexion intact Incision: dressing C/D/I No cellulitis present Dressing/Incision - Fresh honeycomb dressing applied, honeycomb dressing applied yesterday with dried blood. Motor Function - intact, moving foot and toes well on exam. Footdrop which was present yesterday is much improved today.  4/5 strength with dorsiflexion of the right foot. Abdomen soft this AM,  normal BS.  Past Medical History:  Diagnosis Date  . Anemia   . Arthritis    osteo  . Blindness of right eye at birth  . Cancer ([place])    Left breast  . Cataract   . Depression   . Edema 07/27/2011  . Fibromyalgia    muscle weakness and pain  . GERD (gastroesophageal reflux disease)   . Hyperlipidemia   . Hypertension    benign  . Osteoporosis   . Plantar fasciitis   . Pre-diabetes   . Restless leg syndrome   . Rosacea   . Rotator cuff rupture   . Sleep apnea   . Synovitis   . Ulcer     Assessment/Plan: 2 Days Post-Op Procedure(s) (LRB): TOTAL KNEE ARTHROPLASTY-RIGHT (Right) KNEE INJECTION-LEFT (Left) Active Problems:   Status post total knee replacement using cement, right   Rotator cuff rupture  Estimated body mass index is 33.19 kg/m as calculated from the following:   Height as of this encounter: 5\' 3"  (1.6 m).   Weight as of this encounter: 85 kg. Advance diet Up with therapy D/C IV fluids when tolerating po intake.  Labs reviewed this AM, WBC 15.2, improved compared to yesterday. Patient has had a BM. Footdrop from yesterday much improved. Plan for discharge home today following sessions with PT.  DVT Prophylaxis - Lovenox, Foot Pumps and TED hose Weight-Bearing as tolerated to right leg  J. Cameron Proud, PA-C [facility] 08/14/2018, 10:15 AM

## 2018-08-14 NOTE — Progress Notes (Signed)
Pt ready for discharge home today per MD. Patient assessment unchanged from this morning. Pt received lovenox teaching. Reviewed discharge instructions and prescriptions with pt and husband; all questions answered and pt verbalized understanding. PIV removed, VSS. Pt assisted to car via NT.   Ethelda Chick

## 2018-08-14 NOTE — Progress Notes (Signed)
Physical Therapy Treatment Patient Details Name: Krystal [name] MRN: [id number] DOB: 14-Aug-1950 Today's Date: 08/14/2018    History of Present Illness Pt is a 55 y F s/p R TKA.  Pt developed R foot drop and numbness/tingling in RLE on 1/22, MD aware. Pt's PMH includes osteoporosis, fibromyalgia, blindness R eye, breast cancer.    PT Comments    Pt ready for session today.  Excited to go home.  Participated in exercises as described below.  Bed mobility with rail but without assist.  Stood and was able to ambulate to/from PT gym for stair training.  Able to navigate well with +1 min guard for safety.  Will plan to ambulate nursing unit later this am prior to discharge.     Follow Up Recommendations  Home health PT     Equipment Recommendations  Rolling walker with 5" wheels;3in1 (PT)    Recommendations for Other Services       Precautions / Restrictions Precautions Precautions: Fall;Knee Precaution Comments: Instructed pt in no pillow under knee and provided exercise handout Restrictions Weight Bearing Restrictions: Yes RLE Weight Bearing: Weight bearing as tolerated    Mobility  Bed Mobility Overal bed mobility: Modified Independent Bed Mobility: Supine to Sit     Supine to sit: Modified independent (Device/Increase time)     General bed mobility comments: Supervision for safety with HOB elevated  Transfers   Equipment used: Rolling walker (2 wheeled) Transfers: Sit to/from Stand Sit to Stand: Supervision            Ambulation/Gait Ambulation/Gait assistance: Min guard Gait Distance (Feet): 100 Feet Assistive device: Rolling walker (2 wheeled) Gait Pattern/deviations: Step-through pattern Gait velocity: decreased    General Gait Details: Pt with good dorsiflexion today guring gait.     Stairs Stairs: Yes Stairs assistance: Min guard Stair Management: One rail Right;Two rails;Step to pattern Number of Stairs: 4 General stair comments: stair training  complete up/down with 2 steps B rails, 2 steps 1 rial right as at home.  Pt has full flight to bedroom but plans to sleep downstairs upon discharge.   Wheelchair Mobility    Modified Rankin (Stroke Patients Only)       Balance Overall balance assessment: Needs assistance Sitting-balance support: Feet supported;No upper extremity supported Sitting balance-Leahy Scale: Good     Standing balance support: Bilateral upper extremity supported;During functional activity Standing balance-Leahy Scale: Fair                              Cognition Arousal/Alertness: Awake/alert Behavior During Therapy: WFL for tasks assessed/performed Overall Cognitive Status: Within Functional Limits for tasks assessed                                        Exercises Total Joint Exercises Ankle Circles/Pumps: AROM;Both;10 reps;Supine Quad Sets: Both;Supine;AROM;10 reps Heel Slides: AROM;Right;Supine;Seated;10 reps Straight Leg Raises: Supine;AROM;10 reps;Seated;Right Long Arc Quad: AROM;Seated;10 reps;Right Knee Flexion: AROM;10 reps;Seated;Right Goniometric ROM: 3-100    General Comments        Pertinent Vitals/Pain Pain Assessment: 0-10 Pain Score: 8  Pain Location: R knee  Pain Descriptors / Indicators: Aching;Discomfort;Grimacing;Guarding;Sore Pain Intervention(s): Limited activity within patient's tolerance;Repositioned;Premedicated before session;Ice applied    Home Living                      Prior Function  PT Goals (current goals can now be found in the care plan section) Progress towards PT goals: Progressing toward goals    Frequency    BID      PT Plan Current plan remains appropriate    Co-evaluation              AM-PAC PT "6 Clicks" Mobility   Outcome Measure  Help needed turning from your back to your side while in a flat bed without using bedrails?: None Help needed moving from lying on your back to  sitting on the side of a flat bed without using bedrails?: A Little Help needed moving to and from a bed to a chair (including a wheelchair)?: A Little Help needed standing up from a chair using your arms (e.g., wheelchair or bedside chair)?: A Little Help needed to walk in hospital room?: A Little Help needed climbing 3-5 steps with a railing? : A Little 6 Click Score: 19    End of Session Equipment Utilized During Treatment: Gait belt Activity Tolerance: Patient tolerated treatment well Patient left: in chair;with call bell/phone within reach;with chair alarm set Nurse Communication: Mobility status Pain - Right/Left: Right Pain - part of body: Knee     Time: 7948-0165 PT Time Calculation (min) (ACUTE ONLY): 23 min  Charges:  $Gait Training: 8-22 mins $Therapeutic Exercise: 8-22 mins                     Chesley Noon, PTA 08/14/18, 9:43 AM

## 2018-08-14 NOTE — Discharge Summary (Signed)
Physician Discharge Summary  Patient ID: Krystal [name] MRN: [id number] DOB/AGE: June 29, 1951 68 y.o.  Admit date: 08/12/2018 Discharge date: 08/14/2018  Admission Diagnoses:  PRIMARY OSTEOARTHRITIS OF RIGHT KNEE, PRIMARY OSTEOARTHRITIS OF LEFT KNEE  Discharge Diagnoses: Patient Active Problem List   Diagnosis Date Noted  . Status post total knee replacement using cement, right 08/12/2018  . Rotator cuff rupture   . Acquired absence of bilateral breasts and nipples 09/17/2012  . Edema 07/27/2011  . Breast cancer of lower-outer quadrant of left female breast ([place]) 01/16/2011    Past Medical History:  Diagnosis Date  . Anemia   . Arthritis    osteo  . Blindness of right eye at birth  . Cancer ([place])    Left breast  . Cataract   . Depression   . Edema 07/27/2011  . Fibromyalgia    muscle weakness and pain  . GERD (gastroesophageal reflux disease)   . Hyperlipidemia   . Hypertension    benign  . Osteoporosis   . Plantar fasciitis   . Pre-diabetes   . Restless leg syndrome   . Rosacea   . Rotator cuff rupture   . Sleep apnea   . Synovitis   . Ulcer      Transfusion: None.   Consultants (if any):   Discharged Condition: Improved  Hospital Course: Krystal [name] is an 68 y.o. female who was admitted 08/12/2018 with a diagnosis of primary osteoarthritis of the right knee and went to the operating room on 08/12/2018 and underwent the above named procedures.    Surgeries: Procedure(s): TOTAL KNEE ARTHROPLASTY-RIGHT KNEE INJECTION-LEFT on 08/12/2018 Patient tolerated the surgery well. Taken to PACU where she was stabilized and then transferred to the orthopedic floor.  Started on Lovenox 40mg  q 24 hrs. Foot pumps applied bilaterally at 80 mm. Heels elevated on bed with rolled towels. No evidence of DVT. Negative [place]. Physical therapy started on day #1 for gait training and transfer. OT started day #1 for ADL and assisted devices.  On POD1 patient developed acute  Footdrop to the right leg, ACE wrap was removed to decrease pressure from the peroneal nerve, footdrop resolved on POD2.  Patient's IV , foley and hemovac was d/c on day #2.  Implants: Right TKA using all-cemented [employer] Vanguard system with a 62.5 mm PCR femur, a 67 mm tibial tray with a 14 mm anterior stabilized E-poly insert, and a 31 x 8 mm all-poly 3-pegged domed patella.  She was given perioperative antibiotics:  Anti-infectives (From admission, onward)   Start     Dose/Rate Route Frequency Ordered Stop   08/12/18 1900  vancomycin (VANCOCIN) IVPB 1000 mg/200 mL premix     1,000 mg 200 mL/hr over 60 Minutes Intravenous Every 12 hours 08/12/18 1124 08/12/18 1842   08/12/18 0842  bacitracin 50,000 Units in sodium chloride 0.9 % 500 mL irrigation  Status:  Discontinued       As needed 08/12/18 0843 08/12/18 1002   08/11/18 2200  vancomycin (VANCOCIN) IVPB 1000 mg/200 mL premix     1,000 mg 200 mL/hr over 60 Minutes Intravenous  Once 08/11/18 2145 08/12/18 0745    .  She was given sequential compression devices, early ambulation, and Lovenox for DVT prophylaxis.  She benefited maximally from the hospital stay and there were no complications.    Recent vital signs:  Vitals:   08/13/18 2323 08/14/18 0821  BP: (!) 156/79 (!) 136/97  Pulse: 65 69  Resp: 18 18  Temp: 98.2  F (36.8 C) 99.3 F (37.4 C)  SpO2: 95% 99%    Recent laboratory studies:  Lab Results  Component Value Date   HGB 13.1 08/14/2018   HGB 13.7 08/13/2018   HGB 14.8 08/06/2018   Lab Results  Component Value Date   WBC 15.2 (H) 08/14/2018   PLT 276 08/14/2018   Lab Results  Component Value Date   INR 0.97 08/06/2018   Lab Results  Component Value Date   NA 138 08/14/2018   K 3.9 08/14/2018   CL 103 08/14/2018   CO2 26 08/14/2018   BUN 21 08/14/2018   CREATININE 1.22 (H) 08/14/2018   GLUCOSE 147 (H) 08/14/2018    Discharge Medications:   Allergies as of 08/14/2018      Reactions   Cephalexin  Nausea Only   Diaphoresis / Sweating (intolerance)   Levaquin [levofloxacin] Other (See Comments)   Stiff joints, unable to walk.      Medication List    STOP taking these medications   aspirin 81 MG tablet   meloxicam 7.5 MG tablet Commonly known as:  MOBIC     TAKE these medications   alendronate 70 MG tablet Commonly known as:  FOSAMAX Take 70 mg by mouth every Saturday. Take with a full glass of water on an empty stomach.   amLODipine 10 MG tablet Commonly known as:  NORVASC Take 10 mg by mouth daily.   atenolol 100 MG tablet Commonly known as:  TENORMIN Take 100 mg by mouth daily.   CALCIUM + D PO Take 1 tablet by mouth 2 (two) times daily.   cetirizine 10 MG tablet Commonly known as:  ZYRTEC Take 10 mg by mouth daily as needed for allergies.   cholecalciferol 25 MCG (1000 UT) tablet Commonly known as:  VITAMIN D3 Take 5,000 Units by mouth every evening.   diphenhydrAMINE 25 MG tablet Commonly known as:  BENADRYL Take 25 mg by mouth at bedtime as needed.   enoxaparin 40 MG/0.4ML injection Commonly known as:  LOVENOX Inject 0.4 mLs (40 mg total) into the skin daily. Start taking on:  August 15, 2018   fluticasone 50 MCG/ACT nasal spray Commonly known as:  FLONASE Place 2 sprays into both nostrils 2 (two) times daily as needed for allergies.   furosemide 20 MG tablet Commonly known as:  LASIX Take 20 mg by mouth 2 (two) times daily.   GLUCOSAMINE-CHONDROITIN PLUS PO Take 1 tablet by mouth every evening.   lisinopril-hydrochlorothiazide 20-25 MG tablet Commonly known as:  PRINZIDE,ZESTORETIC Take 1 tablet by mouth every other day.   LUBRICANT EYE DROPS OP Place 1 drop into both eyes daily as needed (dry eyes).   LYSINE ACETATE PO Take 4,000 mg by mouth daily as needed (cold sores).   magnesium oxide 400 MG tablet Commonly known as:  MAG-OX Take 400 mg by mouth daily as needed (leg cramps).   methocarbamol 750 MG tablet Commonly known as:   ROBAXIN Take 750 mg by mouth 2 (two) times daily as needed for muscle spasms.   oxyCODONE 5 MG immediate release tablet Commonly known as:  Oxy IR/ROXICODONE Take 1-2 tablets (5-10 mg total) by mouth every 4 (four) hours as needed for moderate pain (pain score 4-6).   potassium chloride 10 MEQ tablet Commonly known as:  K-DUR,KLOR-CON Take 10 mEq by mouth 2 (two) times daily.   pregabalin 300 MG capsule Commonly known as:  LYRICA Take 300 mg by mouth 2 (two) times daily.   ranitidine  150 MG tablet Commonly known as:  ZANTAC Take 150 mg by mouth 2 (two) times daily.   rOPINIRole 1 MG tablet Commonly known as:  REQUIP Take 1 mg by mouth at bedtime.   tamoxifen 10 MG tablet Commonly known as:  NOLVADEX TAKE 1 TABLET EVERY DAY   traMADol 50 MG tablet Commonly known as:  ULTRAM Take 1-2 tablets (50-100 mg total) by mouth every 6 (six) hours as needed for moderate pain.   triamcinolone 0.025 % cream Commonly known as:  KENALOG Apply 1 application topically daily as needed (rash).   zolpidem 10 MG tablet Commonly known as:  AMBIEN Take 10 mg by mouth at bedtime.            Durable Medical Equipment  (From admission, onward)         Start     Ordered   08/14/18 0725  For home use only DME Other see comment  Once    Comments:  Bedside Toilet.   08/14/18 0724   08/12/18 1124  DME 3 n 1  Once     08/12/18 1124   08/12/18 1124  DME Bedside commode  Once    Question:  Patient needs a bedside commode to treat with the following condition  Answer:  Status post total knee replacement using cement, right   08/12/18 1124   08/12/18 1124  DME Walker rolling  Once    Question:  Patient needs a walker to treat with the following condition  Answer:  Status post total knee replacement using cement, right   08/12/18 1124          Diagnostic Studies: Dg Chest 2 View  Result Date: 08/06/2018 CLINICAL DATA:  Preop knee surgery.  Hypertension. EXAM: CHEST - 2 VIEW COMPARISON:   04/01/2018 FINDINGS: Heart and mediastinal contours are within normal limits. No focal opacities or effusions. No acute bony abnormality. IMPRESSION: No active cardiopulmonary disease. Electronically Signed   By: Rolm Baptise M.D.   On: 08/06/2018 09:37   Dg Knee Right Port  Result Date: 08/12/2018 CLINICAL DATA:  Total knee replacement EXAM: PORTABLE RIGHT KNEE - 1-2 VIEW COMPARISON:  None similar FINDINGS: Total knee arthroplasty with well-seated prosthesis. No periprosthetic fracture. Expected soft tissue gas and swelling. IMPRESSION: No unexpected finding after total knee arthroplasty. Electronically Signed   By: Monte Fantasia M.D.   On: 08/12/2018 10:45    Disposition: Plan for discharge home today following sessions with PT.    Follow-up Information    Lattie Corns, PA-C Follow up in 14 day(s).   Specialty:  Physician Assistant Why:  Electa Sniff information: [place] [address] [phone]          Signed: Judson Roch PA-C 08/14/2018, 10:20 AM

## 2018-08-16 DIAGNOSIS — I10 Essential (primary) hypertension: Secondary | ICD-10-CM | POA: Diagnosis not present

## 2018-08-16 DIAGNOSIS — H5461 Unqualified visual loss, right eye, normal vision left eye: Secondary | ICD-10-CM | POA: Diagnosis not present

## 2018-08-16 DIAGNOSIS — M797 Fibromyalgia: Secondary | ICD-10-CM | POA: Diagnosis not present

## 2018-08-16 DIAGNOSIS — M1712 Unilateral primary osteoarthritis, left knee: Secondary | ICD-10-CM | POA: Diagnosis not present

## 2018-08-16 DIAGNOSIS — M810 Age-related osteoporosis without current pathological fracture: Secondary | ICD-10-CM | POA: Diagnosis not present

## 2018-08-16 DIAGNOSIS — G2581 Restless legs syndrome: Secondary | ICD-10-CM | POA: Diagnosis not present

## 2018-08-16 DIAGNOSIS — F329 Major depressive disorder, single episode, unspecified: Secondary | ICD-10-CM | POA: Diagnosis not present

## 2018-08-16 DIAGNOSIS — Z471 Aftercare following joint replacement surgery: Secondary | ICD-10-CM | POA: Diagnosis not present

## 2018-08-16 DIAGNOSIS — H269 Unspecified cataract: Secondary | ICD-10-CM | POA: Diagnosis not present

## 2018-08-19 DIAGNOSIS — I10 Essential (primary) hypertension: Secondary | ICD-10-CM | POA: Diagnosis not present

## 2018-08-19 DIAGNOSIS — Z471 Aftercare following joint replacement surgery: Secondary | ICD-10-CM | POA: Diagnosis not present

## 2018-08-19 DIAGNOSIS — M810 Age-related osteoporosis without current pathological fracture: Secondary | ICD-10-CM | POA: Diagnosis not present

## 2018-08-19 DIAGNOSIS — M1712 Unilateral primary osteoarthritis, left knee: Secondary | ICD-10-CM | POA: Diagnosis not present

## 2018-08-19 DIAGNOSIS — H5461 Unqualified visual loss, right eye, normal vision left eye: Secondary | ICD-10-CM | POA: Diagnosis not present

## 2018-08-19 DIAGNOSIS — M797 Fibromyalgia: Secondary | ICD-10-CM | POA: Diagnosis not present

## 2018-08-19 DIAGNOSIS — G2581 Restless legs syndrome: Secondary | ICD-10-CM | POA: Diagnosis not present

## 2018-08-19 DIAGNOSIS — H269 Unspecified cataract: Secondary | ICD-10-CM | POA: Diagnosis not present

## 2018-08-19 DIAGNOSIS — F329 Major depressive disorder, single episode, unspecified: Secondary | ICD-10-CM | POA: Diagnosis not present

## 2018-08-20 DIAGNOSIS — G2581 Restless legs syndrome: Secondary | ICD-10-CM | POA: Diagnosis not present

## 2018-08-20 DIAGNOSIS — F329 Major depressive disorder, single episode, unspecified: Secondary | ICD-10-CM | POA: Diagnosis not present

## 2018-08-20 DIAGNOSIS — I10 Essential (primary) hypertension: Secondary | ICD-10-CM | POA: Diagnosis not present

## 2018-08-20 DIAGNOSIS — H5461 Unqualified visual loss, right eye, normal vision left eye: Secondary | ICD-10-CM | POA: Diagnosis not present

## 2018-08-20 DIAGNOSIS — M1712 Unilateral primary osteoarthritis, left knee: Secondary | ICD-10-CM | POA: Diagnosis not present

## 2018-08-20 DIAGNOSIS — M797 Fibromyalgia: Secondary | ICD-10-CM | POA: Diagnosis not present

## 2018-08-20 DIAGNOSIS — M810 Age-related osteoporosis without current pathological fracture: Secondary | ICD-10-CM | POA: Diagnosis not present

## 2018-08-20 DIAGNOSIS — H269 Unspecified cataract: Secondary | ICD-10-CM | POA: Diagnosis not present

## 2018-08-20 DIAGNOSIS — Z471 Aftercare following joint replacement surgery: Secondary | ICD-10-CM | POA: Diagnosis not present

## 2018-08-22 DIAGNOSIS — M1712 Unilateral primary osteoarthritis, left knee: Secondary | ICD-10-CM | POA: Diagnosis not present

## 2018-08-22 DIAGNOSIS — I10 Essential (primary) hypertension: Secondary | ICD-10-CM | POA: Diagnosis not present

## 2018-08-22 DIAGNOSIS — M797 Fibromyalgia: Secondary | ICD-10-CM | POA: Diagnosis not present

## 2018-08-22 DIAGNOSIS — Z471 Aftercare following joint replacement surgery: Secondary | ICD-10-CM | POA: Diagnosis not present

## 2018-08-22 DIAGNOSIS — F329 Major depressive disorder, single episode, unspecified: Secondary | ICD-10-CM | POA: Diagnosis not present

## 2018-08-22 DIAGNOSIS — M810 Age-related osteoporosis without current pathological fracture: Secondary | ICD-10-CM | POA: Diagnosis not present

## 2018-08-22 DIAGNOSIS — H5461 Unqualified visual loss, right eye, normal vision left eye: Secondary | ICD-10-CM | POA: Diagnosis not present

## 2018-08-22 DIAGNOSIS — H269 Unspecified cataract: Secondary | ICD-10-CM | POA: Diagnosis not present

## 2018-08-22 DIAGNOSIS — G2581 Restless legs syndrome: Secondary | ICD-10-CM | POA: Diagnosis not present

## 2018-08-25 DIAGNOSIS — M810 Age-related osteoporosis without current pathological fracture: Secondary | ICD-10-CM | POA: Diagnosis not present

## 2018-08-25 DIAGNOSIS — H5461 Unqualified visual loss, right eye, normal vision left eye: Secondary | ICD-10-CM | POA: Diagnosis not present

## 2018-08-25 DIAGNOSIS — Z471 Aftercare following joint replacement surgery: Secondary | ICD-10-CM | POA: Diagnosis not present

## 2018-08-25 DIAGNOSIS — H269 Unspecified cataract: Secondary | ICD-10-CM | POA: Diagnosis not present

## 2018-08-25 DIAGNOSIS — G2581 Restless legs syndrome: Secondary | ICD-10-CM | POA: Diagnosis not present

## 2018-08-25 DIAGNOSIS — M1712 Unilateral primary osteoarthritis, left knee: Secondary | ICD-10-CM | POA: Diagnosis not present

## 2018-08-25 DIAGNOSIS — I10 Essential (primary) hypertension: Secondary | ICD-10-CM | POA: Diagnosis not present

## 2018-08-25 DIAGNOSIS — M797 Fibromyalgia: Secondary | ICD-10-CM | POA: Diagnosis not present

## 2018-08-25 DIAGNOSIS — F329 Major depressive disorder, single episode, unspecified: Secondary | ICD-10-CM | POA: Diagnosis not present

## 2018-08-27 DIAGNOSIS — M6281 Muscle weakness (generalized): Secondary | ICD-10-CM | POA: Diagnosis not present

## 2018-08-27 DIAGNOSIS — Z96651 Presence of right artificial knee joint: Secondary | ICD-10-CM | POA: Diagnosis not present

## 2018-08-27 DIAGNOSIS — M25661 Stiffness of right knee, not elsewhere classified: Secondary | ICD-10-CM | POA: Diagnosis not present

## 2018-08-27 DIAGNOSIS — M25561 Pain in right knee: Secondary | ICD-10-CM | POA: Diagnosis not present

## 2018-08-29 DIAGNOSIS — Z96651 Presence of right artificial knee joint: Secondary | ICD-10-CM | POA: Diagnosis not present

## 2018-08-29 DIAGNOSIS — M25561 Pain in right knee: Secondary | ICD-10-CM | POA: Diagnosis not present

## 2018-09-01 DIAGNOSIS — Z96651 Presence of right artificial knee joint: Secondary | ICD-10-CM | POA: Diagnosis not present

## 2018-09-01 DIAGNOSIS — M25561 Pain in right knee: Secondary | ICD-10-CM | POA: Diagnosis not present

## 2018-09-03 DIAGNOSIS — M25561 Pain in right knee: Secondary | ICD-10-CM | POA: Diagnosis not present

## 2018-09-03 DIAGNOSIS — Z96651 Presence of right artificial knee joint: Secondary | ICD-10-CM | POA: Diagnosis not present

## 2018-09-04 DIAGNOSIS — B353 Tinea pedis: Secondary | ICD-10-CM | POA: Diagnosis not present

## 2018-09-04 DIAGNOSIS — M25561 Pain in right knee: Secondary | ICD-10-CM | POA: Diagnosis not present

## 2018-09-05 DIAGNOSIS — Z96651 Presence of right artificial knee joint: Secondary | ICD-10-CM | POA: Diagnosis not present

## 2018-09-08 DIAGNOSIS — Z96651 Presence of right artificial knee joint: Secondary | ICD-10-CM | POA: Diagnosis not present

## 2018-09-08 DIAGNOSIS — M25561 Pain in right knee: Secondary | ICD-10-CM | POA: Diagnosis not present

## 2018-09-10 DIAGNOSIS — Z96651 Presence of right artificial knee joint: Secondary | ICD-10-CM | POA: Diagnosis not present

## 2018-09-12 DIAGNOSIS — Z96651 Presence of right artificial knee joint: Secondary | ICD-10-CM | POA: Diagnosis not present

## 2018-09-12 DIAGNOSIS — M25561 Pain in right knee: Secondary | ICD-10-CM | POA: Diagnosis not present

## 2018-09-15 DIAGNOSIS — Z96651 Presence of right artificial knee joint: Secondary | ICD-10-CM | POA: Diagnosis not present

## 2018-09-17 DIAGNOSIS — Z96651 Presence of right artificial knee joint: Secondary | ICD-10-CM | POA: Diagnosis not present

## 2018-09-17 DIAGNOSIS — M25561 Pain in right knee: Secondary | ICD-10-CM | POA: Diagnosis not present

## 2018-09-22 DIAGNOSIS — Z96651 Presence of right artificial knee joint: Secondary | ICD-10-CM | POA: Diagnosis not present

## 2018-09-22 DIAGNOSIS — M25561 Pain in right knee: Secondary | ICD-10-CM | POA: Diagnosis not present

## 2018-09-29 DIAGNOSIS — M1711 Unilateral primary osteoarthritis, right knee: Secondary | ICD-10-CM | POA: Diagnosis not present

## 2018-09-30 DIAGNOSIS — Z471 Aftercare following joint replacement surgery: Secondary | ICD-10-CM | POA: Diagnosis not present

## 2018-11-04 DIAGNOSIS — R739 Hyperglycemia, unspecified: Secondary | ICD-10-CM | POA: Diagnosis not present

## 2018-11-11 DIAGNOSIS — M9902 Segmental and somatic dysfunction of thoracic region: Secondary | ICD-10-CM | POA: Diagnosis not present

## 2018-11-11 DIAGNOSIS — M5136 Other intervertebral disc degeneration, lumbar region: Secondary | ICD-10-CM | POA: Diagnosis not present

## 2018-11-11 DIAGNOSIS — M9903 Segmental and somatic dysfunction of lumbar region: Secondary | ICD-10-CM | POA: Diagnosis not present

## 2018-11-11 DIAGNOSIS — M5134 Other intervertebral disc degeneration, thoracic region: Secondary | ICD-10-CM | POA: Diagnosis not present

## 2018-11-13 DIAGNOSIS — M5136 Other intervertebral disc degeneration, lumbar region: Secondary | ICD-10-CM | POA: Diagnosis not present

## 2018-11-13 DIAGNOSIS — M9903 Segmental and somatic dysfunction of lumbar region: Secondary | ICD-10-CM | POA: Diagnosis not present

## 2018-11-13 DIAGNOSIS — M5134 Other intervertebral disc degeneration, thoracic region: Secondary | ICD-10-CM | POA: Diagnosis not present

## 2018-11-13 DIAGNOSIS — M9902 Segmental and somatic dysfunction of thoracic region: Secondary | ICD-10-CM | POA: Diagnosis not present

## 2018-11-17 DIAGNOSIS — Z96651 Presence of right artificial knee joint: Secondary | ICD-10-CM | POA: Diagnosis not present

## 2018-11-17 DIAGNOSIS — M5134 Other intervertebral disc degeneration, thoracic region: Secondary | ICD-10-CM | POA: Diagnosis not present

## 2018-11-17 DIAGNOSIS — M9902 Segmental and somatic dysfunction of thoracic region: Secondary | ICD-10-CM | POA: Diagnosis not present

## 2018-11-17 DIAGNOSIS — M9903 Segmental and somatic dysfunction of lumbar region: Secondary | ICD-10-CM | POA: Diagnosis not present

## 2018-11-17 DIAGNOSIS — M5136 Other intervertebral disc degeneration, lumbar region: Secondary | ICD-10-CM | POA: Diagnosis not present

## 2018-11-24 DIAGNOSIS — M9902 Segmental and somatic dysfunction of thoracic region: Secondary | ICD-10-CM | POA: Diagnosis not present

## 2018-11-24 DIAGNOSIS — M5134 Other intervertebral disc degeneration, thoracic region: Secondary | ICD-10-CM | POA: Diagnosis not present

## 2018-11-24 DIAGNOSIS — M5136 Other intervertebral disc degeneration, lumbar region: Secondary | ICD-10-CM | POA: Diagnosis not present

## 2018-11-24 DIAGNOSIS — M9903 Segmental and somatic dysfunction of lumbar region: Secondary | ICD-10-CM | POA: Diagnosis not present

## 2018-12-02 DIAGNOSIS — M9903 Segmental and somatic dysfunction of lumbar region: Secondary | ICD-10-CM | POA: Diagnosis not present

## 2018-12-02 DIAGNOSIS — M5136 Other intervertebral disc degeneration, lumbar region: Secondary | ICD-10-CM | POA: Diagnosis not present

## 2018-12-02 DIAGNOSIS — M5134 Other intervertebral disc degeneration, thoracic region: Secondary | ICD-10-CM | POA: Diagnosis not present

## 2018-12-02 DIAGNOSIS — M9902 Segmental and somatic dysfunction of thoracic region: Secondary | ICD-10-CM | POA: Diagnosis not present

## 2019-04-02 ENCOUNTER — Other Ambulatory Visit: Payer: Self-pay | Admitting: *Deleted

## 2019-04-02 DIAGNOSIS — C50512 Malignant neoplasm of lower-outer quadrant of left female breast: Secondary | ICD-10-CM

## 2019-04-03 ENCOUNTER — Other Ambulatory Visit: Payer: Self-pay

## 2019-04-03 ENCOUNTER — Encounter: Payer: Self-pay | Admitting: Adult Health

## 2019-04-03 ENCOUNTER — Inpatient Hospital Stay: Payer: Medicare HMO | Attending: Adult Health

## 2019-04-03 ENCOUNTER — Inpatient Hospital Stay (HOSPITAL_BASED_OUTPATIENT_CLINIC_OR_DEPARTMENT_OTHER): Payer: Medicare HMO | Admitting: Adult Health

## 2019-04-03 VITALS — BP 127/58 | HR 61 | Temp 98.3°F | Resp 17 | Ht 63.0 in | Wt 188.2 lb

## 2019-04-03 DIAGNOSIS — I10 Essential (primary) hypertension: Secondary | ICD-10-CM | POA: Insufficient documentation

## 2019-04-03 DIAGNOSIS — Z9221 Personal history of antineoplastic chemotherapy: Secondary | ICD-10-CM | POA: Diagnosis not present

## 2019-04-03 DIAGNOSIS — Z79810 Long term (current) use of selective estrogen receptor modulators (SERMs): Secondary | ICD-10-CM | POA: Insufficient documentation

## 2019-04-03 DIAGNOSIS — H5461 Unqualified visual loss, right eye, normal vision left eye: Secondary | ICD-10-CM | POA: Diagnosis not present

## 2019-04-03 DIAGNOSIS — G2581 Restless legs syndrome: Secondary | ICD-10-CM | POA: Insufficient documentation

## 2019-04-03 DIAGNOSIS — E785 Hyperlipidemia, unspecified: Secondary | ICD-10-CM | POA: Diagnosis not present

## 2019-04-03 DIAGNOSIS — Z801 Family history of malignant neoplasm of trachea, bronchus and lung: Secondary | ICD-10-CM | POA: Insufficient documentation

## 2019-04-03 DIAGNOSIS — M1990 Unspecified osteoarthritis, unspecified site: Secondary | ICD-10-CM | POA: Insufficient documentation

## 2019-04-03 DIAGNOSIS — Z803 Family history of malignant neoplasm of breast: Secondary | ICD-10-CM | POA: Insufficient documentation

## 2019-04-03 DIAGNOSIS — Z7901 Long term (current) use of anticoagulants: Secondary | ICD-10-CM | POA: Insufficient documentation

## 2019-04-03 DIAGNOSIS — G4730 Sleep apnea, unspecified: Secondary | ICD-10-CM | POA: Insufficient documentation

## 2019-04-03 DIAGNOSIS — Z9013 Acquired absence of bilateral breasts and nipples: Secondary | ICD-10-CM | POA: Diagnosis not present

## 2019-04-03 DIAGNOSIS — C50512 Malignant neoplasm of lower-outer quadrant of left female breast: Secondary | ICD-10-CM | POA: Diagnosis not present

## 2019-04-03 DIAGNOSIS — Z79899 Other long term (current) drug therapy: Secondary | ICD-10-CM | POA: Insufficient documentation

## 2019-04-03 DIAGNOSIS — K219 Gastro-esophageal reflux disease without esophagitis: Secondary | ICD-10-CM | POA: Insufficient documentation

## 2019-04-03 DIAGNOSIS — R232 Flushing: Secondary | ICD-10-CM | POA: Insufficient documentation

## 2019-04-03 DIAGNOSIS — R7989 Other specified abnormal findings of blood chemistry: Secondary | ICD-10-CM | POA: Insufficient documentation

## 2019-04-03 DIAGNOSIS — Z170 Estrogen receptor positive status [ER+]: Secondary | ICD-10-CM | POA: Insufficient documentation

## 2019-04-03 DIAGNOSIS — M797 Fibromyalgia: Secondary | ICD-10-CM | POA: Insufficient documentation

## 2019-04-03 LAB — CMP (CANCER CENTER ONLY)
ALT: 67 U/L — ABNORMAL HIGH (ref 0–44)
AST: 136 U/L — ABNORMAL HIGH (ref 15–41)
Albumin: 4.1 g/dL (ref 3.5–5.0)
Alkaline Phosphatase: 67 U/L (ref 38–126)
Anion gap: 8 (ref 5–15)
BUN: 10 mg/dL (ref 8–23)
CO2: 28 mmol/L (ref 22–32)
Calcium: 9 mg/dL (ref 8.9–10.3)
Chloride: 100 mmol/L (ref 98–111)
Creatinine: 1.14 mg/dL — ABNORMAL HIGH (ref 0.44–1.00)
GFR, Est AFR Am: 58 mL/min — ABNORMAL LOW (ref >60)
GFR, Estimated: 50 mL/min — ABNORMAL LOW (ref >60)
Glucose, Bld: 104 mg/dL — ABNORMAL HIGH (ref 70–99)
Potassium: 3.9 mmol/L (ref 3.5–5.1)
Sodium: 136 mmol/L (ref 135–145)
Total Bilirubin: 1 mg/dL (ref 0.3–1.2)
Total Protein: 6.9 g/dL (ref 6.5–8.1)

## 2019-04-03 LAB — CBC WITH DIFFERENTIAL (CANCER CENTER ONLY)
Abs Immature Granulocytes: 0.02 10*3/uL (ref 0.00–0.07)
Basophils Absolute: 0.1 10*3/uL (ref 0.0–0.1)
Basophils Relative: 1 %
Eosinophils Absolute: 0.6 10*3/uL — ABNORMAL HIGH (ref 0.0–0.5)
Eosinophils Relative: 9 %
HCT: 40.9 % (ref 36.0–46.0)
Hemoglobin: 14.2 g/dL (ref 12.0–15.0)
Immature Granulocytes: 0 %
Lymphocytes Relative: 18 %
Lymphs Abs: 1.1 10*3/uL (ref 0.7–4.0)
MCH: 34.1 pg — ABNORMAL HIGH (ref 26.0–34.0)
MCHC: 34.7 g/dL (ref 30.0–36.0)
MCV: 98.1 fL (ref 80.0–100.0)
Monocytes Absolute: 0.6 10*3/uL (ref 0.1–1.0)
Monocytes Relative: 9 %
Neutro Abs: 3.8 10*3/uL (ref 1.7–7.7)
Neutrophils Relative %: 63 %
Platelet Count: 209 10*3/uL (ref 150–400)
RBC: 4.17 MIL/uL (ref 3.87–5.11)
RDW: 13.5 % (ref 11.5–15.5)
WBC Count: 6.1 10*3/uL (ref 4.0–10.5)
nRBC: 0 % (ref 0.0–0.2)

## 2019-04-03 LAB — LIPID PANEL
Cholesterol: 197 mg/dL (ref 0–200)
HDL: 50 mg/dL (ref >40)
LDL Cholesterol: 124 mg/dL — ABNORMAL HIGH (ref 0–99)
Total CHOL/HDL Ratio: 3.9 RATIO
Triglycerides: 116 mg/dL (ref <150)
VLDL: 23 mg/dL (ref 0–40)

## 2019-04-03 NOTE — Progress Notes (Signed)
CLINIC:  Survivorship   REASON FOR VISIT:  Routine follow-up for history of breast cancer.   BRIEF ONCOLOGIC HISTORY:  Oncology History  Breast cancer of lower-outer quadrant of left female breast ([place])  12/28/2010 Surgery   Bilateral mastectomies: Left: no residual cancer, 1/15 LN positive; Right: benign, 0/3 LN   01/29/2011 - 07/02/2011 Chemotherapy   adjuvant chemotherapy following NSABP B. 49 clinical trial. She received 4 cycles of dose dense Adriamycin and Cytoxan followed by weekly single agent Taxol    07/31/2011 -  Anti-estrogen oral therapy   tamoxifen 10 mg daily daily. A total of 10 years therapy is planned based on BCI testing inability to be performed to to insufficient tissue. She has also been on Aromasin and Arimidex in the past, and was unable to tolerate these       INTERVAL HISTORY:  Krystal [name] presents to the [facility] today for routine follow-up for her history of breast cancer.  She continues on tamoxifen daily.  She continues to experience hot flashes due to this.  She is wanting to stop the Tamoxifen due to her issues with night sweats, moodiness, and hair thinning.  She has been on this for 7 years.        REVIEW OF SYSTEMS:  Review of Systems  Constitutional: Negative for appetite change, chills, fatigue, fever and unexpected weight change.  HENT:   Negative for hearing loss, lump/mass and trouble swallowing.   Eyes: Negative for eye problems and icterus.  Respiratory: Negative for chest tightness, cough, shortness of breath and wheezing.   Cardiovascular: Negative for chest pain, leg swelling and palpitations.  Gastrointestinal: Negative for abdominal distention, abdominal pain, constipation, diarrhea, nausea and vomiting.  Endocrine: Positive for hot flashes.  Genitourinary: Negative for difficulty urinating.   Musculoskeletal: Negative for arthralgias.  Skin: Negative for itching and rash.  Neurological: Negative for dizziness, extremity  weakness, headaches and numbness.  Hematological: Negative for adenopathy. Does not bruise/bleed easily.  Psychiatric/Behavioral: Negative for depression. The patient is not nervous/anxious.   Breast: Denies any new nodularity, masses, tenderness, nipple changes, or nipple discharge.       PAST MEDICAL/SURGICAL HISTORY:  Past Medical History:  Diagnosis Date  . Anemia   . Arthritis    osteo  . Blindness of right eye at birth  . Cancer ([place])    Left breast  . Cataract   . Depression   . Edema 07/27/2011  . Fibromyalgia    muscle weakness and pain  . GERD (gastroesophageal reflux disease)   . Hyperlipidemia   . Hypertension    benign  . Osteoporosis   . Plantar fasciitis   . Pre-diabetes   . Restless leg syndrome   . Rosacea   . Rotator cuff rupture   . Sleep apnea   . Synovitis   . Ulcer    Past Surgical History:  Procedure Laterality Date  . ABDOMINAL HYSTERECTOMY  1986   For bleeding and pain  . BLADDER SURGERY  2006   Bladder tack  . BREAST RECONSTRUCTION Bilateral 09/17/2012   Procedure: BREAST RECONSTRUCTION;  Surgeon: Theodoro Kos, DO;  Location: [place];  Service: Plastics;  Laterality: Bilateral;  BILATERAL BREAST RECONSTRUCTION WITH TISSUE EXPANDERS AND ALLOMED  . BREAST RECONSTRUCTION Right 06/03/2013   Procedure: RIGHT BREAST CAPSULE CONSTRACTURE;  Surgeon: Theodoro Kos, DO;  Location: [place];  Service: Plastics;  Laterality: Right;  . CARPAL TUNNEL RELEASE    . EYE SURGERY    .  INJECTION KNEE Left 08/12/2018   Procedure: KNEE INJECTION-LEFT;  Surgeon: Corky Mull, MD;  Location: [facility];  Service: Orthopedics;  Laterality: Left;  . LIPOSUCTION Bilateral 01/29/2013   Procedure: LIPOSUCTION;  Surgeon: Theodoro Kos, DO;  Location: [place];  Service: Plastics;  Laterality: Bilateral;  . LIPOSUCTION Right 06/03/2013   Procedure: LIPOSUCTION;  Surgeon: Theodoro Kos, DO;  Location: [place];  Service: Plastics;  Laterality: Right;  . MASTECTOMY  2012   rt prophalactic mast-snbx  . MASTECTOMY MODIFIED RADICAL  2012   left-axillary nodes  . NEUROMA SURGERY    . NOSE SURGERY  2007  . PORT-A-CATH REMOVAL     insertion and  . THROAT SURGERY    . TOTAL KNEE ARTHROPLASTY Right 08/12/2018   Procedure: TOTAL KNEE ARTHROPLASTY-RIGHT;  Surgeon: Corky Mull, MD;  Location: [facility];  Service: Orthopedics;  Laterality: Right;  . UVULOPALATOPHARYNGOPLASTY       ALLERGIES:  Allergies  Allergen Reactions  . Cephalexin Nausea Only    Diaphoresis / Sweating (intolerance)  . Levaquin [Levofloxacin] Other (See Comments)    Stiff joints, unable to walk.     CURRENT MEDICATIONS:  Outpatient Encounter Medications as of 04/03/2019  Medication Sig  . alendronate (FOSAMAX) 70 MG tablet Take 70 mg by mouth every Saturday. Take with a full glass of water on an empty stomach.   Marland Kitchen amLODipine (NORVASC) 10 MG tablet Take 10 mg by mouth daily.   Marland Kitchen atenolol (TENORMIN) 100 MG tablet Take 100 mg by mouth daily.    . Calcium Citrate-Vitamin D (CALCIUM + D PO) Take 1 tablet by mouth 2 (two) times daily.  . cholecalciferol (VITAMIN D3) 25 MCG (1000 UT) tablet Take 5,000 Units by mouth every evening.   . enoxaparin (LOVENOX) 40 MG/0.4ML injection Inject 0.4 mLs (40 mg total) into the skin daily.  . fluticasone (FLONASE) 50 MCG/ACT nasal spray Place 2 sprays into both nostrils 2 (two) times daily as needed for allergies.   . furosemide (LASIX) 20 MG tablet Take 20 mg by mouth 2 (two) times daily.   Marland Kitchen lisinopril-hydrochlorothiazide (PRINZIDE,ZESTORETIC) 20-25 MG per tablet Take 1 tablet by mouth every other day.   Marland Kitchen LYSINE ACETATE PO Take 4,000 mg by mouth daily as needed (cold sores).   . magnesium oxide (MAG-OX) 400 MG tablet Take 400 mg by mouth daily as needed (leg cramps).  . methocarbamol (ROBAXIN) 750 MG tablet Take 750 mg by mouth 2 (two) times daily as needed for muscle spasms.  . Misc  Natural Products (GLUCOSAMINE-CHONDROITIN PLUS PO) Take 1 tablet by mouth every evening.  . potassium chloride (K-DUR,KLOR-CON) 10 MEQ tablet Take 10 mEq by mouth 2 (two) times daily.  . pregabalin (LYRICA) 300 MG capsule Take 300 mg by mouth 2 (two) times daily.    . ranitidine (ZANTAC) 150 MG tablet Take 150 mg by mouth 2 (two) times daily.    Marland Kitchen rOPINIRole (REQUIP) 1 MG tablet Take 1 mg by mouth at bedtime.  . tamoxifen (NOLVADEX) 10 MG tablet TAKE 1 TABLET EVERY DAY  . triamcinolone (KENALOG) 0.025 % cream Apply 1 application topically daily as needed (rash).  . zolpidem (AMBIEN) 10 MG tablet Take 10 mg by mouth at bedtime.  . [DISCONTINUED] Carboxymethylcellulose Sodium (LUBRICANT EYE DROPS OP) Place 1 drop into both eyes daily as needed (dry eyes).  . [DISCONTINUED] cetirizine (ZYRTEC) 10 MG tablet Take 10 mg by mouth daily as needed for allergies.  . [DISCONTINUED] diphenhydrAMINE (  BENADRYL) 25 MG tablet Take 25 mg by mouth at bedtime as needed.  . [DISCONTINUED] oxyCODONE (OXY IR/ROXICODONE) 5 MG immediate release tablet Take 1-2 tablets (5-10 mg total) by mouth every 4 (four) hours as needed for moderate pain (pain score 4-6). (Patient not taking: Reported on 04/03/2019)  . [DISCONTINUED] traMADol (ULTRAM) 50 MG tablet Take 1-2 tablets (50-100 mg total) by mouth every 6 (six) hours as needed for moderate pain. (Patient not taking: Reported on 04/03/2019)   No facility-administered encounter medications on file as of 04/03/2019.      ONCOLOGIC FAMILY HISTORY:  Family History  Problem Relation Age of Onset  . Cancer Mother 41       non hodgkins lymphoma  . Cancer Father        neck, throat, lung  . Lung cancer Father   . Throat cancer Father   . Hypertension Sister   . Cancer Sister 71       Breast cancer dx'd 04/2011  . Hypertension Brother   . Breast cancer Maternal Aunt   . Liver cancer Maternal Uncle   . Cancer Maternal Grandmother        died 40, unknown cancer  . Cancer  Maternal Grandfather        died in his 65s; unknown cancer  . Lung cancer Maternal Uncle   . Throat cancer Maternal Uncle   . Breast cancer Maternal Aunt   . Cancer Cousin        died under the age 43, unknown cancer  . Cancer Cousin        died age 16; unknown cancer     PHYSICAL EXAMINATION:  Vital Signs: Vitals:   04/03/19 1057  BP: (!) 127/58  Pulse: 61  Resp: 17  Temp: 98.3 F (36.8 C)  SpO2: 100%   Filed Weights   04/03/19 1057  Weight: 188 lb 3.2 oz (85.4 kg)   General: Well-nourished, well-appearing female in no acute distress.  Unaccompanied today.   HEENT: Head is normocephalic.  Pupils equal and reactive to light. Conjunctivae clear without exudate.  Sclerae anicteric. Oral mucosa is pink, moist.  Oropharynx is pink without lesions or erythema.  Lymph: No cervical, supraclavicular, or infraclavicular lymphadenopathy noted on palpation.  Cardiovascular: Regular rate and rhythm.Marland Kitchen Respiratory: Clear to auscultation bilaterally. Chest expansion symmetric; breathing non-labored.  Breast Exam:  S/p bilateral mastectomies with implant placement, no swelling, nodules, masses or any sign of recurrence -Axilla: No axillary adenopathy bilaterally.  GI: Abdomen soft and round; non-tender, non-distended. Bowel sounds normoactive. No hepatosplenomegaly.   GU: Deferred.  Neuro: No focal deficits. Steady gait.  Psych: Mood and affect normal and appropriate for situation.  MSK: No focal spinal tenderness to palpation, full range of motion in bilateral upper extremities Extremities: No edema. Skin: Warm and dry.  LABORATORY DATA:  Appointment on 04/03/2019  Component Date Value Ref Range Status  . Sodium 04/03/2019 136  135 - 145 mmol/L Final  . Potassium 04/03/2019 3.9  3.5 - 5.1 mmol/L Final  . Chloride 04/03/2019 100  98 - 111 mmol/L Final  . CO2 04/03/2019 28  22 - 32 mmol/L Final  . Glucose, Bld 04/03/2019 104* 70 - 99 mg/dL Final  . BUN 04/03/2019 10  8 - 23 mg/dL  Final  . Creatinine 04/03/2019 1.14* 0.44 - 1.00 mg/dL Final  . Calcium 04/03/2019 9.0  8.9 - 10.3 mg/dL Final  . Total Protein 04/03/2019 6.9  6.5 - 8.1 g/dL Final  . Albumin 04/03/2019 4.1  3.5 - 5.0 g/dL Final  . AST 04/03/2019 136* 15 - 41 U/L Final  . ALT 04/03/2019 67* 0 - 44 U/L Final  . Alkaline Phosphatase 04/03/2019 67  38 - 126 U/L Final  . Total Bilirubin 04/03/2019 1.0  0.3 - 1.2 mg/dL Final  . GFR, Est Non Af Am 04/03/2019 50* >60 mL/min Final  . GFR, Est AFR Am 04/03/2019 58* >60 mL/min Final  . Anion gap 04/03/2019 8  5 - 15 Final   Performed at [facility], [place] [address], [place], [place] [address]  . WBC Count 04/03/2019 6.1  4.0 - 10.5 K/uL Final  . RBC 04/03/2019 4.17  3.87 - 5.11 MIL/uL Final  . Hemoglobin 04/03/2019 14.2  12.0 - 15.0 g/dL Final  . HCT 04/03/2019 40.9  36.0 - 46.0 % Final  . MCV 04/03/2019 98.1  80.0 - 100.0 fL Final  . MCH 04/03/2019 34.1* 26.0 - 34.0 pg Final  . MCHC 04/03/2019 34.7  30.0 - 36.0 g/dL Final  . RDW 04/03/2019 13.5  11.5 - 15.5 % Final  . Platelet Count 04/03/2019 209  150 - 400 K/uL Final  . nRBC 04/03/2019 0.0  0.0 - 0.2 % Final  . Neutrophils Relative % 04/03/2019 63  % Final  . Neutro Abs 04/03/2019 3.8  1.7 - 7.7 K/uL Final  . Lymphocytes Relative 04/03/2019 18  % Final  . Lymphs Abs 04/03/2019 1.1  0.7 - 4.0 K/uL Final  . Monocytes Relative 04/03/2019 9  % Final  . Monocytes Absolute 04/03/2019 0.6  0.1 - 1.0 K/uL Final  . Eosinophils Relative 04/03/2019 9  % Final  . Eosinophils Absolute 04/03/2019 0.6* 0.0 - 0.5 K/uL Final  . Basophils Relative 04/03/2019 1  % Final  . Basophils Absolute 04/03/2019 0.1  0.0 - 0.1 K/uL Final  . Immature Granulocytes 04/03/2019 0  % Final  . Abs Immature Granulocytes 04/03/2019 0.02  0.00 - 0.07 K/uL Final   Performed at [facility], [place] [address], [place], [place] [address]   Lab testing:   Appointment on 04/03/2019  Component  Date Value Ref Range Status  . Sodium 04/03/2019 136  135 - 145 mmol/L Final  . Potassium 04/03/2019 3.9  3.5 - 5.1 mmol/L Final  . Chloride 04/03/2019 100  98 - 111 mmol/L Final  . CO2 04/03/2019 28  22 - 32 mmol/L Final  . Glucose, Bld 04/03/2019 104* 70 - 99 mg/dL Final  . BUN 04/03/2019 10  8 - 23 mg/dL Final  . Creatinine 04/03/2019 1.14* 0.44 - 1.00 mg/dL Final  . Calcium 04/03/2019 9.0  8.9 - 10.3 mg/dL Final  . Total Protein 04/03/2019 6.9  6.5 - 8.1 g/dL Final  . Albumin 04/03/2019 4.1  3.5 - 5.0 g/dL Final  . AST 04/03/2019 136* 15 - 41 U/L Final  . ALT 04/03/2019 67* 0 - 44 U/L Final  . Alkaline Phosphatase 04/03/2019 67  38 - 126 U/L Final  . Total Bilirubin 04/03/2019 1.0  0.3 - 1.2 mg/dL Final  . GFR, Est Non Af Am 04/03/2019 50* >60 mL/min Final  . GFR, Est AFR Am 04/03/2019 58* >60 mL/min Final  . Anion gap 04/03/2019 8  5 - 15 Final   Performed at [facility], [place] [address] [place],  [address]  . WBC Count 04/03/2019 6.1  4.0 - 10.5 K/uL Final  . RBC 04/03/2019 4.17  3.87 - 5.11 MIL/uL Final  . Hemoglobin  04/03/2019 14.2  12.0 - 15.0 g/dL Final  . HCT 04/03/2019 40.9  36.0 - 46.0 % Final  . MCV 04/03/2019 98.1  80.0 - 100.0 fL Final  . MCH 04/03/2019 34.1* 26.0 - 34.0 pg Final  . MCHC 04/03/2019 34.7  30.0 - 36.0 g/dL Final  . RDW 04/03/2019 13.5  11.5 - 15.5 % Final  . Platelet Count 04/03/2019 209  150 - 400 K/uL Final  . nRBC 04/03/2019 0.0  0.0 - 0.2 % Final  . Neutrophils Relative % 04/03/2019 63  % Final  . Neutro Abs 04/03/2019 3.8  1.7 - 7.7 K/uL Final  . Lymphocytes Relative 04/03/2019 18  % Final  . Lymphs Abs 04/03/2019 1.1  0.7 - 4.0 K/uL Final  . Monocytes Relative 04/03/2019 9  % Final  . Monocytes Absolute 04/03/2019 0.6  0.1 - 1.0 K/uL Final  . Eosinophils Relative 04/03/2019 9  % Final  . Eosinophils Absolute 04/03/2019 0.6* 0.0 - 0.5 K/uL Final  . Basophils Relative 04/03/2019 1  % Final  . Basophils Absolute  04/03/2019 0.1  0.0 - 0.1 K/uL Final  . Immature Granulocytes 04/03/2019 0  % Final  . Abs Immature Granulocytes 04/03/2019 0.02  0.00 - 0.07 K/uL Final   Performed at [facility], [place] [address] [place], [place] [address]    DIAGNOSTIC IMAGING:  Most recent mammogram:  N/a s/p bilateral mastectomy    ASSESSMENT AND PLAN:  Ms.. Hyppolite is a pleasant 68 y.o. female with history of Stage IIA left breast invasive ductal carcinoma, ER+/PR+/HER2-, diagnosed in 12/2010, treated with bilateral mastectomy and axillary node dissection, adjuvant chemotherapy, and anti-estrogen therapy with Tamoxifen beginning in 07/2011 (reduced dose of 36m due to side effects).  She [facility] for surveillance and routine follow-up.   1. History of breast cancer:  Ms. MCasebeeris currently clinically without evidence of disease or recurrence of breast cancer. She is doing well today.  I drew her fasting labs today to send to her PCP.  She will return in one year for labs and follow up.  She is going to stop taking Tamoxifen due to side effects and having been on it for 7 years.  This is reasonable.  Her prognosis is quite good.  I encouraged her to call me with any questions or concerns before her next visit at the cancer center, and I would be happy to see her sooner, if needed.    2. Elevated LFTs: She is asymptomatic.  I recommended that she avoid tylenol, ETOH, and stop Tamoxifen.  She will have repeat CMET in 4 weeks with her PCP for further work up.  If she has any further LFT elevation, I would recommend a RUQ ultrasound.    3. Bone health:  Given Ms. Duby's age, history of breast cancer, she is at risk for bone demineralization. I counseled her that Tamoxifen has a protective effect on her bones. She was given education on specific food and activities to promote bone health.  I will defer to her PCP for future bone density testing and management.   4. Cancer  screening:  Due to Ms. Clay's history and her age, she should receive screening for skin cancers and colon cancer.  She was encouraged to follow-up with her PCP for appropriate cancer screenings.   5. Health maintenance and wellness promotion: Ms. MWaldronwas encouraged to consume 5-7 servings of fruits and vegetables per day. She was also encouraged to engage in  moderate to vigorous exercise for 30 minutes per day most days of the week. She was instructed to limit her alcohol consumption and continue to abstain from tobacco use.  At her request, I had some routine labs tested for her PCP.  I will have these results faxed to her PCP once they are available.        Dispo:  -Return to cancer center in one year for LTS follow up   A total of (20) minutes of face-to-face time was spent with this patient with greater than 50% of that time in counseling and care-coordination.   Gardenia Phlegm, [place] [phone]   Note: PRIMARY CARE PROVIDER [name], [place] [phone]

## 2019-04-04 LAB — HEMOGLOBIN A1C
Hgb A1c MFr Bld: 5.7 % — ABNORMAL HIGH (ref 4.8–5.6)
Mean Plasma Glucose: 117 mg/dL

## 2019-04-04 LAB — VITAMIN D 25 HYDROXY (VIT D DEFICIENCY, FRACTURES): Vit D, 25-Hydroxy: 32.1 ng/mL (ref 30.0–100.0)

## 2019-04-06 ENCOUNTER — Telehealth: Payer: Self-pay | Admitting: Adult Health

## 2019-04-06 NOTE — Telephone Encounter (Signed)
I talk with patient regarding schedule  

## 2019-04-10 ENCOUNTER — Telehealth: Payer: Self-pay | Admitting: *Deleted

## 2019-04-10 NOTE — Telephone Encounter (Signed)
Pt called requesting to speak with Wilber Bihari NP. Returned pt call. Left message and awaiting callback from pt.

## 2019-04-10 NOTE — Telephone Encounter (Signed)
Pt Krystal [name] called back regarding labs that were drawn on  04/03/19 being faxed to PCP Dr. Kary Kos and being mailed to home. Labs were faxed to PCP and put in outgoing mail to be mailed to pt.

## 2019-04-17 DIAGNOSIS — M1712 Unilateral primary osteoarthritis, left knee: Secondary | ICD-10-CM | POA: Diagnosis not present

## 2019-04-24 DIAGNOSIS — M1712 Unilateral primary osteoarthritis, left knee: Secondary | ICD-10-CM | POA: Diagnosis not present

## 2019-05-01 DIAGNOSIS — M1712 Unilateral primary osteoarthritis, left knee: Secondary | ICD-10-CM | POA: Diagnosis not present

## 2019-05-04 DIAGNOSIS — R748 Abnormal levels of other serum enzymes: Secondary | ICD-10-CM | POA: Diagnosis not present

## 2019-05-06 DIAGNOSIS — M9902 Segmental and somatic dysfunction of thoracic region: Secondary | ICD-10-CM | POA: Diagnosis not present

## 2019-05-06 DIAGNOSIS — M9903 Segmental and somatic dysfunction of lumbar region: Secondary | ICD-10-CM | POA: Diagnosis not present

## 2019-05-06 DIAGNOSIS — M5134 Other intervertebral disc degeneration, thoracic region: Secondary | ICD-10-CM | POA: Diagnosis not present

## 2019-05-06 DIAGNOSIS — M5136 Other intervertebral disc degeneration, lumbar region: Secondary | ICD-10-CM | POA: Diagnosis not present

## 2019-05-28 DIAGNOSIS — M1712 Unilateral primary osteoarthritis, left knee: Secondary | ICD-10-CM | POA: Diagnosis not present

## 2019-06-09 ENCOUNTER — Other Ambulatory Visit: Payer: Self-pay | Admitting: Gastroenterology

## 2019-06-09 DIAGNOSIS — R7401 Elevation of levels of liver transaminase levels: Secondary | ICD-10-CM

## 2019-06-09 DIAGNOSIS — Z1211 Encounter for screening for malignant neoplasm of colon: Secondary | ICD-10-CM | POA: Diagnosis not present

## 2019-06-09 DIAGNOSIS — F1010 Alcohol abuse, uncomplicated: Secondary | ICD-10-CM | POA: Diagnosis not present

## 2019-06-09 DIAGNOSIS — Z853 Personal history of malignant neoplasm of breast: Secondary | ICD-10-CM | POA: Diagnosis not present

## 2019-06-09 DIAGNOSIS — I10 Essential (primary) hypertension: Secondary | ICD-10-CM | POA: Diagnosis not present

## 2019-06-09 DIAGNOSIS — E785 Hyperlipidemia, unspecified: Secondary | ICD-10-CM | POA: Diagnosis not present

## 2019-06-09 DIAGNOSIS — M1990 Unspecified osteoarthritis, unspecified site: Secondary | ICD-10-CM | POA: Diagnosis not present

## 2019-06-09 DIAGNOSIS — M810 Age-related osteoporosis without current pathological fracture: Secondary | ICD-10-CM | POA: Diagnosis not present

## 2019-06-29 ENCOUNTER — Ambulatory Visit
Admission: RE | Admit: 2019-06-29 | Discharge: 2019-06-29 | Disposition: A | Discharge status: Home / Self Care | Payer: Medicare HMO | Source: Ambulatory Visit | Attending: Gastroenterology | Admitting: Gastroenterology

## 2019-06-29 ENCOUNTER — Other Ambulatory Visit: Payer: Self-pay

## 2019-06-29 DIAGNOSIS — R7401 Elevation of levels of liver transaminase levels: Secondary | ICD-10-CM

## 2019-06-29 DIAGNOSIS — M9903 Segmental and somatic dysfunction of lumbar region: Secondary | ICD-10-CM | POA: Diagnosis not present

## 2019-06-29 DIAGNOSIS — M5134 Other intervertebral disc degeneration, thoracic region: Secondary | ICD-10-CM | POA: Diagnosis not present

## 2019-06-29 DIAGNOSIS — M5136 Other intervertebral disc degeneration, lumbar region: Secondary | ICD-10-CM | POA: Diagnosis not present

## 2019-06-29 DIAGNOSIS — M9902 Segmental and somatic dysfunction of thoracic region: Secondary | ICD-10-CM | POA: Diagnosis not present

## 2019-06-29 DIAGNOSIS — K7689 Other specified diseases of liver: Secondary | ICD-10-CM | POA: Diagnosis not present

## 2019-07-01 DIAGNOSIS — N760 Acute vaginitis: Secondary | ICD-10-CM | POA: Diagnosis not present

## 2019-07-01 DIAGNOSIS — R300 Dysuria: Secondary | ICD-10-CM | POA: Diagnosis not present

## 2019-07-01 DIAGNOSIS — N390 Urinary tract infection, site not specified: Secondary | ICD-10-CM | POA: Diagnosis not present

## 2019-07-01 DIAGNOSIS — R350 Frequency of micturition: Secondary | ICD-10-CM | POA: Diagnosis not present

## 2019-07-22 DIAGNOSIS — I890 Lymphedema, not elsewhere classified: Secondary | ICD-10-CM | POA: Diagnosis not present

## 2019-07-22 DIAGNOSIS — I7300 Raynaud's syndrome without gangrene: Secondary | ICD-10-CM | POA: Diagnosis not present

## 2019-07-22 DIAGNOSIS — M1990 Unspecified osteoarthritis, unspecified site: Secondary | ICD-10-CM | POA: Diagnosis not present

## 2019-07-22 DIAGNOSIS — I208 Other forms of angina pectoris: Secondary | ICD-10-CM | POA: Diagnosis not present

## 2019-07-22 DIAGNOSIS — E785 Hyperlipidemia, unspecified: Secondary | ICD-10-CM | POA: Diagnosis not present

## 2019-07-22 DIAGNOSIS — F329 Major depressive disorder, single episode, unspecified: Secondary | ICD-10-CM | POA: Diagnosis not present

## 2019-07-22 DIAGNOSIS — I10 Essential (primary) hypertension: Secondary | ICD-10-CM | POA: Diagnosis not present

## 2019-07-27 DIAGNOSIS — Z853 Personal history of malignant neoplasm of breast: Secondary | ICD-10-CM | POA: Diagnosis not present

## 2019-07-27 DIAGNOSIS — M1712 Unilateral primary osteoarthritis, left knee: Secondary | ICD-10-CM | POA: Diagnosis not present

## 2019-07-27 DIAGNOSIS — Z421 Encounter for breast reconstruction following mastectomy: Secondary | ICD-10-CM | POA: Diagnosis not present

## 2019-07-27 DIAGNOSIS — R7989 Other specified abnormal findings of blood chemistry: Secondary | ICD-10-CM | POA: Diagnosis not present

## 2019-07-27 DIAGNOSIS — Z0000 Encounter for general adult medical examination without abnormal findings: Secondary | ICD-10-CM | POA: Diagnosis not present

## 2019-07-27 DIAGNOSIS — R739 Hyperglycemia, unspecified: Secondary | ICD-10-CM | POA: Diagnosis not present

## 2019-07-27 DIAGNOSIS — E785 Hyperlipidemia, unspecified: Secondary | ICD-10-CM | POA: Diagnosis not present

## 2019-07-27 DIAGNOSIS — E559 Vitamin D deficiency, unspecified: Secondary | ICD-10-CM | POA: Diagnosis not present

## 2019-07-27 DIAGNOSIS — I10 Essential (primary) hypertension: Secondary | ICD-10-CM | POA: Diagnosis not present

## 2019-07-28 DIAGNOSIS — R7989 Other specified abnormal findings of blood chemistry: Secondary | ICD-10-CM | POA: Diagnosis not present

## 2019-07-28 DIAGNOSIS — R739 Hyperglycemia, unspecified: Secondary | ICD-10-CM | POA: Diagnosis not present

## 2019-07-28 DIAGNOSIS — Z86002 Personal history of in-situ neoplasm of other and unspecified genital organs: Secondary | ICD-10-CM | POA: Diagnosis not present

## 2019-07-28 DIAGNOSIS — E785 Hyperlipidemia, unspecified: Secondary | ICD-10-CM | POA: Diagnosis not present

## 2019-07-28 DIAGNOSIS — E559 Vitamin D deficiency, unspecified: Secondary | ICD-10-CM | POA: Diagnosis not present

## 2019-07-28 DIAGNOSIS — I10 Essential (primary) hypertension: Secondary | ICD-10-CM | POA: Diagnosis not present

## 2019-08-31 DIAGNOSIS — M1712 Unilateral primary osteoarthritis, left knee: Secondary | ICD-10-CM | POA: Diagnosis not present

## 2019-09-03 ENCOUNTER — Other Ambulatory Visit: Payer: Self-pay | Admitting: Surgery

## 2019-09-15 ENCOUNTER — Encounter
Admission: RE | Admit: 2019-09-15 | Discharge: 2019-09-15 | Disposition: A | Discharge status: Home / Self Care | Payer: Medicare HMO | Source: Ambulatory Visit | Attending: Surgery | Admitting: Surgery

## 2019-09-15 ENCOUNTER — Other Ambulatory Visit: Payer: Self-pay

## 2019-09-15 DIAGNOSIS — Z01812 Encounter for preprocedural laboratory examination: Secondary | ICD-10-CM | POA: Diagnosis not present

## 2019-09-15 DIAGNOSIS — Z01810 Encounter for preprocedural cardiovascular examination: Secondary | ICD-10-CM | POA: Diagnosis not present

## 2019-09-15 HISTORY — DX: Raynaud's syndrome without gangrene: I73.00

## 2019-09-15 HISTORY — DX: Lymphedema, not elsewhere classified: I89.0

## 2019-09-15 HISTORY — DX: Carpal tunnel syndrome, unspecified upper limb: G56.00

## 2019-09-15 LAB — CBC WITH DIFFERENTIAL/PLATELET
Abs Immature Granulocytes: 0.04 10*3/uL (ref 0.00–0.07)
Basophils Absolute: 0.1 10*3/uL (ref 0.0–0.1)
Basophils Relative: 1 %
Eosinophils Absolute: 0.5 10*3/uL (ref 0.0–0.5)
Eosinophils Relative: 7 %
HCT: 42 % (ref 36.0–46.0)
Hemoglobin: 14.7 g/dL (ref 12.0–15.0)
Immature Granulocytes: 1 %
Lymphocytes Relative: 17 %
Lymphs Abs: 1.3 10*3/uL (ref 0.7–4.0)
MCH: 33.8 pg (ref 26.0–34.0)
MCHC: 35 g/dL (ref 30.0–36.0)
MCV: 96.6 fL (ref 80.0–100.0)
Monocytes Absolute: 0.7 10*3/uL (ref 0.1–1.0)
Monocytes Relative: 9 %
Neutro Abs: 5.1 10*3/uL (ref 1.7–7.7)
Neutrophils Relative %: 65 %
Platelets: 225 10*3/uL (ref 150–400)
RBC: 4.35 MIL/uL (ref 3.87–5.11)
RDW: 15.1 % (ref 11.5–15.5)
WBC: 7.8 10*3/uL (ref 4.0–10.5)
nRBC: 0 % (ref 0.0–0.2)

## 2019-09-15 LAB — SURGICAL PCR SCREEN
MRSA, PCR: NEGATIVE
Staphylococcus aureus: NEGATIVE

## 2019-09-15 LAB — COMPREHENSIVE METABOLIC PANEL
ALT: 72 U/L — ABNORMAL HIGH (ref 0–44)
AST: 100 U/L — ABNORMAL HIGH (ref 15–41)
Albumin: 3.9 g/dL (ref 3.5–5.0)
Alkaline Phosphatase: 69 U/L (ref 38–126)
Anion gap: 14 (ref 5–15)
BUN: 8 mg/dL (ref 8–23)
CO2: 24 mmol/L (ref 22–32)
Calcium: 9 mg/dL (ref 8.9–10.3)
Chloride: 95 mmol/L — ABNORMAL LOW (ref 98–111)
Creatinine, Ser: 1.15 mg/dL — ABNORMAL HIGH (ref 0.44–1.00)
GFR calc Af Amer: 57 mL/min — ABNORMAL LOW (ref >60)
GFR calc non Af Amer: 50 mL/min — ABNORMAL LOW (ref >60)
Glucose, Bld: 123 mg/dL — ABNORMAL HIGH (ref 70–99)
Potassium: 3.5 mmol/L (ref 3.5–5.1)
Sodium: 133 mmol/L — ABNORMAL LOW (ref 135–145)
Total Bilirubin: 1 mg/dL (ref 0.3–1.2)
Total Protein: 6.9 g/dL (ref 6.5–8.1)

## 2019-09-15 LAB — URINALYSIS, ROUTINE W REFLEX MICROSCOPIC
Bilirubin Urine: NEGATIVE
Glucose, UA: NEGATIVE mg/dL
Hgb urine dipstick: NEGATIVE
Ketones, ur: NEGATIVE mg/dL
Leukocytes,Ua: NEGATIVE
Nitrite: NEGATIVE
Protein, ur: NEGATIVE mg/dL
Specific Gravity, Urine: 1.006 (ref 1.005–1.030)
pH: 6 (ref 5.0–8.0)

## 2019-09-15 NOTE — Patient Instructions (Addendum)
Your procedure is scheduled on: Tuesday, March 2 Report to Day Surgery on the 2nd floor of the [facility]. To find out your arrival time, please call [phone] between 1PM - 3PM on: Monday, March 1  REMEMBER: Instructions that are not followed completely may result in serious medical risk, up to and including death; or upon the discretion of your surgeon and anesthesiologist your surgery may need to be rescheduled.  Do not eat food after midnight the night before surgery.  No gum chewing, lozengers or hard candies.  You may however, drink CLEAR liquids up to 2 hours before you are scheduled to arrive for your surgery. Do not drink anything within 2 hours of the start of your surgery.  Clear liquids include: - water  - apple juice without pulp - gatorade - black coffee or tea (Do NOT add milk or creamers to the coffee or tea) Do NOT drink anything that is not on this list.  ENSURE PRE-SURGERY CARBOHYDRATE DRINK:  Complete drinking 3 hours prior to surgery.  No Alcohol for 24 hours before or after surgery.  No Smoking including e-cigarettes for 24 hours prior to surgery.  No chewable tobacco products for at least 6 hours prior to surgery.  No nicotine patches on the day of surgery.  On the morning of surgery brush your teeth with toothpaste and water, you may rinse your mouth with mouthwash if you wish. Do not swallow any toothpaste or mouthwash.  Notify your doctor if there is any change in your medical condition (cold, fever, infection).  Do not wear jewelry, make-up, hairpins, clips or nail polish.  Do not wear lotions, powders, or perfumes.   Do not shave 48 hours prior to surgery.   Contacts and dentures may not be worn into surgery.  Do not bring valuables to the hospital, including drivers license, insurance or credit cards.  [facility] is not responsible for any belongings or valuables.   TAKE THESE MEDICATIONS THE MORNING OF SURGERY:  1.  Amlodipine 2.   Atenolol 3.  flonase nasal spray 4.  Omeprazole - (take one the night before and one on the morning of surgery - helps to prevent nausea after surgery.) 5.  pregabalin (Lyrica)  Use CHG Soap as directed on instruction sheet.  Stop Anti-inflammatories (NSAIDS) such as Advil, Aleve, Ibuprofen, Motrin, Naproxen, Naprosyn and Aspirin based products such as Excedrin, Goodys Powder, BC Powder. (May take Tylenol or Acetaminophen if needed.)  Stop ANY OVER THE COUNTER supplements until after surgery. (May continue Vitamin D, Vitamin B, and multivitamin.)  Wear comfortable clothing (specific to your surgery type) to the hospital.  If you are being discharged the day of surgery, you will not be allowed to drive home. You will need a responsible adult to drive you home and stay with you that night.   If you are taking public transportation, you will need to have a responsible adult with you. Please confirm with your physician that it is acceptable to use public transportation.   Please call [phone] if you have any questions about these instructions.

## 2019-09-16 LAB — TYPE AND SCREEN
ABO/RH(D): A POS
Antibody Screen: NEGATIVE

## 2019-09-18 ENCOUNTER — Other Ambulatory Visit: Payer: Self-pay

## 2019-09-18 ENCOUNTER — Other Ambulatory Visit
Admission: RE | Admit: 2019-09-18 | Discharge: 2019-09-18 | Disposition: A | Discharge status: Home / Self Care | Payer: Medicare HMO | Source: Ambulatory Visit | Attending: Surgery | Admitting: Surgery

## 2019-09-18 DIAGNOSIS — Z01812 Encounter for preprocedural laboratory examination: Secondary | ICD-10-CM | POA: Diagnosis not present

## 2019-09-18 DIAGNOSIS — Z20822 Contact with and (suspected) exposure to covid-19: Secondary | ICD-10-CM | POA: Diagnosis not present

## 2019-09-18 LAB — SARS CORONAVIRUS 2 (TAT 6-24 HRS): SARS Coronavirus 2: NEGATIVE

## 2019-09-21 MED ORDER — CLINDAMYCIN PHOSPHATE 900 MG/50ML IV SOLN
900.0000 mg | INTRAVENOUS | Status: AC
  Administered 2019-09-22: 900 mg via INTRAVENOUS

## 2019-09-22 ENCOUNTER — Inpatient Hospital Stay
Admission: RE | Admit: 2019-09-22 | Discharge: 2019-09-24 | DRG: 470 | Disposition: A | Discharge status: Home Health | Payer: Medicare HMO | Attending: Surgery | Admitting: Surgery

## 2019-09-22 ENCOUNTER — Other Ambulatory Visit: Payer: Self-pay

## 2019-09-22 ENCOUNTER — Inpatient Hospital Stay: Payer: Medicare HMO | Admitting: Anesthesiology

## 2019-09-22 ENCOUNTER — Encounter
Admission: RE | Disposition: A | Discharge status: Home Health | Payer: Self-pay | Source: Home / Self Care | Attending: Surgery

## 2019-09-22 ENCOUNTER — Encounter: Payer: Self-pay | Admitting: Surgery

## 2019-09-22 ENCOUNTER — Inpatient Hospital Stay: Payer: Medicare HMO

## 2019-09-22 DIAGNOSIS — Z791 Long term (current) use of non-steroidal anti-inflammatories (NSAID): Secondary | ICD-10-CM | POA: Diagnosis not present

## 2019-09-22 DIAGNOSIS — Z8541 Personal history of malignant neoplasm of cervix uteri: Secondary | ICD-10-CM

## 2019-09-22 DIAGNOSIS — I7300 Raynaud's syndrome without gangrene: Secondary | ICD-10-CM | POA: Diagnosis present

## 2019-09-22 DIAGNOSIS — H5461 Unqualified visual loss, right eye, normal vision left eye: Secondary | ICD-10-CM | POA: Diagnosis not present

## 2019-09-22 DIAGNOSIS — R7303 Prediabetes: Secondary | ICD-10-CM | POA: Diagnosis present

## 2019-09-22 DIAGNOSIS — G4730 Sleep apnea, unspecified: Secondary | ICD-10-CM | POA: Diagnosis present

## 2019-09-22 DIAGNOSIS — Z881 Allergy status to other antibiotic agents status: Secondary | ICD-10-CM | POA: Diagnosis not present

## 2019-09-22 DIAGNOSIS — Z853 Personal history of malignant neoplasm of breast: Secondary | ICD-10-CM | POA: Diagnosis not present

## 2019-09-22 DIAGNOSIS — Z96652 Presence of left artificial knee joint: Secondary | ICD-10-CM | POA: Diagnosis not present

## 2019-09-22 DIAGNOSIS — Z9013 Acquired absence of bilateral breasts and nipples: Secondary | ICD-10-CM

## 2019-09-22 DIAGNOSIS — Z7983 Long term (current) use of bisphosphonates: Secondary | ICD-10-CM | POA: Diagnosis not present

## 2019-09-22 DIAGNOSIS — G2581 Restless legs syndrome: Secondary | ICD-10-CM | POA: Diagnosis present

## 2019-09-22 DIAGNOSIS — K219 Gastro-esophageal reflux disease without esophagitis: Secondary | ICD-10-CM | POA: Diagnosis not present

## 2019-09-22 DIAGNOSIS — Z471 Aftercare following joint replacement surgery: Secondary | ICD-10-CM | POA: Diagnosis not present

## 2019-09-22 DIAGNOSIS — M797 Fibromyalgia: Secondary | ICD-10-CM | POA: Diagnosis not present

## 2019-09-22 DIAGNOSIS — R5082 Postprocedural fever: Secondary | ICD-10-CM

## 2019-09-22 DIAGNOSIS — Z90710 Acquired absence of both cervix and uterus: Secondary | ICD-10-CM | POA: Diagnosis not present

## 2019-09-22 DIAGNOSIS — Z79899 Other long term (current) drug therapy: Secondary | ICD-10-CM | POA: Diagnosis not present

## 2019-09-22 DIAGNOSIS — M810 Age-related osteoporosis without current pathological fracture: Secondary | ICD-10-CM | POA: Diagnosis present

## 2019-09-22 DIAGNOSIS — R509 Fever, unspecified: Secondary | ICD-10-CM | POA: Diagnosis not present

## 2019-09-22 DIAGNOSIS — F329 Major depressive disorder, single episode, unspecified: Secondary | ICD-10-CM | POA: Diagnosis present

## 2019-09-22 DIAGNOSIS — E785 Hyperlipidemia, unspecified: Secondary | ICD-10-CM | POA: Diagnosis present

## 2019-09-22 DIAGNOSIS — M1712 Unilateral primary osteoarthritis, left knee: Principal | ICD-10-CM | POA: Diagnosis present

## 2019-09-22 DIAGNOSIS — I10 Essential (primary) hypertension: Secondary | ICD-10-CM | POA: Diagnosis present

## 2019-09-22 DIAGNOSIS — R140 Abdominal distension (gaseous): Secondary | ICD-10-CM | POA: Diagnosis not present

## 2019-09-22 DIAGNOSIS — Z96651 Presence of right artificial knee joint: Secondary | ICD-10-CM | POA: Diagnosis present

## 2019-09-22 DIAGNOSIS — Z888 Allergy status to other drugs, medicaments and biological substances status: Secondary | ICD-10-CM | POA: Diagnosis not present

## 2019-09-22 HISTORY — PX: TOTAL KNEE ARTHROPLASTY: SHX125

## 2019-09-22 SURGERY — ARTHROPLASTY, KNEE, TOTAL
Anesthesia: Spinal | Site: Knee | Laterality: Left

## 2019-09-22 MED ORDER — KETOROLAC TROMETHAMINE 30 MG/ML IJ SOLN: 30.0000 mg | Freq: Once | INTRAMUSCULAR | Status: AC

## 2019-09-22 MED ORDER — LISINOPRIL 20 MG PO TABS
20.0000 mg | ORAL_TABLET | Freq: Every day | ORAL | Status: DC
  Administered 2019-09-23: 20 mg via ORAL
  Filled 2019-09-22: qty 1

## 2019-09-22 MED ORDER — POTASSIUM CHLORIDE CRYS ER 10 MEQ PO TBCR
10.0000 meq | EXTENDED_RELEASE_TABLET | Freq: Two times a day (BID) | ORAL | Status: DC
  Administered 2019-09-22 – 2019-09-24 (×4): 10 meq via ORAL
  Filled 2019-09-22 (×4): qty 1

## 2019-09-22 MED ORDER — BUPIVACAINE HCL (PF) 0.5 % IJ SOLN
INTRAMUSCULAR | Status: DC | PRN
  Administered 2019-09-22: 2.6 mL via INTRATHECAL

## 2019-09-22 MED ORDER — ACETAMINOPHEN 10 MG/ML IV SOLN
INTRAVENOUS | Status: DC | PRN
  Administered 2019-09-22: 1000 mg via INTRAVENOUS

## 2019-09-22 MED ORDER — OXYCODONE HCL 5 MG PO TABS
5.0000 mg | ORAL_TABLET | Freq: Once | ORAL | Status: AC
  Administered 2019-09-22: 5 mg via ORAL

## 2019-09-22 MED ORDER — FUROSEMIDE 20 MG PO TABS
20.0000 mg | ORAL_TABLET | Freq: Two times a day (BID) | ORAL | Status: DC
  Administered 2019-09-22 – 2019-09-24 (×4): 20 mg via ORAL
  Filled 2019-09-22 (×4): qty 1

## 2019-09-22 MED ORDER — PREGABALIN 75 MG PO CAPS
300.0000 mg | ORAL_CAPSULE | Freq: Two times a day (BID) | ORAL | Status: DC
  Administered 2019-09-22 – 2019-09-24 (×4): 300 mg via ORAL
  Filled 2019-09-22 (×4): qty 4

## 2019-09-22 MED ORDER — METOCLOPRAMIDE HCL 5 MG/ML IJ SOLN: 5.0000 mg | Freq: Three times a day (TID) | INTRAMUSCULAR | Status: DC | PRN

## 2019-09-22 MED ORDER — ACETAMINOPHEN 10 MG/ML IV SOLN
INTRAVENOUS | Status: AC
  Filled 2019-09-22: qty 100

## 2019-09-22 MED ORDER — DOCUSATE SODIUM 100 MG PO CAPS
100.0000 mg | ORAL_CAPSULE | Freq: Two times a day (BID) | ORAL | Status: DC
  Administered 2019-09-23 – 2019-09-24 (×3): 100 mg via ORAL
  Filled 2019-09-22 (×4): qty 1

## 2019-09-22 MED ORDER — MIDAZOLAM HCL 5 MG/5ML IJ SOLN
INTRAMUSCULAR | Status: DC | PRN
  Administered 2019-09-22: .5 mg via INTRAVENOUS
  Administered 2019-09-22: 1 mg via INTRAVENOUS
  Administered 2019-09-22: .5 mg via INTRAVENOUS

## 2019-09-22 MED ORDER — ONDANSETRON HCL 4 MG/2ML IJ SOLN: 4.0000 mg | Freq: Once | INTRAMUSCULAR | Status: DC | PRN

## 2019-09-22 MED ORDER — LIDOCAINE HCL (CARDIAC) PF 100 MG/5ML IV SOSY
PREFILLED_SYRINGE | INTRAVENOUS | Status: DC | PRN
  Administered 2019-09-22: 40 mg via INTRAVENOUS

## 2019-09-22 MED ORDER — SODIUM CHLORIDE FLUSH 0.9 % IV SOLN
INTRAVENOUS | Status: AC
  Filled 2019-09-22: qty 40

## 2019-09-22 MED ORDER — ROPINIROLE HCL 1 MG PO TABS
1.0000 mg | ORAL_TABLET | Freq: Every day | ORAL | Status: DC
  Administered 2019-09-22 – 2019-09-23 (×2): 1 mg via ORAL
  Filled 2019-09-22 (×2): qty 1

## 2019-09-22 MED ORDER — FLEET ENEMA 7-19 GM/118ML RE ENEM: 1.0000 | ENEMA | Freq: Once | RECTAL | Status: DC | PRN

## 2019-09-22 MED ORDER — MIDAZOLAM HCL 2 MG/2ML IJ SOLN
INTRAMUSCULAR | Status: AC
  Filled 2019-09-22: qty 2

## 2019-09-22 MED ORDER — SODIUM CHLORIDE 0.9 % IV SOLN
INTRAVENOUS | Status: DC | PRN
  Administered 2019-09-22: 10:00:00 60 mL

## 2019-09-22 MED ORDER — PHENYLEPHRINE HCL (PRESSORS) 10 MG/ML IV SOLN
INTRAVENOUS | Status: DC | PRN
  Administered 2019-09-22: 50 ug via INTRAVENOUS

## 2019-09-22 MED ORDER — PHENYLEPHRINE HCL (PRESSORS) 10 MG/ML IV SOLN
INTRAVENOUS | Status: AC
  Filled 2019-09-22: qty 1

## 2019-09-22 MED ORDER — TRAMADOL HCL 50 MG PO TABS: 50.0000 mg | ORAL_TABLET | Freq: Four times a day (QID) | ORAL | Status: DC | PRN

## 2019-09-22 MED ORDER — BUPIVACAINE-EPINEPHRINE (PF) 0.5% -1:200000 IJ SOLN
INTRAMUSCULAR | Status: DC | PRN
  Administered 2019-09-22: 30 mL

## 2019-09-22 MED ORDER — PROPOFOL 500 MG/50ML IV EMUL
INTRAVENOUS | Status: AC
  Filled 2019-09-22: qty 50

## 2019-09-22 MED ORDER — KETOROLAC TROMETHAMINE 30 MG/ML IJ SOLN
INTRAMUSCULAR | Status: AC
  Administered 2019-09-22: 30 mg via INTRAVENOUS
  Filled 2019-09-22: qty 1

## 2019-09-22 MED ORDER — SODIUM CHLORIDE 0.9 % IV SOLN
INTRAVENOUS | Status: DC | PRN
  Administered 2019-09-22: 30 ug/min via INTRAVENOUS

## 2019-09-22 MED ORDER — CARISOPRODOL 350 MG PO TABS
350.0000 mg | ORAL_TABLET | Freq: Two times a day (BID) | ORAL | Status: DC
  Administered 2019-09-23 – 2019-09-24 (×3): 350 mg via ORAL
  Filled 2019-09-22 (×4): qty 1

## 2019-09-22 MED ORDER — MAGNESIUM HYDROXIDE 400 MG/5ML PO SUSP
30.0000 mL | Freq: Every day | ORAL | Status: DC | PRN
  Administered 2019-09-23: 30 mL via ORAL
  Filled 2019-09-22: qty 30

## 2019-09-22 MED ORDER — CHLORHEXIDINE GLUCONATE 4 % EX LIQD: 60.0000 mL | Freq: Once | CUTANEOUS | Status: DC

## 2019-09-22 MED ORDER — PROPOFOL 10 MG/ML IV BOLUS
INTRAVENOUS | Status: AC
  Filled 2019-09-22: qty 20

## 2019-09-22 MED ORDER — AMLODIPINE BESYLATE 10 MG PO TABS
10.0000 mg | ORAL_TABLET | Freq: Every day | ORAL | Status: DC
  Administered 2019-09-23: 10 mg via ORAL
  Filled 2019-09-22: qty 1

## 2019-09-22 MED ORDER — MAGNESIUM OXIDE 400 (241.3 MG) MG PO TABS: 400.0000 mg | ORAL_TABLET | Freq: Every day | ORAL | Status: DC | PRN

## 2019-09-22 MED ORDER — HYDROCHLOROTHIAZIDE 25 MG PO TABS
25.0000 mg | ORAL_TABLET | Freq: Every day | ORAL | Status: DC
  Administered 2019-09-23: 25 mg via ORAL
  Filled 2019-09-22: qty 1

## 2019-09-22 MED ORDER — BISACODYL 10 MG RE SUPP: 10.0000 mg | Freq: Every day | RECTAL | Status: DC | PRN

## 2019-09-22 MED ORDER — METOCLOPRAMIDE HCL 10 MG PO TABS: 5.0000 mg | ORAL_TABLET | Freq: Three times a day (TID) | ORAL | Status: DC | PRN

## 2019-09-22 MED ORDER — LACTATED RINGERS IV SOLN: INTRAVENOUS | Status: DC

## 2019-09-22 MED ORDER — DIPHENHYDRAMINE HCL 12.5 MG/5ML PO ELIX
12.5000 mg | ORAL_SOLUTION | ORAL | Status: DC | PRN
  Administered 2019-09-23: 12.5 mg via ORAL
  Filled 2019-09-22: qty 5

## 2019-09-22 MED ORDER — OXYCODONE HCL 5 MG PO TABS
ORAL_TABLET | ORAL | Status: AC
  Filled 2019-09-22: qty 1

## 2019-09-22 MED ORDER — TRANEXAMIC ACID 1000 MG/10ML IV SOLN
INTRAVENOUS | Status: DC | PRN
  Administered 2019-09-22: 11:00:00 1000 mg via TOPICAL

## 2019-09-22 MED ORDER — BUPIVACAINE-EPINEPHRINE (PF) 0.5% -1:200000 IJ SOLN
INTRAMUSCULAR | Status: AC
  Filled 2019-09-22: qty 30

## 2019-09-22 MED ORDER — ONDANSETRON HCL 4 MG/2ML IJ SOLN
INTRAMUSCULAR | Status: DC | PRN
  Administered 2019-09-22: 4 mg via INTRAVENOUS

## 2019-09-22 MED ORDER — TRANEXAMIC ACID 1000 MG/10ML IV SOLN
INTRAVENOUS | Status: AC
  Filled 2019-09-22: qty 10

## 2019-09-22 MED ORDER — KETOROLAC TROMETHAMINE 15 MG/ML IJ SOLN
15.0000 mg | Freq: Four times a day (QID) | INTRAMUSCULAR | Status: AC
  Administered 2019-09-22 – 2019-09-23 (×4): 15 mg via INTRAVENOUS
  Filled 2019-09-22 (×4): qty 1

## 2019-09-22 MED ORDER — LISINOPRIL-HYDROCHLOROTHIAZIDE 20-25 MG PO TABS: 1.0000 | ORAL_TABLET | ORAL | Status: DC

## 2019-09-22 MED ORDER — VITAMIN D 25 MCG (1000 UNIT) PO TABS
1000.0000 [IU] | ORAL_TABLET | Freq: Every evening | ORAL | Status: DC
  Administered 2019-09-22 – 2019-09-23 (×2): 1000 [IU] via ORAL
  Filled 2019-09-22 (×2): qty 1

## 2019-09-22 MED ORDER — PROPOFOL 500 MG/50ML IV EMUL
INTRAVENOUS | Status: DC | PRN
  Administered 2019-09-22: 70 ug/kg/min via INTRAVENOUS

## 2019-09-22 MED ORDER — PROPOFOL 10 MG/ML IV BOLUS
INTRAVENOUS | Status: DC | PRN
  Administered 2019-09-22: 40 mg via INTRAVENOUS

## 2019-09-22 MED ORDER — ONDANSETRON HCL 4 MG PO TABS: 4.0000 mg | ORAL_TABLET | Freq: Four times a day (QID) | ORAL | Status: DC | PRN

## 2019-09-22 MED ORDER — CLINDAMYCIN PHOSPHATE 900 MG/50ML IV SOLN
INTRAVENOUS | Status: AC
  Filled 2019-09-22: qty 50

## 2019-09-22 MED ORDER — LIDOCAINE HCL (PF) 2 % IJ SOLN
INTRAMUSCULAR | Status: AC
  Filled 2019-09-22: qty 5

## 2019-09-22 MED ORDER — SODIUM CHLORIDE 0.9 % IV SOLN: INTRAVENOUS | Status: DC

## 2019-09-22 MED ORDER — FENTANYL CITRATE (PF) 100 MCG/2ML IJ SOLN
INTRAMUSCULAR | Status: AC
  Filled 2019-09-22: qty 2

## 2019-09-22 MED ORDER — ONDANSETRON HCL 4 MG/2ML IJ SOLN: 4.0000 mg | Freq: Four times a day (QID) | INTRAMUSCULAR | Status: DC | PRN

## 2019-09-22 MED ORDER — ENOXAPARIN SODIUM 40 MG/0.4ML ~~LOC~~ SOLN
40.0000 mg | SUBCUTANEOUS | Status: DC
  Administered 2019-09-23 – 2019-09-24 (×2): 40 mg via SUBCUTANEOUS
  Filled 2019-09-22 (×2): qty 0.4

## 2019-09-22 MED ORDER — PANTOPRAZOLE SODIUM 40 MG PO TBEC
40.0000 mg | DELAYED_RELEASE_TABLET | Freq: Every day | ORAL | Status: DC
  Administered 2019-09-23 – 2019-09-24 (×2): 40 mg via ORAL
  Filled 2019-09-22 (×2): qty 1

## 2019-09-22 MED ORDER — OXYCODONE HCL 5 MG PO TABS
5.0000 mg | ORAL_TABLET | ORAL | Status: DC | PRN
  Administered 2019-09-22: 10 mg via ORAL
  Administered 2019-09-22 – 2019-09-24 (×8): 5 mg via ORAL
  Filled 2019-09-22 (×2): qty 1
  Filled 2019-09-22: qty 2
  Filled 2019-09-22 (×6): qty 1

## 2019-09-22 MED ORDER — LIDOCAINE HCL (PF) 1 % IJ SOLN
INTRAMUSCULAR | Status: DC | PRN
  Administered 2019-09-22: 2 mL via SUBCUTANEOUS

## 2019-09-22 MED ORDER — ACETAMINOPHEN 500 MG PO TABS
1000.0000 mg | ORAL_TABLET | Freq: Four times a day (QID) | ORAL | Status: AC
  Administered 2019-09-22 – 2019-09-23 (×4): 1000 mg via ORAL
  Filled 2019-09-22 (×4): qty 2

## 2019-09-22 MED ORDER — FLUTICASONE PROPIONATE 50 MCG/ACT NA SUSP
1.0000 | Freq: Every day | NASAL | Status: DC
  Administered 2019-09-23 – 2019-09-24 (×2): 1 via NASAL
  Filled 2019-09-22: qty 16

## 2019-09-22 MED ORDER — BUPIVACAINE LIPOSOME 1.3 % IJ SUSP
INTRAMUSCULAR | Status: AC
  Filled 2019-09-22: qty 20

## 2019-09-22 MED ORDER — BUPIVACAINE HCL (PF) 0.5 % IJ SOLN
INTRAMUSCULAR | Status: AC
  Filled 2019-09-22: qty 10

## 2019-09-22 MED ORDER — ACETAMINOPHEN 325 MG PO TABS
325.0000 mg | ORAL_TABLET | Freq: Four times a day (QID) | ORAL | Status: DC | PRN
  Administered 2019-09-23: 650 mg via ORAL
  Filled 2019-09-22: qty 2

## 2019-09-22 MED ORDER — ONDANSETRON HCL 4 MG/2ML IJ SOLN
INTRAMUSCULAR | Status: AC
  Filled 2019-09-22: qty 2

## 2019-09-22 MED ORDER — ATENOLOL 25 MG PO TABS
100.0000 mg | ORAL_TABLET | Freq: Every day | ORAL | Status: DC
  Administered 2019-09-23 – 2019-09-24 (×2): 100 mg via ORAL
  Filled 2019-09-22 (×2): qty 4

## 2019-09-22 MED ORDER — FENTANYL CITRATE (PF) 100 MCG/2ML IJ SOLN: 25.0000 ug | INTRAMUSCULAR | Status: DC | PRN

## 2019-09-22 MED ORDER — CLINDAMYCIN PHOSPHATE 600 MG/50ML IV SOLN
600.0000 mg | Freq: Four times a day (QID) | INTRAVENOUS | Status: AC
  Administered 2019-09-22 – 2019-09-23 (×3): 600 mg via INTRAVENOUS
  Filled 2019-09-22 (×3): qty 50

## 2019-09-22 MED ORDER — ZOLPIDEM TARTRATE 5 MG PO TABS
5.0000 mg | ORAL_TABLET | Freq: Every day | ORAL | Status: DC
  Administered 2019-09-22 – 2019-09-23 (×2): 5 mg via ORAL
  Filled 2019-09-22 (×2): qty 1

## 2019-09-22 MED ORDER — HYDROMORPHONE HCL 1 MG/ML IJ SOLN: 0.2500 mg | INTRAMUSCULAR | Status: DC | PRN

## 2019-09-22 SURGICAL SUPPLY — 64 items
APL PRP STRL LF DISP 70% ISPRP (MISCELLANEOUS) ×1
BLADE SAW SAG 25X90X1.19 (BLADE) ×2 IMPLANT
BLADE SURG SZ20 CARB STEEL (BLADE) ×2 IMPLANT
BNDG CMPR STD VLCR NS LF 5.8X6 (GAUZE/BANDAGES/DRESSINGS) ×1
BNDG ELASTIC 6X5.8 VLCR NS LF (GAUZE/BANDAGES/DRESSINGS) ×2 IMPLANT
BRNG TIB 67X12 ANT STAB MDLR (Insert) ×1 IMPLANT
CANISTER SUCT 1200ML W/VALVE (MISCELLANEOUS) ×2 IMPLANT
CANISTER SUCT 3000ML PPV (MISCELLANEOUS) ×2 IMPLANT
CEMENT BONE R 1X40 (Cement) ×4 IMPLANT
CEMENT VACUUM MIXING SYSTEM (MISCELLANEOUS) ×2 IMPLANT
CHLORAPREP W/TINT 26 (MISCELLANEOUS) ×2 IMPLANT
COMP FEMORAL CRUC LEFT 62.5MM (Joint) ×2 IMPLANT
COMPONENT FEMRL CRUC LT 62.5MM (Joint) IMPLANT
COOLER POLAR GLACIER W/PUMP (MISCELLANEOUS) ×2 IMPLANT
COVER MAYO STAND REUSABLE (DRAPES) ×2 IMPLANT
COVER WAND RF STERILE (DRAPES) ×2 IMPLANT
CUFF TOURN SGL QUICK 24 (TOURNIQUET CUFF)
CUFF TOURN SGL QUICK 30 (TOURNIQUET CUFF) ×2
CUFF TRNQT CYL 24X4X16.5-23 (TOURNIQUET CUFF) IMPLANT
CUFF TRNQT CYL 30X4X21-28X (TOURNIQUET CUFF) IMPLANT
DRAPE 3/4 80X56 (DRAPES) ×2 IMPLANT
DRSG OPSITE POSTOP 4X10 (GAUZE/BANDAGES/DRESSINGS) ×2 IMPLANT
DRSG OPSITE POSTOP 4X8 (GAUZE/BANDAGES/DRESSINGS) ×2 IMPLANT
ELECT CAUTERY BLADE 6.4 (BLADE) ×2 IMPLANT
ELECT REM PT RETURN 9FT ADLT (ELECTROSURGICAL) ×2
ELECTRODE REM PT RTRN 9FT ADLT (ELECTROSURGICAL) ×1 IMPLANT
GLOVE BIO SURGEON STRL SZ7.5 (GLOVE) ×8 IMPLANT
GLOVE BIO SURGEON STRL SZ8 (GLOVE) ×8 IMPLANT
GLOVE BIOGEL PI IND STRL 8 (GLOVE) ×1 IMPLANT
GLOVE BIOGEL PI INDICATOR 8 (GLOVE) ×1
GLOVE INDICATOR 8.0 STRL GRN (GLOVE) ×2 IMPLANT
GOWN STRL REUS W/ TWL LRG LVL3 (GOWN DISPOSABLE) ×1 IMPLANT
GOWN STRL REUS W/ TWL XL LVL3 (GOWN DISPOSABLE) ×1 IMPLANT
GOWN STRL REUS W/TWL LRG LVL3 (GOWN DISPOSABLE) ×2
GOWN STRL REUS W/TWL XL LVL3 (GOWN DISPOSABLE) ×2
HOLDER FOLEY CATH W/STRAP (MISCELLANEOUS) ×2 IMPLANT
HOOD PEEL AWAY FLYTE STAYCOOL (MISCELLANEOUS) ×6 IMPLANT
INSERT TIB BEARING 67X12 (Insert) ×1 IMPLANT
KIT TURNOVER KIT A (KITS) ×2 IMPLANT
NDL SAFETY ECLIPSE 18X1.5 (NEEDLE) ×2 IMPLANT
NDL SPNL 20GX3.5 QUINCKE YW (NEEDLE) ×1 IMPLANT
NEEDLE HYPO 18GX1.5 SHARP (NEEDLE) ×4
NEEDLE SPNL 20GX3.5 QUINCKE YW (NEEDLE) ×2 IMPLANT
NS IRRIG 1000ML POUR BTL (IV SOLUTION) ×2 IMPLANT
PACK TOTAL KNEE (MISCELLANEOUS) ×2 IMPLANT
PAD WRAPON POLAR KNEE (MISCELLANEOUS) ×1 IMPLANT
PATELLA STD 34X8.5 (Orthopedic Implant) ×1 IMPLANT
PLATE INTERLOK 6700 (Plate) ×1 IMPLANT
PULSAVAC PLUS IRRIG FAN TIP (DISPOSABLE) ×2
SOL .9 NS 3000ML IRR  AL (IV SOLUTION) ×2
SOL .9 NS 3000ML IRR AL (IV SOLUTION) ×1
SOL .9 NS 3000ML IRR UROMATIC (IV SOLUTION) ×1 IMPLANT
STAPLER SKIN PROX 35W (STAPLE) ×2 IMPLANT
SUCTION FRAZIER HANDLE 10FR (MISCELLANEOUS) ×2
SUCTION TUBE FRAZIER 10FR DISP (MISCELLANEOUS) ×1 IMPLANT
SUT VIC AB 0 CT1 36 (SUTURE) ×6 IMPLANT
SUT VIC AB 2-0 CT1 27 (SUTURE) ×6
SUT VIC AB 2-0 CT1 TAPERPNT 27 (SUTURE) ×3 IMPLANT
SYR 10ML LL (SYRINGE) ×2 IMPLANT
SYR 20ML LL LF (SYRINGE) ×2 IMPLANT
SYR 30ML LL (SYRINGE) ×6 IMPLANT
TIP FAN IRRIG PULSAVAC PLUS (DISPOSABLE) ×1 IMPLANT
TRAY FOLEY MTR SLVR 16FR STAT (SET/KITS/TRAYS/PACK) ×2 IMPLANT
WRAPON POLAR PAD KNEE (MISCELLANEOUS) ×2

## 2019-09-22 NOTE — Anesthesia Preprocedure Evaluation (Addendum)
Anesthesia Evaluation  Patient identified by MRN, date of birth, ID band Patient awake    Reviewed: Allergy & Precautions, H&P , NPO status , Patient's Chart, lab work & pertinent test results  Airway Mallampati: II  TM Distance: >3 FB Neck ROM: full    Dental  (+) Teeth Intact, Implants   Pulmonary neg shortness of breath, sleep apnea , neg COPD, neg recent URI,           Cardiovascular hypertension, (-) angina(-) Past MI and (-) Cardiac Stents (-) dysrhythmias      Neuro/Psych PSYCHIATRIC DISORDERS Depression Fibromyalgia    GI/Hepatic Neg liver ROS, GERD  Controlled,  Endo/Other  negative endocrine ROS  Renal/GU      Musculoskeletal  (+) Arthritis , Fibromyalgia -  Abdominal   Peds  Hematology  (+) Blood dyscrasia, anemia ,   Anesthesia Other Findings Past Medical History: No date: Anemia No date: Arthritis     Comment:  osteo at birth: Blindness of right eye 2011: Cancer ([place])     Comment:  Left breast 2011 and cervical age 24 No date: Carpal tunnel syndrome No date: Cataract No date: Depression 07/27/2011: Edema No date: Fibromyalgia     Comment:  muscle weakness and pain No date: GERD (gastroesophageal reflux disease) No date: Hyperlipidemia No date: Hypertension     Comment:  benign No date: Lymphedema No date: Osteoporosis No date: Plantar fasciitis No date: Pre-diabetes No date: Raynaud's disease No date: Restless leg syndrome No date: Rosacea No date: Rotator cuff rupture No date: Sleep apnea No date: Synovitis No date: Ulcer  Past Surgical History: 1986: ABDOMINAL HYSTERECTOMY     Comment:  For bleeding and pain 2006: BLADDER SURGERY     Comment:  Bladder tack 09/17/2012: BREAST RECONSTRUCTION; Bilateral     Comment:  Procedure: BREAST RECONSTRUCTION;  Surgeon: Theodoro Kos, DO;  Location: [facility];                Service: Plastics;  Laterality:  Bilateral;  BILATERAL               BREAST RECONSTRUCTION WITH TISSUE EXPANDERS AND ALLOMED 06/03/2013: BREAST RECONSTRUCTION; Right     Comment:  Procedure: RIGHT BREAST CAPSULE CONSTRACTURE;  Surgeon:               Theodoro Kos, DO;  Location: [facility];               Service: Plastics;  Laterality: Right; 2008: CARPAL TUNNEL RELEASE; Bilateral No date: EYE SURGERY; Right 08/12/2018: INJECTION KNEE; Left     Comment:  Procedure: KNEE INJECTION-LEFT;  Surgeon: Corky Mull,              MD;  Location: [facility];  Service: Orthopedics;                Laterality: Left; No date: JOINT REPLACEMENT 01/29/2013: LIPOSUCTION; Bilateral     Comment:  Procedure: LIPOSUCTION;  Surgeon: Theodoro Kos, DO;                Location: [facility];  Service: Plastics;               Laterality: Bilateral; 06/03/2013: LIPOSUCTION; Right     Comment:  Procedure: LIPOSUCTION;  Surgeon: Theodoro Kos, DO;                Location: [facility];  Service: Plastics;               Laterality: Right; 2012: MASTECTOMY     Comment:  rt prophalactic mast-snbx 2012: MASTECTOMY MODIFIED RADICAL     Comment:  left-axillary nodes No date: NEUROMA SURGERY; Bilateral 2007: NOSE SURGERY No date: PORT-A-CATH REMOVAL     Comment:  insertion and No date: THROAT SURGERY No date: TONSILLECTOMY 08/12/2018: TOTAL KNEE ARTHROPLASTY; Right     Comment:  Procedure: TOTAL KNEE ARTHROPLASTY-RIGHT;  Surgeon:               Corky Mull, MD;  Location: [facility];  Service:               Orthopedics;  Laterality: Right; No date: UVULOPALATOPHARYNGOPLASTY     Reproductive/Obstetrics negative OB ROS                            Anesthesia Physical Anesthesia Plan  ASA: II  Anesthesia Plan: Spinal   Post-op Pain Management:    Induction:   PONV Risk Score and Plan:   Airway Management Planned: Natural Airway and Nasal Cannula  Additional Equipment:    Intra-op Plan:   Post-operative Plan:   Informed Consent: I have reviewed the patients History and Physical, chart, labs and discussed the procedure including the risks, benefits and alternatives for the proposed anesthesia with the patient or authorized representative who has indicated his/her understanding and acceptance.     Dental Advisory Given  Plan Discussed with: Anesthesiologist  Anesthesia Plan Comments:        Anesthesia Quick Evaluation

## 2019-09-22 NOTE — Transfer of Care (Signed)
Immediate Anesthesia Transfer of Care Note  Patient: Krystal [name]  Procedure(s) Performed: TOTAL KNEE ARTHROPLASTY (Left Knee)  Patient Location: PACU  Anesthesia Type:Spinal  Level of Consciousness: awake, alert  and oriented  Airway & Oxygen Therapy: Patient Spontanous Breathing  Post-op Assessment: Report given to RN and Post -op Vital signs reviewed and stable  Post vital signs: Reviewed and stable  Last Vitals:  Vitals Value Taken Time  BP 83/50 09/22/19 1114  Temp 36.3 C 09/22/19 1112  Pulse 69 09/22/19 1119  Resp 10 09/22/19 1119  SpO2 94 % 09/22/19 1119  Vitals shown include unvalidated device data.  Last Pain:  Vitals:   09/22/19 1112  TempSrc:   PainSc: 0-No pain         Complications: No apparent anesthesia complications

## 2019-09-22 NOTE — Evaluation (Signed)
Physical Therapy Evaluation Patient Details Name: Krystal [name] MRN: [id number] DOB: 09-24-1950 Today's Date: 09/22/2019   History of Present Illness  Pt is a 69 y.o. female s/p L TKA secondary DJD 09/22/19.  PMH includes R TKA 07/2018, sleep apnea, htn, fibromyalgia, anemia, blindness R eye, CTS, Raynaud's disease, rotator cuff rupture.  Clinical Impression  Prior to hospital admission, pt was ambulatory and lives with her husband; plans to stay on main level of home (bedroom on 2nd floor).  Currently pt is SBA semi-supine to sitting edge of bed; CGA with transfers; and CGA to ambulate a few feet bed to recliner with RW.  Pain L knee 8/10 beginning and end of session at rest (pt reports receiving pain medication prior to PT session).  L knee AROM flexion to 90 degrees in sitting.  Pt would benefit from skilled PT to address noted impairments and functional limitations (see below for any additional details).  Upon hospital discharge, pt would benefit from [place].    Follow Up Recommendations Home health PT    Equipment Recommendations  Rolling walker with 5" wheels;3in1 (PT)    Recommendations for Other Services       Precautions / Restrictions Precautions Precautions: Fall;Knee Precaution Booklet Issued: No Restrictions Weight Bearing Restrictions: Yes LLE Weight Bearing: Weight bearing as tolerated      Mobility  Bed Mobility Overal bed mobility: Needs Assistance Bed Mobility: Supine to Sit     Supine to sit: Supervision;HOB elevated     General bed mobility comments: mild increased effort to perform on own; used UE's to assist with moving L LE towards edge of bed  Transfers Overall transfer level: Needs assistance Equipment used: Rolling walker (2 wheeled) Transfers: Sit to/from Stand Sit to Stand: Min guard         General transfer comment: vc's for UE/LE placement; mild increased effort to stand from bed up to walker  Ambulation/Gait Ambulation/Gait assistance: Min  guard Gait Distance (Feet): 3 Feet(bed to recliner) Assistive device: Rolling walker (2 wheeled)   Gait velocity: decreased   General Gait Details: antalgic; decreased stance time L LE  Stairs            Wheelchair Mobility    Modified Rankin (Stroke Patients Only)       Balance Overall balance assessment: Needs assistance Sitting-balance support: No upper extremity supported;Feet supported Sitting balance-Leahy Scale: Good Sitting balance - Comments: steaday sitting reaching within BOS   Standing balance support: Single extremity supported Standing balance-Leahy Scale: Poor Standing balance comment: pt requiring at least single UE support for static standing balance                             Pertinent Vitals/Pain Pain Assessment: 0-10 Pain Score: 8  Pain Location: L knee Pain Descriptors / Indicators: Aching;Sore Pain Intervention(s): Limited activity within patient's tolerance;Monitored during session;Premedicated before session;Repositioned;Other (comment)(polar care applied and activated)  Vitals (HR and O2 on room air) stable and WFL throughout treatment session.    Home Living Family/patient expects to be discharged to:: Private residence Living Arrangements: Spouse/significant other Available Help at Discharge: Family Type of Home: House Home Access: Stairs to enter Entrance Stairs-Rails: Right Entrance Stairs-Number of Steps: 3 (from back door) Home Layout: Two level;1/2 bath on main level;Bed/bath upstairs Home Equipment: [place] - 2 wheels;Cane - single point;Bedside commode      Prior Function Level of Independence: Independent  Comments: Pt reports no falls in past 6 months.  Plans to sleep on air mattress on main floor.     Hand Dominance        Extremity/Trunk Assessment   Upper Extremity Assessment Upper Extremity Assessment: Overall WFL for tasks assessed    Lower Extremity Assessment Lower Extremity  Assessment: RLE deficits/detail;LLE deficits/detail RLE Deficits / Details: strength and ROM WFL LLE Deficits / Details: good quad set; at least 3/5 hip flexion; good L ankle DF/PF    Cervical / Trunk Assessment Cervical / Trunk Assessment: Normal  Communication   Communication: No difficulties  Cognition Arousal/Alertness: Awake/alert Behavior During Therapy: WFL for tasks assessed/performed Overall Cognitive Status: Within Functional Limits for tasks assessed                                        General Comments   Nursing cleared pt for participation in physical therapy.  Pt agreeable to PT session.    Exercises Total Joint Exercises Ankle Circles/Pumps: AROM;Strengthening;Both;10 reps;Supine Quad Sets: AROM;Strengthening;Left;10 reps;Supine Short Arc Quad: AAROM;Strengthening;Left;10 reps;Supine Heel Slides: AAROM;Strengthening;Left;10 reps;Supine Hip ABduction/ADduction: AAROM;Strengthening;Left;10 reps;Supine Goniometric ROM: L knee AROM extension 10 degrees short of neutral semi-supine in bed and L knee flexion to 90 degrees sitting edge of recliner   Assessment/Plan    PT Assessment Patient needs continued PT services  PT Problem List Decreased strength;Decreased range of motion;Decreased activity tolerance;Decreased balance;Decreased mobility;Decreased knowledge of precautions;Pain;Decreased skin integrity       PT Treatment Interventions DME instruction;Gait training;Stair training;Functional mobility training;Therapeutic activities;Therapeutic exercise;Balance training;Patient/family education    PT Goals (Current goals can be found in the Care Plan section)  Acute Rehab PT Goals Patient Stated Goal: to go home PT Goal Formulation: With patient Time For Goal Achievement: 10/06/19 Potential to Achieve Goals: Good    Frequency BID   Barriers to discharge        Co-evaluation               AM-PAC PT "6 Clicks" Mobility  Outcome  Measure Help needed turning from your back to your side while in a flat bed without using bedrails?: A Little Help needed moving from lying on your back to sitting on the side of a flat bed without using bedrails?: A Little Help needed moving to and from a bed to a chair (including a wheelchair)?: A Little Help needed standing up from a chair using your arms (e.g., wheelchair or bedside chair)?: A Little Help needed to walk in hospital room?: A Little Help needed climbing 3-5 steps with a railing? : A Little 6 Click Score: 18    End of Session Equipment Utilized During Treatment: Gait belt Activity Tolerance: Patient tolerated treatment well Patient left: in chair;with call bell/phone within reach;with chair alarm set;with SCD's reapplied;Other (comment)(B heels floating via towel rolls; polar care in place and activated) Nurse Communication: Mobility status;Precautions;Weight bearing status(via white board) PT Visit Diagnosis: Other abnormalities of gait and mobility (R26.89);Muscle weakness (generalized) (M62.81);Pain Pain - Right/Left: Left Pain - part of body: Knee    Time: [id number] PT Time Calculation (min) (ACUTE ONLY): 29 min   Charges:   PT Evaluation $PT Eval Low Complexity: 1 Low PT Treatments $Therapeutic Exercise: 8-22 mins        Leitha Bleak, PT 09/22/19, 5:27 PM

## 2019-09-22 NOTE — H&P (Signed)
Paper H&P to be scanned into permanent record. H&P reviewed and patient re-examined. No changes. 

## 2019-09-22 NOTE — Anesthesia Procedure Notes (Addendum)
Spinal  Patient location during procedure: OR Start time: 09/22/2019 8:51 AM End time: 09/22/2019 8:53 AM Staffing Performed: anesthesiologist  Anesthesiologist: Tera Mater, MD Preanesthetic Checklist Completed: patient identified, IV checked, site marked, risks and benefits discussed, surgical consent, monitors and equipment checked, pre-op evaluation and timeout performed Spinal Block Patient position: sitting Prep: ChloraPrep Patient monitoring: heart rate, continuous pulse ox, blood pressure and cardiac monitor Approach: midline Location: L3-4 Injection technique: single-shot Needle Needle type: [place]acre and Introducer  Needle gauge: 24 G Needle length: 9 cm Additional Notes Negative paresthesia. Negative blood return. Positive free-flowing CSF. Expiration date of kit checked and confirmed. Patient tolerated procedure well, without complications.

## 2019-09-22 NOTE — Op Note (Signed)
09/22/2019  11:12 AM  Patient:   Krystal [name]  Pre-Op Diagnosis:   Degenerative joint disease, left knee.  Post-Op Diagnosis:   Same  Procedure:   Left TKA using all-cemented [employer] Vanguard system with a 62.5 mm PCR femur, a 67 mm tibial tray with a 12 mm anterior stabilized E-poly insert, and a 34 x 8.5 mm all-poly 3-pegged domed patella.  Surgeon:   Pascal Lux, MD  Assistant:   Cameron Proud, PA-C   Anesthesia:   Spinal  Findings:   As above  Complications:   None  EBL:   10 cc  Fluids:   1200 cc crystalloid  UOP:   500 cc  TT:   90 minutes at 300 mmHg  Drains:   None  Closure:   Staples  Implants:   As above  Brief Clinical Note:   The patient is a 69 year old female with a long history of progressively worsening left knee pain. The patient's symptoms have progressed despite medications, activity modification, injections, etc. The patient's history and examination were consistent with advanced degenerative joint disease of the left knee confirmed by plain radiographs. The patient presents at this time for a left total knee arthroplasty.  Procedure:   The patient was brought into the operating room. After adequate spinal anesthesia was obtained, the patient was lain in the supine position. A Foley catheter was placed by the nurse before the left lower extremity was prepped with ChloraPrep solution and draped sterilely. Preoperative antibiotics were administered. After verifying the proper laterality with a surgical timeout, the limb was exsanguinated with an Esmarch and the tourniquet inflated to 300 mmHg. A standard anterior approach to the knee was made through an approximately 7 inch incision. The incision was carried down through the subcutaneous tissues to expose superficial retinaculum. This was split the length of the incision and the medial flap elevated sufficiently to expose the medial retinaculum. The medial retinaculum was incised, leaving a 3-4 mm cuff of  tissue on the patella. This was extended distally along the medial border of the patellar tendon and proximally through the medial third of the quadriceps tendon. A subtotal fat pad excision was performed before the soft tissues were elevated off the anteromedial and anterolateral aspects of the proximal tibia to the level of the collateral ligaments. The anterior portions of the medial and lateral menisci were removed, as was the anterior cruciate ligament. With the knee flexed to 90, the external tibial guide was positioned and the appropriate proximal tibial cut made. This piece was taken to the back table where it was measured and found to be optimally replicated by a 67 mm component.  Attention was directed to the distal femur. The intramedullary canal was accessed through a 3/8" drill hole. The intramedullary guide was inserted and positioned in order to obtain a neutral flexion gap. The intercondylar block was positioned with care taken to avoid notching the anterior cortex of the femur. The appropriate cut was made. Next, the distal cutting block was placed at 6 of valgus alignment. Using the 9 mm slot, the distal cut was made. The distal femur was measured and found to be optimally replicated by the 123XX123 mm component. The 62.5 mm 4-in-1 cutting block was positioned and first the posterior, then the posterior chamfer, the anterior chamfer, and finally the anterior cuts were made. At this point, the posterior portions medial and lateral menisci were removed. A trial reduction was performed using the appropriate femoral and tibial components with  first the 10 mm and then the 12 mm insert. The 12 mm insert demonstrated excellent stability to varus and valgus stressing both in flexion and extension while permitting full extension. Patella tracking was assessed and found to be excellent. Therefore, the tibial guide position was marked on the proximal tibia. The patella thickness was measured and found to be  20 mm. Therefore, the appropriate cut was made. The patellar surface was measured and found to be optimally replicated by the 34 mm component. The three peg holes were drilled in place before the trial button was inserted. Patella tracking was assessed and found to be excellent, passing the "no thumb test". The lug holes were drilled into the distal femur before the trial component was removed, leaving only the tibial tray. The keel was then created using the appropriate tower, reamer, and punch.  The bony surfaces were prepared for cementing by irrigating them thoroughly with bacitracin saline solution via the jet lavage system. A bone plug was fashioned from some of the bone that had been removed previously and used to plug the distal femoral canal. In addition, 20 cc of Exparel diluted out to 60 cc with normal saline and 30 cc of 0.5% Sensorcaine were injected into the postero-medial and postero-lateral aspects of the knee, the medial and lateral gutter regions, and the peri-incisional tissues to help with postoperative analgesia. Meanwhile, the cement was being mixed on the back table. When it was ready, the tibial tray was cemented in first. The excess cement was removed using [employer] fast streamer. Next, the femoral component was impacted into place. Again, the excess cement was removed using [employer] fast streamer. The 12 mm trial insert was positioned and the knee brought into extension while the cement hardened. Finally, the patella was cemented into place and secured using the patellar clamp. Again, the excess cement was removed using [employer] fast streamer. Once the cement had hardened, the knee was placed through a range of motion with the findings as described above. Therefore, the trial insert was removed and, after verifying that no cement had been retained posteriorly, the permanent 12 mm anterior stabilized E-polyethylene insert was positioned and secured using the appropriate key locking mechanism. Again the knee  was placed through a range of motion with the findings as described above.  The wound was copiously irrigated with sterile saline solution using the jet lavage system before the quadriceps tendon and retinacular layer were reapproximated using #0 Vicryl interrupted sutures. The superficial retinacular layer also was closed using a running #0 Vicryl suture. A total of 10 cc of transexemic acid (TXA) was injected intra-articularly before the subcutaneous tissues were closed in several layers using 2-0 Vicryl interrupted sutures. The skin was closed using staples. A sterile honeycomb dressing was applied to the skin before the leg was wrapped with an Ace wrap to accommodate the Polar Care device. The patient was then awakened and returned to the recovery room in satisfactory condition after tolerating the procedure well.

## 2019-09-23 LAB — CBC
HCT: 38.7 % (ref 36.0–46.0)
Hemoglobin: 13 g/dL (ref 12.0–15.0)
MCH: 34.3 pg — ABNORMAL HIGH (ref 26.0–34.0)
MCHC: 33.6 g/dL (ref 30.0–36.0)
MCV: 102.1 fL — ABNORMAL HIGH (ref 80.0–100.0)
Platelets: 182 10*3/uL (ref 150–400)
RBC: 3.79 MIL/uL — ABNORMAL LOW (ref 3.87–5.11)
RDW: 15.8 % — ABNORMAL HIGH (ref 11.5–15.5)
WBC: 9.1 10*3/uL (ref 4.0–10.5)
nRBC: 0 % (ref 0.0–0.2)

## 2019-09-23 LAB — BASIC METABOLIC PANEL
Anion gap: 12 (ref 5–15)
BUN: 14 mg/dL (ref 8–23)
CO2: 26 mmol/L (ref 22–32)
Calcium: 8.3 mg/dL — ABNORMAL LOW (ref 8.9–10.3)
Chloride: 94 mmol/L — ABNORMAL LOW (ref 98–111)
Creatinine, Ser: 1.31 mg/dL — ABNORMAL HIGH (ref 0.44–1.00)
GFR calc Af Amer: 49 mL/min — ABNORMAL LOW (ref >60)
GFR calc non Af Amer: 42 mL/min — ABNORMAL LOW (ref >60)
Glucose, Bld: 124 mg/dL — ABNORMAL HIGH (ref 70–99)
Potassium: 3.8 mmol/L (ref 3.5–5.1)
Sodium: 132 mmol/L — ABNORMAL LOW (ref 135–145)

## 2019-09-23 MED ORDER — TRAMADOL HCL 50 MG PO TABS: 50.0000 mg | ORAL_TABLET | Freq: Four times a day (QID) | ORAL | 0 refills | Status: DC | PRN

## 2019-09-23 MED ORDER — OXYCODONE HCL 5 MG PO TABS: 5.0000 mg | ORAL_TABLET | ORAL | 0 refills | Status: DC | PRN

## 2019-09-23 MED ORDER — ENOXAPARIN SODIUM 40 MG/0.4ML ~~LOC~~ SOLN: 40.0000 mg | SUBCUTANEOUS | 0 refills | Status: DC

## 2019-09-23 NOTE — TOC Initial Note (Signed)
Transition of Care [facility]) - Initial/Assessment Note    Patient Details  Name: Krystal [name] MRN: [id number] Date of Birth: 1950/08/08  [facility] CM/SW Contact:    Su Hilt, RN Phone Number: 09/23/2019, 3:18 PM  Clinical Narrative:                  Met with the patient to discuss DC plan and needs She lives at home with her husband and has DME  BSC, RW, and cane at home, She is set up with [facility] She has transportation with her husband and is up to date with her PCP, she can afford her medications NO additional needs  Expected Discharge Plan: [place] Barriers to Discharge: Continued Medical Work up   Patient Goals and CMS Choice Patient states their goals for this hospitalization and ongoing recovery are:: Go Home      Expected Discharge Plan and Services Expected Discharge Plan: [place]   Discharge Planning Services: CM Consult   Living arrangements for the past 2 months: Single Family Home                 DME Arranged: N/A   Date DME Agency Contacted: 09/23/19 Time DME Agency Contacted: 55   [place]: PT [place] Agency: [facility] [employer] Date [place]: 09/23/19 Time [place]: [place] Representative spoke with at [place]: Helene Kelp  Prior Living Arrangements/Services Living arrangements for the past 2 months: [place] with:: Spouse Patient language and need for interpreter reviewed:: Yes Do you feel safe going back to the place where you live?: Yes      Need for Family Participation in Patient Care: No (Comment) Care giver support system in place?: Yes (comment) Current home services: DME(RW, BSC, cane) Criminal Activity/Legal Involvement Pertinent to Current Situation/Hospitalization: No - Comment as needed  Activities of Daily Living Home Assistive Devices/Equipment: Eyeglasses, Environmental consultant (specify type), Cane (specify quad or  straight), Bedside commode/3-in-1, Other (Comment) ADL Screening (condition at time of admission) Patient's cognitive ability adequate to safely complete daily activities?: Yes Is the patient deaf or have difficulty hearing?: No Does the patient have difficulty seeing, even when wearing glasses/contacts?: No Does the patient have difficulty concentrating, remembering, or making decisions?: No Patient able to express need for assistance with ADLs?: Yes Does the patient have difficulty dressing or bathing?: No Independently performs ADLs?: Yes (appropriate for developmental age) Does the patient have difficulty walking or climbing stairs?: No Weakness of Legs: None Weakness of Arms/Hands: None  Permission Sought/Granted                  Emotional Assessment Appearance:: Appears stated age Attitude/Demeanor/Rapport: Engaged Affect (typically observed): Appropriate Orientation: : Oriented to Situation, Oriented to  Time, Oriented to Place, Oriented to Self Alcohol / Substance Use: Not Applicable Psych Involvement: No (comment)  Admission diagnosis:  Status post total knee replacement using cement, left [Z96.652] Patient Active Problem List   Diagnosis Date Noted  . Status post total knee replacement using cement, left 09/22/2019  . Status post total knee replacement using cement, right 08/12/2018  . Rotator cuff rupture   . Acquired absence of bilateral breasts and nipples 09/17/2012  . Edema 07/27/2011  . Breast cancer of lower-outer quadrant of left female breast ([place]) 01/16/2011   PCP:  [name], MD Pharmacy:   [id number] [place]  [place] [address] Phone: [phone] Fax: [phone]  [facility] [place], [address] [address] Phone: [phone] Fax: [phone]     Social Determinants of Health (SDOH) Interventions    Readmission  Risk Interventions No flowsheet data found.

## 2019-09-23 NOTE — Plan of Care (Signed)

## 2019-09-23 NOTE — TOC Progression Note (Signed)
Transition of Care [facility]) - Progression Note    Patient Details  Name: Krystal [name] MRN: [id number] Date of Birth: April 14, 1951  Transition of Care [facility]) CM/SW [place], RN Phone Number: 09/23/2019, 9:11 AM  Clinical Narrative:    Went to the bedside to discuss DC plan and needs with the patient, she is working with PT at the moment and agrees to me coming back in a while to discuss        Expected Discharge Plan and Services                                                 Social Determinants of Health (SDOH) Interventions    Readmission Risk Interventions No flowsheet data found.

## 2019-09-23 NOTE — TOC Progression Note (Signed)
Transition of Care [facility]) - Progression Note    Patient Details  Name: Krystal [name] MRN: [id number] Date of Birth: Dec 24, 1950  Transition of Care [facility]) CM/SW [place], RN Phone Number: 09/23/2019, 9:08 AM  Clinical Narrative:     Requested the price of Lovenox for the patient, will notify of the price once obtained      Expected Discharge Plan and Services                                                 Social Determinants of Health (SDOH) Interventions    Readmission Risk Interventions No flowsheet data found.

## 2019-09-23 NOTE — Progress Notes (Signed)
Physical Therapy Treatment Patient Details Name: Krystal [name] MRN: [id number] DOB: 24-Dec-1950 Today's Date: 09/23/2019    History of Present Illness Pt is a 69 y.o. female s/p L TKA secondary DJD 09/22/19.  PMH includes R TKA 07/2018, sleep apnea, htn, fibromyalgia, anemia, blindness R eye, CTS, Raynaud's disease, rotator cuff rupture.    PT Comments    Pt reporting her L knee is feeling better this afternoon and able to ambulate 200 feet with RW CGA; improved gait pattern noted with vc's and visual cues for gait mechanics.  Tolerated L LE ex's in bed fairly well.  Pain 6/10 L knee beginning and end of session at rest.  Will continue to focus on ROM, strengthening, and progressive functional mobility per pt tolerance.  Plan to trial stairs next session.    Follow Up Recommendations  Home health PT     Equipment Recommendations  Rolling walker with 5" wheels;3in1 (PT)    Recommendations for Other Services       Precautions / Restrictions Precautions Precautions: Fall;Knee Precaution Booklet Issued: Yes (comment) Restrictions Weight Bearing Restrictions: Yes LLE Weight Bearing: Weight bearing as tolerated    Mobility  Bed Mobility Overal bed mobility: Needs Assistance Bed Mobility: Sit to Supine     Sit to supine: Supervision;HOB elevated   General bed mobility comments: mild increased effort to perform on own  Transfers Overall transfer level: Needs assistance Equipment used: Rolling walker (2 wheeled) Transfers: Sit to/from Stand Sit to Stand: Supervision         General transfer comment: fairly strong stand from recliner and controlled descent sitting onto bed; no vc's required for UE/LE positioning  Ambulation/Gait Ambulation/Gait assistance: Min guard Gait Distance (Feet): (200) Assistive device: Rolling walker (2 wheeled)   Gait velocity: decreased   General Gait Details: initially step to gait pattern progressing to partial step through gait pattern; vc's  for relaxing L knee (increasing knee flexion during swing phase) and then for L heel strike (pt noted to be circumducting L LE otherwise with stiff L LE gait mechanics)   Stairs             Wheelchair Mobility    Modified Rankin (Stroke Patients Only)       Balance Overall balance assessment: Needs assistance Sitting-balance support: No upper extremity supported;Feet supported Sitting balance-Leahy Scale: Good Sitting balance - Comments: steady sitting reaching outside BOS   Standing balance support: No upper extremity supported Standing balance-Leahy Scale: Good Standing balance comment: steady standing reaching within BOS                            Cognition Arousal/Alertness: Awake/alert Behavior During Therapy: WFL for tasks assessed/performed Overall Cognitive Status: Within Functional Limits for tasks assessed                                        Exercises Total Joint Exercises Quad Sets: AROM;Strengthening;Left;10 reps;Supine Heel Slides: AAROM;Strengthening;Left;10 reps;Supine Straight Leg Raises: AAROM;Strengthening;Left;10 reps;Supine    General Comments  Pt agreeable to PT session.      Pertinent Vitals/Pain Pain Assessment: 0-10 Pain Score: 6  Pain Location: L knee Pain Descriptors / Indicators: Aching;Sore;Burning Pain Intervention(s): Limited activity within patient's tolerance;Monitored during session;Premedicated before session;Repositioned;Other (comment)(polar care applied and activated)  Vitals (HR and O2 on room air) stable and WFL throughout treatment session.  Home Living                      Prior Function            PT Goals (current goals can now be found in the care plan section) Acute Rehab PT Goals Patient Stated Goal: to go home PT Goal Formulation: With patient Time For Goal Achievement: 10/06/19 Potential to Achieve Goals: Good Progress towards PT goals: Progressing toward  goals    Frequency    BID      PT Plan Current plan remains appropriate    Co-evaluation              AM-PAC PT "6 Clicks" Mobility   Outcome Measure  Help needed turning from your back to your side while in a flat bed without using bedrails?: A Little Help needed moving from lying on your back to sitting on the side of a flat bed without using bedrails?: A Little Help needed moving to and from a bed to a chair (including a wheelchair)?: A Little Help needed standing up from a chair using your arms (e.g., wheelchair or bedside chair)?: A Little Help needed to walk in hospital room?: A Little Help needed climbing 3-5 steps with a railing? : A Little 6 Click Score: 18    End of Session Equipment Utilized During Treatment: Gait belt Activity Tolerance: Patient tolerated treatment well Patient left: in bed;with call bell/phone within reach;with bed alarm set;with SCD's reapplied;Other (comment)(B heels floating via towel rolls; polar care in place and activated) Nurse Communication: Mobility status;Precautions;Weight bearing status(via white board) PT Visit Diagnosis: Other abnormalities of gait and mobility (R26.89);Muscle weakness (generalized) (M62.81);Pain Pain - Right/Left: Left Pain - part of body: Knee     Time: [id number] PT Time Calculation (min) (ACUTE ONLY): 49 min  Charges:  $Gait Training: 8-22 mins $Therapeutic Exercise: 8-22 mins $Therapeutic Activity: 8-22 mins                     Leitha Bleak, PT 09/23/19, 2:39 PM

## 2019-09-23 NOTE — Discharge Instructions (Signed)

## 2019-09-23 NOTE — Progress Notes (Signed)
  Subjective: 1 Day Post-Op Procedure(s) (LRB): TOTAL KNEE ARTHROPLASTY (Left) Patient reports pain as 4 on 0-10 scale.   Patient is well, and has had no acute complaints or problems Plan is to go Home after hospital stay. Negative for chest pain and shortness of breath Fever: no Gastrointestinal:Negative for nausea and vomiting  Objective: Vital signs in last 24 hours: Temp:  [97.3 F (36.3 C)-98.8 F (37.1 C)] 98.8 F (37.1 C) (03/03 0328) Pulse Rate:  [59-71] 68 (03/03 0328) Resp:  [11-18] 18 (03/03 0328) BP: (83-128)/(50-72) 102/55 (03/03 0328) SpO2:  [92 %-99 %] 92 % (03/03 0328) FiO2 (%):  [21 %] 21 % (03/02 1241) Weight:  [86.6 kg] 86.6 kg (03/02 1507)  Intake/Output from previous day:  Intake/Output Summary (Last 24 hours) at 09/23/2019 0755 Last data filed at 09/23/2019 0359 Gross per 24 hour  Intake 1720 ml  Output 1935 ml  Net -215 ml    Intake/Output this shift: No intake/output data recorded.  Labs: No results for input(s): HGB in the last 72 hours. No results for input(s): WBC, RBC, HCT, PLT in the last 72 hours. No results for input(s): NA, K, CL, CO2, BUN, CREATININE, GLUCOSE, CALCIUM in the last 72 hours. No results for input(s): LABPT, INR in the last 72 hours.   EXAM General - Patient is Alert, Appropriate and Oriented Extremity - Neurovascular intact Sensation intact distally Intact pulses distally Dorsiflexion/Plantar flexion intact Incision: scant drainage No cellulitis present Dressing/Incision - blood tinged drainage, mild to the distal aspect of the incision. Motor Function - intact, moving foot and toes well on exam.  Abdomen with distention and mild tympany.  Normal bowel sounds can be auscultated.  Past Medical History:  Diagnosis Date  . Anemia   . Arthritis    osteo  . Blindness of right eye at birth  . Cancer [facility]) 2011   Left breast 2011 and cervical age 55  . Carpal tunnel syndrome   . Cataract   . Depression   . Edema  07/27/2011  . Fibromyalgia    muscle weakness and pain  . GERD (gastroesophageal reflux disease)   . Hyperlipidemia   . Hypertension    benign  . Lymphedema   . Osteoporosis   . Plantar fasciitis   . Pre-diabetes   . Raynaud's disease   . Restless leg syndrome   . Rosacea   . Rotator cuff rupture   . Sleep apnea   . Synovitis   . Ulcer     Assessment/Plan: 1 Day Post-Op Procedure(s) (LRB): TOTAL KNEE ARTHROPLASTY (Left) Active Problems:   Status post total knee replacement using cement, left  Estimated body mass index is 33.82 kg/m as calculated from the following:   Height as of this encounter: 5\' 3"  (1.6 m).   Weight as of this encounter: 86.6 kg. Advance diet Up with therapy D/C IV fluids when tolerating po intake.  Patient with abdominal distention this AM.  Reports she is passing gas.  Gentle increase in PO intake today. Begin working on BM. Up with therapy today. Plan for d/c home tomorrow pending progress on PT today.  DVT Prophylaxis - Lovenox, Foot Pumps and TED hose Weight-Bearing as tolerated to left leg  J. Cameron Proud, PA-C [facility] 09/23/2019, 7:55 AM

## 2019-09-23 NOTE — TOC Benefit Eligibility Note (Signed)
Transition of Care [facility]) Benefit Eligibility Note    Patient Details  Name: Krystal [name] MRN: [id number] Date of Birth: 1950/12/06   Medication/Dose: Enoxaparin 47m once daily for 14 days  Covered?: Yes  Tier: Other(Tier 4)  Prescription Coverage Preferred Pharmacy: [facility]  Spoke with Person/Company/Phone Number:: MVerdis Fredericksonwith [employer] at [phone] Co-Pay: $95.00 estimated copay  Prior Approval: No  Deductible: Met($445 deductible met.  In initial coverage stage per rep.)    HDannette BarbaraPhone Number: [phone] [phone]/09/2019, 9:50 AM

## 2019-09-23 NOTE — Progress Notes (Signed)
Physical Therapy Treatment Patient Details Name: Krystal [name] MRN: [id number] DOB: Oct 20, 1950 Today's Date: 09/23/2019    History of Present Illness Pt is a 69 y.o. female s/p L TKA secondary DJD 09/22/19.  PMH includes R TKA 07/2018, sleep apnea, htn, fibromyalgia, anemia, blindness R eye, CTS, Raynaud's disease, rotator cuff rupture.    PT Comments    Pt resting in bed upon PT arrival and reporting wanting to go to the bathroom (pt ambulated to bathroom with RW to toilet).  CGA with transfers and ambulation up to 60 feet with RW; vc's given to improve gait mechanics.  Pain 8.5/10 L knee beginning of session and 7/10 end of session at rest.  L knee flexion AROM to 85 degrees; L knee extension AROM 10 degrees short of neutral (pt educated on importance of positioning and improving L knee extension ROM: pt verbalizing appropriate understanding).  Will continue to focus on ROM, strengthening, and progressive functional mobility per pt tolerance.    Follow Up Recommendations  Home health PT     Equipment Recommendations  Rolling walker with 5" wheels;3in1 (PT)    Recommendations for Other Services       Precautions / Restrictions Precautions Precautions: Fall;Knee Precaution Booklet Issued: Yes (comment) Restrictions Weight Bearing Restrictions: Yes LLE Weight Bearing: Weight bearing as tolerated    Mobility  Bed Mobility Overal bed mobility: Needs Assistance Bed Mobility: Supine to Sit     Supine to sit: Supervision;HOB elevated     General bed mobility comments: mild increased effort to perform on own; used UE's to assist with moving L LE towards edge of bed  Transfers Overall transfer level: Needs assistance Equipment used: Rolling walker (2 wheeled) Transfers: Sit to/from Stand Sit to Stand: Min guard         General transfer comment: occasional vc's for UE/LE placement; mild increased effort to stand from bed and BSC (over toilet) up to  RW  Ambulation/Gait Ambulation/Gait assistance: Min guard Gait Distance (Feet): (20 feet (to bathroom); 60 feet) Assistive device: Rolling walker (2 wheeled)   Gait velocity: decreased   General Gait Details: mild decreased stance time L LE; vc's for relaxing L knee (increasing knee flexion during swing phase) and then for L heel strike; vc's for positioning within walker; steady   Chief Strategy Officer    Modified Rankin (Stroke Patients Only)       Balance Overall balance assessment: Needs assistance Sitting-balance support: No upper extremity supported;Feet supported   Sitting balance - Comments: steady sitting reaching outside BOS   Standing balance support: No upper extremity supported Standing balance-Leahy Scale: Good Standing balance comment: steady standing washing hands at sink                            Cognition Arousal/Alertness: Awake/alert Behavior During Therapy: WFL for tasks assessed/performed Overall Cognitive Status: Within Functional Limits for tasks assessed                                        Exercises Total Joint Exercises Long Arc Quad: AAROM;Strengthening;Left;10 reps;Seated Knee Flexion: AROM;Strengthening;Left;10 reps;Seated Goniometric ROM: L knee AROM extension 10 degrees short of neutral semi-supine in bed; L knee flexion AROM to 85 degrees sitting edge of recliner (limited d/t L knee pain)  General Comments   Nursing cleared pt for participation in physical therapy.  Pt agreeable to PT session.  Pt reporting not sleeping well last night and CPM was uncomfortable.      Pertinent Vitals/Pain Pain Assessment: 0-10 Pain Score: 7  Pain Location: L knee Pain Descriptors / Indicators: Aching;Sore Pain Intervention(s): Limited activity within patient's tolerance;Monitored during session;Premedicated before session;Repositioned;Other (comment)(polar care applied and activated)  Vitals  (HR and O2 on room air) stable and WFL throughout treatment session.    Home Living                      Prior Function            PT Goals (current goals can now be found in the care plan section) Acute Rehab PT Goals Patient Stated Goal: to go home PT Goal Formulation: With patient Time For Goal Achievement: 10/06/19 Potential to Achieve Goals: Good Progress towards PT goals: Progressing toward goals    Frequency    BID      PT Plan Current plan remains appropriate    Co-evaluation              AM-PAC PT "6 Clicks" Mobility   Outcome Measure  Help needed turning from your back to your side while in a flat bed without using bedrails?: A Little Help needed moving from lying on your back to sitting on the side of a flat bed without using bedrails?: A Little Help needed moving to and from a bed to a chair (including a wheelchair)?: A Little Help needed standing up from a chair using your arms (e.g., wheelchair or bedside chair)?: A Little Help needed to walk in hospital room?: A Little Help needed climbing 3-5 steps with a railing? : A Little 6 Click Score: 18    End of Session Equipment Utilized During Treatment: Gait belt Activity Tolerance: Patient tolerated treatment well Patient left: in chair;with call bell/phone within reach;with chair alarm set;with SCD's reapplied;Other (comment)(B heels floating via towel rolls; polar care in place and activated) Nurse Communication: Mobility status;Precautions;Weight bearing status PT Visit Diagnosis: Other abnormalities of gait and mobility (R26.89);Muscle weakness (generalized) (M62.81);Pain Pain - Right/Left: Left Pain - part of body: Knee     Time: [id number] PT Time Calculation (min) (ACUTE ONLY): 38 min  Charges:  $Gait Training: 8-22 mins $Therapeutic Exercise: 8-22 mins $Therapeutic Activity: 8-22 mins                     Leitha Bleak, PT 09/23/19, 1:00 PM

## 2019-09-23 NOTE — Discharge Summary (Signed)
Physician Discharge Summary  Patient ID: Krystal [name] MRN: [id number] DOB/AGE: [birth year] 69 y.o.  Admit date: 09/22/2019 Discharge date: 09/24/2019  Admission Diagnoses:  Status post total knee replacement using cement, left [Z96.652]  Discharge Diagnoses: Patient Active Problem List   Diagnosis Date Noted  . Status post total knee replacement using cement, left 09/22/2019  . Status post total knee replacement using cement, right 08/12/2018  . Rotator cuff rupture   . Acquired absence of bilateral breasts and nipples 09/17/2012  . Edema 07/27/2011  . Breast cancer of lower-outer quadrant of left female breast ([place]) 01/16/2011    Past Medical History:  Diagnosis Date  . Anemia   . Arthritis    osteo  . Blindness of right eye at birth  . Cancer [facility]) 2011   Left breast 2011 and cervical age 68  . Carpal tunnel syndrome   . Cataract   . Depression   . Edema 07/27/2011  . Fibromyalgia    muscle weakness and pain  . GERD (gastroesophageal reflux disease)   . Hyperlipidemia   . Hypertension    benign  . Lymphedema   . Osteoporosis   . Plantar fasciitis   . Pre-diabetes   . Raynaud's disease   . Restless leg syndrome   . Rosacea   . Rotator cuff rupture   . Sleep apnea   . Synovitis   . Ulcer      Transfusion: None.   Consultants (if any):   Discharged Condition: Improved  Hospital Course: Krystal [name] is an 69 y.o. female who was admitted 09/22/2019 with a diagnosis of left knee osteoarthritis and went to the operating room on 09/22/2019 and underwent the above named procedures.    Surgeries: Procedure(s): TOTAL KNEE ARTHROPLASTY on 09/22/2019 Patient tolerated the surgery well. Taken to PACU where she was stabilized and then transferred to the orthopedic floor.  Started on Lovenox 40mg  q 24 hrs. Foot pumps applied bilaterally at 80 mm. Heels elevated on bed with rolled towels. No evidence of DVT. Negative [place]. Physical therapy started on day #1 for gait  training and transfer. OT started day #1 for ADL and assisted devices.  Patient's IV was removed on POD2, Foley removed on POD1.  Implants: Left TKA using all-cemented [employer] Vanguard system with a 62.5 mm PCR femur, a 67 mm tibial tray with a 12 mm anterior stabilized E-poly insert, and a 34 x 8.5 mm all-poly 3-pegged domed patella.  She was given perioperative antibiotics:  Anti-infectives (From admission, onward)   Start     Dose/Rate Route Frequency Ordered Stop   09/22/19 1500  clindamycin (CLEOCIN) IVPB 600 mg     600 mg 100 mL/hr over 30 Minutes Intravenous Every 6 hours 09/22/19 1240 09/23/19 0426   09/22/19 0709  clindamycin (CLEOCIN) 900 MG/50ML IVPB    Note to Pharmacy: Lyman Bishop   : cabinet override      09/22/19 0709 09/22/19 0859   09/22/19 0600  clindamycin (CLEOCIN) IVPB 900 mg     900 mg 100 mL/hr over 30 Minutes Intravenous On call to O.R. 09/21/19 2137 09/22/19 [id number]    .  She was given sequential compression devices, early ambulation, and Lovenox for DVT prophylaxis.  She benefited maximally from the hospital stay and there were no complications.    Recent vital signs:  Vitals:   09/23/19 1606 09/23/19 2315  BP: 103/61 (!) 102/51  Pulse: 68 83  Resp: 18   Temp: 98.4 F (36.9 C) (!) 100.4  F (38 C)  SpO2: 94% 91%    Recent laboratory studies:  Lab Results  Component Value Date   HGB 12.0 09/24/2019   HGB 13.0 09/23/2019   HGB 14.7 09/15/2019   Lab Results  Component Value Date   WBC 12.3 (H) 09/24/2019   PLT 171 09/24/2019   Lab Results  Component Value Date   INR 0.97 08/06/2018   Lab Results  Component Value Date   NA 132 (L) 09/24/2019   K 3.7 09/24/2019   CL 95 (L) 09/24/2019   CO2 29 09/24/2019   BUN 14 09/24/2019   CREATININE 1.21 (H) 09/24/2019   GLUCOSE 121 (H) 09/24/2019    Discharge Medications:   Allergies as of 09/24/2019      Reactions   Cephalexin Nausea Only   Diaphoresis / Sweating (intolerance)   Levaquin  [levofloxacin] Other (See Comments)   Stiff joints, unable to walk.      Medication List    STOP taking these medications   meloxicam 7.5 MG tablet Commonly known as: MOBIC     TAKE these medications   alendronate 70 MG tablet Commonly known as: FOSAMAX Take 70 mg by mouth every Saturday. Take with a full glass of water on an empty stomach.   amLODipine 10 MG tablet Commonly known as: NORVASC Take 10 mg by mouth daily.   atenolol 100 MG tablet Commonly known as: TENORMIN Take 100 mg by mouth daily.   carisoprodol 350 MG tablet Commonly known as: SOMA Take 350 mg by mouth in the morning and at bedtime.   cholecalciferol 25 MCG (1000 UNIT) tablet Commonly known as: VITAMIN D3 Take 1,000 Units by mouth every evening.   COSAMIN DS PO Take 1 tablet by mouth 3 (three) times a week.   enoxaparin 40 MG/0.4ML injection Commonly known as: LOVENOX Inject 0.4 mLs (40 mg total) into the skin daily.   fluticasone 50 MCG/ACT nasal spray Commonly known as: FLONASE Place 1 spray into both nostrils daily.   furosemide 20 MG tablet Commonly known as: LASIX Take 20 mg by mouth 2 (two) times daily.   lisinopril-hydrochlorothiazide 20-25 MG tablet Commonly known as: ZESTORETIC Take 1 tablet by mouth every other day.   LYSINE ACETATE PO Take 4,000 mg by mouth daily as needed (cold sores).   magnesium oxide 400 MG tablet Commonly known as: MAG-OX Take 400 mg by mouth daily as needed (leg cramps).   omeprazole 20 MG capsule Commonly known as: PRILOSEC Take 20 mg by mouth daily.   oxyCODONE 5 MG immediate release tablet Commonly known as: Oxy IR/ROXICODONE Take 1-2 tablets (5-10 mg total) by mouth every 4 (four) hours as needed for moderate pain.   potassium chloride 10 MEQ tablet Commonly known as: KLOR-CON Take 10 mEq by mouth in the morning and at bedtime.   pregabalin 300 MG capsule Commonly known as: LYRICA Take 300 mg by mouth 2 (two) times daily.   rOPINIRole 1  MG tablet Commonly known as: REQUIP Take 1 mg by mouth at bedtime.   traMADol 50 MG tablet Commonly known as: ULTRAM Take 1-2 tablets (50-100 mg total) by mouth every 6 (six) hours as needed for moderate pain.   triamcinolone 0.025 % cream Commonly known as: KENALOG Apply 1 application topically daily as needed (rash).   zolpidem 10 MG tablet Commonly known as: AMBIEN Take 10 mg by mouth at bedtime.            Durable Medical Equipment  (From admission, onward)  Start     Ordered   09/22/19 1241  DME Bedside commode  Once    Question:  Patient needs a bedside commode to treat with the following condition  Answer:  Status post total knee replacement using cement, left   09/22/19 1240   09/22/19 1241  DME 3 n 1  Once     09/22/19 1240   09/22/19 1241  DME Walker rolling  Once    Question Answer Comment  Walker: With 5 Inch Wheels   Patient needs a walker to treat with the following condition Status post total knee replacement using cement, left      09/22/19 1240          Diagnostic Studies: DG Knee Left Port  Result Date: 09/22/2019 CLINICAL DATA:  Status post total left knee replacement. EXAM: PORTABLE LEFT KNEE - 1-2 VIEW COMPARISON:  None. FINDINGS: The left knee demonstrates a total knee arthroplasty without evidence of hardware failure complication. There is no significant joint effusion. There is no fracture or dislocation. The alignment is anatomic. Post-surgical changes noted in the surrounding soft tissues. IMPRESSION: Interval left total knee arthroplasty. Electronically Signed   By: Kathreen Devoid   On: 09/22/2019 12:08   Disposition: Discharge disposition: 01-Home or [facility] for d/c home with HHPT on 09/24/19  Follow-up Information    Lattie Corns, PA-C Follow up in 14 day(s).   Specialty: Physician Assistant Why: Staple Removal and skin check. Contact information: [place]  [address] [phone]            Signed: Prescott Parma, Zelphia Glover PA-C 09/24/2019, 7:03 AM

## 2019-09-23 NOTE — Anesthesia Postprocedure Evaluation (Signed)
Anesthesia Post Note  Patient: Krystal [name]  Procedure(s) Performed: TOTAL KNEE ARTHROPLASTY (Left Knee)  Patient location during evaluation: Nursing Unit Anesthesia Type: Spinal Level of consciousness: awake, awake and alert and oriented Pain management: pain level controlled Vital Signs Assessment: post-procedure vital signs reviewed and stable Respiratory status: spontaneous breathing, nonlabored ventilation and respiratory function stable Cardiovascular status: blood pressure returned to baseline and stable Postop Assessment: no headache and no backache Anesthetic complications: no     Last Vitals:  Vitals:   09/23/19 0016 09/23/19 0328  BP: (!) 111/58 (!) 102/55  Pulse: 70 68  Resp: 16 18  Temp: 36.7 C 37.1 C  SpO2: 95% 92%    Last Pain:  Vitals:   09/23/19 0554  TempSrc:   PainSc: 2                  Johnna Acosta

## 2019-09-24 ENCOUNTER — Inpatient Hospital Stay: Payer: Medicare HMO

## 2019-09-24 LAB — CBC
HCT: 35.3 % — ABNORMAL LOW (ref 36.0–46.0)
Hemoglobin: 12 g/dL (ref 12.0–15.0)
MCH: 34.4 pg — ABNORMAL HIGH (ref 26.0–34.0)
MCHC: 34 g/dL (ref 30.0–36.0)
MCV: 101.1 fL — ABNORMAL HIGH (ref 80.0–100.0)
Platelets: 171 10*3/uL (ref 150–400)
RBC: 3.49 MIL/uL — ABNORMAL LOW (ref 3.87–5.11)
RDW: 15.4 % (ref 11.5–15.5)
WBC: 12.3 10*3/uL — ABNORMAL HIGH (ref 4.0–10.5)
nRBC: 0 % (ref 0.0–0.2)

## 2019-09-24 LAB — URINALYSIS, ROUTINE W REFLEX MICROSCOPIC
Bilirubin Urine: NEGATIVE
Glucose, UA: NEGATIVE mg/dL
Hgb urine dipstick: NEGATIVE
Ketones, ur: NEGATIVE mg/dL
Nitrite: POSITIVE — AB
Protein, ur: NEGATIVE mg/dL
Specific Gravity, Urine: 1.01 (ref 1.005–1.030)
pH: 6 (ref 5.0–8.0)

## 2019-09-24 LAB — BASIC METABOLIC PANEL
Anion gap: 8 (ref 5–15)
BUN: 14 mg/dL (ref 8–23)
CO2: 29 mmol/L (ref 22–32)
Calcium: 8.3 mg/dL — ABNORMAL LOW (ref 8.9–10.3)
Chloride: 95 mmol/L — ABNORMAL LOW (ref 98–111)
Creatinine, Ser: 1.21 mg/dL — ABNORMAL HIGH (ref 0.44–1.00)
GFR calc Af Amer: 54 mL/min — ABNORMAL LOW (ref >60)
GFR calc non Af Amer: 47 mL/min — ABNORMAL LOW (ref >60)
Glucose, Bld: 121 mg/dL — ABNORMAL HIGH (ref 70–99)
Potassium: 3.7 mmol/L (ref 3.5–5.1)
Sodium: 132 mmol/L — ABNORMAL LOW (ref 135–145)

## 2019-09-24 MED ORDER — SULFAMETHOXAZOLE-TRIMETHOPRIM 800-160 MG PO TABS
1.0000 | ORAL_TABLET | Freq: Two times a day (BID) | ORAL | Status: DC
  Filled 2019-09-24: qty 1

## 2019-09-24 MED ORDER — SULFAMETHOXAZOLE-TRIMETHOPRIM 800-160 MG PO TABS: 1.0000 | ORAL_TABLET | Freq: Two times a day (BID) | ORAL | 0 refills | Status: DC

## 2019-09-24 NOTE — Progress Notes (Addendum)
DISCHARGE NOTE:  Pt given discharge instructions and scripts. Pt verbalized understanding. Pt requesting to take PO Bactrim at home. Pts temp 100.2, MD Poggi made aware of the above, per MD pt ok to discharge. Surgical dressing changed, extra honeycomb sent, TED hose on both legs. Pt wheeled to car by staff. Husband providing transportation.

## 2019-09-24 NOTE — Progress Notes (Signed)
  Subjective: 2 Days Post-Op Procedure(s) (LRB): TOTAL KNEE ARTHROPLASTY (Left) Patient reports pain as 4 on 0-10 scale.   Patient is well, and has had no acute complaints or problems Plan is to go Home after hospital stay. Negative for chest pain and shortness of breath Fever: no Gastrointestinal:Negative for nausea and vomiting  Objective: Vital signs in last 24 hours: Temp:  [97.9 F (36.6 C)-100.4 F (38 C)] 100.4 F (38 C) (03/03 2315) Pulse Rate:  [68-83] 83 (03/03 2315) Resp:  [16-18] 18 (03/03 1606) BP: (94-110)/(43-61) 102/51 (03/03 2315) SpO2:  [91 %-95 %] 91 % (03/03 2315)  Intake/Output from previous day:  Intake/Output Summary (Last 24 hours) at 09/24/2019 0701 Last data filed at 09/23/2019 1837 Gross per 24 hour  Intake 1718.27 ml  Output --  Net 1718.27 ml    Intake/Output this shift: No intake/output data recorded.  Labs: Recent Labs    09/23/19 1103 09/24/19 0544  HGB 13.0 12.0   Recent Labs    09/23/19 1103 09/24/19 0544  WBC 9.1 12.3*  RBC 3.79* 3.49*  HCT 38.7 35.3*  PLT 182 171   Recent Labs    09/23/19 1103 09/24/19 0544  NA 132* 132*  K 3.8 3.7  CL 94* 95*  CO2 26 29  BUN 14 14  CREATININE 1.31* 1.21*  GLUCOSE 124* 121*  CALCIUM 8.3* 8.3*   No results for input(s): LABPT, INR in the last 72 hours.   EXAM General - Patient is Alert, Appropriate and Oriented Extremity - Neurovascular intact Sensation intact distally Intact pulses distally Dorsiflexion/Plantar flexion intact Incision: scant drainage No cellulitis present Dressing/Incision - blood tinged drainage, mild to the distal aspect of the incision.  The surgical Ace wrap was removed. Motor Function - intact, moving foot and toes well on exam.  Patient ambulated 200 feet.  Past Medical History:  Diagnosis Date  . Anemia   . Arthritis    osteo  . Blindness of right eye at birth  . Cancer [facility]) 2011   Left breast 2011 and cervical age 89  . Carpal tunnel syndrome    . Cataract   . Depression   . Edema 07/27/2011  . Fibromyalgia    muscle weakness and pain  . GERD (gastroesophageal reflux disease)   . Hyperlipidemia   . Hypertension    benign  . Lymphedema   . Osteoporosis   . Plantar fasciitis   . Pre-diabetes   . Raynaud's disease   . Restless leg syndrome   . Rosacea   . Rotator cuff rupture   . Sleep apnea   . Synovitis   . Ulcer     Assessment/Plan: 2 Days Post-Op Procedure(s) (LRB): TOTAL KNEE ARTHROPLASTY (Left) Active Problems:   Status post total knee replacement using cement, left  Estimated body mass index is 33.82 kg/m as calculated from the following:   Height as of this encounter: 5\' 3"  (1.6 m).   Weight as of this encounter: 86.6 kg. Advance diet Up with therapy D/C IV fluids when tolerating po intake.  Patient with abdominal distention this AM.  Reports she is passing gas.  Gentle increase in PO intake today. Continue working on BM. Up with therapy today. Plan for d/c home after PT today.  DVT Prophylaxis - Lovenox, Foot Pumps and TED hose Weight-Bearing as tolerated to left leg  Reche Dixon, PA-C [facility] Orthopaedic Surgery 09/24/2019, 7:01 AM

## 2019-09-24 NOTE — Progress Notes (Signed)
Physical Therapy Treatment Patient Details Name: Krystal [name] MRN: [id number] DOB: [birth year] Today's Date: 09/24/2019    History of Present Illness Pt is a 69 y.o. female s/p L TKA secondary DJD 09/22/19.  PMH includes R TKA 07/2018, sleep apnea, htn, fibromyalgia, anemia, blindness R eye, CTS, Raynaud's disease, rotator cuff rupture.    PT Comments    Pt resting in bed upon PT arrival.  Pt reporting improved pain this afternoon to 6/10 L knee.  Modified independent with bed mobility; SBA with transfers; and SBA with ambulation 200 feet with RW.  Improved mobility noted this afternoon d/t decreased L knee pain.  5/10 L knee pain end of session at rest.  Pt declining to trial stairs again and reports feeling comfortable with stairs technique.  Reviewed safe car transfer technique (pt verbalizing appropriate understanding).  Pt reports no further questions/concerns for discharge home this afternoon.    Follow Up Recommendations  Home health PT     Equipment Recommendations  Rolling walker with 5" wheels;3in1 (PT)    Recommendations for Other Services       Precautions / Restrictions Precautions Precautions: Fall;Knee Precaution Booklet Issued: Yes (comment) Restrictions Weight Bearing Restrictions: Yes LLE Weight Bearing: Weight bearing as tolerated    Mobility  Bed Mobility Overal bed mobility: Modified Independent Bed Mobility: Supine to Sit;Sit to Supine     Supine to sit: Modified independent (Device/Increase time);HOB elevated Sit to supine: Modified independent (Device/Increase time);HOB elevated   General bed mobility comments: pt able to perform on own (pt using B UE's to move L LE out/into bed)  Transfers Overall transfer level: Needs assistance Equipment used: Rolling walker (2 wheeled) Transfers: Sit to/from Stand Sit to Stand: Supervision         General transfer comment: fairly strong stand from bed x1 trial and from [facility] over toilet x1 trial; no vc's  required for technique  Ambulation/Gait Ambulation/Gait assistance: Supervision Gait Distance (Feet): (20 feet; 200 feet) Assistive device: Rolling walker (2 wheeled)   Gait velocity: decreased   General Gait Details: step to progressing to partial step through gait pattern; good L heelstrike; vc's to increase L knee flexion during L swing phase to avoid circumduction movement of L LE when advancing; steady with RW use   Stairs             Wheelchair Mobility    Modified Rankin (Stroke Patients Only)       Balance Overall balance assessment: Needs assistance Sitting-balance support: No upper extremity supported;Feet supported Sitting balance-Leahy Scale: Normal Sitting balance - Comments: steady sitting reaching outside BOS   Standing balance support: No upper extremity supported Standing balance-Leahy Scale: Good Standing balance comment: steady standing washing hands at sink                            Cognition Arousal/Alertness: Awake/alert Behavior During Therapy: WFL for tasks assessed/performed Overall Cognitive Status: Within Functional Limits for tasks assessed                                        Exercises      General Comments  Pt agreeable to PT session.      Pertinent Vitals/Pain Pain Assessment: 0-10 Pain Score: 5  Pain Location: L knee Pain Descriptors / Indicators: Aching;Sore;Burning Pain Intervention(s): Limited activity within patient's tolerance;Monitored during session;Premedicated before  session;Repositioned;Other (comment)(polar care applied and activated)  Vitals (HR and O2 on room air) stable and WFL throughout treatment session.    Home Living                      Prior Function            PT Goals (current goals can now be found in the care plan section) Acute Rehab PT Goals Patient Stated Goal: to go home PT Goal Formulation: With patient Time For Goal Achievement:  10/06/19 Potential to Achieve Goals: Good Progress towards PT goals: Progressing toward goals    Frequency    BID      PT Plan Current plan remains appropriate    Co-evaluation              AM-PAC PT "6 Clicks" Mobility   Outcome Measure  Help needed turning from your back to your side while in a flat bed without using bedrails?: None Help needed moving from lying on your back to sitting on the side of a flat bed without using bedrails?: A Little Help needed moving to and from a bed to a chair (including a wheelchair)?: A Little Help needed standing up from a chair using your arms (e.g., wheelchair or bedside chair)?: A Little Help needed to walk in hospital room?: A Little Help needed climbing 3-5 steps with a railing? : A Little 6 Click Score: 19    End of Session Equipment Utilized During Treatment: Gait belt Activity Tolerance: Patient tolerated treatment well Patient left: in bed;with call bell/phone within reach;with bed alarm set;with SCD's reapplied;Other (comment)(L heel floating via towel roll; polar care in place and activated) Nurse Communication: Mobility status;Precautions;Weight bearing status PT Visit Diagnosis: Other abnormalities of gait and mobility (R26.89);Muscle weakness (generalized) (M62.81);Pain Pain - Right/Left: Left Pain - part of body: Knee     Time: [id number] PT Time Calculation (min) (ACUTE ONLY): 32 min  Charges:  $Gait Training: 8-22 mins $Therapeutic Activity: 8-22 mins                     Leitha Bleak, PT 09/24/19, 5:16 PM

## 2019-09-24 NOTE — Progress Notes (Signed)
Physical Therapy Treatment Patient Details Name: Krystal [name] MRN: [id number] DOB: [birth year] Today's Date: 09/24/2019    History of Present Illness Pt is a 69 y.o. female s/p L TKA secondary DJD 09/22/19.  PMH includes R TKA 07/2018, sleep apnea, htn, fibromyalgia, anemia, blindness R eye, CTS, Raynaud's disease, rotator cuff rupture.    PT Comments    Attempted PT session earlier this morning but pt reporting having too much L knee pain and needing pain medication to take effect first so therapist returned later in morning for PT session.  Upon arriving to pt's room 2nd time, pt reporting she would try to do what she could but still was having a lot of L knee pain and reports she thinks it is from that "machine" (CPM) that she was in for 8 hours last night and was not comfortable for her L knee.  Min assist for L LE to bring off of bed d/t significant L knee pain.  Increased effort/time required for transfers but pt able to perform on own with RW use.  Pt ambulated to bathroom (used BSC over toilet) and pt reporting having small bowel movement.  Then pt ambulated 40 feet with RW before needing to stop to rest (sitting in recliner) d/t reports of significant L knee pain.  Pt rested and then requesting to trial stairs.  Able to navigate 4 stairs with UE support safely after initial cueing (no knee buckling noted).  L knee flexion limited to 80 degrees AROM d/t significant L knee pain.  Will continue to focus on strengthening, ROM, and progressive functional mobility during hospitalization.   Follow Up Recommendations  Home health PT;Supervision for mobility/OOB     Equipment Recommendations  Rolling walker with 5" wheels;3in1 (PT)    Recommendations for Other Services       Precautions / Restrictions Precautions Precautions: Fall;Knee Precaution Booklet Issued: Yes (comment) Restrictions Weight Bearing Restrictions: Yes LLE Weight Bearing: Weight bearing as tolerated    Mobility  Bed  Mobility   Bed Mobility: Supine to Sit     Supine to sit: Min assist(bed flat)     General bed mobility comments: increased time to perform on own d/t L knee pain; assist at very end (when bringing L LE off bed) d/t increased L knee pain (otherwise no assist required)  Transfers Overall transfer level: Needs assistance Equipment used: Rolling walker (2 wheeled) Transfers: Sit to/from Stand Sit to Stand: Supervision         General transfer comment: fairly strong stand from bed x1 trial, from [facility] over toilet x1 trial, and from recliner x1 trial (increased time to stand though d/t L knee pain)  Ambulation/Gait Ambulation/Gait assistance: Min guard;Supervision Gait Distance (Feet): (20 feet to bathroom; 40 feet) Assistive device: Rolling walker (2 wheeled)   Gait velocity: decreased   General Gait Details: step to gait pattern; initial vc's for stepping pattern; antalgic; decreased stance time L LE; limited distance ambulating d/t L knee pain   Stairs Stairs: Yes Stairs assistance: Min guard Stair Management: One rail Right;Step to pattern(forwards with B railings; partially turned with single railing) Number of Stairs: 4 General stair comments: ascended 1 step with B railings and descended 1 step with RW; ascended/descended 3 steps with R railing; initial vc's for L quad set when advancing R LE and UE/LE placement and overall technique   Wheelchair Mobility    Modified Rankin (Stroke Patients Only)       Balance Overall balance assessment: Needs assistance Sitting-balance  support: No upper extremity supported;Feet supported Sitting balance-Leahy Scale: Good Sitting balance - Comments: steady sitting reaching outside BOS   Standing balance support: No upper extremity supported Standing balance-Leahy Scale: Good Standing balance comment: steady standing washing hands at sink                            Cognition Arousal/Alertness: Awake/alert Behavior  During Therapy: WFL for tasks assessed/performed Overall Cognitive Status: Within Functional Limits for tasks assessed                                        Exercises Total Joint Exercises Quad Sets: AROM;Strengthening;Both;10 reps;Supine Heel Slides: AAROM;Strengthening;Left;10 reps;Supine Straight Leg Raises: AAROM;Strengthening;Left;10 reps;Supine Goniometric ROM: L knee AROM extension 5 degrees short of neutral semi-supine in bed; L knee flexion AROM to 80 degrees sitting edge of recliner (limited d/t L knee pain)    General Comments   Nursing cleared pt for participation in physical therapy.  Pt agreeable to PT session.      Pertinent Vitals/Pain Pain Assessment: 0-10 Pain Score: 7  Pain Location: L knee Pain Descriptors / Indicators: Aching;Sore;Burning Pain Intervention(s): Limited activity within patient's tolerance;Monitored during session;Premedicated before session;Repositioned;Other (comment)(polar care applied and activated)  Vitals stable and WFL throughout treatment session (HR and O2 on room air).    Home Living                      Prior Function            PT Goals (current goals can now be found in the care plan section) Acute Rehab PT Goals Patient Stated Goal: to go home PT Goal Formulation: With patient Time For Goal Achievement: 10/06/19 Potential to Achieve Goals: Good Progress towards PT goals: Progressing toward goals    Frequency    BID      PT Plan Current plan remains appropriate    Co-evaluation              AM-PAC PT "6 Clicks" Mobility   Outcome Measure  Help needed turning from your back to your side while in a flat bed without using bedrails?: A Little Help needed moving from lying on your back to sitting on the side of a flat bed without using bedrails?: A Little Help needed moving to and from a bed to a chair (including a wheelchair)?: A Little Help needed standing up from a chair using your  arms (e.g., wheelchair or bedside chair)?: A Little Help needed to walk in hospital room?: A Little Help needed climbing 3-5 steps with a railing? : A Little 6 Click Score: 18    End of Session Equipment Utilized During Treatment: Gait belt Activity Tolerance: Patient limited by pain Patient left: in chair;with call bell/phone within reach;with chair alarm set;with SCD's reapplied;Other (comment)(L heel floating via towel roll; polar care in place and activated) Nurse Communication: Mobility status;Precautions;Weight bearing status;Other (comment)(pt's pain status) PT Visit Diagnosis: Other abnormalities of gait and mobility (R26.89);Muscle weakness (generalized) (M62.81);Pain Pain - Right/Left: Left Pain - part of body: Knee     Time: [id number] PT Time Calculation (min) (ACUTE ONLY): 68 min  Charges:  $Gait Training: 23-37 mins $Therapeutic Exercise: 8-22 mins $Therapeutic Activity: 23-37 mins  Leitha Bleak, PT 09/24/19, 12:38 PM

## 2019-09-24 NOTE — TOC Transition Note (Signed)
Transition of Care [facility]) - CM/SW Discharge Note   Patient Details  Name: Krystal [name] MRN: [id number] Date of Birth: 08/24/1950  [facility] CM/SW Contact:  Su Hilt, RN Phone Number: 09/24/2019, 8:49 AM   Clinical Narrative:    The patient to DC home with her husband today  She is set up with [facility] PT She has all the DME she needs at home and does not have additional needs She is familiar with Lovenox and will do the injection this morning on her own with guidance from the nurse NO additional needs  Final next level of care: [place] Barriers to Discharge: Barriers Resolved   Patient Goals and CMS Choice Patient states their goals for this hospitalization and ongoing recovery are:: Go Home      Discharge Placement                       Discharge Plan and Services   Discharge Planning Services: CM Consult            DME Arranged: N/A   Date DME Agency Contacted: 09/23/19 Time DME Agency Contacted: [place] Arranged: PT [place] Agency: [facility] [employer] [facility] [employer] Date [place]: 09/23/19 Time [place]: [place] Representative spoke with at [place]: [place] ([place]) Interventions     Readmission Risk Interventions No flowsheet data found.

## 2019-09-25 DIAGNOSIS — M810 Age-related osteoporosis without current pathological fracture: Secondary | ICD-10-CM | POA: Diagnosis not present

## 2019-09-25 DIAGNOSIS — I10 Essential (primary) hypertension: Secondary | ICD-10-CM | POA: Diagnosis not present

## 2019-09-25 DIAGNOSIS — Z471 Aftercare following joint replacement surgery: Secondary | ICD-10-CM | POA: Diagnosis not present

## 2019-09-25 DIAGNOSIS — H269 Unspecified cataract: Secondary | ICD-10-CM | POA: Diagnosis not present

## 2019-09-25 DIAGNOSIS — F329 Major depressive disorder, single episode, unspecified: Secondary | ICD-10-CM | POA: Diagnosis not present

## 2019-09-25 DIAGNOSIS — E785 Hyperlipidemia, unspecified: Secondary | ICD-10-CM | POA: Diagnosis not present

## 2019-09-25 DIAGNOSIS — M797 Fibromyalgia: Secondary | ICD-10-CM | POA: Diagnosis not present

## 2019-09-25 DIAGNOSIS — G2581 Restless legs syndrome: Secondary | ICD-10-CM | POA: Diagnosis not present

## 2019-09-25 DIAGNOSIS — I7300 Raynaud's syndrome without gangrene: Secondary | ICD-10-CM | POA: Diagnosis not present

## 2019-09-28 DIAGNOSIS — E785 Hyperlipidemia, unspecified: Secondary | ICD-10-CM | POA: Diagnosis not present

## 2019-09-28 DIAGNOSIS — H269 Unspecified cataract: Secondary | ICD-10-CM | POA: Diagnosis not present

## 2019-09-28 DIAGNOSIS — M797 Fibromyalgia: Secondary | ICD-10-CM | POA: Diagnosis not present

## 2019-09-28 DIAGNOSIS — I10 Essential (primary) hypertension: Secondary | ICD-10-CM | POA: Diagnosis not present

## 2019-09-28 DIAGNOSIS — M810 Age-related osteoporosis without current pathological fracture: Secondary | ICD-10-CM | POA: Diagnosis not present

## 2019-09-28 DIAGNOSIS — Z471 Aftercare following joint replacement surgery: Secondary | ICD-10-CM | POA: Diagnosis not present

## 2019-09-28 DIAGNOSIS — G2581 Restless legs syndrome: Secondary | ICD-10-CM | POA: Diagnosis not present

## 2019-09-28 DIAGNOSIS — I7300 Raynaud's syndrome without gangrene: Secondary | ICD-10-CM | POA: Diagnosis not present

## 2019-09-28 DIAGNOSIS — F329 Major depressive disorder, single episode, unspecified: Secondary | ICD-10-CM | POA: Diagnosis not present

## 2019-09-30 DIAGNOSIS — M797 Fibromyalgia: Secondary | ICD-10-CM | POA: Diagnosis not present

## 2019-09-30 DIAGNOSIS — F329 Major depressive disorder, single episode, unspecified: Secondary | ICD-10-CM | POA: Diagnosis not present

## 2019-09-30 DIAGNOSIS — G2581 Restless legs syndrome: Secondary | ICD-10-CM | POA: Diagnosis not present

## 2019-09-30 DIAGNOSIS — M810 Age-related osteoporosis without current pathological fracture: Secondary | ICD-10-CM | POA: Diagnosis not present

## 2019-09-30 DIAGNOSIS — H269 Unspecified cataract: Secondary | ICD-10-CM | POA: Diagnosis not present

## 2019-09-30 DIAGNOSIS — I7300 Raynaud's syndrome without gangrene: Secondary | ICD-10-CM | POA: Diagnosis not present

## 2019-09-30 DIAGNOSIS — Z471 Aftercare following joint replacement surgery: Secondary | ICD-10-CM | POA: Diagnosis not present

## 2019-09-30 DIAGNOSIS — E785 Hyperlipidemia, unspecified: Secondary | ICD-10-CM | POA: Diagnosis not present

## 2019-09-30 DIAGNOSIS — I10 Essential (primary) hypertension: Secondary | ICD-10-CM | POA: Diagnosis not present

## 2019-10-02 DIAGNOSIS — M810 Age-related osteoporosis without current pathological fracture: Secondary | ICD-10-CM | POA: Diagnosis not present

## 2019-10-02 DIAGNOSIS — M797 Fibromyalgia: Secondary | ICD-10-CM | POA: Diagnosis not present

## 2019-10-02 DIAGNOSIS — I7300 Raynaud's syndrome without gangrene: Secondary | ICD-10-CM | POA: Diagnosis not present

## 2019-10-02 DIAGNOSIS — F329 Major depressive disorder, single episode, unspecified: Secondary | ICD-10-CM | POA: Diagnosis not present

## 2019-10-02 DIAGNOSIS — H269 Unspecified cataract: Secondary | ICD-10-CM | POA: Diagnosis not present

## 2019-10-02 DIAGNOSIS — I10 Essential (primary) hypertension: Secondary | ICD-10-CM | POA: Diagnosis not present

## 2019-10-02 DIAGNOSIS — E785 Hyperlipidemia, unspecified: Secondary | ICD-10-CM | POA: Diagnosis not present

## 2019-10-02 DIAGNOSIS — G2581 Restless legs syndrome: Secondary | ICD-10-CM | POA: Diagnosis not present

## 2019-10-02 DIAGNOSIS — Z471 Aftercare following joint replacement surgery: Secondary | ICD-10-CM | POA: Diagnosis not present

## 2019-10-05 DIAGNOSIS — I10 Essential (primary) hypertension: Secondary | ICD-10-CM | POA: Diagnosis not present

## 2019-10-05 DIAGNOSIS — F329 Major depressive disorder, single episode, unspecified: Secondary | ICD-10-CM | POA: Diagnosis not present

## 2019-10-05 DIAGNOSIS — M810 Age-related osteoporosis without current pathological fracture: Secondary | ICD-10-CM | POA: Diagnosis not present

## 2019-10-05 DIAGNOSIS — Z471 Aftercare following joint replacement surgery: Secondary | ICD-10-CM | POA: Diagnosis not present

## 2019-10-05 DIAGNOSIS — E785 Hyperlipidemia, unspecified: Secondary | ICD-10-CM | POA: Diagnosis not present

## 2019-10-05 DIAGNOSIS — H269 Unspecified cataract: Secondary | ICD-10-CM | POA: Diagnosis not present

## 2019-10-05 DIAGNOSIS — M797 Fibromyalgia: Secondary | ICD-10-CM | POA: Diagnosis not present

## 2019-10-05 DIAGNOSIS — G2581 Restless legs syndrome: Secondary | ICD-10-CM | POA: Diagnosis not present

## 2019-10-05 DIAGNOSIS — I7300 Raynaud's syndrome without gangrene: Secondary | ICD-10-CM | POA: Diagnosis not present

## 2019-10-07 DIAGNOSIS — Z96652 Presence of left artificial knee joint: Secondary | ICD-10-CM | POA: Diagnosis not present

## 2019-10-07 DIAGNOSIS — M25662 Stiffness of left knee, not elsewhere classified: Secondary | ICD-10-CM | POA: Diagnosis not present

## 2019-10-07 DIAGNOSIS — M25562 Pain in left knee: Secondary | ICD-10-CM | POA: Diagnosis not present

## 2019-10-07 DIAGNOSIS — R29898 Other symptoms and signs involving the musculoskeletal system: Secondary | ICD-10-CM | POA: Diagnosis not present

## 2019-10-12 DIAGNOSIS — M25562 Pain in left knee: Secondary | ICD-10-CM | POA: Diagnosis not present

## 2019-10-12 DIAGNOSIS — Z96652 Presence of left artificial knee joint: Secondary | ICD-10-CM | POA: Diagnosis not present

## 2019-10-14 DIAGNOSIS — Z96652 Presence of left artificial knee joint: Secondary | ICD-10-CM | POA: Diagnosis not present

## 2019-10-14 DIAGNOSIS — M25562 Pain in left knee: Secondary | ICD-10-CM | POA: Diagnosis not present

## 2019-10-19 DIAGNOSIS — Z96652 Presence of left artificial knee joint: Secondary | ICD-10-CM | POA: Diagnosis not present

## 2019-10-21 DIAGNOSIS — M25562 Pain in left knee: Secondary | ICD-10-CM | POA: Diagnosis not present

## 2019-10-21 DIAGNOSIS — Z96652 Presence of left artificial knee joint: Secondary | ICD-10-CM | POA: Diagnosis not present

## 2019-10-26 DIAGNOSIS — Z96652 Presence of left artificial knee joint: Secondary | ICD-10-CM | POA: Diagnosis not present

## 2019-10-26 DIAGNOSIS — M25562 Pain in left knee: Secondary | ICD-10-CM | POA: Diagnosis not present

## 2019-10-29 DIAGNOSIS — Z96652 Presence of left artificial knee joint: Secondary | ICD-10-CM | POA: Diagnosis not present

## 2019-10-29 DIAGNOSIS — M25562 Pain in left knee: Secondary | ICD-10-CM | POA: Diagnosis not present

## 2019-11-06 DIAGNOSIS — M1712 Unilateral primary osteoarthritis, left knee: Secondary | ICD-10-CM | POA: Diagnosis not present

## 2019-12-01 DIAGNOSIS — M5134 Other intervertebral disc degeneration, thoracic region: Secondary | ICD-10-CM | POA: Diagnosis not present

## 2019-12-01 DIAGNOSIS — M9902 Segmental and somatic dysfunction of thoracic region: Secondary | ICD-10-CM | POA: Diagnosis not present

## 2019-12-01 DIAGNOSIS — M9903 Segmental and somatic dysfunction of lumbar region: Secondary | ICD-10-CM | POA: Diagnosis not present

## 2019-12-01 DIAGNOSIS — M5136 Other intervertebral disc degeneration, lumbar region: Secondary | ICD-10-CM | POA: Diagnosis not present

## 2019-12-04 DIAGNOSIS — R5383 Other fatigue: Secondary | ICD-10-CM | POA: Diagnosis not present

## 2019-12-04 DIAGNOSIS — Z96652 Presence of left artificial knee joint: Secondary | ICD-10-CM | POA: Diagnosis not present

## 2019-12-04 DIAGNOSIS — M7062 Trochanteric bursitis, left hip: Secondary | ICD-10-CM | POA: Diagnosis not present

## 2019-12-16 DIAGNOSIS — H903 Sensorineural hearing loss, bilateral: Secondary | ICD-10-CM | POA: Diagnosis not present

## 2019-12-16 DIAGNOSIS — M26629 Arthralgia of temporomandibular joint, unspecified side: Secondary | ICD-10-CM | POA: Diagnosis not present

## 2019-12-16 DIAGNOSIS — H6123 Impacted cerumen, bilateral: Secondary | ICD-10-CM | POA: Diagnosis not present

## 2019-12-23 DIAGNOSIS — M7052 Other bursitis of knee, left knee: Secondary | ICD-10-CM | POA: Diagnosis not present

## 2019-12-23 DIAGNOSIS — Z96652 Presence of left artificial knee joint: Secondary | ICD-10-CM | POA: Diagnosis not present

## 2020-02-08 DIAGNOSIS — M7052 Other bursitis of knee, left knee: Secondary | ICD-10-CM | POA: Diagnosis not present

## 2020-02-08 DIAGNOSIS — Z96652 Presence of left artificial knee joint: Secondary | ICD-10-CM | POA: Diagnosis not present

## 2020-02-25 DIAGNOSIS — J0100 Acute maxillary sinusitis, unspecified: Secondary | ICD-10-CM | POA: Diagnosis not present

## 2020-02-25 DIAGNOSIS — R195 Other fecal abnormalities: Secondary | ICD-10-CM | POA: Diagnosis not present

## 2020-03-21 DIAGNOSIS — H2512 Age-related nuclear cataract, left eye: Secondary | ICD-10-CM | POA: Diagnosis not present

## 2020-03-25 ENCOUNTER — Telehealth: Payer: Self-pay | Admitting: Adult Health

## 2020-03-25 NOTE — Telephone Encounter (Signed)
Rescheduled appointment per 9/3 scheduling message. Patient is aware of appointment change.

## 2020-04-04 ENCOUNTER — Inpatient Hospital Stay: Payer: Medicare HMO | Admitting: Adult Health

## 2020-04-06 ENCOUNTER — Encounter: Payer: Self-pay | Admitting: Adult Health

## 2020-04-06 ENCOUNTER — Other Ambulatory Visit: Payer: Self-pay

## 2020-04-06 ENCOUNTER — Inpatient Hospital Stay: Payer: Medicare HMO | Attending: Adult Health | Admitting: Adult Health

## 2020-04-06 VITALS — BP 148/81 | HR 86 | Temp 97.8°F | Resp 18 | Ht 63.0 in | Wt 185.7 lb

## 2020-04-06 DIAGNOSIS — R309 Painful micturition, unspecified: Secondary | ICD-10-CM | POA: Diagnosis not present

## 2020-04-06 DIAGNOSIS — C50512 Malignant neoplasm of lower-outer quadrant of left female breast: Secondary | ICD-10-CM

## 2020-04-06 DIAGNOSIS — Z9013 Acquired absence of bilateral breasts and nipples: Secondary | ICD-10-CM | POA: Diagnosis not present

## 2020-04-06 DIAGNOSIS — Z853 Personal history of malignant neoplasm of breast: Secondary | ICD-10-CM | POA: Diagnosis not present

## 2020-04-06 DIAGNOSIS — Z9221 Personal history of antineoplastic chemotherapy: Secondary | ICD-10-CM | POA: Insufficient documentation

## 2020-04-06 DIAGNOSIS — Z9223 Personal history of estrogen therapy: Secondary | ICD-10-CM | POA: Diagnosis not present

## 2020-04-06 DIAGNOSIS — Z170 Estrogen receptor positive status [ER+]: Secondary | ICD-10-CM | POA: Diagnosis not present

## 2020-04-06 MED ORDER — NITROFURANTOIN MONOHYD MACRO 100 MG PO CAPS: 100.0000 mg | ORAL_CAPSULE | Freq: Two times a day (BID) | ORAL | 0 refills | Status: DC

## 2020-04-06 NOTE — Progress Notes (Signed)
CLINIC:  Survivorship   REASON FOR VISIT:  Routine follow-up for history of breast cancer.   BRIEF ONCOLOGIC HISTORY:  Oncology History  Breast cancer of lower-outer quadrant of left female breast ([place])  12/28/2010 Surgery   Bilateral mastectomies: Left: no residual cancer, 1/15 LN positive; Right: benign, 0/3 LN   01/29/2011 - 07/02/2011 Chemotherapy   adjuvant chemotherapy following NSABP B. 49 clinical trial. She received 4 cycles of dose dense Adriamycin and Cytoxan followed by weekly single agent Taxol    07/31/2011 -  Anti-estrogen oral therapy   tamoxifen 10 mg daily daily. A total of 10 years therapy is planned based on BCI testing inability to be performed to to insufficient tissue. She has also been on Aromasin and Arimidex in the past, and was unable to tolerate these       INTERVAL HISTORY:  Krystal [name] presents to the [facility] today for routine follow-up for her history of breast cancer.  She notes that she is having some burning when she urinates and burning around her perineum.  She was going to see her PCP about this, however their office had a water leak and they have been relocated to the main [facility].  She has some tingling down the left side of her arm for the past couple of months.  She denies any neck pain, or shoulder pain.  She wonders what the pain could be from.    Krystal [name] still struggles with swelling in her lower extremities and lymphedema. She is using her compression devices to help mobilize the fluid.  Due to her increased urination, she isn't taking as much Lasix as prescribed.    She denies any new issues or concerns regarding her breast/chest wall.    REVIEW OF SYSTEMS:  Review of Systems  Constitutional: Negative for appetite change, chills, fatigue, fever and unexpected weight change.  HENT:   Negative for hearing loss, lump/mass and trouble swallowing.   Eyes: Negative for eye problems and icterus.  Respiratory: Negative  for chest tightness, cough, shortness of breath and wheezing.   Cardiovascular: Positive for leg swelling. Negative for chest pain and palpitations.  Gastrointestinal: Negative for abdominal distention, abdominal pain, constipation, diarrhea, nausea and vomiting.  Endocrine: Negative for hot flashes.  Genitourinary: Positive for frequency. Negative for difficulty urinating.   Musculoskeletal: Negative for arthralgias.  Skin: Negative for itching and rash.  Neurological: Positive for numbness (per HPI). Negative for dizziness, extremity weakness and headaches.  Hematological: Negative for adenopathy. Does not bruise/bleed easily.  Psychiatric/Behavioral: Negative for depression. The patient is not nervous/anxious.   Breast: Denies any new nodularity, masses, tenderness, nipple changes, or nipple discharge.       PAST MEDICAL/SURGICAL HISTORY:  Past Medical History:  Diagnosis Date  . Anemia   . Arthritis    osteo  . Blindness of right eye at birth  . Cancer [facility]) 2011   Left breast 2011 and cervical age 3  . Carpal tunnel syndrome   . Cataract   . Depression   . Edema 07/27/2011  . Fibromyalgia    muscle weakness and pain  . GERD (gastroesophageal reflux disease)   . Hyperlipidemia   . Hypertension    benign  . Lymphedema   . Osteoporosis   . Plantar fasciitis   . Pre-diabetes   . Raynaud's disease   . Restless leg syndrome   . Rosacea   . Rotator cuff rupture   . Sleep apnea   . Synovitis   .  Ulcer    Past Surgical History:  Procedure Laterality Date  . ABDOMINAL HYSTERECTOMY  1986   For bleeding and pain  . BLADDER SURGERY  2006   Bladder tack  . BREAST RECONSTRUCTION Bilateral 09/17/2012   Procedure: BREAST RECONSTRUCTION;  Surgeon: Theodoro Kos, DO;  Location: [place];  Service: Plastics;  Laterality: Bilateral;  BILATERAL BREAST RECONSTRUCTION WITH TISSUE EXPANDERS AND ALLOMED  . BREAST RECONSTRUCTION Right 06/03/2013   Procedure: RIGHT  BREAST CAPSULE CONSTRACTURE;  Surgeon: Theodoro Kos, DO;  Location: [place];  Service: Plastics;  Laterality: Right;  . CARPAL TUNNEL RELEASE Bilateral 2008  . EYE SURGERY Right   . INJECTION KNEE Left 08/12/2018   Procedure: KNEE INJECTION-LEFT;  Surgeon: Corky Mull, MD;  Location: [facility];  Service: Orthopedics;  Laterality: Left;  . JOINT REPLACEMENT    . LIPOSUCTION Bilateral 01/29/2013   Procedure: LIPOSUCTION;  Surgeon: Theodoro Kos, DO;  Location: [place];  Service: Plastics;  Laterality: Bilateral;  . LIPOSUCTION Right 06/03/2013   Procedure: LIPOSUCTION;  Surgeon: Theodoro Kos, DO;  Location: [place];  Service: Plastics;  Laterality: Right;  . MASTECTOMY  2012   rt prophalactic mast-snbx  . MASTECTOMY MODIFIED RADICAL  2012   left-axillary nodes  . NEUROMA SURGERY Bilateral   . NOSE SURGERY  2007  . PORT-A-CATH REMOVAL     insertion and  . THROAT SURGERY    . TONSILLECTOMY    . TOTAL KNEE ARTHROPLASTY Right 08/12/2018   Procedure: TOTAL KNEE ARTHROPLASTY-RIGHT;  Surgeon: Corky Mull, MD;  Location: [facility];  Service: Orthopedics;  Laterality: Right;  . TOTAL KNEE ARTHROPLASTY Left 09/22/2019   Procedure: TOTAL KNEE ARTHROPLASTY;  Surgeon: Corky Mull, MD;  Location: [facility];  Service: Orthopedics;  Laterality: Left;  . UVULOPALATOPHARYNGOPLASTY       ALLERGIES:  Allergies  Allergen Reactions  . Cephalexin Nausea Only    Diaphoresis / Sweating (intolerance)  . Levaquin [Levofloxacin] Other (See Comments)    Stiff joints, unable to walk.     CURRENT MEDICATIONS:  Outpatient Encounter Medications as of 04/06/2020  Medication Sig  . alendronate (FOSAMAX) 70 MG tablet Take 70 mg by mouth every Saturday. Take with a full glass of water on an empty stomach.   Marland Kitchen amLODipine (NORVASC) 10 MG tablet Take 10 mg by mouth daily.   Marland Kitchen atenolol (TENORMIN) 100 MG tablet Take 100 mg by mouth daily.    . carisoprodol (SOMA)  350 MG tablet Take 350 mg by mouth in the morning and at bedtime.  . cetirizine (ZYRTEC) 10 MG tablet Take 10 mg by mouth daily.  . cholecalciferol (VITAMIN D3) 25 MCG (1000 UT) tablet Take 1,000 Units by mouth every evening.   . enoxaparin (LOVENOX) 40 MG/0.4ML injection Inject 0.4 mLs (40 mg total) into the skin daily.  . fluticasone (FLONASE) 50 MCG/ACT nasal spray Place 1 spray into both nostrils daily.   . furosemide (LASIX) 20 MG tablet Take 20 mg by mouth 2 (two) times daily.   . Glucosamine-Chondroitin (COSAMIN DS PO) Take 1 tablet by mouth 3 (three) times a week.  Marland Kitchen lisinopril-hydrochlorothiazide (PRINZIDE,ZESTORETIC) 20-25 MG per tablet Take 1 tablet by mouth every other day.   Marland Kitchen LYSINE ACETATE PO Take 4,000 mg by mouth daily as needed (cold sores).   . magnesium oxide (MAG-OX) 400 MG tablet Take 400 mg by mouth daily as needed (leg cramps).  . methocarbamol (ROBAXIN) 750 MG tablet Take 750  mg by mouth as needed.  Marland Kitchen omeprazole (PRILOSEC) 20 MG capsule Take 20 mg by mouth daily.  Marland Kitchen oxyCODONE (OXY IR/ROXICODONE) 5 MG immediate release tablet Take 1-2 tablets (5-10 mg total) by mouth every 4 (four) hours as needed for moderate pain.  . potassium chloride (KLOR-CON) 10 MEQ tablet Take 10 mEq by mouth in the morning and at bedtime.  . predniSONE (DELTASONE) 10 MG tablet Take 10 mg by mouth daily.  . pregabalin (LYRICA) 300 MG capsule Take 300 mg by mouth 2 (two) times daily.    Marland Kitchen rOPINIRole (REQUIP) 1 MG tablet Take 1 mg by mouth at bedtime.   . sulfamethoxazole-trimethoprim (BACTRIM DS) 800-160 MG tablet Take 1 tablet by mouth every 12 (twelve) hours.  . traMADol (ULTRAM) 50 MG tablet Take 1-2 tablets (50-100 mg total) by mouth every 6 (six) hours as needed for moderate pain.  Marland Kitchen triamcinolone (KENALOG) 0.025 % cream Apply 1 application topically daily as needed (rash).  . zolpidem (AMBIEN) 10 MG tablet Take 10 mg by mouth at bedtime.   No facility-administered encounter medications on  file as of 04/06/2020.     ONCOLOGIC FAMILY HISTORY:  Family History  Problem Relation Age of Onset  . Cancer Mother 59       non hodgkins lymphoma  . Cancer Father        neck, throat, lung  . Lung cancer Father   . Throat cancer Father   . Hypertension Sister   . Cancer Sister 37       Breast cancer dx'd 04/2011  . Hypertension Brother   . Breast cancer Maternal Aunt   . Liver cancer Maternal Uncle   . Cancer Maternal Grandmother        died 40, unknown cancer  . Cancer Maternal Grandfather        died in his 18s; unknown cancer  . Lung cancer Maternal Uncle   . Throat cancer Maternal Uncle   . Breast cancer Maternal Aunt   . Cancer Cousin        died under the age 57, unknown cancer  . Cancer Cousin        died age 72; unknown cancer     PHYSICAL EXAMINATION:  Vital Signs: Vitals:   04/06/20 1630  BP: (!) 148/81  Pulse: 86  Resp: 18  Temp: 97.8 F (36.6 C)  SpO2: 98%   Filed Weights   04/06/20 1630  Weight: 185 lb 11.2 oz (84.2 kg)   General: Well-nourished, well-appearing female in no acute distress.  Unaccompanied today.   HEENT: Head is normocephalic.  Pupils equal and reactive to light. Conjunctivae clear without exudate.  Sclerae anicteric. Mask in place. Lymph: No cervical, supraclavicular, or infraclavicular lymphadenopathy noted on palpation.  Cardiovascular: Regular rate and rhythm.Marland Kitchen Respiratory: Clear to auscultation bilaterally. Chest expansion symmetric; breathing non-labored.  Breast Exam:  S/p bilateral mastectomies with implant placement, no swelling, nodules, masses or any sign of recurrence -Axilla: No axillary adenopathy bilaterally.  GI: Abdomen soft and round; non-tender, non-distended. Bowel sounds normoactive. No hepatosplenomegaly.   GU: Deferred.  Neuro: No focal deficits. Steady gait.  Psych: Mood and affect normal and appropriate for situation.  MSK: No focal spinal tenderness to palpation, full range of motion in bilateral upper  extremities.  No TTP in cervical spine, bilateral shoulders.   Extremities: No edema. Skin: Warm and dry.  LABORATORY DATA:  No visits with results within 1 Day(s) from this visit.  Latest known visit with results is:  Admission on 09/22/2019, Discharged on 09/24/2019  Component Date Value Ref Range Status  . Sodium 09/23/2019 132* 135 - 145 mmol/L Final  . Potassium 09/23/2019 3.8  3.5 - 5.1 mmol/L Final  . Chloride 09/23/2019 94* 98 - 111 mmol/L Final  . CO2 09/23/2019 26  22 - 32 mmol/L Final  . Glucose, Bld 09/23/2019 124* 70 - 99 mg/dL Final   Glucose reference range applies only to samples taken after fasting for at least 8 hours.  . BUN 09/23/2019 14  8 - 23 mg/dL Final  . Creatinine, Ser 09/23/2019 1.31* 0.44 - 1.00 mg/dL Final  . Calcium 09/23/2019 8.3* 8.9 - 10.3 mg/dL Final  . GFR calc non Af Amer 09/23/2019 42* >60 mL/min Final  . GFR calc Af Amer 09/23/2019 49* >60 mL/min Final  . Anion gap 09/23/2019 12  5 - 15 Final   Performed at [facility], [address] [place], [place] [address]  . WBC 09/23/2019 9.1  4.0 - 10.5 K/uL Final  . RBC 09/23/2019 3.79* 3.87 - 5.11 MIL/uL Final  . Hemoglobin 09/23/2019 13.0  12.0 - 15.0 g/dL Final  . HCT 09/23/2019 38.7  36 - 46 % Final  . MCV 09/23/2019 102.1* 80.0 - 100.0 fL Final  . MCH 09/23/2019 34.3* 26.0 - 34.0 pg Final  . MCHC 09/23/2019 33.6  30.0 - 36.0 g/dL Final  . RDW 09/23/2019 15.8* 11.5 - 15.5 % Final  . Platelets 09/23/2019 182  150 - 400 K/uL Final  . nRBC 09/23/2019 0.0  0.0 - 0.2 % Final   Performed at [facility], [address], [place], [place] [address]  . Sodium 09/24/2019 132* 135 - 145 mmol/L Final  . Potassium 09/24/2019 3.7  3.5 - 5.1 mmol/L Final  . Chloride 09/24/2019 95* 98 - 111 mmol/L Final  . CO2 09/24/2019 29  22 - 32 mmol/L Final  . Glucose, Bld 09/24/2019 121* 70 - 99 mg/dL Final   Glucose reference range applies only to samples taken after fasting for at least 8 hours.    . BUN 09/24/2019 14  8 - 23 mg/dL Final  . Creatinine, Ser 09/24/2019 1.21* 0.44 - 1.00 mg/dL Final  . Calcium 09/24/2019 8.3* 8.9 - 10.3 mg/dL Final  . GFR calc non Af Amer 09/24/2019 47* >60 mL/min Final  . GFR calc Af Amer 09/24/2019 54* >60 mL/min Final  . Anion gap 09/24/2019 8  5 - 15 Final   Performed at [facility], [address] [place], [place] [address]  . WBC 09/24/2019 12.3* 4.0 - 10.5 K/uL Final  . RBC 09/24/2019 3.49* 3.87 - 5.11 MIL/uL Final  . Hemoglobin 09/24/2019 12.0  12.0 - 15.0 g/dL Final  . HCT 09/24/2019 35.3* 36 - 46 % Final  . MCV 09/24/2019 101.1* 80.0 - 100.0 fL Final  . MCH 09/24/2019 34.4* 26.0 - 34.0 pg Final  . MCHC 09/24/2019 34.0  30.0 - 36.0 g/dL Final  . RDW 09/24/2019 15.4  11.5 - 15.5 % Final  . Platelets 09/24/2019 171  150 - 400 K/uL Final  . nRBC 09/24/2019 0.0  0.0 - 0.2 % Final   Performed at [facility], [address] [place], [place] [address]  . Color, Urine 09/24/2019 YELLOW* YELLOW Final  . APPearance 09/24/2019 CLEAR* CLEAR Final  . Specific Gravity, Urine 09/24/2019 1.010  1.005 - 1.030 Final  . pH 09/24/2019 6.0  5.0 - 8.0 Final  . Glucose, UA 09/24/2019 NEGATIVE  NEGATIVE mg/dL  Final  . Hgb urine dipstick 09/24/2019 NEGATIVE  NEGATIVE Final  . Bilirubin Urine 09/24/2019 NEGATIVE  NEGATIVE Final  . Ketones, ur 09/24/2019 NEGATIVE  NEGATIVE mg/dL Final  . Protein, ur 09/24/2019 NEGATIVE  NEGATIVE mg/dL Final  . Nitrite 09/24/2019 POSITIVE* NEGATIVE Final  . Leukocytes,Ua 09/24/2019 SMALL* NEGATIVE Final  . RBC / HPF 09/24/2019 0-5  0 - 5 RBC/hpf Final  . WBC, UA 09/24/2019 11-20  0 - 5 WBC/hpf Final  . Bacteria, UA 09/24/2019 MANY* NONE SEEN Final  . Squamous Epithelial / LPF 09/24/2019 0-5  0 - 5 Final  . Mucus 09/24/2019 PRESENT   Final  . Budding Yeast 09/24/2019 PRESENT   Final   Performed at [facility], [address] [place], [place] [address]   Lab testing:   No visits with results  within 1 Day(s) from this visit.  Latest known visit with results is:  Admission on 09/22/2019, Discharged on 09/24/2019  Component Date Value Ref Range Status  . Sodium 09/23/2019 132* 135 - 145 mmol/L Final  . Potassium 09/23/2019 3.8  3.5 - 5.1 mmol/L Final  . Chloride 09/23/2019 94* 98 - 111 mmol/L Final  . CO2 09/23/2019 26  22 - 32 mmol/L Final  . Glucose, Bld 09/23/2019 124* 70 - 99 mg/dL Final   Glucose reference range applies only to samples taken after fasting for at least 8 hours.  . BUN 09/23/2019 14  8 - 23 mg/dL Final  . Creatinine, Ser 09/23/2019 1.31* 0.44 - 1.00 mg/dL Final  . Calcium 09/23/2019 8.3* 8.9 - 10.3 mg/dL Final  . GFR calc non Af Amer 09/23/2019 42* >60 mL/min Final  . GFR calc Af Amer 09/23/2019 49* >60 mL/min Final  . Anion gap 09/23/2019 12  5 - 15 Final   Performed at [facility], [address], [place], [place] [address]  . WBC 09/23/2019 9.1  4.0 - 10.5 K/uL Final  . RBC 09/23/2019 3.79* 3.87 - 5.11 MIL/uL Final  . Hemoglobin 09/23/2019 13.0  12.0 - 15.0 g/dL Final  . HCT 09/23/2019 38.7  36 - 46 % Final  . MCV 09/23/2019 102.1* 80.0 - 100.0 fL Final  . MCH 09/23/2019 34.3* 26.0 - 34.0 pg Final  . MCHC 09/23/2019 33.6  30.0 - 36.0 g/dL Final  . RDW 09/23/2019 15.8* 11.5 - 15.5 % Final  . Platelets 09/23/2019 182  150 - 400 K/uL Final  . nRBC 09/23/2019 0.0  0.0 - 0.2 % Final   Performed at [facility], [address], [place], [place] [address]  . Sodium 09/24/2019 132* 135 - 145 mmol/L Final  . Potassium 09/24/2019 3.7  3.5 - 5.1 mmol/L Final  . Chloride 09/24/2019 95* 98 - 111 mmol/L Final  . CO2 09/24/2019 29  22 - 32 mmol/L Final  . Glucose, Bld 09/24/2019 121* 70 - 99 mg/dL Final   Glucose reference range applies only to samples taken after fasting for at least 8 hours.  . BUN 09/24/2019 14  8 - 23 mg/dL Final  . Creatinine, Ser 09/24/2019 1.21* 0.44 - 1.00 mg/dL Final  . Calcium 09/24/2019 8.3* 8.9 - 10.3 mg/dL Final    . GFR calc non Af Amer 09/24/2019 47* >60 mL/min Final  . GFR calc Af Amer 09/24/2019 54* >60 mL/min Final  . Anion gap 09/24/2019 8  5 - 15 Final   Performed at [facility], [address], [place], [place] [address]  . WBC 09/24/2019 12.3* 4.0 - 10.5  K/uL Final  . RBC 09/24/2019 3.49* 3.87 - 5.11 MIL/uL Final  . Hemoglobin 09/24/2019 12.0  12.0 - 15.0 g/dL Final  . HCT 09/24/2019 35.3* 36 - 46 % Final  . MCV 09/24/2019 101.1* 80.0 - 100.0 fL Final  . MCH 09/24/2019 34.4* 26.0 - 34.0 pg Final  . MCHC 09/24/2019 34.0  30.0 - 36.0 g/dL Final  . RDW 09/24/2019 15.4  11.5 - 15.5 % Final  . Platelets 09/24/2019 171  150 - 400 K/uL Final  . nRBC 09/24/2019 0.0  0.0 - 0.2 % Final   Performed at [facility], [address], [place], [place] [address]  . Color, Urine 09/24/2019 YELLOW* YELLOW Final  . APPearance 09/24/2019 CLEAR* CLEAR Final  . Specific Gravity, Urine 09/24/2019 1.010  1.005 - 1.030 Final  . pH 09/24/2019 6.0  5.0 - 8.0 Final  . Glucose, UA 09/24/2019 NEGATIVE  NEGATIVE mg/dL Final  . Hgb urine dipstick 09/24/2019 NEGATIVE  NEGATIVE Final  . Bilirubin Urine 09/24/2019 NEGATIVE  NEGATIVE Final  . Ketones, ur 09/24/2019 NEGATIVE  NEGATIVE mg/dL Final  . Protein, ur 09/24/2019 NEGATIVE  NEGATIVE mg/dL Final  . Nitrite 09/24/2019 POSITIVE* NEGATIVE Final  . Leukocytes,Ua 09/24/2019 SMALL* NEGATIVE Final  . RBC / HPF 09/24/2019 0-5  0 - 5 RBC/hpf Final  . WBC, UA 09/24/2019 11-20  0 - 5 WBC/hpf Final  . Bacteria, UA 09/24/2019 MANY* NONE SEEN Final  . Squamous Epithelial / LPF 09/24/2019 0-5  0 - 5 Final  . Mucus 09/24/2019 PRESENT   Final  . Budding Yeast 09/24/2019 PRESENT   Final   Performed at [facility], [address] [place], [place] [address]    DIAGNOSTIC IMAGING:  Most recent mammogram:  N/a s/p bilateral mastectomy    ASSESSMENT AND PLAN:  Ms.. Kuan is a pleasant 69 y.o. female with history of Stage IIA left breast invasive  ductal carcinoma, ER+/PR+/HER2-, diagnosed in 12/2010, treated with bilateral mastectomy and axillary node dissection, adjuvant chemotherapy, and anti-estrogen therapy with Tamoxifen beginning in 07/2011 (reduced dose of 56m due to side effects).  She presents to the [facility] for surveillance and routine follow-up.   1. History of breast cancer:  Ms. MDenesis currently clinically without evidence of disease or recurrence of breast cancer.  She will return in one year for continued follow-up.  I encouraged her to call me with any questions or concerns before her next visit at the cancer center, and I would be happy to see her sooner, if needed.    2. Left arm paresthesia: I have ordered a CT chest to r/o anything cancer related.  She has a long history of joint issues, so this very well could be a nerve impingement of some sort.    3. Urinary frequency/burning: I gave her a urine cup.  At the time of her appointment completion, the lab was unfortunately closed.  I let her know that she could very likely urinate in the cup and bring to her PCP tomorrow to rule out UTI.  She can use the triamcinolone ointment for any perineal burning on the skin, as it sounds like she has a h/o lichen sclerosis.  4. Bone health:  Given Ms. Moxley's age, history of breast cancer, she is at risk for bone demineralization.  I will defer to her PCP for future bone density testing and management.   5. Cancer screening:  Due to Ms. Salton's history and her age, she should receive screening for skin  cancers and colon cancer.  She was encouraged to follow-up with her PCP for appropriate cancer screenings.   6. Health maintenance and wellness promotion: Ms. Mcgloin was encouraged to consume 5-7 servings of fruits and vegetables per day. She was also encouraged to engage in moderate to vigorous exercise for 30 minutes per day most days of the week. She was instructed to limit her alcohol consumption and continue to abstain from  tobacco use.  At her request, I had some routine labs tested for her PCP.  I will have these results faxed to her PCP once they are available.        Dispo:  -Return to cancer center in one year for LTS follow up   Total encounter time: 30 minutes*   Wilber Bihari, NP 04/10/20 10:22 AM Medical Oncology and Hematology [facility] [place], [place] [address] Tel. [phone]    Fax. [phone]  *Total Encounter Time as defined by the Centers for Medicare and Medicaid Services includes, in addition to the face-to-face time of a patient visit (documented in the note above) non-face-to-face time: obtaining and reviewing outside history, ordering and reviewing medications, tests or procedures, care coordination (communications with other health care professionals or caregivers) and documentation in the medical record.    Note: PRIMARY CARE PROVIDER [name], [place] [phone]

## 2020-04-07 ENCOUNTER — Telehealth: Payer: Self-pay | Admitting: Adult Health

## 2020-04-07 NOTE — Telephone Encounter (Signed)
Scheduled appts per 9/15 los. Pt confirmed appt date and time.  °

## 2020-04-10 MED ORDER — TRIAMCINOLONE ACETONIDE 0.025 % EX CREA: 1.0000 "application " | TOPICAL_CREAM | Freq: Every day | CUTANEOUS | 0 refills | Status: DC | PRN

## 2020-04-15 ENCOUNTER — Ambulatory Visit
Admission: RE | Admit: 2020-04-15 | Discharge: 2020-04-15 | Disposition: A | Discharge status: Home / Self Care | Payer: Medicare HMO | Source: Ambulatory Visit | Attending: Adult Health | Admitting: Adult Health

## 2020-04-15 ENCOUNTER — Telehealth: Payer: Self-pay

## 2020-04-15 ENCOUNTER — Other Ambulatory Visit: Payer: Self-pay

## 2020-04-15 DIAGNOSIS — Z170 Estrogen receptor positive status [ER+]: Secondary | ICD-10-CM | POA: Insufficient documentation

## 2020-04-15 DIAGNOSIS — C50512 Malignant neoplasm of lower-outer quadrant of left female breast: Secondary | ICD-10-CM | POA: Diagnosis not present

## 2020-04-15 DIAGNOSIS — M9902 Segmental and somatic dysfunction of thoracic region: Secondary | ICD-10-CM | POA: Diagnosis not present

## 2020-04-15 DIAGNOSIS — I2510 Atherosclerotic heart disease of native coronary artery without angina pectoris: Secondary | ICD-10-CM | POA: Diagnosis not present

## 2020-04-15 DIAGNOSIS — M9903 Segmental and somatic dysfunction of lumbar region: Secondary | ICD-10-CM | POA: Diagnosis not present

## 2020-04-15 DIAGNOSIS — C50919 Malignant neoplasm of unspecified site of unspecified female breast: Secondary | ICD-10-CM | POA: Diagnosis not present

## 2020-04-15 DIAGNOSIS — M5136 Other intervertebral disc degeneration, lumbar region: Secondary | ICD-10-CM | POA: Diagnosis not present

## 2020-04-15 DIAGNOSIS — M5134 Other intervertebral disc degeneration, thoracic region: Secondary | ICD-10-CM | POA: Diagnosis not present

## 2020-04-15 DIAGNOSIS — I700 Atherosclerosis of aorta: Secondary | ICD-10-CM | POA: Diagnosis not present

## 2020-04-15 DIAGNOSIS — I10 Essential (primary) hypertension: Secondary | ICD-10-CM | POA: Diagnosis not present

## 2020-04-15 LAB — POCT I-STAT CREATININE: Creatinine, Ser: 0.9 mg/dL (ref 0.44–1.00)

## 2020-04-15 MED ORDER — IOHEXOL 300 MG/ML  SOLN
75.0000 mL | Freq: Once | INTRAMUSCULAR | Status: AC | PRN
  Administered 2020-04-15: 75 mL via INTRAVENOUS

## 2020-04-15 NOTE — Telephone Encounter (Signed)
Called to give pt below information. Pt states she is going to chiropractor and this is helping, but if it persists she will call PCP. Pt verbalized thanks for the call of results.

## 2020-04-15 NOTE — Telephone Encounter (Signed)
-----   Message from Gardenia Phlegm, NP sent at 04/15/2020  4:17 PM EDT ----- Please call patient with results.  Scans do not show any cancer.  I recommend she f/uw ith her PCP if still feeling that pain ----- Message ----- From: Interface, Rad Results In Sent: 04/15/2020   1:09 PM EDT To: Gardenia Phlegm, NP

## 2020-04-25 DIAGNOSIS — M9903 Segmental and somatic dysfunction of lumbar region: Secondary | ICD-10-CM | POA: Diagnosis not present

## 2020-04-25 DIAGNOSIS — M9902 Segmental and somatic dysfunction of thoracic region: Secondary | ICD-10-CM | POA: Diagnosis not present

## 2020-04-25 DIAGNOSIS — M5134 Other intervertebral disc degeneration, thoracic region: Secondary | ICD-10-CM | POA: Diagnosis not present

## 2020-04-25 DIAGNOSIS — M5136 Other intervertebral disc degeneration, lumbar region: Secondary | ICD-10-CM | POA: Diagnosis not present

## 2020-04-27 DIAGNOSIS — M5134 Other intervertebral disc degeneration, thoracic region: Secondary | ICD-10-CM | POA: Diagnosis not present

## 2020-04-27 DIAGNOSIS — M9903 Segmental and somatic dysfunction of lumbar region: Secondary | ICD-10-CM | POA: Diagnosis not present

## 2020-04-27 DIAGNOSIS — M5136 Other intervertebral disc degeneration, lumbar region: Secondary | ICD-10-CM | POA: Diagnosis not present

## 2020-04-27 DIAGNOSIS — M9902 Segmental and somatic dysfunction of thoracic region: Secondary | ICD-10-CM | POA: Diagnosis not present

## 2020-05-02 DIAGNOSIS — M9903 Segmental and somatic dysfunction of lumbar region: Secondary | ICD-10-CM | POA: Diagnosis not present

## 2020-05-02 DIAGNOSIS — M5136 Other intervertebral disc degeneration, lumbar region: Secondary | ICD-10-CM | POA: Diagnosis not present

## 2020-05-02 DIAGNOSIS — M9902 Segmental and somatic dysfunction of thoracic region: Secondary | ICD-10-CM | POA: Diagnosis not present

## 2020-05-02 DIAGNOSIS — M5134 Other intervertebral disc degeneration, thoracic region: Secondary | ICD-10-CM | POA: Diagnosis not present

## 2020-05-09 DIAGNOSIS — M9902 Segmental and somatic dysfunction of thoracic region: Secondary | ICD-10-CM | POA: Diagnosis not present

## 2020-05-09 DIAGNOSIS — M9903 Segmental and somatic dysfunction of lumbar region: Secondary | ICD-10-CM | POA: Diagnosis not present

## 2020-05-09 DIAGNOSIS — M5136 Other intervertebral disc degeneration, lumbar region: Secondary | ICD-10-CM | POA: Diagnosis not present

## 2020-05-09 DIAGNOSIS — M5134 Other intervertebral disc degeneration, thoracic region: Secondary | ICD-10-CM | POA: Diagnosis not present

## 2020-05-30 DIAGNOSIS — M9902 Segmental and somatic dysfunction of thoracic region: Secondary | ICD-10-CM | POA: Diagnosis not present

## 2020-05-30 DIAGNOSIS — M5136 Other intervertebral disc degeneration, lumbar region: Secondary | ICD-10-CM | POA: Diagnosis not present

## 2020-05-30 DIAGNOSIS — M5134 Other intervertebral disc degeneration, thoracic region: Secondary | ICD-10-CM | POA: Diagnosis not present

## 2020-05-30 DIAGNOSIS — M9903 Segmental and somatic dysfunction of lumbar region: Secondary | ICD-10-CM | POA: Diagnosis not present

## 2020-05-31 IMAGING — CR DG CHEST 2V
2 series · 2 of 2 positions shown · non-contrast
Comparison: 01/18/2011

CLINICAL DATA: Cough, wheezing, some shortness of breath, sinus
drainage, history hypertension, LEFT breast cancer

EXAM:
CHEST - 2 VIEW

[w chest pa]
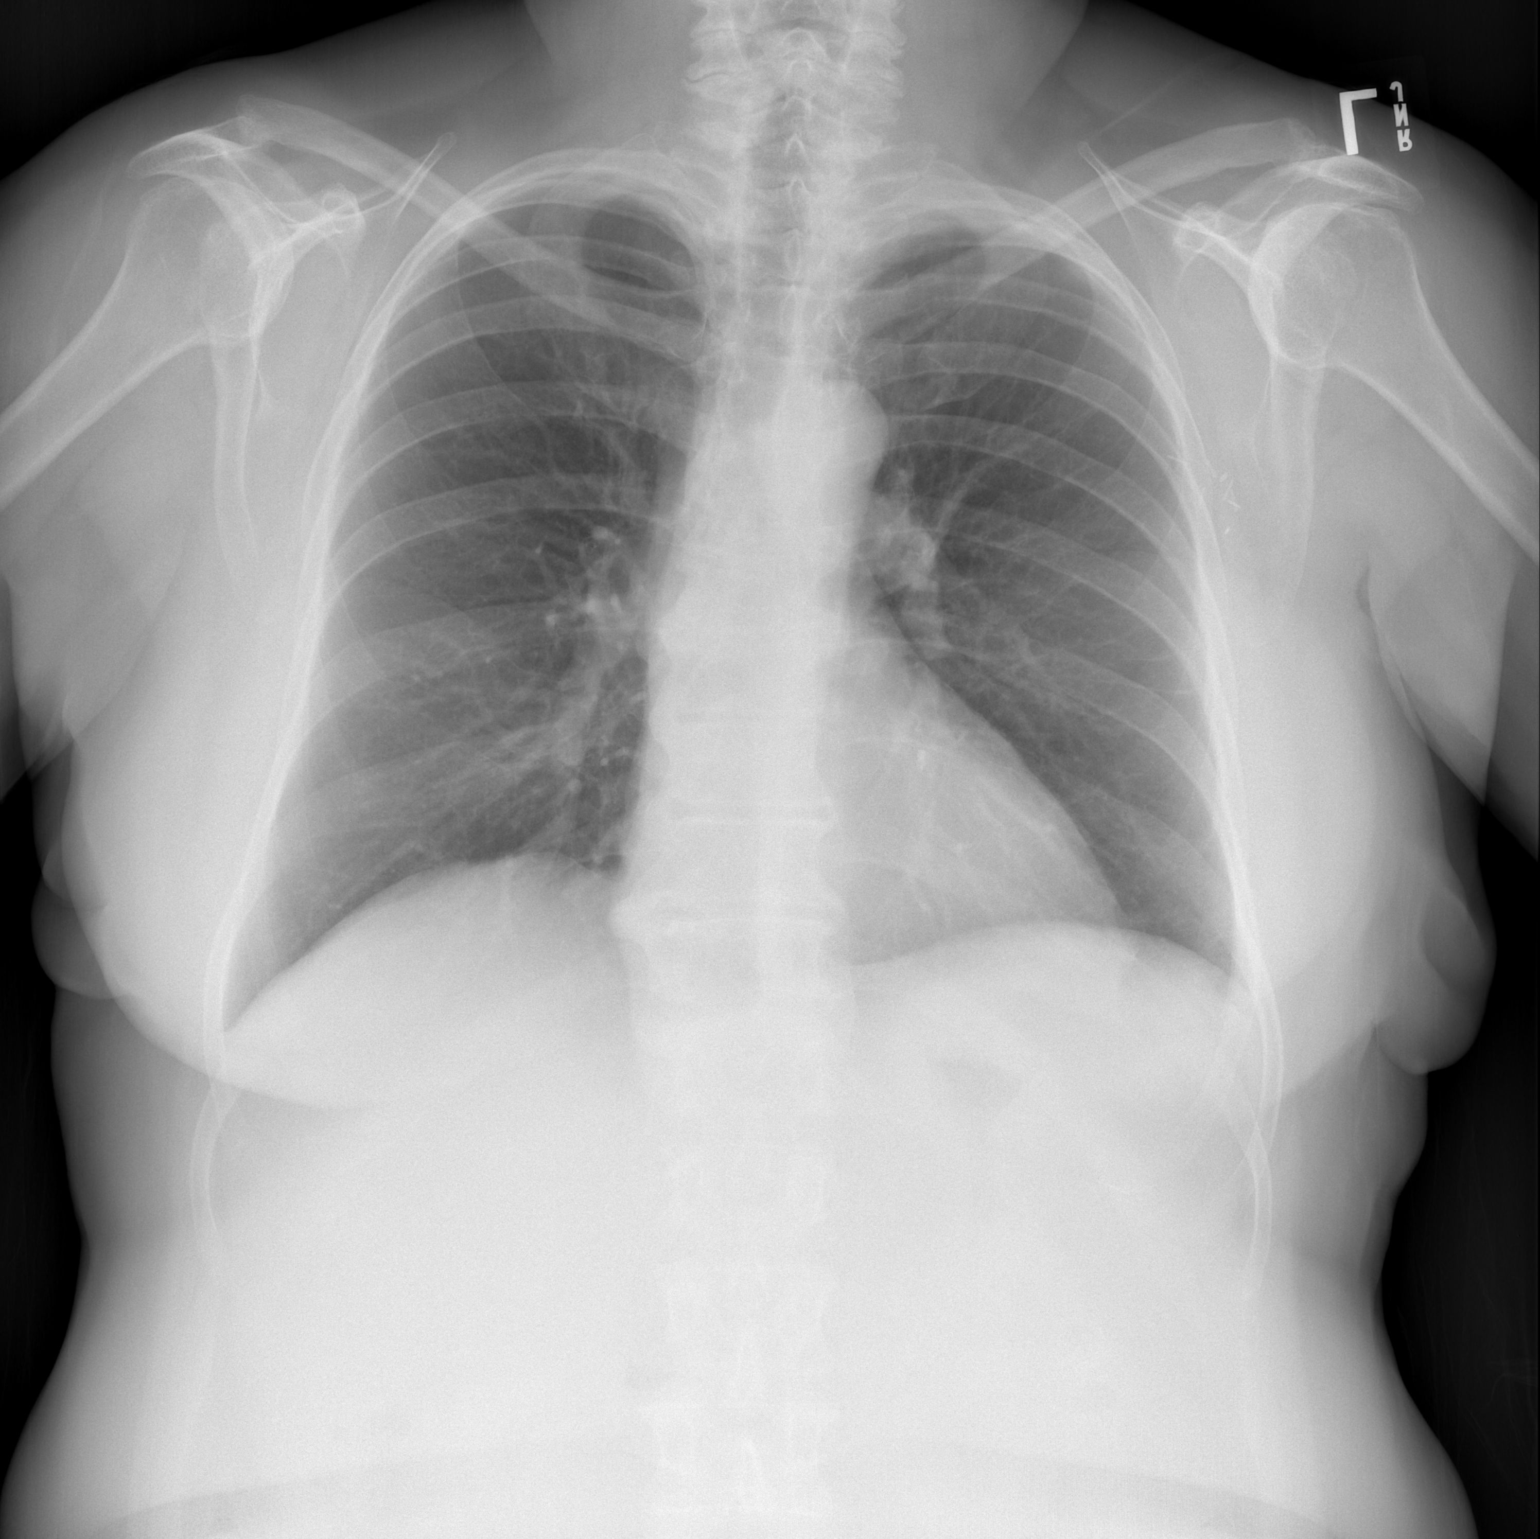

[w chest lat]
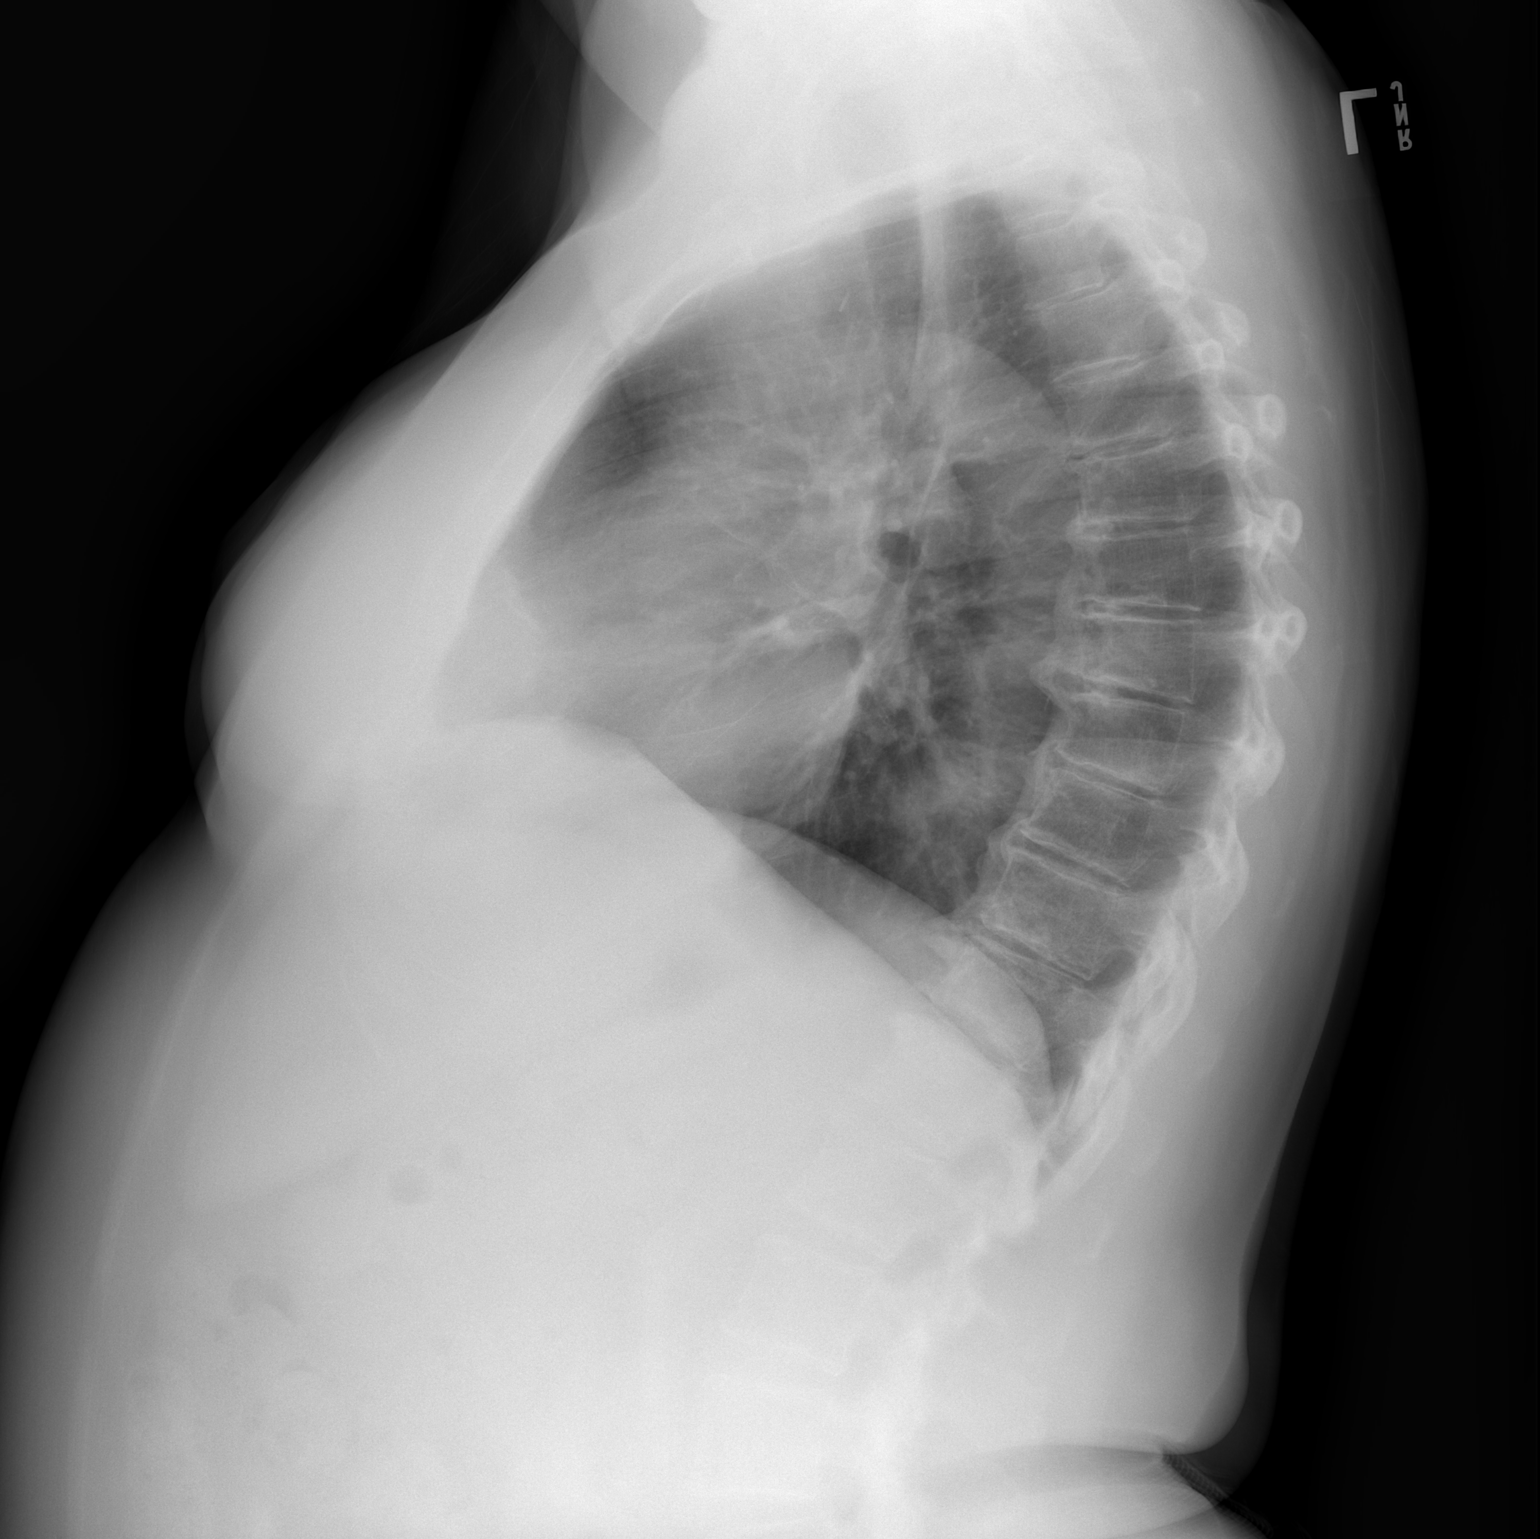

[2 of 2 positions shown; findings below may reference images not displayed]

FINDINGS: Normal heart size, mediastinal contours, and pulmonary vascularity.

Lungs clear.

No infiltrate, pleural effusion or pneumothorax.

Surgical clips LEFT axilla with note of a LEFT breast prosthesis.

Mild degenerative disc disease changes thoracic spine.
IMPRESSION: No acute abnormalities.

## 2020-06-20 DIAGNOSIS — M9902 Segmental and somatic dysfunction of thoracic region: Secondary | ICD-10-CM | POA: Diagnosis not present

## 2020-06-20 DIAGNOSIS — M9903 Segmental and somatic dysfunction of lumbar region: Secondary | ICD-10-CM | POA: Diagnosis not present

## 2020-06-20 DIAGNOSIS — M5136 Other intervertebral disc degeneration, lumbar region: Secondary | ICD-10-CM | POA: Diagnosis not present

## 2020-06-20 DIAGNOSIS — M5134 Other intervertebral disc degeneration, thoracic region: Secondary | ICD-10-CM | POA: Diagnosis not present

## 2020-07-11 DIAGNOSIS — M5136 Other intervertebral disc degeneration, lumbar region: Secondary | ICD-10-CM | POA: Diagnosis not present

## 2020-07-11 DIAGNOSIS — M9902 Segmental and somatic dysfunction of thoracic region: Secondary | ICD-10-CM | POA: Diagnosis not present

## 2020-07-11 DIAGNOSIS — M5134 Other intervertebral disc degeneration, thoracic region: Secondary | ICD-10-CM | POA: Diagnosis not present

## 2020-07-11 DIAGNOSIS — M9903 Segmental and somatic dysfunction of lumbar region: Secondary | ICD-10-CM | POA: Diagnosis not present

## 2020-07-20 DIAGNOSIS — R739 Hyperglycemia, unspecified: Secondary | ICD-10-CM | POA: Diagnosis not present

## 2020-07-20 DIAGNOSIS — E785 Hyperlipidemia, unspecified: Secondary | ICD-10-CM | POA: Diagnosis not present

## 2020-07-20 DIAGNOSIS — E559 Vitamin D deficiency, unspecified: Secondary | ICD-10-CM | POA: Diagnosis not present

## 2020-07-20 DIAGNOSIS — I10 Essential (primary) hypertension: Secondary | ICD-10-CM | POA: Diagnosis not present

## 2020-07-21 DIAGNOSIS — F32A Depression, unspecified: Secondary | ICD-10-CM | POA: Diagnosis not present

## 2020-07-21 DIAGNOSIS — E6609 Other obesity due to excess calories: Secondary | ICD-10-CM | POA: Diagnosis not present

## 2020-07-21 DIAGNOSIS — Z6833 Body mass index (BMI) 33.0-33.9, adult: Secondary | ICD-10-CM | POA: Diagnosis not present

## 2020-07-21 DIAGNOSIS — E785 Hyperlipidemia, unspecified: Secondary | ICD-10-CM | POA: Diagnosis not present

## 2020-07-21 DIAGNOSIS — I10 Essential (primary) hypertension: Secondary | ICD-10-CM | POA: Diagnosis not present

## 2020-07-21 DIAGNOSIS — I208 Other forms of angina pectoris: Secondary | ICD-10-CM | POA: Diagnosis not present

## 2020-07-21 DIAGNOSIS — I7300 Raynaud's syndrome without gangrene: Secondary | ICD-10-CM | POA: Diagnosis not present

## 2020-07-21 DIAGNOSIS — I890 Lymphedema, not elsewhere classified: Secondary | ICD-10-CM | POA: Diagnosis not present

## 2020-07-27 DIAGNOSIS — Z0000 Encounter for general adult medical examination without abnormal findings: Secondary | ICD-10-CM | POA: Diagnosis not present

## 2020-07-27 DIAGNOSIS — M1990 Unspecified osteoarthritis, unspecified site: Secondary | ICD-10-CM | POA: Diagnosis not present

## 2020-07-27 DIAGNOSIS — J309 Allergic rhinitis, unspecified: Secondary | ICD-10-CM | POA: Diagnosis not present

## 2020-07-27 DIAGNOSIS — E612 Magnesium deficiency: Secondary | ICD-10-CM | POA: Diagnosis not present

## 2020-07-27 DIAGNOSIS — E559 Vitamin D deficiency, unspecified: Secondary | ICD-10-CM | POA: Diagnosis not present

## 2020-07-27 DIAGNOSIS — M797 Fibromyalgia: Secondary | ICD-10-CM | POA: Diagnosis not present

## 2020-07-27 DIAGNOSIS — F4321 Adjustment disorder with depressed mood: Secondary | ICD-10-CM | POA: Diagnosis not present

## 2020-08-01 DIAGNOSIS — M5134 Other intervertebral disc degeneration, thoracic region: Secondary | ICD-10-CM | POA: Diagnosis not present

## 2020-08-01 DIAGNOSIS — M9902 Segmental and somatic dysfunction of thoracic region: Secondary | ICD-10-CM | POA: Diagnosis not present

## 2020-08-01 DIAGNOSIS — M5136 Other intervertebral disc degeneration, lumbar region: Secondary | ICD-10-CM | POA: Diagnosis not present

## 2020-08-01 DIAGNOSIS — M9903 Segmental and somatic dysfunction of lumbar region: Secondary | ICD-10-CM | POA: Diagnosis not present

## 2020-08-22 DIAGNOSIS — M9901 Segmental and somatic dysfunction of cervical region: Secondary | ICD-10-CM | POA: Diagnosis not present

## 2020-08-22 DIAGNOSIS — M62830 Muscle spasm of back: Secondary | ICD-10-CM | POA: Diagnosis not present

## 2020-08-22 DIAGNOSIS — M9902 Segmental and somatic dysfunction of thoracic region: Secondary | ICD-10-CM | POA: Diagnosis not present

## 2020-08-22 DIAGNOSIS — M5134 Other intervertebral disc degeneration, thoracic region: Secondary | ICD-10-CM | POA: Diagnosis not present

## 2020-09-08 DIAGNOSIS — M797 Fibromyalgia: Secondary | ICD-10-CM | POA: Diagnosis not present

## 2020-09-08 DIAGNOSIS — F4321 Adjustment disorder with depressed mood: Secondary | ICD-10-CM | POA: Diagnosis not present

## 2020-09-08 DIAGNOSIS — I890 Lymphedema, not elsewhere classified: Secondary | ICD-10-CM | POA: Diagnosis not present

## 2020-09-08 DIAGNOSIS — I10 Essential (primary) hypertension: Secondary | ICD-10-CM | POA: Diagnosis not present

## 2020-09-08 DIAGNOSIS — M25562 Pain in left knee: Secondary | ICD-10-CM | POA: Diagnosis not present

## 2020-09-08 DIAGNOSIS — I208 Other forms of angina pectoris: Secondary | ICD-10-CM | POA: Diagnosis not present

## 2020-09-12 DIAGNOSIS — M62830 Muscle spasm of back: Secondary | ICD-10-CM | POA: Diagnosis not present

## 2020-09-12 DIAGNOSIS — M9901 Segmental and somatic dysfunction of cervical region: Secondary | ICD-10-CM | POA: Diagnosis not present

## 2020-09-12 DIAGNOSIS — M9902 Segmental and somatic dysfunction of thoracic region: Secondary | ICD-10-CM | POA: Diagnosis not present

## 2020-09-12 DIAGNOSIS — M5134 Other intervertebral disc degeneration, thoracic region: Secondary | ICD-10-CM | POA: Diagnosis not present

## 2020-10-10 DIAGNOSIS — M5134 Other intervertebral disc degeneration, thoracic region: Secondary | ICD-10-CM | POA: Diagnosis not present

## 2020-10-10 DIAGNOSIS — M9901 Segmental and somatic dysfunction of cervical region: Secondary | ICD-10-CM | POA: Diagnosis not present

## 2020-10-10 DIAGNOSIS — M9902 Segmental and somatic dysfunction of thoracic region: Secondary | ICD-10-CM | POA: Diagnosis not present

## 2020-10-10 DIAGNOSIS — M62830 Muscle spasm of back: Secondary | ICD-10-CM | POA: Diagnosis not present

## 2020-10-11 ENCOUNTER — Other Ambulatory Visit: Payer: Self-pay | Admitting: Adult Health

## 2020-10-11 IMAGING — DX DG KNEE 1-2V PORT*R*
2 series · 2 of 2 positions shown · non-contrast
Comparison: None similar

CLINICAL DATA: Total knee replacement

EXAM:
PORTABLE RIGHT KNEE - 1-2 VIEW

[knee ap]
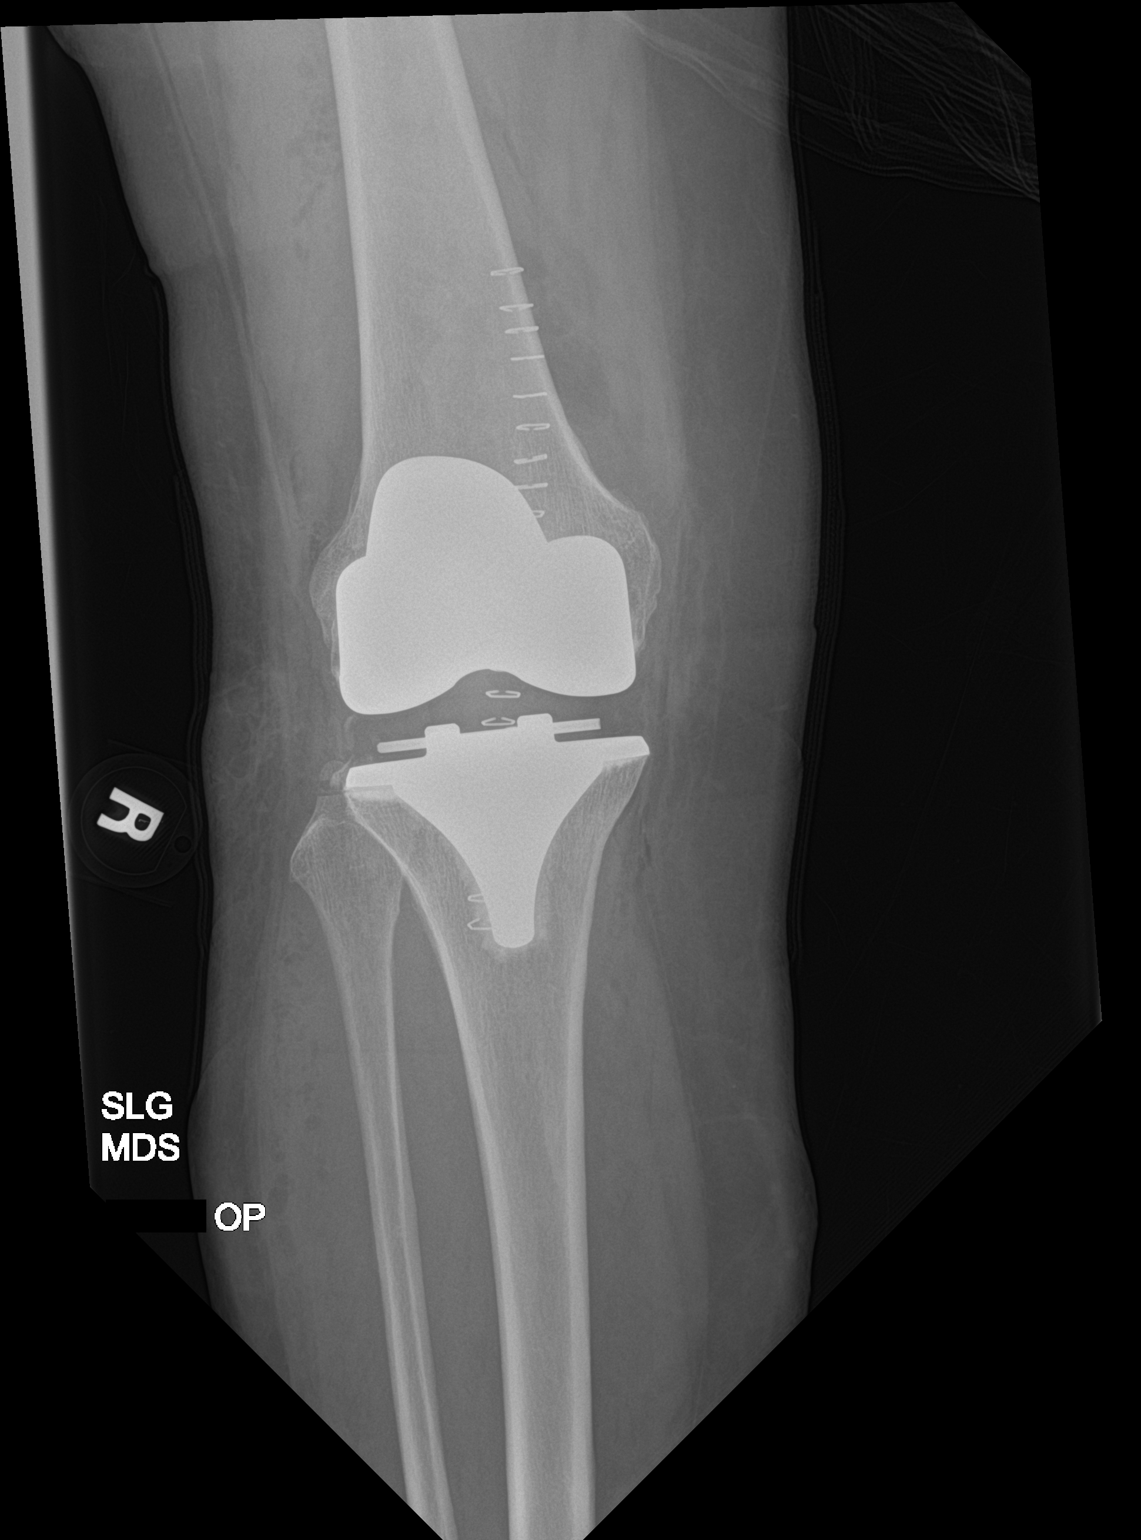

[knee lat]
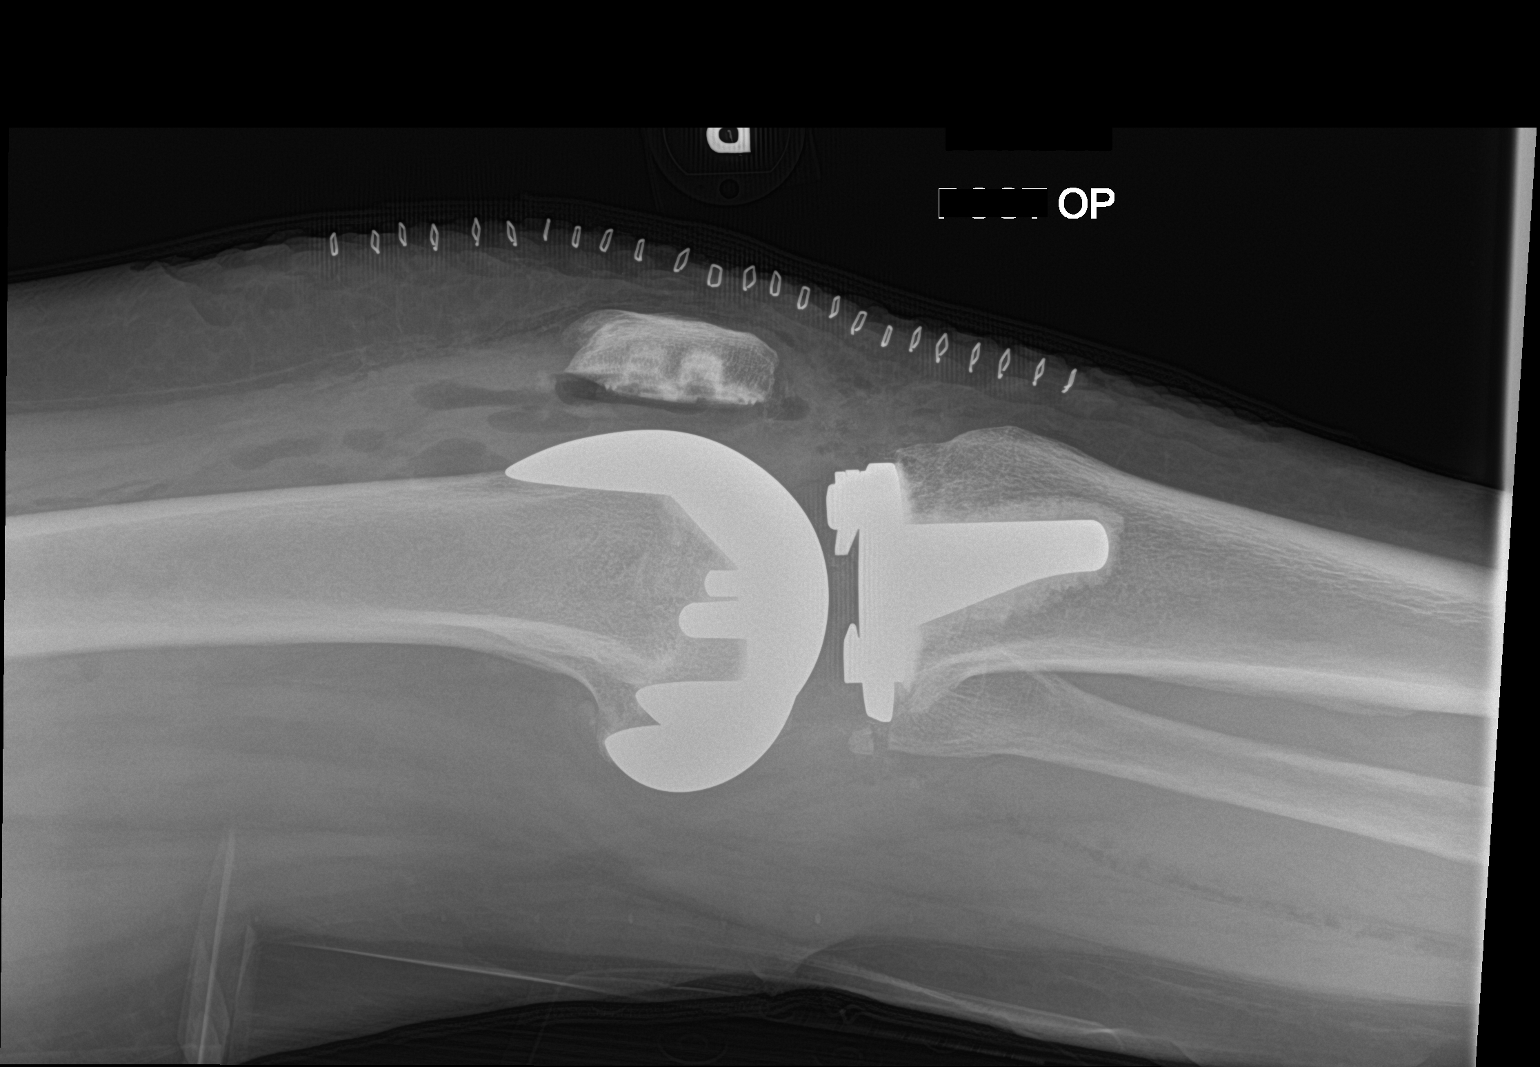

[2 of 2 positions shown; findings below may reference images not displayed]

FINDINGS: Total knee arthroplasty with well-seated prosthesis. No
periprosthetic fracture. Expected soft tissue gas and swelling.
IMPRESSION: No unexpected finding after total knee arthroplasty.

## 2020-11-01 ENCOUNTER — Other Ambulatory Visit: Payer: Self-pay | Admitting: Adult Health

## 2020-11-21 ENCOUNTER — Other Ambulatory Visit: Payer: Self-pay | Admitting: Adult Health

## 2021-02-21 ENCOUNTER — Telehealth: Payer: Self-pay | Admitting: Adult Health

## 2021-02-21 NOTE — Telephone Encounter (Signed)
Scheduled appointment per provider. Patient is aware. 

## 2021-03-15 DIAGNOSIS — M720 Palmar fascial fibromatosis [Dupuytren]: Secondary | ICD-10-CM | POA: Diagnosis not present

## 2021-03-15 DIAGNOSIS — I890 Lymphedema, not elsewhere classified: Secondary | ICD-10-CM | POA: Diagnosis not present

## 2021-04-12 ENCOUNTER — Encounter: Payer: Medicare HMO | Admitting: Adult Health

## 2021-04-13 ENCOUNTER — Other Ambulatory Visit: Payer: Self-pay

## 2021-04-13 ENCOUNTER — Encounter: Payer: Self-pay | Admitting: Adult Health

## 2021-04-13 ENCOUNTER — Inpatient Hospital Stay: Payer: Medicare HMO | Attending: Adult Health | Admitting: Adult Health

## 2021-04-13 VITALS — BP 104/80 | HR 60 | Temp 99.1°F | Resp 18 | Ht 63.0 in | Wt 208.4 lb

## 2021-04-13 DIAGNOSIS — R000 Tachycardia, unspecified: Secondary | ICD-10-CM | POA: Diagnosis not present

## 2021-04-13 DIAGNOSIS — Z9013 Acquired absence of bilateral breasts and nipples: Secondary | ICD-10-CM | POA: Diagnosis not present

## 2021-04-13 DIAGNOSIS — Z170 Estrogen receptor positive status [ER+]: Secondary | ICD-10-CM

## 2021-04-13 DIAGNOSIS — C50512 Malignant neoplasm of lower-outer quadrant of left female breast: Secondary | ICD-10-CM | POA: Diagnosis not present

## 2021-04-13 DIAGNOSIS — N3941 Urge incontinence: Secondary | ICD-10-CM | POA: Diagnosis not present

## 2021-04-13 DIAGNOSIS — R0982 Postnasal drip: Secondary | ICD-10-CM | POA: Diagnosis not present

## 2021-04-13 DIAGNOSIS — Z853 Personal history of malignant neoplasm of breast: Secondary | ICD-10-CM | POA: Diagnosis not present

## 2021-04-13 NOTE — Progress Notes (Signed)
CLINIC:  Survivorship   REASON FOR VISIT:  Routine follow-up for history of breast cancer.   BRIEF ONCOLOGIC HISTORY:  Oncology History  Breast cancer of lower-outer quadrant of left female breast ([place])  12/28/2010 Surgery   Bilateral mastectomies: Left: no residual cancer, 1/15 LN positive; Right: benign, 0/3 LN   01/29/2011 - 07/02/2011 Chemotherapy   adjuvant chemotherapy following NSABP B. 49 clinical trial. She received 4 cycles of dose dense Adriamycin and Cytoxan followed by weekly single agent Taxol    07/31/2011 -  Anti-estrogen oral therapy   tamoxifen 10 mg daily daily. A total of 10 years therapy is planned based on BCI testing inability to be performed to to insufficient tissue. She has also been on Aromasin and Arimidex in the past, and was unable to tolerate these       INTERVAL HISTORY:  Krystal [name] presents to the [facility] today for routine follow-up for her history of breast cancer. She notes that she has continued to have some nasal drainage and some hot flashes and sweating.  She wonders what she can do to help this.  She notes some post nasal drainage and uses chloraseptic spray.  Krystal [name] notes she has had increasing fatigue.  She has gained about 20 pounds in the past year and notes that she is has not been exercising at the [facility] center as she previously was.  She has no breast changes.  REVIEW OF SYSTEMS:  Review of Systems  Constitutional:  Positive for fatigue and unexpected weight change. Negative for appetite change, chills and fever.  HENT:   Negative for hearing loss, lump/mass and trouble swallowing.   Eyes:  Negative for eye problems and icterus.  Respiratory:  Negative for chest tightness, cough, shortness of breath and wheezing.   Cardiovascular:  Positive for leg swelling (chronic lymphedema). Negative for chest pain and palpitations.  Gastrointestinal:  Negative for abdominal distention, abdominal pain, constipation, diarrhea, nausea and  vomiting.  Endocrine: Positive for hot flashes.  Genitourinary:  Negative for difficulty urinating and frequency.   Musculoskeletal:  Negative for arthralgias.  Skin:  Negative for itching and rash.  Neurological:  Negative for dizziness, extremity weakness, headaches and numbness.  Hematological:  Negative for adenopathy. Does not bruise/bleed easily.  Psychiatric/Behavioral:  Negative for depression. The patient is not nervous/anxious.  Breast: Denies any new nodularity, masses, tenderness, nipple changes, or nipple discharge.       PAST MEDICAL/SURGICAL HISTORY:  Past Medical History:  Diagnosis Date   Anemia    Arthritis    osteo   Blindness of right eye at birth   Cancer [facility]) 2011   Left breast 2011 and cervical age 89   Carpal tunnel syndrome    Cataract    Depression    Edema 07/27/2011   Fibromyalgia    muscle weakness and pain   GERD (gastroesophageal reflux disease)    Hyperlipidemia    Hypertension    benign   Lymphedema    Osteoporosis    Plantar fasciitis    Pre-diabetes    Raynaud's disease    Restless leg syndrome    Rosacea    Rotator cuff rupture    Sleep apnea    Synovitis    Ulcer    Past Surgical History:  Procedure Laterality Date   ABDOMINAL HYSTERECTOMY  1986   For bleeding and pain   BLADDER SURGERY  2006   Bladder tack   BREAST RECONSTRUCTION Bilateral 09/17/2012   Procedure: BREAST RECONSTRUCTION;  Surgeon: Theodoro Kos, DO;  Location: [place];  Service: Plastics;  Laterality: Bilateral;  BILATERAL BREAST RECONSTRUCTION WITH TISSUE EXPANDERS AND ALLOMED   BREAST RECONSTRUCTION Right 06/03/2013   Procedure: RIGHT BREAST CAPSULE CONSTRACTURE;  Surgeon: Theodoro Kos, DO;  Location: [place];  Service: Plastics;  Laterality: Right;   CARPAL TUNNEL RELEASE Bilateral 2008   EYE SURGERY Right    INJECTION KNEE Left 08/12/2018   Procedure: KNEE INJECTION-LEFT;  Surgeon: Corky Mull, MD;  Location: [facility];   Service: Orthopedics;  Laterality: Left;   JOINT REPLACEMENT     LIPOSUCTION Bilateral 01/29/2013   Procedure: LIPOSUCTION;  Surgeon: Theodoro Kos, DO;  Location: [place];  Service: Plastics;  Laterality: Bilateral;   LIPOSUCTION Right 06/03/2013   Procedure: LIPOSUCTION;  Surgeon: Theodoro Kos, DO;  Location: [place];  Service: Plastics;  Laterality: Right;   MASTECTOMY  2012   rt prophalactic mast-snbx   MASTECTOMY MODIFIED RADICAL  2012   left-axillary nodes   NEUROMA SURGERY Bilateral    NOSE SURGERY  2007   PORT-A-CATH REMOVAL     insertion and   THROAT SURGERY     TONSILLECTOMY     TOTAL KNEE ARTHROPLASTY Right 08/12/2018   Procedure: TOTAL KNEE ARTHROPLASTY-RIGHT;  Surgeon: Corky Mull, MD;  Location: [facility];  Service: Orthopedics;  Laterality: Right;   TOTAL KNEE ARTHROPLASTY Left 09/22/2019   Procedure: TOTAL KNEE ARTHROPLASTY;  Surgeon: Corky Mull, MD;  Location: [facility];  Service: Orthopedics;  Laterality: Left;   UVULOPALATOPHARYNGOPLASTY       ALLERGIES:  Allergies  Allergen Reactions   Cephalexin Nausea Only    Diaphoresis / Sweating (intolerance)   Levaquin [Levofloxacin] Other (See Comments)    Stiff joints, unable to walk.     CURRENT MEDICATIONS:  Outpatient Encounter Medications as of 04/13/2021  Medication Sig   alendronate (FOSAMAX) 70 MG tablet Take 70 mg by mouth every Saturday. Take with a full glass of water on an empty stomach.    amLODipine (NORVASC) 10 MG tablet Take 10 mg by mouth daily.    atenolol (TENORMIN) 100 MG tablet Take 100 mg by mouth every other day.    carisoprodol (SOMA) 350 MG tablet Take 350 mg by mouth in the morning and at bedtime.   cholecalciferol (VITAMIN D3) 25 MCG (1000 UT) tablet Take 1,000 Units by mouth every evening.    enoxaparin (LOVENOX) 40 MG/0.4ML injection Inject 0.4 mLs (40 mg total) into the skin daily.   fluticasone (FLONASE) 50 MCG/ACT nasal spray Place 1 spray into  both nostrils daily.    furosemide (LASIX) 20 MG tablet Take 20 mg by mouth 2 (two) times daily.    Glucosamine-Chondroitin (COSAMIN DS PO) Take 1 tablet by mouth 3 (three) times a week.   lisinopril-hydrochlorothiazide (PRINZIDE,ZESTORETIC) 20-25 MG per tablet Take 1 tablet by mouth every other day.    LYSINE ACETATE PO Take 4,000 mg by mouth daily as needed (cold sores).    magnesium oxide (MAG-OX) 400 MG tablet Take 400 mg by mouth daily as needed (leg cramps).   methocarbamol (ROBAXIN) 750 MG tablet Take 750 mg by mouth as needed.   nitrofurantoin, macrocrystal-monohydrate, (MACROBID) 100 MG capsule Take 1 capsule (100 mg total) by mouth 2 (two) times daily.   omeprazole (PRILOSEC) 20 MG capsule Take 20 mg by mouth daily.   oxyCODONE (OXY IR/ROXICODONE) 5 MG immediate release tablet Take 1-2 tablets (5-10 mg total) by mouth every  4 (four) hours as needed for moderate pain.   potassium chloride (KLOR-CON) 10 MEQ tablet Take 10 mEq by mouth in the morning and at bedtime.   predniSONE (DELTASONE) 10 MG tablet Take 10 mg by mouth daily.   pregabalin (LYRICA) 300 MG capsule Take 300 mg by mouth 2 (two) times daily.     rOPINIRole (REQUIP) 1 MG tablet Take 1 mg by mouth at bedtime.    traMADol (ULTRAM) 50 MG tablet Take 1-2 tablets (50-100 mg total) by mouth every 6 (six) hours as needed for moderate pain.   triamcinolone (KENALOG) 0.025 % cream APPLY 1 APPLICATION TOPICALLY DAILY AS NEEDED (RASH).   zolpidem (AMBIEN) 10 MG tablet Take 10 mg by mouth at bedtime.   No facility-administered encounter medications on file as of 04/13/2021.     ONCOLOGIC FAMILY HISTORY:  Family History  Problem Relation Age of Onset   Cancer Mother 1       non hodgkins lymphoma   Cancer Father        neck, throat, lung   Lung cancer Father    Throat cancer Father    Hypertension Sister    Cancer Sister 85       Breast cancer dx'd 04/2011   Hypertension Brother    Breast cancer Maternal Aunt    Liver  cancer Maternal Uncle    Cancer Maternal Grandmother        died 35, unknown cancer   Cancer Maternal Grandfather        died in his 22s; unknown cancer   Lung cancer Maternal Uncle    Throat cancer Maternal Uncle    Breast cancer Maternal Aunt    Cancer Cousin        died under the age 37, unknown cancer   Cancer Cousin        died age 68; unknown cancer     PHYSICAL EXAMINATION:  Vital Signs: Vitals:   04/13/21 1006  BP: 104/80  Pulse: 60  Resp: 18  Temp: 99.1 F (37.3 C)  SpO2: 95%   Filed Weights   04/13/21 1006  Weight: 208 lb 6.4 oz (94.5 kg)   General: Well-nourished, well-appearing female in no acute distress.  Unaccompanied today.   HEENT: Head is normocephalic.  Pupils equal and reactive to light. Conjunctivae clear without exudate.  Sclerae anicteric. Mask in place. Lymph: No cervical, supraclavicular, or infraclavicular lymphadenopathy noted on palpation.  Cardiovascular: irregular irregular, + tachycardia Respiratory: Clear to auscultation bilaterally. Chest expansion symmetric; breathing non-labored.  Breast Exam:  S/p bilateral mastectomies with implant placement, no swelling, nodules, masses or any sign of recurrence -Axilla: No axillary adenopathy bilaterally.  GI: Abdomen soft and round; non-tender, non-distended. Bowel sounds normoactive. No hepatosplenomegaly.   GU: Deferred.  Neuro: No focal deficits. Steady gait.  Psych: Mood and affect normal and appropriate for situation.  MSK: No focal spinal tenderness to palpation, full range of motion in bilateral upper extremities.  No TTP in cervical spine, bilateral shoulders.   Extremities: No edema. Skin: Warm and dry.  LABORATORY DATA:  No visits with results within 1 Day(s) from this visit.  Latest known visit with results is:  Hospital Outpatient Visit on 04/15/2020  Component Date Value Ref Range Status   Creatinine, Ser 04/15/2020 0.90  0.44 - 1.00 mg/dL Final   Lab testing:   No visits with  results within 1 Day(s) from this visit.  Latest known visit with results is:  Hospital Outpatient Visit on 04/15/2020  Component Date Value Ref Range Status   Creatinine, Ser 04/15/2020 0.90  0.44 - 1.00 mg/dL Final    DIAGNOSTIC IMAGING:  Most recent mammogram:  N/a s/p bilateral mastectomy    ASSESSMENT AND PLAN:  Krystal [name] is a pleasant 70 y.o. female with history of Stage IIA left breast invasive ductal carcinoma, ER+/PR+/HER2-, diagnosed in 12/2010, treated with bilateral mastectomy and axillary node dissection, adjuvant chemotherapy, and anti-estrogen therapy with Tamoxifen beginning in 07/2011 (reduced dose of 20m due to side effects).  [facility] for [facility] and routine follow-up.   1. History of breast cancer:  Krystal [name] currently clinically without evidence of disease or recurrence of breast cancer.  She will return in one year for continued follow-up.  I encouraged her to call me with any questions or concerns before her next visit at the cancer center, and I would be happy to see her sooner, if needed.    2. Tachycardia, irregular rhythm on exam: I got an EKG which read as junctional rhythm at 106. In review of her past physical exams by her PCP and cardiology, no one else has noted an irregular heart rhythm.  She is not having any classic symptoms of this.   I instructed Krystal [name] call her cardiologist, Dr. CClayborn Bignessabout these results today and bring her EKG over there for him to review so that he can see if he would like to see her sooner than in 2 weeks. I reinforced that if she develops shortness of breath, chest pain, palpitations to go to the ER.    3. Throat dryness: Due to her post nasal drainage.  I recommended that she sleep with a humidifier and use saline nasal spray.    4. Urge incontinence: I placed a referral for Krystal [name] to see our pelvic rehab PT team.    5. Weight gain: I recommended she consider evaluation and management  with healthy weight and wellness.  I gave them her phone number.    5. Bone health:  Given Krystal [name] age, history of breast cancer, she is at risk for bone demineralization.  I will defer to her PCP for future bone density testing and management.   6. Cancer screening:  Due to Krystal [name] history and her age, she should receive screening for skin cancers and colon cancer.  She was encouraged to follow-up with her PCP for appropriate cancer screenings.   7. Health maintenance and wellness promotion: Ms. MLarkinwas encouraged to consume 5-7 servings of fruits and vegetables per day. She will exercise per her cardiologists recommendations. She was instructed to limit her alcohol consumption and continue to abstain from tobacco use.       Dispo:  -Return to cancer center in one year for LTS follow up   Total encounter time: 45 minutes* in face to face visit time, chart review, order entry, documentation review, care coordination and documentation of the encounter.   LWilber Bihari NP 04/13/21 10:39 AM Medical Oncology and H[facility] [id number]. [phone]   Fax. [phone] *Total Encounter Time as defined by the Centers for Medicare and Medicaid Services includes, in addition to the face-to-face time of a patient visit (documented in the note above) non-face-to-face time: obtaining and reviewing outside history, ordering and reviewing medications, tests or procedures, care coordination (communications with other health care professionals or caregivers) and documentation in the medical record.    Note: PRIMARY  CARE PROVIDER [name], [place] [phone]

## 2021-04-19 ENCOUNTER — Other Ambulatory Visit: Payer: Self-pay

## 2021-04-19 DIAGNOSIS — I890 Lymphedema, not elsewhere classified: Secondary | ICD-10-CM | POA: Diagnosis not present

## 2021-04-19 MED ORDER — TRIAMCINOLONE ACETONIDE 0.025 % EX CREA: 1.0000 "application " | TOPICAL_CREAM | Freq: Every day | CUTANEOUS | 0 refills | Status: DC | PRN

## 2021-04-19 NOTE — Telephone Encounter (Signed)
Pt called requesting refill for Triamcinolone cream. Pt aware refill has been sent to preferred phx.

## 2021-05-11 ENCOUNTER — Other Ambulatory Visit: Payer: Self-pay

## 2021-05-11 ENCOUNTER — Encounter: Payer: Self-pay | Admitting: Physical Therapy

## 2021-05-11 ENCOUNTER — Encounter: Payer: Medicare HMO | Attending: Adult Health | Admitting: Physical Therapy

## 2021-05-11 DIAGNOSIS — R159 Full incontinence of feces: Secondary | ICD-10-CM | POA: Insufficient documentation

## 2021-05-11 DIAGNOSIS — R32 Unspecified urinary incontinence: Secondary | ICD-10-CM | POA: Diagnosis not present

## 2021-05-11 DIAGNOSIS — M6281 Muscle weakness (generalized): Secondary | ICD-10-CM | POA: Insufficient documentation

## 2021-05-11 DIAGNOSIS — R278 Other lack of coordination: Secondary | ICD-10-CM | POA: Diagnosis not present

## 2021-05-11 NOTE — Therapy (Signed)
[facility] at [facility] [address] [place], [place], [address] Phone: [phone]   Fax:  [phone]  Physical Therapy Evaluation  Patient Details  Name: Krystal [name] MRN: [id number] Date of Birth: 11/17/1950 Referring Provider (PT): Gardenia Phlegm, NP   Encounter Date: 05/11/2021   PT End of Session - 05/11/21 1642     Visit Number 1    Date for PT Re-Evaluation 08/03/21    Authorization Type [employer]    PT Start Time 1600    PT Stop Time 1655    PT Time Calculation (min) 55 min    Activity Tolerance Patient tolerated treatment well    Behavior During Therapy [facility] for tasks assessed/performed             Past Medical History:  Diagnosis Date   Anemia    Arthritis    osteo   Blindness of right eye at birth   Cancer [facility]) 2011   Left breast 2011 and cervical age 20   Carpal tunnel syndrome    Cataract    Depression    Edema 07/27/2011   Fibromyalgia    muscle weakness and pain   GERD (gastroesophageal reflux disease)    Hyperlipidemia    Hypertension    benign   Lymphedema    Osteoporosis    Plantar fasciitis    Pre-diabetes    Raynaud's disease    Restless leg syndrome    Rosacea    Rotator cuff rupture    Sleep apnea    Synovitis    Ulcer     Past Surgical History:  Procedure Laterality Date   ABDOMINAL HYSTERECTOMY  1986   For bleeding and pain   BLADDER SURGERY  2006   Bladder tack   BREAST RECONSTRUCTION Bilateral 09/17/2012   Procedure: BREAST RECONSTRUCTION;  Surgeon: Theodoro Kos, DO;  Location: [place];  Service: Plastics;  Laterality: Bilateral;  BILATERAL BREAST RECONSTRUCTION WITH TISSUE EXPANDERS AND ALLOMED   BREAST RECONSTRUCTION Right 06/03/2013   Procedure: RIGHT BREAST CAPSULE CONSTRACTURE;  Surgeon: Theodoro Kos, DO;  Location: [place];  Service: Plastics;  Laterality: Right;   CARPAL TUNNEL RELEASE Bilateral 2008   EYE SURGERY  Right    INJECTION KNEE Left 08/12/2018   Procedure: KNEE INJECTION-LEFT;  Surgeon: Corky Mull, MD;  Location: [facility];  Service: Orthopedics;  Laterality: Left;   JOINT REPLACEMENT     LIPOSUCTION Bilateral 01/29/2013   Procedure: LIPOSUCTION;  Surgeon: Theodoro Kos, DO;  Location: [place];  Service: Plastics;  Laterality: Bilateral;   LIPOSUCTION Right 06/03/2013   Procedure: LIPOSUCTION;  Surgeon: Theodoro Kos, DO;  Location: [place];  Service: Plastics;  Laterality: Right;   MASTECTOMY  2012   rt prophalactic mast-snbx   MASTECTOMY MODIFIED RADICAL  2012   left-axillary nodes   NEUROMA SURGERY Bilateral    NOSE SURGERY  2007   PORT-A-CATH REMOVAL     insertion and   THROAT SURGERY     TONSILLECTOMY     TOTAL KNEE ARTHROPLASTY Right 08/12/2018   Procedure: TOTAL KNEE ARTHROPLASTY-RIGHT;  Surgeon: Corky Mull, MD;  Location: [facility];  Service: Orthopedics;  Laterality: Right;   TOTAL KNEE ARTHROPLASTY Left 09/22/2019   Procedure: TOTAL KNEE ARTHROPLASTY;  Surgeon: Corky Mull, MD;  Location: [facility];  Service: Orthopedics;  Laterality: Left;   UVULOPALATOPHARYNGOPLASTY      There were no vitals filed for this visit.  Subjective Assessment - 05/11/21 1610     Subjective Patient vaginal area is burning. She will urinate a little then will have to urinate. When she walks to the bathroom will wet through a pad, wash cloth, and clothes.    Patient Stated Goals Work on urinary leakage    Currently in Pain? No/denies    Multiple Pain Sites No                [facility] PT Assessment - 05/11/21 0001       Assessment   Medical Diagnosis N39.41 Urinary incontinenc, urge    Referring Provider (PT) Gardenia Phlegm, NP    Onset Date/Surgical Date --   chronic   Prior Therapy none      Precautions   Precautions Other (comment)    Precaution Comments Breast cancer      Restrictions   Weight Bearing Restrictions No       Balance Screen   Has the patient fallen in the past 6 months No    Has the patient had a decrease in activity level because of a fear of falling?  No    Is the patient reluctant to leave their home because of a fear of falling?  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, occupational at [employer]    Leisure 2 times per week- chair exercise, walk dog 10 blocks 2 times per day      Cognition   Overall Cognitive Status Within Functional Limits for tasks assessed      Observation/Other Assessments   Focus on Therapeutic Outcomes (FOTO)  UIQ-7 29; CRAIQ-7 10; PFIQ-7 34      Posture/Postural Control   Posture/Postural Control Postural limitations    Posture Comments obese      ROM / Strength   AROM / PROM / Strength AROM;PROM;Strength      Palpation   Palpation comment tightness in the suprapubic area                        Objective measurements completed on examination: See above findings.     Pelvic Floor Special Questions - 05/11/21 0001     Prior Pregnancies Yes    Number of Vaginal Deliveries 2    Any difficulty with labor and deliveries Yes   grade 3 tear or 4   Urinary Leakage Yes    Pad use 4 thick pads with folded wash rag in the middle; was rag and 1 pad    Activities that cause leaking With strong urge;Coughing;Sneezing;Lifting;Bending;Walking   recliner then push it into upright then to the bathroom and wet   Urinary frequency after she pumps out the fluid from the lympheddema    Fecal incontinence Yes   strain to have a bowel movment, sometimes with gas leaks stool; stool is type 1 or Type 6   Fluid intake eats ice due to being hot    Skin Integrity Intact    Scar Restricted    Prolapse None    Pelvic Floor Internal Exam Patient confirmis identification adn approves PT to assess pelvic floor and  treatment    Exam Type Vaginal    Palpation tightness along the levator ani, along the perineal body and sides of the introitus  Strength weak squeeze, no lift   2/5 on the right and 3/5 on the left   Strength # of seconds 3              [facility] Adult PT Treatment/Exercise - 05/11/21 0001       Self-Care   Self-Care Other Self-Care Comments    Other Self-Care Comments  instructed patient to use the [employer] Reveleum to the vulva area and anal area for dryness and irritation      Manual Therapy   Manual Therapy Soft tissue mobilization    Soft tissue mobilization manual work to the levator ani and sides of the introitus to work on the mobility of the tissue                     PT Education - 05/11/21 1753     Education Details instructed patient to use the [employer] Reveleum on the vulva and anal area    Person(s) Educated Patient    Methods Explanation    Comprehension Verbalized understanding              PT Short Term Goals - 05/11/21 1801       PT SHORT TERM GOAL #1   Title independent with initial HEP    Time 4    Status New    Target Date 06/08/21      PT SHORT TERM GOAL #2   Title educated on vaginal health    Time 4    Period Weeks    Status New    Target Date 06/08/21      PT SHORT TERM GOAL #3   Title ability to perform diaphragmatic breathing    Time 4    Period Weeks    Status New    Target Date 06/08/21               PT Long Term Goals - 05/11/21 1802       PT LONG TERM GOAL #1   Title independent with advanced HEP for core and pelvic floor    Time 12    Period Weeks    Status New    Target Date 08/03/21      PT LONG TERM GOAL #2   Title ability to not strain when having a bowel movement due ot improved coordination of the pelvic floor    Time 12    Period Weeks    Status New    Target Date 08/03/21      PT LONG TERM GOAL #3   Title pelvic floro strength is >/= 3/5 holding for 10 seconds to reduce  her urinary leakage >/= 75%    Time 12    Period Weeks    Status New    Target Date 08/03/21      PT LONG TERM GOAL #4   Title Stoll leakage  when she is passing gas decreased >/= 75% due to improved coordination of the pelvic floor    Time 12    Period Weeks    Status New    Target Date 08/03/21                    Plan - 05/11/21 1753     Clinical Impression Statement Patient is a 69 year old female with urinary and fecal leakage for several years. She has a history of breast cancer and an dused Tamoxifen. Patient reports itchiness in the vulvar area and anal  area. She has several hemorroids on the anal area. She strains to have a bowel movement and is Type 1 or Type 6. At times when she passes gas she will leak stool. Patient leaks urine with urge to void, bending, sneezing, coughing, walking and lifting. She wears 4 pads plus wash cloth during th eday and 1 pad with wash cloth during the night. Pelvic floor strength is 2/5 on the right and 3/5 on the left. She is able to hold a contraction for 3 seconds. She has difficulty with engaging her abdomen when contracting the pelvic floor. Patient has to wear a pump for her lymphedema and afterwards will urinate frequently du eto the fluid. Patient reports she had a grad 3 tear when she gave birth vaginally. Patient will benefit from skilled therapy to reduce her urinary and fecal leakage.    Personal Factors and Comorbidities Age;Comorbidity 3+;Fitness;Past/Current Experience;Time since onset of injury/illness/exacerbation    Comorbidities Left breast cancer 2011; Fibromyalgia; Abdominal hysterectomy 1986; Bladder tack x2; Raynauds; Lymphedema, Osteoporosis;    Examination-Activity Limitations Continence;Toileting;Stand;Sleep;Caring for Others;Locomotion Level    Examination-Participation Restrictions Community Activity    Stability/Clinical Decision Making Evolving/Moderate complexity    Clinical Decision Making Moderate    Rehab  Potential Excellent    PT Frequency 1x / week    PT Duration 12 weeks    PT Treatment/Interventions ADLs/Self Care Home Management;Biofeedback;Therapeutic activities;Therapeutic exercise;Neuromuscular re-education;Patient/family education;Manual techniques    PT Next Visit Plan see if the [facility] Reveleum is helping; manual work ot the vagina; core engagement; toileting, diahpragmatic breathing    Consulted and Agree with Plan of Care Patient             Patient will benefit from skilled therapeutic intervention in order to improve the following deficits and impairments:  Decreased endurance, Decreased activity tolerance, Decreased strength, Increased fascial restricitons, Decreased coordination  Visit Diagnosis: Muscle weakness (generalized) - Plan: PT plan of care cert/re-cert  Other lack of coordination - Plan: PT plan of care cert/re-cert  Urinary incontinence, unspecified type - Plan: PT plan of care cert/re-cert  Incontinence of feces, unspecified fecal incontinence type - Plan: PT plan of care cert/re-cert     Problem List Patient Active Problem List   Diagnosis Date Noted   Status post total knee replacement using cement, left 09/22/2019   Status post total knee replacement using cement, right 08/12/2018   Rotator cuff rupture    Acquired absence of bilateral breasts and nipples 09/17/2012   Edema 07/27/2011   Breast cancer of lower-outer quadrant of left female breast ([place]) 01/16/2011    Earlie Counts, PT 05/11/21 6:07 PM  [facility] [address], [place], [place], [address] Phone: [phone]   Fax:  [phone]  Name: Krystal [name] MRN: [id number] Date of Birth: 07/09/1951

## 2021-06-01 ENCOUNTER — Encounter: Payer: Medicare HMO | Attending: Adult Health | Admitting: Physical Therapy

## 2021-06-01 ENCOUNTER — Telehealth: Payer: Self-pay | Admitting: Physical Therapy

## 2021-06-01 DIAGNOSIS — R32 Unspecified urinary incontinence: Secondary | ICD-10-CM | POA: Insufficient documentation

## 2021-06-01 DIAGNOSIS — R278 Other lack of coordination: Secondary | ICD-10-CM | POA: Insufficient documentation

## 2021-06-01 DIAGNOSIS — M6281 Muscle weakness (generalized): Secondary | ICD-10-CM | POA: Insufficient documentation

## 2021-06-01 DIAGNOSIS — R159 Full incontinence of feces: Secondary | ICD-10-CM | POA: Insufficient documentation

## 2021-06-01 NOTE — Telephone Encounter (Signed)
Called patient about her missed appointment today at 16:00. Left a message.  Earlie Counts, PT @11 /04/2021@ 4:23 PM

## 2021-06-27 ENCOUNTER — Encounter: Payer: Self-pay | Admitting: Physical Therapy

## 2021-06-27 ENCOUNTER — Encounter: Payer: Medicare HMO | Attending: Adult Health | Admitting: Physical Therapy

## 2021-06-27 ENCOUNTER — Other Ambulatory Visit: Payer: Self-pay

## 2021-06-27 DIAGNOSIS — R278 Other lack of coordination: Secondary | ICD-10-CM | POA: Diagnosis not present

## 2021-06-27 DIAGNOSIS — M6281 Muscle weakness (generalized): Secondary | ICD-10-CM | POA: Diagnosis not present

## 2021-06-27 DIAGNOSIS — R159 Full incontinence of feces: Secondary | ICD-10-CM | POA: Diagnosis not present

## 2021-06-27 DIAGNOSIS — R32 Unspecified urinary incontinence: Secondary | ICD-10-CM | POA: Diagnosis not present

## 2021-06-27 NOTE — Therapy (Signed)
[facility] at [facility] [address], [place], [place], [address] Phone: [phone]   Fax:  [phone]  Physical Therapy Treatment  Patient Details  Name: Krystal [name] MRN: [id number] Date of Birth: Jul 19, 1951 Referring Provider (PT): Gardenia Phlegm, NP   Encounter Date: 06/27/2021   PT End of Session - 06/27/21 0831     Visit Number 2    Date for PT Re-Evaluation 08/03/21    Authorization Type [employer]    Authorization Time Period 10/20-1/06/2022    Authorization - Visit Number 2    Authorization - Number of Visits 12    PT Start Time 0830    PT Stop Time 0935    PT Time Calculation (min) 65 min    Activity Tolerance Patient tolerated treatment well    Behavior During Therapy [facility] for tasks assessed/performed             Past Medical History:  Diagnosis Date   Anemia    Arthritis    osteo   Blindness of right eye at birth   Cancer [facility]) 2011   Left breast 2011 and cervical age 70   Carpal tunnel syndrome    Cataract    Depression    Edema 07/27/2011   Fibromyalgia    muscle weakness and pain   GERD (gastroesophageal reflux disease)    Hyperlipidemia    Hypertension    benign   Lymphedema    Osteoporosis    Plantar fasciitis    Pre-diabetes    Raynaud's disease    Restless leg syndrome    Rosacea    Rotator cuff rupture    Sleep apnea    Synovitis    Ulcer     Past Surgical History:  Procedure Laterality Date   ABDOMINAL HYSTERECTOMY  1986   For bleeding and pain   BLADDER SURGERY  2006   Bladder tack   BREAST RECONSTRUCTION Bilateral 09/17/2012   Procedure: BREAST RECONSTRUCTION;  Surgeon: Theodoro Kos, DO;  Location: [place];  Service: Plastics;  Laterality: Bilateral;  BILATERAL BREAST RECONSTRUCTION WITH TISSUE EXPANDERS AND ALLOMED   BREAST RECONSTRUCTION Right 06/03/2013   Procedure: RIGHT BREAST CAPSULE CONSTRACTURE;  Surgeon: Theodoro Kos, DO;  Location: [place];  Service: Plastics;  Laterality: Right;   CARPAL TUNNEL RELEASE Bilateral 2008   EYE SURGERY Right    INJECTION KNEE Left 08/12/2018   Procedure: KNEE INJECTION-LEFT;  Surgeon: Corky Mull, MD;  Location: [facility];  Service: Orthopedics;  Laterality: Left;   JOINT REPLACEMENT     LIPOSUCTION Bilateral 01/29/2013   Procedure: LIPOSUCTION;  Surgeon: Theodoro Kos, DO;  Location: [place];  Service: Plastics;  Laterality: Bilateral;   LIPOSUCTION Right 06/03/2013   Procedure: LIPOSUCTION;  Surgeon: Theodoro Kos, DO;  Location: [place];  Service: Plastics;  Laterality: Right;   MASTECTOMY  2012   rt prophalactic mast-snbx   MASTECTOMY MODIFIED RADICAL  2012   left-axillary nodes   NEUROMA SURGERY Bilateral    NOSE SURGERY  2007   PORT-A-CATH REMOVAL     insertion and   THROAT SURGERY     TONSILLECTOMY     TOTAL KNEE ARTHROPLASTY Right 08/12/2018   Procedure: TOTAL KNEE ARTHROPLASTY-RIGHT;  Surgeon: Corky Mull, MD;  Location: [facility];  Service: Orthopedics;  Laterality: Right;   TOTAL KNEE ARTHROPLASTY Left 09/22/2019   Procedure: TOTAL KNEE ARTHROPLASTY;  Surgeon: Corky Mull, MD;  Location: [facility];  Service: Orthopedics;  Laterality: Left;   UVULOPALATOPHARYNGOPLASTY      There were no vitals filed for this visit.   Subjective Assessment - 06/27/21 0832     Subjective I am still having the burning vaginally. I have used the lubricant and it has helped.    Patient Stated Goals Work on urinary leakage    Currently in Pain? No/denies    Multiple Pain Sites No                            Pelvic Floor Special Questions - 06/27/21 0001     Pelvic Floor Internal Exam Patient confirmis identification adn approves PT to assess pelvic floor and treatment    Exam Type Vaginal    Palpation tightness along the introitus, post. vaginal canal and urethra sphincter    Strength fair squeeze, definite lift     Strength # of seconds 3               [facility] Adult PT Treatment/Exercise - 06/27/21 0001       Self-Care   Self-Care Other Self-Care Comments    Other Self-Care Comments  gave patient information on using desert harvest reveleum or good clean love with lidocaie to manage the itichiness of the vaginal area; educated patient on vaginal moisturizers and vaginal health to reduce her itchiness and dryness of the vulvar area; educated patient on how to tstilulate the lymphatic system since she has so much swelling      Therapeutic Activites    Therapeutic Activities Other Therapeutic Activities    Other Therapeutic Activities educated patient on how to toilet correctly with breath, spinal movements, relaxation of the mouth and anus.      Neuro Re-ed    Neuro Re-ed Details  mnaual stimulation of the pelvic floor muscles to correct contract      Manual Therapy   Manual Therapy Internal Pelvic Floor;Myofascial release    Myofascial Release release of the urogenital diaphgram with one hand on each side going through the restrictions    Internal Pelvic Floor fascial release around the urethra sphincter while holding ontot the bladder, manual work tot he posterior vaginal canal, perineal body, andalong the introitus                     PT Education - 06/27/21 0942     Education Details Access Code: [email or web address]; instructed patient on correct toileting, how to stimulate the lymphatic system, vaginal moisturizers    Person(s) Educated Patient    Methods Explanation;Demonstration;Verbal cues;Handout    Comprehension Returned demonstration;Verbalized understanding              PT Short Term Goals - 06/27/21 0958       PT SHORT TERM GOAL #2   Title educated on vaginal health    Time 4    Period Weeks    Status Achieved               PT Long Term Goals - 05/11/21 1802       PT LONG TERM GOAL #1   Title independent with advanced HEP for core and pelvic floor    Time  12    Period Weeks    Status New    Target Date 08/03/21      PT LONG TERM GOAL #2   Title ability to not strain when having a bowel movement due ot improved coordination of  the pelvic floor    Time 12    Period Weeks    Status New    Target Date 08/03/21      PT LONG TERM GOAL #3   Title pelvic floro strength is >/= 3/5 holding for 10 seconds to reduce her urinary leakage >/= 75%    Time 12    Period Weeks    Status New    Target Date 08/03/21      PT LONG TERM GOAL #4   Title Stoll leakage  when she is passing gas decreased >/= 75% due to improved coordination of the pelvic floor    Time 12    Period Weeks    Status New    Target Date 08/03/21                   Plan - 06/27/21 0945     Clinical Impression Statement Patient pelvic floor strength increased to 3/5 holding for 3 seconds after manual work to the perineal area. She has tightness in the perineal body, along the intoritus, Patient was able to feel her anus relax when she did the correct breathing for toileting. Patient has increased swelling throughout her body and learned how to stimulate her lymphatic drainage to assist. She does use sleeves for her arms and legs. Patient was able to engage the abdomen better today and felt herself do it. Patient will benefit from skilled therapy to reduce her urinary and fecal leakage.    Personal Factors and Comorbidities Age;Comorbidity 3+;Fitness;Past/Current Experience;Time since onset of injury/illness/exacerbation    Comorbidities Left breast cancer 2011; Fibromyalgia; Abdominal hysterectomy 1986; Bladder tack x2; Raynauds; Lymphedema, Osteoporosis;    Examination-Activity Limitations Continence;Toileting;Stand;Sleep;Caring for Others;Locomotion Level    Examination-Participation Restrictions Community Activity    Stability/Clinical Decision Making Evolving/Moderate complexity    Rehab Potential Excellent    PT Frequency 1x / week    PT Duration 12 weeks    PT  Treatment/Interventions ADLs/Self Care Home Management;Biofeedback;Therapeutic activities;Therapeutic exercise;Neuromuscular re-education;Patient/family education;Manual techniques    PT Next Visit Plan See how toileting is going, see how using a mositurizer is helping,, inferior pelvic decompression in prone, Cisternia Chyli for drainage with hand in midline along the rectus, internal work for mobility of tissue; abdominal work    PT Home Exercise Plan Access Code: [email or web address]    Recommended Other Services MD signed initial summary    Consulted and Agree with Plan of Care Patient             Patient will benefit from skilled therapeutic intervention in order to improve the following deficits and impairments:  Decreased endurance, Decreased activity tolerance, Decreased strength, Increased fascial restricitons, Decreased coordination  Visit Diagnosis: Muscle weakness (generalized)  Other lack of coordination  Urinary incontinence, unspecified type  Incontinence of feces, unspecified fecal incontinence type     Problem List Patient Active Problem List   Diagnosis Date Noted   Status post total knee replacement using cement, left 09/22/2019   Status post total knee replacement using cement, right 08/12/2018   Rotator cuff rupture    Acquired absence of bilateral breasts and nipples 09/17/2012   Edema 07/27/2011   Breast cancer of lower-outer quadrant of left female breast ([place]) 01/16/2011    Earlie Counts, PT 06/27/21 9:59 AM  [facility] [address], [place], [place], [address] Phone: [phone]   Fax:  [phone]  Name: AYAH COZZOLINO MRN: [id number] Date of Birth: 1951/04/10

## 2021-06-27 NOTE — Patient Instructions (Addendum)
Moisturizers They are used in the vagina to hydrate the mucous membrane that make up the vaginal canal. Designed to keep a more normal acid balance (ph) Once placed in the vagina, it will last between two to three days.  Use 2-3 times per week at bedtime  Ingredients to avoid is glycerin and fragrance, can increase chance of infection Should not be used just before sex due to causing irritation Most are gels administered either in a tampon-shaped applicator or as a vaginal suppository. They are non-hormonal.   Types of Moisturizers(internal use) use at the end of the night  Vitamin E vaginal suppositories- Whole foods, Amazon Moist Again Coconut oil- can break down condoms Julva- (Do no use if on Tamoxifen) amazon Yes moisturizer- [employer] , NeuEve Silver for menopausal or over 65 (if have severe vaginal atrophy or cancer treatments use NeuEve Silk for  1 month than move to [employer] Pepsi)- [employer], [place].com Olive and Bee intimate cream- www.oliveandbee.com.au Mae vaginal moisturizer- Amazon Aloe https://griffin-arnold.[email or web address]/products/natural-vaginal-cream   Creams to use externally on the Vulva area Albertson's (good for for cancer patients that had radiation to the area)- [place] or Danaher Corporation.FlyingBasics.com.br V-magic cream - amazon Julva-amazon Vital "V Wild Yam salve ( help moisturize and help with thinning vulvar area, does have [place] by Damiva labial moisturizer (Amazon,  Coconut or olive oil Aloe https://griffin-arnold.[email or web address]/products/natural-vaginal-cream   Things to avoid in the vaginal area Do not use things to irritate the vulvar area No lotions just specialized creams for the vulva area- Neogyn, V-magic, No soaps; can use Aveeno or Calendula cleanser if needed. Must be gentle No deodorants No douches Good to sleep without underwear to let the vaginal area to air out, lay  down with no underwear for 30 minutes No scrubbing: spread the lips to let warm water rinse over labias and pat dry  Use the moisturizers around the anus and into the anus  up to the first bend of your pinky   Call doctor to see if estrace cream would be something you could use in the vaginal canal and around the vulva  Toileting Techniques for Bowel Movements (Defecation) Using your belly (abdomen) and pelvic floor muscles to have a bowel movement is usually instinctive.  Sometimes people can have problems with these muscles and have to relearn proper defecation (emptying) techniques.  If you have weakness in your muscles, organs that are falling out, decreased sensation in your pelvis, or ignore your urge to go, you may find yourself straining to have a bowel movement.  You are straining if you are: holding your breath or taking in a huge gulp of air and holding it  keeping your lips and jaw tensed and closed tightly turning red in the face because of excessive pushing or forcing developing or worsening your  hemorrhoids getting faint while pushing not emptying completely and have to defecate many times a day  If you are straining, you are actually making it harder for yourself to have a bowel movement.  Many people find they are pulling up with the pelvic floor muscles and closing off instead of opening the anus. Due to lack pelvic floor relaxation and coordination the abdominal muscles, one has to work harder to push the feces out.  Many people have never been taught how to defecate efficiently and effectively.  Notice what happens to your body when you are having a bowel movement.  While you are sitting  on the toilet pay attention to the following areas: Jaw and mouth position Angle of your hips   Whether your feet touch the ground or not Arm placement  Spine position Waist Belly tension Anus (opening of the anal canal)  An Evacuation/Defecation Plan   Here are the 4 basic  points:  Lean forward enough for your elbows to rest on your knees Support your feet on the floor or use a low stool if your feet don't touch the floor  Push out your belly as if you have swallowed a beach ball--you should feel a widening of your waist Open and relax your pelvic floor muscles, rather than tightening around the anus       The following conditions my require modifications to your toileting posture:  If you have had surgery in the past that limits your back, hip, pelvic, knee or ankle flexibility Constipation   Your healthcare practitioner may make the following additional suggestions and adjustments:  Sit on the toilet  a) Make sure your feet are supported. b) Notice your hip angle and spine position--most people find it effective to lean forward or raise their knees, which can help the muscles around the anus to relax  c) When you lean forward, place your forearms on your thighs for support  Relax suggestions a) Breath deeply in through your nose and out slowly through your mouth as if you are smelling the flowers and blowing out the candles. b) To become aware of how to relax your muscles, contracting and releasing muscles can be helpful.  Pull your pelvic floor muscles in tightly by using the image of holding back gas, or closing around the anus (visualize making a circle smaller) and lifting the anus up and in.  Then release the muscles and your anus should drop down and feel open. Repeat 5 times ending with the feeling of relaxation. c) Keep your pelvic floor muscles relaxed; let your belly bulge out. d) The digestive tract starts at the mouth and ends at the anal opening, so be sure to relax both ends of the tube.  Place your tongue on the roof of your mouth with your teeth separated.  This helps relax your mouth and will help to relax the anus at the same time.  Empty (defecation) a) Keep your pelvic floor and sphincter relaxed, then bulge your anal muscles.  Make  the anal opening wide.  b) Stick your belly out as if you have swallowed a beach ball. c) Make your belly wall hard using your belly muscles while continuing to breathe. Doing this makes it easier to open your anus. d) Breath out and give a grunt (or try using other sounds such as ahhhh, shhhhh, ohhhh or grrrrrrr).  4) Finish a) As you finish your bowel movement, pull the pelvic floor muscles up and in.  This will leave your anus in the proper place rather than remaining pushed out and down. If you leave your anus pushed out and down, it will start to feel as though that is normal and give you incorrect signals about needing to have a bowel movement.  Access Code: [email or web address] URL: [email or web address] Date: 06/27/2021 Prepared by: Earlie Counts  Exercises Seated Pelvic Floor Contraction - 3 x daily - 7 x weekly - 1 sets - 10 reps - 5 sec hold   Earlie Counts, PT [facility] [address] [place], [place] [address] W: [phone] [email or web address]

## 2021-06-29 DIAGNOSIS — E6609 Other obesity due to excess calories: Secondary | ICD-10-CM | POA: Diagnosis not present

## 2021-06-29 DIAGNOSIS — E785 Hyperlipidemia, unspecified: Secondary | ICD-10-CM | POA: Diagnosis not present

## 2021-06-29 DIAGNOSIS — I10 Essential (primary) hypertension: Secondary | ICD-10-CM | POA: Diagnosis not present

## 2021-06-29 DIAGNOSIS — I890 Lymphedema, not elsewhere classified: Secondary | ICD-10-CM | POA: Diagnosis not present

## 2021-06-29 DIAGNOSIS — I7300 Raynaud's syndrome without gangrene: Secondary | ICD-10-CM | POA: Diagnosis not present

## 2021-06-29 DIAGNOSIS — I208 Other forms of angina pectoris: Secondary | ICD-10-CM | POA: Diagnosis not present

## 2021-06-29 DIAGNOSIS — Z6833 Body mass index (BMI) 33.0-33.9, adult: Secondary | ICD-10-CM | POA: Diagnosis not present

## 2021-07-04 ENCOUNTER — Encounter: Payer: Medicare HMO | Admitting: Physical Therapy

## 2021-07-04 ENCOUNTER — Other Ambulatory Visit: Payer: Self-pay

## 2021-07-04 ENCOUNTER — Encounter: Payer: Self-pay | Admitting: Physical Therapy

## 2021-07-04 DIAGNOSIS — M6281 Muscle weakness (generalized): Secondary | ICD-10-CM

## 2021-07-04 DIAGNOSIS — R159 Full incontinence of feces: Secondary | ICD-10-CM | POA: Diagnosis not present

## 2021-07-04 DIAGNOSIS — R278 Other lack of coordination: Secondary | ICD-10-CM

## 2021-07-04 DIAGNOSIS — R32 Unspecified urinary incontinence: Secondary | ICD-10-CM | POA: Diagnosis not present

## 2021-07-04 NOTE — Therapy (Signed)
[facility] [address] [place], [place], [address] Phone: [phone]   Fax:  [phone]  Physical Therapy Treatment  Patient Details  Name: Krystal [name] MRN: [id number] Date of Birth: 1951-02-06 Referring Provider (PT): Gardenia Phlegm, NP   Encounter Date: 07/04/2021   PT End of Session - 07/04/21 1045     Visit Number 3    Date for PT Re-Evaluation 08/03/21    Authorization Type [employer]    Authorization Time Period 10/20-1/06/2022    Authorization - Visit Number 4    Authorization - Number of Visits 12    PT Start Time 2683   came late due to oversleeping   PT Stop Time 0928    PT Time Calculation (min) 38 min    Activity Tolerance Patient tolerated treatment well    Behavior During Therapy [facility] for tasks assessed/performed             Past Medical History:  Diagnosis Date   Anemia    Arthritis    osteo   Blindness of right eye at birth   Cancer [facility]) 2011   Left breast 2011 and cervical age 61   Carpal tunnel syndrome    Cataract    Depression    Edema 07/27/2011   Fibromyalgia    muscle weakness and pain   GERD (gastroesophageal reflux disease)    Hyperlipidemia    Hypertension    benign   Lymphedema    Osteoporosis    Plantar fasciitis    Pre-diabetes    Raynaud's disease    Restless leg syndrome    Rosacea    Rotator cuff rupture    Sleep apnea    Synovitis    Ulcer     Past Surgical History:  Procedure Laterality Date   ABDOMINAL HYSTERECTOMY  1986   For bleeding and pain   BLADDER SURGERY  2006   Bladder tack   BREAST RECONSTRUCTION Bilateral 09/17/2012   Procedure: BREAST RECONSTRUCTION;  Surgeon: Theodoro Kos, DO;  Location: [place];  Service: Plastics;  Laterality: Bilateral;  BILATERAL BREAST RECONSTRUCTION WITH TISSUE EXPANDERS AND ALLOMED   BREAST RECONSTRUCTION Right 06/03/2013   Procedure: RIGHT BREAST CAPSULE CONSTRACTURE;  Surgeon:  Theodoro Kos, DO;  Location: [place];  Service: Plastics;  Laterality: Right;   CARPAL TUNNEL RELEASE Bilateral 2008   EYE SURGERY Right    INJECTION KNEE Left 08/12/2018   Procedure: KNEE INJECTION-LEFT;  Surgeon: Corky Mull, MD;  Location: [facility];  Service: Orthopedics;  Laterality: Left;   JOINT REPLACEMENT     LIPOSUCTION Bilateral 01/29/2013   Procedure: LIPOSUCTION;  Surgeon: Theodoro Kos, DO;  Location: [place];  Service: Plastics;  Laterality: Bilateral;   LIPOSUCTION Right 06/03/2013   Procedure: LIPOSUCTION;  Surgeon: Theodoro Kos, DO;  Location: [place];  Service: Plastics;  Laterality: Right;   MASTECTOMY  2012   rt prophalactic mast-snbx   MASTECTOMY MODIFIED RADICAL  2012   left-axillary nodes   NEUROMA SURGERY Bilateral    NOSE SURGERY  2007   PORT-A-CATH REMOVAL     insertion and   THROAT SURGERY     TONSILLECTOMY     TOTAL KNEE ARTHROPLASTY Right 08/12/2018   Procedure: TOTAL KNEE ARTHROPLASTY-RIGHT;  Surgeon: Corky Mull, MD;  Location: [facility];  Service: Orthopedics;  Laterality: Right;   TOTAL KNEE ARTHROPLASTY Left 09/22/2019   Procedure: TOTAL KNEE ARTHROPLASTY;  Surgeon: Roland Rack,  Marshall Cork, MD;  Location: [facility];  Service: Orthopedics;  Laterality: Left;   UVULOPALATOPHARYNGOPLASTY      There were no vitals filed for this visit.   Subjective Assessment - 07/04/21 0855     Subjective I have been doing my exercises. I wear no underwear at night. I am leaking the same amount of urine. Stool getting softer and not as hard to push. Using the squatty potty is no different.    Patient Stated Goals Work on urinary leakage    Currently in Pain? No/denies    Multiple Pain Sites No                               [facility] Adult PT Treatment/Exercise - 07/04/21 0001       Therapeutic Activites    Therapeutic Activities Other Therapeutic Activities    Other Therapeutic Activities getting up  from supine poistions with abdominal bracing and pelvic floor contraction to reduce urinary leakage and doming of her abdomen      Neuro Re-ed    Neuro Re-ed Details  contraction of the abdominals with pelvic floor; working on diaphragmatic breathing but difficult due ot increased abdominal mass and tightness with the swelling.      Manual Therapy   Manual Therapy Soft tissue mobilization;Myofascial release    Soft tissue mobilization circular massage tto the abdomen to promote peristalic motions of the intestines for bowel movements.    Myofascial Release release suprapubicall around the bladder; release around the left abdominal regions to reduce restrictions of the kidney and ureters; relesae of the left lower quadrant with patient moving her left knee in and out                     PT Education - 07/04/21 1044     Education Details moving in bed with abdominal bracing and pelvic floor contraction    Person(s) Educated Patient    Methods Explanation;Demonstration    Comprehension Verbalized understanding;Returned demonstration              PT Short Term Goals - 07/04/21 0858       PT SHORT TERM GOAL #1   Title independent with initial HEP    Time 4    Period Weeks    Status Achieved    Target Date 06/08/21      PT SHORT TERM GOAL #2   Title educated on vaginal health    Time 4    Period Weeks    Status Achieved      PT SHORT TERM GOAL #3   Title ability to perform diaphragmatic breathing    Time 4    Period Weeks    Status Achieved               PT Long Term Goals - 05/11/21 1802       PT LONG TERM GOAL #1   Title independent with advanced HEP for core and pelvic floor    Time 12    Period Weeks    Status New    Target Date 08/03/21      PT LONG TERM GOAL #2   Title ability to not strain when having a bowel movement due ot improved coordination of the pelvic floor    Time 12    Period Weeks    Status New    Target Date 08/03/21       PT  LONG TERM GOAL #3   Title pelvic floro strength is >/= 3/5 holding for 10 seconds to reduce her urinary leakage >/= 75%    Time 12    Period Weeks    Status New    Target Date 08/03/21      PT LONG TERM GOAL #4   Title Stoll leakage  when she is passing gas decreased >/= 75% due to improved coordination of the pelvic floor    Time 12    Period Weeks    Status New    Target Date 08/03/21                   Plan - 07/04/21 6811     Clinical Impression Statement Patient is able to have a bowel movement with greater ease and the stool is softer. The vaginal area is not as dry and the vaginal area feels better. Patient has a reduction of swelling in her abdoment after manual work and able to urinate without pain and slowly. She had fascial restrictions in the suprapubic area and left abdominal region. Patient is able to contract her lower abdomen better with pelvic floor contraction. Patient will benefit from skilled therapy to work on pelvic floro strength and coordination to reduce urine flow.    Personal Factors and Comorbidities Age;Comorbidity 3+;Fitness;Past/Current Experience;Time since onset of injury/illness/exacerbation    Comorbidities Left breast cancer 2011; Fibromyalgia; Abdominal hysterectomy 1986; Bladder tack x2; Raynauds; Lymphedema, Osteoporosis;    Examination-Activity Limitations Continence;Toileting;Stand;Sleep;Caring for Others;Locomotion Level    Examination-Participation Restrictions Community Activity    Stability/Clinical Decision Making Evolving/Moderate complexity    Rehab Potential Excellent    PT Frequency 1x / week    PT Duration 12 weeks    PT Treatment/Interventions ADLs/Self Care Home Management;Biofeedback;Therapeutic activities;Therapeutic exercise;Neuromuscular re-education;Patient/family education;Manual techniques    PT Next Visit Plan inferior pelvic decompression in prone, Cisternia Chyli for drainage with hand in midline along the rectus,  internal work for mobility of tissue; abdominal work to Soil scientist; adk about stool leakage when pass gas    PT Home Exercise Plan Access Code: [email or web address]    Consulted and Agree with Plan of Care Patient             Patient will benefit from skilled therapeutic intervention in order to improve the following deficits and impairments:  Decreased endurance, Decreased activity tolerance, Decreased strength, Increased fascial restricitons, Decreased coordination  Visit Diagnosis: Muscle weakness (generalized)  Other lack of coordination  Urinary incontinence, unspecified type  Incontinence of feces, unspecified fecal incontinence type     Problem List Patient Active Problem List   Diagnosis Date Noted   Status post total knee replacement using cement, left 09/22/2019   Status post total knee replacement using cement, right 08/12/2018   Rotator cuff rupture    Acquired absence of bilateral breasts and nipples 09/17/2012   Edema 07/27/2011   Breast cancer of lower-outer quadrant of left female breast ([place]) 01/16/2011    Earlie Counts, PT 07/04/21 10:48 AM  [facility] [address], [place], [place], [address] Phone: [phone]   Fax:  [phone]  Name: AILEANA HODDER MRN: [id number] Date of Birth: Sep 29, 1950

## 2021-07-11 ENCOUNTER — Other Ambulatory Visit: Payer: Self-pay

## 2021-07-11 ENCOUNTER — Ambulatory Visit: Payer: Medicare HMO | Admitting: Physical Therapy

## 2021-07-11 ENCOUNTER — Encounter: Payer: Self-pay | Admitting: Physical Therapy

## 2021-07-11 DIAGNOSIS — R159 Full incontinence of feces: Secondary | ICD-10-CM

## 2021-07-11 DIAGNOSIS — R278 Other lack of coordination: Secondary | ICD-10-CM

## 2021-07-11 DIAGNOSIS — R32 Unspecified urinary incontinence: Secondary | ICD-10-CM

## 2021-07-11 DIAGNOSIS — M6281 Muscle weakness (generalized): Secondary | ICD-10-CM | POA: Diagnosis not present

## 2021-07-11 NOTE — Patient Instructions (Addendum)
°  Lubrication Used for intercourse to reduce friction Avoid ones that have glycerin, warming gels, tingling gels, icing or cooling gel, scented Avoid parabens due to a preservative similar to female sex hormone May need to be reapplied once or several times during sexual activity Can be applied to both partners genitals prior to vaginal penetration to minimize friction or irritation Prevent irritation and mucosal tears that cause post coital pain and increased the risk of vaginal and urinary tract infections Oil-based lubricants cannot be used with condoms due to breaking them down.  Least likely to irritate vaginal tissue.  Plant based-lubes are safe Silicone-based lubrication are thicker and last long and used for post-menopausal women  Vaginal Lubricators Here is a list of some suggested lubricators you can use for intercourse. Use the most hypoallergenic product.  You can place on you or your partner.  Slippery Stuff ( water based) Sylk or Sliquid Natural H2O ( good  if frequent UTIs)- walmart, amazon Sliquid organics silk-(aloe and silicone based ) [employer] (www.blossom-organics.com)- (aloe based ) Coconut oil, olive oil -not good with condoms  PJur Woman Nude- (water based) amazon Uberlube- ( silicon) Varina has an organic one Yes lubricant- (water based and has plant oil based similar to silicone) [employer] Platinum-Silicone, Target, [facility]reens Olive and Bee intimate cream-  www.oliveandbee.com.au Pink - Stryker Corporation stuff Erosense Sync- walmart, amazon Coconu- FailLinks.co.uk  Things to avoid in lubricants are glycerin, warming gels, tingling gels, icing or cooling  gels, and scented gels.  Also avoid Vaseline. KY jelly, Replens, and Astroglide contain chlorhexidine which kills good bacteria(lactobacilli)  Things to avoid in the vaginal area Do not use things to irritate the vulvar area No lotions- see below Soaps you  can use :Aveeno, Calendula, Good  Clean Love cleanser if needed. Must be gentle No deodorants No douches Good to sleep without underwear to let the vaginal area to air out No scrubbing: spread the lips to let warm water rinse over labias and pat dry  Creams that can be used on the Vulva Area V [employer], walmart Vital V Wild Yam Salve Julva- [employer] Botanical Pro-Meno Wild Yam Cream Coconut oil, olive oil Cleo by [employer] labial moisturizer -Amazon,  [place] Releveum ( lidocaine) or Desert [employer] Yes Moisturizer    Use coconut oil daily in the vaginal area and use a a lubricant.   Access Code: [email or web address] URL: [email or web address] Date: 07/11/2021 Prepared by: Earlie Counts  Exercises Seated Pelvic Floor Contraction - 3 x daily - 7 x weekly - 1 sets - 10 reps - 5 sec hold Supine Transversus Abdominis Bracing - Hands on Stomach - 1 x daily - 7 x weekly - 1 sets - 10 reps Sidelying Transversus Abdominis Bracing - 1 x daily - 7 x weekly - 1 sets - 10 reps Sit to Stand with Pelvic Floor Contraction - 1 x daily - 7 x weekly - 1 sets - 10 reps  Earlie Counts, PT [facility] [address] [place], [place] [address] W: [phone] [email or web address]

## 2021-07-11 NOTE — Therapy (Signed)
[facility] at [facility] [address], [place], [place], [address] Phone: [phone]   Fax:  [phone]  Physical Therapy Treatment  Patient Details  Name: Krystal [name] MRN: [id number] Date of Birth: 1950-08-20 Referring Provider (PT): Gardenia Phlegm, NP   Encounter Date: 07/11/2021   PT End of Session - 07/11/21 0835     Visit Number 4    Date for PT Re-Evaluation 08/03/21    Authorization Type [employer]    Authorization Time Period 10/20-1/06/2022    Authorization - Visit Number 5    Authorization - Number of Visits 12    PT Start Time 0830    PT Stop Time 0915    PT Time Calculation (min) 45 min             Past Medical History:  Diagnosis Date   Anemia    Arthritis    osteo   Blindness of right eye at birth   Cancer [facility]) 2011   Left breast 2011 and cervical age 26   Carpal tunnel syndrome    Cataract    Depression    Edema 07/27/2011   Fibromyalgia    muscle weakness and pain   GERD (gastroesophageal reflux disease)    Hyperlipidemia    Hypertension    benign   Lymphedema    Osteoporosis    Plantar fasciitis    Pre-diabetes    Raynaud's disease    Restless leg syndrome    Rosacea    Rotator cuff rupture    Sleep apnea    Synovitis    Ulcer     Past Surgical History:  Procedure Laterality Date   ABDOMINAL HYSTERECTOMY  1986   For bleeding and pain   BLADDER SURGERY  2006   Bladder tack   BREAST RECONSTRUCTION Bilateral 09/17/2012   Procedure: BREAST RECONSTRUCTION;  Surgeon: Theodoro Kos, DO;  Location: [place];  Service: Plastics;  Laterality: Bilateral;  BILATERAL BREAST RECONSTRUCTION WITH TISSUE EXPANDERS AND ALLOMED   BREAST RECONSTRUCTION Right 06/03/2013   Procedure: RIGHT BREAST CAPSULE CONSTRACTURE;  Surgeon: Theodoro Kos, DO;  Location: [place];  Service: Plastics;  Laterality: Right;   CARPAL TUNNEL RELEASE Bilateral 2008   EYE SURGERY  Right    INJECTION KNEE Left 08/12/2018   Procedure: KNEE INJECTION-LEFT;  Surgeon: Corky Mull, MD;  Location: [facility];  Service: Orthopedics;  Laterality: Left;   JOINT REPLACEMENT     LIPOSUCTION Bilateral 01/29/2013   Procedure: LIPOSUCTION;  Surgeon: Theodoro Kos, DO;  Location: [place];  Service: Plastics;  Laterality: Bilateral;   LIPOSUCTION Right 06/03/2013   Procedure: LIPOSUCTION;  Surgeon: Theodoro Kos, DO;  Location: [place];  Service: Plastics;  Laterality: Right;   MASTECTOMY  2012   rt prophalactic mast-snbx   MASTECTOMY MODIFIED RADICAL  2012   left-axillary nodes   NEUROMA SURGERY Bilateral    NOSE SURGERY  2007   PORT-A-CATH REMOVAL     insertion and   THROAT SURGERY     TONSILLECTOMY     TOTAL KNEE ARTHROPLASTY Right 08/12/2018   Procedure: TOTAL KNEE ARTHROPLASTY-RIGHT;  Surgeon: Corky Mull, MD;  Location: [facility];  Service: Orthopedics;  Laterality: Right;   TOTAL KNEE ARTHROPLASTY Left 09/22/2019   Procedure: TOTAL KNEE ARTHROPLASTY;  Surgeon: Corky Mull, MD;  Location: [facility];  Service: Orthopedics;  Laterality: Left;   UVULOPALATOPHARYNGOPLASTY      There were no vitals  filed for this visit.   Subjective Assessment - 07/11/21 0838     Subjective I used K y Jelly I have been going without underwear and my perineal area is feeling better.    Patient Stated Goals Work on urinary leakage    Currently in Pain? 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, occupational at [employer] center    Leisure 2 times per week- chair exercise, walk dog 10 blocks 2 times per day      Cognition   Overall Cognitive Status Within Functional Limits for tasks assessed      Observation/Other Assessments   Focus on Therapeutic Outcomes (FOTO)  UIQ-7 29; CRAIQ-7 10; PFIQ-7 34      Palpation   Palpation comment tightness in the suprapubic area                        Pelvic Floor Special Questions - 07/11/21 0001     Number of Vaginal Deliveries 2    Any difficulty with labor and deliveries Yes   grade 3 tear or 4   Pad use 1 thick pad per day               [facility] Adult PT Treatment/Exercise - 07/11/21 0001       Self-Care   Self-Care Other Self-Care Comments    Other Self-Care Comments  discussed withpatient on vaginal lubricants and moisturizers and which ones are appropriate for the vaginal tissue      Lumbar Exercises: Seated   Sit to Stand 10 reps    Sit to Stand Limitations with pelvic floor contraction and working on control      Lumbar Exercises: Supine   Ab Set 10 reps;1 second    AB Set Limitations with breath and tactile cues      Lumbar Exercises: Sidelying   Other Sidelying Lumbar Exercises sidely transverse abdominalus contraction with breath      Manual Therapy   Manual Therapy Soft tissue mobilization;Myofascial release    Soft tissue mobilization circular massage tto the abdomen to promote peristalic motions of the intestines for bowel movements.    Myofascial Release [employer] for drainage; release around the bladder in supine and sidely; quick press on the abdomen to stilumulate abdominal drainage                     PT Education - 07/11/21 0922     Education Details Access Code: [email or web address]; educated patient on lubricants and vaginal moisturizers    Person(s) Educated Patient    Methods  Explanation;Demonstration;Handout    Comprehension Verbalized understanding;Returned demonstration              PT Short Term Goals - 07/04/21 [phone]  PT SHORT TERM GOAL #1   Title independent with initial HEP    Time 4    Period Weeks    Status Achieved    Target Date 06/08/21      PT SHORT TERM GOAL #2   Title educated on vaginal health    Time 4    Period Weeks    Status Achieved      PT SHORT TERM GOAL #3   Title ability to perform diaphragmatic breathing    Time 4    Period Weeks    Status Achieved               PT Long Term Goals - 07/11/21 0845       PT LONG TERM GOAL #1   Title independent with advanced HEP for core and pelvic floor    Time 12    Period Weeks    Status On-going      PT LONG TERM GOAL #2   Title ability to not strain when having a bowel movement due ot improved coordination of the pelvic floor    Baseline straining is 50% better.    Time 12    Period Weeks    Status On-going      PT LONG TERM GOAL #3   Title pelvic floro strength is >/= 3/5 holding for 10 seconds to reduce her urinary leakage >/= 75%    Time 12    Period Weeks    Status On-going      PT LONG TERM GOAL #4   Title Stoll leakage  when she is passing gas decreased >/= 75% due to improved coordination of the pelvic floor    Baseline leakage is 25% better.    Time 12    Period Weeks    Status On-going                   Plan - 07/11/21 4742     Clinical Impression Statement Patient is using 1 thick pad instead of 4 due to reduction of urinary leakage. She is straining 50% less with bowel movement. Stool leakage is 25% better. She is able to brace her abdominals wiht breath. Patient has increased fascial movement around the bladder and suprapubically. Patient reports the vaginal area feel shealthyier with her giving the area time without underwear. Patient will benefit from skilled therapy to work on pelvic floor strength and coordinaiton to reduce  urine flow.    Personal Factors and Comorbidities Age;Comorbidity 3+;Fitness;Past/Current Experience;Time since onset of injury/illness/exacerbation    Comorbidities Left breast cancer 2011; Fibromyalgia; Abdominal hysterectomy 1986; Bladder tack x2; Raynauds; Lymphedema, Osteoporosis;    Examination-Activity Limitations Continence;Toileting;Stand;Sleep;Caring for Others;Locomotion Level    Examination-Participation Restrictions Community Activity    Stability/Clinical Decision Making Evolving/Moderate complexity    Rehab Potential Excellent    PT Frequency 1x / week    PT Duration 12 weeks    PT Treatment/Interventions ADLs/Self Care Home Management;Biofeedback;Therapeutic activities;Therapeutic exercise;Neuromuscular re-education;Patient/family education;Manual techniques    PT Next Visit Plan , internal work for mobility of tissue; abdominal work to contract with supine press hand into knees, sitting with andti rotational movement    PT Home Exercise Plan Access Code: [email or web address]    Consulted and Agree with Plan of Care Patient             Patient will benefit from skilled therapeutic intervention in order to improve the following deficits and impairments:  Decreased endurance, Decreased activity tolerance, Decreased strength, Increased fascial restricitons,  Decreased coordination  Visit Diagnosis: Muscle weakness (generalized)  Other lack of coordination  Urinary incontinence, unspecified type  Incontinence of feces, unspecified fecal incontinence type     Problem List Patient Active Problem List   Diagnosis Date Noted   Status post total knee replacement using cement, left 09/22/2019   Status post total knee replacement using cement, right 08/12/2018   Rotator cuff rupture    Acquired absence of bilateral breasts and nipples 09/17/2012   Edema 07/27/2011   Breast cancer of lower-outer quadrant of left female breast ([place]) 01/16/2011    Earlie Counts, PT 07/11/21 9:31  AM   [place] [facility] [address] [place], [place], [address] Phone: [phone]   Fax:  [phone]  Name: Krystal [name] MRN: [id number] Date of Birth: 1951-04-06

## 2021-07-18 ENCOUNTER — Encounter: Payer: Medicare HMO | Admitting: Physical Therapy

## 2021-07-20 ENCOUNTER — Encounter: Payer: Medicare HMO | Admitting: Physical Therapy

## 2021-07-20 ENCOUNTER — Other Ambulatory Visit: Payer: Self-pay

## 2021-07-20 ENCOUNTER — Encounter: Payer: Self-pay | Admitting: Physical Therapy

## 2021-07-20 DIAGNOSIS — R159 Full incontinence of feces: Secondary | ICD-10-CM

## 2021-07-20 DIAGNOSIS — R278 Other lack of coordination: Secondary | ICD-10-CM

## 2021-07-20 DIAGNOSIS — M6281 Muscle weakness (generalized): Secondary | ICD-10-CM

## 2021-07-20 DIAGNOSIS — R32 Unspecified urinary incontinence: Secondary | ICD-10-CM | POA: Diagnosis not present

## 2021-07-20 NOTE — Patient Instructions (Signed)
Access Code: [email or web address] URL: [email or web address] Date: 07/20/2021 Prepared by: Earlie Counts  Exercises Seated Pelvic Floor Contraction - 3 x daily - 7 x weekly - 1 sets - 10 reps - 5 sec hold Supine Transversus Abdominis Bracing - Hands on Stomach - 1 x daily - 7 x weekly - 1 sets - 10 reps Sit to Stand with Pelvic Floor Contraction - 1 x daily - 7 x weekly - 1 sets - 10 reps Hooklying Isometric Hip Flexion - 1 x daily - 3 x weekly - 2 sets - 5 reps - 5 seconds hold Sidelying Diaphragmatic Breathing - 1 x daily - 3 x weekly - 2 sets - 10 reps Standing Anti-Rotation Press with Anchored Resistance - 1 x daily - 3 x weekly - 2 sets - 10 reps Deadlift with Resistance - 1 x daily - 3 x weekly - 2 sets - 10 reps [facility] [address] [place], [place] [address] Phone # [phone] Fax [phone]

## 2021-07-20 NOTE — Therapy (Signed)
[facility] at [facility] [address] [place], [place], [address] Phone: [phone]   Fax:  [phone]  Physical Therapy Treatment  Patient Details  Name: Krystal [name] MRN: [id number] Date of Birth: [birth year] Referring Provider (PT): Gardenia Phlegm, NP   Encounter Date: 07/20/2021   PT End of Session - 07/20/21 1351     Visit Number 5    Date for PT Re-Evaluation 08/03/21    Authorization Type [employer]    Authorization Time Period 10/20-1/06/2022    Authorization - Visit Number 5    Authorization - Number of Visits 12    PT Start Time 1300    PT Stop Time 2458    PT Time Calculation (min) 45 min    Activity Tolerance Patient tolerated treatment well    Behavior During Therapy [facility] for tasks assessed/performed             Past Medical History:  Diagnosis Date   Anemia    Arthritis    osteo   Blindness of right eye at birth   Cancer [facility]) 2011   Left breast 2011 and cervical age 72   Carpal tunnel syndrome    Cataract    Depression    Edema 07/27/2011   Fibromyalgia    muscle weakness and pain   GERD (gastroesophageal reflux disease)    Hyperlipidemia    Hypertension    benign   Lymphedema    Osteoporosis    Plantar fasciitis    Pre-diabetes    Raynaud's disease    Restless leg syndrome    Rosacea    Rotator cuff rupture    Sleep apnea    Synovitis    Ulcer     Past Surgical History:  Procedure Laterality Date   ABDOMINAL HYSTERECTOMY  1986   For bleeding and pain   BLADDER SURGERY  2006   Bladder tack   BREAST RECONSTRUCTION Bilateral 09/17/2012   Procedure: BREAST RECONSTRUCTION;  Surgeon: Theodoro Kos, DO;  Location: [place];  Service: Plastics;  Laterality: Bilateral;  BILATERAL BREAST RECONSTRUCTION WITH TISSUE EXPANDERS AND ALLOMED   BREAST RECONSTRUCTION Right 06/03/2013   Procedure: RIGHT BREAST CAPSULE CONSTRACTURE;  Surgeon: Theodoro Kos, DO;  Location:  [place];  Service: Plastics;  Laterality: Right;   CARPAL TUNNEL RELEASE Bilateral 2008   EYE SURGERY Right    INJECTION KNEE Left 08/12/2018   Procedure: KNEE INJECTION-LEFT;  Surgeon: Corky Mull, MD;  Location: [facility];  Service: Orthopedics;  Laterality: Left;   JOINT REPLACEMENT     LIPOSUCTION Bilateral 01/29/2013   Procedure: LIPOSUCTION;  Surgeon: Theodoro Kos, DO;  Location: [place];  Service: Plastics;  Laterality: Bilateral;   LIPOSUCTION Right 06/03/2013   Procedure: LIPOSUCTION;  Surgeon: Theodoro Kos, DO;  Location: [place];  Service: Plastics;  Laterality: Right;   MASTECTOMY  2012   rt prophalactic mast-snbx   MASTECTOMY MODIFIED RADICAL  2012   left-axillary nodes   NEUROMA SURGERY Bilateral    NOSE SURGERY  2007   PORT-A-CATH REMOVAL     insertion and   THROAT SURGERY     TONSILLECTOMY     TOTAL KNEE ARTHROPLASTY Right 08/12/2018   Procedure: TOTAL KNEE ARTHROPLASTY-RIGHT;  Surgeon: Corky Mull, MD;  Location: [facility];  Service: Orthopedics;  Laterality: Right;   TOTAL KNEE ARTHROPLASTY Left 09/22/2019   Procedure: TOTAL KNEE ARTHROPLASTY;  Surgeon: Corky Mull, MD;  Location: [facility];  Service: Orthopedics;  Laterality: Left;   UVULOPALATOPHARYNGOPLASTY      There were no vitals filed for this visit.   Subjective Assessment - 07/20/21 1301     Subjective Dryness has gone away. Going without underwear and is helping. Coconut oil helps. When forget to use the coconut oil on I will have pain with urinate. I have been doing my exercises and the urinary leakage is better. I went out last night and my pad was dry.    Patient Stated Goals Work on urinary leakage    Currently in Pain? No/denies    Multiple Pain Sites No                               [facility] Adult PT Treatment/Exercise - 07/20/21 0001       Neuro Re-ed    Neuro Re-ed Details  sidely with knees to chest and breathing into  back to expand the posterior rib cage on right side and left side      Lumbar Exercises: Standing   Other Standing Lumbar Exercises dead lift with green theraband 10x with tactile cues to relax shoulders andkeep spine extended; standing antirotational exercise with green band and pelvic floor contraction      Lumbar Exercises: Supine   Isometric Hip Flexion 5 reps;5 seconds   left and right   Isometric Hip Flexion Limitations breath out to contract the abdominals      Manual Therapy   Manual Therapy Soft tissue mobilization;Myofascial release    Soft tissue mobilization manual  mobilization to thoracic and lumbar mobilization and quadratus    Myofascial Release using the suction cup on the lumbar and thoracic to improve fascial mobility                     PT Education - 07/20/21 1351     Education Details Access Code: [email or web address]    Person(s) Educated Patient    Methods Explanation;Demonstration;Verbal cues;Handout    Comprehension Returned demonstration;Verbalized understanding              PT Short Term Goals - 07/04/21 0858       PT SHORT TERM GOAL #1   Title independent with initial HEP    Time 4    Period Weeks    Status Achieved    Target Date 06/08/21      PT SHORT TERM GOAL #2   Title educated on vaginal health    Time 4    Period Weeks    Status Achieved      PT SHORT TERM GOAL #3   Title ability to perform diaphragmatic breathing    Time 4    Period Weeks    Status Achieved               PT Long Term Goals - 07/20/21 1356       PT LONG TERM GOAL #1   Title independent with advanced HEP for core and pelvic floor    Time 12    Period Weeks    Status On-going    Target Date 08/03/21      PT LONG TERM GOAL #2   Title ability to not strain when having a bowel movement due ot improved coordination of the pelvic floor    Baseline straining is 50% better.    Time 12    Period Weeks    Status On-going  PT LONG TERM GOAL #3    Title pelvic floro strength is >/= 3/5 holding for 10 seconds to reduce her urinary leakage >/= 75%    Time 12    Period Weeks    Status On-going      PT LONG TERM GOAL #4   Title Stoll leakage  when she is passing gas decreased >/= 75% due to improved coordination of the pelvic floor    Baseline leakage is 25% better.    Time 12    Period Weeks    Status On-going    Target Date 08/03/21                   Plan - 07/20/21 1352     Clinical Impression Statement Patient is able to not wear underwear around the house to air out the perineal area and not worry about leakage. She was able to go out for 6 hours without leaking on her pad. Patient is starting to contract her abdominals minimally. She has increased tightness in the thoracic lumbar area making it difficult for the rib cage to move. After manual work she had increased movement. She was able to go from supin to sitting with reduction of doming in her abdominals. Patient will benefit from skilled therapy to work on pelvic floor strength and coordination to reduce urine flow.    Personal Factors and Comorbidities Age;Comorbidity 3+;Fitness;Past/Current Experience;Time since onset of injury/illness/exacerbation    Comorbidities Left breast cancer 2011; Fibromyalgia; Abdominal hysterectomy 1986; Bladder tack x2; Raynauds; Lymphedema, Osteoporosis;    Examination-Activity Limitations Continence;Toileting;Stand;Sleep;Caring for Others;Locomotion Level    Examination-Participation Restrictions Community Activity    Stability/Clinical Decision Making Evolving/Moderate complexity    Rehab Potential Excellent    PT Frequency 1x / week    PT Duration 12 weeks    PT Treatment/Interventions ADLs/Self Care Home Management;Biofeedback;Therapeutic activities;Therapeutic exercise;Neuromuscular re-education;Patient/family education;Manual techniques    PT Next Visit Plan work on obliques especially to reduce infernal rib angle, work on hip  adduction to work on the pelvic floor, work on back and rib mobiltiy; ask about stool leakage and strain with bowel movement    PT Home Exercise Plan Access Code: [email or web address]    Consulted and Agree with Plan of Care Patient             Patient will benefit from skilled therapeutic intervention in order to improve the following deficits and impairments:  Decreased endurance, Decreased activity tolerance, Decreased strength, Increased fascial restricitons, Decreased coordination  Visit Diagnosis: Muscle weakness (generalized)  Other lack of coordination  Urinary incontinence, unspecified type  Incontinence of feces, unspecified fecal incontinence type     Problem List Patient Active Problem List   Diagnosis Date Noted   Status post total knee replacement using cement, left 09/22/2019   Status post total knee replacement using cement, right 08/12/2018   Rotator cuff rupture    Acquired absence of bilateral breasts and nipples 09/17/2012   Edema 07/27/2011   Breast cancer of lower-outer quadrant of left female breast ([place]) 01/16/2011    Earlie Counts, PT 07/20/21 1:57 PM  [place] [facility] [address], [place], [place], [address] Phone: [phone]   Fax:  [phone]  Name: Krystal [name] MRN: [id number] Date of Birth: [birth year]

## 2021-07-25 DIAGNOSIS — R7303 Prediabetes: Secondary | ICD-10-CM | POA: Diagnosis not present

## 2021-07-25 DIAGNOSIS — I10 Essential (primary) hypertension: Secondary | ICD-10-CM | POA: Diagnosis not present

## 2021-07-25 DIAGNOSIS — E785 Hyperlipidemia, unspecified: Secondary | ICD-10-CM | POA: Diagnosis not present

## 2021-07-27 ENCOUNTER — Encounter: Payer: Medicare HMO | Admitting: Physical Therapy

## 2021-07-27 ENCOUNTER — Telehealth: Payer: Self-pay | Admitting: Physical Therapy

## 2021-07-27 ENCOUNTER — Encounter: Payer: Self-pay | Admitting: Physical Therapy

## 2021-07-27 DIAGNOSIS — M6281 Muscle weakness (generalized): Secondary | ICD-10-CM | POA: Diagnosis not present

## 2021-07-27 DIAGNOSIS — N3941 Urge incontinence: Secondary | ICD-10-CM | POA: Insufficient documentation

## 2021-07-27 DIAGNOSIS — R159 Full incontinence of feces: Secondary | ICD-10-CM

## 2021-07-27 DIAGNOSIS — R278 Other lack of coordination: Secondary | ICD-10-CM | POA: Insufficient documentation

## 2021-07-27 DIAGNOSIS — R32 Unspecified urinary incontinence: Secondary | ICD-10-CM

## 2021-07-27 NOTE — Telephone Encounter (Signed)
Called patient about her missed appointment today at 10:30. Left a message.  Earlie Counts, PT @1 /11/2021@ 10:44 AM

## 2021-07-27 NOTE — Therapy (Signed)
[facility] at [facility] [address] [place], [place], [address] Phone: [phone]   Fax:  [phone]  Physical Therapy Treatment  Patient Details  Name: Krystal [name] MRN: [id number] Date of Birth: 03/26/1951 Referring Provider (PT): Gardenia Phlegm, NP   Encounter Date: 07/27/2021   PT End of Session - 07/27/21 1130     Visit Number 6    Date for PT Re-Evaluation 08/03/21    Authorization Type [employer]    Authorization Time Period 10/20-1/06/2022    Authorization - Visit Number 6    Authorization - Number of Visits 12    PT Start Time 3790    PT Stop Time 2409    PT Time Calculation (min) 43 min    Activity Tolerance Patient tolerated treatment well    Behavior During Therapy [facility] for tasks assessed/performed             Past Medical History:  Diagnosis Date   Anemia    Arthritis    osteo   Blindness of right eye at birth   Cancer [facility]) 2011   Left breast 2011 and cervical age 52   Carpal tunnel syndrome    Cataract    Depression    Edema 07/27/2011   Fibromyalgia    muscle weakness and pain   GERD (gastroesophageal reflux disease)    Hyperlipidemia    Hypertension    benign   Lymphedema    Osteoporosis    Plantar fasciitis    Pre-diabetes    Raynaud's disease    Restless leg syndrome    Rosacea    Rotator cuff rupture    Sleep apnea    Synovitis    Ulcer     Past Surgical History:  Procedure Laterality Date   ABDOMINAL HYSTERECTOMY  1986   For bleeding and pain   BLADDER SURGERY  2006   Bladder tack   BREAST RECONSTRUCTION Bilateral 09/17/2012   Procedure: BREAST RECONSTRUCTION;  Surgeon: Theodoro Kos, DO;  Location: [place];  Service: Plastics;  Laterality: Bilateral;  BILATERAL BREAST RECONSTRUCTION WITH TISSUE EXPANDERS AND ALLOMED   BREAST RECONSTRUCTION Right 06/03/2013   Procedure: RIGHT BREAST CAPSULE CONSTRACTURE;  Surgeon: Theodoro Kos, DO;  Location: [place];  Service: Plastics;  Laterality: Right;   CARPAL TUNNEL RELEASE Bilateral 2008   EYE SURGERY Right    INJECTION KNEE Left 08/12/2018   Procedure: KNEE INJECTION-LEFT;  Surgeon: Corky Mull, MD;  Location: [facility];  Service: Orthopedics;  Laterality: Left;   JOINT REPLACEMENT     LIPOSUCTION Bilateral 01/29/2013   Procedure: LIPOSUCTION;  Surgeon: Theodoro Kos, DO;  Location: [place];  Service: Plastics;  Laterality: Bilateral;   LIPOSUCTION Right 06/03/2013   Procedure: LIPOSUCTION;  Surgeon: Theodoro Kos, DO;  Location: [place];  Service: Plastics;  Laterality: Right;   MASTECTOMY  2012   rt prophalactic mast-snbx   MASTECTOMY MODIFIED RADICAL  2012   left-axillary nodes   NEUROMA SURGERY Bilateral    NOSE SURGERY  2007   PORT-A-CATH REMOVAL     insertion and   THROAT SURGERY     TONSILLECTOMY     TOTAL KNEE ARTHROPLASTY Right 08/12/2018   Procedure: TOTAL KNEE ARTHROPLASTY-RIGHT;  Surgeon: Corky Mull, MD;  Location: [facility];  Service: Orthopedics;  Laterality: Right;   TOTAL KNEE ARTHROPLASTY Left 09/22/2019   Procedure: TOTAL KNEE ARTHROPLASTY;  Surgeon: Corky Mull, MD;  Location: [facility];  Service: Orthopedics;  Laterality: Left;   UVULOPALATOPHARYNGOPLASTY      There were no vitals filed for this visit.   Subjective Assessment - 07/27/21 1053     Subjective I am not wetting myself as much. I can control the urine better as I walk to the bathroom.  Urinary leakage is 70% better. My stools are softer. She does not have to strain as hard with bowel movement. Type 4 but if eat something spicy comes out as type 6. No stool leakage.    Patient Stated Goals Work on urinary leakage    Currently in Pain? No/denies    Multiple Pain Sites No                               [facility] Adult PT Treatment/Exercise - 07/27/21 0001       Lumbar Exercises: Supine   Other Supine Lumbar Exercises sidely holdinga  red band and pull down into horizonttal adduction to engage the obliques with ball squeeze for pelvic floor contraction 15x each side    Other Supine Lumbar Exercises supine on an incline with a yoga block between knees working on abdominal contraction and pulling the rib cage downward      Manual Therapy   Manual Therapy Soft tissue mobilization    Soft tissue mobilization sidely to bilateral abdominals and diaphragm in sidely                     PT Education - 07/27/21 1130     Education Details supine curl up and sidely oblique    Person(s) Educated Patient    Methods Explanation;Demonstration;Other (comment)   took video for her   Comprehension Verbalized understanding;Returned demonstration              PT Short Term Goals - 07/04/21 0858       PT SHORT TERM GOAL #1   Title independent with initial HEP    Time 4    Period Weeks    Status Achieved    Target Date 06/08/21      PT SHORT TERM GOAL #2   Title educated on vaginal health    Time 4    Period Weeks    Status Achieved      PT SHORT TERM GOAL #3   Title ability to perform diaphragmatic breathing    Time 4    Period Weeks    Status Achieved               PT Long Term Goals - 07/20/21 1356       PT LONG TERM GOAL #1   Title independent with advanced HEP for core and pelvic floor    Time 12    Period Weeks    Status On-going    Target Date 08/03/21      PT LONG TERM GOAL #2   Title ability to not strain when having a bowel movement due ot improved coordination of the pelvic floor    Baseline straining is 50% better.    Time 12    Period Weeks    Status On-going      PT LONG TERM GOAL #3   Title pelvic floro strength is >/= 3/5 holding for 10 seconds to reduce her urinary leakage >/= 75%    Time 12    Period Weeks    Status On-going      PT  LONG TERM GOAL #4   Title Stoll leakage  when she is passing gas decreased >/= 75% due to improved coordination of the pelvic floor     Baseline leakage is 25% better.    Time 12    Period Weeks    Status On-going    Target Date 08/03/21                   Plan - 07/27/21 1131     Clinical Impression Statement Patient reports no fecal leakage. She is not straining as much to have a bowel movement and her stool is type 4 and Type 6. Her urinary leakage is 70% better. She is doing better with her abdominal contraction. She had some fascial restrctions in the obliques and midline of the abdomen. Patient is being dilegnt with her home program. Patient will benefit from skilled therapy to work on pelvic floor strength and coordination to reduce her urinary leakage.    Personal Factors and Comorbidities Age;Comorbidity 3+;Fitness;Past/Current Experience;Time since onset of injury/illness/exacerbation    Comorbidities Left breast cancer 2011; Fibromyalgia; Abdominal hysterectomy 1986; Bladder tack x2; Raynauds; Lymphedema, Osteoporosis;    Examination-Activity Limitations Continence;Toileting;Stand;Sleep;Caring for Others;Locomotion Level    Examination-Participation Restrictions Community Activity    Stability/Clinical Decision Making Evolving/Moderate complexity    Rehab Potential Excellent    PT Duration 12 weeks    PT Treatment/Interventions ADLs/Self Care Home Management;Biofeedback;Therapeutic activities;Therapeutic exercise;Neuromuscular re-education;Patient/family education;Manual techniques    PT Next Visit Plan work on obliques especially to reduce infernal rib angle, work on hip adduction to work on the pelvic floor, work on back and rib mobiltiy; write renewal and put in [place] for next visit.    PT Home Exercise Plan Access Code: [email or web address]    Consulted and Agree with Plan of Care Patient             Patient will benefit from skilled therapeutic intervention in order to improve the following deficits and impairments:  Decreased endurance, Decreased activity tolerance, Decreased strength, Increased fascial  restricitons, Decreased coordination  Visit Diagnosis: Muscle weakness (generalized)  Other lack of coordination  Urinary incontinence, unspecified type  Incontinence of feces, unspecified fecal incontinence type     Problem List Patient Active Problem List   Diagnosis Date Noted   Status post total knee replacement using cement, left 09/22/2019   Status post total knee replacement using cement, right 08/12/2018   Rotator cuff rupture    Acquired absence of bilateral breasts and nipples 09/17/2012   Edema 07/27/2011   Breast cancer of lower-outer quadrant of left female breast ([place]) 01/16/2011    Earlie Counts, PT 07/27/21 11:34 AM  [place] at [facility] [address] [place], [place], [address] Phone: [phone]   Fax:  [phone]  Name: Krystal [name] MRN: [id number] Date of Birth: 13-Oct-1950

## 2021-07-31 DIAGNOSIS — M797 Fibromyalgia: Secondary | ICD-10-CM | POA: Diagnosis not present

## 2021-07-31 DIAGNOSIS — G4700 Insomnia, unspecified: Secondary | ICD-10-CM | POA: Diagnosis not present

## 2021-07-31 DIAGNOSIS — G2581 Restless legs syndrome: Secondary | ICD-10-CM | POA: Diagnosis not present

## 2021-07-31 DIAGNOSIS — I208 Other forms of angina pectoris: Secondary | ICD-10-CM | POA: Diagnosis not present

## 2021-07-31 DIAGNOSIS — Z0000 Encounter for general adult medical examination without abnormal findings: Secondary | ICD-10-CM | POA: Diagnosis not present

## 2021-07-31 DIAGNOSIS — F4321 Adjustment disorder with depressed mood: Secondary | ICD-10-CM | POA: Diagnosis not present

## 2021-07-31 DIAGNOSIS — I10 Essential (primary) hypertension: Secondary | ICD-10-CM | POA: Diagnosis not present

## 2021-07-31 DIAGNOSIS — E785 Hyperlipidemia, unspecified: Secondary | ICD-10-CM | POA: Diagnosis not present

## 2021-08-03 ENCOUNTER — Encounter: Payer: Medicare HMO | Attending: Family Medicine | Admitting: Physical Therapy

## 2021-08-03 ENCOUNTER — Encounter: Payer: Self-pay | Admitting: Physical Therapy

## 2021-08-03 ENCOUNTER — Other Ambulatory Visit: Payer: Self-pay

## 2021-08-03 DIAGNOSIS — M6281 Muscle weakness (generalized): Secondary | ICD-10-CM

## 2021-08-03 DIAGNOSIS — R278 Other lack of coordination: Secondary | ICD-10-CM | POA: Diagnosis not present

## 2021-08-03 DIAGNOSIS — N3941 Urge incontinence: Secondary | ICD-10-CM | POA: Diagnosis not present

## 2021-08-03 DIAGNOSIS — R159 Full incontinence of feces: Secondary | ICD-10-CM

## 2021-08-03 DIAGNOSIS — R32 Unspecified urinary incontinence: Secondary | ICD-10-CM

## 2021-08-03 NOTE — Patient Instructions (Signed)
Access Code: [email or web address] URL: [email or web address] Date: 08/03/2021 Prepared by: Earlie Counts  Exercises Seated Pelvic Floor Contraction - 3 x daily - 7 x weekly - 1 sets - 10 reps - 5 sec hold Supine Transversus Abdominis Bracing - Hands on Stomach - 1 x daily - 7 x weekly - 1 sets - 10 reps Hooklying Isometric Hip Flexion - 1 x daily - 3 x weekly - 2 sets - 5 reps - 5 seconds hold Sidelying Diaphragmatic Breathing - 1 x daily - 3 x weekly - 2 sets - 10 reps Standing Anti-Rotation Press with Anchored Resistance - 1 x daily - 3 x weekly - 2 sets - 10 reps Deadlift with Resistance - 1 x daily - 3 x weekly - 2 sets - 10 reps Standing Marching - 1 x daily - 3 x weekly - 2 sets - 10 reps Sit to Stand with Pelvic Floor Contraction - 1 x daily - 7 x weekly - 1 sets - 10 reps  Earlie Counts, PT [facility] [address] [place], [place] [address] W: [phone] [email or web address]

## 2021-08-03 NOTE — Therapy (Signed)
[facility] at [facility] [address] [place], [place], [address] Phone: [phone]   Fax:  [phone]  Physical Therapy Treatment  Patient Details  Name: Krystal [name] MRN: [id number] Date of Birth: [birth year] Referring Provider (PT): Gardenia Phlegm, NP   Encounter Date: 08/03/2021   PT End of Session - 08/03/21 1124     Visit Number 7    Date for PT Re-Evaluation 08/03/21    Authorization Type [employer]    Authorization Time Period 10/20-1/06/2022    Authorization - Visit Number 7    Authorization - Number of Visits 12    PT Start Time 1030    PT Stop Time 1120    PT Time Calculation (min) 50 min    Activity Tolerance Patient tolerated treatment well    Behavior During Therapy [facility] for tasks assessed/performed             Past Medical History:  Diagnosis Date   Anemia    Arthritis    osteo   Blindness of right eye at birth   Cancer [facility]) 2011   Left breast 2011 and cervical age 60   Carpal tunnel syndrome    Cataract    Depression    Edema 07/27/2011   Fibromyalgia    muscle weakness and pain   GERD (gastroesophageal reflux disease)    Hyperlipidemia    Hypertension    benign   Lymphedema    Osteoporosis    Plantar fasciitis    Pre-diabetes    Raynaud's disease    Restless leg syndrome    Rosacea    Rotator cuff rupture    Sleep apnea    Synovitis    Ulcer     Past Surgical History:  Procedure Laterality Date   ABDOMINAL HYSTERECTOMY  1986   For bleeding and pain   BLADDER SURGERY  2006   Bladder tack   BREAST RECONSTRUCTION Bilateral 09/17/2012   Procedure: BREAST RECONSTRUCTION;  Surgeon: Theodoro Kos, DO;  Location: [place];  Service: Plastics;  Laterality: Bilateral;  BILATERAL BREAST RECONSTRUCTION WITH TISSUE EXPANDERS AND ALLOMED   BREAST RECONSTRUCTION Right 06/03/2013   Procedure: RIGHT BREAST CAPSULE CONSTRACTURE;  Surgeon: Theodoro Kos, DO;  Location: [place];  Service: Plastics;  Laterality: Right;   CARPAL TUNNEL RELEASE Bilateral 2008   EYE SURGERY Right    INJECTION KNEE Left 08/12/2018   Procedure: KNEE INJECTION-LEFT;  Surgeon: Corky Mull, MD;  Location: [facility];  Service: Orthopedics;  Laterality: Left;   JOINT REPLACEMENT     LIPOSUCTION Bilateral 01/29/2013   Procedure: LIPOSUCTION;  Surgeon: Theodoro Kos, DO;  Location: [place];  Service: Plastics;  Laterality: Bilateral;   LIPOSUCTION Right 06/03/2013   Procedure: LIPOSUCTION;  Surgeon: Theodoro Kos, DO;  Location: [place];  Service: Plastics;  Laterality: Right;   MASTECTOMY  2012   rt prophalactic mast-snbx   MASTECTOMY MODIFIED RADICAL  2012   left-axillary nodes   NEUROMA SURGERY Bilateral    NOSE SURGERY  2007   PORT-A-CATH REMOVAL     insertion and   THROAT SURGERY     TONSILLECTOMY     TOTAL KNEE ARTHROPLASTY Right 08/12/2018   Procedure: TOTAL KNEE ARTHROPLASTY-RIGHT;  Surgeon: Corky Mull, MD;  Location: [facility];  Service: Orthopedics;  Laterality: Right;   TOTAL KNEE ARTHROPLASTY Left 09/22/2019   Procedure: TOTAL KNEE ARTHROPLASTY;  Surgeon: Corky Mull, MD;  Location: [facility];  Service: Orthopedics;  Laterality: Left;   UVULOPALATOPHARYNGOPLASTY      There were no vitals filed for this visit.   Subjective Assessment - 08/03/21 1032     Subjective I feel stronger. The stools are doing better. They are not squirty. Stool between type 5 and type 4. Stains a little to finish the stool. Leak when getting in and out of vehicle. No leakage with walking around. Leak when drinks alot of coffee. Leak when walking to the commode. Leak between the recliner and 3 steps.    Patient Stated Goals Work on urinary leakage    Currently in Pain? 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, occupational at [employer]    Leisure 2 times per week- chair exercise, walk dog 10 blocks 2 times per day      Cognition   Overall Cognitive Status Within Functional Limits for tasks assessed      Observation/Other Assessments   Focus on Therapeutic Outcomes (FOTO)  UIQ-7 43; CRAIQ-7 0; PFIQ-7 43                        Pelvic Floor Special Questions - 08/03/21 0001     Number of Vaginal Deliveries 2    Any difficulty with labor and deliveries Yes   grade 3 tear or 4   Urinary Leakage Yes    Pad use all day trip thick pad but other times uses a thin pad    Activities that cause leaking Bending;Other    Other activities that cause leaking get up from a deep chair or recliner, line dancing,    Urinary frequency after she pumps out the fluid from the lympheddema    Fecal incontinence No    Pelvic Floor Internal Exam Patient confirmis identification adn approves PT to assess pelvic floor and treatment    Exam Type Vaginal    Palpation manual work to the sides of the intoritus    Strength good squeeze, good lift, able to hold agaisnt strong resistance    Strength # of seconds 10               [facility] Adult PT Treatment/Exercise - 08/03/21 0001       Neuro Re-ed    Neuro Re-ed Details  supine pelvic lfoor ocntraciton 5 times holding for 10 seconds      Lumbar Exercises: Standing   Other Standing Lumbar Exercises dead lift with green theraband 10x with tactile cues to relax shoulders andkeep spine extended; standing  antirotational exercise with green band and pelvic floor contraction    Other Standing Lumbar Exercises standing Pallof with green band 10x each; standing hip flexion with pelvic floor contraction  and verbal cues to go slowly      Manual Therapy   Internal Pelvic Floor manual work to the sides of the introituus to improve tissue mobility and pelvic floor contraction improved afterwards                     PT Education - 08/03/21 1124     Education Details Access Code: [email or web address]    Person(s) Educated Patient    Methods Explanation;Demonstration;Verbal cues;Handout    Comprehension Returned demonstration;Verbalized understanding              PT Short Term Goals - 08/03/21 1053       PT SHORT TERM GOAL #1   Title independent with initial HEP    Time 4    Period Weeks    Status Achieved      PT SHORT TERM GOAL #2   Title educated on vaginal health    Time 4    Period Weeks    Status Achieved    Target Date 06/08/21      PT SHORT TERM GOAL #3   Title ability to perform diaphragmatic breathing    Time 4    Period Weeks    Status Achieved               PT Long Term Goals - 08/03/21 1054       PT LONG TERM GOAL #1   Title independent with advanced HEP for core and pelvic floor    Time 12    Period Weeks    Status Achieved      PT LONG TERM GOAL #2   Title ability to not strain when having a bowel movement due ot improved coordination of the pelvic floor    Baseline sometimes for the end of the stool    Time 12    Period Weeks    Status Achieved      PT LONG TERM GOAL #3   Title pelvic floro strength is >/= 3/5 holding for 10 seconds to reduce her urinary leakage >/= 75%    Time 12    Period Weeks    Status Achieved      PT LONG TERM GOAL #4   Title Stoll leakage  when she is passing gas decreased >/= 75% due to improved coordination of the pelvic floor    Time 12    Period Weeks    Status Achieved                   Plan -  08/03/21 1125     Clinical Impression Statement Patient report her urinary leakage is 75% better. She will leak with line dancing, getting out of the recliner or deep chair, and bending forward. She has increased swelling in the abdomen so when she bends will place pressure on the bladder due to this. Pelvic floor strength is 4/5 holding for 10 seconds. She is not having stool leakage. Her stool type is between Type 4 and 5. She reports that at times she may strain at the end of the bowel movements. Patient is using her mositurizers for her dryness and is helping. Patient is independent with her HEP and ready for discharge.    Personal Factors and Comorbidities Age;Comorbidity 3+;Fitness;Past/Current Experience;Time since onset of injury/illness/exacerbation    Comorbidities Left breast cancer 2011; Fibromyalgia; Abdominal hysterectomy 1986; Bladder tack x2; Raynauds; Lymphedema, Osteoporosis;    Examination-Activity Limitations Continence;Toileting;Stand;Sleep;Caring for Others;Locomotion Level    Examination-Participation  Restrictions Community Activity    Stability/Clinical Decision Making Evolving/Moderate complexity    Rehab Potential Excellent    PT Treatment/Interventions ADLs/Self Care Home Management;Biofeedback;Therapeutic activities;Therapeutic exercise;Neuromuscular re-education;Patient/family education;Manual techniques    PT Next Visit Plan D[place] to HEP    PT Home Exercise Plan Access Code: [email or web address]    Consulted and Agree with Plan of Care Patient             Patient will benefit from skilled therapeutic intervention in order to improve the following deficits and impairments:  Decreased endurance, Decreased activity tolerance, Decreased strength, Increased fascial restricitons, Decreased coordination  Visit Diagnosis: Muscle weakness (generalized)  Other lack of coordination  Urinary incontinence, unspecified type  Incontinence of feces, unspecified fecal incontinence  type     Problem List Patient Active Problem List   Diagnosis Date Noted   Status post total knee replacement using cement, left 09/22/2019   Status post total knee replacement using cement, right 08/12/2018   Rotator cuff rupture    Acquired absence of bilateral breasts and nipples 09/17/2012   Edema 07/27/2011   Breast cancer of lower-outer quadrant of left female breast ([place]) 01/16/2011    Earlie Counts, PT 08/03/21 11:29 AM  [place] at [facility] [address], [place], [place], [address] Phone: [phone]   Fax:  [phone]  Name: Krystal [name] MRN: [id number] Date of Birth: 1951/04/12  PHYSICAL THERAPY DISCHARGE SUMMARY  Visits from Start of Care: 7  Current functional level related to goals / functional outcomes: See above.    Remaining deficits: See above.    Education / Equipment: HEP   Patient agrees to discharge. Patient goals were met. Patient is being discharged due to meeting the stated rehab goals. Thank you for the referral. Earlie Counts, PT 08/03/21 11:30 AM

## 2021-08-17 DIAGNOSIS — M1811 Unilateral primary osteoarthritis of first carpometacarpal joint, right hand: Secondary | ICD-10-CM | POA: Diagnosis not present

## 2021-08-17 DIAGNOSIS — M65331 Trigger finger, right middle finger: Secondary | ICD-10-CM | POA: Diagnosis not present

## 2021-10-10 DIAGNOSIS — D2262 Melanocytic nevi of left upper limb, including shoulder: Secondary | ICD-10-CM | POA: Diagnosis not present

## 2021-10-10 DIAGNOSIS — L718 Other rosacea: Secondary | ICD-10-CM | POA: Diagnosis not present

## 2021-10-10 DIAGNOSIS — D485 Neoplasm of uncertain behavior of skin: Secondary | ICD-10-CM | POA: Diagnosis not present

## 2021-10-10 DIAGNOSIS — D2261 Melanocytic nevi of right upper limb, including shoulder: Secondary | ICD-10-CM | POA: Diagnosis not present

## 2021-10-10 DIAGNOSIS — D0439 Carcinoma in situ of skin of other parts of face: Secondary | ICD-10-CM | POA: Diagnosis not present

## 2021-10-10 DIAGNOSIS — D2271 Melanocytic nevi of right lower limb, including hip: Secondary | ICD-10-CM | POA: Diagnosis not present

## 2021-10-10 DIAGNOSIS — D2272 Melanocytic nevi of left lower limb, including hip: Secondary | ICD-10-CM | POA: Diagnosis not present

## 2021-10-10 DIAGNOSIS — D225 Melanocytic nevi of trunk: Secondary | ICD-10-CM | POA: Diagnosis not present

## 2021-10-24 DIAGNOSIS — G4733 Obstructive sleep apnea (adult) (pediatric): Secondary | ICD-10-CM | POA: Diagnosis not present

## 2021-10-30 ENCOUNTER — Other Ambulatory Visit: Payer: Self-pay | Admitting: Adult Health

## 2021-11-03 DIAGNOSIS — D0439 Carcinoma in situ of skin of other parts of face: Secondary | ICD-10-CM | POA: Diagnosis not present

## 2021-11-03 DIAGNOSIS — Z481 Encounter for planned postprocedural wound closure: Secondary | ICD-10-CM | POA: Diagnosis not present

## 2021-11-03 DIAGNOSIS — D049 Carcinoma in situ of skin, unspecified: Secondary | ICD-10-CM | POA: Diagnosis not present

## 2021-11-21 DIAGNOSIS — E119 Type 2 diabetes mellitus without complications: Secondary | ICD-10-CM | POA: Diagnosis not present

## 2021-11-21 IMAGING — DX DG KNEE 1-2V PORT*L*
2 series · 2 of 2 positions shown · non-contrast
Comparison: None.

CLINICAL DATA: Status post total left knee replacement.

EXAM:
PORTABLE LEFT KNEE - 1-2 VIEW

[knee ap]
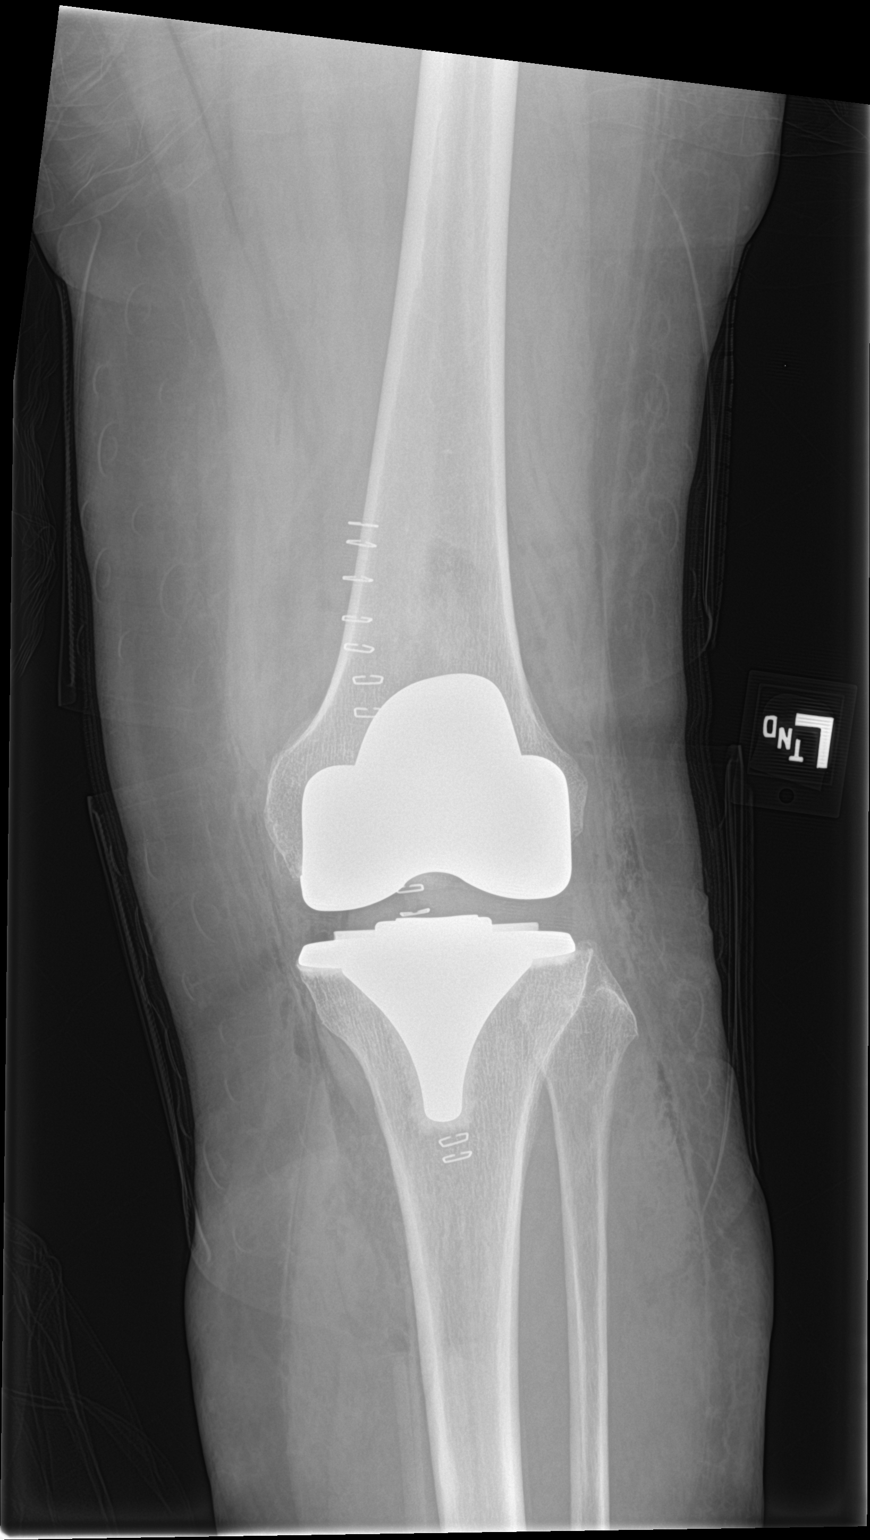

[knee lat]
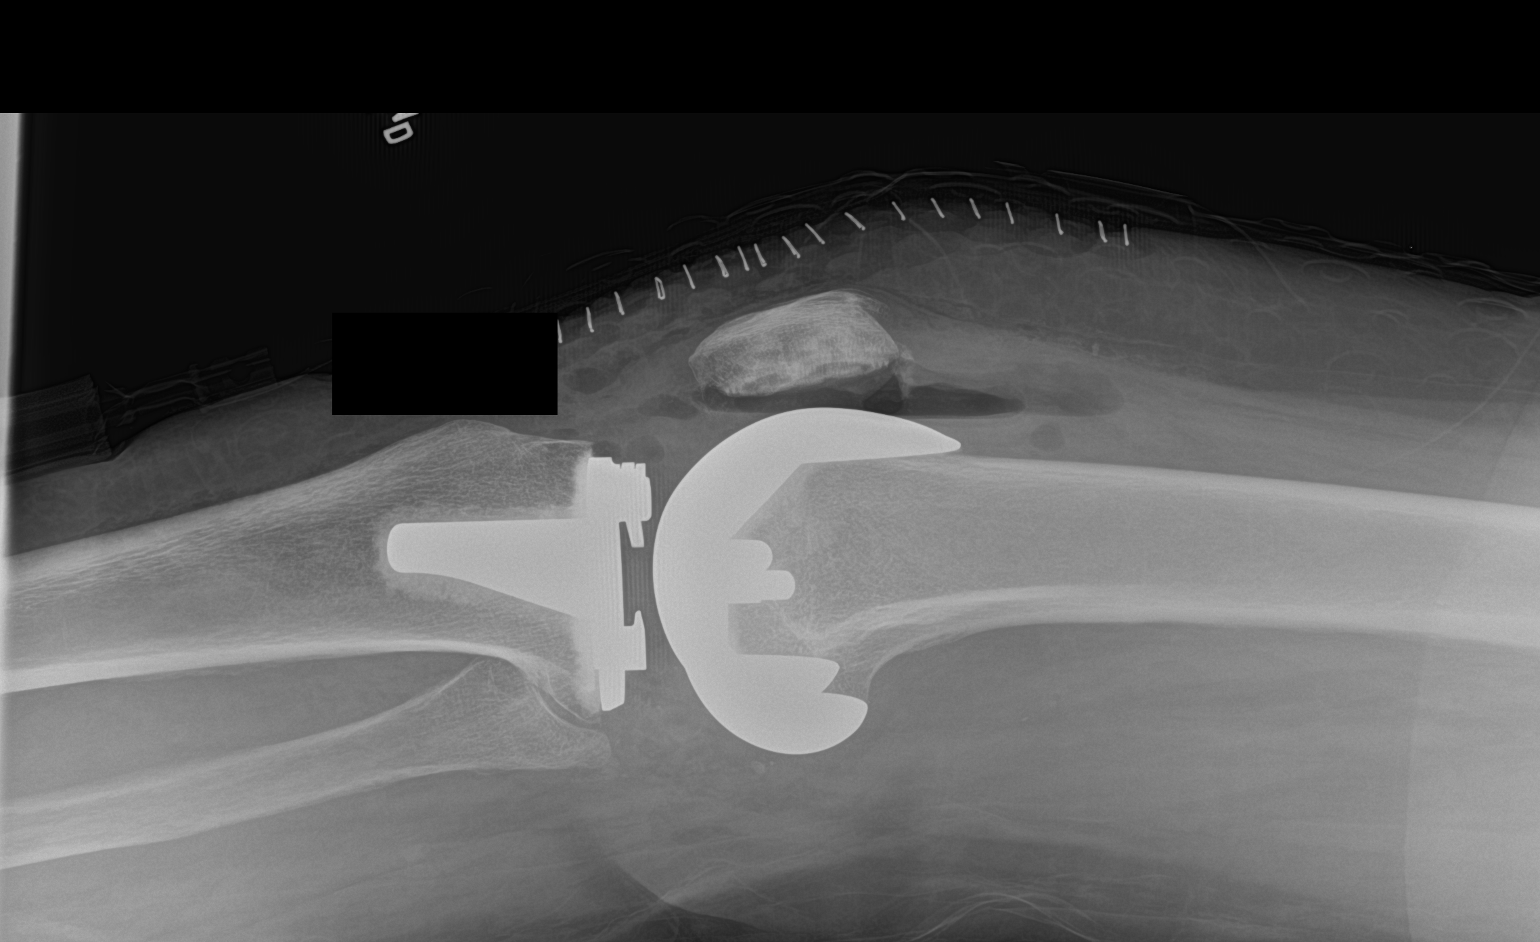

[2 of 2 positions shown; findings below may reference images not displayed]

FINDINGS: The left knee demonstrates a total knee arthroplasty without
evidence of hardware failure complication. There is no significant
joint effusion. There is no fracture or dislocation. The alignment
is anatomic. Post-surgical changes noted in the surrounding soft
tissues.
IMPRESSION: Interval left total knee arthroplasty.

## 2021-11-28 DIAGNOSIS — E785 Hyperlipidemia, unspecified: Secondary | ICD-10-CM | POA: Diagnosis not present

## 2021-11-28 DIAGNOSIS — R32 Unspecified urinary incontinence: Secondary | ICD-10-CM | POA: Diagnosis not present

## 2021-11-28 DIAGNOSIS — E119 Type 2 diabetes mellitus without complications: Secondary | ICD-10-CM | POA: Diagnosis not present

## 2021-11-28 DIAGNOSIS — R350 Frequency of micturition: Secondary | ICD-10-CM | POA: Diagnosis not present

## 2021-11-28 DIAGNOSIS — I10 Essential (primary) hypertension: Secondary | ICD-10-CM | POA: Diagnosis not present

## 2021-12-01 DIAGNOSIS — R32 Unspecified urinary incontinence: Secondary | ICD-10-CM | POA: Diagnosis not present

## 2021-12-06 ENCOUNTER — Encounter: Payer: Self-pay | Admitting: *Deleted

## 2021-12-11 ENCOUNTER — Ambulatory Visit (INDEPENDENT_AMBULATORY_CARE_PROVIDER_SITE_OTHER): Payer: Medicare HMO | Admitting: Urology

## 2021-12-11 ENCOUNTER — Encounter: Payer: Self-pay | Admitting: Urology

## 2021-12-11 VITALS — BP 128/78 | HR 83 | Ht 63.0 in | Wt 208.0 lb

## 2021-12-11 DIAGNOSIS — R32 Unspecified urinary incontinence: Secondary | ICD-10-CM

## 2021-12-11 DIAGNOSIS — N3946 Mixed incontinence: Secondary | ICD-10-CM

## 2021-12-11 LAB — URINALYSIS, COMPLETE
Bilirubin, UA: NEGATIVE
Ketones, UA: NEGATIVE
Leukocytes,UA: NEGATIVE
Nitrite, UA: NEGATIVE
Protein,UA: NEGATIVE
RBC, UA: NEGATIVE
Specific Gravity, UA: 1.015 (ref 1.005–1.030)
Urobilinogen, Ur: 2 mg/dL — ABNORMAL HIGH (ref 0.2–1.0)
pH, UA: 7 (ref 5.0–7.5)

## 2021-12-11 LAB — MICROSCOPIC EXAMINATION: Epithelial Cells (non renal): >10 /hpf — AB (ref 0–10)

## 2021-12-11 NOTE — Progress Notes (Signed)
12/11/2021 9:46 AM   Krystal [name] November 09, 1950 [id number]  Referring provider: [name], MD [address] [facility] [place],  [place] [address]  Chief Complaint  Patient presents with   Urinary Incontinence    HPI: I was consulted to assist the patient's urinary incontinence.  She has urge incontinence.  She leaks with coughing sneezing bending.  Both are significant.  She leaks when she gets in and out of her [name] and when she goes from a sitting to standing position.  She can soak up to 5 pads a day.  No bedwetting  She voids every hour but might be able to hold it for 2 hours.  She knows her every bathroom is.  Gets up 4 times a night.  She is on a diuretic.  She has ankle edema  She had a bladder suspension 30 years ago.  She had a hysterectomy.  She is prone to constipation  No history of kidney stones bladder infections or neurologic issues.  No previous treatment   PMH: Past Medical History:  Diagnosis Date   Anemia    Arthritis    osteo   Blindness of right eye at birth   Cancer [facility]) 2011   Left breast 2011 and cervical age 45   Carpal tunnel syndrome    Cataract    Depression    Edema 07/27/2011   Fibromyalgia    muscle weakness and pain   GERD (gastroesophageal reflux disease)    Hyperlipidemia    Hypertension    benign   Lymphedema    Osteoporosis    Plantar fasciitis    Pre-diabetes    Raynaud's disease    Restless leg syndrome    Rosacea    Rotator cuff rupture    Sleep apnea    Synovitis    Ulcer     Surgical History: Past Surgical History:  Procedure Laterality Date   ABDOMINAL HYSTERECTOMY  1986   For bleeding and pain   BLADDER SURGERY  2006   Bladder tack   BREAST RECONSTRUCTION Bilateral 09/17/2012   Procedure: BREAST RECONSTRUCTION;  Surgeon: Theodoro Kos, DO;  Location: [place];  Service: Plastics;  Laterality: Bilateral;  BILATERAL BREAST RECONSTRUCTION WITH TISSUE EXPANDERS AND ALLOMED   BREAST  RECONSTRUCTION Right 06/03/2013   Procedure: RIGHT BREAST CAPSULE CONSTRACTURE;  Surgeon: Theodoro Kos, DO;  Location: [place];  Service: Plastics;  Laterality: Right;   CARPAL TUNNEL RELEASE Bilateral 2008   EYE SURGERY Right    INJECTION KNEE Left 08/12/2018   Procedure: KNEE INJECTION-LEFT;  Surgeon: Corky Mull, MD;  Location: [facility];  Service: Orthopedics;  Laterality: Left;   JOINT REPLACEMENT     LIPOSUCTION Bilateral 01/29/2013   Procedure: LIPOSUCTION;  Surgeon: Theodoro Kos, DO;  Location: [place];  Service: Plastics;  Laterality: Bilateral;   LIPOSUCTION Right 06/03/2013   Procedure: LIPOSUCTION;  Surgeon: Theodoro Kos, DO;  Location: [place];  Service: Plastics;  Laterality: Right;   MASTECTOMY  2012   rt prophalactic mast-snbx   MASTECTOMY MODIFIED RADICAL  2012   left-axillary nodes   NEUROMA SURGERY Bilateral    NOSE SURGERY  2007   PORT-A-CATH REMOVAL     insertion and   THROAT SURGERY     TONSILLECTOMY     TOTAL KNEE ARTHROPLASTY Right 08/12/2018   Procedure: TOTAL KNEE ARTHROPLASTY-RIGHT;  Surgeon: Corky Mull, MD;  Location: [facility];  Service: Orthopedics;  Laterality: Right;  TOTAL KNEE ARTHROPLASTY Left 09/22/2019   Procedure: TOTAL KNEE ARTHROPLASTY;  Surgeon: Corky Mull, MD;  Location: [facility];  Service: Orthopedics;  Laterality: Left;   UVULOPALATOPHARYNGOPLASTY      Home Medications:  Allergies as of 12/11/2021       Reactions   Cephalexin Nausea Only   Diaphoresis / Sweating (intolerance)   Levaquin [levofloxacin] Other (See Comments)   Stiff joints, unable to walk.        Medication List        Accurate as of Dec 11, 2021  9:46 AM. If you have any questions, ask your nurse or doctor.          alendronate 70 MG tablet Commonly known as: FOSAMAX Take 70 mg by mouth every Saturday. Take with a full glass of water on an empty stomach.   amLODipine 10 MG tablet Commonly known  as: NORVASC Take 10 mg by mouth daily.   atenolol 100 MG tablet Commonly known as: TENORMIN Take 100 mg by mouth every other day.   carisoprodol 350 MG tablet Commonly known as: SOMA Take 350 mg by mouth in the morning and at bedtime.   cholecalciferol 25 MCG (1000 UNIT) tablet Commonly known as: VITAMIN D3 Take 1,000 Units by mouth every evening.   COSAMIN DS PO Take 1 tablet by mouth 3 (three) times a week.   fluticasone 50 MCG/ACT nasal spray Commonly known as: FLONASE Place 1 spray into both nostrils daily.   furosemide 20 MG tablet Commonly known as: LASIX Take 20 mg by mouth 2 (two) times daily.   lisinopril-hydrochlorothiazide 20-25 MG tablet Commonly known as: ZESTORETIC Take 1 tablet by mouth every other day.   LYSINE ACETATE PO Take 4,000 mg by mouth daily as needed (cold sores).   magnesium oxide 400 MG tablet Commonly known as: MAG-OX Take 400 mg by mouth daily as needed (leg cramps).   methocarbamol 750 MG tablet Commonly known as: ROBAXIN Take 750 mg by mouth as needed.   omeprazole 20 MG capsule Commonly known as: PRILOSEC Take 20 mg by mouth daily.   oxyCODONE 5 MG immediate release tablet Commonly known as: Oxy IR/ROXICODONE Take 1-2 tablets (5-10 mg total) by mouth every 4 (four) hours as needed for moderate pain.   potassium chloride 10 MEQ tablet Commonly known as: KLOR-CON M Take 10 mEq by mouth in the morning and at bedtime.   predniSONE 10 MG tablet Commonly known as: DELTASONE Take 10 mg by mouth daily.   pregabalin 300 MG capsule Commonly known as: LYRICA Take 300 mg by mouth 2 (two) times daily.   rOPINIRole 1 MG tablet Commonly known as: REQUIP Take 1 mg by mouth at bedtime.   traMADol 50 MG tablet Commonly known as: ULTRAM Take 1-2 tablets (50-100 mg total) by mouth every 6 (six) hours as needed for moderate pain.   triamcinolone 0.025 % cream Commonly known as: KENALOG APPLY 1 APPLICATION TOPICALLY DAILY AS NEEDED  (RASH).   zolpidem 10 MG tablet Commonly known as: AMBIEN Take 10 mg by mouth at bedtime.        Allergies:  Allergies  Allergen Reactions   Cephalexin Nausea Only    Diaphoresis / Sweating (intolerance)   Levaquin [Levofloxacin] Other (See Comments)    Stiff joints, unable to walk.    Family History: Family History  Problem Relation Age of Onset   Cancer Mother 45       non hodgkins lymphoma   Cancer Father  neck, throat, lung   Lung cancer Father    Throat cancer Father    Hypertension Sister    Cancer Sister 76       Breast cancer dx'd 04/2011   Hypertension Brother    Breast cancer Maternal Aunt    Liver cancer Maternal Uncle    Cancer Maternal Grandmother        died 30, unknown cancer   Cancer Maternal Grandfather        died in his 7s; unknown cancer   Lung cancer Maternal Uncle    Throat cancer Maternal Uncle    Breast cancer Maternal Aunt    Cancer Cousin        died under the age 25, unknown cancer   Cancer Cousin        died age 42; unknown cancer    Social History:  reports that she has never smoked. She has never used smokeless tobacco. She reports current alcohol use. She reports that she does not use drugs.  ROS:                                        Physical Exam: BP 128/78   Pulse 83   Ht '5\' 3"'$  (1.6 m)   Wt 94.3 kg   BMI 36.85 kg/m   Constitutional:  Alert and oriented, No acute distress. HEENT: [place] AT, moist mucus membranes.  Trachea midline, no masses. Cardiovascular: No clubbing, cyanosis, or edema. Respiratory: Normal respiratory effort, no increased work of breathing. GI: Abdomen is soft, nontender, nondistended, no abdominal masses GU: Well supported bladder neck and no stress incontinence Skin: No rashes, bruises or suspicious lesions. Lymph: No cervical or inguinal adenopathy. Neurologic: Grossly intact, no focal deficits, moving all 4 extremities. Psychiatric: Normal mood and  affect.  Laboratory Data: Lab Results  Component Value Date   WBC 12.3 (H) 09/24/2019   HGB 12.0 09/24/2019   HCT 35.3 (L) 09/24/2019   MCV 101.1 (H) 09/24/2019   PLT 171 09/24/2019    Lab Results  Component Value Date   CREATININE 0.90 04/15/2020    No results found for: PSA  No results found for: TESTOSTERONE  Lab Results  Component Value Date   HGBA1C 5.7 (H) 04/03/2019    Urinalysis    Component Value Date/Time   COLORURINE YELLOW (A) 09/24/2019 1027   APPEARANCEUR CLEAR (A) 09/24/2019 1027   LABSPEC 1.010 09/24/2019 1027   LABSPEC 1.005 04/06/2016 0937   PHURINE 6.0 09/24/2019 1027   GLUCOSEU NEGATIVE 09/24/2019 1027   GLUCOSEU Negative 04/06/2016 0937   HGBUR NEGATIVE 09/24/2019 1027   BILIRUBINUR NEGATIVE 09/24/2019 1027   BILIRUBINUR Negative 04/06/2016 0937   KETONESUR NEGATIVE 09/24/2019 1027   PROTEINUR NEGATIVE 09/24/2019 1027   UROBILINOGEN 0.2 04/06/2016 0937   NITRITE POSITIVE (A) 09/24/2019 1027   LEUKOCYTESUR SMALL (A) 09/24/2019 1027   LEUKOCYTESUR Negative 04/06/2016 0937    Pertinent Imaging: Urine reviewed.  Urine sent for culture.  Chart reviewed  Assessment & Plan: Patient has mixed incontinence.  Call if culture is positive.  She has hourly frequency and significant nocturia and likely has nocturnal diuresis she will return for urodynamics and cystoscopy.  Based upon the pelvic examination and severity of incontinence I would not be surprised if her primary problem is overactive bladder  1. Urinary incontinence, unspecified type  - Urinalysis, Complete   No follow-ups on file.  Jewett Mcgann  Reola Mosher, MD  [facility] [address] [place], [place] [address] [phone]

## 2021-12-11 NOTE — Patient Instructions (Signed)

## 2021-12-12 DIAGNOSIS — L570 Actinic keratosis: Secondary | ICD-10-CM | POA: Diagnosis not present

## 2021-12-12 DIAGNOSIS — D0439 Carcinoma in situ of skin of other parts of face: Secondary | ICD-10-CM | POA: Diagnosis not present

## 2021-12-13 DIAGNOSIS — M79644 Pain in right finger(s): Secondary | ICD-10-CM | POA: Diagnosis not present

## 2021-12-13 DIAGNOSIS — M1811 Unilateral primary osteoarthritis of first carpometacarpal joint, right hand: Secondary | ICD-10-CM | POA: Diagnosis not present

## 2021-12-14 LAB — CULTURE, URINE COMPREHENSIVE

## 2022-01-08 ENCOUNTER — Other Ambulatory Visit: Payer: Medicare HMO | Admitting: Urology

## 2022-01-09 DIAGNOSIS — R69 Illness, unspecified: Secondary | ICD-10-CM | POA: Diagnosis not present

## 2022-01-09 DIAGNOSIS — N3941 Urge incontinence: Secondary | ICD-10-CM | POA: Diagnosis not present

## 2022-01-12 ENCOUNTER — Other Ambulatory Visit: Payer: Self-pay | Admitting: Urology

## 2022-01-16 DIAGNOSIS — Z0100 Encounter for examination of eyes and vision without abnormal findings: Secondary | ICD-10-CM | POA: Diagnosis not present

## 2022-01-16 DIAGNOSIS — H5316 Psychophysical visual disturbances: Secondary | ICD-10-CM | POA: Diagnosis not present

## 2022-01-16 DIAGNOSIS — H43813 Vitreous degeneration, bilateral: Secondary | ICD-10-CM | POA: Diagnosis not present

## 2022-02-05 ENCOUNTER — Ambulatory Visit: Payer: Medicare HMO | Admitting: Urology

## 2022-02-05 ENCOUNTER — Encounter: Payer: Self-pay | Admitting: Urology

## 2022-02-05 VITALS — BP 129/77 | HR 87 | Ht 63.0 in | Wt 208.0 lb

## 2022-02-05 DIAGNOSIS — N3946 Mixed incontinence: Secondary | ICD-10-CM | POA: Diagnosis not present

## 2022-02-05 DIAGNOSIS — R32 Unspecified urinary incontinence: Secondary | ICD-10-CM

## 2022-02-05 MED ORDER — MIRABEGRON ER 50 MG PO TB24: 50.0000 mg | ORAL_TABLET | Freq: Every day | ORAL | 11 refills | Status: DC

## 2022-02-05 NOTE — Addendum Note (Signed)
Addended by: Despina Hidden on: 02/05/2022 12:19 PM   Modules accepted: Orders

## 2022-02-05 NOTE — Progress Notes (Signed)
02/05/2022 11:53 AM   Krystal [name] [birth year] [id number]  Referring provider: [name], MD [address] [facility] [place],  [place] [address]  Chief Complaint  Patient presents with   Cysto    HPI: I was consulted to assist the patient's urinary incontinence.  She has urge incontinence.  She leaks with coughing sneezing bending.  Both are significant.  She leaks when she gets in and out of her Krystal [name] and when she goes from a sitting to standing position.  She can soak up to 5 pads a day.  No bedwetting  She voids every hour but might be able to hold it for 2 hours.  She knows her every bathroom is.  Gets up 4 times a night.  She is on a diuretic.  She has ankle edema  She had a bladder suspension 30 years ago.  She had a hysterectomy.  She is prone to constipation    Well supported bladder neck and no stress incontinence  patient has mixed incontinence.  Call if culture is positive.  She has hourly frequency and significant nocturia and likely has nocturnal diuresis she will return for urodynamics and cystoscopy.  Based upon the pelvic examination and severity of incontinence I would not be surprised if her primary problem is overactive bladder  Today Frequency stable.  Incontinence stable.  Last culture she is Streptococcus in the urine On urodynamics she did not void and was catheterized for 25 mL.  Maximum bladder capacity was 320 mL.  She had increased bladder sensation.  Bladder was unstable reaching pressure of 11 cm of water.  She had urgency but no incontinence.  No stress incontinence with Valsalva pressure of 139 cm of water.  During voiding she voided 245 mL with maximal flow of 7 mils per second.  Maximum voiding pressure 12 cm of water.  Residual 62 mL.  EMG activity mildly increased.  Some of the contractions were provoked with coughing and Valsalva.  She also leaks with positional changes.  She had a long prolonged voiding pattern.  The details of the  urodynamics are signed dictated  Cystoscopy: Patient underwent flexible cystoscopy utilizing sterile technique.  Bladder mucosa and trigone were normal.  No cysts foreign body or carcinoma.  Urine was a little bit cloudy so I sent it for culture again   PMH: Past Medical History:  Diagnosis Date   Anemia    Arthritis    osteo   Blindness of right eye at birth   Cancer [facility]) 2011   Left breast 2011 and cervical age 71   Carpal tunnel syndrome    Cataract    Depression    Edema 07/27/2011   Fibromyalgia    muscle weakness and pain   GERD (gastroesophageal reflux disease)    Hyperlipidemia    Hypertension    benign   Lymphedema    Osteoporosis    Plantar fasciitis    Pre-diabetes    Raynaud's disease    Restless leg syndrome    Rosacea    Rotator cuff rupture    Sleep apnea    Synovitis    Ulcer     Surgical History: Past Surgical History:  Procedure Laterality Date   ABDOMINAL HYSTERECTOMY  1986   For bleeding and pain   BLADDER SURGERY  2006   Bladder tack   BREAST RECONSTRUCTION Bilateral 09/17/2012   Procedure: BREAST RECONSTRUCTION;  Surgeon: Theodoro Kos, DO;  Location: [place];  Service: Plastics;  Laterality: Bilateral;  BILATERAL BREAST RECONSTRUCTION WITH TISSUE EXPANDERS AND ALLOMED   BREAST RECONSTRUCTION Right 06/03/2013   Procedure: RIGHT BREAST CAPSULE CONSTRACTURE;  Surgeon: Theodoro Kos, DO;  Location: [place];  Service: Plastics;  Laterality: Right;   CARPAL TUNNEL RELEASE Bilateral 2008   EYE SURGERY Right    INJECTION KNEE Left 08/12/2018   Procedure: KNEE INJECTION-LEFT;  Surgeon: Corky Mull, MD;  Location: [facility];  Service: Orthopedics;  Laterality: Left;   JOINT REPLACEMENT     LIPOSUCTION Bilateral 01/29/2013   Procedure: LIPOSUCTION;  Surgeon: Theodoro Kos, DO;  Location: [place];  Service: Plastics;  Laterality: Bilateral;   LIPOSUCTION Right 06/03/2013   Procedure: LIPOSUCTION;   Surgeon: Theodoro Kos, DO;  Location: [place];  Service: Plastics;  Laterality: Right;   MASTECTOMY  2012   rt prophalactic mast-snbx   MASTECTOMY MODIFIED RADICAL  2012   left-axillary nodes   NEUROMA SURGERY Bilateral    NOSE SURGERY  2007   PORT-A-CATH REMOVAL     insertion and   THROAT SURGERY     TONSILLECTOMY     TOTAL KNEE ARTHROPLASTY Right 08/12/2018   Procedure: TOTAL KNEE ARTHROPLASTY-RIGHT;  Surgeon: Corky Mull, MD;  Location: [facility];  Service: Orthopedics;  Laterality: Right;   TOTAL KNEE ARTHROPLASTY Left 09/22/2019   Procedure: TOTAL KNEE ARTHROPLASTY;  Surgeon: Corky Mull, MD;  Location: [facility];  Service: Orthopedics;  Laterality: Left;   UVULOPALATOPHARYNGOPLASTY      Home Medications:  Allergies as of 02/05/2022       Reactions   Cephalexin Nausea Only   Diaphoresis / Sweating (intolerance)   Levaquin [levofloxacin] Other (See Comments)   Stiff joints, unable to walk.        Medication List        Accurate as of February 05, 2022 11:53 AM. If you have any questions, ask your nurse or doctor.          alendronate 70 MG tablet Commonly known as: FOSAMAX Take 70 mg by mouth every Saturday. Take with a full glass of water on an empty stomach.   amLODipine 10 MG tablet Commonly known as: NORVASC Take 10 mg by mouth daily.   atenolol 100 MG tablet Commonly known as: TENORMIN Take 100 mg by mouth every other day.   carisoprodol 350 MG tablet Commonly known as: SOMA Take 350 mg by mouth in the morning and at bedtime.   cholecalciferol 25 MCG (1000 UNIT) tablet Commonly known as: VITAMIN D3 Take 1,000 Units by mouth every evening.   COSAMIN DS PO Take 1 tablet by mouth 3 (three) times a week.   fluticasone 50 MCG/ACT nasal spray Commonly known as: FLONASE Place 1 spray into both nostrils daily.   furosemide 20 MG tablet Commonly known as: LASIX Take 20 mg by mouth 2 (two) times daily.   lisinopril-hydrochlorothiazide  20-25 MG tablet Commonly known as: ZESTORETIC Take 1 tablet by mouth every other day.   LYSINE ACETATE PO Take 4,000 mg by mouth daily as needed (cold sores).   magnesium oxide 400 MG tablet Commonly known as: MAG-OX Take 400 mg by mouth daily as needed (leg cramps).   methocarbamol 750 MG tablet Commonly known as: ROBAXIN Take 750 mg by mouth as needed.   omeprazole 20 MG capsule Commonly known as: PRILOSEC Take 20 mg by mouth daily.   oxyCODONE 5 MG immediate release tablet Commonly known as: Oxy IR/ROXICODONE Take 1-2 tablets (5-10  mg total) by mouth every 4 (four) hours as needed for moderate pain.   potassium chloride 10 MEQ tablet Commonly known as: KLOR-CON M Take 10 mEq by mouth in the morning and at bedtime.   predniSONE 10 MG tablet Commonly known as: DELTASONE Take 10 mg by mouth daily.   pregabalin 300 MG capsule Commonly known as: LYRICA Take 300 mg by mouth 2 (two) times daily.   rOPINIRole 1 MG tablet Commonly known as: REQUIP Take 1 mg by mouth at bedtime.   traMADol 50 MG tablet Commonly known as: ULTRAM Take 1-2 tablets (50-100 mg total) by mouth every 6 (six) hours as needed for moderate pain.   triamcinolone 0.025 % cream Commonly known as: KENALOG APPLY 1 APPLICATION TOPICALLY DAILY AS NEEDED (RASH).   zolpidem 10 MG tablet Commonly known as: AMBIEN Take 10 mg by mouth at bedtime.        Allergies:  Allergies  Allergen Reactions   Cephalexin Nausea Only    Diaphoresis / Sweating (intolerance)   Levaquin [Levofloxacin] Other (See Comments)    Stiff joints, unable to walk.    Family History: Family History  Problem Relation Age of Onset   Cancer Mother 54       non hodgkins lymphoma   Cancer Father        neck, throat, lung   Lung cancer Father    Throat cancer Father    Hypertension Sister    Cancer Sister 36       Breast cancer dx'd 04/2011   Hypertension Brother    Breast cancer Maternal Aunt    Liver cancer Maternal  Uncle    Cancer Maternal Grandmother        died 5, unknown cancer   Cancer Maternal Grandfather        died in his 26s; unknown cancer   Lung cancer Maternal Uncle    Throat cancer Maternal Uncle    Breast cancer Maternal Aunt    Cancer Cousin        died under the age 44, unknown cancer   Cancer Cousin        died age 97; unknown cancer    Social History:  reports that she has never smoked. She has never been exposed to tobacco smoke. She has never used smokeless tobacco. She reports current alcohol use. She reports that she does not use drugs.  ROS:                                        Physical Exam: BP 129/77   Pulse 87   Ht '5\' 3"'$  (1.6 m)   Wt 94.3 kg   BMI 36.85 kg/m   Constitutional:  Alert and oriented, No acute distress. HEENT: [place] AT, moist mucus membranes.  Trachea midline, no masses.   Laboratory Data: Lab Results  Component Value Date   WBC 12.3 (H) 09/24/2019   HGB 12.0 09/24/2019   HCT 35.3 (L) 09/24/2019   MCV 101.1 (H) 09/24/2019   PLT 171 09/24/2019    Lab Results  Component Value Date   CREATININE 0.90 04/15/2020    No results found for: "PSA"  No results found for: "TESTOSTERONE"  Lab Results  Component Value Date   HGBA1C 5.7 (H) 04/03/2019    Urinalysis    Component Value Date/Time   COLORURINE YELLOW (A) 09/24/2019 [place] 12/11/2021 0943  LABSPEC 1.010 09/24/2019 1027   LABSPEC 1.005 04/06/2016 0937   PHURINE 6.0 09/24/2019 1027   GLUCOSEU 2+ (A) 12/11/2021 0943   GLUCOSEU Negative 04/06/2016 0937   HGBUR NEGATIVE 09/24/2019 1027   BILIRUBINUR Negative 12/11/2021 0943   BILIRUBINUR Negative 04/06/2016 0937   KETONESUR NEGATIVE 09/24/2019 1027   PROTEINUR Negative 12/11/2021 0943   PROTEINUR NEGATIVE 09/24/2019 1027   UROBILINOGEN 0.2 04/06/2016 0937   NITRITE Negative 12/11/2021 0943   NITRITE POSITIVE (A) 09/24/2019 1027   LEUKOCYTESUR Negative 12/11/2021 0943   LEUKOCYTESUR  SMALL (A) 09/24/2019 1027   LEUKOCYTESUR Negative 04/06/2016 0937    Pertinent Imaging:   Assessment & Plan: Patient has mixed incontinence.  Approximately 80% of problem is overactive bladder with triggering with positional changes and standing.  High-volume leakage due to overactive bladder.  Refractory treatments would be the treatment of choice.  A bulking agent is some to consider if she wanted her stress incontinence treated.  Reassess in 6 weeks on Myrbetriq 50 mg samples and prescription.  Call if urine culture positive.  Consider prophylaxis if culture is positive again  1. Urinary incontinence, unspecified type  - Urinalysis, Complete   No follow-ups on file.  Reece Packer, MD  [facility] [address], [place], [place] [address] [phone]

## 2022-02-06 LAB — URINALYSIS, COMPLETE
Bilirubin, UA: NEGATIVE
Glucose, UA: NEGATIVE
Ketones, UA: NEGATIVE
Leukocytes,UA: NEGATIVE
Nitrite, UA: NEGATIVE
Protein,UA: NEGATIVE
RBC, UA: NEGATIVE
Specific Gravity, UA: 1.015 (ref 1.005–1.030)
Urobilinogen, Ur: 0.2 mg/dL (ref 0.2–1.0)
pH, UA: 7.5 (ref 5.0–7.5)

## 2022-02-06 LAB — MICROSCOPIC EXAMINATION: Bacteria, UA: NONE SEEN

## 2022-02-19 ENCOUNTER — Telehealth: Payer: Self-pay | Admitting: Urology

## 2022-02-19 MED ORDER — OXYBUTYNIN CHLORIDE ER 10 MG PO TB24: 10.0000 mg | ORAL_TABLET | Freq: Every day | ORAL | 11 refills | Status: DC

## 2022-02-19 NOTE — Telephone Encounter (Signed)
Pt called to state that her RX - Myrbetriq samples are not working. She is requesting a different kind of sample that she can pick up. Please call her back [phone]

## 2022-02-19 NOTE — Telephone Encounter (Signed)
Spoke with patient and advised results rx sent to pharmacy by e-script  

## 2022-02-19 NOTE — Telephone Encounter (Signed)
Spoke with patient and she states that the myrbetriq stopped her from urinating. Pt states she takes lasix and using lymphedema pumps twice daily. Would like to try another medication. Please advise

## 2022-02-26 ENCOUNTER — Emergency Department: Payer: Medicare HMO

## 2022-02-26 ENCOUNTER — Inpatient Hospital Stay
Admission: EM | Admit: 2022-02-26 | Discharge: 2022-03-02 | DRG: 308 | Disposition: A | Discharge status: Home Health | Payer: Medicare HMO | Attending: Internal Medicine | Admitting: Internal Medicine

## 2022-02-26 DIAGNOSIS — Z90710 Acquired absence of both cervix and uterus: Secondary | ICD-10-CM

## 2022-02-26 DIAGNOSIS — M797 Fibromyalgia: Secondary | ICD-10-CM | POA: Diagnosis present

## 2022-02-26 DIAGNOSIS — Z96653 Presence of artificial knee joint, bilateral: Secondary | ICD-10-CM | POA: Diagnosis present

## 2022-02-26 DIAGNOSIS — A084 Viral intestinal infection, unspecified: Secondary | ICD-10-CM | POA: Diagnosis present

## 2022-02-26 DIAGNOSIS — N179 Acute kidney failure, unspecified: Secondary | ICD-10-CM | POA: Diagnosis not present

## 2022-02-26 DIAGNOSIS — I129 Hypertensive chronic kidney disease with stage 1 through stage 4 chronic kidney disease, or unspecified chronic kidney disease: Secondary | ICD-10-CM | POA: Diagnosis present

## 2022-02-26 DIAGNOSIS — E869 Volume depletion, unspecified: Secondary | ICD-10-CM | POA: Diagnosis present

## 2022-02-26 DIAGNOSIS — R7989 Other specified abnormal findings of blood chemistry: Secondary | ICD-10-CM | POA: Diagnosis present

## 2022-02-26 DIAGNOSIS — E785 Hyperlipidemia, unspecified: Secondary | ICD-10-CM | POA: Diagnosis present

## 2022-02-26 DIAGNOSIS — R29818 Other symptoms and signs involving the nervous system: Secondary | ICD-10-CM | POA: Diagnosis not present

## 2022-02-26 DIAGNOSIS — Z801 Family history of malignant neoplasm of trachea, bronchus and lung: Secondary | ICD-10-CM

## 2022-02-26 DIAGNOSIS — E860 Dehydration: Secondary | ICD-10-CM | POA: Diagnosis present

## 2022-02-26 DIAGNOSIS — K219 Gastro-esophageal reflux disease without esophagitis: Secondary | ICD-10-CM | POA: Diagnosis present

## 2022-02-26 DIAGNOSIS — R748 Abnormal levels of other serum enzymes: Secondary | ICD-10-CM | POA: Diagnosis present

## 2022-02-26 DIAGNOSIS — Z8249 Family history of ischemic heart disease and other diseases of the circulatory system: Secondary | ICD-10-CM

## 2022-02-26 DIAGNOSIS — E1122 Type 2 diabetes mellitus with diabetic chronic kidney disease: Secondary | ICD-10-CM | POA: Diagnosis present

## 2022-02-26 DIAGNOSIS — G9341 Metabolic encephalopathy: Secondary | ICD-10-CM | POA: Diagnosis present

## 2022-02-26 DIAGNOSIS — N390 Urinary tract infection, site not specified: Secondary | ICD-10-CM | POA: Diagnosis not present

## 2022-02-26 DIAGNOSIS — Z807 Family history of other malignant neoplasms of lymphoid, hematopoietic and related tissues: Secondary | ICD-10-CM

## 2022-02-26 DIAGNOSIS — R197 Diarrhea, unspecified: Secondary | ICD-10-CM | POA: Diagnosis not present

## 2022-02-26 DIAGNOSIS — G2581 Restless legs syndrome: Secondary | ICD-10-CM | POA: Diagnosis present

## 2022-02-26 DIAGNOSIS — N1832 Chronic kidney disease, stage 3b: Secondary | ICD-10-CM | POA: Diagnosis present

## 2022-02-26 DIAGNOSIS — I4891 Unspecified atrial fibrillation: Secondary | ICD-10-CM | POA: Diagnosis present

## 2022-02-26 DIAGNOSIS — G9349 Other encephalopathy: Secondary | ICD-10-CM | POA: Diagnosis not present

## 2022-02-26 DIAGNOSIS — Z803 Family history of malignant neoplasm of breast: Secondary | ICD-10-CM

## 2022-02-26 DIAGNOSIS — Z881 Allergy status to other antibiotic agents status: Secondary | ICD-10-CM

## 2022-02-26 DIAGNOSIS — E876 Hypokalemia: Secondary | ICD-10-CM | POA: Diagnosis not present

## 2022-02-26 DIAGNOSIS — T796XXA Traumatic ischemia of muscle, initial encounter: Secondary | ICD-10-CM | POA: Diagnosis present

## 2022-02-26 DIAGNOSIS — W19XXXA Unspecified fall, initial encounter: Secondary | ICD-10-CM | POA: Diagnosis present

## 2022-02-26 DIAGNOSIS — R000 Tachycardia, unspecified: Secondary | ICD-10-CM | POA: Diagnosis not present

## 2022-02-26 DIAGNOSIS — I4892 Unspecified atrial flutter: Secondary | ICD-10-CM | POA: Diagnosis present

## 2022-02-26 DIAGNOSIS — F1010 Alcohol abuse, uncomplicated: Secondary | ICD-10-CM | POA: Diagnosis present

## 2022-02-26 DIAGNOSIS — F32A Depression, unspecified: Secondary | ICD-10-CM | POA: Diagnosis present

## 2022-02-26 DIAGNOSIS — E871 Hypo-osmolality and hyponatremia: Secondary | ICD-10-CM | POA: Diagnosis present

## 2022-02-26 DIAGNOSIS — R0689 Other abnormalities of breathing: Secondary | ICD-10-CM | POA: Diagnosis not present

## 2022-02-26 DIAGNOSIS — N189 Chronic kidney disease, unspecified: Secondary | ICD-10-CM | POA: Diagnosis present

## 2022-02-26 DIAGNOSIS — I6523 Occlusion and stenosis of bilateral carotid arteries: Secondary | ICD-10-CM | POA: Diagnosis not present

## 2022-02-26 DIAGNOSIS — Z20822 Contact with and (suspected) exposure to covid-19: Secondary | ICD-10-CM | POA: Diagnosis not present

## 2022-02-26 DIAGNOSIS — M25561 Pain in right knee: Secondary | ICD-10-CM | POA: Diagnosis not present

## 2022-02-26 DIAGNOSIS — D696 Thrombocytopenia, unspecified: Secondary | ICD-10-CM | POA: Diagnosis not present

## 2022-02-26 DIAGNOSIS — Z800 Family history of malignant neoplasm of digestive organs: Secondary | ICD-10-CM

## 2022-02-26 DIAGNOSIS — R400 Somnolence: Secondary | ICD-10-CM | POA: Diagnosis not present

## 2022-02-26 DIAGNOSIS — I480 Paroxysmal atrial fibrillation: Secondary | ICD-10-CM | POA: Diagnosis not present

## 2022-02-26 DIAGNOSIS — H5461 Unqualified visual loss, right eye, normal vision left eye: Secondary | ICD-10-CM | POA: Diagnosis present

## 2022-02-26 DIAGNOSIS — Z808 Family history of malignant neoplasm of other organs or systems: Secondary | ICD-10-CM

## 2022-02-26 DIAGNOSIS — Z6837 Body mass index (BMI) 37.0-37.9, adult: Secondary | ICD-10-CM

## 2022-02-26 DIAGNOSIS — I959 Hypotension, unspecified: Secondary | ICD-10-CM | POA: Diagnosis not present

## 2022-02-26 DIAGNOSIS — E872 Acidosis, unspecified: Secondary | ICD-10-CM | POA: Diagnosis present

## 2022-02-26 DIAGNOSIS — M810 Age-related osteoporosis without current pathological fracture: Secondary | ICD-10-CM | POA: Diagnosis present

## 2022-02-26 DIAGNOSIS — R4182 Altered mental status, unspecified: Secondary | ICD-10-CM

## 2022-02-26 DIAGNOSIS — A4151 Sepsis due to Escherichia coli [E. coli]: Secondary | ICD-10-CM | POA: Diagnosis not present

## 2022-02-26 DIAGNOSIS — S80919A Unspecified superficial injury of unspecified knee, initial encounter: Secondary | ICD-10-CM | POA: Diagnosis not present

## 2022-02-26 DIAGNOSIS — R55 Syncope and collapse: Secondary | ICD-10-CM | POA: Diagnosis not present

## 2022-02-26 DIAGNOSIS — Z9010 Acquired absence of unspecified breast and nipple: Secondary | ICD-10-CM

## 2022-02-26 DIAGNOSIS — G9340 Encephalopathy, unspecified: Secondary | ICD-10-CM | POA: Diagnosis not present

## 2022-02-26 DIAGNOSIS — I10 Essential (primary) hypertension: Secondary | ICD-10-CM | POA: Diagnosis present

## 2022-02-26 DIAGNOSIS — R42 Dizziness and giddiness: Secondary | ICD-10-CM | POA: Diagnosis not present

## 2022-02-26 DIAGNOSIS — I7300 Raynaud's syndrome without gangrene: Secondary | ICD-10-CM | POA: Diagnosis present

## 2022-02-26 DIAGNOSIS — E878 Other disorders of electrolyte and fluid balance, not elsewhere classified: Secondary | ICD-10-CM | POA: Diagnosis present

## 2022-02-26 DIAGNOSIS — M6282 Rhabdomyolysis: Secondary | ICD-10-CM | POA: Diagnosis present

## 2022-02-26 DIAGNOSIS — R531 Weakness: Secondary | ICD-10-CM | POA: Diagnosis not present

## 2022-02-26 DIAGNOSIS — G4733 Obstructive sleep apnea (adult) (pediatric): Secondary | ICD-10-CM | POA: Diagnosis present

## 2022-02-26 DIAGNOSIS — M25462 Effusion, left knee: Secondary | ICD-10-CM | POA: Diagnosis not present

## 2022-02-26 DIAGNOSIS — R0602 Shortness of breath: Secondary | ICD-10-CM | POA: Diagnosis not present

## 2022-02-26 LAB — COMPREHENSIVE METABOLIC PANEL
ALT: 30 U/L (ref 0–44)
AST: 72 U/L — ABNORMAL HIGH (ref 15–41)
Albumin: 3 g/dL — ABNORMAL LOW (ref 3.5–5.0)
Alkaline Phosphatase: 67 U/L (ref 38–126)
Anion gap: 13 (ref 5–15)
BUN: 54 mg/dL — ABNORMAL HIGH (ref 8–23)
CO2: 26 mmol/L (ref 22–32)
Calcium: 7.9 mg/dL — ABNORMAL LOW (ref 8.9–10.3)
Chloride: 87 mmol/L — ABNORMAL LOW (ref 98–111)
Creatinine, Ser: 2.84 mg/dL — ABNORMAL HIGH (ref 0.44–1.00)
GFR, Estimated: 18 mL/min — ABNORMAL LOW (ref >60)
Glucose, Bld: 116 mg/dL — ABNORMAL HIGH (ref 70–99)
Potassium: 3.1 mmol/L — ABNORMAL LOW (ref 3.5–5.1)
Sodium: 126 mmol/L — ABNORMAL LOW (ref 135–145)
Total Bilirubin: 2 mg/dL — ABNORMAL HIGH (ref 0.3–1.2)
Total Protein: 6.7 g/dL (ref 6.5–8.1)

## 2022-02-26 LAB — CBC
HCT: 41.1 % (ref 36.0–46.0)
Hemoglobin: 14.5 g/dL (ref 12.0–15.0)
MCH: 34.8 pg — ABNORMAL HIGH (ref 26.0–34.0)
MCHC: 35.3 g/dL (ref 30.0–36.0)
MCV: 98.6 fL (ref 80.0–100.0)
Platelets: 101 10*3/uL — ABNORMAL LOW (ref 150–400)
RBC: 4.17 MIL/uL (ref 3.87–5.11)
RDW: 13.8 % (ref 11.5–15.5)
WBC: 7.6 10*3/uL (ref 4.0–10.5)
nRBC: 0 % (ref 0.0–0.2)

## 2022-02-26 LAB — TROPONIN I (HIGH SENSITIVITY)
Troponin I (High Sensitivity): 17 ng/L (ref <18)
Troponin I (High Sensitivity): 14 ng/L (ref <18)

## 2022-02-26 LAB — MAGNESIUM: Magnesium: 2.1 mg/dL (ref 1.7–2.4)

## 2022-02-26 LAB — LACTIC ACID, PLASMA
Lactic Acid, Venous: 2.2 mmol/L (ref 0.5–1.9)
Lactic Acid, Venous: 1.4 mmol/L (ref 0.5–1.9)

## 2022-02-26 MED ORDER — THIAMINE HCL 100 MG/ML IJ SOLN
100.0000 mg | Freq: Once | INTRAMUSCULAR | Status: AC
  Administered 2022-02-26: 100 mg via INTRAVENOUS
  Filled 2022-02-26: qty 2

## 2022-02-26 MED ORDER — LORAZEPAM 2 MG PO TABS
2.0000 mg | ORAL_TABLET | Freq: Once | ORAL | Status: AC
  Administered 2022-02-26: 2 mg via ORAL
  Filled 2022-02-26: qty 1

## 2022-02-26 MED ORDER — FOLIC ACID 5 MG/ML IJ SOLN
1.0000 mg | Freq: Every day | INTRAMUSCULAR | Status: DC
  Administered 2022-02-27: 1 mg via INTRAVENOUS
  Filled 2022-02-26 (×2): qty 0.2

## 2022-02-26 MED ORDER — POTASSIUM CHLORIDE CRYS ER 20 MEQ PO TBCR
40.0000 meq | EXTENDED_RELEASE_TABLET | Freq: Once | ORAL | Status: AC
  Administered 2022-02-26: 40 meq via ORAL
  Filled 2022-02-26: qty 2

## 2022-02-26 MED ORDER — SODIUM CHLORIDE 0.9 % IV BOLUS
1000.0000 mL | Freq: Once | INTRAVENOUS | Status: AC
  Administered 2022-02-26: 1000 mL via INTRAVENOUS

## 2022-02-26 NOTE — H&P (Incomplete)
History and Physical    Krystal [name] [id number] DOB: 1951/02/19 DOA: 02/26/2022  PCP: [name], MD    Patient coming from:  ***   Chief Complaint:  ***   HPI:  Krystal [name] is a 71 y.o. female seen in ed with complaints of ***     Pt has past medical history of ***     ED Course:   Vitals:   02/26/22 2200 02/26/22 2230 02/26/22 2300 02/26/22 2308  BP: (!) 75/55 (!) 72/51 (!) 91/55 (!) 90/59  Pulse:    (!) 103  Resp: '19 20 18 19  '$ Temp:      TempSrc:      SpO2:    98%  Weight:      Height:       ***  Review of Systems:  ROS    Past Medical History:  Diagnosis Date  . Anemia   . Arthritis    osteo  . Blindness of right eye at birth  . Cancer [facility]) 2011   Left breast 2011 and cervical age 49  . Carpal tunnel syndrome   . Cataract   . Depression   . Edema 07/27/2011  . Fibromyalgia    muscle weakness and pain  . GERD (gastroesophageal reflux disease)   . Hyperlipidemia   . Hypertension    benign  . Lymphedema   . Osteoporosis   . Plantar fasciitis   . Pre-diabetes   . Raynaud's disease   . Restless leg syndrome   . Rosacea   . Rotator cuff rupture   . Sleep apnea   . Synovitis   . Ulcer     Past Surgical History:  Procedure Laterality Date  . ABDOMINAL HYSTERECTOMY  1986   For bleeding and pain  . BLADDER SURGERY  2006   Bladder tack  . BREAST RECONSTRUCTION Bilateral 09/17/2012   Procedure: BREAST RECONSTRUCTION;  Surgeon: Theodoro Kos, DO;  Location: [place];  Service: Plastics;  Laterality: Bilateral;  BILATERAL BREAST RECONSTRUCTION WITH TISSUE EXPANDERS AND ALLOMED  . BREAST RECONSTRUCTION Right 06/03/2013   Procedure: RIGHT BREAST CAPSULE CONSTRACTURE;  Surgeon: Theodoro Kos, DO;  Location: [place];  Service: Plastics;  Laterality: Right;  . CARPAL TUNNEL RELEASE Bilateral 2008  . EYE SURGERY Right   . INJECTION KNEE Left 08/12/2018   Procedure: KNEE INJECTION-LEFT;  Surgeon:  Corky Mull, MD;  Location: [facility];  Service: Orthopedics;  Laterality: Left;  . JOINT REPLACEMENT    . LIPOSUCTION Bilateral 01/29/2013   Procedure: LIPOSUCTION;  Surgeon: Theodoro Kos, DO;  Location: [place];  Service: Plastics;  Laterality: Bilateral;  . LIPOSUCTION Right 06/03/2013   Procedure: LIPOSUCTION;  Surgeon: Theodoro Kos, DO;  Location: [place];  Service: Plastics;  Laterality: Right;  . MASTECTOMY  2012   rt prophalactic mast-snbx  . MASTECTOMY MODIFIED RADICAL  2012   left-axillary nodes  . NEUROMA SURGERY Bilateral   . NOSE SURGERY  2007  . PORT-A-CATH REMOVAL     insertion and  . THROAT SURGERY    . TONSILLECTOMY    . TOTAL KNEE ARTHROPLASTY Right 08/12/2018   Procedure: TOTAL KNEE ARTHROPLASTY-RIGHT;  Surgeon: Corky Mull, MD;  Location: [facility];  Service: Orthopedics;  Laterality: Right;  . TOTAL KNEE ARTHROPLASTY Left 09/22/2019   Procedure: TOTAL KNEE ARTHROPLASTY;  Surgeon: Corky Mull, MD;  Location: [facility];  Service: Orthopedics;  Laterality: Left;  . UVULOPALATOPHARYNGOPLASTY  reports that she has never smoked. She has never been exposed to tobacco smoke. She has never used smokeless tobacco. She reports current alcohol use. She reports that she does not use drugs.  Allergies  Allergen Reactions  . Cephalexin Nausea Only    Diaphoresis / Sweating (intolerance)  . Levaquin [Levofloxacin] Other (See Comments)    Stiff joints, unable to walk.    Family History  Problem Relation Age of Onset  . Cancer Mother 62       non hodgkins lymphoma  . Cancer Father        neck, throat, lung  . Lung cancer Father   . Throat cancer Father   . Hypertension Sister   . Cancer Sister 16       Breast cancer dx'd 04/2011  . Hypertension Brother   . Breast cancer Maternal Aunt   . Liver cancer Maternal Uncle   . Cancer Maternal Grandmother        died 32, unknown cancer  . Cancer Maternal Grandfather        died in  his 39s; unknown cancer  . Lung cancer Maternal Uncle   . Throat cancer Maternal Uncle   . Breast cancer Maternal Aunt   . Cancer Cousin        died under the age 75, unknown cancer  . Cancer Cousin        died age 44; unknown cancer    Prior to Admission medications   Medication Sig Start Date End Date Taking? Authorizing Provider  alendronate (FOSAMAX) 70 MG tablet Take 70 mg by mouth every Saturday. Take with a full glass of water on an empty stomach.     [provider]  amLODipine (NORVASC) 10 MG tablet Take 10 mg by mouth daily.  10/27/12   [provider]  atenolol (TENORMIN) 100 MG tablet Take 100 mg by mouth every other day.     [provider]  carisoprodol (SOMA) 350 MG tablet Take 350 mg by mouth in the morning and at bedtime.    [provider]  cholecalciferol (VITAMIN D3) 25 MCG (1000 UT) tablet Take 1,000 Units by mouth every evening.     [provider]  fluticasone (FLONASE) 50 MCG/ACT nasal spray Place 1 spray into both nostrils daily.    [provider]  furosemide (LASIX) 20 MG tablet Take 20 mg by mouth 2 (two) times daily.     [provider]  Glucosamine-Chondroitin (COSAMIN DS PO) Take 1 tablet by mouth 3 (three) times a week.    [provider]  lisinopril-hydrochlorothiazide (PRINZIDE,ZESTORETIC) 20-25 MG per tablet Take 1 tablet by mouth every other day.  10/27/12   [provider]  LYSINE ACETATE PO Take 4,000 mg by mouth daily as needed (cold sores).     [provider]  magnesium oxide (MAG-OX) 400 MG tablet Take 400 mg by mouth daily as needed (leg cramps).    [provider]  methocarbamol (ROBAXIN) 750 MG tablet Take 750 mg by mouth as needed. 03/21/20   [provider]  omeprazole (PRILOSEC) 20 MG capsule Take 20 mg by mouth daily. 08/31/19   [provider]  oxybutynin (DITROPAN-XL) 10 MG 24 hr tablet Take 1 tablet (10 mg total) by mouth at  bedtime. 02/19/22   Bjorn Loser, MD  oxyCODONE (OXY IR/ROXICODONE) 5 MG immediate release tablet Take 1-2 tablets (5-10 mg total) by mouth every 4 (four) hours as needed for moderate pain. 09/23/19  Lattie Corns, PA-C  potassium chloride (KLOR-CON) 10 MEQ tablet Take 10 mEq by mouth in the morning and at bedtime. 08/02/19   [provider]  predniSONE (DELTASONE) 10 MG tablet Take 10 mg by mouth daily. 02/25/20   [provider]  pregabalin (LYRICA) 300 MG capsule Take 300 mg by mouth 2 (two) times daily.      [provider]  rOPINIRole (REQUIP) 1 MG tablet Take 1 mg by mouth at bedtime.     [provider]  traMADol (ULTRAM) 50 MG tablet Take 1-2 tablets (50-100 mg total) by mouth every 6 (six) hours as needed for moderate pain. 09/23/19   Lattie Corns, PA-C  triamcinolone (KENALOG) 0.025 % cream APPLY 1 APPLICATION TOPICALLY DAILY AS NEEDED (RASH). 10/31/21   Gardenia Phlegm, NP  zolpidem (AMBIEN) 10 MG tablet Take 10 mg by mouth at bedtime.    [provider]    Physical Exam: Vitals:   02/26/22 2200 02/26/22 2230 02/26/22 2300 02/26/22 2308  BP: (!) 75/55 (!) 72/51 (!) 91/55 (!) 90/59  Pulse:    (!) 103  Resp: '19 20 18 19  '$ Temp:      TempSrc:      SpO2:    98%  Weight:      Height:       Physical Exam   Labs on Admission: I have personally reviewed following labs and imaging studies BMET Recent Labs  Lab 02/26/22 2054  NA 126*  K 3.1*  CL 87*  CO2 26  BUN 54*  CREATININE 2.84*  GLUCOSE 116*   Electrolytes Recent Labs  Lab 02/26/22 2054 02/26/22 2234  CALCIUM 7.9*  --   MG  --  2.1   Sepsis Markers Recent Labs  Lab 02/26/22 2054 02/26/22 2234  LATICACIDVEN 2.2* 1.4   ABG No results for input(s): "PHART", "PCO2ART", "PO2ART" in the last 168 hours. Liver Enzymes Recent Labs  Lab 02/26/22 2054  AST 72*  ALT 30  ALKPHOS 67  BILITOT 2.0*  ALBUMIN 3.0*   Cardiac Enzymes No results for  input(s): "TROPONINI", "PROBNP" in the last 168 hours. No results found for: "DDIMER" Coag's No results for input(s): "APTT", "INR" in the last 168 hours.  No results found for this or any previous visit (from the past 240 hour(s)).   Current Facility-Administered Medications:  .  [START ON 02/23/6961] folic acid injection 1 mg, 1 mg, Intravenous, Daily, Rada Hay, MD  Current Outpatient Medications:  .  alendronate (FOSAMAX) 70 MG tablet, Take 70 mg by mouth every Saturday. Take with a full glass of water on an empty stomach. , Disp: , Rfl:  .  amLODipine (NORVASC) 10 MG tablet, Take 10 mg by mouth daily. , Disp: , Rfl:  .  atenolol (TENORMIN) 100 MG tablet, Take 100 mg by mouth every other day. , Disp: , Rfl:  .  carisoprodol (SOMA) 350 MG tablet, Take 350 mg by mouth in the morning and at bedtime., Disp: , Rfl:  .  cholecalciferol (VITAMIN D3) 25 MCG (1000 UT) tablet, Take 1,000 Units by mouth every evening. , Disp: , Rfl:  .  fluticasone (FLONASE) 50 MCG/ACT nasal spray, Place 1 spray into both nostrils daily., Disp: , Rfl:  .  furosemide (LASIX) 20 MG tablet, Take 20 mg by mouth 2 (two) times daily. , Disp: , Rfl:  .  Glucosamine-Chondroitin (COSAMIN DS PO), Take 1 tablet by mouth 3 (three) times a week., Disp: , Rfl:  .  lisinopril-hydrochlorothiazide (PRINZIDE,ZESTORETIC) 20-25 MG per tablet, Take 1 tablet by mouth every other day. , Disp: , Rfl:  .  LYSINE ACETATE PO, Take 4,000 mg by mouth daily as needed (cold sores). , Disp: , Rfl:  .  magnesium oxide (MAG-OX) 400 MG tablet, Take 400 mg by mouth daily as needed (leg cramps)., Disp: , Rfl:  .  methocarbamol (ROBAXIN) 750 MG tablet, Take 750 mg by mouth as needed., Disp: , Rfl:  .  omeprazole (PRILOSEC) 20 MG capsule, Take 20 mg by mouth daily., Disp: , Rfl:  .  oxybutynin (DITROPAN-XL) 10 MG 24 hr tablet, Take 1 tablet (10 mg total) by mouth at bedtime., Disp: 30 tablet, Rfl: 11 .  oxyCODONE (OXY IR/ROXICODONE) 5 MG  immediate release tablet, Take 1-2 tablets (5-10 mg total) by mouth every 4 (four) hours as needed for moderate pain., Disp: 60 tablet, Rfl: 0 .  potassium chloride (KLOR-CON) 10 MEQ tablet, Take 10 mEq by mouth in the morning and at bedtime., Disp: , Rfl:  .  predniSONE (DELTASONE) 10 MG tablet, Take 10 mg by mouth daily., Disp: , Rfl:  .  pregabalin (LYRICA) 300 MG capsule, Take 300 mg by mouth 2 (two) times daily.  , Disp: , Rfl:  .  rOPINIRole (REQUIP) 1 MG tablet, Take 1 mg by mouth at bedtime. , Disp: , Rfl:  .  traMADol (ULTRAM) 50 MG tablet, Take 1-2 tablets (50-100 mg total) by mouth every 6 (six) hours as needed for moderate pain., Disp: 40 tablet, Rfl: 0 .  triamcinolone (KENALOG) 0.025 % cream, APPLY 1 APPLICATION TOPICALLY DAILY AS NEEDED (RASH)., Disp: 30 g, Rfl: 0 .  zolpidem (AMBIEN) 10 MG tablet, Take 10 mg by mouth at bedtime., Disp: , Rfl:   COVID-19 Labs No results for input(s): "DDIMER", "FERRITIN", "LDH", "CRP" in the last 72 hours. Lab Results  Component Value Date   Blunt NEGATIVE 09/18/2019    Radiological Exams on Admission: DG Chest Portable 1 View  Result Date: 02/26/2022 CLINICAL DATA:  Shortness of breath.  Weakness. EXAM: PORTABLE CHEST 1 VIEW COMPARISON:  04/15/2020 FINDINGS: Upper normal heart size.The cardiomediastinal contours are normal. The lungs are clear. Pulmonary vasculature is normal. No consolidation, pleural effusion, or pneumothorax. No acute osseous abnormalities are seen. Her shoulder arthropathy. Surgical clips in the left axilla. IMPRESSION: No acute chest findings. Electronically Signed   By: Keith Rake M.D.   On: 02/26/2022 22:39   CT HEAD WO CONTRAST (5MM)  Result Date: 02/26/2022 CLINICAL DATA:  Mental status change, unknown cause, unwitnessed fall EXAM: CT HEAD WITHOUT CONTRAST TECHNIQUE: Contiguous axial images were obtained from the base of the skull through the vertex without intravenous contrast. RADIATION DOSE REDUCTION: This  exam was performed according to the departmental dose-optimization program which includes automated exposure control, adjustment of the mA and/or kV according to patient size and/or use of iterative reconstruction technique. COMPARISON:  None Available. FINDINGS: Brain: No acute intracranial abnormality. Specifically, no hemorrhage, hydrocephalus, mass lesion, acute infarction, or significant intracranial injury. Vascular: No hyperdense vessel or unexpected calcification. Skull: No acute calvarial abnormality. Sinuses/Orbits: No acute findings Other: None IMPRESSION: No acute intracranial abnormality. Electronically Signed   By: Rolm Baptise M.D.   On: 02/26/2022 21:21   DG Knee Complete 4 Views Right  Result Date: 02/26/2022 CLINICAL DATA:  Fall, pain EXAM: RIGHT KNEE - COMPLETE 4+ VIEW COMPARISON:  08/12/2018 FINDINGS: Prior right knee replacement. No acute bony abnormality. Specifically, no fracture, subluxation, or dislocation. No hardware complicating  feature. No joint effusion. Soft tissues are intact. IMPRESSION: No acute bony abnormality. Electronically Signed   By: Rolm Baptise M.D.   On: 02/26/2022 21:16   DG Knee Complete 4 Views Left  Result Date: 02/26/2022 CLINICAL DATA:  Weakness, fall EXAM: LEFT KNEE - COMPLETE 4+ VIEW COMPARISON:  09/22/2019 FINDINGS: Prior left knee replacement. No hardware complicating feature. Insert bones small joint effusion. IMPRESSION: No acute bony abnormality.  Small joint effusion. Electronically Signed   By: Rolm Baptise M.D.   On: 02/26/2022 21:15    EKG: Independently reviewed.  ***   Assessment and Plan: No notes have been filed under this hospital service. Service: Hospitalist      ***     DVT prophylaxis:  ***   Code Status:  ***   Family Communication:  Kossman,Douglas H (Spouse)  [phone] (Mobile)   Disposition Plan:  ***   Consults called:  ***  Admission status: ***       Para Skeans MD [facility]  6 PM- 2  AM. Please contact me via secure Chat 6 PM-2 AM. [phone] ( Pager ) To contact the [facility] Attending or Consulting provider [place] or covering provider during after hours [place], for this patient.   Check the care team in [facility] and look for a) attending/consulting [facility] provider listed and b) the [facility] team listed Log into [email or web address] and use [place]'s universal password to access. If you do not have the password, please contact the hospital operator. Locate the [facility] provider you are looking for under [facility] and page to a number that you can be directly reached. If you still have difficulty reaching the provider, please page the [facility] (Director on Call) for the Hospitalists listed on amion for assistance. [email or web address] 02/26/2022, 11:51 PM

## 2022-02-26 NOTE — ED Provider Notes (Signed)
[facility] Provider Note    Event Date/Time   First MD Initiated Contact with Patient 02/26/22 2139     (approximate)   History   Altered Mental Status   HPI  Krystal [name] is a 71 y.o. female with past medical history of hypertension hyperlipidemia GERD alcohol use disorder presents with altered mental status.  Patient is accompanied by her husband who says that he was away for the last 4 days.  She has not been feeling well for the last 3 days.  Has had copious amounts of diarrhea.  Has not been able to make it to the bathroom on multiple occasions so when he got home today apparently there was stool all over the house.  She denies urinary symptoms denies abdominal pain or blood in her stool.  Denies respiratory symptoms.    Past Medical History:  Diagnosis Date   Anemia    Arthritis    osteo   Blindness of right eye at birth   Cancer [facility]) 2011   Left breast 2011 and cervical age 24   Carpal tunnel syndrome    Cataract    Depression    Edema 07/27/2011   Fibromyalgia    muscle weakness and pain   GERD (gastroesophageal reflux disease)    Hyperlipidemia    Hypertension    benign   Lymphedema    Osteoporosis    Plantar fasciitis    Pre-diabetes    Raynaud's disease    Restless leg syndrome    Rosacea    Rotator cuff rupture    Sleep apnea    Synovitis    Ulcer     Patient Active Problem List   Diagnosis Date Noted   Status post total knee replacement using cement, left 09/22/2019   Status post total knee replacement using cement, right 08/12/2018   Rotator cuff rupture    Acquired absence of bilateral breasts and nipples 09/17/2012   Edema 07/27/2011   Breast cancer of lower-outer quadrant of left female breast ([place]) 01/16/2011     Physical Exam  Triage Vital Signs: ED Triage Vitals  Enc Vitals Group     BP 02/26/22 2035 (!) 110/56     Pulse Rate 02/26/22 2035 (!) 110     Resp 02/26/22 2035 18     Temp 02/26/22 2035 100.1  F (37.8 C)     Temp Source 02/26/22 2035 Oral     SpO2 02/26/22 2035 98 %     Weight 02/26/22 2037 209 lb 7 oz (95 kg)     Height 02/26/22 2033 '5\' 3"'$  (1.6 m)     Head Circumference --      Peak Flow --      Pain Score 02/26/22 2033 5     Pain Loc --      Pain Edu? --      Excl. in [place]? --     Most recent vital signs: Vitals:   02/26/22 2035  BP: (!) 110/56  Pulse: (!) 110  Resp: 18  Temp: 100.1 F (37.8 C)  SpO2: 98%     General: Awake, no distress.  CV:  Good peripheral perfusion.  No peripheral edema Resp:  Normal effort.  No increased work of breathing Abd:  No distention.  Abdomen is soft throughout Neuro:             Awake, Alert, Oriented x 3  Other:  Patient is somewhat jittery has difficulty sitting still but is not  overtly tremulous, left-sided anisocoria but pupils are equally reactive EOMI, 5/5 strength in bilateral upper and lower extremities Some difficulty with finger-nose-finger and bilateral upper extremities   ED Results / Procedures / Treatments  Labs (all labs ordered are listed, but only abnormal results are displayed) Labs Reviewed  COMPREHENSIVE METABOLIC PANEL - Abnormal; Notable for the following components:      Result Value   Sodium 126 (*)    Potassium 3.1 (*)    Chloride 87 (*)    Glucose, Bld 116 (*)    BUN 54 (*)    Creatinine, Ser 2.84 (*)    Calcium 7.9 (*)    Albumin 3.0 (*)    AST 72 (*)    Total Bilirubin 2.0 (*)    GFR, Estimated 18 (*)    All other components within normal limits  CBC - Abnormal; Notable for the following components:   MCH 34.8 (*)    Platelets 101 (*)    All other components within normal limits  LACTIC ACID, PLASMA - Abnormal; Notable for the following components:   Lactic Acid, Venous 2.2 (*)    All other components within normal limits  CULTURE, BLOOD (ROUTINE X 2)  CULTURE, BLOOD (ROUTINE X 2)  URINE CULTURE  GASTROINTESTINAL PANEL BY PCR, STOOL (REPLACES STOOL CULTURE)  LACTIC ACID, PLASMA   URINALYSIS, ROUTINE W REFLEX MICROSCOPIC  MAGNESIUM  CBG MONITORING, ED  TROPONIN I (HIGH SENSITIVITY)  TROPONIN I (HIGH SENSITIVITY)     EKG  EKG shows atrial flutter with variable block normal axis no acute ischemic changes   RADIOLOGY I reviewed and interpreted the CT scan of the brain which does not show any acute intracranial process    PROCEDURES:  Critical Care performed: Yes, see critical care procedure note(s)  .Critical Care  Performed by: Rada Hay, MD Authorized by: Rada Hay, MD   Critical care provider statement:    Critical care time (minutes):  30   Critical care was time spent personally by me on the following activities:  Development of treatment plan with patient or surrogate, discussions with consultants, evaluation of patient's response to treatment, examination of patient, ordering and review of laboratory studies, ordering and review of radiographic studies, ordering and performing treatments and interventions, pulse oximetry, re-evaluation of patient's condition and review of old charts .1-3 Lead EKG Interpretation  Performed by: Rada Hay, MD Authorized by: Rada Hay, MD     Interpretation: abnormal     ECG rate assessment: tachycardic     Rhythm: atrial fibrillation     Ectopy: none     Conduction: normal     The patient is on the cardiac monitor to evaluate for evidence of arrhythmia and/or significant heart rate changes.   MEDICATIONS ORDERED IN ED: Medications  folic acid injection 1 mg (has no administration in time range)  potassium chloride SA (KLOR-CON M) CR tablet 40 mEq (40 mEq Oral Given 02/26/22 2227)  sodium chloride 0.9 % bolus 1,000 mL (1,000 mLs Intravenous New Bag/Given 02/26/22 2227)  thiamine (VITAMIN B1) injection 100 mg (100 mg Intravenous Given 02/26/22 2228)  LORazepam (ATIVAN) tablet 2 mg (2 mg Oral Given 02/26/22 2227)     IMPRESSION / MDM / ASSESSMENT AND PLAN / ED COURSE  I reviewed  the triage vital signs and the nursing notes.  Patient's presentation is most consistent with acute presentation with potential threat to life or bodily function.  Differential diagnosis includes, but is not limited to, intracranial hemorrhage, viral illness, gastroenteritis, dehydration, AKI, renal failure, alcohol withdrawal, cirrhosis  Patient is a 71 year old female with past medical history of alcohol use disorder presents with altered mental status diarrhea.  Patient has been home alone because her husband was out of town for the last 4 days.  Has had copious amounts of diarrhea.  Her last drink was on Sunday some time.  Denies history of withdrawal.  Apparently when he got home there was stool all over the floor.  Patient then had a fall today in the bathroom after her husband had returned.  She denies abdominal pain respiratory symptoms chest pain shortness of breath etc.  She says she currently feels fine and would like to go home.  Patient has low-grade fever of 100.1, she is tachycardic and blood pressure borderline low.  Patient overall appears nontoxic.  She is somewhat jittery has difficulty sitting still but is not overtly tremulous she has some difficulty with finger-nose bilaterally but her neurologic exam is otherwise nonfocal.  CT head is negative x-rays of the bilateral knees were obtained from triage which do not show any acute injury.  Labs are notable for hyponatremia with sodium of 126 she is hypokalemic with potassium of 3.1 and has an AKI with creatinine 2.8.  T. bili is 2.  Initial lactate is 2.2.  I suspect that she is volume depleted will give a liter of saline we will also give thiamine folate and 2 mg of p.o. Ativan because I am suspecting some mild alcohol withdrawal.  Her EKG shows a flutter with variable block which I suspect is in the setting of both alcohol use and dehydration.  Rates are in the low 110s we will treat supportively and see if  this improves her rates prior to giving any specific rate control.  Patient will need admission.     FINAL CLINICAL IMPRESSION(S) / ED DIAGNOSES   Final diagnoses:  Hypokalemia  Altered mental status, unspecified altered mental status type  AKI (acute kidney injury) ([place])     Rx / DC Orders   ED Discharge Orders     None        Note:  This document was prepared using Dragon voice recognition software and may include unintentional dictation errors.   Rada Hay, MD 02/26/22 2233

## 2022-02-26 NOTE — ED Triage Notes (Signed)
Pt presents via POV with complaints of an unwitnessed fall per family. Pt was found at home in the bed by family but there was a mess all over the house where she had voided. Pt is complaining of bilateral knee pain L>R. Pt is alert is self, place, and situation - not to time.  Denies CP or SOB.  20GIV RAC

## 2022-02-26 NOTE — H&P (Signed)
History and Physical    Krystal [name] IRW:[id number] DOB: Feb 23, 1951 DOA: 02/26/2022  PCP: [name], MD    Patient coming from:  Home   Chief Complaint:  Altered mental status   HPI:  Krystal [name] is a 71 y.o. female seen in ed with complaints of altered mental status. Husband at bedside states that he was away for the last few days patient was on the floor with stool all over the house with copious amounts of diarrhea patient unable to make it to the room and seemed.  Patient was disoriented.  On my exam initial HPI was otherwise limited secondary to patient's initial mental status.  While examining patient telemetry monitor showed A-fib with hypotension.  Discussed with husband about irregular heart rhythm or heart failure patient states that she is being followed by cardiology and has not had any issues or heart failure. He did report that patient was noted to have irregular heart beat by cardiology team.  Pt has past medical history of allergy to Keflex and Levaquin. Patient also has past medical history of anemia, carpal tunnel syndrome, arthritis, depression, GERD, hypertension, hyperlipidemia, recent mellitus, sleep apnea, obesity. ED Course:   Vitals:   02/27/22 0130 02/27/22 0200 02/27/22 0210 02/27/22 0211  BP: (!) 78/63 (!) 87/47 (!) 78/45 (!) 83/48  Pulse: 86 93 93 96  Resp: '18 18 19 17  '$ Temp:      TempSrc:      SpO2: 95%   98%  Weight:      Height:      In the emergency room patient is somnolent hypotensive tachycardic initially afebrile found to have a low-grade temp of 99.4. Initial head CT was negative for any intracranial abnormalities. Initial blood work showed electrolyte abnormalities of hyponatremia hypokalemia hypocalcemia with acute kidney injury initial lactic of 2.2. CPK was elevated at 457. Platelet count was 101,000 with a white count normal hemoglobin.  Repeat blood work showed reduction in platelet count 88,000. Patient was given an  additional 1-1/2 L bolus ICU provider contacted for management for possible vasopressor therapy overnight.  Patient's blood pressure was not responding to liters bolus and heart rate was 88-suspect patient was A-fib RVR hypertension. Patient does meet sepsis criteria although infection is not clearing as a source. Blood cultures are obtained in the emergency room. With the recent temperature of 99.4 cover patient empirically with doxycycline and aztreonam.    Review of Systems:  Review of Systems  Reason unable to perform ROS: Somnolent.    Past Medical History:  Diagnosis Date   Anemia    Arthritis    osteo   Blindness of right eye at birth   Cancer [facility]) 2011   Left breast 2011 and cervical age 42   Carpal tunnel syndrome    Cataract    Depression    Edema 07/27/2011   Fibromyalgia    muscle weakness and pain   GERD (gastroesophageal reflux disease)    Hyperlipidemia    Hypertension    benign   Lymphedema    Osteoporosis    Plantar fasciitis    Pre-diabetes    Raynaud's disease    Restless leg syndrome    Rosacea    Rotator cuff rupture    Sleep apnea    Synovitis    Ulcer     Past Surgical History:  Procedure Laterality Date   ABDOMINAL HYSTERECTOMY  1986   For bleeding and pain   BLADDER SURGERY  2006   Bladder  tack   BREAST RECONSTRUCTION Bilateral 09/17/2012   Procedure: BREAST RECONSTRUCTION;  Surgeon: Theodoro Kos, DO;  Location: [place];  Service: Plastics;  Laterality: Bilateral;  BILATERAL BREAST RECONSTRUCTION WITH TISSUE EXPANDERS AND ALLOMED   BREAST RECONSTRUCTION Right 06/03/2013   Procedure: RIGHT BREAST CAPSULE CONSTRACTURE;  Surgeon: Theodoro Kos, DO;  Location: [place];  Service: Plastics;  Laterality: Right;   CARPAL TUNNEL RELEASE Bilateral 2008   EYE SURGERY Right    INJECTION KNEE Left 08/12/2018   Procedure: KNEE INJECTION-LEFT;  Surgeon: Corky Mull, MD;  Location: [facility];  Service: Orthopedics;   Laterality: Left;   JOINT REPLACEMENT     LIPOSUCTION Bilateral 01/29/2013   Procedure: LIPOSUCTION;  Surgeon: Theodoro Kos, DO;  Location: [place];  Service: Plastics;  Laterality: Bilateral;   LIPOSUCTION Right 06/03/2013   Procedure: LIPOSUCTION;  Surgeon: Theodoro Kos, DO;  Location: [place];  Service: Plastics;  Laterality: Right;   MASTECTOMY  2012   rt prophalactic mast-snbx   MASTECTOMY MODIFIED RADICAL  2012   left-axillary nodes   NEUROMA SURGERY Bilateral    NOSE SURGERY  2007   PORT-A-CATH REMOVAL     insertion and   THROAT SURGERY     TONSILLECTOMY     TOTAL KNEE ARTHROPLASTY Right 08/12/2018   Procedure: TOTAL KNEE ARTHROPLASTY-RIGHT;  Surgeon: Corky Mull, MD;  Location: [facility];  Service: Orthopedics;  Laterality: Right;   TOTAL KNEE ARTHROPLASTY Left 09/22/2019   Procedure: TOTAL KNEE ARTHROPLASTY;  Surgeon: Corky Mull, MD;  Location: [facility];  Service: Orthopedics;  Laterality: Left;   UVULOPALATOPHARYNGOPLASTY       reports that she has never smoked. She has never been exposed to tobacco smoke. She has never used smokeless tobacco. She reports current alcohol use. She reports that she does not use drugs.  Allergies  Allergen Reactions   Cephalexin Nausea Only    Diaphoresis / Sweating (intolerance)   Levaquin [Levofloxacin] Other (See Comments)    Stiff joints, unable to walk.    Family History  Problem Relation Age of Onset   Cancer Mother 86       non hodgkins lymphoma   Cancer Father        neck, throat, lung   Lung cancer Father    Throat cancer Father    Hypertension Sister    Cancer Sister 17       Breast cancer dx'd 04/2011   Hypertension Brother    Breast cancer Maternal Aunt    Liver cancer Maternal Uncle    Cancer Maternal Grandmother        died 63, unknown cancer   Cancer Maternal Grandfather        died in his 63s; unknown cancer   Lung cancer Maternal Uncle    Throat cancer Maternal Uncle     Breast cancer Maternal Aunt    Cancer Cousin        died under the age 59, unknown cancer   Cancer Cousin        died age 64; unknown cancer    Prior to Admission medications   Medication Sig Start Date End Date Taking? Authorizing Provider  alendronate (FOSAMAX) 70 MG tablet Take 70 mg by mouth every Saturday. Take with a full glass of water on an empty stomach.     [provider]  amLODipine (NORVASC) 10 MG tablet Take 10 mg by mouth daily.  10/27/12   [provider]  atenolol (TENORMIN) 100 MG tablet Take 100 mg by mouth every other day.     [provider]  carisoprodol (SOMA) 350 MG tablet Take 350 mg by mouth in the morning and at bedtime.    [provider]  cholecalciferol (VITAMIN D3) 25 MCG (1000 UT) tablet Take 1,000 Units by mouth every evening.     [provider]  fluticasone (FLONASE) 50 MCG/ACT nasal spray Place 1 spray into both nostrils daily.    [provider]  furosemide (LASIX) 20 MG tablet Take 20 mg by mouth 2 (two) times daily.     [provider]  Glucosamine-Chondroitin (COSAMIN DS PO) Take 1 tablet by mouth 3 (three) times a week.    [provider]  lisinopril-hydrochlorothiazide (PRINZIDE,ZESTORETIC) 20-25 MG per tablet Take 1 tablet by mouth every other day.  10/27/12   [provider]  LYSINE ACETATE PO Take 4,000 mg by mouth daily as needed (cold sores).     [provider]  magnesium oxide (MAG-OX) 400 MG tablet Take 400 mg by mouth daily as needed (leg cramps).    [provider]  methocarbamol (ROBAXIN) 750 MG tablet Take 750 mg by mouth as needed. 03/21/20   [provider]  omeprazole (PRILOSEC) 20 MG capsule Take 20 mg by mouth daily. 08/31/19   [provider]  oxybutynin (DITROPAN-XL) 10 MG 24 hr tablet Take 1 tablet (10 mg total) by mouth at bedtime. 02/19/22   Bjorn Loser, MD  oxyCODONE (OXY IR/ROXICODONE) 5 MG immediate release tablet  Take 1-2 tablets (5-10 mg total) by mouth every 4 (four) hours as needed for moderate pain. 09/23/19   Lattie Corns, PA-C  potassium chloride (KLOR-CON) 10 MEQ tablet Take 10 mEq by mouth in the morning and at bedtime. 08/02/19   [provider]  predniSONE (DELTASONE) 10 MG tablet Take 10 mg by mouth daily. 02/25/20   [provider]  pregabalin (LYRICA) 300 MG capsule Take 300 mg by mouth 2 (two) times daily.      [provider]  rOPINIRole (REQUIP) 1 MG tablet Take 1 mg by mouth at bedtime.     [provider]  traMADol (ULTRAM) 50 MG tablet Take 1-2 tablets (50-100 mg total) by mouth every 6 (six) hours as needed for moderate pain. 09/23/19   Lattie Corns, PA-C  triamcinolone (KENALOG) 0.025 % cream APPLY 1 APPLICATION TOPICALLY DAILY AS NEEDED (RASH). 10/31/21   Gardenia Phlegm, NP  zolpidem (AMBIEN) 10 MG tablet Take 10 mg by mouth at bedtime.    [provider]    Physical Exam: Vitals:   02/27/22 0130 02/27/22 0200 02/27/22 0210 02/27/22 0211  BP: (!) 78/63 (!) 87/47 (!) 78/45 (!) 83/48  Pulse: 86 93 93 96  Resp: '18 18 19 17  '$ Temp:      TempSrc:      SpO2: 95%   98%  Weight:      Height:       Physical Exam Vitals and nursing note reviewed.  Constitutional:      General: She is not in acute distress.    Appearance: She is not ill-appearing, toxic-appearing or diaphoretic.  HENT:     Head: Normocephalic and atraumatic.     Right Ear: Hearing and external ear normal.     Left Ear: Hearing and external ear normal.     Nose: Nose normal. No nasal deformity.     Mouth/Throat:  Lips: Pink.     Mouth: Mucous membranes are moist.     Tongue: No lesions.     Pharynx: Oropharynx is clear.  Eyes:     Extraocular Movements: Extraocular movements intact.     Pupils: Pupils are equal, round, and reactive to light.  Cardiovascular:     Rate and Rhythm: Normal rate. Rhythm irregular.     Pulses: Normal pulses.      Heart sounds: Normal heart sounds.  Pulmonary:     Effort: Pulmonary effort is normal.     Breath sounds: Normal breath sounds.  Abdominal:     General: Bowel sounds are normal. There is no distension.     Palpations: Abdomen is soft. There is no mass.     Tenderness: There is no abdominal tenderness. There is no guarding.     Hernia: No hernia is present.  Musculoskeletal:     Right lower leg: No edema.     Left lower leg: No edema.  Skin:    General: Skin is warm.  Neurological:     General: No focal deficit present.     Mental Status: She is alert and oriented to person, place, and time.     Cranial Nerves: Cranial nerves 2-12 are intact.     Motor: Motor function is intact.  Psychiatric:        Attention and Perception: Attention normal.        Speech: Speech normal.        Behavior: Behavior is cooperative.        Cognition and Memory: Cognition normal.      Labs on Admission: I have personally reviewed following labs and imaging studies BMET Recent Labs  Lab 02/26/22 2054  NA 126*  K 3.1*  CL 87*  CO2 26  BUN 54*  CREATININE 2.84*  GLUCOSE 116*   Electrolytes Recent Labs  Lab 02/26/22 2054 02/26/22 2234  CALCIUM 7.9*  --   MG  --  2.1   Sepsis Markers Recent Labs  Lab 02/26/22 2054 02/26/22 2234  LATICACIDVEN 2.2* 1.4   ABG No results for input(s): "PHART", "PCO2ART", "PO2ART" in the last 168 hours. Liver Enzymes Recent Labs  Lab 02/26/22 2054  AST 72*  ALT 30  ALKPHOS 67  BILITOT 2.0*  ALBUMIN 3.0*   Cardiac Enzymes No results for input(s): "TROPONINI", "PROBNP" in the last 168 hours. No results found for: "DDIMER" Coag's No results for input(s): "APTT", "INR" in the last 168 hours.  Recent Results (from the past 240 hour(s))  Resp Panel by RT-PCR (Flu A&B, Covid) Anterior Nasal Swab     Status: None   Collection Time: 02/27/22 12:09 AM   Specimen: Anterior Nasal Swab  Result Value Ref Range Status   SARS Coronavirus 2 by RT PCR  NEGATIVE NEGATIVE Final    Comment: (NOTE) SARS-CoV-2 target nucleic acids are NOT DETECTED.  The SARS-CoV-2 RNA is generally detectable in upper respiratory specimens during the acute phase of infection. The lowest concentration of SARS-CoV-2 viral copies this assay can detect is 138 copies/mL. A negative result does not preclude SARS-Cov-2 infection and should not be used as the sole basis for treatment or other patient management decisions. A negative result may occur with  improper specimen collection/handling, submission of specimen other than nasopharyngeal swab, presence of viral mutation(s) within the areas targeted by this assay, and inadequate number of viral copies(<138 copies/mL). A negative result must be combined with clinical observations, patient history, and epidemiological information. The  expected result is Negative.  Fact Sheet for Patients:  EntrepreneurPulse.com.au  Fact Sheet for Healthcare Providers:  IncredibleEmployment.be  This test is no t yet approved or cleared by the [place] FDA and  has been authorized for detection and/or diagnosis of SARS-CoV-2 by FDA under an Emergency Use Authorization (EUA). This EUA will remain  in effect (meaning this test can be used) for the duration of the COVID-19 declaration under Section 564(b)(1) of the Act, 21 U.S.C.section 360bbb-3(b)(1), unless the authorization is terminated  or revoked sooner.       Influenza A by PCR NEGATIVE NEGATIVE Final   Influenza B by PCR NEGATIVE NEGATIVE Final    Comment: (NOTE) The Xpert Xpress SARS-CoV-2/FLU/RSV plus assay is intended as an aid in the diagnosis of influenza from Nasopharyngeal swab specimens and should not be used as a sole basis for treatment. Nasal washings and aspirates are unacceptable for Xpert Xpress SARS-CoV-2/FLU/RSV testing.  Fact Sheet for Patients: EntrepreneurPulse.com.au  Fact Sheet for  Healthcare Providers: IncredibleEmployment.be  This test is not yet approved or cleared by the [place] FDA and has been authorized for detection and/or diagnosis of SARS-CoV-2 by FDA under an Emergency Use Authorization (EUA). This EUA will remain in effect (meaning this test can be used) for the duration of the COVID-19 declaration under Section 564(b)(1) of the Act, 21 U.S.C. section 360bbb-3(b)(1), unless the authorization is terminated or revoked.  Performed at [facility], [address] [place], [place] [address]      Current Facility-Administered Medications:    0.9 %  sodium chloride infusion, , Intravenous, Continuous, Posey Pronto, Gretta Cool, MD   acetaminophen (TYLENOL) tablet 650 mg, 650 mg, Oral, Q6H PRN **OR** acetaminophen (TYLENOL) suppository 650 mg, 650 mg, Rectal, Q6H PRN, Para Skeans, MD   doxycycline (VIBRAMYCIN) 100 mg in sodium chloride 0.9 % 250 mL IVPB, 100 mg, Intravenous, Q12H, Posey Pronto, Gretta Cool, MD   folic acid injection 1 mg, 1 mg, Intravenous, Daily, Kataleah Bejar V, MD   lactated ringers bolus 500 mL, 500 mL, Intravenous, Once, Florina Ou V, MD   magnesium oxide (MAG-OX) tablet 400 mg, 400 mg, Oral, Daily PRN, Para Skeans, MD   morphine (PF) 2 MG/ML injection 1 mg, 1 mg, Intravenous, Q3H PRN, Posey Pronto, Gretta Cool, MD   nicotine (NICODERM CQ - dosed in mg/24 hours) patch 21 mg, 21 mg, Transdermal, Daily, Posey Pronto, Gretta Cool, MD   oxybutynin (DITROPAN-XL) 24 hr tablet 10 mg, 10 mg, Oral, QHS, Eryn Marandola V, MD   oxyCODONE (Oxy IR/ROXICODONE) immediate release tablet 5-10 mg, 5-10 mg, Oral, Q4H PRN, Para Skeans, MD   pantoprazole (PROTONIX) injection 40 mg, 40 mg, Intravenous, Q12H, Lyza Houseworth V, MD   pregabalin (LYRICA) capsule 300 mg, 300 mg, Oral, BID, Jennifermarie Franzen V, MD   rOPINIRole (REQUIP) tablet 1 mg, 1 mg, Oral, QHS, Belita Warsame V, MD   sodium chloride flush (NS) 0.9 % injection 3 mL, 3 mL, Intravenous, Q12H, Florina Ou V, MD, 3 mL at  02/27/22 0102   thiamine (VITAMIN B1) injection 100 mg, 100 mg, Intravenous, Daily, Para Skeans, MD  Current Outpatient Medications:    alendronate (FOSAMAX) 70 MG tablet, Take 70 mg by mouth every Saturday. Take with a full glass of water on an empty stomach. , Disp: , Rfl:    amLODipine (NORVASC) 10 MG tablet, Take 10 mg by mouth daily. , Disp: , Rfl:    atenolol (TENORMIN) 100 MG tablet, Take 100 mg by  mouth every other day. , Disp: , Rfl:    carisoprodol (SOMA) 350 MG tablet, Take 350 mg by mouth in the morning and at bedtime., Disp: , Rfl:    cholecalciferol (VITAMIN D3) 25 MCG (1000 UT) tablet, Take 1,000 Units by mouth every evening. , Disp: , Rfl:    fluticasone (FLONASE) 50 MCG/ACT nasal spray, Place 1 spray into both nostrils daily., Disp: , Rfl:    furosemide (LASIX) 20 MG tablet, Take 20 mg by mouth 2 (two) times daily. , Disp: , Rfl:    Glucosamine-Chondroitin (COSAMIN DS PO), Take 1 tablet by mouth 3 (three) times a week., Disp: , Rfl:    lisinopril-hydrochlorothiazide (PRINZIDE,ZESTORETIC) 20-25 MG per tablet, Take 1 tablet by mouth every other day. , Disp: , Rfl:    LYSINE ACETATE PO, Take 4,000 mg by mouth daily as needed (cold sores). , Disp: , Rfl:    magnesium oxide (MAG-OX) 400 MG tablet, Take 400 mg by mouth daily as needed (leg cramps)., Disp: , Rfl:    methocarbamol (ROBAXIN) 750 MG tablet, Take 750 mg by mouth as needed., Disp: , Rfl:    omeprazole (PRILOSEC) 20 MG capsule, Take 20 mg by mouth daily., Disp: , Rfl:    oxybutynin (DITROPAN-XL) 10 MG 24 hr tablet, Take 1 tablet (10 mg total) by mouth at bedtime., Disp: 30 tablet, Rfl: 11   oxyCODONE (OXY IR/ROXICODONE) 5 MG immediate release tablet, Take 1-2 tablets (5-10 mg total) by mouth every 4 (four) hours as needed for moderate pain., Disp: 60 tablet, Rfl: 0   potassium chloride (KLOR-CON) 10 MEQ tablet, Take 10 mEq by mouth in the morning and at bedtime., Disp: , Rfl:    predniSONE (DELTASONE) 10 MG tablet, Take 10  mg by mouth daily., Disp: , Rfl:    pregabalin (LYRICA) 300 MG capsule, Take 300 mg by mouth 2 (two) times daily.  , Disp: , Rfl:    rOPINIRole (REQUIP) 1 MG tablet, Take 1 mg by mouth at bedtime. , Disp: , Rfl:    traMADol (ULTRAM) 50 MG tablet, Take 1-2 tablets (50-100 mg total) by mouth every 6 (six) hours as needed for moderate pain., Disp: 40 tablet, Rfl: 0   triamcinolone (KENALOG) 0.025 % cream, APPLY 1 APPLICATION TOPICALLY DAILY AS NEEDED (RASH)., Disp: 30 g, Rfl: 0   zolpidem (AMBIEN) 10 MG tablet, Take 10 mg by mouth at bedtime., Disp: , Rfl:   COVID-19 Labs No results for input(s): "DDIMER", "FERRITIN", "LDH", "CRP" in the last 72 hours. Lab Results  Component Value Date   SARSCOV2NAA NEGATIVE 02/27/2022   [place] NEGATIVE 09/18/2019    Radiological Exams on Admission: DG Chest Portable 1 View  Result Date: 02/26/2022 CLINICAL DATA:  Shortness of breath.  Weakness. EXAM: PORTABLE CHEST 1 VIEW COMPARISON:  04/15/2020 FINDINGS: Upper normal heart size.The cardiomediastinal contours are normal. The lungs are clear. Pulmonary vasculature is normal. No consolidation, pleural effusion, or pneumothorax. No acute osseous abnormalities are seen. Her shoulder arthropathy. Surgical clips in the left axilla. IMPRESSION: No acute chest findings. Electronically Signed   By: Keith Rake M.D.   On: 02/26/2022 22:39   CT HEAD WO CONTRAST (5MM)  Result Date: 02/26/2022 CLINICAL DATA:  Mental status change, unknown cause, unwitnessed fall EXAM: CT HEAD WITHOUT CONTRAST TECHNIQUE: Contiguous axial images were obtained from the base of the skull through the vertex without intravenous contrast. RADIATION DOSE REDUCTION: This exam was performed according to the departmental dose-optimization program which includes automated exposure control, adjustment  of the mA and/or kV according to patient size and/or use of iterative reconstruction technique. COMPARISON:  None Available. FINDINGS: Brain: No  acute intracranial abnormality. Specifically, no hemorrhage, hydrocephalus, mass lesion, acute infarction, or significant intracranial injury. Vascular: No hyperdense vessel or unexpected calcification. Skull: No acute calvarial abnormality. Sinuses/Orbits: No acute findings Other: None IMPRESSION: No acute intracranial abnormality. Electronically Signed   By: Rolm Baptise M.D.   On: 02/26/2022 21:21   DG Knee Complete 4 Views Right  Result Date: 02/26/2022 CLINICAL DATA:  Fall, pain EXAM: RIGHT KNEE - COMPLETE 4+ VIEW COMPARISON:  08/12/2018 FINDINGS: Prior right knee replacement. No acute bony abnormality. Specifically, no fracture, subluxation, or dislocation. No hardware complicating feature. No joint effusion. Soft tissues are intact. IMPRESSION: No acute bony abnormality. Electronically Signed   By: Rolm Baptise M.D.   On: 02/26/2022 21:16   DG Knee Complete 4 Views Left  Result Date: 02/26/2022 CLINICAL DATA:  Weakness, fall EXAM: LEFT KNEE - COMPLETE 4+ VIEW COMPARISON:  09/22/2019 FINDINGS: Prior left knee replacement. No hardware complicating feature. Insert bones small joint effusion. IMPRESSION: No acute bony abnormality.  Small joint effusion. Electronically Signed   By: Rolm Baptise M.D.   On: 02/26/2022 21:15    EKG: Independently reviewed.  EKG today shows atrial fibrillation with a rapid ventricular rate of 105, question significant Q waves in V1 concerning for anterior infarct.  Assessment and Plan: * AMS (altered mental status) Patient found on the floor of her home soiled stool and stool around her. Suspect this is more of a syncopal episode where she may have become hypotensive possibly from A-fib RVR. Slightly suspicious for also CVA. Initial head CT negative so we will obtain an MRI of the brain noncontrast. Neurochecks. Patient has failed swallow eval.  Currently we will keep patient NPO.  Atrial fibrillation with RVR (HCC) Initial presentation of A-fib RVR with heart  rates in the 120s. Post hydration heart rate has improved somewhat and rate is about 88. Rhythm is atrial fibrillation irregular. Discussed with patient about anticoagulation. Patient understands the risk of anticoagulation. We are waiting on the MRI of the brain today to decide on plan for Eliquis therapy for A-fib. Patient's CHA2DS2-VASc score puts her at a moderate to high risk for CVA or VTE.  Acute kidney injury superimposed on chronic kidney disease ([place]) Lab Results  Component Value Date   CREATININE 2.84 (H) 02/26/2022   CREATININE 0.90 04/15/2020   CREATININE 1.21 (H) 09/24/2019  Will follow avoid contrast studies and avoid hypoperfusion.   Rhabdomyolysis Traumatic rhabdo from fall. Continue with IV fluid hydration.  Alcohol abuse Alcohol level is pending.  Lactic acidosis Patient has received aggressive IV fluid hydration and repeat lactic resolved.   Abnormal LFTs Ethanol level pending.    Latest Ref Rng & Units 02/26/2022    8:54 PM 04/15/2020    9:45 AM 09/24/2019    5:44 AM  CMP  Glucose 70 - 99 mg/dL 116   121   BUN 8 - 23 mg/dL 54   14   Creatinine 0.44 - 1.00 mg/dL 2.84  0.90  1.21   Sodium 135 - 145 mmol/L 126   132   Potassium 3.5 - 5.1 mmol/L 3.1   3.7   Chloride 98 - 111 mmol/L 87   95   CO2 22 - 32 mmol/L 26   29   Calcium 8.9 - 10.3 mg/dL 7.9   8.3   Total Protein 6.5 - 8.1 g/dL 6.7  Total Bilirubin 0.3 - 1.2 mg/dL 2.0     Alkaline Phos 38 - 126 U/L 67     AST 15 - 41 U/L 72     ALT 0 - 44 U/L 30     Elevated total bili could also be underlying sepsis.   Electrolyte abnormality Treat the hyponatremia hypokalemia replace and follow levels.  Hypertension Currently Home medications of amlodipine and atenolol and Lasix and Zestoretic are all held.  Gastroesophageal reflux disease We will continue patient on IV PPI therapy.  Next       DVT prophylaxis:  Heparin    Code Status:  Full code    Family Communication:  Tullo,Douglas H  (Spouse)  [phone] (Mobile)   Disposition Plan:  Home    Consults called:  None    Admission status: Inpatient.        Para Skeans MD [facility]  6 PM- 2 AM. Please contact me via secure Chat 6 PM-2 AM. [phone] ( Pager ) To contact the [facility] Attending or Consulting provider [place] or covering provider during after hours [place], for this patient.   Check the care team in [facility] and look for a) attending/consulting [facility] provider listed and b) the [facility] team listed Log into [email or web address] and use [facility]'s universal password to access. If you do not have the password, please contact the hospital operator. Locate the [facility] provider you are looking for under [facility] and page to a number that you can be directly reached. If you still have difficulty reaching the provider, please page the [facility] (Director on Call) for the Hospitalists listed on amion for assistance. [email or web address] 02/27/2022, 3:11 AM

## 2022-02-26 NOTE — ED Triage Notes (Signed)
First nurse note: pt to ER via ems from home with cc of weakness x2 days.  Pt also had fall yesterday and is c/o  some right knee pain. Pt has afib with hx of same (rate 100-120).  Unknown if pt hit head, or if pt takes blood thinners per ems.  Pt with temp of 100.1 per ems.

## 2022-02-27 ENCOUNTER — Encounter: Payer: Self-pay | Admitting: Internal Medicine

## 2022-02-27 ENCOUNTER — Inpatient Hospital Stay: Payer: Medicare HMO

## 2022-02-27 ENCOUNTER — Other Ambulatory Visit: Payer: Self-pay

## 2022-02-27 ENCOUNTER — Inpatient Hospital Stay
Admit: 2022-02-27 | Discharge: 2022-02-27 | Disposition: A | Discharge status: Home / Self Care | Payer: Medicare HMO | Attending: Internal Medicine | Admitting: Internal Medicine

## 2022-02-27 DIAGNOSIS — R7989 Other specified abnormal findings of blood chemistry: Secondary | ICD-10-CM | POA: Diagnosis present

## 2022-02-27 DIAGNOSIS — N179 Acute kidney failure, unspecified: Secondary | ICD-10-CM | POA: Diagnosis present

## 2022-02-27 DIAGNOSIS — H5461 Unqualified visual loss, right eye, normal vision left eye: Secondary | ICD-10-CM | POA: Diagnosis present

## 2022-02-27 DIAGNOSIS — I4891 Unspecified atrial fibrillation: Secondary | ICD-10-CM | POA: Diagnosis present

## 2022-02-27 DIAGNOSIS — F32A Depression, unspecified: Secondary | ICD-10-CM | POA: Diagnosis present

## 2022-02-27 DIAGNOSIS — M6282 Rhabdomyolysis: Secondary | ICD-10-CM | POA: Diagnosis present

## 2022-02-27 DIAGNOSIS — IMO0002 Reserved for concepts with insufficient information to code with codable children: Secondary | ICD-10-CM | POA: Insufficient documentation

## 2022-02-27 DIAGNOSIS — I480 Paroxysmal atrial fibrillation: Secondary | ICD-10-CM | POA: Diagnosis present

## 2022-02-27 DIAGNOSIS — R197 Diarrhea, unspecified: Secondary | ICD-10-CM | POA: Diagnosis present

## 2022-02-27 DIAGNOSIS — E871 Hypo-osmolality and hyponatremia: Secondary | ICD-10-CM | POA: Diagnosis present

## 2022-02-27 DIAGNOSIS — G2581 Restless legs syndrome: Secondary | ICD-10-CM | POA: Diagnosis present

## 2022-02-27 DIAGNOSIS — N390 Urinary tract infection, site not specified: Secondary | ICD-10-CM | POA: Diagnosis present

## 2022-02-27 DIAGNOSIS — E876 Hypokalemia: Secondary | ICD-10-CM | POA: Diagnosis present

## 2022-02-27 DIAGNOSIS — N189 Chronic kidney disease, unspecified: Secondary | ICD-10-CM

## 2022-02-27 DIAGNOSIS — Z20822 Contact with and (suspected) exposure to covid-19: Secondary | ICD-10-CM | POA: Diagnosis present

## 2022-02-27 DIAGNOSIS — F1010 Alcohol abuse, uncomplicated: Secondary | ICD-10-CM | POA: Diagnosis present

## 2022-02-27 DIAGNOSIS — I4892 Unspecified atrial flutter: Secondary | ICD-10-CM | POA: Diagnosis present

## 2022-02-27 DIAGNOSIS — R29818 Other symptoms and signs involving the nervous system: Secondary | ICD-10-CM | POA: Diagnosis not present

## 2022-02-27 DIAGNOSIS — N1832 Chronic kidney disease, stage 3b: Secondary | ICD-10-CM | POA: Diagnosis present

## 2022-02-27 DIAGNOSIS — E1122 Type 2 diabetes mellitus with diabetic chronic kidney disease: Secondary | ICD-10-CM | POA: Diagnosis present

## 2022-02-27 DIAGNOSIS — I959 Hypotension, unspecified: Secondary | ICD-10-CM | POA: Diagnosis present

## 2022-02-27 DIAGNOSIS — I129 Hypertensive chronic kidney disease with stage 1 through stage 4 chronic kidney disease, or unspecified chronic kidney disease: Secondary | ICD-10-CM | POA: Diagnosis present

## 2022-02-27 DIAGNOSIS — W19XXXA Unspecified fall, initial encounter: Secondary | ICD-10-CM | POA: Diagnosis present

## 2022-02-27 DIAGNOSIS — E785 Hyperlipidemia, unspecified: Secondary | ICD-10-CM | POA: Diagnosis present

## 2022-02-27 DIAGNOSIS — R4182 Altered mental status, unspecified: Secondary | ICD-10-CM | POA: Diagnosis present

## 2022-02-27 DIAGNOSIS — I7300 Raynaud's syndrome without gangrene: Secondary | ICD-10-CM | POA: Diagnosis present

## 2022-02-27 DIAGNOSIS — E878 Other disorders of electrolyte and fluid balance, not elsewhere classified: Secondary | ICD-10-CM | POA: Diagnosis present

## 2022-02-27 DIAGNOSIS — I6523 Occlusion and stenosis of bilateral carotid arteries: Secondary | ICD-10-CM | POA: Diagnosis not present

## 2022-02-27 DIAGNOSIS — K219 Gastro-esophageal reflux disease without esophagitis: Secondary | ICD-10-CM | POA: Diagnosis present

## 2022-02-27 DIAGNOSIS — E872 Acidosis, unspecified: Secondary | ICD-10-CM | POA: Diagnosis present

## 2022-02-27 DIAGNOSIS — G9341 Metabolic encephalopathy: Secondary | ICD-10-CM | POA: Diagnosis present

## 2022-02-27 DIAGNOSIS — E860 Dehydration: Secondary | ICD-10-CM | POA: Diagnosis present

## 2022-02-27 DIAGNOSIS — D696 Thrombocytopenia, unspecified: Secondary | ICD-10-CM | POA: Diagnosis present

## 2022-02-27 DIAGNOSIS — I10 Essential (primary) hypertension: Secondary | ICD-10-CM | POA: Diagnosis present

## 2022-02-27 DIAGNOSIS — E869 Volume depletion, unspecified: Secondary | ICD-10-CM | POA: Diagnosis present

## 2022-02-27 LAB — URINALYSIS, ROUTINE W REFLEX MICROSCOPIC
Bilirubin Urine: NEGATIVE
Glucose, UA: NEGATIVE mg/dL
Ketones, ur: NEGATIVE mg/dL
Nitrite: NEGATIVE
Protein, ur: NEGATIVE mg/dL
Specific Gravity, Urine: 1.012 (ref 1.005–1.030)
WBC, UA: >50 WBC/hpf — ABNORMAL HIGH (ref 0–5)
pH: 5 (ref 5.0–8.0)

## 2022-02-27 LAB — HIV ANTIBODY (ROUTINE TESTING W REFLEX): HIV Screen 4th Generation wRfx: NONREACTIVE

## 2022-02-27 LAB — COMPREHENSIVE METABOLIC PANEL
ALT: 30 U/L (ref 0–44)
AST: 76 U/L — ABNORMAL HIGH (ref 15–41)
Albumin: 3.2 g/dL — ABNORMAL LOW (ref 3.5–5.0)
Alkaline Phosphatase: 71 U/L (ref 38–126)
Anion gap: 13 (ref 5–15)
BUN: 53 mg/dL — ABNORMAL HIGH (ref 8–23)
CO2: 21 mmol/L — ABNORMAL LOW (ref 22–32)
Calcium: 7.7 mg/dL — ABNORMAL LOW (ref 8.9–10.3)
Chloride: 98 mmol/L (ref 98–111)
Creatinine, Ser: 2.32 mg/dL — ABNORMAL HIGH (ref 0.44–1.00)
GFR, Estimated: 22 mL/min — ABNORMAL LOW (ref >60)
Glucose, Bld: 83 mg/dL (ref 70–99)
Potassium: 3.3 mmol/L — ABNORMAL LOW (ref 3.5–5.1)
Sodium: 132 mmol/L — ABNORMAL LOW (ref 135–145)
Total Bilirubin: 1.6 mg/dL — ABNORMAL HIGH (ref 0.3–1.2)
Total Protein: 7.1 g/dL (ref 6.5–8.1)

## 2022-02-27 LAB — GASTROINTESTINAL PANEL BY PCR, STOOL (REPLACES STOOL CULTURE)

## 2022-02-27 LAB — CBC
HCT: 32 % — ABNORMAL LOW (ref 36.0–46.0)
Hemoglobin: 11.1 g/dL — ABNORMAL LOW (ref 12.0–15.0)
MCH: 34.9 pg — ABNORMAL HIGH (ref 26.0–34.0)
MCHC: 34.7 g/dL (ref 30.0–36.0)
MCV: 100.6 fL — ABNORMAL HIGH (ref 80.0–100.0)
Platelets: 88 10*3/uL — ABNORMAL LOW (ref 150–400)
RBC: 3.18 MIL/uL — ABNORMAL LOW (ref 3.87–5.11)
RDW: 14.1 % (ref 11.5–15.5)
WBC: 5.5 10*3/uL (ref 4.0–10.5)
nRBC: 0 % (ref 0.0–0.2)

## 2022-02-27 LAB — RESP PANEL BY RT-PCR (FLU A&B, COVID) ARPGX2
Influenza A by PCR: NEGATIVE
Influenza B by PCR: NEGATIVE
SARS Coronavirus 2 by RT PCR: NEGATIVE

## 2022-02-27 LAB — GLUCOSE, CAPILLARY: Glucose-Capillary: 75 mg/dL (ref 70–99)

## 2022-02-27 LAB — CK: Total CK: 457 U/L — ABNORMAL HIGH (ref 38–234)

## 2022-02-27 LAB — LIPASE, BLOOD: Lipase: 31 U/L (ref 11–51)

## 2022-02-27 LAB — TYPE AND SCREEN
ABO/RH(D): A POS
Antibody Screen: NEGATIVE

## 2022-02-27 LAB — MRSA NEXT GEN BY PCR, NASAL: MRSA by PCR Next Gen: NOT DETECTED

## 2022-02-27 LAB — ETHANOL: Alcohol, Ethyl (B): <10 mg/dL (ref <10)

## 2022-02-27 MED ORDER — ACETAMINOPHEN 325 MG PO TABS
650.0000 mg | ORAL_TABLET | Freq: Four times a day (QID) | ORAL | Status: DC | PRN
  Administered 2022-02-27 – 2022-03-01 (×4): 650 mg via ORAL
  Filled 2022-02-27 (×4): qty 2

## 2022-02-27 MED ORDER — SODIUM CHLORIDE 0.9 % IV SOLN: INTRAVENOUS | Status: DC

## 2022-02-27 MED ORDER — PANTOPRAZOLE SODIUM 40 MG IV SOLR
40.0000 mg | Freq: Two times a day (BID) | INTRAVENOUS | Status: DC
  Administered 2022-02-27 (×3): 40 mg via INTRAVENOUS
  Filled 2022-02-27 (×3): qty 10

## 2022-02-27 MED ORDER — POTASSIUM CHLORIDE CRYS ER 20 MEQ PO TBCR
20.0000 meq | EXTENDED_RELEASE_TABLET | Freq: Once | ORAL | Status: AC
  Administered 2022-02-27: 20 meq via ORAL
  Filled 2022-02-27: qty 1

## 2022-02-27 MED ORDER — APIXABAN 5 MG PO TABS
5.0000 mg | ORAL_TABLET | Freq: Two times a day (BID) | ORAL | Status: DC
  Administered 2022-02-27 – 2022-03-02 (×6): 5 mg via ORAL
  Filled 2022-02-27 (×6): qty 1

## 2022-02-27 MED ORDER — ACETAMINOPHEN 650 MG RE SUPP
650.0000 mg | Freq: Four times a day (QID) | RECTAL | Status: DC | PRN
Start: 2022-02-27 — End: 2022-03-02

## 2022-02-27 MED ORDER — ROPINIROLE HCL 1 MG PO TABS
1.0000 mg | ORAL_TABLET | Freq: Every day | ORAL | Status: DC
  Administered 2022-02-27 – 2022-03-01 (×3): 1 mg via ORAL
  Filled 2022-02-27 (×3): qty 1

## 2022-02-27 MED ORDER — SODIUM CHLORIDE 0.9 % IV BOLUS
1000.0000 mL | Freq: Once | INTRAVENOUS | Status: AC
  Administered 2022-02-27: 1000 mL via INTRAVENOUS

## 2022-02-27 MED ORDER — ORAL CARE MOUTH RINSE: 15.0000 mL | OROMUCOSAL | Status: DC | PRN

## 2022-02-27 MED ORDER — LACTATED RINGERS IV BOLUS
1000.0000 mL | Freq: Once | INTRAVENOUS | Status: AC
  Administered 2022-02-27: 1000 mL via INTRAVENOUS

## 2022-02-27 MED ORDER — OXYCODONE HCL 5 MG PO TABS
5.0000 mg | ORAL_TABLET | ORAL | Status: DC | PRN
  Administered 2022-02-27: 5 mg via ORAL
  Administered 2022-02-27: 10 mg via ORAL
  Administered 2022-02-28 – 2022-03-01 (×3): 5 mg via ORAL
  Filled 2022-02-27 (×3): qty 1
  Filled 2022-02-27: qty 2
  Filled 2022-02-27: qty 1

## 2022-02-27 MED ORDER — DILTIAZEM HCL 30 MG PO TABS
30.0000 mg | ORAL_TABLET | Freq: Four times a day (QID) | ORAL | Status: DC
  Administered 2022-02-27 – 2022-03-01 (×7): 30 mg via ORAL
  Filled 2022-02-27 (×7): qty 1

## 2022-02-27 MED ORDER — MORPHINE SULFATE (PF) 2 MG/ML IV SOLN: 1.0000 mg | INTRAVENOUS | Status: DC | PRN

## 2022-02-27 MED ORDER — PREGABALIN 75 MG PO CAPS
150.0000 mg | ORAL_CAPSULE | Freq: Every day | ORAL | Status: DC
  Administered 2022-02-27 – 2022-03-02 (×4): 150 mg via ORAL
  Filled 2022-02-27 (×4): qty 2

## 2022-02-27 MED ORDER — METRONIDAZOLE 500 MG/100ML IV SOLN
500.0000 mg | Freq: Two times a day (BID) | INTRAVENOUS | Status: DC
  Administered 2022-02-27: 500 mg via INTRAVENOUS
  Filled 2022-02-27: qty 100

## 2022-02-27 MED ORDER — SODIUM CHLORIDE 0.9 % IV SOLN
1.0000 g | Freq: Three times a day (TID) | INTRAVENOUS | Status: DC
  Administered 2022-02-27: 1 g via INTRAVENOUS
  Filled 2022-02-27 (×3): qty 5

## 2022-02-27 MED ORDER — NICOTINE 21 MG/24HR TD PT24
21.0000 mg | MEDICATED_PATCH | Freq: Every day | TRANSDERMAL | Status: DC
  Administered 2022-02-27: 21 mg via TRANSDERMAL
  Filled 2022-02-27 (×4): qty 1

## 2022-02-27 MED ORDER — PREGABALIN 75 MG PO CAPS
300.0000 mg | ORAL_CAPSULE | Freq: Two times a day (BID) | ORAL | Status: DC
Start: 2022-02-27 — End: 2022-02-27

## 2022-02-27 MED ORDER — OXYBUTYNIN CHLORIDE ER 10 MG PO TB24
10.0000 mg | ORAL_TABLET | Freq: Every day | ORAL | Status: DC
Start: 2022-02-27 — End: 2022-03-02
  Administered 2022-02-27 – 2022-03-01 (×3): 10 mg via ORAL
  Filled 2022-02-27 (×3): qty 1

## 2022-02-27 MED ORDER — HEPARIN SODIUM (PORCINE) 5000 UNIT/ML IJ SOLN: 5000.0000 [IU] | Freq: Two times a day (BID) | INTRAMUSCULAR | Status: DC

## 2022-02-27 MED ORDER — THIAMINE HCL 100 MG/ML IJ SOLN
100.0000 mg | Freq: Every day | INTRAMUSCULAR | Status: DC
  Administered 2022-02-27: 100 mg via INTRAVENOUS
  Filled 2022-02-27: qty 2

## 2022-02-27 MED ORDER — SODIUM CHLORIDE 0.9 % IV SOLN
100.0000 mg | Freq: Two times a day (BID) | INTRAVENOUS | Status: DC
  Administered 2022-02-27: 100 mg via INTRAVENOUS
  Filled 2022-02-27 (×2): qty 100

## 2022-02-27 MED ORDER — SODIUM CHLORIDE 0.9 % IV SOLN
1.0000 g | INTRAVENOUS | Status: DC
  Administered 2022-02-27 – 2022-02-28 (×2): 1 g via INTRAVENOUS
  Filled 2022-02-27 (×3): qty 10

## 2022-02-27 MED ORDER — MAGNESIUM OXIDE 400 MG PO TABS: 400.0000 mg | ORAL_TABLET | Freq: Every day | ORAL | Status: DC | PRN

## 2022-02-27 MED ORDER — CHLORHEXIDINE GLUCONATE CLOTH 2 % EX PADS
6.0000 | MEDICATED_PAD | Freq: Every day | CUTANEOUS | Status: DC
Start: 2022-02-28 — End: 2022-03-02
  Administered 2022-02-28: 6 via TOPICAL

## 2022-02-27 MED ORDER — SODIUM CHLORIDE 0.9% FLUSH
3.0000 mL | Freq: Two times a day (BID) | INTRAVENOUS | Status: DC
  Administered 2022-02-27 – 2022-03-01 (×6): 3 mL via INTRAVENOUS

## 2022-02-27 MED ORDER — LACTATED RINGERS IV BOLUS
500.0000 mL | Freq: Once | INTRAVENOUS | Status: DC
Start: 2022-02-27 — End: 2022-02-28

## 2022-02-27 NOTE — Progress Notes (Signed)
*  PRELIMINARY RESULTS* Echocardiogram 2D Echocardiogram has been performed.  Krystal [name] 02/27/2022, 2:51 PM

## 2022-02-27 NOTE — Assessment & Plan Note (Addendum)
Secondary to sepsis and dehydration.  MRI negative for CVA.  Currently she is alert and oriented x3.  According to husband, she appears to be close to 90% to baseline.  Patient has no memory of what happened.  She states on Saturday 8/5 morning, she was in a rush to get to the [employer] and had forgotten to take her morning medications, but had plan to after she got home.  She does not remember much after that until she woke up today.

## 2022-02-27 NOTE — Assessment & Plan Note (Addendum)
Patient with some shakiness, history of daily drinking. -Placed her on CIWA protocol

## 2022-02-27 NOTE — Assessment & Plan Note (Addendum)
Change PPI to p.o. after she passes swallow evaluation

## 2022-02-27 NOTE — Progress Notes (Signed)
[facility] Progress Note  Patient: Krystal [name]    KVQ:[id number]  DOA: 02/26/2022    Date of Service: the patient was seen and examined on 02/27/2022  Brief hospital course: 71 year old female with past medical history of morbid obesity, hypertension and sleep apnea and atrial fibrillation brought in the emergency room on 8/7 after being found down by her husband.  Patient's husband had been away on a trip for several days and upon return, found patient on floor with diarrhea found in various places around the house.  Patient had last spoke to his wife on the morning of 8/5 and at that time, was okay.  In the emergency room, patient found to be confused as well as in atrial fibrillation with rapid ventricular rate and lab work noteworthy for acute kidney injury, rhabdomyolysis and possibly sepsis from urinary tract infection.  Patient was admitted to the hospitalist service and started on IV fluids and antibiotics for sepsis secondary to urinary tract infection.  By following day, patient much more awake and alert.  Blood pressures and renal function improved.  Urine culture and urine drug screen pending.  Stool cultures negative.  Assessment and Plan: Assessment and Plan: AMS (altered mental status)-resolved as of 02/27/2022 Secondary to sepsis and dehydration.  MRI negative for CVA.  Currently she is alert and oriented x3.  According to husband, she appears to be close to 90% to baseline.  Patient has no memory of what happened.  She states on Saturday 8/5 morning, she was in a rush to get to the [employer] and had forgotten to take her morning medications, but had plan to after she got home.  She does not remember much after that until she woke up today.  Atrial fibrillation with RVR (HCC) Initial presentation of A-fib RVR with heart rates in the 120s. Post hydration heart rate has improved somewhat and rate is about 88.  Still having episodes of heart rate above 100 with borderline  blood pressures.  Will increase fluids and start p.o. Cardizem.  Start Eliquis.  Patient's CHA2DS2-VASc score puts her at a moderate to high risk for CVA or VTE.  Diarrhea Unclear etiology.  Possible transient viral gastroenteritis?  Stool cultures here negative.  Patient with no further diarrhea.  Tolerating p.o. although checking swallow evaluation.  It is possible that between her UTI and diarrhea, she became quite volume depleted and hypotensive, that caused her to go into rapid atrial fibrillation.  Urinary tract infection Given that patient does not meet SIRS criteria and tachycardia and tachypnea could be secondary to atrial fibrillation and volume depletion, sepsis ruled out.  Continue IV antibiotics.  Urine and blood cultures pending.  Acute kidney injury superimposed on Stage 3B chronic kidney disease ([place]) Acute kidney injury secondary to diarrhea and sepsis.  Continue aggressive fluid resuscitation.  Electrolyte abnormality Hydrating for hyponatremia and replacing supplemental potassium.  Alcohol abuse Alcohol level is pending.  Normal alcohol level, but not sent until 8/8.  Monitor for withdrawals  Abnormal LFTs Alcohol level normal, however it was only checked today.  Urine drug screen pending.  Suspect however this could be more due to shock liver.   Hypertension Held blood pressure medications secondary to softer blood pressures.  Have started Cardizem for her atrial fibrillation  Rhabdomyolysis-resolved as of 02/27/2022 Traumatic rhabdo from fall.  Mild Continue with IV fluid hydration.  Lactic acidosis Secondary to intravascular volume depletion from diarrhea.  No evidence of sepsis, sepsis ruled out.  Patient has received  aggressive IV fluid hydration and repeat lactic resolved.   Gastroesophageal reflux disease Change PPI to p.o. after she passes swallow evaluation  Morbid obesity ([place]) Meets criteria with BMI greater than 35 and comorbidity of  hypertension       Body mass index is 37.1 kg/m.        Consultants: Critical care  Procedures: Echocardiogram with results pending  Antimicrobials: IV Azactam 8/7 - 8/8 IV Flagyl 8/8 only IV Rocephin 8/8-present IV doxycycline 8/7 - 8/8  Code Status: Full code   Subjective: Patient complains of being very tired, denies any pain, not much memory of what happened before  Objective: Noted softer blood pressures and borderline tachycardia Vitals:   02/27/22 1600 02/27/22 1700  BP: (!) 91/51 93/65  Pulse: 94 (!) 102  Resp: (!) 34 (!) 30  Temp: 98.9 F (37.2 C)   SpO2: 95% 98%    Intake/Output Summary (Last 24 hours) at 02/27/2022 2018 Last data filed at 02/27/2022 1800 Gross per 24 hour  Intake 579.05 ml  Output 1525 ml  Net -945.95 ml   Filed Weights   02/26/22 2037  Weight: 95 kg   Body mass index is 37.1 kg/m.  Exam:  General: Alert and oriented x3, fatigued HEENT: Normocephalic, atraumatic, mucous membranes are dry Cardiovascular: Irregular rhythm, borderline bradycardia Respiratory: Clear to auscultation bilateral Abdomen: Soft, obese, nontender, positive bowel sounds Musculoskeletal: No clubbing or cyanosis, trace pitting edema Skin: No skin breaks, tears or lesions Psychiatry: Appropriate, no evidence of psychoses Neurology: No focal deficits  Data Reviewed: Hemoglobin 11.1 with MCV of 100.  Creatinine down to 2.32 with potassium of 3.3 and albumin of 3.2 with AST of 76.  Disposition:  Status is: Inpatient Remains inpatient appropriate because: Further stabilization, identification of UTI    Anticipated discharge date: 8/11 Transfer to floor.  Speech therapy and physical therapy to see.  Family Communication: Husband at the bedside DVT Prophylaxis:  apixaban Arne Cleveland) tablet 5 mg    Author: Annita Brod ,MD 02/27/2022 8:18 PM  To reach On-call, see care teams to locate the attending and reach out via [email or web address]. Between  7PM-7AM, please contact night-coverage If you still have difficulty reaching the attending provider, please page the [facility] (Director on Call) for [facility] on amion for assistance.

## 2022-02-27 NOTE — Hospital Course (Addendum)
71 year old female with past medical history of morbid obesity, hypertension and sleep apnea and atrial fibrillation brought in the emergency room on 8/7 after being found down by her husband.  Patient's husband had been away on a trip for several days and upon return, found patient on floor with diarrhea found in various places around the house.  Patient had last spoke to his wife on the morning of 8/5 and at that time, was okay.  In the emergency room, patient found to be confused as well as in atrial fibrillation with rapid ventricular rate and lab work noteworthy for acute kidney injury, rhabdomyolysis and possibly sepsis from urinary tract infection.  Patient was admitted to the hospitalist service and started on IV fluids and antibiotics for sepsis secondary to urinary tract infection.  By following day, patient much more awake and alert.  Blood pressures and renal function improved.  Urine culture and urine drug screen pending.  Stool cultures negative.  8/9: Patient seems improving, heart rate intermittently goes in low 100s.  Urine culture with E. Coli.  Complaining of some shakiness with history of daily alcohol use.  Placed on CIWA.  8/10: Heart rate remained mostly elevated, received metoprolol overnight.  Cardiology was consulted.  Concern of atrial flutter with variable conduction. Urine culture with pansensitive E. Coli.  Antibiotics switched with Keflex. Patient also uses CPAP at home which was reordered.  8/11: Heart rate started improving.  Cardizem dose was increased to 60 mg every 6 hourly followed by starting long-acting at 240 mg daily from tomorrow. Magnesium remain low which is being repleted.  Patient is medically stable for discharge and was told to hold home amlodipine, atenolol and a combination pill of lisinopril and HCTZ until she sees her cardiologist and they can restart as appropriate.  She will continue the rest of her home medications and follow-up with her  providers.

## 2022-02-27 NOTE — Assessment & Plan Note (Signed)
Unclear etiology.  Possible transient viral gastroenteritis?  Stool cultures here negative.  Patient with no further diarrhea.  Tolerating p.o. although checking swallow evaluation.  It is possible that between her UTI and diarrhea, she became quite volume depleted and hypotensive, that caused her to go into rapid atrial fibrillation.

## 2022-02-27 NOTE — Assessment & Plan Note (Addendum)
Given that patient does not meet SIRS criteria and tachycardia and tachypnea could be secondary to atrial fibrillation and volume depletion, sepsis ruled out.  Urine cultures growing E. coli-pending susceptibility. -Continue with ceftriaxone

## 2022-02-27 NOTE — Evaluation (Signed)
Clinical/Bedside Swallow Evaluation Patient Details  Name: Krystal [name] MRN: [id number] Date of Birth: [birth year]  Today's Date: 02/27/2022 Time: SLP Start Time (ACUTE ONLY): 0940 SLP Stop Time (ACUTE ONLY): 4010 SLP Time Calculation (min) (ACUTE ONLY): 60 min  Past Medical History:  Past Medical History:  Diagnosis Date   Anemia    Arthritis    osteo   Blindness of right eye at birth   Cancer [facility]) 2011   Left breast 2011 and cervical age 53   Carpal tunnel syndrome    Cataract    Depression    Edema 07/27/2011   Fibromyalgia    muscle weakness and pain   GERD (gastroesophageal reflux disease)    Hyperlipidemia    Hypertension    benign   Lymphedema    Osteoporosis    Plantar fasciitis    Pre-diabetes    Raynaud's Disease    Restless leg syndrome    Rosacea    Rotator cuff rupture    Sleep apnea    Synovitis    Ulcer    Past Surgical History:  Past Surgical History:  Procedure Laterality Date   ABDOMINAL HYSTERECTOMY  1986   For bleeding and pain   BLADDER SURGERY  2006   Bladder tack   BREAST RECONSTRUCTION Bilateral 09/17/2012   Procedure: BREAST RECONSTRUCTION;  Surgeon: Theodoro Kos, DO;  Location: [place];  Service: Plastics;  Laterality: Bilateral;  BILATERAL BREAST RECONSTRUCTION WITH TISSUE EXPANDERS AND ALLOMED   BREAST RECONSTRUCTION Right 06/03/2013   Procedure: RIGHT BREAST CAPSULE CONSTRACTURE;  Surgeon: Theodoro Kos, DO;  Location: [place];  Service: Plastics;  Laterality: Right;   CARPAL TUNNEL RELEASE Bilateral 2008   EYE SURGERY Right    INJECTION KNEE Left 08/12/2018   Procedure: KNEE INJECTION-LEFT;  Surgeon: Corky Mull, MD;  Location: [facility];  Service: Orthopedics;  Laterality: Left;   JOINT REPLACEMENT     LIPOSUCTION Bilateral 01/29/2013   Procedure: LIPOSUCTION;  Surgeon: Theodoro Kos, DO;  Location: [place];  Service: Plastics;  Laterality: Bilateral;   LIPOSUCTION Right  06/03/2013   Procedure: LIPOSUCTION;  Surgeon: Theodoro Kos, DO;  Location: [place];  Service: Plastics;  Laterality: Right;   MASTECTOMY  2012   rt prophalactic mast-snbx   MASTECTOMY MODIFIED RADICAL  2012   left-axillary nodes   NEUROMA SURGERY Bilateral    NOSE SURGERY  2007   PORT-A-CATH REMOVAL     insertion and   THROAT SURGERY     TONSILLECTOMY     TOTAL KNEE ARTHROPLASTY Right 08/12/2018   Procedure: TOTAL KNEE ARTHROPLASTY-RIGHT;  Surgeon: Corky Mull, MD;  Location: [facility];  Service: Orthopedics;  Laterality: Right;   TOTAL KNEE ARTHROPLASTY Left 09/22/2019   Procedure: TOTAL KNEE ARTHROPLASTY;  Surgeon: Corky Mull, MD;  Location: [facility];  Service: Orthopedics;  Laterality: Left;   UVULOPALATOPHARYNGOPLASTY     HPI:  Pt is a 71 y.o. female with past medical history including hypertension hyperlipidemia, lymphedema, Raynaud's disease, GERD, Alcohol use disorder, obesity presents with altered mental status.  Patient is accompanied by her husband who says that he was away for the last 4 days.  She has not been feeling well for the last 3 days.  Has had copious amounts of diarrhea.  Has not been able to make it to the bathroom on multiple occasions so when he got home today apparently there was stool all over the house.  She denies urinary symptoms  denies abdominal pain or blood in her stool.  Denies respiratory symptoms.  MRI: No acute intracranial abnormality.  CXR: No acute chest findings.    Assessment / Plan / Recommendation  Clinical Impression   Pt seen today for BSE. She was awake, verbal and eager to have something to eat/drink. Min distracted but followed through w/ tasks w/ light verbal cues. Pt appears to present w/ adequate oropharyngeal phase swallow w/ No oropharyngeal phase dysphagia noted, No neuromuscular deficits noted. Pt consumed po trials w/ No overt, clinical s/s of aspiration during po trials. Pt appears at reduced risk for aspiration  following general aspiration precautions.  However, pt does have a Baseline h/o GERD and Raynaud's Disease, which can impact Esophageal motility.   During po trials, pt consumed all consistencies w/ No overt coughing, decline in vocal quality, or change in respiratory presentation during/post trials. Oral phase appeared [facility] w/ timely bolus management, mastication, and control of bolus propulsion for A-P transfer for swallowing. Oral clearing achieved w/ all trial consistencies. OM Exam appeared [facility] w/ no unilateral weakness noted. Speech Clear. Pt fed self w/ setup support; oral care w/ setup support.   Recommend a Regular consistency diet w/ well-Cut meats, moistened foods; Thin liquids. Recommend general aspiration precautions, Pills WHOLE in Puree IF needed for easier swallowing. REFLUX precautions. Setup support. Education given to pt on Pills in Puree; food consistencies and easy to eat options; general aspiration and REFLUX precautions. NSG to reconsult if any new needs arise. NSG agreed. SLP Visit Diagnosis: Dysphagia, unspecified (R13.10) (REFLUX precautions)    Aspiration Risk  No limitations    Diet Recommendation   a Regular consistency diet w/ well-Cut meats, moistened foods; Thin liquids. Recommend general aspiration and REFLUX precautions. Setup support.   Medication Administration: Whole meds with puree (IF needed for ease of swallowing)    Other  Recommendations Recommended Consults:  (Dietician f/u) Oral Care Recommendations: Oral care BID;Oral care before and after PO;Patient independent with oral care (setup) Other Recommendations:  (n/a)    Recommendations for follow up therapy are one component of a multi-disciplinary discharge planning process, led by the attending physician.  Recommendations may be updated based on patient status, additional functional criteria and insurance authorization.  Follow up Recommendations No SLP follow up      Assistance Recommended at  Discharge None  Functional Status Assessment Patient has not had a recent decline in their functional status  Frequency and Duration  (n/a)   (n/a)       Prognosis Prognosis for Safe Diet Advancement: Good Barriers to Reach Goals: Time post onset;Severity of deficits Barriers/Prognosis Comment: possible REFLUX precautions indicated      Swallow Study   General Date of Onset: 02/26/22 HPI: Pt is a 71 y.o. female with past medical history including hypertension hyperlipidemia, lymphedema, Raynaud's disease, GERD, Alcohol use disorder, obesity presents with altered mental status.  Patient is accompanied by her husband who says that he was away for the last 4 days.  She has not been feeling well for the last 3 days.  Has had copious amounts of diarrhea.  Has not been able to make it to the bathroom on multiple occasions so when he got home today apparently there was stool all over the house.  She denies urinary symptoms denies abdominal pain or blood in her stool.  Denies respiratory symptoms.  MRI: No acute intracranial abnormality.  CXR: No acute chest findings. Type of Study: Bedside Swallow Evaluation Previous Swallow Assessment: none Diet  Prior to this Study: NPO Temperature Spikes Noted: No (wbc 5.5) Respiratory Status: Nasal cannula (2L) History of Recent Intubation: No Behavior/Cognition: Alert;Cooperative;Pleasant mood;Requires cueing (min) Oral Cavity Assessment: Within Functional Limits Oral Care Completed by SLP: Yes Oral Cavity - Dentition: Adequate natural dentition Vision: Functional for self-feeding Self-Feeding Abilities: Able to feed self;Needs set up;Needs assist Patient Positioning: Upright in bed (needed support) Baseline Vocal Quality: Normal Volitional Cough: Strong Volitional Swallow: Able to elicit    Oral/Motor/Sensory Function Overall Oral Motor/Sensory Function: Within functional limits   Ice Chips Ice chips: Within functional limits Presentation: Spoon  (fed; 2 trials)   Thin Liquid Thin Liquid: Within functional limits Presentation: Self Fed;Cup;Straw (~8 ozs total) Other Comments: water, soda    Nectar Thick Nectar Thick Liquid: Not tested   Honey Thick Honey Thick Liquid: Not tested   Puree Puree: Within functional limits Presentation: Self Fed;Spoon (8-9 trials)   Solid     Solid: Within functional limits Presentation: Self Fed;Spoon (10 trials) Other Comments: then omelette and potatoes at the breakfast meal later on         Orinda Kenner, [place], [place]; [place] [phone] (ascom) Kaori Jumper 02/27/2022,12:17 PM

## 2022-02-27 NOTE — Progress Notes (Addendum)
70 y.o female with PMH of HTN, HLD, GERD, EtOH abuse admitted to hospitalist service with altered mental status found to meet sepsis criteria, AKI and mild acute rhabdo. Patient given 30 cc/kg of fluids and started on broad-spectrum antibiotics for suspected sepsis of unknown source. Patient remained hypotensive despite IVF boluses therefore PCCM consulted for possible pressor requirement. On assessment, pt alert and oriented x 3, hemodynamically stable with borderline BP MAP>65 and maintaining her airway. She had minimal urine output despite adequate fluid resuscitation therefore additional bolus ordered with improvement in her BP.  Sepsis of unknown source, UTI?  Meets SIRS Criteria  Initial interventions/workup included: 3 L of NS & aztreonam -Supplemental oxygen as needed, to maintain SpO2 > 90% -Check Urinalysis -F/u cultures, trend lactic/ PCT -Monitor WBC/ fever curve -IVF hydration as needed -Pressors for MAP goal >65 -Continue empiric abx pending cultures & sensitivities   AKI likely ATN in the setting of above, Acute Rhabdomyolysis -Monitor I&O's / urinary output -Follow BMP -Ensure adequate renal perfusion -Avoid nephrotoxic agents as able -Replace electrolytes as indicated   Acute Metabolic Encephalopathy  Hx of EtOH abuse high risk for withdrawals -check ethanol level -CIWA protocol if appropriate Thiamine, folate -Provide supportive care -CT Head and MRI Brain negative for acute intracranial abnormality    No pressor requirement at this time or any critical care needs. PCCM will sign off. Please re-consult if any critical care needs arises.    Rufina Falco, DNP, CCRN, FNP-C, AGACNP-BC Acute Care & Family Nurse Practitioner  [facility]  See Amion for personal pager PCCM on call pager [phone] until 7 am

## 2022-02-27 NOTE — Assessment & Plan Note (Addendum)
Hyponatremia improving with IV hydration, potassium at 3.4 today. -Continue with IV fluid -Replete potassium and monitor

## 2022-02-27 NOTE — Plan of Care (Signed)
  Problem: Education: Goal: Knowledge of General Education information will improve Description: Including pain rating scale, medication(s)/side effects and non-pharmacologic comfort measures Outcome: Not Progressing   Problem: Health Behavior/Discharge Planning: Goal: Ability to manage health-related needs will improve Outcome: Progressing   Problem: Clinical Measurements: Goal: Ability to maintain clinical measurements within normal limits will improve Outcome: Progressing   Problem: Nutrition: Goal: Adequate nutrition will be maintained Outcome: Adequate for Discharge

## 2022-02-27 NOTE — Progress Notes (Signed)
Pharmacy Antibiotic Note  Krystal [name] is a 71 y.o. female admitted on 02/26/2022 with sepsis.  Pharmacy has been consulted for Aztreonam dosing.  Plan:  Aztreonam 1 gm q8h per indication & renal fxn  Pharmacy will continue to follow and will adjust abx dosing whenever warranted.  Height: '5\' 3"'$  (160 cm) Weight: 95 kg (209 lb 7 oz) IBW/kg (Calculated) : 52.4  Temp (24hrs), Avg:99.8 F (37.7 C), Min:99.4 F (37.4 C), Max:100.1 F (37.8 C)   Recent Labs  Lab 02/26/22 2054 02/26/22 2234 02/27/22 0106  WBC 7.6  --  5.5  CREATININE 2.84*  --   --   LATICACIDVEN 2.2* 1.4  --     Estimated Creatinine Clearance: 20.8 mL/min (A) (by C-G formula based on SCr of 2.84 mg/dL (H)).    Allergies  Allergen Reactions   Cephalexin Nausea Only    Diaphoresis / Sweating (intolerance)   Levaquin [Levofloxacin] Other (See Comments)    Stiff joints, unable to walk.    Antimicrobials this admission: 8/08 Doxycycline >>  8/08 Aztreonam >>   Microbiology results: 8/07 BCx: Pending 8/07 UCx: Pending   Thank you for allowing pharmacy to be a part of this patient's care.  Renda Rolls, PharmD, [facility] 02/27/2022 3:13 AM

## 2022-02-27 NOTE — Assessment & Plan Note (Addendum)
Held blood pressure medications secondary to softer blood pressures.  Have started Cardizem for her atrial fibrillation

## 2022-02-27 NOTE — Assessment & Plan Note (Addendum)
Acute kidney injury secondary to diarrhea and sepsis.  Continue aggressive fluid resuscitation.

## 2022-02-27 NOTE — Assessment & Plan Note (Addendum)
Alcohol level normal, however it was only checked today.  Urine drug screen pending.  Suspect however this could be more due to shock liver.

## 2022-02-27 NOTE — Assessment & Plan Note (Signed)
Meets criteria with BMI greater than 35 and comorbidity of hypertension 

## 2022-02-27 NOTE — Assessment & Plan Note (Addendum)
Traumatic rhabdo from fall.  Mild Continue with IV fluid hydration.

## 2022-02-27 NOTE — Assessment & Plan Note (Addendum)
Initial presentation of A-fib RVR with heart rates in the 120s. Post hydration heart rate has improved somewhat and rate is about 88.  Still having episodes of heart rate above 100 with borderline blood pressures.  Will increase fluids and start p.o. Cardizem.  Start Eliquis.  Patient's CHA2DS2-VASc score puts her at a moderate to high risk for CVA or VTE.

## 2022-02-27 NOTE — Assessment & Plan Note (Addendum)
Secondary to intravascular volume depletion from diarrhea.  No evidence of sepsis, sepsis ruled out.  Patient has received aggressive IV fluid hydration and repeat lactic resolved.

## 2022-02-28 DIAGNOSIS — I4891 Unspecified atrial fibrillation: Secondary | ICD-10-CM | POA: Diagnosis not present

## 2022-02-28 LAB — URINE DRUG SCREEN, QUALITATIVE (ARMC ONLY)
Amphetamines, Ur Screen: NOT DETECTED
Barbiturates, Ur Screen: NOT DETECTED
Benzodiazepine, Ur Scrn: NOT DETECTED
Cannabinoid 50 Ng, Ur ~~LOC~~: NOT DETECTED
Cocaine Metabolite,Ur ~~LOC~~: NOT DETECTED
MDMA (Ecstasy)Ur Screen: NOT DETECTED
Methadone Scn, Ur: NOT DETECTED
Opiate, Ur Screen: NOT DETECTED
Phencyclidine (PCP) Ur S: NOT DETECTED
Tricyclic, Ur Screen: NOT DETECTED

## 2022-02-28 LAB — BASIC METABOLIC PANEL
Anion gap: 7 (ref 5–15)
BUN: 39 mg/dL — ABNORMAL HIGH (ref 8–23)
CO2: 24 mmol/L (ref 22–32)
Calcium: 7.7 mg/dL — ABNORMAL LOW (ref 8.9–10.3)
Chloride: 101 mmol/L (ref 98–111)
Creatinine, Ser: 1.85 mg/dL — ABNORMAL HIGH (ref 0.44–1.00)
GFR, Estimated: 29 mL/min — ABNORMAL LOW (ref >60)
Glucose, Bld: 121 mg/dL — ABNORMAL HIGH (ref 70–99)
Potassium: 3.4 mmol/L — ABNORMAL LOW (ref 3.5–5.1)
Sodium: 132 mmol/L — ABNORMAL LOW (ref 135–145)

## 2022-02-28 LAB — ECHOCARDIOGRAM COMPLETE BUBBLE STUDY
AR max vel: 3.01 cm2
AV Area VTI: 3.19 cm2
AV Area mean vel: 2.79 cm2
AV Mean grad: 1.5 mmHg
AV Peak grad: 2.5 mmHg
Ao pk vel: 0.79 m/s
Area-P 1/2: 3.91 cm2
S' Lateral: 3.4 cm

## 2022-02-28 LAB — PROCALCITONIN: Procalcitonin: 22.17 ng/mL

## 2022-02-28 LAB — BRAIN NATRIURETIC PEPTIDE: B Natriuretic Peptide: 562.4 pg/mL — ABNORMAL HIGH (ref 0.0–100.0)

## 2022-02-28 MED ORDER — PANTOPRAZOLE SODIUM 40 MG PO TBEC
40.0000 mg | DELAYED_RELEASE_TABLET | Freq: Two times a day (BID) | ORAL | Status: DC
  Administered 2022-02-28 – 2022-03-02 (×5): 40 mg via ORAL
  Filled 2022-02-28 (×5): qty 1

## 2022-02-28 MED ORDER — ADULT MULTIVITAMIN W/MINERALS CH
1.0000 | ORAL_TABLET | Freq: Every day | ORAL | Status: DC
  Administered 2022-02-28 – 2022-03-02 (×3): 1 via ORAL
  Filled 2022-02-28 (×3): qty 1

## 2022-02-28 MED ORDER — THIAMINE HCL 100 MG/ML IJ SOLN
100.0000 mg | Freq: Every day | INTRAMUSCULAR | Status: DC
Start: 2022-02-28 — End: 2022-03-02

## 2022-02-28 MED ORDER — SODIUM CHLORIDE 0.9 % IV SOLN: INTRAVENOUS | Status: AC

## 2022-02-28 MED ORDER — THIAMINE HCL 100 MG PO TABS
100.0000 mg | ORAL_TABLET | Freq: Every day | ORAL | Status: DC
Start: 2022-02-28 — End: 2022-03-02
  Administered 2022-03-01 – 2022-03-02 (×2): 100 mg via ORAL
  Filled 2022-02-28 (×2): qty 1

## 2022-02-28 MED ORDER — LORAZEPAM 1 MG PO TABS
1.0000 mg | ORAL_TABLET | ORAL | Status: DC | PRN
  Administered 2022-02-28 – 2022-03-01 (×2): 1 mg via ORAL
  Filled 2022-02-28 (×2): qty 1

## 2022-02-28 MED ORDER — METOPROLOL TARTRATE 5 MG/5ML IV SOLN: 5.0000 mg | Freq: Once | INTRAVENOUS | Status: DC

## 2022-02-28 MED ORDER — LORAZEPAM 2 MG/ML IJ SOLN: 1.0000 mg | INTRAMUSCULAR | Status: DC | PRN

## 2022-02-28 MED ORDER — FOLIC ACID 1 MG PO TABS
1.0000 mg | ORAL_TABLET | Freq: Every day | ORAL | Status: DC
  Administered 2022-02-28 – 2022-03-02 (×3): 1 mg via ORAL
  Filled 2022-02-28 (×3): qty 1

## 2022-02-28 MED ORDER — METOPROLOL TARTRATE 5 MG/5ML IV SOLN
5.0000 mg | Freq: Once | INTRAVENOUS | Status: AC
  Administered 2022-02-28: 5 mg via INTRAVENOUS
  Filled 2022-02-28: qty 5

## 2022-02-28 MED ORDER — FOLIC ACID 1 MG PO TABS: 1.0000 mg | ORAL_TABLET | Freq: Every day | ORAL | Status: DC

## 2022-02-28 MED ORDER — THIAMINE MONONITRATE 100 MG PO TABS
100.0000 mg | ORAL_TABLET | Freq: Every day | ORAL | Status: DC
  Administered 2022-02-28: 100 mg via ORAL
  Filled 2022-02-28 (×2): qty 1

## 2022-02-28 NOTE — Evaluation (Signed)
Physical Therapy Evaluation Patient Details Name: Krystal [name] MRN: [id number] DOB: 11/17/1950 Today's Date: 02/28/2022  History of Present Illness  Patient is a 71 year old female brought in the emergency room on 8/7 after being found down by her husband. AMS initially secondary to sepsis and dehydration, MRI negative for CVA. A-fib with RVR, diarrhea, UTI, AKI.  Clinical Impression  Patient is agreeable to PT and is eager to get out of bed. She reports she is independent at baseline with mobility without use of assistive device.  She lives with her spouse.  Today, the patient is moving well. She was able to ambulate over a household distance in the hallway with rolling walker with only Min guard assistance. Occasional cues for safety required with mobility. Sp02 89% or higher on room air and heart rate briefly up to 129bpm while ambulating. Anticipate patient can return home with family support. She is interested in [place]. PT will continue to follow to maximize independence and facilitate return to prior level of function.      Recommendations for follow up therapy are one component of a multi-disciplinary discharge planning process, led by the attending physician.  Recommendations may be updated based on patient status, additional functional criteria and insurance authorization.  Follow Up Recommendations Home health PT      Assistance Recommended at Discharge Set up Supervision/Assistance  Patient can return home with the following  Help with stairs or ramp for entrance;Assist for transportation    Equipment Recommendations None recommended by PT  Recommendations for Other Services       Functional Status Assessment Patient has had a recent decline in their functional status and demonstrates the ability to make significant improvements in function in a reasonable and predictable amount of time.     Precautions / Restrictions Precautions Precautions: Fall Restrictions Weight  Bearing Restrictions: No      Mobility  Bed Mobility Overal bed mobility: Needs Assistance Bed Mobility: Supine to Sit     Supine to sit: Min assist     General bed mobility comments: trunk support provided. verbal cues for sequencing and technique    Transfers Overall transfer level: Needs assistance Equipment used: Rolling walker (2 wheels), None Transfers: Sit to/from Stand, Bed to chair/wheelchair/BSC Sit to Stand: Min guard   Step pivot transfers: Min guard       General transfer comment: patient performed stand step transfer to the bed side commode with no assistive device with mild unsteadiness. verbal cues for hand placement for safety. Min guard assistance provided with standing from bed side commode    Ambulation/Gait Ambulation/Gait assistance: Min guard Gait Distance (Feet): 180 Feet Assistive device: Rolling walker (2 wheels) Gait Pattern/deviations: Step-through pattern, Step-to pattern Gait velocity: decreased     General Gait Details: step to pattern intiailly progressing to step through pattern with cues for proper use of rolling walker. Sp02 89% or higher on room air with ambulation. no dizziness reported with upright activity. encouraged patient to use rolling walker for safety with ambulation at this time and she reports she has a rolling walker at home. noted heart rate up to 129 briefly with walking  Stairs            Wheelchair Mobility    Modified Rankin (Stroke Patients Only)       Balance Overall balance assessment: Needs assistance Sitting-balance support: Feet supported Sitting balance-Leahy Scale: Good     Standing balance support: No upper extremity supported Standing balance-Leahy Scale: [place] Standing  balance comment: staic standing is fair without UE support. recommend rolling walker for dynamic activity/ambulation                             Pertinent Vitals/Pain Pain Assessment Pain Assessment:  Faces Faces Pain Scale: Hurts a little bit Pain Location: headache only with coughing Pain Descriptors / Indicators: Headache Pain Intervention(s): Limited activity within patient's tolerance, Monitored during session    Home Living Family/patient expects to be discharged to:: Private residence Living Arrangements: Spouse/significant other Available Help at Discharge: Family Type of Home: House Home Access: Stairs to enter Entrance Stairs-Rails: Right (R going up) Entrance Stairs-Number of Steps: 3 Alternate Level Stairs-Number of Steps: 13 Home Layout: Two level;1/2 bath on main level;Bed/bath upstairs Home Equipment: Conservation officer, nature (2 wheels)      Prior Function Prior Level of Function : Independent/Modified Independent;Driving;History of Falls (last six months)             Mobility Comments: multiple falls report over the past 6 months. patient is ambulatory without assistive device ADLs Comments: independent     Hand Dominance        Extremity/Trunk Assessment   Upper Extremity Assessment Upper Extremity Assessment: Defer to OT evaluation    Lower Extremity Assessment Lower Extremity Assessment: Overall WFL for tasks assessed       Communication   Communication: No difficulties  Cognition Arousal/Alertness: Awake/alert Behavior During Therapy: WFL for tasks assessed/performed Overall Cognitive Status: Within Functional Limits for tasks assessed                                 General Comments: patient is able to follow single step commands consistently. she is easily distracted and needs cues for attention to task at times.        General Comments      Exercises     Assessment/Plan    PT Assessment Patient needs continued PT services  PT Problem List Decreased strength;Decreased activity tolerance;Decreased balance;Decreased mobility;Decreased knowledge of use of DME       PT Treatment Interventions DME instruction;Gait  training;Stair training;Functional mobility training;Therapeutic activities;Therapeutic exercise;Balance training;Patient/family education;Neuromuscular re-education    PT Goals (Current goals can be found in the Care Plan section)  Acute Rehab PT Goals Patient Stated Goal: to return home PT Goal Formulation: With patient Time For Goal Achievement: 03/14/22 Potential to Achieve Goals: Good    Frequency Min 2X/week     Co-evaluation PT/OT/SLP Co-Evaluation/Treatment: Yes (partial co-evaluation with OT) Reason for Co-Treatment: Complexity of the patient's impairments (multi-system involvement) PT goals addressed during session: Mobility/safety with mobility         AM-PAC PT "6 Clicks" Mobility  Outcome Measure Help needed turning from your back to your side while in a flat bed without using bedrails?: A Little Help needed moving from lying on your back to sitting on the side of a flat bed without using bedrails?: A Little Help needed moving to and from a bed to a chair (including a wheelchair)?: A Little Help needed standing up from a chair using your arms (e.g., wheelchair or bedside chair)?: A Little Help needed to walk in hospital room?: A Little Help needed climbing 3-5 steps with a railing? : A Little 6 Click Score: 18    End of Session Equipment Utilized During Treatment: Gait belt Activity Tolerance: Patient tolerated treatment well Patient left: in chair;with  call bell/phone within reach (set-up with breakfast tray) Nurse Communication: Mobility status PT Visit Diagnosis: Unsteadiness on feet (R26.81);Muscle weakness (generalized) (M62.81)    Time: 6484-7207 PT Time Calculation (min) (ACUTE ONLY): 34 min   Charges:   PT Evaluation $PT Eval Moderate Complexity: 1 Mod PT Treatments $Gait Training: 8-22 mins        Minna Merritts, PT, MPT  Percell Locus 02/28/2022, 11:58 AM

## 2022-02-28 NOTE — Progress Notes (Addendum)
PHARMACIST - PHYSICIAN COMMUNICATION  CONCERNING: IV to Oral Route Change Policy  RECOMMENDATION: This patient is receiving pantoprazole, thiamine, folic acid by the intravenous route.  Based on criteria approved by the Pharmacy and Therapeutics Committee, the intravenous medication(s) is/are being converted to the equivalent oral dose form(s).   DESCRIPTION: These criteria include: The patient is eating (either orally or via tube) and/or has been taking other orally administered medications for a least 24 hours The patient has no evidence of active gastrointestinal bleeding or impaired GI absorption (gastrectomy, short bowel, patient on TNA or NPO).  If you have questions about this conversion, please contact the [place], [facility] 02/28/2022 9:00 AM

## 2022-02-28 NOTE — Progress Notes (Signed)
Progress Note   Patient: Krystal [name] ZSW:[id number] DOB: [birth year] DOA: 02/26/2022     1 DOS: the patient was seen and examined on 02/28/2022   Brief hospital course: 71 year old female with past medical history of morbid obesity, hypertension and sleep apnea and atrial fibrillation brought in the emergency room on 8/7 after being found down by her husband.  Patient's husband had been away on a trip for several days and upon return, found patient on floor with diarrhea found in various places around the house.  Patient had last spoke to his wife on the morning of 8/5 and at that time, was okay.  In the emergency room, patient found to be confused as well as in atrial fibrillation with rapid ventricular rate and lab work noteworthy for acute kidney injury, rhabdomyolysis and possibly sepsis from urinary tract infection.  Patient was admitted to the hospitalist service and started on IV fluids and antibiotics for sepsis secondary to urinary tract infection.  By following day, patient much more awake and alert.  Blood pressures and renal function improved.  Urine culture and urine drug screen pending.  Stool cultures negative.  8/9: Patient seems improving, heart rate intermittently goes in low 100s.  Urine culture with E. Coli.  Complaining of some shakiness with history of daily alcohol use.  Placed on CIWA.   Assessment and Plan: * Atrial fibrillation with RVR (HCC) Initial presentation of A-fib RVR with heart rates in the 120s. Post hydration heart rate has improved somewhat and rate is about 88.  Still having episodes of heart rate above 100 . She was started on Cardizem and Eliquis -Continue IV fluid for another day at a lower rate of 75 mL/h -Continue Cardizem and Eliquis  Urinary tract infection Given that patient does not meet SIRS criteria and tachycardia and tachypnea could be secondary to atrial fibrillation and volume depletion, sepsis ruled out.  Urine cultures growing E.  coli-pending susceptibility. -Continue with ceftriaxone  AMS (altered mental status)-resolved as of 02/27/2022 Secondary to sepsis and dehydration.  MRI negative for CVA.  Currently she is alert and oriented x3.  According to husband, she appears to be close to 90% to baseline.  Patient has no memory of what happened.  She states on Saturday 8/5 morning, she was in a rush to get to the [employer] and had forgotten to take her morning medications, but had plan to after she got home.  She does not remember much after that until she woke up today.  Diarrhea Resolved. Unclear etiology.  Possible transient viral gastroenteritis?  Stool cultures here negative.  Patient with no further diarrhea.   Acute kidney injury superimposed on Stage 3B chronic kidney disease ([place]) Acute kidney injury secondary to diarrhea and decreased perfusion with A-fib.  Renal functions improving but still above baseline, creatinine at 1.85 today with baseline around 1  Continue IV fluid for another day.  Electrolyte abnormality Hyponatremia improving with IV hydration, potassium at 3.4 today. -Continue with IV fluid -Replete potassium and monitor  Alcohol abuse Patient with some shakiness, history of daily drinking. -Placed her on CIWA protocol  Abnormal LFTs Alcohol level normal, but she admits drinking daily. Can be due to chronic alcohol use or shock liver -Continue to monitor   Hypertension Held blood pressure medications secondary to softer blood pressures.  Have started Cardizem for her atrial fibrillation  Lactic acidosis Secondary to intravascular volume depletion from diarrhea.  No evidence of sepsis, sepsis ruled out.  Patient has received  aggressive IV fluid hydration and repeat lactic resolved.   Gastroesophageal reflux disease - Continue PPI  Morbid obesity ([place]) Meets criteria with BMI greater than 35 and comorbidity of hypertension.  Rhabdomyolysis-resolved as of 02/27/2022 Traumatic  rhabdo from fall.  Mild Continue with IV fluid hydration.   Subjective: Patient was sitting comfortably in chair when seen today.  She was complaining of some shakiness.  No chest pain or shortness of breath  Physical Exam: Vitals:   02/28/22 1309 02/28/22 1422 02/28/22 1430 02/28/22 1440  BP: 110/63 (!) 94/51 126/69 122/64  Pulse: 97 (!) 106 (!) 102 86  Resp: (!) '21 20 12 18  '$ Temp:  98.3 F (36.8 C) (!) 102 F (38.9 C) 98.9 F (37.2 C)  TempSrc:    Oral  SpO2: 92% 99% 98% 95%  Weight:      Height:       General.  Obese lady, in no acute distress. Pulmonary.  Lungs clear bilaterally, normal respiratory effort. CV.  Irregularly irregular Abdomen.  Soft, nontender, nondistended, BS positive. CNS.  Alert and oriented .  No focal neurologic deficit. Extremities.  No edema, no cyanosis, pulses intact and symmetrical. Psychiatry.  Judgment and insight appears normal.  Data Reviewed: Prior data reviewed  Family Communication: Discussed with husband at bedside  Disposition: Status is: Inpatient Remains inpatient appropriate because: Severity of illness   Planned Discharge Destination: Home with Home Health  DVT prophylaxis.  Eliquis Time spent: 50 minutes  This record has been created using Systems analyst. Errors have been sought and corrected,but may not always be located. Such creation errors do not reflect on the standard of care.  Author: Lorella Nimrod, MD 02/28/2022 4:13 PM  For on call review [email or web address].

## 2022-02-28 NOTE — TOC Initial Note (Signed)
Transition of Care [facility]) - Initial/Assessment Note    Patient Details  Name: Krystal [name] MRN: [id number] Date of Birth: 02/23/1951  [facility] of [facility]) CM/SW Contact:    Shelbie Hutching, RN Phone Number: 02/28/2022, 6:27 PM  Clinical Narrative:                 Patient admitted to the hospital with altered mental status, hypokalemia, and afib.  RNCM met with patient at the bedside, her husband also present.  Patient is from home with her husband, independent at baseline, she does have a walker and bedside commode at home, she has had knee surgery in the past with home health services.  She does not have a home health agency preference.   Home health referral for PT and OT given to Juanda Crumble with [facility].  Patient is current with her PCP Dr. Kary Kos.  Husband will provide transport at discharge.    Expected Discharge Plan: [place] Barriers to Discharge: Continued Medical Work up   Patient Goals and CMS Choice Patient states their goals for this hospitalization and ongoing recovery are:: wants to be independent again and not have to use the walker and bedside commode CMS Medicare.gov Compare Post Acute Care list provided to:: Patient Choice offered to / list presented to : Patient  Expected Discharge Plan and Services Expected Discharge Plan: [place]   Discharge Planning Services: CM Consult Post Acute Care Choice: [place] arrangements for the past 2 months: Single Family Home                 DME Arranged: N/A DME Agency: NA       HH Arranged: PT, OT HH Agency: [facility] Date [place] Agency Contacted: 02/28/22 Time [place] Agency Contacted: [id number] Representative spoke with at [place]: Juanda Crumble  Prior Living Arrangements/Services Living arrangements for the past 2 months: [place] Lives with:: Spouse Patient language and need for interpreter reviewed:: Yes Do you feel safe going back to the place  where you live?: Yes      Need for Family Participation in Patient Care: Yes (Comment) Care giver support system in place?: Yes (comment) Current home services: DME (Rolling walker, BSC) Criminal Activity/Legal Involvement Pertinent to Current Situation/Hospitalization: No - Comment as needed  Activities of Daily Living Home Assistive Devices/Equipment: Walker (specify type), Other (Comment) ADL Screening (condition at time of admission) Patient's cognitive ability adequate to safely complete daily activities?: Yes Is the patient deaf or have difficulty hearing?: No Does the patient have difficulty seeing, even when wearing glasses/contacts?: No Does the patient have difficulty concentrating, remembering, or making decisions?: No Patient able to express need for assistance with ADLs?: Yes Does the patient have difficulty dressing or bathing?: No Independently performs ADLs?: Yes (appropriate for developmental age) Does the patient have difficulty walking or climbing stairs?: No Weakness of Legs: None Weakness of Arms/Hands: None  Permission Sought/Granted Permission sought to share information with : Case Manager, Family Supports, Other (comment) Permission granted to share information with : Yes, Verbal Permission Granted  Share Information with NAME: Anique Beckley  Permission granted to share info w AGENCY: Home health Agency  Permission granted to share info w Relationship: husband  Permission granted to share info w Contact Information: [phone]  Emotional Assessment Appearance:: Appears stated age Attitude/Demeanor/Rapport: Engaged Affect (typically observed): Accepting Orientation: : Oriented to Self, Oriented to Place, Oriented to  Time, Oriented to Situation Alcohol /  Substance Use: Alcohol Use Psych Involvement: No (comment)  Admission diagnosis:  Hypokalemia [E87.6] AKI (acute kidney injury) (HCC) [N17.9] Altered mental status, unspecified altered mental status  type [R41.82] AMS (altered mental status) [R41.82] Patient Active Problem List   Diagnosis Date Noted   Depression 02/27/2022   Gastroesophageal reflux disease 02/27/2022   Hypertension 02/27/2022   Electrolyte abnormality 02/27/2022   Abnormal LFTs 02/27/2022   Lactic acidosis 02/27/2022   Alcohol abuse 02/27/2022   Atrial fibrillation with RVR (HCC) 02/27/2022   Acute kidney injury superimposed on Stage 3B chronic kidney disease (HCC) 02/27/2022   Morbid obesity (HCC) 02/27/2022   Urinary tract infection 02/27/2022   Diarrhea 02/27/2022   Status post total knee replacement using cement, left 09/22/2019   Status post total knee replacement using cement, right 08/12/2018   Rotator cuff rupture    Acquired absence of bilateral breasts and nipples 09/17/2012   Edema 07/27/2011   Breast cancer of lower-outer quadrant of left female breast (HCC) 01/16/2011   PCP:  [name], [name], MD Pharmacy:   [facility] - [place], [place] - [address] [place] [address] Phone: [phone] Fax: [phone]  [id number] - [place], [place] - [address] AT [address] OF [address] [place] [address] Phone: [phone] Fax: [phone]     Social Determinants of Health (SDOH) Interventions    Readmission Risk Interventions    02/28/2022    6:14 PM  Readmission Risk Prevention Plan  Transportation Screening Complete  PCP or Specialist Appt within 3-5 Days Complete  HRI or Home Care Consult Complete  Social Work Consult for Recovery Care Planning/Counseling Not Complete  SW consult not completed comments NA  Palliative Care Screening Not Applicable  Medication Review (RN Care Manager) Referral to Pharmacy     

## 2022-02-28 NOTE — Progress Notes (Signed)
SLP F/U Note  Patient Details Name: Krystal [name] MRN: [id number] DOB: [birth year]   Cancelled treatment:       Reason Eval/Treat Not Completed:  (chart reviewed). No decline in status noted. Met w/ pt and NSG in room. NSG denied any difficulty swallowing pills, meals. Discussed w/ pt as well who denied any problems swallowing at her breakfast meal as well as a few items at her lunch meal. Talked about the general aspiration and Reflux precautions recommended at assessment yesterday; pt recalled need to sit upright, use small sips/bites, and stay sitting up after meals.  Shared this information w/ NSG as well.  Recommend continue current general precautions at meals; diet. No further skilled ST services indicated. Pt and NSG agreed. NSG to reconsult if any new changes in status while admitted.      Orinda Kenner, MS, CCC-SLP Speech Language Pathologist Rehab Services; [place] [phone] (ascom) Elandra Powell 02/28/2022, 2:49 PM

## 2022-02-28 NOTE — Evaluation (Signed)
Occupational Therapy Evaluation Patient Details Name: Krystal [name] MRN: [id number] DOB: [birth year] Today's Date: 02/28/2022   History of Present Illness Patient is a 71 year old female brought in the emergency room on 8/7 after being found down by her husband. AMS initially secondary to sepsis and dehydration, MRI negative for CVA. A-fib with RVR, diarrhea, UTI, AKI.   Clinical Impression   Ms Almon was seen for OT evaluation this date. Prior to hospital admission, pt was Independent for mobility and ADLs. Pt lives with spouse in 2 level home with bedrooms upstairs. Pt presents to acute OT demonstrating impaired ADL performance and functional mobility 2/2 decreased activity tolerance and functional strength/ROM/balance deficits. Pt is alert and oriented, cues t/o session to redirect to task.   Pt currently requires MIN A bed mobility, CGA for BSC t/f. MIN A don/doff underwear at [employer], assist for threading over feet. CGA + RW for ~200 ft mobility, +1 for lines mgmt. Max HR 136 during mobility. Pt would benefit from skilled OT to address noted impairments and functional limitations (see below for any additional details). Upon hospital discharge, recommend HHOT to maximize pt safety and return to PLOF.    Recommendations for follow up therapy are one component of a multi-disciplinary discharge planning process, led by the attending physician.  Recommendations may be updated based on patient status, additional functional criteria and insurance authorization.   Follow Up Recommendations  Home health OT    Assistance Recommended at Discharge Intermittent Supervision/Assistance  Patient can return home with the following A little help with walking and/or transfers;A little help with bathing/dressing/bathroom;Help with stairs or ramp for entrance    Functional Status Assessment  Patient has had a recent decline in their functional status and demonstrates the ability to make significant improvements in  function in a reasonable and predictable amount of time.  Equipment Recommendations  BSC/3in1    Recommendations for Other Services       Precautions / Restrictions Precautions Precautions: Fall Restrictions Weight Bearing Restrictions: No      Mobility Bed Mobility Overal bed mobility: Needs Assistance Bed Mobility: Supine to Sit     Supine to sit: Min assist     General bed mobility comments: trunk support provided. verbal cues for sequencing and technique    Transfers Overall transfer level: Needs assistance Equipment used: Rolling walker (2 wheels), None Transfers: Sit to/from Stand, Bed to chair/wheelchair/BSC Sit to Stand: Min guard     Step pivot transfers: Min guard            Balance Overall balance assessment: Needs assistance Sitting-balance support: Feet supported Sitting balance-Leahy Scale: Good     Standing balance support: No upper extremity supported Standing balance-Leahy Scale: Fair Standing balance comment: staic standing is fair without UE support. recommend rolling walker for dynamic activity/ambulation                           ADL either performed or assessed with clinical judgement   ADL Overall ADL's : Needs assistance/impaired                                       General ADL Comments: MIN A don/doff underwear      Pertinent Vitals/Pain Pain Assessment Faces Pain Scale: Hurts a little bit Pain Location: headache only with coughing Pain Descriptors / Indicators: Headache  Hand Dominance     Extremity/Trunk Assessment Upper Extremity Assessment Upper Extremity Assessment: Overall WFL for tasks assessed   Lower Extremity Assessment Lower Extremity Assessment: Generalized weakness       Communication Communication Communication: No difficulties   Cognition Arousal/Alertness: Awake/alert Behavior During Therapy: WFL for tasks assessed/performed Overall Cognitive Status: Within  Functional Limits for tasks assessed                                 General Comments: patient is able to follow single step commands consistently. she is easily distracted and needs cues for attention to task at times.                Home Living Family/patient expects to be discharged to:: Private residence Living Arrangements: Spouse/significant other Available Help at Discharge: Family Type of Home: House Home Access: Stairs to enter Technical brewer of Steps: 3 Entrance Stairs-Rails: Right (R going up) Home Layout: Two level;1/2 bath on main level;Bed/bath upstairs Alternate Level Stairs-Number of Steps: 13 Alternate Level Stairs-Rails: Right (R going up)           Home Equipment: [place] (2 wheels)          Prior Functioning/Environment Prior Level of Function : Independent/Modified Independent;Driving;History of Falls (last six months)             Mobility Comments: multiple falls report over the past 6 months. patient is ambulatory without assistive device ADLs Comments: independent        OT Problem List: Decreased strength;Decreased range of motion;Decreased activity tolerance;Impaired balance (sitting and/or standing);Decreased safety awareness      OT Treatment/Interventions: Self-care/ADL training;Therapeutic exercise;DME and/or AE instruction;Energy conservation;Therapeutic activities;Patient/family education;Balance training    OT Goals(Current goals can be found in the care plan section) Acute Rehab OT Goals Patient Stated Goal: to go home OT Goal Formulation: With patient Time For Goal Achievement: 03/14/22 Potential to Achieve Goals: Good ADL Goals Pt Will Perform Grooming: Independently;standing Pt Will Perform Lower Body Dressing: Independently;sit to/from stand Pt Will Transfer to Toilet: Independently;ambulating;regular height toilet  OT Frequency: Min 2X/week    Co-evaluation PT/OT/SLP  Co-Evaluation/Treatment: Yes Reason for Co-Treatment: For patient/therapist safety;To address functional/ADL transfers PT goals addressed during session: Mobility/safety with mobility OT goals addressed during session: ADL's and self-care      AM-PAC OT "6 Clicks" Daily Activity     Outcome Measure Help from another person eating meals?: None Help from another person taking care of personal grooming?: A Little Help from another person toileting, which includes using toliet, bedpan, or urinal?: A Little Help from another person bathing (including washing, rinsing, drying)?: A Little Help from another person to put on and taking off regular upper body clothing?: None Help from another person to put on and taking off regular lower body clothing?: A Little 6 Click Score: 20   End of Session Equipment Utilized During Treatment: Rolling walker (2 wheels) Nurse Communication: Mobility status  Activity Tolerance: Patient tolerated treatment well Patient left: in chair;with call bell/phone within reach  OT Visit Diagnosis: Muscle weakness (generalized) (M62.81)                Time: 8341-9622 OT Time Calculation (min): 24 min Charges:  OT General Charges $OT Visit: 1 Visit OT Evaluation $OT Eval Moderate Complexity: 1 Mod OT Treatments $Self Care/Home Management : 8-22 mins  Dessie Coma, M.S. OTR/L  02/28/22, 12:38 PM  ascom [phone]

## 2022-02-28 NOTE — Progress Notes (Signed)
       CROSS COVER NOTE  NAME: JERRIE SCHUSSLER MRN: [id number] DOB : August 28, 1950    Date of Service   02/28/22  HPI/Events of Note   Notified by nursing that M(r)s Snowdon's HR is sustaining in the 150s.  Interventions   Plan:  EKG- Atrial flutter HR 145 Metoprolol 5 mg IVx1     This document was prepared using Dragon voice recognition software and may include unintentional dictation errors.  Neomia Glass DNP, MHA, FNP-BC Nurse Practitioner [facility] Pager [phone]

## 2022-03-01 DIAGNOSIS — I4891 Unspecified atrial fibrillation: Secondary | ICD-10-CM | POA: Diagnosis not present

## 2022-03-01 LAB — CBC
HCT: 36.4 % (ref 36.0–46.0)
Hemoglobin: 12.8 g/dL (ref 12.0–15.0)
MCH: 34.4 pg — ABNORMAL HIGH (ref 26.0–34.0)
MCHC: 35.2 g/dL (ref 30.0–36.0)
MCV: 97.8 fL (ref 80.0–100.0)
Platelets: 95 10*3/uL — ABNORMAL LOW (ref 150–400)
RBC: 3.72 MIL/uL — ABNORMAL LOW (ref 3.87–5.11)
RDW: 14.5 % (ref 11.5–15.5)
WBC: 8.5 10*3/uL (ref 4.0–10.5)
nRBC: 0 % (ref 0.0–0.2)

## 2022-03-01 LAB — BASIC METABOLIC PANEL
Anion gap: 9 (ref 5–15)
BUN: 22 mg/dL (ref 8–23)
CO2: 20 mmol/L — ABNORMAL LOW (ref 22–32)
Calcium: 8.4 mg/dL — ABNORMAL LOW (ref 8.9–10.3)
Chloride: 107 mmol/L (ref 98–111)
Creatinine, Ser: 1.3 mg/dL — ABNORMAL HIGH (ref 0.44–1.00)
GFR, Estimated: 45 mL/min — ABNORMAL LOW (ref >60)
Glucose, Bld: 163 mg/dL — ABNORMAL HIGH (ref 70–99)
Potassium: 3.3 mmol/L — ABNORMAL LOW (ref 3.5–5.1)
Sodium: 136 mmol/L (ref 135–145)

## 2022-03-01 LAB — URINE CULTURE: Culture: >=100000 — AB

## 2022-03-01 LAB — MAGNESIUM: Magnesium: 1.7 mg/dL (ref 1.7–2.4)

## 2022-03-01 MED ORDER — MAGNESIUM OXIDE 400 MG PO TABS
400.0000 mg | ORAL_TABLET | Freq: Every day | ORAL | Status: DC
  Administered 2022-03-01: 400 mg via ORAL
  Filled 2022-03-01 (×3): qty 1

## 2022-03-01 MED ORDER — DILTIAZEM HCL 30 MG PO TABS
60.0000 mg | ORAL_TABLET | Freq: Four times a day (QID) | ORAL | Status: DC
  Administered 2022-03-01 – 2022-03-02 (×3): 60 mg via ORAL
  Filled 2022-03-01 (×3): qty 2

## 2022-03-01 MED ORDER — MAGNESIUM SULFATE 2 GM/50ML IV SOLN
2.0000 g | Freq: Once | INTRAVENOUS | Status: AC
Start: 2022-03-01 — End: 2022-03-02
  Administered 2022-03-01: 2 g via INTRAVENOUS
  Filled 2022-03-01: qty 50

## 2022-03-01 MED ORDER — POTASSIUM CHLORIDE CRYS ER 20 MEQ PO TBCR
40.0000 meq | EXTENDED_RELEASE_TABLET | Freq: Two times a day (BID) | ORAL | Status: AC
Start: 2022-03-01 — End: 2022-03-01
  Administered 2022-03-01 (×2): 40 meq via ORAL
  Filled 2022-03-01 (×2): qty 2

## 2022-03-01 MED ORDER — CEPHALEXIN 500 MG PO CAPS
500.0000 mg | ORAL_CAPSULE | Freq: Two times a day (BID) | ORAL | Status: DC
  Administered 2022-03-01 – 2022-03-02 (×2): 500 mg via ORAL
  Filled 2022-03-01 (×2): qty 1

## 2022-03-01 NOTE — Assessment & Plan Note (Signed)
Acute kidney injury secondary to diarrhea and decreased perfusion with A-fib.  Renal functions improving but still above baseline, creatinine at 1.30 today with baseline around 1  -Encourage p.o. hydration -Avoid nephrotoxins

## 2022-03-01 NOTE — Assessment & Plan Note (Signed)
CIWA score of 4.  Patient appears much calmer today. -Continue with CIWA protocol

## 2022-03-01 NOTE — Assessment & Plan Note (Signed)
Blood pressure currently within goal. -Currently being managed with Cardizem -Keep holding home meds

## 2022-03-01 NOTE — Assessment & Plan Note (Signed)
Initial presentation of A-fib RVR with heart rates in the 120s. Post hydration heart rate has improved somewhat and rate is about 88.  Still having episodes of heart rate above 100 . She was started on Cardizem and Eliquis -Continue Cardizem and Eliquis.

## 2022-03-01 NOTE — Consult Note (Addendum)
[place] NOTE       Patient ID: Krystal [name] MRN: [id number] DOB/AGE: February 06, 1951 71 y.o.  Admit date: 02/26/2022 Referring Physician Dr. Reesa Chew  Primary Physician Dr. [name]  Primary Cardiologist Dr. Clayborn Bigness Reason for Consultation AF RVR  HPI: Krystal [name] is a 34yoF with PMH of hypertension, OSA, morbid obesity, paroxysmal atrial fibrillation, type 2 diabetes, significant alcohol use, fibromyalgia, depression who presented to [facility] ED 02/26/2022 after being found down by her husband.  The patient's husband had been away on a trip for several days and found the patient on the floor with diarrhea in various places around her home.  Initially in the ED the patient was encephalopathic, was in atrial fibrillation with RVR, febrile, with a significant AKI.  Urine culture grew E. coli she has been treated with empiric antibiotics.  Cardiology is consulted on hospital day 3 due to periods of A-fib with RVR.  The patient presents with her husband who contributes to the history.  They report the above history of the patient being found down and significantly confused.  The patient herself does not recall these events.  She says she feels much better now than when she came in, although feels very fatigued.  She denies any chest pain, shortness of breath, dizziness.  She has chronic lymphedema in both legs although this appears improved to her and her husband on exam today.  Her husband notes she was seen by her oncologist last September and an EKG showed she was in atrial fibrillation, however nothing appeared to have been done about it at that time.  On admission she was in atrial fibrillation with heart rate in the low 100s, rate controlled on Cardizem 30 mg every 6 hours.  Yesterday evening she became significantly tachycardic to the 130s to 140s in atrial flutter which improved somewhat after a dose of IV metoprolol.  This morning review of telemetry reveals mostly atrial  flutter rate controlled in the 80s to 90s.  She is a never smoker, but does report drinking 2 x 32 ounce bottles of alcohol per day (although her husband tells me it is more like 5 x 32 ounces per day).   Vitals are notable for a blood pressure of 135/67, heart rate 91 and atrial flutter on telemetry.  She was febrile overnight to 100.4, and temperature is 99 F this morning.  Labs are notable for significant AKI on presentation with BUN/creatinine 54/2.84, GFR 18, much improved today with BUN 22, creatinine 1.3, GFR 45.  Potassium low at 3.3, magnesium in the low end of normal at 1.7.  High-sensitivity troponin negative at 17-14.  CK elevated at 457, BNP also elevated at 562.  Procalcitonin 22.  H&H stable at 12/36, thrombocytopenia with platelets 95.  Chest x-ray without acute abnormalities.  Head CT negative.  Review of systems complete and found to be negative unless listed above     Past Medical History:  Diagnosis Date   Anemia    Arthritis    osteo   Blindness of right eye at birth   Cancer [facility]) 2011   Left breast 2011 and cervical age 78   Carpal tunnel syndrome    Cataract    Depression    Edema 07/27/2011   Fibromyalgia    muscle weakness and pain   GERD (gastroesophageal reflux disease)    Hyperlipidemia    Hypertension    benign   Lymphedema    Osteoporosis    Plantar fasciitis  Pre-diabetes    Raynaud's disease    Restless leg syndrome    Rosacea    Rotator cuff rupture    Sleep apnea    Synovitis    Ulcer     Past Surgical History:  Procedure Laterality Date   ABDOMINAL HYSTERECTOMY  1986   For bleeding and pain   BLADDER SURGERY  2006   Bladder tack   BREAST RECONSTRUCTION Bilateral 09/17/2012   Procedure: BREAST RECONSTRUCTION;  Surgeon: Theodoro Kos, DO;  Location: [place];  Service: Plastics;  Laterality: Bilateral;  BILATERAL BREAST RECONSTRUCTION WITH TISSUE EXPANDERS AND ALLOMED   BREAST RECONSTRUCTION Right 06/03/2013    Procedure: RIGHT BREAST CAPSULE CONSTRACTURE;  Surgeon: Theodoro Kos, DO;  Location: [place];  Service: Plastics;  Laterality: Right;   CARPAL TUNNEL RELEASE Bilateral 2008   EYE SURGERY Right    INJECTION KNEE Left 08/12/2018   Procedure: KNEE INJECTION-LEFT;  Surgeon: Corky Mull, MD;  Location: [facility];  Service: Orthopedics;  Laterality: Left;   JOINT REPLACEMENT     LIPOSUCTION Bilateral 01/29/2013   Procedure: LIPOSUCTION;  Surgeon: Theodoro Kos, DO;  Location: [place];  Service: Plastics;  Laterality: Bilateral;   LIPOSUCTION Right 06/03/2013   Procedure: LIPOSUCTION;  Surgeon: Theodoro Kos, DO;  Location: [place];  Service: Plastics;  Laterality: Right;   MASTECTOMY  2012   rt prophalactic mast-snbx   MASTECTOMY MODIFIED RADICAL  2012   left-axillary nodes   NEUROMA SURGERY Bilateral    NOSE SURGERY  2007   PORT-A-CATH REMOVAL     insertion and   THROAT SURGERY     TONSILLECTOMY     TOTAL KNEE ARTHROPLASTY Right 08/12/2018   Procedure: TOTAL KNEE ARTHROPLASTY-RIGHT;  Surgeon: Corky Mull, MD;  Location: [facility];  Service: Orthopedics;  Laterality: Right;   TOTAL KNEE ARTHROPLASTY Left 09/22/2019   Procedure: TOTAL KNEE ARTHROPLASTY;  Surgeon: Corky Mull, MD;  Location: [facility];  Service: Orthopedics;  Laterality: Left;   UVULOPALATOPHARYNGOPLASTY      Medications Prior to Admission  Medication Sig Dispense Refill Last Dose   amLODipine (NORVASC) 5 MG tablet Take 5 mg by mouth daily.   Past Week   atenolol (TENORMIN) 100 MG tablet Take 100 mg by mouth every other day.    Past Week   cetirizine (ZYRTEC) 10 MG tablet Take 10 mg by mouth daily.   Past Week   cholecalciferol (VITAMIN D3) 25 MCG (1000 UT) tablet Take 1,000 Units by mouth every evening.    Past Week   fluticasone (FLONASE) 50 MCG/ACT nasal spray Place 2 sprays into both nostrils daily.   Past Week   furosemide (LASIX) 20 MG tablet Take 20 mg by mouth 2  (two) times daily.    Past Week   Glucosamine-Chondroitin (COSAMIN DS PO) Take 1 tablet by mouth 3 (three) times a week.   Past Week   lisinopril-hydrochlorothiazide (PRINZIDE,ZESTORETIC) 20-25 MG per tablet Take 1 tablet by mouth every other day.    Past Week   methocarbamol (ROBAXIN) 750 MG tablet Take 750 mg by mouth as needed.   Past Week   omeprazole (PRILOSEC) 20 MG capsule Take 20 mg by mouth daily.   Past Week   oxybutynin (DITROPAN-XL) 10 MG 24 hr tablet Take 1 tablet (10 mg total) by mouth at bedtime. 30 tablet 11 Past Week   potassium chloride (KLOR-CON) 10 MEQ tablet Take 10 mEq by mouth in the morning and at  bedtime.   Past Week   pregabalin (LYRICA) 300 MG capsule Take 300 mg by mouth 2 (two) times daily.     Past Week   rOPINIRole (REQUIP) 1 MG tablet Take 1 mg by mouth at bedtime.    Past Week   zolpidem (AMBIEN) 10 MG tablet Take 10 mg by mouth at bedtime.   Past Week   alendronate (FOSAMAX) 70 MG tablet Take 70 mg by mouth every Saturday. Take with a full glass of water on an empty stomach.    02/17/2022   carisoprodol (SOMA) 350 MG tablet Take 350 mg by mouth in the morning and at bedtime.      fluticasone (FLONASE) 50 MCG/ACT nasal spray Place 1 spray into both nostrils daily. (Patient not taking: Reported on 02/27/2022)   Not Taking   LYSINE ACETATE PO Take 4,000 mg by mouth daily as needed (cold sores).    prn at prn   magnesium oxide (MAG-OX) 400 MG tablet Take 400 mg by mouth daily as needed (leg cramps).   prn at prn   oxyCODONE (OXY IR/ROXICODONE) 5 MG immediate release tablet Take 1-2 tablets (5-10 mg total) by mouth every 4 (four) hours as needed for moderate pain. (Patient not taking: Reported on 02/27/2022) 60 tablet 0 Not Taking   traMADol (ULTRAM) 50 MG tablet Take 1-2 tablets (50-100 mg total) by mouth every 6 (six) hours as needed for moderate pain. (Patient not taking: Reported on 02/27/2022) 40 tablet 0 Not Taking   triamcinolone (KENALOG) 0.025 % cream APPLY 1  APPLICATION TOPICALLY DAILY AS NEEDED (RASH). 30 g 0 prn at prn   Social History   Socioeconomic History   Marital status: Married    Spouse name: Not on file   Number of children: Not on file   Years of education: Not on file   Highest education level: Not on file  Occupational History   Not on file  Tobacco Use   Smoking status: Never    Passive exposure: Never   Smokeless tobacco: Never  Vaping Use   Vaping Use: Never used  Substance and Sexual Activity   Alcohol use: Yes    Comment: 16 oz per day wine   Drug use: No   Sexual activity: Yes  Other Topics Concern   Not on file  Social History Narrative   Not on file   Social Determinants of Health   Financial Resource Strain: Not on file  Food Insecurity: Not on file  Transportation Needs: Not on file  Physical Activity: Not on file  Stress: Not on file  Social Connections: Not on file  Intimate Partner Violence: Not on file    Family History  Problem Relation Age of Onset   Cancer Mother 34       non hodgkins lymphoma   Cancer Father        neck, throat, lung   Lung cancer Father    Throat cancer Father    Hypertension Sister    Cancer Sister 76       Breast cancer dx'd 04/2011   Hypertension Brother    Breast cancer Maternal Aunt    Liver cancer Maternal Uncle    Cancer Maternal Grandmother        died 6, unknown cancer   Cancer Maternal Grandfather        died in his 18s; unknown cancer   Lung cancer Maternal Uncle    Throat cancer Maternal Uncle    Breast cancer Maternal Aunt  Cancer Cousin        died under the age 48, unknown cancer   Cancer Cousin        died age 6; unknown cancer      PHYSICAL EXAM General: Elderly and ill-appearing Caucasian female, in no acute distress.  Laying in incline in PCU bed with husband at bedside HEENT:  Normocephalic and atraumatic. Neck:  No JVD.  Lungs: Normal respiratory effort on room air. Clear bilaterally to auscultation. No wheezes, crackles,  rhonchi.  Heart: Irregular irregular rhythm with controlled rate. Normal S1 and S2 without gallops or murmurs. Abdomen: Non-distended appearing.  Msk: Normal strength and tone for age. Extremities: Warm and well perfused. No clubbing, cyanosis.  Trace bilateral lower extremity edema.  Neuro: Alert and oriented X 3. Psych:  Answers questions appropriately.   Labs:   Lab Results  Component Value Date   WBC 5.5 02/27/2022   HGB 11.1 (L) 02/27/2022   HCT 32.0 (L) 02/27/2022   MCV 100.6 (H) 02/27/2022   PLT 88 (L) 02/27/2022    Recent Labs  Lab 02/27/22 0621 02/28/22 0446  NA 132* 132*  K 3.3* 3.4*  CL 98 101  CO2 21* 24  BUN 53* 39*  CREATININE 2.32* 1.85*  CALCIUM 7.7* 7.7*  PROT 7.1  --   BILITOT 1.6*  --   ALKPHOS 71  --   ALT 30  --   AST 76*  --   GLUCOSE 83 121*   Lab Results  Component Value Date   CKTOTAL 457 (H) 02/27/2022    Lab Results  Component Value Date   CHOL 197 04/03/2019   CHOL 199 04/01/2018   CHOL 174 09/28/2014   Lab Results  Component Value Date   HDL 50 04/03/2019   HDL 56 04/01/2018   HDL 38 (L) 09/28/2014   Lab Results  Component Value Date   LDLCALC 124 (H) 04/03/2019   LDLCALC 116 (H) 04/01/2018   LDLCALC 97 09/28/2014   Lab Results  Component Value Date   TRIG 116 04/03/2019   TRIG 136 04/01/2018   TRIG 194 (H) 09/28/2014   Lab Results  Component Value Date   CHOLHDL 3.9 04/03/2019   CHOLHDL 3.6 04/01/2018   CHOLHDL 4.6 09/28/2014   No results found for: "LDLDIRECT"    Radiology: ECHOCARDIOGRAM COMPLETE BUBBLE STUDY  Result Date: 02/28/2022    ECHOCARDIOGRAM REPORT   Patient Name:   SYRITA DOVEL Date of Exam: 02/27/2022 Medical Rec #:  [id number]       Height:       63.0 in Accession #:    [id number]      Weight:       209.4 lb Date of Birth:  February 25, 1951        BSA:          1.972 m Patient Age:    73 years        BP:           Not listed in chart/Not listed in                                               chart mmHg  Patient Gender: F               HR:           145 bpm. Exam Location:  [facility] Procedure: 2D Echo, Cardiac Doppler, Color Doppler and Saline Contrast Bubble            Study Indications:     Syncope 780.2 / R55  History:         Patient has no prior history of Echocardiogram examinations.                  Risk Factors:Hypertension and Dyslipidemia.  Sonographer:     Sherrie Sport Referring Phys:  [place] Diagnosing Phys: Serafina Royals MD IMPRESSIONS  1. Left ventricular ejection fraction, by estimation, is 65 to 70%. The left ventricle has normal function. The left ventricle has no regional wall motion abnormalities. Left ventricular diastolic parameters were normal.  2. Right ventricular systolic function is normal. The right ventricular size is normal.  3. The mitral valve is normal in structure. Mild to moderate mitral valve regurgitation.  4. Tricuspid valve regurgitation is mild to moderate.  5. The aortic valve is normal in structure. Aortic valve regurgitation is not visualized. FINDINGS  Left Ventricle: Left ventricular ejection fraction, by estimation, is 65 to 70%. The left ventricle has normal function. The left ventricle has no regional wall motion abnormalities. The left ventricular internal cavity size was normal in size. There is  no left ventricular hypertrophy. Left ventricular diastolic parameters were normal. Right Ventricle: The right ventricular size is normal. No increase in right ventricular wall thickness. Right ventricular systolic function is normal. Left Atrium: Left atrial size was normal in size. Right Atrium: Right atrial size was normal in size. Pericardium: There is no evidence of pericardial effusion. Mitral Valve: The mitral valve is normal in structure. Mild to moderate mitral valve regurgitation. Tricuspid Valve: The tricuspid valve is normal in structure. Tricuspid valve regurgitation is mild to moderate. Aortic Valve: The aortic valve is normal in structure. Aortic valve  regurgitation is not visualized. Aortic valve mean gradient measures 1.5 mmHg. Aortic valve peak gradient measures 2.5 mmHg. Aortic valve area, by VTI measures 3.19 cm. Pulmonic Valve: The pulmonic valve was normal in structure. Pulmonic valve regurgitation is trivial. Aorta: The aortic root and ascending aorta are structurally normal, with no evidence of dilitation. IAS/Shunts: No atrial level shunt detected by color flow Doppler. Agitated saline contrast was given intravenously to evaluate for intracardiac shunting.  LEFT VENTRICLE PLAX 2D LVIDd:         5.40 cm LVIDs:         3.40 cm LV PW:         1.10 cm LV IVS:        1.00 cm LVOT diam:     2.10 cm LV SV:         40 LV SV Index:   20 LVOT Area:     3.46 cm  RIGHT VENTRICLE RV Basal diam:  3.60 cm RV S prime:     10.40 cm/s TAPSE (M-mode): 1.8 cm LEFT ATRIUM             Index        RIGHT ATRIUM           Index LA diam:        4.00 cm 2.03 cm/m   RA Area:     19.30 cm LA Vol (A2C):   46.0 ml 23.33 ml/m  RA Volume:   56.30 ml  28.55 ml/m LA Vol (A4C):   32.2 ml 16.33 ml/m LA Biplane Vol: 42.3 ml 21.45 ml/m  AORTIC VALVE AV Area (  Vmax):    3.01 cm AV Area (Vmean):   2.79 cm AV Area (VTI):     3.19 cm AV Vmax:           78.90 cm/s AV Vmean:          56.650 cm/s AV VTI:            0.125 m AV Peak Grad:      2.5 mmHg AV Mean Grad:      1.5 mmHg LVOT Vmax:         68.50 cm/s LVOT Vmean:        45.600 cm/s LVOT VTI:          0.115 m LVOT/AV VTI ratio: 0.92  AORTA Ao Root diam: 3.70 cm MITRAL VALVE                TRICUSPID VALVE MV Area (PHT): 3.91 cm     TR Peak grad:   24.4 mmHg MV Decel Time: 194 msec     TR Vmax:        247.00 cm/s MV E velocity: 103.00 cm/s                             SHUNTS                             Systemic VTI:  0.12 m                             Systemic Diam: 2.10 cm Serafina Royals MD Electronically signed by Serafina Royals MD Signature Date/Time: 02/28/2022/12:49:28 PM    Final    US Carotid Bilateral  Result Date:  02/27/2022 CLINICAL DATA:  [id number]. Stroke symptoms but with negative contemporaneous MRI. EXAM: BILATERAL CAROTID DUPLEX ULTRASOUND TECHNIQUE: Pearline Cables scale imaging, color Doppler and duplex ultrasound were performed of bilateral carotid and vertebral arteries in the neck. COMPARISON:  None Available. FINDINGS: Criteria: Quantification of carotid stenosis is based on velocity parameters that correlate the residual internal carotid diameter with NASCET-based stenosis levels, using the diameter of the distal internal carotid lumen as the denominator for stenosis measurement. The following velocity measurements were obtained: RIGHT ICA: 64 cm/sec CCA: 73.4 cm/sec SYSTOLIC ICA/CCA RATIO:  1.0 ECA: 80 cm/sec LEFT ICA: 80 cm/sec CCA: 28.7 cm/sec SYSTOLIC ICA/CCA RATIO:  1.3 ECA: 62 cm/sec RIGHT CAROTID ARTERY: There is a small amount of nonstenosing echogenic calcific plaque in the carotid bulb and cervical ICA origin. RIGHT VERTEBRAL ARTERY: There is antegrade flow with PSV 35.4 centimeters/second LEFT CAROTID ARTERY: Small amount of nonstenosing heterogeneous mixed plaque is seen in the carotid bulb and cervical ICA origin LEFT VERTEBRAL ARTERY: There is antegrade flow with PSV 35.1 centimeters/second Upper extremity blood pressures: RIGHT: Not measured. LEFT: Not measured. IMPRESSION: No Doppler evidence of hemodynamically significant (greater than 50%) carotid stenosis, with small amounts of nonstenosing plaque in the carotid bulbs and proximal cervical ICAs, and bilateral antegrade vertebral artery flow. Electronically Signed   By: Telford Nab M.D.   On: 02/27/2022 05:15   MR BRAIN WO CONTRAST  Result Date: 02/27/2022 CLINICAL DATA:  Acute neurologic deficit EXAM: MRI HEAD WITHOUT CONTRAST TECHNIQUE: Multiplanar, multiecho pulse sequences of the brain and surrounding structures were obtained without intravenous contrast. COMPARISON:  None Available. FINDINGS: Brain: No acute infarct, mass effect or extra-axial  collection. No acute or  chronic hemorrhage. There is multifocal hyperintense T2-weighted signal within the white matter. Generalized volume loss. The midline structures are normal. Vascular: Major flow voids are preserved. Skull and upper cervical spine: Normal calvarium and skull base. Visualized upper cervical spine and soft tissues are normal. Sinuses/Orbits:No paranasal sinus fluid levels or advanced mucosal thickening. No mastoid or middle ear effusion. Normal orbits. IMPRESSION: 1. No acute intracranial abnormality. 2. Findings of chronic small vessel ischemia and volume loss. Electronically Signed   By: Ulyses Jarred M.D.   On: 02/27/2022 03:17   DG Chest Portable 1 View  Result Date: 02/26/2022 CLINICAL DATA:  Shortness of breath.  Weakness. EXAM: PORTABLE CHEST 1 VIEW COMPARISON:  04/15/2020 FINDINGS: Upper normal heart size.The cardiomediastinal contours are normal. The lungs are clear. Pulmonary vasculature is normal. No consolidation, pleural effusion, or pneumothorax. No acute osseous abnormalities are seen. Her shoulder arthropathy. Surgical clips in the left axilla. IMPRESSION: No acute chest findings. Electronically Signed   By: Keith Rake M.D.   On: 02/26/2022 22:39   CT HEAD WO CONTRAST (5MM)  Result Date: 02/26/2022 CLINICAL DATA:  Mental status change, unknown cause, unwitnessed fall EXAM: CT HEAD WITHOUT CONTRAST TECHNIQUE: Contiguous axial images were obtained from the base of the skull through the vertex without intravenous contrast. RADIATION DOSE REDUCTION: This exam was performed according to the departmental dose-optimization program which includes automated exposure control, adjustment of the mA and/or kV according to patient size and/or use of iterative reconstruction technique. COMPARISON:  None Available. FINDINGS: Brain: No acute intracranial abnormality. Specifically, no hemorrhage, hydrocephalus, mass lesion, acute infarction, or significant intracranial injury.  Vascular: No hyperdense vessel or unexpected calcification. Skull: No acute calvarial abnormality. Sinuses/Orbits: No acute findings Other: None IMPRESSION: No acute intracranial abnormality. Electronically Signed   By: Rolm Baptise M.D.   On: 02/26/2022 21:21   DG Knee Complete 4 Views Right  Result Date: 02/26/2022 CLINICAL DATA:  Fall, pain EXAM: RIGHT KNEE - COMPLETE 4+ VIEW COMPARISON:  08/12/2018 FINDINGS: Prior right knee replacement. No acute bony abnormality. Specifically, no fracture, subluxation, or dislocation. No hardware complicating feature. No joint effusion. Soft tissues are intact. IMPRESSION: No acute bony abnormality. Electronically Signed   By: Rolm Baptise M.D.   On: 02/26/2022 21:16   DG Knee Complete 4 Views Left  Result Date: 02/26/2022 CLINICAL DATA:  Weakness, fall EXAM: LEFT KNEE - COMPLETE 4+ VIEW COMPARISON:  09/22/2019 FINDINGS: Prior left knee replacement. No hardware complicating feature. Insert bones small joint effusion. IMPRESSION: No acute bony abnormality.  Small joint effusion. Electronically Signed   By: Rolm Baptise M.D.   On: 02/26/2022 21:15     TELEMETRY reviewed by me: Overnight atrial flutter with RVR with rate in the 130s to 140s, the morning of 8/10 heart rate well-controlled in atrial flutter in the 80s to 90s.  EKG reviewed by me: 8/9 atrial flutter with variable block rate 145.  ASSESSMENT AND PLAN:  Krystal [name] is a 71yoF with PMH of hypertension, OSA, morbid obesity, paroxysmal atrial fibrillation, type 2 diabetes, significant alcohol use, fibromyalgia, depression who presented to [facility] ED 02/26/2022 after being found down by her husband.  The patient's husband had been away on a trip for several days and found the patient on the floor with diarrhea in various places around her home.  Initially in the ED the patient was encephalopathic, was in atrial fibrillation with RVR, febrile, with a significant AKI.  Urine culture grew E. coli she has been  treated with empiric  antibiotics.  Cardiology is consulted on hospital day 3 due to periods of A-fib with RVR.  # Paroxysmal atrial fibrillation/flutter with RVR # Sepsis 2/2 E. coli UTI # Significant alcohol use # Acute encephalopathy, resolved She presented to [facility] ED after being found down by her husband for an unknown amount of time (possibly 2-3 days) with diarrhea found throughout the home.  She met SIRS criteria on presentation was found to have an E. coli UTI, treated with empiric antibiotics and IV fluids.  She was in atrial fibrillation with rate in the low 100s on admission, but became more tachycardic overnight on 8/9 requiring an IV dose of metoprolol in addition to being febrile. -Agree with current therapy per primary team -Monitor and replete electrolytes to a K >4, mag >2 -Continue to treat underlying causes of increased adrenergic tone including fever, pain, infection -Continue diltiazem 30 mg every 6 hours for now, will likely consolidate to Cardizem CD by discharge  -Discontinue home atenolol 100 mg daily in favor of diltiazem -Continue Eliquis 5 mg twice daily for stroke risk reduction.  CHA2DS2-VASc 4. -Echocardiogram complete resulted with preserved LVEF without significant valvular abnormalities.  This patient's plan of care was discussed and created with Dr. Nehemiah Massed and he is in agreement.  Signed: Tristan Schroeder , PA-C 03/01/2022, 9:50 AM [facility] Cardiology  The patient has had acute onset of atrial flutter with rapid ventricular rate and variable block likely secondary to recurrent sepsis alcohol abuse encephalopathy and being found down with electrolyte abnormalities and acute kidney injury.  I have personally reviewed the echocardiogram showing normal LV systolic function with no evidence of significant valvular heart disease EKG chest x-ray and lab work.  The patient would benefit from heart rate control at this time using a medication not metabolized  by the kidneys.  Therefore discontinuation of atenolol using diltiazem for reasonable heart rate control at 100 bpm until the patient has improvements significant new onset of issues listed above.  With continuation of anticoagulation for further risk reduction for stroke with atrial fibrillation and for flutter  The patient has been interviewed and examined. I agree with assessment and plan above. Serafina Royals MD [facility]

## 2022-03-01 NOTE — Assessment & Plan Note (Signed)
Given that patient does not meet SIRS criteria and tachycardia and tachypnea could be secondary to atrial fibrillation and volume depletion, sepsis ruled out.  Urine cultures growing pansensitive E. Coli. Patient received ceftriaxone for 3 days. -Switch antibiotics to Keflex to complete a 5-day course

## 2022-03-01 NOTE — Assessment & Plan Note (Signed)
Resolved. Unclear etiology.  Possible transient viral gastroenteritis?  Stool cultures here negative.  Patient with no further diarrhea.

## 2022-03-01 NOTE — Assessment & Plan Note (Signed)
Hyponatremia resolved.  Potassium still low at 3.3 with magnesium of 1.7. -Replete magnesium and potassium. -Goal potassium as above 4 and magnesium above 2 for concern of cardiac arrhythmia.

## 2022-03-01 NOTE — Progress Notes (Addendum)
Progress Note   Patient: Krystal [name] MWU:[id number] DOB: 01-06-1951 DOA: 02/26/2022     2 DOS: the patient was seen and examined on 03/01/2022   Brief hospital course: 71 year old female with past medical history of morbid obesity, hypertension and sleep apnea and atrial fibrillation brought in the emergency room on 8/7 after being found down by her husband.  Patient's husband had been away on a trip for several days and upon return, found patient on floor with diarrhea found in various places around the house.  Patient had last spoke to his wife on the morning of 8/5 and at that time, was okay.  In the emergency room, patient found to be confused as well as in atrial fibrillation with rapid ventricular rate and lab work noteworthy for acute kidney injury, rhabdomyolysis and possibly sepsis from urinary tract infection.  Patient was admitted to the hospitalist service and started on IV fluids and antibiotics for sepsis secondary to urinary tract infection.  By following day, patient much more awake and alert.  Blood pressures and renal function improved.  Urine culture and urine drug screen pending.  Stool cultures negative.  8/9: Patient seems improving, heart rate intermittently goes in low 100s.  Urine culture with E. Coli.  Complaining of some shakiness with history of daily alcohol use.  Placed on CIWA.  8/10: Heart rate remained mostly elevated, received metoprolol overnight.  Cardiology was consulted.  Concern of atrial flutter with variable conduction. Urine culture with pansensitive E. Coli.  Antibiotics switched with Keflex.   Assessment and Plan: * Atrial fibrillation with RVR (HCC) Initial presentation of A-fib RVR with heart rates in the 120s. Post hydration heart rate has improved somewhat and rate is about 88.  Still having episodes of heart rate above 100 . She was started on Cardizem and Eliquis -Continue Cardizem and Eliquis.  Urinary tract infection Given that patient does  not meet SIRS criteria and tachycardia and tachypnea could be secondary to atrial fibrillation and volume depletion, sepsis ruled out.  Urine cultures growing pansensitive E. Coli. Patient received ceftriaxone for 3 days. -Switch antibiotics to Keflex to complete a 5-day course  AMS (altered mental status)-resolved as of 02/27/2022 Secondary to sepsis and dehydration.  MRI negative for CVA.  Currently she is alert and oriented x3.  According to husband, she appears to be close to 90% to baseline.  Patient has no memory of what happened.  She states on Saturday 8/5 morning, she was in a rush to get to the [employer] and had forgotten to take her morning medications, but had plan to after she got home.  She does not remember much after that until she woke up today.  Diarrhea Resolved. Unclear etiology.  Possible transient viral gastroenteritis?  Stool cultures here negative.  Patient with no further diarrhea.   Acute kidney injury superimposed on Stage 3B chronic kidney disease ([place]) Acute kidney injury secondary to diarrhea and decreased perfusion with A-fib.  Renal functions improving but still above baseline, creatinine at 1.30 today with baseline around 1  -Encourage p.o. hydration -Avoid nephrotoxins  Electrolyte abnormality Hyponatremia resolved.  Potassium still low at 3.3 with magnesium of 1.7. -Replete magnesium and potassium. -Goal potassium as above 4 and magnesium above 2 for concern of cardiac arrhythmia.  Alcohol abuse CIWA score of 4.  Patient appears much calmer today. -Continue with CIWA protocol  Abnormal LFTs Alcohol level normal, but she admits drinking daily. Can be due to chronic alcohol use or shock liver -  Continue to monitor   Hypertension Blood pressure currently within goal. -Currently being managed with Cardizem -Keep holding home meds  Lactic acidosis Secondary to intravascular volume depletion from diarrhea.  No evidence of sepsis, sepsis ruled out.   Patient has received aggressive IV fluid hydration and repeat lactic resolved.   Gastroesophageal reflux disease - Continue PPI  Morbid obesity ([place]) Meets criteria with BMI greater than 35 and comorbidity of hypertension.  Rhabdomyolysis-resolved as of 02/27/2022 Traumatic rhabdo from fall.  Mild Continue with IV fluid hydration.   Subjective: Patient was seen and examined today.  No new complaints.  She thinks that she is improving.  Patient uses CPAP at home which was reordered here.  Physical Exam: Vitals:   03/01/22 0401 03/01/22 0454 03/01/22 0750 03/01/22 1124  BP: (!) 104/50  110/60 135/67  Pulse: 88  97 91  Resp: '19  17 20  '$ Temp: (!) 97.4 F (36.3 C)  98 F (36.7 C) 99 F (37.2 C)  TempSrc: Oral  Oral Oral  SpO2: 97%  94% 98%  Weight:  101.7 kg    Height:       General.  Obese lady, in no acute distress. Pulmonary.  Lungs clear bilaterally, normal respiratory effort. CV.  Irregularly irregular Abdomen.  Soft, nontender, nondistended, BS positive. CNS.  Alert and oriented .  No focal neurologic deficit. Extremities.  No edema, no cyanosis, pulses intact and symmetrical. Psychiatry.  Judgment and insight appears normal.  Data Reviewed: Prior data reviewed  Family Communication: Discussed with patient  Disposition: Status is: Inpatient Remains inpatient appropriate because: Severity of illness   Planned Discharge Destination: Home  DVT prophylaxis.  Eliquis Time spent: 42 minutes  This record has been created using Systems analyst. Errors have been sought and corrected,but may not always be located. Such creation errors do not reflect on the standard of care.  Author: Lorella Nimrod, MD 03/01/2022 2:08 PM  For on call review [email or web address].

## 2022-03-01 NOTE — Assessment & Plan Note (Signed)
Secondary to intravascular volume depletion from diarrhea.  No evidence of sepsis, sepsis ruled out.  Patient has received aggressive IV fluid hydration and repeat lactic resolved.

## 2022-03-01 NOTE — Plan of Care (Signed)

## 2022-03-02 DIAGNOSIS — I4891 Unspecified atrial fibrillation: Secondary | ICD-10-CM | POA: Diagnosis not present

## 2022-03-02 LAB — CBC
HCT: 36.1 % (ref 36.0–46.0)
Hemoglobin: 12.5 g/dL (ref 12.0–15.0)
MCH: 33.8 pg (ref 26.0–34.0)
MCHC: 34.6 g/dL (ref 30.0–36.0)
MCV: 97.6 fL (ref 80.0–100.0)
Platelets: 140 10*3/uL — ABNORMAL LOW (ref 150–400)
RBC: 3.7 MIL/uL — ABNORMAL LOW (ref 3.87–5.11)
RDW: 14.7 % (ref 11.5–15.5)
WBC: 7.9 10*3/uL (ref 4.0–10.5)
nRBC: 0 % (ref 0.0–0.2)

## 2022-03-02 LAB — BASIC METABOLIC PANEL
Anion gap: 7 (ref 5–15)
BUN: 13 mg/dL (ref 8–23)
CO2: 22 mmol/L (ref 22–32)
Calcium: 9 mg/dL (ref 8.9–10.3)
Chloride: 108 mmol/L (ref 98–111)
Creatinine, Ser: 1.11 mg/dL — ABNORMAL HIGH (ref 0.44–1.00)
GFR, Estimated: 54 mL/min — ABNORMAL LOW (ref >60)
Glucose, Bld: 126 mg/dL — ABNORMAL HIGH (ref 70–99)
Potassium: 3.6 mmol/L (ref 3.5–5.1)
Sodium: 137 mmol/L (ref 135–145)

## 2022-03-02 LAB — MAGNESIUM: Magnesium: 1.6 mg/dL — ABNORMAL LOW (ref 1.7–2.4)

## 2022-03-02 MED ORDER — DILTIAZEM HCL ER COATED BEADS 120 MG PO CP24: 120.0000 mg | ORAL_CAPSULE | Freq: Once | ORAL | Status: DC

## 2022-03-02 MED ORDER — FOLIC ACID 1 MG PO TABS: 1.0000 mg | ORAL_TABLET | Freq: Every day | ORAL | 0 refills | Status: DC

## 2022-03-02 MED ORDER — LISINOPRIL-HYDROCHLOROTHIAZIDE 20-25 MG PO TABS: 1.0000 | ORAL_TABLET | ORAL | Status: DC

## 2022-03-02 MED ORDER — POTASSIUM CHLORIDE CRYS ER 20 MEQ PO TBCR
40.0000 meq | EXTENDED_RELEASE_TABLET | Freq: Two times a day (BID) | ORAL | Status: DC
  Administered 2022-03-02: 40 meq via ORAL
  Filled 2022-03-02: qty 2

## 2022-03-02 MED ORDER — DILTIAZEM HCL ER COATED BEADS 240 MG PO CP24: 240.0000 mg | ORAL_CAPSULE | Freq: Every day | ORAL | 1 refills | Status: DC

## 2022-03-02 MED ORDER — MAGNESIUM SULFATE 4 GM/100ML IV SOLN
4.0000 g | Freq: Once | INTRAVENOUS | Status: AC
  Administered 2022-03-02: 4 g via INTRAVENOUS
  Filled 2022-03-02: qty 100

## 2022-03-02 MED ORDER — DILTIAZEM HCL ER COATED BEADS 120 MG PO CP24: 240.0000 mg | ORAL_CAPSULE | Freq: Every day | ORAL | Status: DC

## 2022-03-02 MED ORDER — APIXABAN 5 MG PO TABS: 5.0000 mg | ORAL_TABLET | Freq: Two times a day (BID) | ORAL | 2 refills | Status: DC

## 2022-03-02 MED ORDER — CEPHALEXIN 500 MG PO CAPS: 500.0000 mg | ORAL_CAPSULE | Freq: Two times a day (BID) | ORAL | 0 refills | Status: AC

## 2022-03-02 NOTE — Discharge Summary (Signed)
Physician Discharge Summary   Patient: Krystal [name] MRN: [id number] DOB: Dec 18, 1950  Admit date:     02/26/2022  Discharge date: 03/02/22  Discharge Physician: Lorella Nimrod   PCP: [name], MD   Recommendations at discharge:  Please obtain CBC and BMP in 1 week Please review her medications as favor holding most of her home antihypertensives as she is being started on Cardizem for rate control. Please assess for the need of DCCV, her cardiologist should be able to assist. Follow-up with primary care provider and cardiology in 1 to 2 weeks.  Discharge Diagnoses: Principal Problem:   Atrial fibrillation with RVR (HCC) Active Problems:   Urinary tract infection   Diarrhea   Acute kidney injury superimposed on Stage 3B chronic kidney disease (HCC)   Electrolyte abnormality   Alcohol abuse   Abnormal LFTs   Hypertension   Lactic acidosis   Gastroesophageal reflux disease   Morbid obesity ([place])  Resolved Problems:   AMS (altered mental status)   Rhabdomyolysis  Hospital Course: 71 year old female with past medical history of morbid obesity, hypertension and sleep apnea and atrial fibrillation brought in the emergency room on 8/7 after being found down by her husband.  Patient's husband had been away on a trip for several days and upon return, found patient on floor with diarrhea found in various places around the house.  Patient had last spoke to his wife on the morning of 8/5 and at that time, was okay.  In the emergency room, patient found to be confused as well as in atrial fibrillation with rapid ventricular rate and lab work noteworthy for acute kidney injury, rhabdomyolysis and possibly sepsis from urinary tract infection.  Patient was admitted to the hospitalist service and started on IV fluids and antibiotics for sepsis secondary to urinary tract infection.  By following day, patient much more awake and alert.  Blood pressures and renal function improved.  Urine culture  and urine drug screen pending.  Stool cultures negative.  8/9: Patient seems improving, heart rate intermittently goes in low 100s.  Urine culture with E. Coli.  Complaining of some shakiness with history of daily alcohol use.  Placed on CIWA.  8/10: Heart rate remained mostly elevated, received metoprolol overnight.  Cardiology was consulted.  Concern of atrial flutter with variable conduction. Urine culture with pansensitive E. Coli.  Antibiotics switched with Keflex. Patient also uses CPAP at home which was reordered.  8/11: Heart rate started improving.  Cardizem dose was increased to 60 mg every 6 hourly followed by starting long-acting at 240 mg daily from tomorrow. Magnesium remain low which is being repleted.  Patient is medically stable for discharge and was told to hold home amlodipine, atenolol and a combination pill of lisinopril and HCTZ until she sees her cardiologist and they can restart as appropriate.  She will continue the rest of her home medications and follow-up with her providers.  Assessment and Plan: * Atrial fibrillation with RVR (HCC) Initial presentation of A-fib RVR with heart rates in the 120s. Post hydration heart rate has improved somewhat and rate is about 88.  Still having episodes of heart rate above 100 . She was started on Cardizem and Eliquis -Continue Cardizem and Eliquis.  Urinary tract infection Given that patient does not meet SIRS criteria and tachycardia and tachypnea could be secondary to atrial fibrillation and volume depletion, sepsis ruled out.  Urine cultures growing pansensitive E. Coli. Patient received ceftriaxone for 3 days. -Switch antibiotics to Keflex to  complete a 5-day course  AMS (altered mental status)-resolved as of 02/27/2022 Secondary to sepsis and dehydration.  MRI negative for CVA.  Currently she is alert and oriented x3.  According to husband, she appears to be close to 90% to baseline.  Patient has no memory of what happened.   She states on Saturday 8/5 morning, she was in a rush to get to the [employer] and had forgotten to take her morning medications, but had plan to after she got home.  She does not remember much after that until she woke up today.  Diarrhea Resolved. Unclear etiology.  Possible transient viral gastroenteritis?  Stool cultures here negative.  Patient with no further diarrhea.   Acute kidney injury superimposed on Stage 3B chronic kidney disease ([place]) Acute kidney injury secondary to diarrhea and decreased perfusion with A-fib.  Renal functions improving but still above baseline, creatinine at 1.30 today with baseline around 1  -Encourage p.o. hydration -Avoid nephrotoxins  Electrolyte abnormality Hyponatremia resolved.  Potassium still low at 3.3 with magnesium of 1.7. -Replete magnesium and potassium. -Goal potassium as above 4 and magnesium above 2 for concern of cardiac arrhythmia.  Alcohol abuse CIWA score of 4.  Patient appears much calmer today. -Continue with CIWA protocol  Abnormal LFTs Alcohol level normal, but she admits drinking daily. Can be due to chronic alcohol use or shock liver -Continue to monitor   Hypertension Blood pressure currently within goal. -Currently being managed with Cardizem -Keep holding home meds  Lactic acidosis Secondary to intravascular volume depletion from diarrhea.  No evidence of sepsis, sepsis ruled out.  Patient has received aggressive IV fluid hydration and repeat lactic resolved.   Gastroesophageal reflux disease - Continue PPI  Morbid obesity ([place]) Meets criteria with BMI greater than 35 and comorbidity of hypertension.  Rhabdomyolysis-resolved as of 02/27/2022 Traumatic rhabdo from fall.  Mild Continue with IV fluid hydration.   Consultants: Cardiology Procedures performed: None Disposition: Home health Diet recommendation:  Discharge Diet Orders (From admission, onward)     Start     Ordered   03/02/22 0000  Diet -  low sodium heart healthy        03/02/22 1232            DISCHARGE MEDICATION: Allergies as of 03/02/2022       Reactions   Cephalexin Nausea Only   Diaphoresis / Sweating (intolerance)   Levaquin [levofloxacin] Other (See Comments)   Stiff joints, unable to walk.        Medication List     STOP taking these medications    amLODipine 5 MG tablet Commonly known as: NORVASC   atenolol 100 MG tablet Commonly known as: TENORMIN   methocarbamol 750 MG tablet Commonly known as: ROBAXIN   oxyCODONE 5 MG immediate release tablet Commonly known as: Oxy IR/ROXICODONE   traMADol 50 MG tablet Commonly known as: ULTRAM       TAKE these medications    alendronate 70 MG tablet Commonly known as: FOSAMAX Take 70 mg by mouth every Saturday. Take with a full glass of water on an empty stomach.   apixaban 5 MG Tabs tablet Commonly known as: ELIQUIS Take 1 tablet (5 mg total) by mouth 2 (two) times daily.   carisoprodol 350 MG tablet Commonly known as: SOMA Take 350 mg by mouth in the morning and at bedtime.   cephALEXin 500 MG capsule Commonly known as: KEFLEX Take 1 capsule (500 mg total) by mouth every 12 (twelve) hours  for 2 days.   cetirizine 10 MG tablet Commonly known as: ZYRTEC Take 10 mg by mouth daily.   cholecalciferol 25 MCG (1000 UNIT) tablet Commonly known as: VITAMIN D3 Take 1,000 Units by mouth every evening.   COSAMIN DS PO Take 1 tablet by mouth 3 (three) times a week.   diltiazem 240 MG 24 hr capsule Commonly known as: CARDIZEM CD Take 1 capsule (240 mg total) by mouth daily. Start taking on: March 03, 2022   fluticasone 50 MCG/ACT nasal spray Commonly known as: FLONASE Place 2 sprays into both nostrils daily. What changed: Another medication with the same name was removed. Continue taking this medication, and follow the directions you see here.   folic acid 1 MG tablet Commonly known as: FOLVITE Take 1 tablet (1 mg total) by mouth  daily. Start taking on: March 03, 2022   furosemide 20 MG tablet Commonly known as: LASIX Take 20 mg by mouth 2 (two) times daily.   lisinopril-hydrochlorothiazide 20-25 MG tablet Commonly known as: ZESTORETIC Take 1 tablet by mouth every other day. Please hold until you see your cardiologist What changed: additional instructions   LYSINE ACETATE PO Take 4,000 mg by mouth daily as needed (cold sores).   magnesium oxide 400 MG tablet Commonly known as: MAG-OX Take 400 mg by mouth daily as needed (leg cramps).   omeprazole 20 MG capsule Commonly known as: PRILOSEC Take 20 mg by mouth daily.   oxybutynin 10 MG 24 hr tablet Commonly known as: DITROPAN-XL Take 1 tablet (10 mg total) by mouth at bedtime.   potassium chloride 10 MEQ tablet Commonly known as: KLOR-CON M Take 10 mEq by mouth in the morning and at bedtime.   pregabalin 300 MG capsule Commonly known as: LYRICA Take 300 mg by mouth 2 (two) times daily.   rOPINIRole 1 MG tablet Commonly known as: REQUIP Take 1 mg by mouth at bedtime.   triamcinolone 0.025 % cream Commonly known as: KENALOG APPLY 1 APPLICATION TOPICALLY DAILY AS NEEDED (RASH).   zolpidem 10 MG tablet Commonly known as: AMBIEN Take 10 mg by mouth at bedtime.        Follow-up Information     Yolonda Kida, MD. Go in 1 week(s).   Specialties: Cardiology, Internal Medicine Contact information: [place] [address] [phone]         [name], MD. Schedule an appointment as soon as possible for a visit in 1 week(s).   Specialty: Family Medicine Contact information: [place] [address] [phone]                Discharge Exam: Danley Danker Weights   02/28/22 0452 03/01/22 0454 03/02/22 0500  Weight: 100.5 kg 101.7 kg 101.3 kg   General.  Obese lady, in no acute distress. Pulmonary.  Lungs clear bilaterally, normal respiratory effort. CV.  Irregularly irregular Abdomen.   Soft, nontender, nondistended, BS positive. CNS.  Alert and oriented .  No focal neurologic deficit. Extremities.  No edema, no cyanosis, pulses intact and symmetrical. Psychiatry.  Judgment and insight appears normal.   Condition at discharge: stable  The results of significant diagnostics from this hospitalization (including imaging, microbiology, ancillary and laboratory) are listed below for reference.   Imaging Studies: ECHOCARDIOGRAM COMPLETE BUBBLE STUDY  Result Date: 02/28/2022    ECHOCARDIOGRAM REPORT   Patient Name:   Krystal [name] Date of Exam: 02/27/2022 Medical Rec #:  [id number]  Height:       63.0 in Accession #:    [id number]      Weight:       209.4 lb Date of Birth:  1950-09-16        BSA:          1.972 m Patient Age:    71 years        BP:           Not listed in chart/Not listed in                                               chart mmHg Patient Gender: F               HR:           145 bpm. Exam Location:  [facility] Procedure: 2D Echo, Cardiac Doppler, Color Doppler and Saline Contrast Bubble            Study Indications:     Syncope 780.2 / R55  History:         Patient has no prior history of Echocardiogram examinations.                  Risk Factors:Hypertension and Dyslipidemia.  Sonographer:     Sherrie Sport Referring Phys:  [place] Diagnosing Phys: Serafina Royals MD IMPRESSIONS  1. Left ventricular ejection fraction, by estimation, is 65 to 70%. The left ventricle has normal function. The left ventricle has no regional wall motion abnormalities. Left ventricular diastolic parameters were normal.  2. Right ventricular systolic function is normal. The right ventricular size is normal.  3. The mitral valve is normal in structure. Mild to moderate mitral valve regurgitation.  4. Tricuspid valve regurgitation is mild to moderate.  5. The aortic valve is normal in structure. Aortic valve regurgitation is not visualized. FINDINGS  Left Ventricle: Left ventricular ejection  fraction, by estimation, is 65 to 70%. The left ventricle has normal function. The left ventricle has no regional wall motion abnormalities. The left ventricular internal cavity size was normal in size. There is  no left ventricular hypertrophy. Left ventricular diastolic parameters were normal. Right Ventricle: The right ventricular size is normal. No increase in right ventricular wall thickness. Right ventricular systolic function is normal. Left Atrium: Left atrial size was normal in size. Right Atrium: Right atrial size was normal in size. Pericardium: There is no evidence of pericardial effusion. Mitral Valve: The mitral valve is normal in structure. Mild to moderate mitral valve regurgitation. Tricuspid Valve: The tricuspid valve is normal in structure. Tricuspid valve regurgitation is mild to moderate. Aortic Valve: The aortic valve is normal in structure. Aortic valve regurgitation is not visualized. Aortic valve mean gradient measures 1.5 mmHg. Aortic valve peak gradient measures 2.5 mmHg. Aortic valve area, by VTI measures 3.19 cm. Pulmonic Valve: The pulmonic valve was normal in structure. Pulmonic valve regurgitation is trivial. Aorta: The aortic root and ascending aorta are structurally normal, with no evidence of dilitation. IAS/Shunts: No atrial level shunt detected by color flow Doppler. Agitated saline contrast was given intravenously to evaluate for intracardiac shunting.  LEFT VENTRICLE PLAX 2D LVIDd:         5.40 cm LVIDs:         3.40 cm LV PW:         1.10  cm LV IVS:        1.00 cm LVOT diam:     2.10 cm LV SV:         40 LV SV Index:   20 LVOT Area:     3.46 cm  RIGHT VENTRICLE RV Basal diam:  3.60 cm RV S prime:     10.40 cm/s TAPSE (M-mode): 1.8 cm LEFT ATRIUM             Index        RIGHT ATRIUM           Index LA diam:        4.00 cm 2.03 cm/m   RA Area:     19.30 cm LA Vol (A2C):   46.0 ml 23.33 ml/m  RA Volume:   56.30 ml  28.55 ml/m LA Vol (A4C):   32.2 ml 16.33 ml/m LA Biplane  Vol: 42.3 ml 21.45 ml/m  AORTIC VALVE AV Area (Vmax):    3.01 cm AV Area (Vmean):   2.79 cm AV Area (VTI):     3.19 cm AV Vmax:           78.90 cm/s AV Vmean:          56.650 cm/s AV VTI:            0.125 m AV Peak Grad:      2.5 mmHg AV Mean Grad:      1.5 mmHg LVOT Vmax:         68.50 cm/s LVOT Vmean:        45.600 cm/s LVOT VTI:          0.115 m LVOT/AV VTI ratio: 0.92  AORTA Ao Root diam: 3.70 cm MITRAL VALVE                TRICUSPID VALVE MV Area (PHT): 3.91 cm     TR Peak grad:   24.4 mmHg MV Decel Time: 194 msec     TR Vmax:        247.00 cm/s MV E velocity: 103.00 cm/s                             SHUNTS                             Systemic VTI:  0.12 m                             Systemic Diam: 2.10 cm Serafina Royals MD Electronically signed by Serafina Royals MD Signature Date/Time: 02/28/2022/12:49:28 PM    Final    US Carotid Bilateral  Result Date: 02/27/2022 CLINICAL DATA:  [id number]. Stroke symptoms but with negative contemporaneous MRI. EXAM: BILATERAL CAROTID DUPLEX ULTRASOUND TECHNIQUE: Pearline Cables scale imaging, color Doppler and duplex ultrasound were performed of bilateral carotid and vertebral arteries in the neck. COMPARISON:  None Available. FINDINGS: Criteria: Quantification of carotid stenosis is based on velocity parameters that correlate the residual internal carotid diameter with NASCET-based stenosis levels, using the diameter of the distal internal carotid lumen as the denominator for stenosis measurement. The following velocity measurements were obtained: RIGHT ICA: 64 cm/sec CCA: 97.9 cm/sec SYSTOLIC ICA/CCA RATIO:  1.0 ECA: 80 cm/sec LEFT ICA: 80 cm/sec CCA: 89.2 cm/sec SYSTOLIC ICA/CCA RATIO:  1.3 ECA: 62 cm/sec RIGHT CAROTID ARTERY: There is a small amount of  nonstenosing echogenic calcific plaque in the carotid bulb and cervical ICA origin. RIGHT VERTEBRAL ARTERY: There is antegrade flow with PSV 35.4 centimeters/second LEFT CAROTID ARTERY: Small amount of nonstenosing heterogeneous  mixed plaque is seen in the carotid bulb and cervical ICA origin LEFT VERTEBRAL ARTERY: There is antegrade flow with PSV 35.1 centimeters/second Upper extremity blood pressures: RIGHT: Not measured. LEFT: Not measured. IMPRESSION: No Doppler evidence of hemodynamically significant (greater than 50%) carotid stenosis, with small amounts of nonstenosing plaque in the carotid bulbs and proximal cervical ICAs, and bilateral antegrade vertebral artery flow. Electronically Signed   By: Telford Nab M.D.   On: 02/27/2022 05:15   MR BRAIN WO CONTRAST  Result Date: 02/27/2022 CLINICAL DATA:  Acute neurologic deficit EXAM: MRI HEAD WITHOUT CONTRAST TECHNIQUE: Multiplanar, multiecho pulse sequences of the brain and surrounding structures were obtained without intravenous contrast. COMPARISON:  None Available. FINDINGS: Brain: No acute infarct, mass effect or extra-axial collection. No acute or chronic hemorrhage. There is multifocal hyperintense T2-weighted signal within the white matter. Generalized volume loss. The midline structures are normal. Vascular: Major flow voids are preserved. Skull and upper cervical spine: Normal calvarium and skull base. Visualized upper cervical spine and soft tissues are normal. Sinuses/Orbits:No paranasal sinus fluid levels or advanced mucosal thickening. No mastoid or middle ear effusion. Normal orbits. IMPRESSION: 1. No acute intracranial abnormality. 2. Findings of chronic small vessel ischemia and volume loss. Electronically Signed   By: Ulyses Jarred M.D.   On: 02/27/2022 03:17   DG Chest Portable 1 View  Result Date: 02/26/2022 CLINICAL DATA:  Shortness of breath.  Weakness. EXAM: PORTABLE CHEST 1 VIEW COMPARISON:  04/15/2020 FINDINGS: Upper normal heart size.The cardiomediastinal contours are normal. The lungs are clear. Pulmonary vasculature is normal. No consolidation, pleural effusion, or pneumothorax. No acute osseous abnormalities are seen. Her shoulder arthropathy.  Surgical clips in the left axilla. IMPRESSION: No acute chest findings. Electronically Signed   By: Keith Rake M.D.   On: 02/26/2022 22:39   CT HEAD WO CONTRAST (5MM)  Result Date: 02/26/2022 CLINICAL DATA:  Mental status change, unknown cause, unwitnessed fall EXAM: CT HEAD WITHOUT CONTRAST TECHNIQUE: Contiguous axial images were obtained from the base of the skull through the vertex without intravenous contrast. RADIATION DOSE REDUCTION: This exam was performed according to the departmental dose-optimization program which includes automated exposure control, adjustment of the mA and/or kV according to patient size and/or use of iterative reconstruction technique. COMPARISON:  None Available. FINDINGS: Brain: No acute intracranial abnormality. Specifically, no hemorrhage, hydrocephalus, mass lesion, acute infarction, or significant intracranial injury. Vascular: No hyperdense vessel or unexpected calcification. Skull: No acute calvarial abnormality. Sinuses/Orbits: No acute findings Other: None IMPRESSION: No acute intracranial abnormality. Electronically Signed   By: Rolm Baptise M.D.   On: 02/26/2022 21:21   DG Knee Complete 4 Views Right  Result Date: 02/26/2022 CLINICAL DATA:  Fall, pain EXAM: RIGHT KNEE - COMPLETE 4+ VIEW COMPARISON:  08/12/2018 FINDINGS: Prior right knee replacement. No acute bony abnormality. Specifically, no fracture, subluxation, or dislocation. No hardware complicating feature. No joint effusion. Soft tissues are intact. IMPRESSION: No acute bony abnormality. Electronically Signed   By: Rolm Baptise M.D.   On: 02/26/2022 21:16   DG Knee Complete 4 Views Left  Result Date: 02/26/2022 CLINICAL DATA:  Weakness, fall EXAM: LEFT KNEE - COMPLETE 4+ VIEW COMPARISON:  09/22/2019 FINDINGS: Prior left knee replacement. No hardware complicating feature. Insert bones small joint effusion. IMPRESSION: No acute bony abnormality.  Small joint  effusion. Electronically Signed   By: Rolm Baptise M.D.   On: 02/26/2022 21:15    Microbiology: Results for orders placed or performed during the hospital encounter of 02/26/22  Gastrointestinal Panel by PCR , Stool     Status: None   Collection Time: 02/26/22  9:22 AM   Specimen: Stool  Result Value Ref Range Status   Campylobacter species NOT DETECTED NOT DETECTED Final   Plesimonas shigelloides NOT DETECTED NOT DETECTED Final   Salmonella species NOT DETECTED NOT DETECTED Final   Yersinia enterocolitica NOT DETECTED NOT DETECTED Final   Vibrio species NOT DETECTED NOT DETECTED Final   Vibrio cholerae NOT DETECTED NOT DETECTED Final   Enteroaggregative E coli (EAEC) NOT DETECTED NOT DETECTED Final   Enteropathogenic E coli (EPEC) NOT DETECTED NOT DETECTED Final   Enterotoxigenic E coli (ETEC) NOT DETECTED NOT DETECTED Final   Shiga like toxin producing E coli (STEC) NOT DETECTED NOT DETECTED Final   Shigella/Enteroinvasive E coli (EIEC) NOT DETECTED NOT DETECTED Final   Cryptosporidium NOT DETECTED NOT DETECTED Final   Cyclospora cayetanensis NOT DETECTED NOT DETECTED Final   Entamoeba histolytica NOT DETECTED NOT DETECTED Final   Giardia lamblia NOT DETECTED NOT DETECTED Final   Adenovirus F40/41 NOT DETECTED NOT DETECTED Final   Astrovirus NOT DETECTED NOT DETECTED Final   Norovirus GI/GII NOT DETECTED NOT DETECTED Final   Rotavirus A NOT DETECTED NOT DETECTED Final   Sapovirus (I, II, IV, and V) NOT DETECTED NOT DETECTED Final    Comment: Performed at [facility], [address] [place], [place] [address]  Blood culture (routine x 2)     Status: None (Preliminary result)   Collection Time: 02/26/22  8:40 PM   Specimen: BLOOD  Result Value Ref Range Status   Specimen Description BLOOD BLOOD RIGHT FOREARM  Final   Special Requests   Final    BOTTLES DRAWN AEROBIC AND ANAEROBIC Blood Culture results may not be optimal due to an excessive volume of blood received in culture bottles   Culture   Final    NO  GROWTH 4 DAYS Performed at [facility], [address] [place], [place] [address]    Report Status PENDING  Incomplete  Blood culture (routine x 2)     Status: None (Preliminary result)   Collection Time: 02/26/22  8:45 PM   Specimen: BLOOD  Result Value Ref Range Status   Specimen Description BLOOD RIGHT ANTECUBITAL  Final   Special Requests   Final    BOTTLES DRAWN AEROBIC AND ANAEROBIC Blood Culture results may not be optimal due to an excessive volume of blood received in culture bottles   Culture   Final    NO GROWTH 4 DAYS Performed at [facility], [address] [place], [place] [address]    Report Status PENDING  Incomplete  Resp Panel by RT-PCR (Flu A&B, Covid) Anterior Nasal Swab     Status: None   Collection Time: 02/27/22 12:09 AM   Specimen: Anterior Nasal Swab  Result Value Ref Range Status   SARS Coronavirus 2 by RT PCR NEGATIVE NEGATIVE Final    Comment: (NOTE) SARS-CoV-2 target nucleic acids are NOT DETECTED.  The SARS-CoV-2 RNA is generally detectable in upper respiratory specimens during the acute phase of infection. The lowest concentration of SARS-CoV-2 viral copies this assay can detect is 138 copies/mL. A negative result does not preclude SARS-Cov-2 infection and should not be used as the sole basis for treatment or  other patient management decisions. A negative result may occur with  improper specimen collection/handling, submission of specimen other than nasopharyngeal swab, presence of viral mutation(s) within the areas targeted by this assay, and inadequate number of viral copies(<138 copies/mL). A negative result must be combined with clinical observations, patient history, and epidemiological information. The expected result is Negative.  Fact Sheet for Patients:  EntrepreneurPulse.com.au  Fact Sheet for Healthcare Providers:  IncredibleEmployment.be  This test is no t yet approved or  cleared by the [place] FDA and  has been authorized for detection and/or diagnosis of SARS-CoV-2 by FDA under an Emergency Use Authorization (EUA). This EUA will remain  in effect (meaning this test can be used) for the duration of the COVID-19 declaration under Section 564(b)(1) of the Act, 21 U.S.C.section 360bbb-3(b)(1), unless the authorization is terminated  or revoked sooner.       Influenza A by PCR NEGATIVE NEGATIVE Final   Influenza B by PCR NEGATIVE NEGATIVE Final    Comment: (NOTE) The Xpert Xpress SARS-CoV-2/FLU/RSV plus assay is intended as an aid in the diagnosis of influenza from Nasopharyngeal swab specimens and should not be used as a sole basis for treatment. Nasal washings and aspirates are unacceptable for Xpert Xpress SARS-CoV-2/FLU/RSV testing.  Fact Sheet for Patients: EntrepreneurPulse.com.au  Fact Sheet for Healthcare Providers: IncredibleEmployment.be  This test is not yet approved or cleared by the [place] FDA and has been authorized for detection and/or diagnosis of SARS-CoV-2 by FDA under an Emergency Use Authorization (EUA). This EUA will remain in effect (meaning this test can be used) for the duration of the COVID-19 declaration under Section 564(b)(1) of the Act, 21 U.S.C. section 360bbb-3(b)(1), unless the authorization is terminated or revoked.  Performed at [facility], [address] [place], [place] [address]   Urine Culture     Status: Abnormal   Collection Time: 02/27/22  1:06 AM   Specimen: Urine, Clean Catch  Result Value Ref Range Status   Specimen Description   Final    URINE, CLEAN CATCH Performed at [facility], [address] [place], [place] [id number]    Special Requests   Final    NONE Performed at [facility], [place], [place] [address]    Culture >=100,000 COLONIES/mL ESCHERICHIA COLI (A)  Final   Report Status 03/01/2022  FINAL  Final   Organism ID, Bacteria ESCHERICHIA COLI (A)  Final      Susceptibility   Escherichia coli - MIC*    AMPICILLIN <=2 SENSITIVE Sensitive     CEFAZOLIN <=4 SENSITIVE Sensitive     CEFEPIME <=0.12 SENSITIVE Sensitive     CEFTRIAXONE <=0.25 SENSITIVE Sensitive     CIPROFLOXACIN <=0.25 SENSITIVE Sensitive     GENTAMICIN <=1 SENSITIVE Sensitive     IMIPENEM <=0.25 SENSITIVE Sensitive     NITROFURANTOIN <=16 SENSITIVE Sensitive     TRIMETH/SULFA <=20 SENSITIVE Sensitive     AMPICILLIN/SULBACTAM <=2 SENSITIVE Sensitive     PIP/TAZO <=4 SENSITIVE Sensitive     * >=100,000 COLONIES/mL ESCHERICHIA COLI  MRSA Next Gen by PCR, Nasal     Status: None   Collection Time: 02/27/22  5:38 AM   Specimen: Nasal Mucosa; Nasal Swab  Result Value Ref Range Status   MRSA by PCR Next Gen NOT DETECTED NOT DETECTED Final    Comment: (NOTE) The GeneXpert MRSA Assay (FDA approved for NASAL specimens only), is one component of a comprehensive MRSA colonization surveillance program. It is not  intended to diagnose MRSA infection nor to guide or monitor treatment for MRSA infections. Test performance is not FDA approved in patients less than 84 years old. Performed at [facility], [address] [place], [place] [address]     Labs: CBC: Recent Labs  Lab 02/26/22 2054 02/27/22 0106 03/01/22 1013 03/02/22 0626  WBC 7.6 5.5 8.5 7.9  HGB 14.5 11.1* 12.8 12.5  HCT 41.1 32.0* 36.4 36.1  MCV 98.6 100.6* 97.8 97.6  PLT 101* 88* 95* 979*   Basic Metabolic Panel: Recent Labs  Lab 02/26/22 2054 02/26/22 2234 02/27/22 0621 02/28/22 0446 03/01/22 1013 03/02/22 0626  NA 126*  --  132* 132* 136 137  K 3.1*  --  3.3* 3.4* 3.3* 3.6  CL 87*  --  98 101 107 108  CO2 26  --  21* 24 20* 22  GLUCOSE 116*  --  83 121* 163* 126*  BUN 54*  --  53* 39* 22 13  CREATININE 2.84*  --  2.32* 1.85* 1.30* 1.11*  CALCIUM 7.9*  --  7.7* 7.7* 8.4* 9.0  MG  --  2.1  --   --  1.7 1.6*   Liver  Function Tests: Recent Labs  Lab 02/26/22 2054 02/27/22 0621  AST 72* 76*  ALT 30 30  ALKPHOS 67 71  BILITOT 2.0* 1.6*  PROT 6.7 7.1  ALBUMIN 3.0* 3.2*   CBG: Recent Labs  Lab 02/27/22 0533  GLUCAP 75    Discharge time spent: greater than 30 minutes.  This record has been created using Systems analyst. Errors have been sought and corrected,but may not always be located. Such creation errors do not reflect on the standard of care.   Signed: Lorella Nimrod, MD [facility] 03/02/2022

## 2022-03-02 NOTE — Progress Notes (Addendum)
[place] NOTE       Patient ID: Krystal [name] MRN: [id number] DOB/AGE: March 29, 1951 71 y.o.  Admit date: 02/26/2022 Referring Physician Dr. Reesa Chew  Primary Physician Dr. [name]  Primary Cardiologist Dr. Clayborn Bigness Reason for Consultation AF RVR  HPI: Krystal [name] is a 32yoF with PMH of hypertension, OSA, morbid obesity, paroxysmal atrial fibrillation, type 2 diabetes, significant alcohol use, fibromyalgia, depression who presented to [facility] ED 02/26/2022 after being found down by her husband.  The patient's husband had been away on a trip for several days and found the patient on the floor with diarrhea in various places around her home.  Initially in the ED the patient was encephalopathic, was in atrial fibrillation with RVR, febrile, with a significant AKI.  Urine culture grew E. coli she has been treated with empiric antibiotics.  Cardiology is consulted on hospital day 3 due to periods of A-fib with RVR.  Interval History: -No acute events -Heart rate better controlled with increase of diltiazem to 60 mg every 6 -Continues to feel better today, no chest pain, shortness of breath  Review of systems complete and found to be negative unless listed above     Past Medical History:  Diagnosis Date   Anemia    Arthritis    osteo   Blindness of right eye at birth   Cancer [facility]) 2011   Left breast 2011 and cervical age 67   Carpal tunnel syndrome    Cataract    Depression    Edema 07/27/2011   Fibromyalgia    muscle weakness and pain   GERD (gastroesophageal reflux disease)    Hyperlipidemia    Hypertension    benign   Lymphedema    Osteoporosis    Plantar fasciitis    Pre-diabetes    Raynaud's disease    Restless leg syndrome    Rosacea    Rotator cuff rupture    Sleep apnea    Synovitis    Ulcer     Past Surgical History:  Procedure Laterality Date   ABDOMINAL HYSTERECTOMY  1986   For bleeding and pain   BLADDER SURGERY  2006   Bladder  tack   BREAST RECONSTRUCTION Bilateral 09/17/2012   Procedure: BREAST RECONSTRUCTION;  Surgeon: Theodoro Kos, DO;  Location: [place];  Service: Plastics;  Laterality: Bilateral;  BILATERAL BREAST RECONSTRUCTION WITH TISSUE EXPANDERS AND ALLOMED   BREAST RECONSTRUCTION Right 06/03/2013   Procedure: RIGHT BREAST CAPSULE CONSTRACTURE;  Surgeon: Theodoro Kos, DO;  Location: [place];  Service: Plastics;  Laterality: Right;   CARPAL TUNNEL RELEASE Bilateral 2008   EYE SURGERY Right    INJECTION KNEE Left 08/12/2018   Procedure: KNEE INJECTION-LEFT;  Surgeon: Corky Mull, MD;  Location: [facility];  Service: Orthopedics;  Laterality: Left;   JOINT REPLACEMENT     LIPOSUCTION Bilateral 01/29/2013   Procedure: LIPOSUCTION;  Surgeon: Theodoro Kos, DO;  Location: [place];  Service: Plastics;  Laterality: Bilateral;   LIPOSUCTION Right 06/03/2013   Procedure: LIPOSUCTION;  Surgeon: Theodoro Kos, DO;  Location: [place];  Service: Plastics;  Laterality: Right;   MASTECTOMY  2012   rt prophalactic mast-snbx   MASTECTOMY MODIFIED RADICAL  2012   left-axillary nodes   NEUROMA SURGERY Bilateral    NOSE SURGERY  2007   PORT-A-CATH REMOVAL     insertion and   THROAT SURGERY     TONSILLECTOMY     TOTAL KNEE ARTHROPLASTY  Right 08/12/2018   Procedure: TOTAL KNEE ARTHROPLASTY-RIGHT;  Surgeon: Corky Mull, MD;  Location: [facility];  Service: Orthopedics;  Laterality: Right;   TOTAL KNEE ARTHROPLASTY Left 09/22/2019   Procedure: TOTAL KNEE ARTHROPLASTY;  Surgeon: Corky Mull, MD;  Location: [facility];  Service: Orthopedics;  Laterality: Left;   UVULOPALATOPHARYNGOPLASTY      Medications Prior to Admission  Medication Sig Dispense Refill Last Dose   amLODipine (NORVASC) 5 MG tablet Take 5 mg by mouth daily.   Past Week   atenolol (TENORMIN) 100 MG tablet Take 100 mg by mouth every other day.    Past Week   cetirizine (ZYRTEC) 10 MG tablet  Take 10 mg by mouth daily.   Past Week   cholecalciferol (VITAMIN D3) 25 MCG (1000 UT) tablet Take 1,000 Units by mouth every evening.    Past Week   fluticasone (FLONASE) 50 MCG/ACT nasal spray Place 2 sprays into both nostrils daily.   Past Week   furosemide (LASIX) 20 MG tablet Take 20 mg by mouth 2 (two) times daily.    Past Week   Glucosamine-Chondroitin (COSAMIN DS PO) Take 1 tablet by mouth 3 (three) times a week.   Past Week   lisinopril-hydrochlorothiazide (PRINZIDE,ZESTORETIC) 20-25 MG per tablet Take 1 tablet by mouth every other day.    Past Week   methocarbamol (ROBAXIN) 750 MG tablet Take 750 mg by mouth as needed.   Past Week   omeprazole (PRILOSEC) 20 MG capsule Take 20 mg by mouth daily.   Past Week   oxybutynin (DITROPAN-XL) 10 MG 24 hr tablet Take 1 tablet (10 mg total) by mouth at bedtime. 30 tablet 11 Past Week   potassium chloride (KLOR-CON) 10 MEQ tablet Take 10 mEq by mouth in the morning and at bedtime.   Past Week   pregabalin (LYRICA) 300 MG capsule Take 300 mg by mouth 2 (two) times daily.     Past Week   rOPINIRole (REQUIP) 1 MG tablet Take 1 mg by mouth at bedtime.    Past Week   zolpidem (AMBIEN) 10 MG tablet Take 10 mg by mouth at bedtime.   Past Week   alendronate (FOSAMAX) 70 MG tablet Take 70 mg by mouth every Saturday. Take with a full glass of water on an empty stomach.    02/17/2022   carisoprodol (SOMA) 350 MG tablet Take 350 mg by mouth in the morning and at bedtime.      fluticasone (FLONASE) 50 MCG/ACT nasal spray Place 1 spray into both nostrils daily. (Patient not taking: Reported on 02/27/2022)   Not Taking   LYSINE ACETATE PO Take 4,000 mg by mouth daily as needed (cold sores).    prn at prn   magnesium oxide (MAG-OX) 400 MG tablet Take 400 mg by mouth daily as needed (leg cramps).   prn at prn   oxyCODONE (OXY IR/ROXICODONE) 5 MG immediate release tablet Take 1-2 tablets (5-10 mg total) by mouth every 4 (four) hours as needed for moderate pain. (Patient  not taking: Reported on 02/27/2022) 60 tablet 0 Not Taking   traMADol (ULTRAM) 50 MG tablet Take 1-2 tablets (50-100 mg total) by mouth every 6 (six) hours as needed for moderate pain. (Patient not taking: Reported on 02/27/2022) 40 tablet 0 Not Taking   triamcinolone (KENALOG) 0.025 % cream APPLY 1 APPLICATION TOPICALLY DAILY AS NEEDED (RASH). 30 g 0 prn at prn   Social History   Socioeconomic History   Marital status: Married  Spouse name: Not on file   Number of children: Not on file   Years of education: Not on file   Highest education level: Not on file  Occupational History   Not on file  Tobacco Use   Smoking status: Never    Passive exposure: Never   Smokeless tobacco: Never  Vaping Use   Vaping Use: Never used  Substance and Sexual Activity   Alcohol use: Yes    Comment: 16 oz per day wine   Drug use: No   Sexual activity: Yes  Other Topics Concern   Not on file  Social History Narrative   Not on file   Social Determinants of Health   Financial Resource Strain: Not on file  Food Insecurity: Not on file  Transportation Needs: Not on file  Physical Activity: Not on file  Stress: Not on file  Social Connections: Not on file  Intimate Partner Violence: Not on file    Family History  Problem Relation Age of Onset   Cancer Mother 42       non hodgkins lymphoma   Cancer Father        neck, throat, lung   Lung cancer Father    Throat cancer Father    Hypertension Sister    Cancer Sister 32       Breast cancer dx'd 04/2011   Hypertension Brother    Breast cancer Maternal Aunt    Liver cancer Maternal Uncle    Cancer Maternal Grandmother        died 63, unknown cancer   Cancer Maternal Grandfather        died in his 46s; unknown cancer   Lung cancer Maternal Uncle    Throat cancer Maternal Uncle    Breast cancer Maternal Aunt    Cancer Cousin        died under the age 71, unknown cancer   Cancer Cousin        died age 81; unknown cancer      PHYSICAL  EXAM General: Elderly and ill-appearing Caucasian female, in no acute distress.  Laying in incline in PCU bed. HEENT:  Normocephalic and atraumatic. Neck:  No JVD.  Lungs: Normal respiratory effort on room air. Clear bilaterally to auscultation. No wheezes, crackles, rhonchi.  Heart: Irregular irregular rhythm with controlled rate. Normal S1 and S2 without gallops or murmurs. Abdomen: Non-distended appearing.  Msk: Normal strength and tone for age. Extremities: Warm and well perfused. No clubbing, cyanosis.  Trace bilateral lower extremity edema.  Neuro: Alert and oriented X 3. Psych:  Answers questions appropriately.   Labs:   Lab Results  Component Value Date   WBC 7.9 03/02/2022   HGB 12.5 03/02/2022   HCT 36.1 03/02/2022   MCV 97.6 03/02/2022   PLT 140 (L) 03/02/2022    Recent Labs  Lab 02/27/22 0621 02/28/22 0446 03/02/22 0626  NA 132*   < > 137  K 3.3*   < > 3.6  CL 98   < > 108  CO2 21*   < > 22  BUN 53*   < > 13  CREATININE 2.32*   < > 1.11*  CALCIUM 7.7*   < > 9.0  PROT 7.1  --   --   BILITOT 1.6*  --   --   ALKPHOS 71  --   --   ALT 30  --   --   AST 76*  --   --   GLUCOSE 83   < >  126*   < > = values in this interval not displayed.    Lab Results  Component Value Date   OPFYTWK 462 (H) 02/27/2022     Lab Results  Component Value Date   CHOL 197 04/03/2019   CHOL 199 04/01/2018   CHOL 174 09/28/2014   Lab Results  Component Value Date   HDL 50 04/03/2019   HDL 56 04/01/2018   HDL 38 (L) 09/28/2014   Lab Results  Component Value Date   LDLCALC 124 (H) 04/03/2019   LDLCALC 116 (H) 04/01/2018   LDLCALC 97 09/28/2014   Lab Results  Component Value Date   TRIG 116 04/03/2019   TRIG 136 04/01/2018   TRIG 194 (H) 09/28/2014   Lab Results  Component Value Date   CHOLHDL 3.9 04/03/2019   CHOLHDL 3.6 04/01/2018   CHOLHDL 4.6 09/28/2014   No results found for: "LDLDIRECT"    Radiology: ECHOCARDIOGRAM COMPLETE BUBBLE STUDY  Result Date:  02/28/2022    ECHOCARDIOGRAM REPORT   Patient Name:   Krystal [name] Date of Exam: 02/27/2022 Medical Rec #:  [id number]       Height:       63.0 in Accession #:    [id number]      Weight:       209.4 lb Date of Birth:  1951-07-16        BSA:          1.972 m Patient Age:    68 years        BP:           Not listed in chart/Not listed in                                               chart mmHg Patient Gender: F               HR:           145 bpm. Exam Location:  [facility] Procedure: 2D Echo, Cardiac Doppler, Color Doppler and Saline Contrast Bubble            Study Indications:     Syncope 780.2 / R55  History:         Patient has no prior history of Echocardiogram examinations.                  Risk Factors:Hypertension and Dyslipidemia.  Sonographer:     Sherrie Sport Referring Phys:  [place] Diagnosing Phys: Serafina Royals MD IMPRESSIONS  1. Left ventricular ejection fraction, by estimation, is 65 to 70%. The left ventricle has normal function. The left ventricle has no regional wall motion abnormalities. Left ventricular diastolic parameters were normal.  2. Right ventricular systolic function is normal. The right ventricular size is normal.  3. The mitral valve is normal in structure. Mild to moderate mitral valve regurgitation.  4. Tricuspid valve regurgitation is mild to moderate.  5. The aortic valve is normal in structure. Aortic valve regurgitation is not visualized. FINDINGS  Left Ventricle: Left ventricular ejection fraction, by estimation, is 65 to 70%. The left ventricle has normal function. The left ventricle has no regional wall motion abnormalities. The left ventricular internal cavity size was normal in size. There is  no left ventricular hypertrophy. Left ventricular diastolic parameters were normal. Right Ventricle: The right ventricular size is  normal. No increase in right ventricular wall thickness. Right ventricular systolic function is normal. Left Atrium: Left atrial size was normal in  size. Right Atrium: Right atrial size was normal in size. Pericardium: There is no evidence of pericardial effusion. Mitral Valve: The mitral valve is normal in structure. Mild to moderate mitral valve regurgitation. Tricuspid Valve: The tricuspid valve is normal in structure. Tricuspid valve regurgitation is mild to moderate. Aortic Valve: The aortic valve is normal in structure. Aortic valve regurgitation is not visualized. Aortic valve mean gradient measures 1.5 mmHg. Aortic valve peak gradient measures 2.5 mmHg. Aortic valve area, by VTI measures 3.19 cm. Pulmonic Valve: The pulmonic valve was normal in structure. Pulmonic valve regurgitation is trivial. Aorta: The aortic root and ascending aorta are structurally normal, with no evidence of dilitation. IAS/Shunts: No atrial level shunt detected by color flow Doppler. Agitated saline contrast was given intravenously to evaluate for intracardiac shunting.  LEFT VENTRICLE PLAX 2D LVIDd:         5.40 cm LVIDs:         3.40 cm LV PW:         1.10 cm LV IVS:        1.00 cm LVOT diam:     2.10 cm LV SV:         40 LV SV Index:   20 LVOT Area:     3.46 cm  RIGHT VENTRICLE RV Basal diam:  3.60 cm RV S prime:     10.40 cm/s TAPSE (M-mode): 1.8 cm LEFT ATRIUM             Index        RIGHT ATRIUM           Index LA diam:        4.00 cm 2.03 cm/m   RA Area:     19.30 cm LA Vol (A2C):   46.0 ml 23.33 ml/m  RA Volume:   56.30 ml  28.55 ml/m LA Vol (A4C):   32.2 ml 16.33 ml/m LA Biplane Vol: 42.3 ml 21.45 ml/m  AORTIC VALVE AV Area (Vmax):    3.01 cm AV Area (Vmean):   2.79 cm AV Area (VTI):     3.19 cm AV Vmax:           78.90 cm/s AV Vmean:          56.650 cm/s AV VTI:            0.125 m AV Peak Grad:      2.5 mmHg AV Mean Grad:      1.5 mmHg LVOT Vmax:         68.50 cm/s LVOT Vmean:        45.600 cm/s LVOT VTI:          0.115 m LVOT/AV VTI ratio: 0.92  AORTA Ao Root diam: 3.70 cm MITRAL VALVE                TRICUSPID VALVE MV Area (PHT): 3.91 cm     TR Peak  grad:   24.4 mmHg MV Decel Time: 194 msec     TR Vmax:        247.00 cm/s MV E velocity: 103.00 cm/s                             SHUNTS  Systemic VTI:  0.12 m                             Systemic Diam: 2.10 cm Serafina Royals MD Electronically signed by Serafina Royals MD Signature Date/Time: 02/28/2022/12:49:28 PM    Final    US Carotid Bilateral  Result Date: 02/27/2022 CLINICAL DATA:  [id number]. Stroke symptoms but with negative contemporaneous MRI. EXAM: BILATERAL CAROTID DUPLEX ULTRASOUND TECHNIQUE: Pearline Cables scale imaging, color Doppler and duplex ultrasound were performed of bilateral carotid and vertebral arteries in the neck. COMPARISON:  None Available. FINDINGS: Criteria: Quantification of carotid stenosis is based on velocity parameters that correlate the residual internal carotid diameter with NASCET-based stenosis levels, using the diameter of the distal internal carotid lumen as the denominator for stenosis measurement. The following velocity measurements were obtained: RIGHT ICA: 64 cm/sec CCA: 37.9 cm/sec SYSTOLIC ICA/CCA RATIO:  1.0 ECA: 80 cm/sec LEFT ICA: 80 cm/sec CCA: 02.4 cm/sec SYSTOLIC ICA/CCA RATIO:  1.3 ECA: 62 cm/sec RIGHT CAROTID ARTERY: There is a small amount of nonstenosing echogenic calcific plaque in the carotid bulb and cervical ICA origin. RIGHT VERTEBRAL ARTERY: There is antegrade flow with PSV 35.4 centimeters/second LEFT CAROTID ARTERY: Small amount of nonstenosing heterogeneous mixed plaque is seen in the carotid bulb and cervical ICA origin LEFT VERTEBRAL ARTERY: There is antegrade flow with PSV 35.1 centimeters/second Upper extremity blood pressures: RIGHT: Not measured. LEFT: Not measured. IMPRESSION: No Doppler evidence of hemodynamically significant (greater than 50%) carotid stenosis, with small amounts of nonstenosing plaque in the carotid bulbs and proximal cervical ICAs, and bilateral antegrade vertebral artery flow. Electronically Signed   By: Telford Nab M.D.   On: 02/27/2022 05:15   MR BRAIN WO CONTRAST  Result Date: 02/27/2022 CLINICAL DATA:  Acute neurologic deficit EXAM: MRI HEAD WITHOUT CONTRAST TECHNIQUE: Multiplanar, multiecho pulse sequences of the brain and surrounding structures were obtained without intravenous contrast. COMPARISON:  None Available. FINDINGS: Brain: No acute infarct, mass effect or extra-axial collection. No acute or chronic hemorrhage. There is multifocal hyperintense T2-weighted signal within the white matter. Generalized volume loss. The midline structures are normal. Vascular: Major flow voids are preserved. Skull and upper cervical spine: Normal calvarium and skull base. Visualized upper cervical spine and soft tissues are normal. Sinuses/Orbits:No paranasal sinus fluid levels or advanced mucosal thickening. No mastoid or middle ear effusion. Normal orbits. IMPRESSION: 1. No acute intracranial abnormality. 2. Findings of chronic small vessel ischemia and volume loss. Electronically Signed   By: Ulyses Jarred M.D.   On: 02/27/2022 03:17   DG Chest Portable 1 View  Result Date: 02/26/2022 CLINICAL DATA:  Shortness of breath.  Weakness. EXAM: PORTABLE CHEST 1 VIEW COMPARISON:  04/15/2020 FINDINGS: Upper normal heart size.The cardiomediastinal contours are normal. The lungs are clear. Pulmonary vasculature is normal. No consolidation, pleural effusion, or pneumothorax. No acute osseous abnormalities are seen. Her shoulder arthropathy. Surgical clips in the left axilla. IMPRESSION: No acute chest findings. Electronically Signed   By: Keith Rake M.D.   On: 02/26/2022 22:39   CT HEAD WO CONTRAST (5MM)  Result Date: 02/26/2022 CLINICAL DATA:  Mental status change, unknown cause, unwitnessed fall EXAM: CT HEAD WITHOUT CONTRAST TECHNIQUE: Contiguous axial images were obtained from the base of the skull through the vertex without intravenous contrast. RADIATION DOSE REDUCTION: This exam was performed according to the  departmental dose-optimization program which includes automated exposure control, adjustment of the mA and/or kV according to patient size  and/or use of iterative reconstruction technique. COMPARISON:  None Available. FINDINGS: Brain: No acute intracranial abnormality. Specifically, no hemorrhage, hydrocephalus, mass lesion, acute infarction, or significant intracranial injury. Vascular: No hyperdense vessel or unexpected calcification. Skull: No acute calvarial abnormality. Sinuses/Orbits: No acute findings Other: None IMPRESSION: No acute intracranial abnormality. Electronically Signed   By: Rolm Baptise M.D.   On: 02/26/2022 21:21   DG Knee Complete 4 Views Right  Result Date: 02/26/2022 CLINICAL DATA:  Fall, pain EXAM: RIGHT KNEE - COMPLETE 4+ VIEW COMPARISON:  08/12/2018 FINDINGS: Prior right knee replacement. No acute bony abnormality. Specifically, no fracture, subluxation, or dislocation. No hardware complicating feature. No joint effusion. Soft tissues are intact. IMPRESSION: No acute bony abnormality. Electronically Signed   By: Rolm Baptise M.D.   On: 02/26/2022 21:16   DG Knee Complete 4 Views Left  Result Date: 02/26/2022 CLINICAL DATA:  Weakness, fall EXAM: LEFT KNEE - COMPLETE 4+ VIEW COMPARISON:  09/22/2019 FINDINGS: Prior left knee replacement. No hardware complicating feature. Insert bones small joint effusion. IMPRESSION: No acute bony abnormality.  Small joint effusion. Electronically Signed   By: Rolm Baptise M.D.   On: 02/26/2022 21:15     TELEMETRY reviewed by me: Overnight atrial flutter with RVR with rate in the 130s to 140s, the morning of 8/10 heart rate well-controlled in atrial flutter in the 80s to 90s -with increased to the 110s to 120s later in the afternoon.  EKG reviewed by me: 8/9 atrial flutter with variable block rate 145.  ASSESSMENT AND PLAN:  Krystal [name] is a 56yoF with PMH of hypertension, OSA, morbid obesity, paroxysmal atrial fibrillation, type 2 diabetes,  significant alcohol use, fibromyalgia, depression who presented to [facility] ED 02/26/2022 after being found down by her husband.  The patient's husband had been away on a trip for several days and found the patient on the floor with diarrhea in various places around her home.  Initially in the ED the patient was encephalopathic, was in atrial fibrillation with RVR, febrile, with a significant AKI.  Urine culture grew E. coli she has been treated with empiric antibiotics.  Cardiology is consulted on hospital day 3 due to periods of A-fib with RVR.  # Paroxysmal atrial fibrillation/flutter with RVR # Sepsis 2/2 E. coli UTI # Significant alcohol use # Acute encephalopathy, resolved She presented to [facility] ED after being found down by her husband for an unknown amount of time (possibly 2-3 days) with diarrhea found throughout the home.  She met SIRS criteria on presentation was found to have an E. coli UTI, treated with empiric antibiotics and IV fluids.  She was in atrial fibrillation with rate in the low 100s on admission, but became more tachycardic overnight on 8/9 requiring an IV dose of metoprolol in addition to being febrile. -Agree with current therapy per primary team -Monitor and replete electrolytes to a K >4, mag >2 -Continue to treat underlying causes of increased adrenergic tone including fever, pain, infection -diltiazem dose increased yesterday afternoon from 30 mg every 6 hours to 60 with better heart rate control in the 90s to low 100s -Consolidate to Cardizem CD 240 mg tomorrow. -Discontinue home atenolol 100 mg daily in favor of diltiazem -Continue Eliquis 5 mg twice daily for stroke risk reduction.  CHA2DS2-VASc 4. -Echocardiogram complete resulted with preserved LVEF without significant valvular abnormalities. -Likely okay for discharge later today, if not tomorrow morning from a cardiac standpoint.  Will need follow-up with Dr. Clayborn Bigness in 1 to 2 weeks.  This patient's plan of care was  discussed and created with Dr. Nehemiah Massed and he is in agreement.  Signed: Tristan Schroeder , PA-C 03/02/2022, 9:03 AM [facility]  The patient appears to be significantly improved from hospital admission.  The patient has had atrial flutter with rapid ventricular rate although much better controlled at this time with Cardizem 240 mg CD orally.  Patient overall has had normal LV systolic function and no evidence of valvular heart disease.  The patient has had no evidence of acute coronary syndrome and with hydration has felt much better.  She has ambulated minimally and is now on anticoagulation for further risk reduction in stroke.  She understands all the risk and benefits of treatment options and will be okay for discharged home from the cardiac standpoint with follow-up and further adjustments of medication management and/or further consideration of electrical cardioversion to normal sinus rhythm.  We have discussed at length the risk and benefits of electrical cardioversion to normal sinus rhythm and she understands and agrees  The patient has been interviewed and examined. I agree with assessment and plan above. Serafina Royals MD [facility]

## 2022-03-02 NOTE — TOC Transition Note (Signed)
Transition of Care [facility]) - CM/SW Discharge Note   Patient Details  Name: MORRISA ALDABA MRN: [id number] Date of Birth: Jun 25, 1951  Transition of Care [facility]) CM/SW Contact:  Alberteen Sam, LCSW Phone Number: 03/02/2022, 12:36 PM   Clinical Narrative:     Patient to dc home today with [name] PT and OT through Renold Don with [facility] informed of discharge today.   Final next level of care: [facility] Barriers to Discharge: No Barriers Identified   Patient Goals and CMS Choice Patient states their goals for this hospitalization and ongoing recovery are:: to go home CMS Medicare.gov Compare Post Acute Care list provided to:: Patient Choice offered to / list presented to : Patient  Discharge Placement                       Discharge Plan and Services   Discharge Planning Services: CM Consult Post Acute Care Choice: Home Health          DME Arranged: N/A DME Agency: NA       HH Arranged: PT, OT [place] Agency: [facility] Date [place]: 03/02/22 Time [place]: 4920 Representative spoke with at [place]: [place] ([place]) Interventions     Readmission Risk Interventions    02/28/2022    6:14 PM  Readmission Risk Prevention Plan  Transportation Screening Complete  PCP or Specialist Appt within 3-5 Days Complete  HRI or [place] Complete  Social Work Consult for [place] Planning/Counseling Not Complete  SW consult not completed comments NA  Palliative Care Screening Not Applicable  Medication Review Press photographer) Referral to Pharmacy

## 2022-03-02 NOTE — Care Management Important Message (Signed)
Important Message  Patient Details  Name: Krystal [name] MRN: [id number] Date of Birth: [birth year]   Medicare Important Message Given:  Yes     Dannette Barbara 03/02/2022, 12:51 PM

## 2022-03-02 NOTE — Progress Notes (Signed)
Physical Therapy Treatment Patient Details Name: Krystal [name] MRN: [id number] DOB: 1950-10-07 Today's Date: 03/02/2022   History of Present Illness Patient is a 71 year old female brought in the emergency room on 8/7 after being found down by her husband. AMS initially secondary to sepsis and dehydration, MRI negative for CVA. A-fib with RVR, diarrhea, UTI, AKI.   PT Comments    Patient agreeable to PT. She is hopeful to discharge home today if possible. The patient has improved with her functional independence and activity tolerance. She ambulated a lap around nursing station without assistive device with supervision. Mod I for all other mobility. She was mildly short of breath after walking with Sp02 95% on room air, heart rate 114bpm. Recommend routine short bouts of walking at home for conditioning. Recommend PT follow up to maximize independence.    Recommendations for follow up therapy are one component of a multi-disciplinary discharge planning process, led by the attending physician.  Recommendations may be updated based on patient status, additional functional criteria and insurance authorization.  Follow Up Recommendations  Home health PT     Assistance Recommended at Discharge Set up Supervision/Assistance  Patient can return home with the following Help with stairs or ramp for entrance;Assist for transportation   Equipment Recommendations  None recommended by PT    Recommendations for Other Services       Precautions / Restrictions Precautions Precautions: Fall Restrictions Weight Bearing Restrictions: No     Mobility  Bed Mobility Overal bed mobility: Modified Independent                  Transfers Overall transfer level: Modified independent                      Ambulation/Gait Ambulation/Gait assistance: Supervision Gait Distance (Feet): 175 Feet Assistive device: None Gait Pattern/deviations: Step-through pattern Gait velocity:  decreased initially     General Gait Details: mild unsteadiness initially that improved with increased ambulation distance without assistive device. occasional cues for safety. patient reports no dizziness with activity. mild dyspnea with exertion. Sp02 95% on room air. heart rate 114 bpm after walking. encouraged routine short bouts of walking at home for conditioning   Stairs             Wheelchair Mobility    Modified Rankin (Stroke Patients Only)       Balance Overall balance assessment: Needs assistance Sitting-balance support: Feet supported Sitting balance-Leahy Scale: Good       Standing balance-Leahy Scale: Good Standing balance comment: patient able to reach outside base of support with no loss of balance                            Cognition Arousal/Alertness: Awake/alert Behavior During Therapy: WFL for tasks assessed/performed Overall Cognitive Status: Within Functional Limits for tasks assessed                                          Exercises      General Comments        Pertinent Vitals/Pain Pain Assessment Pain Assessment: No/denies pain    Home Living                          Prior Function  PT Goals (current goals can now be found in the care plan section) Acute Rehab PT Goals Patient Stated Goal: to return home PT Goal Formulation: With patient Time For Goal Achievement: 03/14/22 Potential to Achieve Goals: Good Progress towards PT goals: Progressing toward goals    Frequency    Min 2X/week      PT Plan Current plan remains appropriate    Co-evaluation              AM-PAC PT "6 Clicks" Mobility   Outcome Measure  Help needed turning from your back to your side while in a flat bed without using bedrails?: None Help needed moving from lying on your back to sitting on the side of a flat bed without using bedrails?: None Help needed moving to and from a bed to a  chair (including a wheelchair)?: None Help needed standing up from a chair using your arms (e.g., wheelchair or bedside chair)?: A Little Help needed to walk in hospital room?: A Little Help needed climbing 3-5 steps with a railing? : A Little 6 Click Score: 21    End of Session Equipment Utilized During Treatment: Gait belt Activity Tolerance: Patient tolerated treatment well Patient left: in bed;with call bell/phone within reach Nurse Communication: Mobility status PT Visit Diagnosis: Unsteadiness on feet (R26.81);Muscle weakness (generalized) (M62.81)     Time: 9628-3662 PT Time Calculation (min) (ACUTE ONLY): 15 min  Charges:  $Gait Training: 8-22 mins                     Minna Merritts, PT, MPT    Percell Locus 03/02/2022, 11:45 AM

## 2022-03-03 LAB — CULTURE, BLOOD (ROUTINE X 2)
Culture: NO GROWTH
Culture: NO GROWTH

## 2022-03-13 DIAGNOSIS — I10 Essential (primary) hypertension: Secondary | ICD-10-CM | POA: Diagnosis not present

## 2022-03-13 DIAGNOSIS — I4892 Unspecified atrial flutter: Secondary | ICD-10-CM | POA: Diagnosis not present

## 2022-03-13 DIAGNOSIS — E669 Obesity, unspecified: Secondary | ICD-10-CM | POA: Diagnosis not present

## 2022-03-13 DIAGNOSIS — F1010 Alcohol abuse, uncomplicated: Secondary | ICD-10-CM | POA: Diagnosis not present

## 2022-03-13 DIAGNOSIS — N1832 Chronic kidney disease, stage 3b: Secondary | ICD-10-CM | POA: Diagnosis not present

## 2022-03-13 DIAGNOSIS — E782 Mixed hyperlipidemia: Secondary | ICD-10-CM | POA: Diagnosis not present

## 2022-03-13 DIAGNOSIS — Z0100 Encounter for examination of eyes and vision without abnormal findings: Secondary | ICD-10-CM | POA: Diagnosis not present

## 2022-03-13 DIAGNOSIS — E876 Hypokalemia: Secondary | ICD-10-CM | POA: Diagnosis not present

## 2022-03-13 DIAGNOSIS — I4891 Unspecified atrial fibrillation: Secondary | ICD-10-CM | POA: Diagnosis not present

## 2022-03-13 DIAGNOSIS — R002 Palpitations: Secondary | ICD-10-CM | POA: Diagnosis not present

## 2022-03-13 DIAGNOSIS — R251 Tremor, unspecified: Secondary | ICD-10-CM | POA: Diagnosis not present

## 2022-03-14 DIAGNOSIS — R251 Tremor, unspecified: Secondary | ICD-10-CM | POA: Diagnosis not present

## 2022-03-14 DIAGNOSIS — E876 Hypokalemia: Secondary | ICD-10-CM | POA: Diagnosis not present

## 2022-03-15 ENCOUNTER — Telehealth: Payer: Self-pay | Admitting: Family Medicine

## 2022-03-15 MED ORDER — GEMTESA 75 MG PO TABS: 75.0000 mg | ORAL_TABLET | Freq: Every day | ORAL | 0 refills | Status: DC

## 2022-03-15 NOTE — Telephone Encounter (Signed)
Patient notified per MacDiarmid she is to try [facility]. Samples were put up front for pick up. She is to call our office to let us know if they are helping.

## 2022-03-15 NOTE — Telephone Encounter (Signed)
Patient called and states the Myrbetriq caused urinary retention and Oxybutynin is not working to stop incontinence at night. She is having incontinence during the day and at night. She is wanting to know if there is anything else she can take. Please advise

## 2022-03-21 DIAGNOSIS — F1010 Alcohol abuse, uncomplicated: Secondary | ICD-10-CM | POA: Diagnosis not present

## 2022-03-21 DIAGNOSIS — R55 Syncope and collapse: Secondary | ICD-10-CM | POA: Diagnosis not present

## 2022-03-21 DIAGNOSIS — I4891 Unspecified atrial fibrillation: Secondary | ICD-10-CM | POA: Diagnosis not present

## 2022-03-21 DIAGNOSIS — I890 Lymphedema, not elsewhere classified: Secondary | ICD-10-CM | POA: Diagnosis not present

## 2022-03-21 DIAGNOSIS — I10 Essential (primary) hypertension: Secondary | ICD-10-CM | POA: Diagnosis not present

## 2022-03-21 DIAGNOSIS — G4733 Obstructive sleep apnea (adult) (pediatric): Secondary | ICD-10-CM | POA: Diagnosis not present

## 2022-03-21 DIAGNOSIS — E876 Hypokalemia: Secondary | ICD-10-CM | POA: Diagnosis not present

## 2022-03-21 DIAGNOSIS — G2581 Restless legs syndrome: Secondary | ICD-10-CM | POA: Diagnosis not present

## 2022-03-27 ENCOUNTER — Other Ambulatory Visit: Payer: Self-pay | Admitting: *Deleted

## 2022-04-03 DIAGNOSIS — E119 Type 2 diabetes mellitus without complications: Secondary | ICD-10-CM | POA: Diagnosis not present

## 2022-04-09 ENCOUNTER — Ambulatory Visit: Payer: Medicare HMO | Admitting: Urology

## 2022-04-09 NOTE — Telephone Encounter (Signed)
Patient called the office to advise that the Gemtesa samples are working well for her.    She is sick with a sinus infection and had to reschedule her appt with Dr. Matilde Sprang on 9/18 to 9/25.  Samples of Gemtesa placed up front for her to pick up.

## 2022-04-12 ENCOUNTER — Inpatient Hospital Stay: Payer: Medicare HMO | Attending: Adult Health | Admitting: Adult Health

## 2022-04-12 ENCOUNTER — Ambulatory Visit (HOSPITAL_COMMUNITY)
Admission: RE | Admit: 2022-04-12 | Discharge: 2022-04-12 | Disposition: A | Discharge status: Home / Self Care | Payer: Medicare HMO | Source: Ambulatory Visit | Attending: Adult Health | Admitting: Adult Health

## 2022-04-12 ENCOUNTER — Telehealth: Payer: Self-pay | Admitting: Adult Health

## 2022-04-12 ENCOUNTER — Other Ambulatory Visit: Payer: Self-pay

## 2022-04-12 ENCOUNTER — Encounter: Payer: Self-pay | Admitting: Adult Health

## 2022-04-12 VITALS — BP 118/84 | HR 92 | Temp 97.9°F | Resp 18 | Ht 63.0 in | Wt 197.6 lb

## 2022-04-12 DIAGNOSIS — Z170 Estrogen receptor positive status [ER+]: Secondary | ICD-10-CM

## 2022-04-12 DIAGNOSIS — I129 Hypertensive chronic kidney disease with stage 1 through stage 4 chronic kidney disease, or unspecified chronic kidney disease: Secondary | ICD-10-CM | POA: Insufficient documentation

## 2022-04-12 DIAGNOSIS — C50512 Malignant neoplasm of lower-outer quadrant of left female breast: Secondary | ICD-10-CM

## 2022-04-12 DIAGNOSIS — Z800 Family history of malignant neoplasm of digestive organs: Secondary | ICD-10-CM | POA: Insufficient documentation

## 2022-04-12 DIAGNOSIS — Z801 Family history of malignant neoplasm of trachea, bronchus and lung: Secondary | ICD-10-CM | POA: Insufficient documentation

## 2022-04-12 DIAGNOSIS — R059 Cough, unspecified: Secondary | ICD-10-CM | POA: Diagnosis not present

## 2022-04-12 DIAGNOSIS — R062 Wheezing: Secondary | ICD-10-CM | POA: Diagnosis not present

## 2022-04-12 DIAGNOSIS — Z807 Family history of other malignant neoplasms of lymphoid, hematopoietic and related tissues: Secondary | ICD-10-CM | POA: Diagnosis not present

## 2022-04-12 DIAGNOSIS — Z90710 Acquired absence of both cervix and uterus: Secondary | ICD-10-CM | POA: Insufficient documentation

## 2022-04-12 DIAGNOSIS — Z803 Family history of malignant neoplasm of breast: Secondary | ICD-10-CM | POA: Diagnosis not present

## 2022-04-12 DIAGNOSIS — R0781 Pleurodynia: Secondary | ICD-10-CM | POA: Diagnosis not present

## 2022-04-12 DIAGNOSIS — Z853 Personal history of malignant neoplasm of breast: Secondary | ICD-10-CM | POA: Diagnosis not present

## 2022-04-12 DIAGNOSIS — N1832 Chronic kidney disease, stage 3b: Secondary | ICD-10-CM | POA: Diagnosis not present

## 2022-04-12 DIAGNOSIS — M19012 Primary osteoarthritis, left shoulder: Secondary | ICD-10-CM | POA: Diagnosis not present

## 2022-04-12 DIAGNOSIS — Z9013 Acquired absence of bilateral breasts and nipples: Secondary | ICD-10-CM | POA: Diagnosis not present

## 2022-04-12 DIAGNOSIS — I4891 Unspecified atrial fibrillation: Secondary | ICD-10-CM

## 2022-04-12 MED ORDER — ALBUTEROL SULFATE HFA 108 (90 BASE) MCG/ACT IN AERS: 2.0000 | INHALATION_SPRAY | Freq: Four times a day (QID) | RESPIRATORY_TRACT | 2 refills | Status: DC | PRN

## 2022-04-12 NOTE — Progress Notes (Signed)
[facility] Follow up:    Krystal Pink, MD [place] [address]   DIAGNOSIS:  Cancer Staging  Breast cancer of lower-outer quadrant of left female breast [facility]) Staging form: Breast, AJCC 6th Edition - Clinical: Stage Unknown ([place], N1, MX) - Signed by [name], MD on 08/31/2013 Histopathologic type: Infiltrating duct carcinoma, NOS Laterality: Left - Pathologic: No stage assigned - Unsigned Histopathologic type: Infiltrating duct carcinoma, NOS Laterality: Left   SUMMARY OF ONCOLOGIC HISTORY: Oncology History  Breast cancer of lower-outer quadrant of left female breast ([place])  12/28/2010 Surgery   Bilateral mastectomies: Left: no residual cancer, 1/15 LN positive; Right: benign, 0/3 LN   01/29/2011 - 07/02/2011 Chemotherapy   adjuvant chemotherapy following NSABP B. 49 clinical trial. She received 4 cycles of dose dense Adriamycin and Cytoxan followed by weekly single agent Taxol    07/31/2011 -  Anti-estrogen oral therapy   tamoxifen 10 mg daily daily. A total of 10 years therapy is planned based on BCI testing inability to be performed to to insufficient tissue. She has also been on Aromasin and Arimidex in the past, and was unable to tolerate these      CURRENT THERAPY: observation  INTERVAL HISTORY: Krystal [name] 71 y.o. female returns for f/u of her history of breast cancer.  She told me that she had a difficult August.  She reminded me that when we saw her last year she had a funny heart rhythm and we did an EKG which we gave her a copy and she brought it to her cardiologist.  She tells me that she was not put on the monitor and that this past August she was found lying on the floor by her spouse.  Her spouse goes out of town on trips and there was also diarrhea in different places in the house.  She has no memory of this event but when she was found she was in A-fib with RVR.  O[place]lla tells me that since that episode  she has had some chest wall pain and wants me to look at her implants.  She has also had some sinus drainage and a cough and is completing azithromycin for that.  She denies any new issues other than what is noted above with the recent changes.   Patient Active Problem List   Diagnosis Date Noted   Breast cancer of lower-outer quadrant of left female breast ([place]) 01/16/2011    Priority: High   Depression 02/27/2022   Gastroesophageal reflux disease 02/27/2022   Hypertension 02/27/2022   Electrolyte abnormality 02/27/2022   Abnormal LFTs 02/27/2022   Lactic acidosis 02/27/2022   Alcohol abuse 02/27/2022   Atrial fibrillation with RVR ([place]) 02/27/2022   Acute kidney injury superimposed on Stage 3B chronic kidney disease ([place]) 02/27/2022   Morbid obesity ([place]) 02/27/2022   Urinary tract infection 02/27/2022   Diarrhea 02/27/2022   Status post total knee replacement using cement, left 09/22/2019   Status post total knee replacement using cement, right 08/12/2018   Rotator cuff rupture    Acquired absence of bilateral breasts and nipples 09/17/2012   Edema 07/27/2011    is allergic to cephalexin and levaquin [levofloxacin].  MEDICAL HISTORY: Past Medical History:  Diagnosis Date   Anemia    Arthritis    osteo   Blindness of right eye at birth   Cancer [facility]) 2011   Left breast 2011 and cervical age 71   Carpal tunnel  syndrome    Cataract    Depression    Edema 07/27/2011   Fibromyalgia    muscle weakness and pain   GERD (gastroesophageal reflux disease)    Hyperlipidemia    Hypertension    benign   Lymphedema    Osteoporosis    Plantar fasciitis    Pre-diabetes    Raynaud's disease    Restless leg syndrome    Rosacea    Rotator cuff rupture    Sleep apnea    Synovitis    Ulcer     SURGICAL HISTORY: Past Surgical History:  Procedure Laterality Date   ABDOMINAL HYSTERECTOMY  1986   For bleeding and pain   BLADDER SURGERY  2006   Bladder tack   BREAST  RECONSTRUCTION Bilateral 09/17/2012   Procedure: BREAST RECONSTRUCTION;  Surgeon: Theodoro Kos, DO;  Location: [place];  Service: Plastics;  Laterality: Bilateral;  BILATERAL BREAST RECONSTRUCTION WITH TISSUE EXPANDERS AND ALLOMED   BREAST RECONSTRUCTION Right 06/03/2013   Procedure: RIGHT BREAST CAPSULE CONSTRACTURE;  Surgeon: Theodoro Kos, DO;  Location: [place];  Service: Plastics;  Laterality: Right;   CARPAL TUNNEL RELEASE Bilateral 2008   EYE SURGERY Right    INJECTION KNEE Left 08/12/2018   Procedure: KNEE INJECTION-LEFT;  Surgeon: Corky Mull, MD;  Location: [facility];  Service: Orthopedics;  Laterality: Left;   JOINT REPLACEMENT     LIPOSUCTION Bilateral 01/29/2013   Procedure: LIPOSUCTION;  Surgeon: Theodoro Kos, DO;  Location: [place];  Service: Plastics;  Laterality: Bilateral;   LIPOSUCTION Right 06/03/2013   Procedure: LIPOSUCTION;  Surgeon: Theodoro Kos, DO;  Location: [place];  Service: Plastics;  Laterality: Right;   MASTECTOMY  2012   rt prophalactic mast-snbx   MASTECTOMY MODIFIED RADICAL  2012   left-axillary nodes   NEUROMA SURGERY Bilateral    NOSE SURGERY  2007   PORT-A-CATH REMOVAL     insertion and   THROAT SURGERY     TONSILLECTOMY     TOTAL KNEE ARTHROPLASTY Right 08/12/2018   Procedure: TOTAL KNEE ARTHROPLASTY-RIGHT;  Surgeon: Corky Mull, MD;  Location: [facility];  Service: Orthopedics;  Laterality: Right;   TOTAL KNEE ARTHROPLASTY Left 09/22/2019   Procedure: TOTAL KNEE ARTHROPLASTY;  Surgeon: Corky Mull, MD;  Location: [facility];  Service: Orthopedics;  Laterality: Left;   UVULOPALATOPHARYNGOPLASTY      SOCIAL HISTORY: Social History   Socioeconomic History   Marital status: Married    Spouse name: Not on file   Number of children: Not on file   Years of education: Not on file   Highest education level: Not on file  Occupational History   Not on file  Tobacco Use    Smoking status: Never    Passive exposure: Never   Smokeless tobacco: Never  Vaping Use   Vaping Use: Never used  Substance and Sexual Activity   Alcohol use: Yes    Comment: 16 oz per day wine   Drug use: No   Sexual activity: Yes  Other Topics Concern   Not on file  Social History Narrative   Not on file   Social Determinants of Health   Financial Resource Strain: Not on file  Food Insecurity: Not on file  Transportation Needs: Not on file  Physical Activity: Not on file  Stress: Not on file  Social Connections: Not on file  Intimate Partner Violence: Not on file    FAMILY HISTORY: Family History  Problem Relation Age of Onset   Cancer Mother 55       non hodgkins lymphoma   Cancer Father        neck, throat, lung   Lung cancer Father    Throat cancer Father    Hypertension Sister    Cancer Sister 85       Breast cancer dx'd 04/2011   Hypertension Brother    Breast cancer Maternal Aunt    Liver cancer Maternal Uncle    Cancer Maternal Grandmother        died 80, unknown cancer   Cancer Maternal Grandfather        died in his 81s; unknown cancer   Lung cancer Maternal Uncle    Throat cancer Maternal Uncle    Breast cancer Maternal Aunt    Cancer Cousin        died under the age 37, unknown cancer   Cancer Cousin        died age 44; unknown cancer    Review of Systems  Constitutional:  Positive for fatigue. Negative for appetite change, chills, fever and unexpected weight change.  HENT:   Negative for hearing loss, lump/mass and trouble swallowing.   Eyes:  Negative for eye problems and icterus.  Respiratory:  Negative for chest tightness, cough and shortness of breath.   Cardiovascular:  Negative for chest pain, leg swelling and palpitations.  Gastrointestinal:  Negative for abdominal distention, abdominal pain, constipation, diarrhea, nausea and vomiting.  Endocrine: Negative for hot flashes.  Genitourinary:  Negative for difficulty urinating.    Musculoskeletal:  Negative for arthralgias.  Skin:  Negative for itching and rash.  Neurological:  Negative for dizziness, extremity weakness, headaches and numbness.  Hematological:  Negative for adenopathy. Does not bruise/bleed easily.  Psychiatric/Behavioral:  Negative for depression. The patient is not nervous/anxious.       PHYSICAL EXAMINATION  ECOG PERFORMANCE STATUS: 2 - Symptomatic, <50% confined to bed  Vitals:   04/12/22 1023  BP: 118/84  Pulse: 92  Resp: 18  Temp: 97.9 F (36.6 C)  SpO2: 96%    Physical Exam Constitutional:      General: She is not in acute distress.    Appearance: Normal appearance. She is not toxic-appearing.  HENT:     Head: Normocephalic and atraumatic.  Eyes:     General: No scleral icterus. Cardiovascular:     Rate and Rhythm: Normal rate and regular rhythm.     Pulses: Normal pulses.     Heart sounds: Normal heart sounds.  Pulmonary:     Effort: Pulmonary effort is normal.     Breath sounds: Wheezing present.  Chest:       Comments: Status post bilateral mastectomies with implants in place.  She does have costochondral tenderness however there are no nodules masses thickening or concerns for breast cancer recurrence. Abdominal:     General: Abdomen is flat. Bowel sounds are normal. There is no distension.     Palpations: Abdomen is soft.     Tenderness: There is no abdominal tenderness.  Musculoskeletal:        General: No swelling.     Cervical back: Neck supple.  Lymphadenopathy:     Cervical: No cervical adenopathy.  Skin:    General: Skin is warm and dry.     Findings: No rash.  Neurological:     General: No focal deficit present.     Mental Status: She is alert.  Psychiatric:  Mood and Affect: Mood normal.        Behavior: Behavior normal.     LABORATORY DATA: None for this visit   ASSESSMENT and THERAPY PLAN:   Breast cancer of lower-outer quadrant of left female breast ([place]) Krystal [name] is here today  for follow-up of her history of left-sided breast cancer that dates back to the year 2012.  She is status post bilateral mastectomies, adjuvant chemotherapy, and antiestrogen therapy.  [place] has no clinical or radiographic sign of breast cancer recurrence.  She does have costochondral tenderness and pain in her shoulder and chest wall.  She also has wheezing.  We will get x-rays of her ribs shoulder and chest to evaluate for any developing pneumonia fractures or etiology of the pain.  I will send in an albuterol inhaler for her to use to help with the wheezing.  She will continue to follow-up with Dr. Kary Kos about her cough.  We will see Krystal [name] back in 1 year for continued long-term survivorship.   All questions were answered. The patient knows to call the clinic with any problems, questions or concerns. We can certainly see the patient much sooner if necessary.  Total encounter time:40 minutes*in face-to-face visit time, chart review, lab review, care coordination, order entry, and documentation of the encounter time.    Wilber Bihari, NP 04/13/22 5:51 PM Medical Oncology and Hematology [facility] [place], [place] [address] Tel. [phone]    Fax. [phone]  *Total Encounter Time as defined by the Centers for Medicare and Medicaid Services includes, in addition to the face-to-face time of a patient visit (documented in the note above) non-face-to-face time: obtaining and reviewing outside history, ordering and reviewing medications, tests or procedures, care coordination (communications with other health care professionals or caregivers) and documentation in the medical record.

## 2022-04-12 NOTE — Telephone Encounter (Signed)
Left patient a message regarding 9/23 appointment

## 2022-04-13 ENCOUNTER — Telehealth: Payer: Self-pay | Admitting: *Deleted

## 2022-04-13 NOTE — Assessment & Plan Note (Signed)
Krystal [name] is here today for follow-up of her history of left-sided breast cancer that dates back to the year 2012.  She is status post bilateral mastectomies, adjuvant chemotherapy, and antiestrogen therapy.  [name] has no clinical or radiographic sign of breast cancer recurrence.  She does have costochondral tenderness and pain in her shoulder and chest wall.  She also has wheezing.  We will get x-rays of her ribs shoulder and chest to evaluate for any developing pneumonia fractures or etiology of the pain.  I will send in an albuterol inhaler for her to use to help with the wheezing.  She will continue to follow-up with Dr. Kary Kos about her cough.  We will see Krystal [name] back in 1 year for continued long-term survivorship.

## 2022-04-16 ENCOUNTER — Encounter: Payer: Self-pay | Admitting: Urology

## 2022-04-16 ENCOUNTER — Telehealth: Payer: Self-pay

## 2022-04-16 ENCOUNTER — Other Ambulatory Visit: Payer: Self-pay

## 2022-04-16 ENCOUNTER — Ambulatory Visit: Payer: Medicare HMO | Admitting: Urology

## 2022-04-16 VITALS — BP 132/75 | HR 79 | Ht 63.0 in | Wt 197.4 lb

## 2022-04-16 DIAGNOSIS — R32 Unspecified urinary incontinence: Secondary | ICD-10-CM

## 2022-04-16 DIAGNOSIS — N3946 Mixed incontinence: Secondary | ICD-10-CM

## 2022-04-16 LAB — URINALYSIS, COMPLETE
Bilirubin, UA: NEGATIVE
Glucose, UA: NEGATIVE
Ketones, UA: NEGATIVE
Nitrite, UA: NEGATIVE
RBC, UA: NEGATIVE
Specific Gravity, UA: 1.025 (ref 1.005–1.030)
Urobilinogen, Ur: 1 mg/dL (ref 0.2–1.0)
pH, UA: 6 (ref 5.0–7.5)

## 2022-04-16 LAB — MICROSCOPIC EXAMINATION: Epithelial Cells (non renal): >10 /hpf — AB (ref 0–10)

## 2022-04-16 MED ORDER — GEMTESA 75 MG PO TABS: 1.0000 | ORAL_TABLET | ORAL | 2 refills | Status: AC

## 2022-04-16 MED ORDER — GEMTESA 75 MG PO TABS: 75.0000 mg | ORAL_TABLET | Freq: Every day | ORAL | 0 refills | Status: DC

## 2022-04-16 NOTE — Progress Notes (Signed)
04/16/2022 3:46 PM   JEANINE CAVEN 1950-09-01 [id number]  Referring provider: [name], MD [address] [facility] [place],  [place] [address]  No chief complaint on file.   HPI: I was consulted to assist the patient's urinary incontinence.  She has urge incontinence.  She leaks with coughing sneezing bending.  Both are significant.  She leaks when she gets in and out of her [name] and when she goes from a sitting to standing position.  She can soak up to 5 pads a day.  No bedwetting  She voids every hour but might be able to hold it for 2 hours.  She knows her every bathroom is.  Gets up 4 times a night.  She is on a diuretic.  She has ankle edema  She had a bladder suspension 30 years ago.  She had a hysterectomy.  She is prone to constipation     Well supported bladder neck and no stress incontinence   patient has mixed incontinence.  Call if culture is positive.  She has hourly frequency and significant nocturia and likely has nocturnal diuresis she will return for urodynamics and cystoscopy.  Based upon the pelvic examination and severity of incontinence I would not be surprised if her primary problem is overactive bladder   Today Frequency stable.  Incontinence stable.  Last culture she is Streptococcus in the urine On urodynamics she did not void and was catheterized for 25 mL.  Maximum bladder capacity was 320 mL.  She had increased bladder sensation.  Bladder was unstable reaching pressure of 11 cm of water.  She had urgency but no incontinence.  No stress incontinence with Valsalva pressure of 139 cm of water.  During voiding she voided 245 mL with maximal flow of 7 mils per second.  Maximum voiding pressure 12 cm of water.  Residual 62 mL.  EMG activity mildly increased.  Some of the contractions were provoked with coughing and Valsalva.  She also leaks with positional changes.  She had a long prolonged voiding pattern.  The details of the urodynamics are signed  dictated  Cystoscopy: Patient underwent flexible cystoscopy utilizing sterile technique.  Bladder mucosa and trigone were normal.  No cysts foreign body or carcinoma.  Urine was a little bit cloudy so I sent it for culture again    Patient has mixed incontinence.  Approximately 80% of problem is overactive bladder with triggering with positional changes and standing.  High-volume leakage due to overactive bladder.  Refractory treatments would be the treatment of choice.  A bulking agent is some to consider if she wanted her stress incontinence treated.  Reassess in 6 weeks on Myrbetriq 50 mg samples and prescription.  Call if urine culture positive.  Consider prophylaxis if culture is positive again  Frequency stable Last culture was positive.  Culture prior to this was positive with low count Streptococcus.  She failed Myrbetriq.  Frequency stable  Patient completely dry on Gemtesa and is very happy.  Minimal burning.  Frequency improved.   PMH: Past Medical History:  Diagnosis Date   Anemia    Arthritis    osteo   Blindness of right eye at birth   Cancer [facility]) 2011   Left breast 2011 and cervical age 51   Carpal tunnel syndrome    Cataract    Depression    Edema 07/27/2011   Fibromyalgia    muscle weakness and pain   GERD (gastroesophageal reflux disease)    Hyperlipidemia  Hypertension    benign   Lymphedema    Osteoporosis    Plantar fasciitis    Pre-diabetes    Raynaud's disease    Restless leg syndrome    Rosacea    Rotator cuff rupture    Sleep apnea    Synovitis    Ulcer     Surgical History: Past Surgical History:  Procedure Laterality Date   ABDOMINAL HYSTERECTOMY  1986   For bleeding and pain   BLADDER SURGERY  2006   Bladder tack   BREAST RECONSTRUCTION Bilateral 09/17/2012   Procedure: BREAST RECONSTRUCTION;  Surgeon: Theodoro Kos, DO;  Location: [place];  Service: Plastics;  Laterality: Bilateral;  BILATERAL BREAST RECONSTRUCTION  WITH TISSUE EXPANDERS AND ALLOMED   BREAST RECONSTRUCTION Right 06/03/2013   Procedure: RIGHT BREAST CAPSULE CONSTRACTURE;  Surgeon: Theodoro Kos, DO;  Location: [place];  Service: Plastics;  Laterality: Right;   CARPAL TUNNEL RELEASE Bilateral 2008   EYE SURGERY Right    INJECTION KNEE Left 08/12/2018   Procedure: KNEE INJECTION-LEFT;  Surgeon: Corky Mull, MD;  Location: [facility];  Service: Orthopedics;  Laterality: Left;   JOINT REPLACEMENT     LIPOSUCTION Bilateral 01/29/2013   Procedure: LIPOSUCTION;  Surgeon: Theodoro Kos, DO;  Location: [place];  Service: Plastics;  Laterality: Bilateral;   LIPOSUCTION Right 06/03/2013   Procedure: LIPOSUCTION;  Surgeon: Theodoro Kos, DO;  Location: [place];  Service: Plastics;  Laterality: Right;   MASTECTOMY  2012   rt prophalactic mast-snbx   MASTECTOMY MODIFIED RADICAL  2012   left-axillary nodes   NEUROMA SURGERY Bilateral    NOSE SURGERY  2007   PORT-A-CATH REMOVAL     insertion and   THROAT SURGERY     TONSILLECTOMY     TOTAL KNEE ARTHROPLASTY Right 08/12/2018   Procedure: TOTAL KNEE ARTHROPLASTY-RIGHT;  Surgeon: Corky Mull, MD;  Location: [facility];  Service: Orthopedics;  Laterality: Right;   TOTAL KNEE ARTHROPLASTY Left 09/22/2019   Procedure: TOTAL KNEE ARTHROPLASTY;  Surgeon: Corky Mull, MD;  Location: [facility];  Service: Orthopedics;  Laterality: Left;   UVULOPALATOPHARYNGOPLASTY      Home Medications:  Allergies as of 04/16/2022       Reactions   Cephalexin Nausea Only   Diaphoresis / Sweating (intolerance)   Levaquin [levofloxacin] Other (See Comments)   Stiff joints, unable to walk.        Medication List        Accurate as of April 16, 2022  3:46 PM. If you have any questions, ask your nurse or doctor.          albuterol 108 (90 Base) MCG/ACT inhaler Commonly known as: VENTOLIN HFA Inhale 2 puffs into the lungs every 6 (six) hours as needed for  wheezing or shortness of breath.   alendronate 70 MG tablet Commonly known as: FOSAMAX Take 70 mg by mouth every Saturday. Take with a full glass of water on an empty stomach.   apixaban 5 MG Tabs tablet Commonly known as: ELIQUIS Take 1 tablet (5 mg total) by mouth 2 (two) times daily.   carisoprodol 350 MG tablet Commonly known as: SOMA Take 350 mg by mouth in the morning and at bedtime.   cetirizine 10 MG tablet Commonly known as: ZYRTEC Take 10 mg by mouth daily.   cholecalciferol 25 MCG (1000 UNIT) tablet Commonly known as: VITAMIN D3 Take 1,000 Units by mouth every evening.   COSAMIN  DS PO Take 1 tablet by mouth 3 (three) times a week. Take as needed   diltiazem 240 MG 24 hr capsule Commonly known as: CARDIZEM CD Take 1 capsule (240 mg total) by mouth daily.   fluticasone 50 MCG/ACT nasal spray Commonly known as: FLONASE Place 2 sprays into both nostrils daily.   folic acid 1 MG tablet Commonly known as: FOLVITE Take 1 tablet (1 mg total) by mouth daily.   furosemide 20 MG tablet Commonly known as: LASIX Take 20 mg by mouth 2 (two) times daily.   Gemtesa 75 MG Tabs Generic drug: Vibegron Take 75 mg by mouth daily.   lisinopril-hydrochlorothiazide 20-25 MG tablet Commonly known as: ZESTORETIC Take 1 tablet by mouth every other day. Please hold until you see your cardiologist   LYSINE ACETATE PO Take 4,000 mg by mouth daily as needed (cold sores).   magnesium oxide 400 MG tablet Commonly known as: MAG-OX Take 400 mg by mouth daily as needed (leg cramps).   metoprolol succinate 25 MG 24 hr tablet Commonly known as: TOPROL-XL Take 25 mg by mouth daily.   omeprazole 20 MG capsule Commonly known as: PRILOSEC Take 20 mg by mouth daily.   potassium chloride 10 MEQ tablet Commonly known as: KLOR-CON M Take 10 mEq by mouth in the morning and at bedtime.   pregabalin 300 MG capsule Commonly known as: LYRICA Take 300 mg by mouth 2 (two) times daily.    rOPINIRole 1 MG tablet Commonly known as: REQUIP Take 1 mg by mouth at bedtime.   triamcinolone 0.025 % cream Commonly known as: KENALOG APPLY 1 APPLICATION TOPICALLY DAILY AS NEEDED (RASH).   zolpidem 10 MG tablet Commonly known as: AMBIEN Take 10 mg by mouth at bedtime.        Allergies:  Allergies  Allergen Reactions   Cephalexin Nausea Only    Diaphoresis / Sweating (intolerance)   Levaquin [Levofloxacin] Other (See Comments)    Stiff joints, unable to walk.    Family History: Family History  Problem Relation Age of Onset   Cancer Mother 15       non hodgkins lymphoma   Cancer Father        neck, throat, lung   Lung cancer Father    Throat cancer Father    Hypertension Sister    Cancer Sister 37       Breast cancer dx'd 04/2011   Hypertension Brother    Breast cancer Maternal Aunt    Liver cancer Maternal Uncle    Cancer Maternal Grandmother        died 33, unknown cancer   Cancer Maternal Grandfather        died in his 81s; unknown cancer   Lung cancer Maternal Uncle    Throat cancer Maternal Uncle    Breast cancer Maternal Aunt    Cancer Cousin        died under the age 16, unknown cancer   Cancer Cousin        died age 31; unknown cancer    Social History:  reports that she has never smoked. She has never been exposed to tobacco smoke. She has never used smokeless tobacco. She reports current alcohol use. She reports that she does not use drugs.  ROS:  Physical Exam: There were no vitals taken for this visit.  Constitutional:  Alert and oriented, No acute distress. HEENT: [place] AT, moist mucus membranes.  Trachea midline, no masses.  Laboratory Data: Lab Results  Component Value Date   WBC 7.9 03/02/2022   HGB 12.5 03/02/2022   HCT 36.1 03/02/2022   MCV 97.6 03/02/2022   PLT 140 (L) 03/02/2022    Lab Results  Component Value Date   CREATININE 1.11 (H) 03/02/2022    No  results found for: "PSA"  No results found for: "TESTOSTERONE"  Lab Results  Component Value Date   HGBA1C 5.7 (H) 04/03/2019    Urinalysis    Component Value Date/Time   COLORURINE YELLOW (A) 02/27/2022 0106   APPEARANCEUR HAZY (A) 02/27/2022 0106   APPEARANCEUR Clear 02/05/2022 1150   LABSPEC 1.012 02/27/2022 0106   LABSPEC 1.005 04/06/2016 0937   PHURINE 5.0 02/27/2022 0106   GLUCOSEU NEGATIVE 02/27/2022 0106   GLUCOSEU Negative 04/06/2016 0937   HGBUR SMALL (A) 02/27/2022 0106   BILIRUBINUR NEGATIVE 02/27/2022 0106   BILIRUBINUR Negative 02/05/2022 1150   BILIRUBINUR Negative 04/06/2016 0937   KETONESUR NEGATIVE 02/27/2022 0106   PROTEINUR NEGATIVE 02/27/2022 0106   UROBILINOGEN 0.2 04/06/2016 0937   NITRITE NEGATIVE 02/27/2022 0106   LEUKOCYTESUR LARGE (A) 02/27/2022 0106   LEUKOCYTESUR Negative 04/06/2016 0937    Pertinent Imaging:   Assessment & Plan: Prescription of Gemtesa and samples given.  Call if culture positive.  Reassess durability in 4 months  1. Urinary incontinence, unspecified type  - Urinalysis, Complete   No follow-ups on file.  Reece Packer, MD  [facility] [address] [place], [place] [address] [phone]

## 2022-04-16 NOTE — Telephone Encounter (Signed)
No entry 

## 2022-04-16 NOTE — Telephone Encounter (Addendum)
Spoke with patient regarding imaging results. Patient voiced understanding that she can take Tylenol arthritis for the discomfort. Encouraged her to follow administration instructions on the product.  ----- Message from Gardenia Phlegm, NP sent at 04/13/2022  5:37 PM EDT ----- Odette Horns shows degeneration, no fracture or swelling in soft tissue noted, recommend she take tylenol arthritis  ----- Message ----- From: Interface, Rad Results In Sent: 04/13/2022  10:53 AM EDT To: Gardenia Phlegm, NP

## 2022-04-16 NOTE — Telephone Encounter (Addendum)
Called pt to advise of results below.  ----- Message from Gardenia Phlegm, NP sent at 04/13/2022  5:36 PM EDT ----- No pneumonia or sign of lung infection on chest xray  ----- Message ----- From: Interface, Rad Results In Sent: 04/13/2022  10:54 AM EDT To: Gardenia Phlegm, NP

## 2022-04-17 DIAGNOSIS — E782 Mixed hyperlipidemia: Secondary | ICD-10-CM | POA: Diagnosis not present

## 2022-04-17 DIAGNOSIS — N1832 Chronic kidney disease, stage 3b: Secondary | ICD-10-CM | POA: Diagnosis not present

## 2022-04-17 DIAGNOSIS — I4892 Unspecified atrial flutter: Secondary | ICD-10-CM | POA: Diagnosis not present

## 2022-04-17 DIAGNOSIS — E669 Obesity, unspecified: Secondary | ICD-10-CM | POA: Diagnosis not present

## 2022-04-17 DIAGNOSIS — I4891 Unspecified atrial fibrillation: Secondary | ICD-10-CM | POA: Diagnosis not present

## 2022-04-17 DIAGNOSIS — J329 Chronic sinusitis, unspecified: Secondary | ICD-10-CM | POA: Diagnosis not present

## 2022-04-17 DIAGNOSIS — G4733 Obstructive sleep apnea (adult) (pediatric): Secondary | ICD-10-CM | POA: Diagnosis not present

## 2022-04-17 DIAGNOSIS — I10 Essential (primary) hypertension: Secondary | ICD-10-CM | POA: Diagnosis not present

## 2022-04-19 DIAGNOSIS — I4892 Unspecified atrial flutter: Secondary | ICD-10-CM | POA: Diagnosis not present

## 2022-04-19 DIAGNOSIS — I4891 Unspecified atrial fibrillation: Secondary | ICD-10-CM | POA: Diagnosis not present

## 2022-04-19 DIAGNOSIS — R002 Palpitations: Secondary | ICD-10-CM | POA: Diagnosis not present

## 2022-04-23 ENCOUNTER — Telehealth: Payer: Self-pay | Admitting: Urology

## 2022-04-23 NOTE — Telephone Encounter (Signed)
Patient called the office today requesting Dr. Matilde Sprang or his assistant to call her to go over her urine test results.

## 2022-04-23 NOTE — Telephone Encounter (Signed)
Pt states she has white blood cells and would like to know if she needs a prescription.

## 2022-04-24 ENCOUNTER — Ambulatory Visit: Payer: Medicare HMO

## 2022-04-24 ENCOUNTER — Ambulatory Visit: Payer: Medicare HMO | Attending: Cardiovascular Disease | Admitting: Cardiovascular Disease

## 2022-04-24 ENCOUNTER — Encounter: Payer: Self-pay | Admitting: Cardiovascular Disease

## 2022-04-24 VITALS — BP 138/80 | HR 93 | Ht 63.0 in | Wt 197.1 lb

## 2022-04-24 DIAGNOSIS — I480 Paroxysmal atrial fibrillation: Secondary | ICD-10-CM | POA: Diagnosis not present

## 2022-04-24 DIAGNOSIS — R0609 Other forms of dyspnea: Secondary | ICD-10-CM | POA: Diagnosis not present

## 2022-04-24 DIAGNOSIS — I10 Essential (primary) hypertension: Secondary | ICD-10-CM | POA: Diagnosis not present

## 2022-04-24 NOTE — Patient Instructions (Signed)
Medication Instructions:  Your physician recommends that you continue on your current medications as directed. Please refer to the Current Medication list given to you today.  *If you need a refill on your cardiac medications before your next appointment, please call your pharmacy*   Lab Work: None ordered If you have labs (blood work) drawn today and your tests are completely normal, you will receive your results only by: [place] (if you have MyChart) OR A paper copy in the mail If you have any lab test that is abnormal or we need to change your treatment, we will call you to review the results.   Testing/Procedures: Your physician has requested that you have a lexiscan myoview. For further information please visit [email or web address]. Please follow instruction sheet, as given.   Your provider has ordered a heart monitor to wear for 14 days. This will be mailed to your home with instructions on placement. Once you have finished the time frame requested, you will return monitor in box provided.      Follow-Up: At [facility], you and your health needs are our priority.  As part of our continuing mission to provide you with exceptional heart care, we have created designated Provider Care Teams.  These Care Teams include your primary Cardiologist (physician) and Advanced Practice Providers (APPs -  Physician Assistants and Nurse Practitioners) who all work together to provide you with the care you need, when you need it.  We recommend signing up for the patient portal called "MyChart".  Sign up information is provided on this After Visit Summary.  MyChart is used to connect with patients for Virtual Visits (Telemedicine).  Patients are able to view lab/test results, encounter notes, upcoming appointments, etc.  Non-urgent messages can be sent to your provider as well.   To learn more about what you can do with MyChart, go to [email or web address].    Your next  appointment:   4 week(s)  The format for your next appointment:   In Person  Provider:   You may see Kathlyn Sacramento, MD or one of the following Advanced Practice Providers on your designated Care Team:   Murray Hodgkins, NP Christell Faith, PA-C Cadence Kathlen Mody, PA-C Gerrie Nordmann, NP   Other Instructions  [place]  Your caregiver has ordered a Stress Test with nuclear imaging. The purpose of this test is to evaluate the blood supply to your heart muscle. This procedure is referred to as a "Non-Invasive Stress Test." This is because other than having an IV started in your vein, nothing is inserted or "invades" your body. Cardiac stress tests are done to find areas of poor blood flow to the heart by determining the extent of coronary artery disease (CAD). Some patients exercise on a treadmill, which naturally increases the blood flow to your heart, while others who are  unable to walk on a treadmill due to physical limitations have a pharmacologic/chemical stress agent called Lexiscan . This medicine will mimic walking on a treadmill by temporarily increasing your coronary blood flow.   Please note: these test may take anywhere between 2-4 hours to complete  PLEASE REPORT TO [place] AT THE FIRST DESK WILL DIRECT YOU WHERE TO GO  Date of Procedure:_____________________________________  Arrival Time for Procedure:______________________________  Instructions regarding medication:    __X__:  Hold other medications as follows: Do not take Lasix the morning of the test. Ok to take it later that day   PLEASE NOTIFY THE OFFICE  AT LEAST 24 HOURS IN ADVANCE IF YOU ARE UNABLE TO KEEP YOUR APPOINTMENT.  [phone] AND  PLEASE NOTIFY NUCLEAR MEDICINE AT [facility] AT LEAST 24 HOURS IN ADVANCE IF YOU ARE UNABLE TO KEEP YOUR APPOINTMENT. [phone]  How to prepare for your Myoview test:  Do not eat or drink after midnight No caffeine for 24 hours prior to  test No smoking 24 hours prior to test. Your medication may be taken with water.  If your doctor stopped a medication because of this test, do not take that medication. Ladies, please do not wear dresses.  Skirts or pants are appropriate. Please wear a short sleeve shirt. No perfume, cologne or lotion. Wear comfortable walking shoes. No heels!

## 2022-04-24 NOTE — Progress Notes (Unsigned)
Cardiology Office Note   Date:  04/24/2022   ID:  Krystal [name], Krystal [name] 06/04/1951, MRN [id number]  PCP:  [name], MD  Cardiologist:   Kathlyn Sacramento, MD   Chief Complaint  Patient presents with   Other    Afib/syncope. Meds reviewed verbally with pt.      History of Present Illness: Krystal [name] is a 71 y.o. female who presents for a second opinion regarding management of atrial fibrillation.  She is transferring care from Dr. Clayborn Bigness.  She has history of recently diagnosed atrial fibrillation/flutter, essential hypertension, hyperlipidemia, s breast cancer status post bilateral mastectomy and chemotherapy, GERD, depression, fibromyalgia, arthritis, obstructive sleep apnea on CPAP and lymphedema. She was hospitalized at [facility] in August.  She was found by her husband on the floor with diarrhea and had mental status change.  She was noted to be in atrial fibrillation with rapid ventricular response.  Labs showed acute kidney injury and rhabdomyolysis.  She was found to have E. coli UTI.  Atrial fibrillation was treated with rate control with diltiazem.  She was started on anticoagulation with Eliquis.  Echocardiogram showed normal LV systolic function with mild to moderate mitral regurgitation.  Cardioversion was recently recommended.  The patient wanted a second opinion as she felt that her palpitations were intermittent and not persistent.  She is actually noted to be in sinus rhythm today.  She does report significant exertional dyspnea but no chest pain. She is a lifelong non-smoker.  Past Medical History:  Diagnosis Date   Anemia    Arthritis    osteo   Blindness of right eye at birth   Cancer [facility]) 2011   Left breast 2011 and cervical age 79   Carpal tunnel syndrome    Cataract    Depression    Edema 07/27/2011   Fibromyalgia    muscle weakness and pain   GERD (gastroesophageal reflux disease)    Hyperlipidemia    Hypertension    benign   Lymphedema     Osteoporosis    Plantar fasciitis    Pre-diabetes    Raynaud's disease    Restless leg syndrome    Rosacea    Rotator cuff rupture    Sleep apnea    Synovitis    Ulcer     Past Surgical History:  Procedure Laterality Date   ABDOMINAL HYSTERECTOMY  1986   For bleeding and pain   BLADDER SURGERY  2006   Bladder tack   BREAST RECONSTRUCTION Bilateral 09/17/2012   Procedure: BREAST RECONSTRUCTION;  Surgeon: Theodoro Kos, DO;  Location: [place];  Service: Plastics;  Laterality: Bilateral;  BILATERAL BREAST RECONSTRUCTION WITH TISSUE EXPANDERS AND ALLOMED   BREAST RECONSTRUCTION Right 06/03/2013   Procedure: RIGHT BREAST CAPSULE CONSTRACTURE;  Surgeon: Theodoro Kos, DO;  Location: [place];  Service: Plastics;  Laterality: Right;   CARPAL TUNNEL RELEASE Bilateral 2008   EYE SURGERY Right    INJECTION KNEE Left 08/12/2018   Procedure: KNEE INJECTION-LEFT;  Surgeon: Corky Mull, MD;  Location: [facility];  Service: Orthopedics;  Laterality: Left;   JOINT REPLACEMENT     LIPOSUCTION Bilateral 01/29/2013   Procedure: LIPOSUCTION;  Surgeon: Theodoro Kos, DO;  Location: [place];  Service: Plastics;  Laterality: Bilateral;   LIPOSUCTION Right 06/03/2013   Procedure: LIPOSUCTION;  Surgeon: Theodoro Kos, DO;  Location: [place];  Service: Plastics;  Laterality: Right;   MASTECTOMY  2012   rt  prophalactic mast-snbx   MASTECTOMY MODIFIED RADICAL  2012   left-axillary nodes   NEUROMA SURGERY Bilateral    NOSE SURGERY  2007   PORT-A-CATH REMOVAL     insertion and   THROAT SURGERY     TONSILLECTOMY     TOTAL KNEE ARTHROPLASTY Right 08/12/2018   Procedure: TOTAL KNEE ARTHROPLASTY-RIGHT;  Surgeon: Corky Mull, MD;  Location: [facility];  Service: Orthopedics;  Laterality: Right;   TOTAL KNEE ARTHROPLASTY Left 09/22/2019   Procedure: TOTAL KNEE ARTHROPLASTY;  Surgeon: Corky Mull, MD;  Location: [facility];  Service:  Orthopedics;  Laterality: Left;   UVULOPALATOPHARYNGOPLASTY       Current Outpatient Medications  Medication Sig Dispense Refill   albuterol (VENTOLIN HFA) 108 (90 Base) MCG/ACT inhaler Inhale 2 puffs into the lungs every 6 (six) hours as needed for wheezing or shortness of breath. 8 g 2   alendronate (FOSAMAX) 70 MG tablet Take 70 mg by mouth every Saturday. Take with a full glass of water on an empty stomach.      apixaban (ELIQUIS) 5 MG TABS tablet Take 1 tablet (5 mg total) by mouth 2 (two) times daily. 60 tablet 2   cetirizine (ZYRTEC) 10 MG tablet Take 10 mg by mouth daily.     cholecalciferol (VITAMIN D3) 25 MCG (1000 UT) tablet Take 1,000 Units by mouth every evening.      diltiazem (CARDIZEM CD) 240 MG 24 hr capsule Take 1 capsule (240 mg total) by mouth daily. 30 capsule 1   fluticasone (FLONASE) 50 MCG/ACT nasal spray Place 2 sprays into both nostrils daily.     folic acid (FOLVITE) 1 MG tablet Take 1 tablet (1 mg total) by mouth daily. 90 tablet 0   furosemide (LASIX) 20 MG tablet Take 20 mg by mouth 2 (two) times daily.      Glucosamine-Chondroitin (COSAMIN DS PO) Take 1 tablet by mouth 3 (three) times a week. Take as needed     LYSINE ACETATE PO Take 4,000 mg by mouth daily as needed (cold sores).      magnesium oxide (MAG-OX) 400 MG tablet Take 400 mg by mouth daily as needed (leg cramps).     omeprazole (PRILOSEC) 20 MG capsule Take 20 mg by mouth daily.     potassium chloride (KLOR-CON) 10 MEQ tablet Take 10 mEq by mouth in the morning and at bedtime.     pregabalin (LYRICA) 300 MG capsule Take 300 mg by mouth 2 (two) times daily.       rOPINIRole (REQUIP) 1 MG tablet Take 1 mg by mouth at bedtime.      triamcinolone (KENALOG) 0.025 % cream APPLY 1 APPLICATION TOPICALLY DAILY AS NEEDED (RASH). 30 g 0   Vibegron (GEMTESA) 75 MG TABS Take 75 mg by mouth daily. 28 tablet 0   zolpidem (AMBIEN) 10 MG tablet Take 10 mg by mouth at bedtime.     No current facility-administered  medications for this visit.    Allergies:   Cephalexin and Levaquin [levofloxacin]    Social History:  The patient  reports that she has never smoked. She has never been exposed to tobacco smoke. She has never used smokeless tobacco. She reports current alcohol use. She reports that she does not use drugs.   Family History:  The patient's family history includes Breast cancer in her maternal aunt and maternal aunt; Cancer in her cousin, cousin, father, maternal grandfather, and maternal grandmother; Cancer (age of onset: 28) in her sister; Cancer (  age of onset: 10) in her mother; Hypertension in her brother and sister; Liver cancer in her maternal uncle; Lung cancer in her father and maternal uncle; Throat cancer in her father and maternal uncle.    ROS:  Please see the history of present illness.   Otherwise, review of systems are positive for none.   All other systems are reviewed and negative.    PHYSICAL EXAM: VS:  BP 138/80 (BP Location: Left Arm, Patient Position: Sitting, Cuff Size: Large)   Pulse 93   Ht '5\' 3"'$  (1.6 m)   Wt 197 lb 2 oz (89.4 kg)   SpO2 98%   BMI 34.92 kg/m  , BMI Body mass index is 34.92 kg/m. GEN: Well nourished, well developed, in no acute distress  HEENT: normal  Neck: no JVD, carotid bruits, or masses Cardiac: RRR; no murmurs, rubs, or gallops,no edema  Respiratory:  clear to auscultation bilaterally, normal work of breathing GI: soft, nontender, nondistended, + BS MS: no deformity or atrophy  Skin: warm and dry, no rash Neuro:  Strength and sensation are intact Psych: euthymic mood, full affect   EKG:  EKG is ordered today. The ekg ordered today demonstrates normal sinus rhythm with low voltage.   Recent Labs: 02/27/2022: ALT 30 02/28/2022: B Natriuretic Peptide 562.4 03/02/2022: BUN 13; Creatinine, Ser 1.11; Hemoglobin 12.5; Magnesium 1.6; Platelets 140; Potassium 3.6; Sodium 137    Lipid Panel    Component Value Date/Time   CHOL 197 04/03/2019  1026   TRIG 116 04/03/2019 1026   HDL 50 04/03/2019 1026   CHOLHDL 3.9 04/03/2019 1026   VLDL 23 04/03/2019 1026   LDLCALC 124 (H) 04/03/2019 1026      Wt Readings from Last 3 Encounters:  04/24/22 197 lb 2 oz (89.4 kg)  04/16/22 197 lb 6.4 oz (89.5 kg)  04/12/22 197 lb 9.6 oz (89.6 kg)          04/24/2022    2:24 PM  PAD Screen  Previous PAD dx? Yes  Previous surgical procedure? No  Pain with walking? No  Feet/toe relief with dangling? No  Painful, non-healing ulcers? No  Extremities discolored? No      ASSESSMENT AND PLAN:  1.  Paroxysmal atrial fibrillation: She is currently in normal sinus rhythm.  No indications for cardioversion at the present time.  Continue diltiazem.  CHA2DS2-VASc score is 3.  I agree with long-term anticoagulation with Eliquis.  She continues to complain of frequent palpitations and tachycardia.  Thus, I requested a 2 weeks ZIO monitor to evaluate her A-fib burden.  If the burden is high, she will most likely require an antiarrhythmic medication or consideration of ablation which at the present time she wants to avoid.  She might be a good candidate for class Ic drugs.  Amiodarone was considered few months ago but her LFTs were abnormal.  2.  Significant exertional dyspnea: We have to exclude angina equivalent.  I requested a Lexiscan Myoview.  She is not able to exercise on a treadmill.  3.  Essential hypertension: Blood pressures controlled on current medications.    Disposition:    Follow-up in 1 month after cardiac testing.  Signed,  Kathlyn Sacramento, MD  04/24/2022 2:44 PM    [place]

## 2022-04-25 NOTE — Telephone Encounter (Signed)
Spoke with patient and she understood

## 2022-04-30 ENCOUNTER — Encounter
Admission: RE | Admit: 2022-04-30 | Discharge: 2022-04-30 | Disposition: A | Discharge status: Home / Self Care | Payer: Medicare HMO | Source: Ambulatory Visit | Attending: Cardiovascular Disease | Admitting: Cardiovascular Disease

## 2022-04-30 DIAGNOSIS — R0609 Other forms of dyspnea: Secondary | ICD-10-CM | POA: Diagnosis not present

## 2022-04-30 LAB — NM MYOCAR MULTI W/SPECT W/WALL MOTION / EF
LV dias vol: 53 mL (ref 46–106)
LV sys vol: 17 mL
Nuc Stress EF: 68 %
Peak HR: 103 {beats}/min
Percent HR: 68 %
Rest HR: 82 {beats}/min
Rest Nuclear Isotope Dose: 10.4 mCi
SDS: 2
SRS: 3
SSS: 2
ST Depression (mm): 0 mm
Stress Nuclear Isotope Dose: 29.8 mCi
TID: 0.71

## 2022-04-30 MED ORDER — TECHNETIUM TC 99M TETROFOSMIN IV KIT
10.3500 | PACK | Freq: Once | INTRAVENOUS | Status: AC | PRN
  Administered 2022-04-30: 10.35 via INTRAVENOUS

## 2022-04-30 MED ORDER — TECHNETIUM TC 99M TETROFOSMIN IV KIT
29.8100 | PACK | Freq: Once | INTRAVENOUS | Status: AC | PRN
  Administered 2022-04-30: 29.81 via INTRAVENOUS

## 2022-04-30 MED ORDER — REGADENOSON 0.4 MG/5ML IV SOLN
0.4000 mg | Freq: Once | INTRAVENOUS | Status: AC
  Administered 2022-04-30: 0.4 mg via INTRAVENOUS

## 2022-05-03 ENCOUNTER — Encounter: Payer: Self-pay | Admitting: *Deleted

## 2022-05-07 DIAGNOSIS — R7989 Other specified abnormal findings of blood chemistry: Secondary | ICD-10-CM | POA: Diagnosis not present

## 2022-05-10 DIAGNOSIS — J302 Other seasonal allergic rhinitis: Secondary | ICD-10-CM | POA: Diagnosis not present

## 2022-05-10 DIAGNOSIS — J018 Other acute sinusitis: Secondary | ICD-10-CM | POA: Diagnosis not present

## 2022-05-23 ENCOUNTER — Ambulatory Visit: Payer: Medicare HMO | Attending: Medical

## 2022-05-23 ENCOUNTER — Other Ambulatory Visit: Payer: Self-pay | Admitting: *Deleted

## 2022-05-23 ENCOUNTER — Telehealth: Payer: Self-pay | Admitting: *Deleted

## 2022-05-23 ENCOUNTER — Ambulatory Visit: Payer: Medicare HMO | Admitting: Medical

## 2022-05-23 DIAGNOSIS — I480 Paroxysmal atrial fibrillation: Secondary | ICD-10-CM

## 2022-05-23 NOTE — Progress Notes (Deleted)
Cardiology Office Note:    Date:  05/23/2022   ID:  Krystal [name], DOB Jan 26, 1951, MRN [id number]  PCP:  [name], MD  [facility] Cardiologist:  None  [facility] Electrophysiologist:  None   Referring MD: [name], MD   Chief Complaint: heart monitor results  History of Present Illness:    Krystal [name] is a 71 y.o. female with a hx of atrial fibrillation, hypertension, hyperlipidemia, breast cancer status post bilateral mastectomy and chemotherapy, GERD, depression, fibromyalgia, arthritis, OSA on CPAP, lymphedema who presents for heart monitor results.  Patient was hospitalized at [facility] in August 2023.  She was found by her husband on the floor with diarrhea and had mental status changes.  She was noted to be in rapid A-fib.  Labs showed AKI and rhabdomyolysis.  She was found to have E. coli UTI.  Patient was started on diltiazem for rate control and Eliquis for anticoagulation.  Echocardiogram showed normal LV systolic function and mild to moderate mitral regurgitation.  Cardioversion was recommended, however she wanted a second opinion.  Seen 04/24/2022 and was in normal sinus rhythm.  CHA2DS2-VASc of 3.  No indication for cardioversion at the present time.  2-week heart monitor was requested.  Today,  Past Medical History:  Diagnosis Date   Anemia    Arthritis    osteo   Blindness of right eye at birth   Cancer [facility]) 2011   Left breast 2011 and cervical age 50   Carpal tunnel syndrome    Cataract    Depression    Edema 07/27/2011   Fibromyalgia    muscle weakness and pain   GERD (gastroesophageal reflux disease)    Hyperlipidemia    Hypertension    benign   Lymphedema    Osteoporosis    Plantar fasciitis    Pre-diabetes    Raynaud's disease    Restless leg syndrome    Rosacea    Rotator cuff rupture    Sleep apnea    Synovitis    Ulcer     Past Surgical History:  Procedure Laterality Date   ABDOMINAL HYSTERECTOMY  1986   For bleeding  and pain   BLADDER SURGERY  2006   Bladder tack   BREAST RECONSTRUCTION Bilateral 09/17/2012   Procedure: BREAST RECONSTRUCTION;  Surgeon: Theodoro Kos, DO;  Location: [place];  Service: Plastics;  Laterality: Bilateral;  BILATERAL BREAST RECONSTRUCTION WITH TISSUE EXPANDERS AND ALLOMED   BREAST RECONSTRUCTION Right 06/03/2013   Procedure: RIGHT BREAST CAPSULE CONSTRACTURE;  Surgeon: Theodoro Kos, DO;  Location: [place];  Service: Plastics;  Laterality: Right;   CARPAL TUNNEL RELEASE Bilateral 2008   EYE SURGERY Right    INJECTION KNEE Left 08/12/2018   Procedure: KNEE INJECTION-LEFT;  Surgeon: Corky Mull, MD;  Location: [facility];  Service: Orthopedics;  Laterality: Left;   JOINT REPLACEMENT     LIPOSUCTION Bilateral 01/29/2013   Procedure: LIPOSUCTION;  Surgeon: Theodoro Kos, DO;  Location: [place];  Service: Plastics;  Laterality: Bilateral;   LIPOSUCTION Right 06/03/2013   Procedure: LIPOSUCTION;  Surgeon: Theodoro Kos, DO;  Location: [place];  Service: Plastics;  Laterality: Right;   MASTECTOMY  2012   rt prophalactic mast-snbx   MASTECTOMY MODIFIED RADICAL  2012   left-axillary nodes   NEUROMA SURGERY Bilateral    NOSE SURGERY  2007   PORT-A-CATH REMOVAL     insertion and   THROAT SURGERY  TONSILLECTOMY     TOTAL KNEE ARTHROPLASTY Right 08/12/2018   Procedure: TOTAL KNEE ARTHROPLASTY-RIGHT;  Surgeon: Corky Mull, MD;  Location: [facility];  Service: Orthopedics;  Laterality: Right;   TOTAL KNEE ARTHROPLASTY Left 09/22/2019   Procedure: TOTAL KNEE ARTHROPLASTY;  Surgeon: Corky Mull, MD;  Location: [facility];  Service: Orthopedics;  Laterality: Left;   UVULOPALATOPHARYNGOPLASTY      Current Medications: No outpatient medications have been marked as taking for the 05/23/22 encounter (Appointment) with Kathlen Mody, Fortune Torosian H, PA-C.     Allergies:   Cephalexin and Levaquin [levofloxacin]   Social History    Socioeconomic History   Marital status: Married    Spouse name: Not on file   Number of children: Not on file   Years of education: Not on file   Highest education level: Not on file  Occupational History   Not on file  Tobacco Use   Smoking status: Never    Passive exposure: Never   Smokeless tobacco: Never  Vaping Use   Vaping Use: Never used  Substance and Sexual Activity   Alcohol use: Yes    Comment: 16 oz per day wine   Drug use: No   Sexual activity: Yes  Other Topics Concern   Not on file  Social History Narrative   Not on file   Social Determinants of Health   Financial Resource Strain: Not on file  Food Insecurity: Not on file  Transportation Needs: Not on file  Physical Activity: Not on file  Stress: Not on file  Social Connections: Not on file     Family History: The patient's family history includes Breast cancer in her maternal aunt and maternal aunt; Cancer in her cousin, cousin, father, maternal grandfather, and maternal grandmother; Cancer (age of onset: 15) in her sister; Cancer (age of onset: 45) in her mother; Hypertension in her brother and sister; Liver cancer in her maternal uncle; Lung cancer in her father and maternal uncle; Throat cancer in her father and maternal uncle.  ROS:   Please see the history of present illness.     All other systems reviewed and are negative.  EKGs/Labs/Other Studies Reviewed:    The following studies were reviewed today:  Echocardiogram 02/2022   1. Left ventricular ejection fraction, by estimation, is 65 to 70%. The  left ventricle has normal function. The left ventricle has no regional  wall motion abnormalities. Left ventricular diastolic parameters were  normal.   2. Right ventricular systolic function is normal. The right ventricular  size is normal.   3. The mitral valve is normal in structure. Mild to moderate mitral valve  regurgitation.   4. Tricuspid valve regurgitation is mild to moderate.   5.  The aortic valve is normal in structure. Aortic valve regurgitation is  not visualized.  EKG:  EKG is *** ordered today.  The ekg ordered today demonstrates ***  Recent Labs: 02/27/2022: ALT 30 02/28/2022: B Natriuretic Peptide 562.4 03/02/2022: BUN 13; Creatinine, Ser 1.11; Hemoglobin 12.5; Magnesium 1.6; Platelets 140; Potassium 3.6; Sodium 137  Recent Lipid Panel    Component Value Date/Time   CHOL 197 04/03/2019 1026   TRIG 116 04/03/2019 1026   HDL 50 04/03/2019 1026   CHOLHDL 3.9 04/03/2019 1026   VLDL 23 04/03/2019 1026   LDLCALC 124 (H) 04/03/2019 1026     Risk Assessment/Calculations:   {Does this patient have ATRIAL FIBRILLATION?:[id number]}   Physical Exam:    VS:  There were no vitals taken  for this visit.    Wt Readings from Last 3 Encounters:  04/24/22 197 lb 2 oz (89.4 kg)  04/16/22 197 lb 6.4 oz (89.5 kg)  04/12/22 197 lb 9.6 oz (89.6 kg)     GEN: *** Well nourished, well developed in no acute distress HEENT: Normal NECK: No JVD; No carotid bruits LYMPHATICS: No lymphadenopathy CARDIAC: ***RRR, no murmurs, rubs, gallops RESPIRATORY:  Clear to auscultation without rales, wheezing or rhonchi  ABDOMEN: Soft, non-tender, non-distended MUSCULOSKELETAL:  No edema; No deformity  SKIN: Warm and dry NEUROLOGIC:  Alert and oriented x 3 PSYCHIATRIC:  Normal affect   ASSESSMENT:    No diagnosis found. PLAN:    In order of problems listed above:  ***  Disposition: Follow up {follow up:15908} with ***   Shared Decision Making/Informed Consent   {Are you ordering a CV Procedure (e.g. stress test, cath, DCCV, TEE, etc)?   Press F2        :[id number]}    Signed, Mollee Neer Ninfa Meeker, PA-C  05/23/2022 8:14 AM    [facility]

## 2022-05-23 NOTE — Telephone Encounter (Signed)
[name] to discuss lost zio monitor and that Cadence would like to order a new monitor to do a repeat study.

## 2022-05-25 ENCOUNTER — Other Ambulatory Visit: Payer: Self-pay | Admitting: *Deleted

## 2022-05-25 NOTE — Telephone Encounter (Signed)
I spoke with pt concerning lost zio monitor she was very understanding and pleasant. She is aware that she will be receiving a new monitor via mail and understands to place monitor on as soon as she receives the monitor. She had no questions or concerns at this time.

## 2022-05-25 NOTE — Telephone Encounter (Signed)
[name] to contact office

## 2022-05-25 NOTE — Telephone Encounter (Signed)
Patient returning call.

## 2022-05-26 DIAGNOSIS — I480 Paroxysmal atrial fibrillation: Secondary | ICD-10-CM | POA: Diagnosis not present

## 2022-05-29 DIAGNOSIS — Z79899 Other long term (current) drug therapy: Secondary | ICD-10-CM | POA: Diagnosis not present

## 2022-06-07 DIAGNOSIS — J302 Other seasonal allergic rhinitis: Secondary | ICD-10-CM | POA: Diagnosis not present

## 2022-06-07 DIAGNOSIS — K219 Gastro-esophageal reflux disease without esophagitis: Secondary | ICD-10-CM | POA: Diagnosis not present

## 2022-06-20 DIAGNOSIS — I480 Paroxysmal atrial fibrillation: Secondary | ICD-10-CM | POA: Diagnosis not present

## 2022-06-25 ENCOUNTER — Telehealth: Payer: Self-pay | Admitting: Cardiovascular Disease

## 2022-06-25 ENCOUNTER — Encounter: Payer: Self-pay | Admitting: Medical

## 2022-06-25 ENCOUNTER — Ambulatory Visit: Payer: Medicare HMO | Attending: Medical | Admitting: Medical

## 2022-06-25 VITALS — BP 146/86 | HR 81 | Ht 63.0 in | Wt 184.2 lb

## 2022-06-25 DIAGNOSIS — R0609 Other forms of dyspnea: Secondary | ICD-10-CM | POA: Diagnosis not present

## 2022-06-25 DIAGNOSIS — I480 Paroxysmal atrial fibrillation: Secondary | ICD-10-CM

## 2022-06-25 DIAGNOSIS — I10 Essential (primary) hypertension: Secondary | ICD-10-CM | POA: Diagnosis not present

## 2022-06-25 MED ORDER — DILTIAZEM HCL ER COATED BEADS 360 MG PO CP24: 360.0000 mg | ORAL_CAPSULE | Freq: Every day | ORAL | 3 refills | Status: DC

## 2022-06-25 NOTE — Telephone Encounter (Signed)
Patient would like to confirm that her monitor results have been received. Patient states that previously her heart monitor was lost in the mail and she showed up for her follow up appointment and had to reschedule because the results were never received. Patient is scheduled for this afternoon at 2:20 PM. Please advise.

## 2022-06-25 NOTE — Patient Instructions (Signed)
Medication Instructions:  INCREASE the Diltiazem to 360 mg once daily  *If you need a refill on your cardiac medications before your next appointment, please call your pharmacy*   Lab Work: None ordered If you have labs (blood work) drawn today and your tests are completely normal, you will receive your results only by: [place] (if you have MyChart) OR A paper copy in the mail If you have any lab test that is abnormal or we need to change your treatment, we will call you to review the results.   Testing/Procedures: None ordered   Follow-Up: At [facility], you and your health needs are our priority.  As part of our continuing mission to provide you with exceptional heart care, we have created designated Provider Care Teams.  These Care Teams include your primary Cardiologist (physician) and Advanced Practice Providers (APPs -  Physician Assistants and Nurse Practitioners) who all work together to provide you with the care you need, when you need it.  We recommend signing up for the patient portal called "MyChart".  Sign up information is provided on this After Visit Summary.  MyChart is used to connect with patients for Virtual Visits (Telemedicine).  Patients are able to view lab/test results, encounter notes, upcoming appointments, etc.  Non-urgent messages can be sent to your provider as well.   To learn more about what you can do with MyChart, go to [email or web address].    Your next appointment:   3 month(s)  The format for your next appointment:   In Person  Provider:   You may see Dr. Fletcher Anon or one of the following Advanced Practice Providers on your designated Care Team:   Murray Hodgkins, NP Christell Faith, PA-C Cadence Kathlen Mody, PA-C Gerrie Nordmann, NP    Other Instructions A referral has been placed to our Electrophysiologist   Important Information About Sugar

## 2022-06-25 NOTE — Telephone Encounter (Signed)
Patient has been made aware that the results are in. She has an appointment this afternoon.

## 2022-06-25 NOTE — Progress Notes (Signed)
Cardiology Office Note:    Date:  06/25/2022   ID:  ALTHA SWEITZER, DOB [birth year], MRN [id number]  PCP:  [name], MD  [facility] Cardiologist:  None  [facility] Electrophysiologist:  None   Referring MD: [name], MD   Chief Complaint: 2 month follow-up  History of Present Illness:    Krystal [name] is a 71 y.o. female with a hx of afib/flutter, HTN, HLD, breast cancer, s/p bilateral mastectomy and chemotherapy, GERD, tobacco use, depression, fibromyalgia, arthritis, OSA on CPAP and lymphedema.   She was hospitalized in August 2023 with diarrhea and mental status changes and was found to be in afib/flutter. Labs showed AKI and rhabdomylysis. She was found to have E.Coli UTI. Afib was treated with diltiazem and she was started on Eliquis. Echo showed normal LVEF with mild to moderate MR. Cardioversion was recommended, but she was wanted a second opinion.   She saw Dr. Fletcher Anon 04/24/22 for a second opinion regarding Afib/flutter. She was in NSR. CHADSVASC of 3. A heart monitor was ordered. She also reported DOE and a Myoview lexiscan was ordered.   Heart monitor showed NSR, 1st degree AV block, 2 runs of NSVT longest 7 beats, afib/flutter with 29% burden, afib/flutter detected within 45 seconds of symptoms.   Myoview Lexiscan showed no evidence of ischemia, low risk study, CT imaging showed mild aortic calcifications.  Today, the heart monitor and myoview lexiscan reviewed. The patient reports when she is in afib she feels heart racing and heart pounding. Also feels some chest discomfort and mild shortness of breath. She denies ligtheadedness or dizziness related to afib.   Past Medical History:  Diagnosis Date   Anemia    Arthritis    osteo   Blindness of right eye at birth   Cancer [facility]) 2011   Left breast 2011 and cervical age 62   Carpal tunnel syndrome    Cataract    Depression    Edema 07/27/2011   Fibromyalgia    muscle weakness and pain   GERD  (gastroesophageal reflux disease)    Hyperlipidemia    Hypertension    benign   Lymphedema    Osteoporosis    Plantar fasciitis    Pre-diabetes    Raynaud's disease    Restless leg syndrome    Rosacea    Rotator cuff rupture    Sleep apnea    Synovitis    Ulcer     Past Surgical History:  Procedure Laterality Date   ABDOMINAL HYSTERECTOMY  1986   For bleeding and pain   BLADDER SURGERY  2006   Bladder tack   BREAST RECONSTRUCTION Bilateral 09/17/2012   Procedure: BREAST RECONSTRUCTION;  Surgeon: Theodoro Kos, DO;  Location: [place];  Service: Plastics;  Laterality: Bilateral;  BILATERAL BREAST RECONSTRUCTION WITH TISSUE EXPANDERS AND ALLOMED   BREAST RECONSTRUCTION Right 06/03/2013   Procedure: RIGHT BREAST CAPSULE CONSTRACTURE;  Surgeon: Theodoro Kos, DO;  Location: [place];  Service: Plastics;  Laterality: Right;   CARPAL TUNNEL RELEASE Bilateral 2008   EYE SURGERY Right    INJECTION KNEE Left 08/12/2018   Procedure: KNEE INJECTION-LEFT;  Surgeon: Corky Mull, MD;  Location: [facility];  Service: Orthopedics;  Laterality: Left;   JOINT REPLACEMENT     LIPOSUCTION Bilateral 01/29/2013   Procedure: LIPOSUCTION;  Surgeon: Theodoro Kos, DO;  Location: [place];  Service: Plastics;  Laterality: Bilateral;   LIPOSUCTION Right 06/03/2013   Procedure: LIPOSUCTION;  Surgeon:  Claire Sanger, DO;  Location: [place];  Service: Plastics;  Laterality: Right;   MASTECTOMY  2012   rt prophalactic mast-snbx   MASTECTOMY MODIFIED RADICAL  2012   left-axillary nodes   NEUROMA SURGERY Bilateral    NOSE SURGERY  2007   PORT-A-CATH REMOVAL     insertion and   THROAT SURGERY     TONSILLECTOMY     TOTAL KNEE ARTHROPLASTY Right 08/12/2018   Procedure: TOTAL KNEE ARTHROPLASTY-RIGHT;  Surgeon: Corky Mull, MD;  Location: [facility];  Service: Orthopedics;  Laterality: Right;   TOTAL KNEE ARTHROPLASTY Left 09/22/2019    Procedure: TOTAL KNEE ARTHROPLASTY;  Surgeon: Corky Mull, MD;  Location: [facility];  Service: Orthopedics;  Laterality: Left;   UVULOPALATOPHARYNGOPLASTY      Current Medications: Current Meds  Medication Sig   albuterol (VENTOLIN HFA) 108 (90 Base) MCG/ACT inhaler Inhale 2 puffs into the lungs every 6 (six) hours as needed for wheezing or shortness of breath.   alendronate (FOSAMAX) 70 MG tablet Take 70 mg by mouth every Saturday. Take with a full glass of water on an empty stomach.    apixaban (ELIQUIS) 5 MG TABS tablet Take 1 tablet (5 mg total) by mouth 2 (two) times daily.   cetirizine (ZYRTEC) 10 MG tablet Take 10 mg by mouth daily.   DULoxetine (CYMBALTA) 60 MG capsule Take 60 mg by mouth daily.   fluticasone (FLONASE) 50 MCG/ACT nasal spray Place 2 sprays into both nostrils daily.   folic acid (FOLVITE) 1 MG tablet Take 1 tablet (1 mg total) by mouth daily.   furosemide (LASIX) 20 MG tablet Take 20 mg by mouth 2 (two) times daily.    Glucosamine-Chondroitin (COSAMIN DS PO) Take 1 tablet by mouth 3 (three) times a week. Take as needed   levocetirizine (XYZAL) 5 MG tablet Take 5 mg by mouth daily.   LYSINE ACETATE PO Take 4,000 mg by mouth daily as needed (cold sores).    magnesium oxide (MAG-OX) 400 MG tablet Take 400 mg by mouth daily as needed (leg cramps).   meloxicam (MOBIC) 7.5 MG tablet Take 7.5 mg by mouth daily.   omeprazole (PRILOSEC) 20 MG capsule Take 20 mg by mouth daily.   potassium chloride (KLOR-CON) 10 MEQ tablet Take 10 mEq by mouth in the morning and at bedtime.   pregabalin (LYRICA) 300 MG capsule Take 300 mg by mouth 2 (two) times daily.     rOPINIRole (REQUIP) 1 MG tablet Take 1 mg by mouth at bedtime.    triamcinolone (KENALOG) 0.025 % cream APPLY 1 APPLICATION TOPICALLY DAILY AS NEEDED (RASH).   Vibegron (GEMTESA) 75 MG TABS Take 75 mg by mouth daily.   Vitamin D, Ergocalciferol, (DRISDOL) 1.25 MG (50000 UNIT) CAPS capsule Take 50,000 Units by mouth once a  week.   zolpidem (AMBIEN) 10 MG tablet Take 10 mg by mouth at bedtime.   [DISCONTINUED] diltiazem (CARDIZEM CD) 240 MG 24 hr capsule Take 1 capsule (240 mg total) by mouth daily.     Allergies:   Cephalexin and Levaquin [levofloxacin]   Social History   Socioeconomic History   Marital status: Married    Spouse name: Not on file   Number of children: Not on file   Years of education: Not on file   Highest education level: Not on file  Occupational History   Not on file  Tobacco Use   Smoking status: Never    Passive exposure: Never   Smokeless  tobacco: Never  Vaping Use   Vaping Use: Never used  Substance and Sexual Activity   Alcohol use: Yes    Comment: 16 oz per day wine   Drug use: No   Sexual activity: Yes  Other Topics Concern   Not on file  Social History Narrative   Not on file   Social Determinants of Health   Financial Resource Strain: Not on file  Food Insecurity: Not on file  Transportation Needs: Not on file  Physical Activity: Not on file  Stress: Not on file  Social Connections: Not on file     Family History: The patient's family history includes Breast cancer in her maternal aunt and maternal aunt; Cancer in her cousin, cousin, father, maternal grandfather, and maternal grandmother; Cancer (age of onset: 61) in her sister; Cancer (age of onset: 67) in her mother; Hypertension in her brother and sister; Liver cancer in her maternal uncle; Lung cancer in her father and maternal uncle; Throat cancer in her father and maternal uncle.  ROS:   Please see the history of present illness.     All other systems reviewed and are negative.  EKGs/Labs/Other Studies Reviewed:    The following studies were reviewed today:  Heart monitor 04/2022 Patch Wear Time:  13 days and 15 hours (2023-11-04T15:03:51-0400 to 2023-11-18T05:38:11-498)   Patient had a min HR of 56 bpm, max HR of 194 bpm, and avg HR of 90 bpm. Predominant underlying rhythm was Sinus Rhythm.  First Degree AV Block was present. 2 Ventricular Tachycardia runs occurred, the run with the fastest interval lasting 7 beats with a  max rate of 193 bpm (avg 179 bpm); the run with the fastest interval was also the longest. Atrial Fibrillation/Flutter occurred (29% burden), ranging from 56-194 bpm (avg of 105 bpm), the longest lasting 1 day 6 hours with an avg rate of 114 bpm. Atrial  Fibrillation was present at activation of device. Atrial Fibrillation/Flutter was detected within +/- 45 seconds of symptomatic patient event(s). Isolated SVEs were rare (<1.0%), SVE Couplets were rare (<1.0%), and SVE Triplets were rare (<1.0%).  Isolated VEs were rare (<1.0%, 6251), VE Couplets were rare (<1.0%, 28), and VE Triplets were rare (<1.0%, 2). Ventricular Trigeminy was present.  Myoview Lexiscan 04/2022 Narrative & Impression      The study is normal. The study is low risk.   No ST deviation was noted.   LV perfusion is normal. There is no evidence of ischemia. There is no evidence of infarction.   Left ventricular function is normal. End diastolic cavity size is normal. End systolic cavity size is normal.   CT attenuation images showed mild aortic calcifications.    EKG:  EKG is  ordered today.  The ekg ordered today demonstrates NSR 81bpm, LAD, nonspecific T wave changes  Recent Labs: 02/27/2022: ALT 30 02/28/2022: B Natriuretic Peptide 562.4 03/02/2022: BUN 13; Creatinine, Ser 1.11; Hemoglobin 12.5; Magnesium 1.6; Platelets 140; Potassium 3.6; Sodium 137  Recent Lipid Panel    Component Value Date/Time   CHOL 197 04/03/2019 1026   TRIG 116 04/03/2019 1026   HDL 50 04/03/2019 1026   CHOLHDL 3.9 04/03/2019 1026   VLDL 23 04/03/2019 1026   LDLCALC 124 (H) 04/03/2019 1026    Physical Exam:    VS:  BP (!) 146/86 (BP Location: Right Arm, Patient Position: Sitting, Cuff Size: Normal)   Pulse 81   Ht '5\' 3"'$  (1.6 m)   Wt 184 lb 3.2 oz (83.6 kg)  SpO2 95%   BMI 32.63 kg/m     Wt Readings from  Last 3 Encounters:  06/25/22 184 lb 3.2 oz (83.6 kg)  04/24/22 197 lb 2 oz (89.4 kg)  04/16/22 197 lb 6.4 oz (89.5 kg)     GEN:  Well nourished, well developed in no acute distress HEENT: Normal NECK: No JVD; No carotid bruits LYMPHATICS: No lymphadenopathy CARDIAC: RRR, no murmurs, rubs, gallops RESPIRATORY:  Clear to auscultation without rales, wheezing or rhonchi  ABDOMEN: Soft, non-tender, non-distended MUSCULOSKELETAL:  No edema; No deformity  SKIN: Warm and dry NEUROLOGIC:  Alert and oriented x 3 PSYCHIATRIC:  Normal affect   ASSESSMENT:    1. PAF (paroxysmal atrial fibrillation) ([place])   2. DOE (dyspnea on exertion)   3. Essential hypertension    PLAN:    In order of problems listed above:  Paroxysmal Afib The patient is in NSR today with heart rate in the 80s. Heart monitor showed 29% afib burden, triggered events within 45 seconds of afib. She reports heart racing/pounding while in afib with some chest discomfort. I will increase Cardizem to '360mg'$  daily and refer to EP. Continue Eliquis '5mg'$  BID for stroke ppx.   DOE Patient reports no significant shortness of breath. Myoview lexiscan showed no ischemia, low risk, CT imaging with mild aortic calcifications. No further ischemic work-up at this time.   HTN BP mildly elevated today, increase diltiazem as above.   Disposition: Follow up in 3 month(s) with MD/APP   Signed, Issam Carlyon Ninfa Meeker, PA-C  06/25/2022 3:13 PM    [facility]

## 2022-06-28 ENCOUNTER — Encounter: Payer: Self-pay | Admitting: Internal Medicine

## 2022-06-28 ENCOUNTER — Ambulatory Visit: Payer: Medicare HMO | Attending: Internal Medicine | Admitting: Internal Medicine

## 2022-06-28 VITALS — BP 132/68 | HR 70 | Ht 63.0 in | Wt 188.4 lb

## 2022-06-28 DIAGNOSIS — I480 Paroxysmal atrial fibrillation: Secondary | ICD-10-CM | POA: Diagnosis not present

## 2022-06-28 DIAGNOSIS — I10 Essential (primary) hypertension: Secondary | ICD-10-CM

## 2022-06-28 DIAGNOSIS — R0609 Other forms of dyspnea: Secondary | ICD-10-CM | POA: Diagnosis not present

## 2022-06-28 MED ORDER — DILTIAZEM HCL ER COATED BEADS 120 MG PO CP24: ORAL_CAPSULE | ORAL | 3 refills | Status: DC

## 2022-06-28 MED ORDER — FLECAINIDE ACETATE 100 MG PO TABS: 100.0000 mg | ORAL_TABLET | Freq: Two times a day (BID) | ORAL | 6 refills | Status: DC

## 2022-06-28 NOTE — Progress Notes (Signed)
ELECTROPHYSIOLOGY CONSULT NOTE  Patient ID: Krystal [name], MRN: [id number], DOB/AGE: 1951-05-30 72 y.o. Admit date: (Not on file) Date of Consult: 06/28/2022  Primary Physician: Krystal Pink, MD Primary Cardiologist: MA     Krystal [name] is a 71 y.o. female who is being seen today for the evaluation of atrial fibrillation at the request of Dr. MA/CF.    HPI Krystal [name] is a 71 y.o. female referred for atrial fibrillation.   Initially identified 8/23 after she presented with urosepsis altered mental status rhabdomyolysis acute kidney injury.  Appropriately started on anticoagulation.  Has continued to have recurrences of atrial fibrillation.  Aware of it mostly having palpitations when recumbent.  She is also when she sits up she no longer feels her tachypalpitations.  Does have episodic Dyspnea associated with /lightheadedness this seems to be more common and has been before    Memory has been noted to change significantly following a, fall 2022 urosepsis 2023  Past medical history is notable for hypertension breast cancer with bilateral mastectomy and chemotherapy, treated sleep apnea, depression fibromyalgia.  She also has a bilateral lower extremity lymphedema and upper extremity lymphedema which in her mind is tied to her mastectomy surgery.  She uses compression pumps.  DATE TEST EF   8/23 Echo   65 % MR mild--Mod Normal LA size   10/23 Myoview      % No ischemia        Date Cr K Mg Hgb  8/23 1.11 3.6 1.6 12.5  11/23 0.9 3.6      Alcohol intake has been exuberant in the past at greater than 2 bottles a day of wine more recently about 4+ ounces a day   Thromboembolic risk factors ( age -64, HTN-1, Gender-1) for a CHADSVASc Score of >=3   Past Medical History:  Diagnosis Date   Anemia    Arthritis    osteo   Blindness of right eye at birth   Cancer [facility]) 2011   Left breast 2011 and cervical age 33   Carpal tunnel syndrome    Cataract    Depression     Edema 07/27/2011   Fibromyalgia    muscle weakness and pain   GERD (gastroesophageal reflux disease)    Hyperlipidemia    Hypertension    benign   Lymphedema    Osteoporosis    Plantar fasciitis    Pre-diabetes    Raynaud's disease    Restless leg syndrome    Rosacea    Rotator cuff rupture    Sleep apnea    Synovitis    Ulcer       Surgical History:  Past Surgical History:  Procedure Laterality Date   ABDOMINAL HYSTERECTOMY  1986   For bleeding and pain   BLADDER SURGERY  2006   Bladder tack   BREAST RECONSTRUCTION Bilateral 09/17/2012   Procedure: BREAST RECONSTRUCTION;  Surgeon: Theodoro Kos, DO;  Location: [place];  Service: Plastics;  Laterality: Bilateral;  BILATERAL BREAST RECONSTRUCTION WITH TISSUE EXPANDERS AND ALLOMED   BREAST RECONSTRUCTION Right 06/03/2013   Procedure: RIGHT BREAST CAPSULE CONSTRACTURE;  Surgeon: Theodoro Kos, DO;  Location: [place];  Service: Plastics;  Laterality: Right;   CARPAL TUNNEL RELEASE Bilateral 2008   EYE SURGERY Right    INJECTION KNEE Left 08/12/2018   Procedure: KNEE INJECTION-LEFT;  Surgeon: Corky Mull, MD;  Location: [facility];  Service: Orthopedics;  Laterality: Left;  JOINT REPLACEMENT     LIPOSUCTION Bilateral 01/29/2013   Procedure: LIPOSUCTION;  Surgeon: Theodoro Kos, DO;  Location: [place];  Service: Plastics;  Laterality: Bilateral;   LIPOSUCTION Right 06/03/2013   Procedure: LIPOSUCTION;  Surgeon: Theodoro Kos, DO;  Location: [place];  Service: Plastics;  Laterality: Right;   MASTECTOMY  2012   rt prophalactic mast-snbx   MASTECTOMY MODIFIED RADICAL  2012   left-axillary nodes   NEUROMA SURGERY Bilateral    NOSE SURGERY  2007   PORT-A-CATH REMOVAL     insertion and   THROAT SURGERY     TONSILLECTOMY     TOTAL KNEE ARTHROPLASTY Right 08/12/2018   Procedure: TOTAL KNEE ARTHROPLASTY-RIGHT;  Surgeon: Corky Mull, MD;  Location: [facility];   Service: Orthopedics;  Laterality: Right;   TOTAL KNEE ARTHROPLASTY Left 09/22/2019   Procedure: TOTAL KNEE ARTHROPLASTY;  Surgeon: Corky Mull, MD;  Location: [facility];  Service: Orthopedics;  Laterality: Left;   UVULOPALATOPHARYNGOPLASTY       Home Meds: Current Meds  Medication Sig   albuterol (VENTOLIN HFA) 108 (90 Base) MCG/ACT inhaler Inhale 2 puffs into the lungs every 6 (six) hours as needed for wheezing or shortness of breath.   alendronate (FOSAMAX) 70 MG tablet Take 70 mg by mouth every Saturday. Take with a full glass of water on an empty stomach.    apixaban (ELIQUIS) 5 MG TABS tablet Take 1 tablet (5 mg total) by mouth 2 (two) times daily.   cetirizine (ZYRTEC) 10 MG tablet Take 10 mg by mouth daily.   diltiazem (CARDIZEM CD) 360 MG 24 hr capsule Take 1 capsule (360 mg total) by mouth daily.   DULoxetine (CYMBALTA) 60 MG capsule Take 60 mg by mouth daily.   fluticasone (FLONASE) 50 MCG/ACT nasal spray Place 2 sprays into both nostrils daily.   folic acid (FOLVITE) 1 MG tablet Take 1 tablet (1 mg total) by mouth daily.   furosemide (LASIX) 20 MG tablet Take 20 mg by mouth 2 (two) times daily.    Glucosamine-Chondroitin (COSAMIN DS PO) Take 1 tablet by mouth 3 (three) times a week. Take as needed   levocetirizine (XYZAL) 5 MG tablet Take 5 mg by mouth daily.   LYSINE ACETATE PO Take 4,000 mg by mouth daily as needed (cold sores).    magnesium oxide (MAG-OX) 400 MG tablet Take 400 mg by mouth daily as needed (leg cramps).   meloxicam (MOBIC) 7.5 MG tablet Take 7.5 mg by mouth daily.   omeprazole (PRILOSEC) 20 MG capsule Take 20 mg by mouth 2 (two) times daily before a meal.   potassium chloride (KLOR-CON) 10 MEQ tablet Take 10 mEq by mouth in the morning and at bedtime.   pregabalin (LYRICA) 300 MG capsule Take 300 mg by mouth 2 (two) times daily.     rOPINIRole (REQUIP) 1 MG tablet Take 1 mg by mouth at bedtime.    triamcinolone (KENALOG) 0.025 % cream APPLY 1 APPLICATION  TOPICALLY DAILY AS NEEDED (RASH).   Vibegron (GEMTESA) 75 MG TABS Take 75 mg by mouth daily.   Vitamin D, Ergocalciferol, (DRISDOL) 1.25 MG (50000 UNIT) CAPS capsule Take 50,000 Units by mouth once a week.   zolpidem (AMBIEN) 10 MG tablet Take 10 mg by mouth at bedtime.    Allergies:  Allergies  Allergen Reactions   Cephalexin Nausea Only    Diaphoresis / Sweating (intolerance)   Levaquin [Levofloxacin] Other (See Comments)    Stiff joints, unable  to walk.    Social History   Socioeconomic History   Marital status: Married    Spouse name: Not on file   Number of children: Not on file   Years of education: Not on file   Highest education level: Not on file  Occupational History   Not on file  Tobacco Use   Smoking status: Never    Passive exposure: Never   Smokeless tobacco: Never  Vaping Use   Vaping Use: Never used  Substance and Sexual Activity   Alcohol use: Yes    Comment: 16 oz per day wine   Drug use: No   Sexual activity: Yes  Other Topics Concern   Not on file  Social History Narrative   Not on file   Social Determinants of Health   Financial Resource Strain: Not on file  Food Insecurity: Not on file  Transportation Needs: Not on file  Physical Activity: Not on file  Stress: Not on file  Social Connections: Not on file  Intimate Partner Violence: Not on file     Family History  Problem Relation Age of Onset   Cancer Mother 1       non hodgkins lymphoma   Cancer Father        neck, throat, lung   Lung cancer Father    Throat cancer Father    Hypertension Sister    Cancer Sister 49       Breast cancer dx'd 04/2011   Hypertension Brother    Breast cancer Maternal Aunt    Liver cancer Maternal Uncle    Cancer Maternal Grandmother        died 85, unknown cancer   Cancer Maternal Grandfather        died in his 63s; unknown cancer   Lung cancer Maternal Uncle    Throat cancer Maternal Uncle    Breast cancer Maternal Aunt    Cancer Cousin         died under the age 92, unknown cancer   Cancer Cousin        died age 11; unknown cancer     ROS:  Please see the history of present illness.     All other systems reviewed and negative.    Physical Exam: Blood pressure 132/68, pulse 70, height '5\' 3"'$  (1.6 m), weight 188 lb 6.4 oz (85.5 kg), SpO2 94 %. General: Well developed, well nourished female in no acute distress. Head: Normocephalic, atraumatic, sclera non-icteric, no xanthomas, nares are without discharge. EENT: normal  Lymph Nodes:  none Neck: Negative for carotid bruits. JVD not elevated. Back:without scoliosis kyphosis  Lungs: Clear bilaterally to auscultation without wheezes, rales, or rhonchi. Breathing is unlabored. Heart: RRR with S1 S2. No   murmur . No rubs, or gallops appreciated. Abdomen: Soft, non-tender, non-distended with normoactive bowel sounds. No hepatomegaly. No rebound/guarding. No obvious abdominal masses. Msk:  Strength and tone appear normal for age. Extremities: No clubbing or cyanosis. No  edema.  Distal pedal pulses are 2+ and equal bilaterally. Skin: Warm and Dry Neuro: Alert and oriented X 3. CN III-XII intact Grossly normal sensory and motor function . Psych:  Responds to questions appropriately with a normal affect.       Select: Take EKG: Sinus at 66 0 40/09/47 Otherwise normal   Assessment and Plan:  Atrial fibrillation-paroxysmal  First-degree AV block  History of excessive alcohol intake  Memory issues  Bilateral hand dexterity weakness   Patient has atrial fibrillation-paroxysmal with few  symptoms to clearly delineate the frequency of atrial fibrillation but episodic dyspnea associate with lightheadedness which I suspect is A-fib associated with rapid ventricular rate.  Diltiazem was appropriately increased, we will decrease it from 360 however--120 twice daily and in the short-term we will begin her on flecainide 100 twice daily to try to control her atrial fibrillation.  Will  have her come back in in 2 weeks to review her PR interval and QRS duration within target the patient has been combined less than 360 ms; there may be some issues related to her PR interval  Encouraged her decreasing her sodium intake  Encouraged him to follow-up with Dr. Kary Kos regarding A-worsening memory issues and early onset?  Dementia.  B-cervical spine issues with bilateral dexterity challenges in her hands.  C-palliative care support for her husband's caring for her   Virl Axe

## 2022-06-28 NOTE — Patient Instructions (Addendum)
Medication Instructions:  - Your physician has recommended you make the following change in your medication:   1) START Flecainide 100 mg: - take 1 tablet by mouth TWICE daily (or every 12 hours) (Sent to the local pharmacy to start)  2) DECREASE Diltiazem 120 mg: - take 1 tablet by mouth TWICE daily (or every 12 hours) (Sent to the mail order pharmacy)  *If you need a refill on your cardiac medications before your next appointment, please call your pharmacy*   Lab Work: - Your physician recommends that you have lab work today/ at [facility]  [place] Entrance at [facility] 1st desk on the right to check in (REGISTRATION)  Lab hours: Monday- Friday (7:30 am- 5:30 pm)    If you have labs (blood work) drawn today and your tests are completely normal, you will receive your results only by: MyChart Message (if you have MyChart) OR A paper copy in the mail If you have any lab test that is abnormal or we need to change your treatment, we will call you to review the results.   Testing/Procedures: - none ordered   Follow-Up: At [facility], you and your health needs are our priority.  As part of our continuing mission to provide you with exceptional heart care, we have created designated Provider Care Teams.  These Care Teams include your primary Cardiologist (physician) and Advanced Practice Providers (APPs -  Physician Assistants and Nurse Practitioners) who all work together to provide you with the care you need, when you need it.  We recommend signing up for the patient portal called "MyChart".  Sign up information is provided on this After Visit Summary.  MyChart is used to connect with patients for Virtual Visits (Telemedicine).  Patients are able to view lab/test results, encounter notes, upcoming appointments, etc.  Non-urgent messages can be sent to your provider as well.   To learn more about what you can do with MyChart, go to  [email or web address].    Your next appointment:   1) 3 weeks with a PA/ NP (for EKG follow up for flecainide start)  2) 3 months with Dr. Caryl Comes   The format for your next appointment:   In Person  Provider:   As above     Other Instructions  Flecainide Tablets What is this medication? FLECAINIDE (FLEK a nide) prevents and treats a fast or irregular heartbeat (arrhythmia). It is often used to treat a type of arrhythmia known as AFib (atrial fibrillation). It works by slowing down overactive electric signals in the heart, which stabilizes your heart rhythm. It belongs to a group of medications called antiarrhythmics. This medicine may be used for other purposes; ask your health care provider or pharmacist if you have questions. COMMON BRAND NAME(S): Tambocor What should I tell my care team before I take this medication? They need to know if you have any of these conditions: High or low levels of potassium in the blood Heart disease including heart rhythm and heart rate problems Kidney disease Liver disease Recent heart attack An unusual or allergic reaction to flecainide, other medications, foods, dyes, or preservatives Pregnant or trying to get pregnant Breastfeeding How should I use this medication? Take this medication by mouth with a glass of water. Take it as directed on the prescription label at the same time every day. You can take it with or without food. If it upsets your stomach, take it with food. Do not take your medication more often than  directed. Do not stop taking this medication suddenly. This may cause serious, heart-related side effects. If your care team wants you to stop the medication, the dose may be slowly lowered over time to avoid any side effects. Talk to your care team about the use of this medication in children. While it may be prescribed for children as young as 1 year for selected conditions, precautions do apply. Overdosage: If you think you have  taken too much of this medicine contact a poison control center or emergency room at once. NOTE: This medicine is only for you. Do not share this medicine with others. What if I miss a dose? If you miss a dose, take it as soon as you can. If it is almost time for your next dose, take only that dose. Do not take double or extra doses. What may interact with this medication? Do not take this medication with any of the following: Amoxapine Arsenic trioxide Certain antibiotics, such as clarithromycin, erythromycin, gatifloxacin, gemifloxacin, levofloxacin, moxifloxacin, sparfloxacin, or troleandomycin Certain antidepressants, called tricyclic antidepressants such as amitriptyline, imipramine, or nortriptyline Certain medications for irregular heartbeat, such as disopyramide, encainide, moricizine, procainamide, propafenone, and quinidine Cisapride Delavirdine Droperidol Haloperidol [place]orn Imatinib Levomethadyl Maprotiline Medications for malaria, such as chloroquine and halofantrine Pentamidine Phenothiazines, such as chlorpromazine, mesoridazine, prochlorperazine, thioridazine Pimozide Quinine Ranolazine Ritonavir Sertindole This medication may also interact with the following: Cimetidine Dofetilide Medications for angina or blood pressure Medications for irregular heartbeat, such as amiodarone and digoxin Ziprasidone This list may not describe all possible interactions. Give your health care provider a list of all the medicines, herbs, non-prescription drugs, or dietary supplements you use. Also tell them if you smoke, drink alcohol, or use illegal drugs. Some items may interact with your medicine. What should I watch for while using this medication? Visit your care team for regular checks on your progress. Because your condition and the use of this medication carries some risk, it is a good idea to carry an identification card, necklace, or bracelet with details of your condition,  medications, and care team. Check your blood pressure and pulse rate as directed. Know what your blood pressure and pulse rate should be and when tod contact your care team. Your care team may schedule regular blood tests and electrocardiograms to check your progress. This medication may affect your coordination, reaction time, or judgment. Do not drive or operate machinery until you know how this medication affects you. Sit up or stand slowly to reduce the risk of dizzy or fainting spells. Drinking alcohol with this medication can increase the risk of these side effects. What side effects may I notice from receiving this medication? Side effects that you should report to your care team as soon as possible: Allergic reactions--skin rash, itching, hives, swelling of the face, lips, tongue, or throat Heart failure--shortness of breath, swelling of the ankles, feet, or hands, sudden weight gain, unusual weakness or fatigue Heart rhythm changes--fast or irregular heartbeat, dizziness, feeling faint or lightheaded, chest pain, trouble breathing Liver injury--right upper belly pain, loss of appetite, nausea, light-colored stool, dark yellow or brown urine, yellowing skin or eyes, unusual weakness or fatigue Side effects that usually do not require medical attention (report to your care team if they continue or are bothersome): Blurry vision Constipation Dizziness Fatigue Headache Nausea Tremors or shaking This list may not describe all possible side effects. Call your doctor for medical advice about side effects. You may report side effects to FDA at 1-800-FDA-1088. Where should  I keep my medication? Keep out of the reach of children and pets. Store at room temperature between 15 and 30 degrees C (59 and 86 degrees F). Protect from light. Keep container tightly closed. Throw away any unused medication after the expiration date. NOTE: This sheet is a summary. It may not cover all possible information.  If you have questions about this medicine, talk to your doctor, pharmacist, or health care provider.  2023 Elsevier/Gold Standard (2004-09-22 00:00:00)   Important Information About Sugar

## 2022-06-29 ENCOUNTER — Other Ambulatory Visit
Admission: RE | Admit: 2022-06-29 | Discharge: 2022-06-29 | Disposition: A | Discharge status: Home / Self Care | Payer: Medicare HMO | Source: Ambulatory Visit | Attending: Internal Medicine | Admitting: Internal Medicine

## 2022-06-29 ENCOUNTER — Telehealth: Payer: Self-pay | Admitting: Internal Medicine

## 2022-06-29 DIAGNOSIS — I480 Paroxysmal atrial fibrillation: Secondary | ICD-10-CM | POA: Diagnosis not present

## 2022-06-29 LAB — MAGNESIUM: Magnesium: 1.9 mg/dL (ref 1.7–2.4)

## 2022-06-29 NOTE — Telephone Encounter (Signed)
Patient would like to know if Dr. Caryl Comes can order a smart watch to monitor afib through Medicare.

## 2022-06-29 NOTE — Telephone Encounter (Signed)
I spoke with the patient. I advised her that we would be glad to write her a RX for a smart watch, but I am uncertain if Medicare will pay for this or where to send the RX to.  I have asked her to reach out to Medicare and see if they will cover a smart watch and if so, where can the order be submitted or taken to.  The patient voices understanding and is agreeable.  She will call back with an update once received.

## 2022-07-02 ENCOUNTER — Telehealth: Payer: Self-pay | Admitting: Internal Medicine

## 2022-07-02 DIAGNOSIS — Z85828 Personal history of other malignant neoplasm of skin: Secondary | ICD-10-CM | POA: Diagnosis not present

## 2022-07-02 DIAGNOSIS — Z08 Encounter for follow-up examination after completed treatment for malignant neoplasm: Secondary | ICD-10-CM | POA: Diagnosis not present

## 2022-07-02 DIAGNOSIS — D3701 Neoplasm of uncertain behavior of lip: Secondary | ICD-10-CM | POA: Diagnosis not present

## 2022-07-02 NOTE — Telephone Encounter (Signed)
New Message:     Patient said she would like for Dr Caryl Comes to tell Dr Kary Kos what doctors she need to be referred to. In her network. She needs a doctor for her hands, mental health, shoulder and neck .

## 2022-07-03 NOTE — Telephone Encounter (Signed)
Patient was returning call to see if Dr. Caryl Comes or nurse got the message from yesterday which said:   She would like for Dr Caryl Comes to tell Dr Kary Kos what doctors she need to be referred to. In her network. She needs a doctor for her hands, mental health, shoulder and neck.  Please call back to discuss

## 2022-07-05 NOTE — Telephone Encounter (Signed)
I called and spoke with the patient. I advised her that we do not refer to hand specialist, orthopedics, or mental health as these referrals should come through her PCP.  The patient advised she just wanted recommendations of providers that she could give to Dr. Kary Kos.  I advised the patient we had given her Dr. Vanetta Shawl name at her office visit.  I did ask Dr. Caryl Comes, while I placed the patient on hold, if he had any specific recommendations for a shoulder/ neck specialist or mental health provider.  Per Dr. Caryl Comes, he had no other specific recommendations.  I have advised the patient of the above. She voices understanding and is agreeable.  She was appreciative of the call back.

## 2022-07-17 NOTE — Progress Notes (Unsigned)
Cardiology Office Note    Date:  07/19/2022   ID:  Krystal [name], Krystal [name] [birth year], MRN [id number]  PCP:  [name], MD  Cardiologist:  Kathlyn Sacramento, MD  Electrophysiologist:  Virl Axe, MD   Chief Complaint: Follow up  History of Present Illness:   Krystal [name] is a 71 y.o. female with history of PAF diagnosed in 02/2022, breast cancer s/p bilateral mastectomy and chemotherapy, fibromyalgia, HTN, HLD, arthritis, lymphedema, OSA on CPAP, depression, and GERD who presents for follow up of PAF.  She was previously followed by Dr. Clayborn Bigness with transition of her care to Dr. Fletcher Anon in 04/2022, as a second opinion regarding management of her Afib.   She was admitted to the hospital in 02/2022 after she was found on the floor by her husband with diarrhea and mental status change.  She was noted to be in A-fib with RVR.  Labs showed AKI and rhabdomyolysis.  She was found to have an E. coli UTI.  A-fib was treated with rate control and diltiazem, and she was started on anticoagulation with apixaban.  Echo showed normal LV systolic function with mild to moderate mitral regurgitation.  Carotid artery ultrasound showed no hemodynamically significant stenoses bilaterally.  Note indicates cardioversion was previously recommended.  However, upon establishing with our office in 04/2022, she was noted to be in sinus rhythm.  She reported exertional dyspnea without chest pain.  Lexiscan MPI in 04/2022 showed no evidence of ischemia or infarction with normal LVSF and was overall low risk.  CT attenuation corrected images showed mild aortic atherosclerosis.  Zio patch, to quantify A-fib burden, showed a predominant rhythm of sinus with an average rate of 90 bpm, 2 WCT runs occurred with the fastest and longest interval lasting 7 beats felt to be due to to NSVT versus SVT with aberrancy, and an overall 29% burden of A-fib/flutter, with episodes associated with patient triggered events.  She was seen on  06/25/2022 with Cardizem being titrated to 360 mg daily.  She was evaluated by Dr. Caryl Comes on 06/28/2022 with diltiazem being transitioned from 360 mg daily to 120 mg twice daily with initiation of flecainide 100 mg twice daily, and recommendation to follow-up today to review PR and QRS duration.  She comes in today.  Perspective, without symptoms of angina or decompensation.  She continues to note intermittent symptoms consistent with her prior known A-fib/flutter,.  She describes these episodes as a sensation of "burning" involving the shoulder and neck.  She reports having had a significant episode on 12/20 which lasted for approximately 12 hours.  She has noted to be in atrial flutter with variable AV block in the office today and has a similar burning sensation to what she has previously experienced.  No further falls or syncope.  No bleeding concerns.  Tolerating apixaban without issues.    Labs independently reviewed: 06/2022 - magnesium 1.9 05/2022 - potassium 3.9, BUN 13, serum creatinine 0.9, albumin 4.4, AST/ALT normal 03/2022 - A1c 6.3 02/2022 - TSH normal, Hgb 12.5, PLT 140 07/2021 - TC 230, TG 139, HDL 53, LDL 132  Past Medical History:  Diagnosis Date   Anemia    Arthritis    osteo   Blindness of right eye at birth   Cancer [facility]) 2011   Left breast 2011 and cervical age 78   Carpal tunnel syndrome    Cataract    Depression    Edema 07/27/2011   Fibromyalgia    muscle weakness and pain  GERD (gastroesophageal reflux disease)    Hyperlipidemia    Hypertension    benign   Lymphedema    Osteoporosis    Plantar fasciitis    Pre-diabetes    Raynaud's disease    Restless leg syndrome    Rosacea    Rotator cuff rupture    Sleep apnea    Synovitis    Ulcer     Past Surgical History:  Procedure Laterality Date   ABDOMINAL HYSTERECTOMY  1986   For bleeding and pain   BLADDER SURGERY  2006   Bladder tack   BREAST RECONSTRUCTION Bilateral 09/17/2012   Procedure: BREAST  RECONSTRUCTION;  Surgeon: Theodoro Kos, DO;  Location: [place];  Service: Plastics;  Laterality: Bilateral;  BILATERAL BREAST RECONSTRUCTION WITH TISSUE EXPANDERS AND ALLOMED   BREAST RECONSTRUCTION Right 06/03/2013   Procedure: RIGHT BREAST CAPSULE CONSTRACTURE;  Surgeon: Theodoro Kos, DO;  Location: [place];  Service: Plastics;  Laterality: Right;   CARPAL TUNNEL RELEASE Bilateral 2008   EYE SURGERY Right    INJECTION KNEE Left 08/12/2018   Procedure: KNEE INJECTION-LEFT;  Surgeon: Corky Mull, MD;  Location: [facility];  Service: Orthopedics;  Laterality: Left;   JOINT REPLACEMENT     LIPOSUCTION Bilateral 01/29/2013   Procedure: LIPOSUCTION;  Surgeon: Theodoro Kos, DO;  Location: [place];  Service: Plastics;  Laterality: Bilateral;   LIPOSUCTION Right 06/03/2013   Procedure: LIPOSUCTION;  Surgeon: Theodoro Kos, DO;  Location: [place];  Service: Plastics;  Laterality: Right;   MASTECTOMY  2012   rt prophalactic mast-snbx   MASTECTOMY MODIFIED RADICAL  2012   left-axillary nodes   NEUROMA SURGERY Bilateral    NOSE SURGERY  2007   PORT-A-CATH REMOVAL     insertion and   THROAT SURGERY     TONSILLECTOMY     TOTAL KNEE ARTHROPLASTY Right 08/12/2018   Procedure: TOTAL KNEE ARTHROPLASTY-RIGHT;  Surgeon: Corky Mull, MD;  Location: [facility];  Service: Orthopedics;  Laterality: Right;   TOTAL KNEE ARTHROPLASTY Left 09/22/2019   Procedure: TOTAL KNEE ARTHROPLASTY;  Surgeon: Corky Mull, MD;  Location: [facility];  Service: Orthopedics;  Laterality: Left;   UVULOPALATOPHARYNGOPLASTY      Current Medications: Current Meds  Medication Sig   alendronate (FOSAMAX) 70 MG tablet Take 70 mg by mouth every Saturday. Take with a full glass of water on an empty stomach.    apixaban (ELIQUIS) 5 MG TABS tablet Take 1 tablet (5 mg total) by mouth 2 (two) times daily.   cetirizine (ZYRTEC) 10 MG tablet Take 10 mg by mouth daily.    diltiazem (CARDIZEM CD) 120 MG 24 hr capsule Take 1 capsule (120 mg) by mouth twice daily   DULoxetine (CYMBALTA) 60 MG capsule Take 60 mg by mouth daily.   flecainide (TAMBOCOR) 100 MG tablet Take 1 tablet (100 mg total) by mouth 2 (two) times daily.   fluticasone (FLONASE) 50 MCG/ACT nasal spray Place 2 sprays into both nostrils daily.   folic acid (FOLVITE) 1 MG tablet Take 1 tablet (1 mg total) by mouth daily.   furosemide (LASIX) 20 MG tablet Take 20 mg by mouth 2 (two) times daily.    Glucosamine-Chondroitin (COSAMIN DS PO) Take 1 tablet by mouth 3 (three) times a week. Take as needed   levocetirizine (XYZAL) 5 MG tablet Take 5 mg by mouth daily.   LYSINE ACETATE PO Take 4,000 mg by mouth daily as needed (cold sores).  magnesium oxide (MAG-OX) 400 MG tablet Take 400 mg by mouth daily as needed (leg cramps).   meloxicam (MOBIC) 7.5 MG tablet Take 7.5 mg by mouth daily.   metoprolol succinate (TOPROL XL) 25 MG 24 hr tablet Take 1 tablet (25 mg total) by mouth daily.   omeprazole (PRILOSEC) 20 MG capsule Take 20 mg by mouth 2 (two) times daily before a meal.   potassium chloride (KLOR-CON) 10 MEQ tablet Take 10 mEq by mouth in the morning and at bedtime.   pregabalin (LYRICA) 300 MG capsule Take 300 mg by mouth 2 (two) times daily.     rOPINIRole (REQUIP) 1 MG tablet Take 1 mg by mouth at bedtime.    triamcinolone (KENALOG) 0.025 % cream APPLY 1 APPLICATION TOPICALLY DAILY AS NEEDED (RASH).   Vibegron (GEMTESA) 75 MG TABS Take 75 mg by mouth daily.   Vitamin D, Ergocalciferol, (DRISDOL) 1.25 MG (50000 UNIT) CAPS capsule Take 50,000 Units by mouth once a week.   zolpidem (AMBIEN) 10 MG tablet Take 10 mg by mouth at bedtime.    Allergies:   Cephalexin and Levaquin [levofloxacin]   Social History   Socioeconomic History   Marital status: Married    Spouse name: Not on file   Number of children: Not on file   Years of education: Not on file   Highest education level: Not on file   Occupational History   Not on file  Tobacco Use   Smoking status: Never    Passive exposure: Never   Smokeless tobacco: Never  Vaping Use   Vaping Use: Never used  Substance and Sexual Activity   Alcohol use: Yes    Comment: 16 oz per day wine   Drug use: No   Sexual activity: Yes  Other Topics Concern   Not on file  Social History Narrative   Not on file   Social Determinants of Health   Financial Resource Strain: Not on file  Food Insecurity: Not on file  Transportation Needs: Not on file  Physical Activity: Not on file  Stress: Not on file  Social Connections: Not on file     Family History:  The patient's family history includes Breast cancer in her maternal aunt and maternal aunt; Cancer in her cousin, cousin, father, maternal grandfather, and maternal grandmother; Cancer (age of onset: 10) in her sister; Cancer (age of onset: 61) in her mother; Hypertension in her brother and sister; Liver cancer in her maternal uncle; Lung cancer in her father and maternal uncle; Throat cancer in her father and maternal uncle.  ROS:   12-point review of systems is negative unless otherwise noted in HPI.   EKGs/Labs/Other Studies Reviewed:    Studies reviewed were summarized above. The additional studies were reviewed today:  Zio patch 05/2022: Patient had a min HR of 56 bpm, max HR of 194 bpm, and avg HR of 90 bpm. Predominant underlying rhythm was Sinus Rhythm. First Degree AV Block was present.  2 wide-complex tachycardia runs occurred, the run with the fastest interval lasting 7 beats with a max rate of 193 bpm (avg 179 bpm); the run with the fastest interval was also the longest.  These could be due to ventricular tachycardia versus SVT with aberrancy. Atrial Fibrillation/Flutter occurred (29% burden), ranging from 56-194 bpm (avg of 105 bpm), the longest lasting 1 day 6 hours with an avg rate of 114 bpm. Atrial Fibrillation was present at activation of device. Atrial  Fibrillation/Flutter was detected within +/- 45 seconds  of symptomatic patient event(s).  Rare PACs and rare PVCs. __________  Carlton Adam MPI 04/30/2022:   The study is normal. The study is low risk.   No ST deviation was noted.   LV perfusion is normal. There is no evidence of ischemia. There is no evidence of infarction.   Left ventricular function is normal. End diastolic cavity size is normal. End systolic cavity size is normal.   CT attenuation images showed mild aortic calcifications. __________  2D echo 02/27/2022: 1. Left ventricular ejection fraction, by estimation, is 65 to 70%. The  left ventricle has normal function. The left ventricle has no regional  wall motion abnormalities. Left ventricular diastolic parameters were  normal.   2. Right ventricular systolic function is normal. The right ventricular  size is normal.   3. The mitral valve is normal in structure. Mild to moderate mitral valve  regurgitation.   4. Tricuspid valve regurgitation is mild to moderate.   5. The aortic valve is normal in structure. Aortic valve regurgitation is  not visualized.  __________  Carotid artery ultrasound 02/27/2022: IMPRESSION: No Doppler evidence of hemodynamically significant (greater than 50%) carotid stenosis, with small amounts of nonstenosing plaque in the carotid bulbs and proximal cervical ICAs, and bilateral antegrade vertebral artery flow. __________  Nuclear stress test 08/01/2018 [facility]): LVEF= 73 %   FINDINGS:  Regional wall motion:  reveals normal myocardial thickening and wall  motion.  The overall quality of the study is good.   Artifacts noted: no  Left ventricular cavity: normal.   Perfusion Analysis:  SPECT images demonstrate homogeneous tracer  distribution throughout the myocardium.  __________  2D echo 08/01/2018 [facility]): INTERPRETATION  NORMAL LEFT VENTRICULAR SYSTOLIC FUNCTION   WITH MILD LVH  NORMAL RIGHT VENTRICULAR SYSTOLIC FUNCTION  MILD  VALVULAR REGURGITATION (See above)  NO VALVULAR STENOSIS  MILD MR, TR, PR  EF 55%  __________  06/29/2016 [facility]): LVEF= 65 %   FINDINGS:  Regional wall motion:  reveals normal myocardial thickening and wall  motion.  The overall quality of the study is good.   Artifacts noted: no  Left ventricular cavity: normal.   Perfusion Analysis:  SPECT images demonstrate homogeneous tracer  distribution throughout the myocardium.   __________  2D echo 06/29/2016 [facility]): INTERPRETATION  NORMAL LEFT VENTRICULAR SYSTOLIC FUNCTION WITH AN ESTIMATED EF = 55 %  NORMAL RIGHT VENTRICULAR SYSTOLIC FUNCTION  MODERATE MITRAL VALVE INSUFFICIENCY  MILD TRICUSPID VALVE INSUFFICIENCY  NO VALVULAR STENOSIS    EKG:  EKG is ordered today.  The EKG ordered today demonstrates atrial flutter with variable AV block, 75 bpm, incomplete RBBB  Recent Labs: 02/27/2022: ALT 30 02/28/2022: B Natriuretic Peptide 562.4 03/02/2022: BUN 13; Creatinine, Ser 1.11; Hemoglobin 12.5; Platelets 140; Potassium 3.6; Sodium 137 06/29/2022: Magnesium 1.9  Recent Lipid Panel    Component Value Date/Time   CHOL 197 04/03/2019 1026   TRIG 116 04/03/2019 1026   HDL 50 04/03/2019 1026   CHOLHDL 3.9 04/03/2019 1026   VLDL 23 04/03/2019 1026   LDLCALC 124 (H) 04/03/2019 1026    PHYSICAL EXAM:    VS:  BP 122/68 (BP Location: Left Arm, Patient Position: Sitting, Cuff Size: Normal)   Pulse 75   Ht '5\' 3"'$  (1.6 m)   Wt 192 lb 12.8 oz (87.5 kg)   SpO2 95%   BMI 34.15 kg/m   BMI: Body mass index is 34.15 kg/m.  Physical Exam Vitals reviewed.  Constitutional:      Appearance: She is well-developed.  HENT:  Head: Normocephalic and atraumatic.  Eyes:     General:        Right eye: No discharge.        Left eye: No discharge.  Neck:     Vascular: No JVD.  Cardiovascular:     Rate and Rhythm: Normal rate. Rhythm irregularly irregular.     Heart sounds: Normal heart sounds, S1 normal and S2 normal. Heart sounds not  distant. No midsystolic click and no opening snap. No murmur heard.    No friction rub.  Pulmonary:     Effort: Pulmonary effort is normal. No respiratory distress.     Breath sounds: Normal breath sounds. No decreased breath sounds, wheezing or rales.  Chest:     Chest wall: No tenderness.  Abdominal:     General: There is no distension.  Musculoskeletal:     Cervical back: Normal range of motion.  Skin:    General: Skin is warm and dry.     Nails: There is no clubbing.  Neurological:     Mental Status: She is alert and oriented to person, place, and time.  Psychiatric:        Speech: Speech normal.        Behavior: Behavior normal.        Thought Content: Thought content normal.        Judgment: Judgment normal.     Wt Readings from Last 3 Encounters:  07/19/22 192 lb 12.8 oz (87.5 kg)  06/28/22 188 lb 6.4 oz (85.5 kg)  06/25/22 184 lb 3.2 oz (83.6 kg)     ASSESSMENT & PLAN:   PAF/flutter: She is noted to be in atrial flutter with variable AV block and controlled ventricular response in the office today.  Add Toprol-XL 25 mg daily with continuation of Cardizem CD and flecainide.  She has not missed any doses of anticoagulation.  I will see her back in 1 week, if she has not spontaneously converted to sinus rhythm, we will pursue DCCV.  Will need to monitor PR interval.  Following conversion to sinus rhythm, either spontaneous or with DCCV, she is to remain on flecainide we will need to pursue treadmill.  However, if she does not maintain sinus rhythm we may need to pursue alternative antiarrhythmic therapy.  Follow-up with EP for further discussion.  CHA2DS2-VASc at least 3 (HTN, age x 1, sex category).  She remains on apixaban 5 mg twice daily and does not meet reduced dosing criteria.  Exertional dyspnea: Overall stable.  Lexiscan MPI without evidence of ischemia with echo showing preserved LV systolic function.  HTN: Blood pressure is well-controlled in the office today.   Continue current medical therapy with the addition of Toprol-XL.  OSA: On CPAP.  This was not discussed at today's visit.   Disposition: F/u with Dr. Fletcher Anon or an APP in 1 week.   Medication Adjustments/Labs and Tests Ordered: Current medicines are reviewed at length with the patient today.  Concerns regarding medicines are outlined above. Medication changes, Labs and Tests ordered today are summarized above and listed in the Patient Instructions accessible in Encounters.   Signed, Christell Faith, PA-C 07/19/2022 1:12 PM     [address] [place], [place] [address] [phone]

## 2022-07-19 ENCOUNTER — Encounter: Payer: Self-pay | Admitting: Physician Assistant

## 2022-07-19 ENCOUNTER — Ambulatory Visit: Payer: Medicare HMO | Attending: Physician Assistant | Admitting: Physician Assistant

## 2022-07-19 VITALS — BP 122/68 | HR 75 | Ht 63.0 in | Wt 192.8 lb

## 2022-07-19 DIAGNOSIS — R0609 Other forms of dyspnea: Secondary | ICD-10-CM

## 2022-07-19 DIAGNOSIS — I4892 Unspecified atrial flutter: Secondary | ICD-10-CM | POA: Diagnosis not present

## 2022-07-19 DIAGNOSIS — I480 Paroxysmal atrial fibrillation: Secondary | ICD-10-CM | POA: Diagnosis not present

## 2022-07-19 DIAGNOSIS — I10 Essential (primary) hypertension: Secondary | ICD-10-CM

## 2022-07-19 MED ORDER — METOPROLOL SUCCINATE ER 25 MG PO TB24: 25.0000 mg | ORAL_TABLET | Freq: Every day | ORAL | 1 refills | Status: DC

## 2022-07-19 NOTE — Patient Instructions (Addendum)
Medication Instructions:   Your physician has recommended you make the following change in your medication:   Start Metoprolol Succinate 25 mg tablet by mouth daily.  *If you need a refill on your cardiac medications before your next appointment, please call your pharmacy*   Lab Work:  Your physician recommends that you return for lab work NEXT WEEK AT [facility]. Please got to registration desk and check in and they will direct you to the lab.  -CBC/BMP  If you have labs (blood work) drawn today and your tests are completely normal, you will receive your results only by: [facility] (if you have MyChart) OR A paper copy in the mail If you have any lab test that is abnormal or we need to change your treatment, we will call you to review the results.   Testing/Procedures:   We will contact you with date and time for your cardioversion.   You are scheduled for a Cardioversion on ________________ with Dr.__Arida_________ Please arrive at the [place] [facility] at _________ a.m. on the day of your procedure.  DIET INSTRUCTIONS:  Nothing to eat or drink after midnight except your medications with a              sip of water.         Labs: __CBC and BMP_next week no appointment is needed; Please go to the following location:   [facility] at [facility] 1st desk on the right to check in (REGISTRATION)  Lab hours: Monday- Friday (7:30 am- 5:30 pm)   Medications:   Please hold Furosemide 20 mg tablet until after procedure. YOU MAY TAKE ALL of your remaining medications with a small amount of water please do not eat or drink past midnight.   Please DO NOT STOP your Eliquis (blood thinner) . If you stop please contact office.   Must have a responsible person to drive you home.  Bring a current list of your medications and current insurance cards.    If you have any questions after you get home, please call the office at [phone]       Follow-Up: At  [facility], you and your health needs are our priority.  As part of our continuing mission to provide you with exceptional heart care, we have created designated Provider Care Teams.  These Care Teams include your primary Cardiologist (physician) and Advanced Practice Providers (APPs -  Physician Assistants and Nurse Practitioners) who all work together to provide you with the care you need, when you need it.  We recommend signing up for the patient portal called "MyChart".  Sign up information is provided on this After Visit Summary.  MyChart is used to connect with patients for Virtual Visits (Telemedicine).  Patients are able to view lab/test results, encounter notes, upcoming appointments, etc.  Non-urgent messages can be sent to your provider as well.   To learn more about what you can do with MyChart, go to [email or web address].    Your next appointment:   1 week(s)  The format for your next appointment:   In Person  Provider:   Christell Faith, PA-C   Important Information About Sugar

## 2022-07-25 DIAGNOSIS — S0990XA Unspecified injury of head, initial encounter: Secondary | ICD-10-CM | POA: Diagnosis not present

## 2022-07-25 DIAGNOSIS — R400 Somnolence: Secondary | ICD-10-CM | POA: Diagnosis not present

## 2022-07-25 DIAGNOSIS — I480 Paroxysmal atrial fibrillation: Secondary | ICD-10-CM | POA: Diagnosis not present

## 2022-07-25 DIAGNOSIS — N1832 Chronic kidney disease, stage 3b: Secondary | ICD-10-CM | POA: Diagnosis not present

## 2022-07-25 DIAGNOSIS — M159 Polyosteoarthritis, unspecified: Secondary | ICD-10-CM | POA: Diagnosis not present

## 2022-07-25 DIAGNOSIS — E1122 Type 2 diabetes mellitus with diabetic chronic kidney disease: Secondary | ICD-10-CM | POA: Diagnosis not present

## 2022-07-25 DIAGNOSIS — I129 Hypertensive chronic kidney disease with stage 1 through stage 4 chronic kidney disease, or unspecified chronic kidney disease: Secondary | ICD-10-CM | POA: Diagnosis not present

## 2022-07-25 NOTE — Progress Notes (Unsigned)
Cardiology Office Note    Date:  07/26/2022   ID:  Krystal [name], Krystal [name] 1951/06/05, MRN [id number]  PCP:  Krystal Pink, MD  Cardiologist:  Kathlyn Sacramento, MD  Electrophysiologist:  Virl Axe, MD   Chief Complaint: Follow up  History of Present Illness:   Krystal [name] is a 72 y.o. female with history of PAF diagnosed in 02/2022, breast cancer s/p bilateral mastectomy and chemotherapy, fibromyalgia, HTN, HLD, arthritis, lymphedema, OSA on CPAP, depression, and GERD who presents for follow up of PAF.   She was previously followed by Dr. Clayborn Bigness with transition of her care to Dr. Fletcher Anon in 04/2022, as a second opinion regarding management of her Afib.    She was admitted to the hospital in 02/2022 after she was found on the floor by her husband with diarrhea and mental status change.  She was noted to be in A-fib with RVR.  Labs showed AKI and rhabdomyolysis.  She was found to have an E. coli UTI.  A-fib was treated with rate control and diltiazem, and she was started on anticoagulation with apixaban.  Echo showed normal LV systolic function with mild to moderate mitral regurgitation.  Carotid artery ultrasound showed no hemodynamically significant stenoses bilaterally.  Note indicates cardioversion was previously recommended.  However, upon establishing with our office in 04/2022, she was noted to be in sinus rhythm.  She reported exertional dyspnea without chest pain.  Lexiscan MPI in 04/2022 showed no evidence of ischemia or infarction with normal LVSF and was overall low risk.  CT attenuation corrected images showed mild aortic atherosclerosis.  Zio patch, to quantify A-fib burden, showed a predominant rhythm of sinus with an average rate of 90 bpm, 2 WCT runs occurred with the fastest and longest interval lasting 7 beats felt to be due to to NSVT versus SVT with aberrancy, and an overall 29% burden of A-fib/flutter, with episodes associated with patient triggered events.  She was seen on  06/25/2022 with Cardizem being titrated to 360 mg daily.   She was evaluated by Dr. Caryl Comes on 06/28/2022 with diltiazem being transitioned from 360 mg daily to 120 mg twice daily with initiation of flecainide 100 mg twice daily.  In follow-up on 07/19/2022, she was without symptoms of angina or decompensation.  She continued to note intermittent palpitations and was in atrial flutter with variable AV block in the office.  She remained on apixaban without missing doses.  Toprol-XL 25 mg daily was added with continuation of Cardizem CD and flecainide.  She suffered a mechanical fall on 07/24/2022, reaching for a railing to go up a step into her kitchen, falling backwards landing on her buttocks and back.  She did strike the back of her head on the tile floor leading to a contusion and bleeding.  No LOC, loss of bowel/bladder function, or seizure-like activity.  She did not present to the ED.  She was evaluated by her PCP yesterday who ordered a head CT, which is currently awaiting scheduling.  Today, she presents with her husband.  She feels like she is back to her baseline and does not have any residual neurological deficit.  No symptoms of angina or decompensation.  No dyspnea or palpitations.  No hematochezia or melena.  No hemoptysis, hematuria, or hematemesis.  She has not missed any doses of anticoagulation.   Labs independently reviewed: 06/2022 - magnesium 1.9 05/2022 - potassium 3.9, BUN 13, serum creatinine 0.9, albumin 4.4, AST/ALT normal 03/2022 - A1c 6.3 02/2022 -  TSH normal, Hgb 12.5, PLT 140 07/2021 - TC 230, TG 139, HDL 53, LDL 132  Past Medical History:  Diagnosis Date   Anemia    Arthritis    osteo   Blindness of right eye at birth   Cancer [facility]) 2011   Left breast 2011 and cervical age 30   Carpal tunnel syndrome    Cataract    Depression    Edema 07/27/2011   Fibromyalgia    muscle weakness and pain   GERD (gastroesophageal reflux disease)    Hyperlipidemia    Hypertension     benign   Lymphedema    Osteoporosis    Plantar fasciitis    Pre-diabetes    Raynaud's disease    Restless leg syndrome    Rosacea    Rotator cuff rupture    Sleep apnea    Synovitis    Ulcer     Past Surgical History:  Procedure Laterality Date   ABDOMINAL HYSTERECTOMY  1986   For bleeding and pain   BLADDER SURGERY  2006   Bladder tack   BREAST RECONSTRUCTION Bilateral 09/17/2012   Procedure: BREAST RECONSTRUCTION;  Surgeon: Theodoro Kos, DO;  Location: [place];  Service: Plastics;  Laterality: Bilateral;  BILATERAL BREAST RECONSTRUCTION WITH TISSUE EXPANDERS AND ALLOMED   BREAST RECONSTRUCTION Right 06/03/2013   Procedure: RIGHT BREAST CAPSULE CONSTRACTURE;  Surgeon: Theodoro Kos, DO;  Location: [place];  Service: Plastics;  Laterality: Right;   CARPAL TUNNEL RELEASE Bilateral 2008   EYE SURGERY Right    INJECTION KNEE Left 08/12/2018   Procedure: KNEE INJECTION-LEFT;  Surgeon: Corky Mull, MD;  Location: [facility];  Service: Orthopedics;  Laterality: Left;   JOINT REPLACEMENT     LIPOSUCTION Bilateral 01/29/2013   Procedure: LIPOSUCTION;  Surgeon: Theodoro Kos, DO;  Location: [place];  Service: Plastics;  Laterality: Bilateral;   LIPOSUCTION Right 06/03/2013   Procedure: LIPOSUCTION;  Surgeon: Theodoro Kos, DO;  Location: [place];  Service: Plastics;  Laterality: Right;   MASTECTOMY  2012   rt prophalactic mast-snbx   MASTECTOMY MODIFIED RADICAL  2012   left-axillary nodes   NEUROMA SURGERY Bilateral    NOSE SURGERY  2007   PORT-A-CATH REMOVAL     insertion and   THROAT SURGERY     TONSILLECTOMY     TOTAL KNEE ARTHROPLASTY Right 08/12/2018   Procedure: TOTAL KNEE ARTHROPLASTY-RIGHT;  Surgeon: Corky Mull, MD;  Location: [facility];  Service: Orthopedics;  Laterality: Right;   TOTAL KNEE ARTHROPLASTY Left 09/22/2019   Procedure: TOTAL KNEE ARTHROPLASTY;  Surgeon: Corky Mull, MD;  Location: [facility];  Service: Orthopedics;  Laterality: Left;   UVULOPALATOPHARYNGOPLASTY      Current Medications: Current Meds  Medication Sig   alendronate (FOSAMAX) 70 MG tablet Take 70 mg by mouth every Saturday. Take with a full glass of water on an empty stomach.    apixaban (ELIQUIS) 5 MG TABS tablet Take 1 tablet (5 mg total) by mouth 2 (two) times daily.   cetirizine (ZYRTEC) 10 MG tablet Take 10 mg by mouth daily.   diltiazem (CARDIZEM CD) 120 MG 24 hr capsule Take 1 capsule (120 mg) by mouth twice daily   DULoxetine (CYMBALTA) 60 MG capsule Take 60 mg by mouth daily.   flecainide (TAMBOCOR) 100 MG tablet Take 1 tablet (100 mg total) by mouth 2 (two) times daily.   fluticasone (FLONASE) 50 MCG/ACT nasal spray Place 2 sprays  into both nostrils daily.   folic acid (FOLVITE) 1 MG tablet Take 1 tablet (1 mg total) by mouth daily.   furosemide (LASIX) 20 MG tablet Take 20 mg by mouth 2 (two) times daily.    Glucosamine-Chondroitin (COSAMIN DS PO) Take 1 tablet by mouth 3 (three) times a week. Take as needed   levocetirizine (XYZAL) 5 MG tablet Take 5 mg by mouth daily.   LYSINE ACETATE PO Take 4,000 mg by mouth daily as needed (cold sores).    magnesium oxide (MAG-OX) 400 MG tablet Take 400 mg by mouth daily as needed (leg cramps).   meloxicam (MOBIC) 7.5 MG tablet Take 7.5 mg by mouth daily.   metoprolol succinate (TOPROL XL) 25 MG 24 hr tablet Take 1 tablet (25 mg total) by mouth daily.   omeprazole (PRILOSEC) 20 MG capsule Take 20 mg by mouth 2 (two) times daily before a meal.   potassium chloride (KLOR-CON) 10 MEQ tablet Take 10 mEq by mouth in the morning and at bedtime.   pregabalin (LYRICA) 300 MG capsule Take 300 mg by mouth 2 (two) times daily.     rOPINIRole (REQUIP) 1 MG tablet Take 1 mg by mouth at bedtime.    triamcinolone (KENALOG) 0.025 % cream APPLY 1 APPLICATION TOPICALLY DAILY AS NEEDED (RASH).   Vibegron (GEMTESA) 75 MG TABS Take 75 mg by mouth daily.   Vitamin D,  Ergocalciferol, (DRISDOL) 1.25 MG (50000 UNIT) CAPS capsule Take 50,000 Units by mouth once a week.   zolpidem (AMBIEN) 10 MG tablet Take 10 mg by mouth at bedtime.    Allergies:   Cephalexin and Levaquin [levofloxacin]   Social History   Socioeconomic History   Marital status: Married    Spouse name: Not on file   Number of children: Not on file   Years of education: Not on file   Highest education level: Not on file  Occupational History   Not on file  Tobacco Use   Smoking status: Never    Passive exposure: Never   Smokeless tobacco: Never  Vaping Use   Vaping Use: Never used  Substance and Sexual Activity   Alcohol use: Yes    Comment: 16 oz per day wine   Drug use: No   Sexual activity: Yes  Other Topics Concern   Not on file  Social History Narrative   Not on file   Social Determinants of Health   Financial Resource Strain: Not on file  Food Insecurity: Not on file  Transportation Needs: Not on file  Physical Activity: Not on file  Stress: Not on file  Social Connections: Not on file     Family History:  The patient's family history includes Breast cancer in her maternal aunt and maternal aunt; Cancer in her cousin, cousin, father, maternal grandfather, and maternal grandmother; Cancer (age of onset: 60) in her sister; Cancer (age of onset: 81) in her mother; Hypertension in her brother and sister; Liver cancer in her maternal uncle; Lung cancer in her father and maternal uncle; Throat cancer in her father and maternal uncle.  ROS:   12-point review of systems is negative unless otherwise noted in the HPI.   EKGs/Labs/Other Studies Reviewed:    Studies reviewed were summarized above. The additional studies were reviewed today:  Zio patch 05/2022: Patient had a min HR of 56 bpm, max HR of 194 bpm, and avg HR of 90 bpm. Predominant underlying rhythm was Sinus Rhythm. First Degree AV Block was present.  2  wide-complex tachycardia runs occurred, the run with  the fastest interval lasting 7 beats with a max rate of 193 bpm (avg 179 bpm); the run with the fastest interval was also the longest.  These could be due to ventricular tachycardia versus SVT with aberrancy. Atrial Fibrillation/Flutter occurred (29% burden), ranging from 56-194 bpm (avg of 105 bpm), the longest lasting 1 day 6 hours with an avg rate of 114 bpm. Atrial Fibrillation was present at activation of device. Atrial Fibrillation/Flutter was detected within +/- 45 seconds of symptomatic patient event(s).  Rare PACs and rare PVCs. __________   Carlton Adam MPI 04/30/2022:   The study is normal. The study is low risk.   No ST deviation was noted.   LV perfusion is normal. There is no evidence of ischemia. There is no evidence of infarction.   Left ventricular function is normal. End diastolic cavity size is normal. End systolic cavity size is normal.   CT attenuation images showed mild aortic calcifications. __________   2D echo 02/27/2022: 1. Left ventricular ejection fraction, by estimation, is 65 to 70%. The  left ventricle has normal function. The left ventricle has no regional  wall motion abnormalities. Left ventricular diastolic parameters were  normal.   2. Right ventricular systolic function is normal. The right ventricular  size is normal.   3. The mitral valve is normal in structure. Mild to moderate mitral valve  regurgitation.   4. Tricuspid valve regurgitation is mild to moderate.   5. The aortic valve is normal in structure. Aortic valve regurgitation is  not visualized.  __________   Carotid artery ultrasound 02/27/2022: IMPRESSION: No Doppler evidence of hemodynamically significant (greater than 50%) carotid stenosis, with small amounts of nonstenosing plaque in the carotid bulbs and proximal cervical ICAs, and bilateral antegrade vertebral artery flow. __________   Nuclear stress test 08/01/2018 [facility]): LVEF= 73 %   FINDINGS:  Regional wall motion:  reveals  normal myocardial thickening and wall  motion.  The overall quality of the study is good.   Artifacts noted: no  Left ventricular cavity: normal.   Perfusion Analysis:  SPECT images demonstrate homogeneous tracer  distribution throughout the myocardium.  __________   2D echo 08/01/2018 [facility]): INTERPRETATION  NORMAL LEFT VENTRICULAR SYSTOLIC FUNCTION   WITH MILD LVH  NORMAL RIGHT VENTRICULAR SYSTOLIC FUNCTION  MILD VALVULAR REGURGITATION (See above)  NO VALVULAR STENOSIS  MILD MR, TR, PR  EF 55%  __________   06/29/2016 [facility]): LVEF= 65 %   FINDINGS:  Regional wall motion:  reveals normal myocardial thickening and wall  motion.  The overall quality of the study is good.   Artifacts noted: no  Left ventricular cavity: normal.   Perfusion Analysis:  SPECT images demonstrate homogeneous tracer  distribution throughout the myocardium.   __________   2D echo 06/29/2016 [facility]): INTERPRETATION  NORMAL LEFT VENTRICULAR SYSTOLIC FUNCTION WITH AN ESTIMATED EF = 55 %  NORMAL RIGHT VENTRICULAR SYSTOLIC FUNCTION  MODERATE MITRAL VALVE INSUFFICIENCY  MILD TRICUSPID VALVE INSUFFICIENCY  NO VALVULAR STENOSIS    EKG:  EKG is ordered today.  The EKG ordered today demonstrates A-fib with slow ventricular response, 57 bpm, nonspecific ST-T changes  Recent Labs: 02/27/2022: ALT 30 02/28/2022: B Natriuretic Peptide 562.4 03/02/2022: BUN 13; Creatinine, Ser 1.11; Hemoglobin 12.5; Platelets 140; Potassium 3.6; Sodium 137 06/29/2022: Magnesium 1.9  Recent Lipid Panel    Component Value Date/Time   CHOL 197 04/03/2019 1026   TRIG 116 04/03/2019 1026   HDL 50 04/03/2019 1026  CHOLHDL 3.9 04/03/2019 1026   VLDL 23 04/03/2019 1026   LDLCALC 124 (H) 04/03/2019 1026    PHYSICAL EXAM:    VS:  BP (!) 108/52 (BP Location: Right Arm, Patient Position: Sitting, Cuff Size: Normal)   Pulse (!) 57   Ht '5\' 3"'$  (1.6 m)   Wt 200 lb (90.7 kg)   SpO2 98%   BMI 35.43 kg/m   BMI: Body mass  index is 35.43 kg/m.  Physical Exam Vitals reviewed.  Constitutional:      Appearance: She is well-developed.  HENT:     Head: Normocephalic and atraumatic.     Comments: Contusion noted along the upper posterior scalp with mild tenderness to palpation and no active bleeding.  Mild soft tissue swelling.  No C-spine tenderness to palpation.  She does have some mid thoracic and lumbar tenderness to palpation. Eyes:     General:        Right eye: No discharge.        Left eye: No discharge.  Neck:     Vascular: No JVD.  Cardiovascular:     Rate and Rhythm: Bradycardia present. Rhythm irregularly irregular.     Heart sounds: Normal heart sounds, S1 normal and S2 normal. Heart sounds not distant. No midsystolic click and no opening snap. No murmur heard.    No friction rub.  Pulmonary:     Effort: Pulmonary effort is normal. No respiratory distress.     Breath sounds: Normal breath sounds. No decreased breath sounds, wheezing or rales.  Chest:     Chest wall: No tenderness.  Abdominal:     General: There is no distension.  Musculoskeletal:     Cervical back: Normal range of motion.  Skin:    General: Skin is warm and dry.     Nails: There is no clubbing.  Neurological:     Mental Status: She is alert and oriented to person, place, and time.  Psychiatric:        Speech: Speech normal.        Behavior: Behavior normal.        Thought Content: Thought content normal.        Judgment: Judgment normal.     Wt Readings from Last 3 Encounters:  07/26/22 200 lb (90.7 kg)  07/19/22 192 lb 12.8 oz (87.5 kg)  06/28/22 188 lb 6.4 oz (85.5 kg)     ASSESSMENT & PLAN:   Mechanical fall with head contusion and thoracic/lumbar pain: Alert and oriented x 3.  Schedule stat head CT and cervical spine without contrast.  If abnormal, patient will need to present to the ED.  No cervical spine tenderness to palpation.  Defer evaluation and follow-up of thoracic and lumbar pain to  PCP.  PAF/flutter: She remains in A-fib with slow ventricular response.  She did not convert following the addition of metoprolol.  Given prior first-degree AV block, and in the context of slow ventricular response, we will discontinue Toprol-XL.  She remains on Cardizem CD and flecainide.  If there is no significant intracranial bleed on a stat head CT we will continue to plan for previously scheduled DCCV on 08/06/2022.  She has not missed any doses of apixaban, and does not meet reduced dosing criteria.  CHA2DS2-VASc at least 3.  If, following DCCV, she maintains sinus rhythm we will need to pursue flecainide treadmill in follow-up.  If she does not maintain sinus rhythm would recommend follow-up with EP for further discussion of antiarrhythmic therapy.  Exertional dyspnea: Overall stable.  Lexiscan MPI without evidence of ischemia with echo showing preserved LV systolic function.  This was not discussed in detail today given the above.  HTN: Blood pressure is well-controlled in the office today.  Orthostatics negative.  Discontinue Toprol-XL as outlined above with continuation of Cardizem CD.  OSA: CPAP.  This was not discussed in detail at today's visit.    Disposition: F/u with Dr. Fletcher Anon or an APP after DCCV, and EP as directed.   Medication Adjustments/Labs and Tests Ordered: Current medicines are reviewed at length with the patient today.  Concerns regarding medicines are outlined above. Medication changes, Labs and Tests ordered today are summarized above and listed in the Patient Instructions accessible in Encounters.   Signed, Christell Faith, PA-C 07/26/2022 3:15 PM     [place] [address] [place] [address] [place], [place] [address] [phone]

## 2022-07-25 NOTE — H&P (View-Only) (Signed)
Cardiology Office Note    Date:  07/26/2022   ID:  Charell, Faulk 03-20-1951, MRN [id number]  PCP:  [name], MD  Cardiologist:  Kathlyn Sacramento, MD  Electrophysiologist:  Virl Axe, MD   Chief Complaint: Follow up  History of Present Illness:   Krystal [name] is a 72 y.o. female with history of PAF diagnosed in 02/2022, breast cancer s/p bilateral mastectomy and chemotherapy, fibromyalgia, HTN, HLD, arthritis, lymphedema, OSA on CPAP, depression, and GERD who presents for follow up of PAF.   She was previously followed by Dr. Clayborn Bigness with transition of her care to Dr. Fletcher Anon in 04/2022, as a second opinion regarding management of her Afib.    She was admitted to the hospital in 02/2022 after she was found on the floor by her husband with diarrhea and mental status change.  She was noted to be in A-fib with RVR.  Labs showed AKI and rhabdomyolysis.  She was found to have an E. coli UTI.  A-fib was treated with rate control and diltiazem, and she was started on anticoagulation with apixaban.  Echo showed normal LV systolic function with mild to moderate mitral regurgitation.  Carotid artery ultrasound showed no hemodynamically significant stenoses bilaterally.  Note indicates cardioversion was previously recommended.  However, upon establishing with our office in 04/2022, she was noted to be in sinus rhythm.  She reported exertional dyspnea without chest pain.  Lexiscan MPI in 04/2022 showed no evidence of ischemia or infarction with normal LVSF and was overall low risk.  CT attenuation corrected images showed mild aortic atherosclerosis.  Zio patch, to quantify A-fib burden, showed a predominant rhythm of sinus with an average rate of 90 bpm, 2 WCT runs occurred with the fastest and longest interval lasting 7 beats felt to be due to to NSVT versus SVT with aberrancy, and an overall 29% burden of A-fib/flutter, with episodes associated with patient triggered events.  She was seen on  06/25/2022 with Cardizem being titrated to 360 mg daily.   She was evaluated by Dr. Caryl Comes on 06/28/2022 with diltiazem being transitioned from 360 mg daily to 120 mg twice daily with initiation of flecainide 100 mg twice daily.  In follow-up on 07/19/2022, she was without symptoms of angina or decompensation.  She continued to note intermittent palpitations and was in atrial flutter with variable AV block in the office.  She remained on apixaban without missing doses.  Toprol-XL 25 mg daily was added with continuation of Cardizem CD and flecainide.  She suffered a mechanical fall on 07/24/2022, reaching for a railing to go up a step into her kitchen, falling backwards landing on her buttocks and back.  She did strike the back of her head on the tile floor leading to a contusion and bleeding.  No LOC, loss of bowel/bladder function, or seizure-like activity.  She did not present to the ED.  She was evaluated by her PCP yesterday who ordered a head CT, which is currently awaiting scheduling.  Today, she presents with her husband.  She feels like she is back to her baseline and does not have any residual neurological deficit.  No symptoms of angina or decompensation.  No dyspnea or palpitations.  No hematochezia or melena.  No hemoptysis, hematuria, or hematemesis.  She has not missed any doses of anticoagulation.   Labs independently reviewed: 06/2022 - magnesium 1.9 05/2022 - potassium 3.9, BUN 13, serum creatinine 0.9, albumin 4.4, AST/ALT normal 03/2022 - A1c 6.3 02/2022 -  TSH normal, Hgb 12.5, PLT 140 07/2021 - TC 230, TG 139, HDL 53, LDL 132  Past Medical History:  Diagnosis Date   Anemia    Arthritis    osteo   Blindness of right eye at birth   Cancer [facility]) 2011   Left breast 2011 and cervical age 70   Carpal tunnel syndrome    Cataract    Depression    Edema 07/27/2011   Fibromyalgia    muscle weakness and pain   GERD (gastroesophageal reflux disease)    Hyperlipidemia    Hypertension     benign   Lymphedema    Osteoporosis    Plantar fasciitis    Pre-diabetes    Raynaud's disease    Restless leg syndrome    Rosacea    Rotator cuff rupture    Sleep apnea    Synovitis    Ulcer     Past Surgical History:  Procedure Laterality Date   ABDOMINAL HYSTERECTOMY  1986   For bleeding and pain   BLADDER SURGERY  2006   Bladder tack   BREAST RECONSTRUCTION Bilateral 09/17/2012   Procedure: BREAST RECONSTRUCTION;  Surgeon: Theodoro Kos, DO;  Location: [place];  Service: Plastics;  Laterality: Bilateral;  BILATERAL BREAST RECONSTRUCTION WITH TISSUE EXPANDERS AND ALLOMED   BREAST RECONSTRUCTION Right 06/03/2013   Procedure: RIGHT BREAST CAPSULE CONSTRACTURE;  Surgeon: Theodoro Kos, DO;  Location: [place];  Service: Plastics;  Laterality: Right;   CARPAL TUNNEL RELEASE Bilateral 2008   EYE SURGERY Right    INJECTION KNEE Left 08/12/2018   Procedure: KNEE INJECTION-LEFT;  Surgeon: Corky Mull, MD;  Location: [facility];  Service: Orthopedics;  Laterality: Left;   JOINT REPLACEMENT     LIPOSUCTION Bilateral 01/29/2013   Procedure: LIPOSUCTION;  Surgeon: Theodoro Kos, DO;  Location: [place];  Service: Plastics;  Laterality: Bilateral;   LIPOSUCTION Right 06/03/2013   Procedure: LIPOSUCTION;  Surgeon: Theodoro Kos, DO;  Location: [place];  Service: Plastics;  Laterality: Right;   MASTECTOMY  2012   rt prophalactic mast-snbx   MASTECTOMY MODIFIED RADICAL  2012   left-axillary nodes   NEUROMA SURGERY Bilateral    NOSE SURGERY  2007   PORT-A-CATH REMOVAL     insertion and   THROAT SURGERY     TONSILLECTOMY     TOTAL KNEE ARTHROPLASTY Right 08/12/2018   Procedure: TOTAL KNEE ARTHROPLASTY-RIGHT;  Surgeon: Corky Mull, MD;  Location: [facility];  Service: Orthopedics;  Laterality: Right;   TOTAL KNEE ARTHROPLASTY Left 09/22/2019   Procedure: TOTAL KNEE ARTHROPLASTY;  Surgeon: Corky Mull, MD;  Location: [facility];  Service: Orthopedics;  Laterality: Left;   UVULOPALATOPHARYNGOPLASTY      Current Medications: Current Meds  Medication Sig   alendronate (FOSAMAX) 70 MG tablet Take 70 mg by mouth every Saturday. Take with a full glass of water on an empty stomach.    apixaban (ELIQUIS) 5 MG TABS tablet Take 1 tablet (5 mg total) by mouth 2 (two) times daily.   cetirizine (ZYRTEC) 10 MG tablet Take 10 mg by mouth daily.   diltiazem (CARDIZEM CD) 120 MG 24 hr capsule Take 1 capsule (120 mg) by mouth twice daily   DULoxetine (CYMBALTA) 60 MG capsule Take 60 mg by mouth daily.   flecainide (TAMBOCOR) 100 MG tablet Take 1 tablet (100 mg total) by mouth 2 (two) times daily.   fluticasone (FLONASE) 50 MCG/ACT nasal spray Place 2 sprays  into both nostrils daily.   folic acid (FOLVITE) 1 MG tablet Take 1 tablet (1 mg total) by mouth daily.   furosemide (LASIX) 20 MG tablet Take 20 mg by mouth 2 (two) times daily.    Glucosamine-Chondroitin (COSAMIN DS PO) Take 1 tablet by mouth 3 (three) times a week. Take as needed   levocetirizine (XYZAL) 5 MG tablet Take 5 mg by mouth daily.   LYSINE ACETATE PO Take 4,000 mg by mouth daily as needed (cold sores).    magnesium oxide (MAG-OX) 400 MG tablet Take 400 mg by mouth daily as needed (leg cramps).   meloxicam (MOBIC) 7.5 MG tablet Take 7.5 mg by mouth daily.   metoprolol succinate (TOPROL XL) 25 MG 24 hr tablet Take 1 tablet (25 mg total) by mouth daily.   omeprazole (PRILOSEC) 20 MG capsule Take 20 mg by mouth 2 (two) times daily before a meal.   potassium chloride (KLOR-CON) 10 MEQ tablet Take 10 mEq by mouth in the morning and at bedtime.   pregabalin (LYRICA) 300 MG capsule Take 300 mg by mouth 2 (two) times daily.     rOPINIRole (REQUIP) 1 MG tablet Take 1 mg by mouth at bedtime.    triamcinolone (KENALOG) 0.025 % cream APPLY 1 APPLICATION TOPICALLY DAILY AS NEEDED (RASH).   Vibegron (GEMTESA) 75 MG TABS Take 75 mg by mouth daily.   Vitamin D,  Ergocalciferol, (DRISDOL) 1.25 MG (50000 UNIT) CAPS capsule Take 50,000 Units by mouth once a week.   zolpidem (AMBIEN) 10 MG tablet Take 10 mg by mouth at bedtime.    Allergies:   Cephalexin and Levaquin [levofloxacin]   Social History   Socioeconomic History   Marital status: Married    Spouse name: Not on file   Number of children: Not on file   Years of education: Not on file   Highest education level: Not on file  Occupational History   Not on file  Tobacco Use   Smoking status: Never    Passive exposure: Never   Smokeless tobacco: Never  Vaping Use   Vaping Use: Never used  Substance and Sexual Activity   Alcohol use: Yes    Comment: 16 oz per day wine   Drug use: No   Sexual activity: Yes  Other Topics Concern   Not on file  Social History Narrative   Not on file   Social Determinants of Health   Financial Resource Strain: Not on file  Food Insecurity: Not on file  Transportation Needs: Not on file  Physical Activity: Not on file  Stress: Not on file  Social Connections: Not on file     Family History:  The patient's family history includes Breast cancer in her maternal aunt and maternal aunt; Cancer in her cousin, cousin, father, maternal grandfather, and maternal grandmother; Cancer (age of onset: 64) in her sister; Cancer (age of onset: 29) in her mother; Hypertension in her brother and sister; Liver cancer in her maternal uncle; Lung cancer in her father and maternal uncle; Throat cancer in her father and maternal uncle.  ROS:   12-point review of systems is negative unless otherwise noted in the HPI.   EKGs/Labs/Other Studies Reviewed:    Studies reviewed were summarized above. The additional studies were reviewed today:  Zio patch 05/2022: Patient had a min HR of 56 bpm, max HR of 194 bpm, and avg HR of 90 bpm. Predominant underlying rhythm was Sinus Rhythm. First Degree AV Block was present.  2  wide-complex tachycardia runs occurred, the run with  the fastest interval lasting 7 beats with a max rate of 193 bpm (avg 179 bpm); the run with the fastest interval was also the longest.  These could be due to ventricular tachycardia versus SVT with aberrancy. Atrial Fibrillation/Flutter occurred (29% burden), ranging from 56-194 bpm (avg of 105 bpm), the longest lasting 1 day 6 hours with an avg rate of 114 bpm. Atrial Fibrillation was present at activation of device. Atrial Fibrillation/Flutter was detected within +/- 45 seconds of symptomatic patient event(s).  Rare PACs and rare PVCs. __________   Carlton Adam MPI 04/30/2022:   The study is normal. The study is low risk.   No ST deviation was noted.   LV perfusion is normal. There is no evidence of ischemia. There is no evidence of infarction.   Left ventricular function is normal. End diastolic cavity size is normal. End systolic cavity size is normal.   CT attenuation images showed mild aortic calcifications. __________   2D echo 02/27/2022: 1. Left ventricular ejection fraction, by estimation, is 65 to 70%. The  left ventricle has normal function. The left ventricle has no regional  wall motion abnormalities. Left ventricular diastolic parameters were  normal.   2. Right ventricular systolic function is normal. The right ventricular  size is normal.   3. The mitral valve is normal in structure. Mild to moderate mitral valve  regurgitation.   4. Tricuspid valve regurgitation is mild to moderate.   5. The aortic valve is normal in structure. Aortic valve regurgitation is  not visualized.  __________   Carotid artery ultrasound 02/27/2022: IMPRESSION: No Doppler evidence of hemodynamically significant (greater than 50%) carotid stenosis, with small amounts of nonstenosing plaque in the carotid bulbs and proximal cervical ICAs, and bilateral antegrade vertebral artery flow. __________   Nuclear stress test 08/01/2018 Jefm Bryant): LVEF= 73 %   FINDINGS:  Regional wall motion:  reveals  normal myocardial thickening and wall  motion.  The overall quality of the study is good.   Artifacts noted: no  Left ventricular cavity: normal.   Perfusion Analysis:  SPECT images demonstrate homogeneous tracer  distribution throughout the myocardium.  __________   2D echo 08/01/2018 Jefm Bryant): INTERPRETATION  NORMAL LEFT VENTRICULAR SYSTOLIC FUNCTION   WITH MILD LVH  NORMAL RIGHT VENTRICULAR SYSTOLIC FUNCTION  MILD VALVULAR REGURGITATION (See above)  NO VALVULAR STENOSIS  MILD MR, TR, PR  EF 55%  __________   06/29/2016 Jefm Bryant): LVEF= 65 %   FINDINGS:  Regional wall motion:  reveals normal myocardial thickening and wall  motion.  The overall quality of the study is good.   Artifacts noted: no  Left ventricular cavity: normal.   Perfusion Analysis:  SPECT images demonstrate homogeneous tracer  distribution throughout the myocardium.   __________   2D echo 06/29/2016 Jefm Bryant): INTERPRETATION  NORMAL LEFT VENTRICULAR SYSTOLIC FUNCTION WITH AN ESTIMATED EF = 55 %  NORMAL RIGHT VENTRICULAR SYSTOLIC FUNCTION  MODERATE MITRAL VALVE INSUFFICIENCY  MILD TRICUSPID VALVE INSUFFICIENCY  NO VALVULAR STENOSIS    EKG:  EKG is ordered today.  The EKG ordered today demonstrates A-fib with slow ventricular response, 57 bpm, nonspecific ST-T changes  Recent Labs: 02/27/2022: ALT 30 02/28/2022: B Natriuretic Peptide 562.4 03/02/2022: BUN 13; Creatinine, Ser 1.11; Hemoglobin 12.5; Platelets 140; Potassium 3.6; Sodium 137 06/29/2022: Magnesium 1.9  Recent Lipid Panel    Component Value Date/Time   CHOL 197 04/03/2019 1026   TRIG 116 04/03/2019 1026   HDL 50 04/03/2019 1026  CHOLHDL 3.9 04/03/2019 1026   VLDL 23 04/03/2019 1026   LDLCALC 124 (H) 04/03/2019 1026    PHYSICAL EXAM:    VS:  BP (!) 108/52 (BP Location: Right Arm, Patient Position: Sitting, Cuff Size: Normal)   Pulse (!) 57   Ht '5\' 3"'$  (1.6 m)   Wt 200 lb (90.7 kg)   SpO2 98%   BMI 35.43 kg/m   BMI: Body mass  index is 35.43 kg/m.  Physical Exam Vitals reviewed.  Constitutional:      Appearance: She is well-developed.  HENT:     Head: Normocephalic and atraumatic.     Comments: Contusion noted along the upper posterior scalp with mild tenderness to palpation and no active bleeding.  Mild soft tissue swelling.  No C-spine tenderness to palpation.  She does have some mid thoracic and lumbar tenderness to palpation. Eyes:     General:        Right eye: No discharge.        Left eye: No discharge.  Neck:     Vascular: No JVD.  Cardiovascular:     Rate and Rhythm: Bradycardia present. Rhythm irregularly irregular.     Heart sounds: Normal heart sounds, S1 normal and S2 normal. Heart sounds not distant. No midsystolic click and no opening snap. No murmur heard.    No friction rub.  Pulmonary:     Effort: Pulmonary effort is normal. No respiratory distress.     Breath sounds: Normal breath sounds. No decreased breath sounds, wheezing or rales.  Chest:     Chest wall: No tenderness.  Abdominal:     General: There is no distension.  Musculoskeletal:     Cervical back: Normal range of motion.  Skin:    General: Skin is warm and dry.     Nails: There is no clubbing.  Neurological:     Mental Status: She is alert and oriented to person, place, and time.  Psychiatric:        Speech: Speech normal.        Behavior: Behavior normal.        Thought Content: Thought content normal.        Judgment: Judgment normal.     Wt Readings from Last 3 Encounters:  07/26/22 200 lb (90.7 kg)  07/19/22 192 lb 12.8 oz (87.5 kg)  06/28/22 188 lb 6.4 oz (85.5 kg)     ASSESSMENT & PLAN:   Mechanical fall with head contusion and thoracic/lumbar pain: Alert and oriented x 3.  Schedule stat head CT and cervical spine without contrast.  If abnormal, patient will need to present to the ED.  No cervical spine tenderness to palpation.  Defer evaluation and follow-up of thoracic and lumbar pain to  PCP.  PAF/flutter: She remains in A-fib with slow ventricular response.  She did not convert following the addition of metoprolol.  Given prior first-degree AV block, and in the context of slow ventricular response, we will discontinue Toprol-XL.  She remains on Cardizem CD and flecainide.  If there is no significant intracranial bleed on a stat head CT we will continue to plan for previously scheduled DCCV on 08/06/2022.  She has not missed any doses of apixaban, and does not meet reduced dosing criteria.  CHA2DS2-VASc at least 3.  If, following DCCV, she maintains sinus rhythm we will need to pursue flecainide treadmill in follow-up.  If she does not maintain sinus rhythm would recommend follow-up with EP for further discussion of antiarrhythmic therapy.  Exertional dyspnea: Overall stable.  Lexiscan MPI without evidence of ischemia with echo showing preserved LV systolic function.  This was not discussed in detail today given the above.  HTN: Blood pressure is well-controlled in the office today.  Orthostatics negative.  Discontinue Toprol-XL as outlined above with continuation of Cardizem CD.  OSA: CPAP.  This was not discussed in detail at today's visit.    Disposition: F/u with Dr. Fletcher Anon or an APP after DCCV, and EP as directed.   Medication Adjustments/Labs and Tests Ordered: Current medicines are reviewed at length with the patient today.  Concerns regarding medicines are outlined above. Medication changes, Labs and Tests ordered today are summarized above and listed in the Patient Instructions accessible in Encounters.   Signed, Christell Faith, PA-C 07/26/2022 3:15 PM     [facility] [address] [place] [address] [place], [place] [address] [phone]

## 2022-07-26 ENCOUNTER — Ambulatory Visit
Admission: RE | Admit: 2022-07-26 | Discharge: 2022-07-26 | Disposition: A | Discharge status: Home / Self Care | Payer: Medicare HMO | Source: Ambulatory Visit | Attending: Physician Assistant | Admitting: Physician Assistant

## 2022-07-26 ENCOUNTER — Ambulatory Visit: Payer: Medicare HMO | Attending: Physician Assistant | Admitting: Physician Assistant

## 2022-07-26 ENCOUNTER — Encounter: Payer: Self-pay | Admitting: Physician Assistant

## 2022-07-26 ENCOUNTER — Telehealth: Payer: Self-pay | Admitting: Cardiovascular Disease

## 2022-07-26 VITALS — BP 108/52 | HR 57 | Ht 63.0 in | Wt 200.0 lb

## 2022-07-26 DIAGNOSIS — M50322 Other cervical disc degeneration at C5-C6 level: Secondary | ICD-10-CM | POA: Diagnosis not present

## 2022-07-26 DIAGNOSIS — M545 Low back pain, unspecified: Secondary | ICD-10-CM | POA: Diagnosis not present

## 2022-07-26 DIAGNOSIS — S0003XA Contusion of scalp, initial encounter: Secondary | ICD-10-CM | POA: Insufficient documentation

## 2022-07-26 DIAGNOSIS — Z7901 Long term (current) use of anticoagulants: Secondary | ICD-10-CM | POA: Insufficient documentation

## 2022-07-26 DIAGNOSIS — W19XXXA Unspecified fall, initial encounter: Secondary | ICD-10-CM | POA: Diagnosis not present

## 2022-07-26 DIAGNOSIS — M4313 Spondylolisthesis, cervicothoracic region: Secondary | ICD-10-CM | POA: Diagnosis not present

## 2022-07-26 DIAGNOSIS — I4892 Unspecified atrial flutter: Secondary | ICD-10-CM | POA: Diagnosis not present

## 2022-07-26 DIAGNOSIS — S0093XA Contusion of unspecified part of head, initial encounter: Secondary | ICD-10-CM | POA: Diagnosis not present

## 2022-07-26 DIAGNOSIS — I10 Essential (primary) hypertension: Secondary | ICD-10-CM | POA: Diagnosis not present

## 2022-07-26 DIAGNOSIS — J3489 Other specified disorders of nose and nasal sinuses: Secondary | ICD-10-CM | POA: Insufficient documentation

## 2022-07-26 DIAGNOSIS — I480 Paroxysmal atrial fibrillation: Secondary | ICD-10-CM | POA: Diagnosis not present

## 2022-07-26 DIAGNOSIS — R0609 Other forms of dyspnea: Secondary | ICD-10-CM | POA: Diagnosis not present

## 2022-07-26 DIAGNOSIS — M546 Pain in thoracic spine: Secondary | ICD-10-CM | POA: Diagnosis not present

## 2022-07-26 DIAGNOSIS — Z043 Encounter for examination and observation following other accident: Secondary | ICD-10-CM | POA: Diagnosis not present

## 2022-07-26 NOTE — Patient Instructions (Addendum)
Medication Instructions:  Your physician has recommended you make the following change in your medication:   STOP Metoprolol  *If you need a refill on your cardiac medications before your next appointment, please call your pharmacy*   Lab Work: None  If you have labs (blood work) drawn today and your tests are completely normal, you will receive your results only by: [place] (if you have MyChart) OR A paper copy in the mail If you have any lab test that is abnormal or we need to change your treatment, we will call you to review the results.   Testing/Procedures:  STAT CT Head STAT CT Cervical Spine   Follow-Up: At [facility], you and your health needs are our priority.  As part of our continuing mission to provide you with exceptional heart care, we have created designated Provider Care Teams.  These Care Teams include your primary Cardiologist (physician) and Advanced Practice Providers (APPs -  Physician Assistants and Nurse Practitioners) who all work together to provide you with the care you need, when you need it.   Your next appointment:   3 week(s)  The format for your next appointment:   In Person  Provider:   Kathlyn Sacramento, MD or Christell Faith, PA-C        Important Information About Sugar

## 2022-07-26 NOTE — Addendum Note (Signed)
Addended by: Valora Corporal on: 07/26/2022 03:19 PM   Modules accepted: Orders

## 2022-07-26 NOTE — Telephone Encounter (Signed)
LMOV to schedule 3 week follow up appointment from 07/26/22 checkout

## 2022-07-30 ENCOUNTER — Other Ambulatory Visit: Payer: Self-pay | Admitting: Family Medicine

## 2022-07-30 DIAGNOSIS — S0990XA Unspecified injury of head, initial encounter: Secondary | ICD-10-CM

## 2022-08-06 ENCOUNTER — Ambulatory Visit
Admission: RE | Admit: 2022-08-06 | Discharge: 2022-08-06 | Disposition: A | Discharge status: Home / Self Care | Payer: Medicare HMO | Attending: Cardiovascular Disease | Admitting: Cardiovascular Disease

## 2022-08-06 ENCOUNTER — Encounter
Admission: RE | Disposition: A | Discharge status: Home / Self Care | Payer: Self-pay | Source: Home / Self Care | Attending: Cardiovascular Disease

## 2022-08-06 ENCOUNTER — Ambulatory Visit: Payer: Medicare HMO | Admitting: Certified Registered Nurse Anesthetist

## 2022-08-06 ENCOUNTER — Encounter: Payer: Self-pay | Admitting: Cardiovascular Disease

## 2022-08-06 DIAGNOSIS — E669 Obesity, unspecified: Secondary | ICD-10-CM | POA: Diagnosis not present

## 2022-08-06 DIAGNOSIS — E785 Hyperlipidemia, unspecified: Secondary | ICD-10-CM | POA: Insufficient documentation

## 2022-08-06 DIAGNOSIS — W19XXXA Unspecified fall, initial encounter: Secondary | ICD-10-CM | POA: Diagnosis not present

## 2022-08-06 DIAGNOSIS — S0093XA Contusion of unspecified part of head, initial encounter: Secondary | ICD-10-CM | POA: Insufficient documentation

## 2022-08-06 DIAGNOSIS — M545 Low back pain, unspecified: Secondary | ICD-10-CM | POA: Insufficient documentation

## 2022-08-06 DIAGNOSIS — M797 Fibromyalgia: Secondary | ICD-10-CM | POA: Insufficient documentation

## 2022-08-06 DIAGNOSIS — Z9013 Acquired absence of bilateral breasts and nipples: Secondary | ICD-10-CM | POA: Insufficient documentation

## 2022-08-06 DIAGNOSIS — G4733 Obstructive sleep apnea (adult) (pediatric): Secondary | ICD-10-CM | POA: Insufficient documentation

## 2022-08-06 DIAGNOSIS — I480 Paroxysmal atrial fibrillation: Secondary | ICD-10-CM

## 2022-08-06 DIAGNOSIS — G4730 Sleep apnea, unspecified: Secondary | ICD-10-CM | POA: Diagnosis not present

## 2022-08-06 DIAGNOSIS — Z9221 Personal history of antineoplastic chemotherapy: Secondary | ICD-10-CM | POA: Diagnosis not present

## 2022-08-06 DIAGNOSIS — I081 Rheumatic disorders of both mitral and tricuspid valves: Secondary | ICD-10-CM | POA: Diagnosis not present

## 2022-08-06 DIAGNOSIS — M546 Pain in thoracic spine: Secondary | ICD-10-CM | POA: Diagnosis not present

## 2022-08-06 DIAGNOSIS — Z853 Personal history of malignant neoplasm of breast: Secondary | ICD-10-CM | POA: Insufficient documentation

## 2022-08-06 DIAGNOSIS — N1831 Chronic kidney disease, stage 3a: Secondary | ICD-10-CM | POA: Diagnosis not present

## 2022-08-06 DIAGNOSIS — I4892 Unspecified atrial flutter: Secondary | ICD-10-CM | POA: Insufficient documentation

## 2022-08-06 DIAGNOSIS — R0609 Other forms of dyspnea: Secondary | ICD-10-CM | POA: Diagnosis not present

## 2022-08-06 DIAGNOSIS — I4891 Unspecified atrial fibrillation: Secondary | ICD-10-CM | POA: Diagnosis not present

## 2022-08-06 DIAGNOSIS — K219 Gastro-esophageal reflux disease without esophagitis: Secondary | ICD-10-CM | POA: Diagnosis not present

## 2022-08-06 DIAGNOSIS — Z7901 Long term (current) use of anticoagulants: Secondary | ICD-10-CM | POA: Insufficient documentation

## 2022-08-06 DIAGNOSIS — I10 Essential (primary) hypertension: Secondary | ICD-10-CM | POA: Insufficient documentation

## 2022-08-06 DIAGNOSIS — I890 Lymphedema, not elsewhere classified: Secondary | ICD-10-CM | POA: Insufficient documentation

## 2022-08-06 DIAGNOSIS — I4819 Other persistent atrial fibrillation: Secondary | ICD-10-CM | POA: Diagnosis not present

## 2022-08-06 DIAGNOSIS — I129 Hypertensive chronic kidney disease with stage 1 through stage 4 chronic kidney disease, or unspecified chronic kidney disease: Secondary | ICD-10-CM | POA: Diagnosis not present

## 2022-08-06 DIAGNOSIS — Z6835 Body mass index (BMI) 35.0-35.9, adult: Secondary | ICD-10-CM | POA: Diagnosis not present

## 2022-08-06 DIAGNOSIS — M6282 Rhabdomyolysis: Secondary | ICD-10-CM | POA: Diagnosis not present

## 2022-08-06 DIAGNOSIS — M1990 Unspecified osteoarthritis, unspecified site: Secondary | ICD-10-CM | POA: Diagnosis not present

## 2022-08-06 DIAGNOSIS — F32A Depression, unspecified: Secondary | ICD-10-CM | POA: Diagnosis not present

## 2022-08-06 DIAGNOSIS — Z79899 Other long term (current) drug therapy: Secondary | ICD-10-CM | POA: Insufficient documentation

## 2022-08-06 HISTORY — PX: CARDIOVERSION: SHX1299

## 2022-08-06 SURGERY — CARDIOVERSION
Anesthesia: General

## 2022-08-06 MED ORDER — SODIUM CHLORIDE 0.9 % IV SOLN: INTRAVENOUS | Status: DC

## 2022-08-06 MED ORDER — PROPOFOL 10 MG/ML IV BOLUS
INTRAVENOUS | Status: DC | PRN
  Administered 2022-08-06: 20 mg via INTRAVENOUS
  Administered 2022-08-06: 30 mg via INTRAVENOUS
  Administered 2022-08-06: 50 mg via INTRAVENOUS

## 2022-08-06 MED ORDER — PROPOFOL 10 MG/ML IV BOLUS
INTRAVENOUS | Status: AC
  Filled 2022-08-06: qty 20

## 2022-08-06 MED ORDER — LACTATED RINGERS IV SOLN: INTRAVENOUS | Status: DC | PRN

## 2022-08-06 NOTE — Transfer of Care (Signed)
Immediate Anesthesia Transfer of Care Note  Patient: Krystal [name]  Procedure(s) Performed: CARDIOVERSION  Patient Location: Nursing Unit  Anesthesia Type:General  Level of Consciousness: awake, alert , and oriented  Airway & Oxygen Therapy: Patient Spontanous Breathing and Patient connected to nasal cannula oxygen  Post-op Assessment: Report given to RN and Post -op Vital signs reviewed and stable  Post vital signs: Reviewed and stable  Last Vitals:  Vitals Value Taken Time  BP 105/63 08/06/22 0815  Temp    Pulse 78 08/06/22 0817  Resp 17 08/06/22 0817  SpO2 92 % 08/06/22 0817    Last Pain:  Vitals:   08/06/22 0743  TempSrc: Oral         Complications: No notable events documented.

## 2022-08-06 NOTE — Interval H&P Note (Signed)
History and Physical Interval Note:  08/06/2022 8:23 AM  Krystal [name]  has presented today for surgery, with the diagnosis of Cardioversion  Afib.  The various methods of treatment have been discussed with the patient and family. After consideration of risks, benefits and other options for treatment, the patient has consented to  Procedure(s): CARDIOVERSION (N/A) as a surgical intervention.  The patient's history has been reviewed, patient examined, no change in status, stable for surgery.  I have reviewed the patient's chart and labs.  Questions were answered to the patient's satisfaction.     Kathlyn Sacramento

## 2022-08-06 NOTE — Anesthesia Preprocedure Evaluation (Signed)
Anesthesia Evaluation  Patient identified by MRN, date of birth, ID band Patient awake    Reviewed: Allergy & Precautions, H&P , NPO status , Patient's Chart, lab work & pertinent test results  History of Anesthesia Complications Negative for: history of anesthetic complications  Airway Mallampati: II  TM Distance: >3 FB Neck ROM: full    Dental  (+) Teeth Intact, Implants, Caps, Dental Advidsory Given   Pulmonary neg shortness of breath, sleep apnea and Continuous Positive Airway Pressure Ventilation , neg COPD, neg recent URI          Cardiovascular hypertension, (-) angina (-) Past MI and (-) Cardiac Stents + dysrhythmias Atrial Fibrillation (-) Valvular Problems/Murmurs     Neuro/Psych neg Seizures PSYCHIATRIC DISORDERS  Depression    Fibromyalgia  Neuromuscular disease (fibromyalgia)    GI/Hepatic Neg liver ROS,GERD  Controlled,,  Endo/Other  negative endocrine ROS    Renal/GU      Musculoskeletal  (+) Arthritis ,  Fibromyalgia -  Abdominal   Peds  Hematology  (+) Blood dyscrasia, anemia   Anesthesia Other Findings Past Medical History: No date: Anemia No date: Arthritis     Comment:  osteo at birth: Blindness of right eye 2011: Cancer ([place])     Comment:  Left breast 2011 and cervical age 61 No date: Carpal tunnel syndrome No date: Cataract No date: Depression 07/27/2011: Edema No date: Fibromyalgia     Comment:  muscle weakness and pain No date: GERD (gastroesophageal reflux disease) No date: Hyperlipidemia No date: Hypertension     Comment:  benign No date: Lymphedema No date: Osteoporosis No date: Plantar fasciitis No date: Pre-diabetes No date: Raynaud's disease No date: Restless leg syndrome No date: Rosacea No date: Rotator cuff rupture No date: Sleep apnea No date: Synovitis No date: Ulcer  Past Surgical History: 1986: ABDOMINAL HYSTERECTOMY     Comment:  For bleeding and pain 2006:  BLADDER SURGERY     Comment:  Bladder tack 09/17/2012: BREAST RECONSTRUCTION; Bilateral     Comment:  Procedure: BREAST RECONSTRUCTION;  Surgeon: Theodoro Kos, DO;  Location: [place];                Service: Plastics;  Laterality: Bilateral;  BILATERAL               BREAST RECONSTRUCTION WITH TISSUE EXPANDERS AND ALLOMED 06/03/2013: BREAST RECONSTRUCTION; Right     Comment:  Procedure: RIGHT BREAST CAPSULE CONSTRACTURE;  Surgeon:               Theodoro Kos, DO;  Location: [place];               Service: Plastics;  Laterality: Right; 2008: CARPAL TUNNEL RELEASE; Bilateral No date: EYE SURGERY; Right 08/12/2018: INJECTION KNEE; Left     Comment:  Procedure: KNEE INJECTION-LEFT;  Surgeon: Corky Mull,              MD;  Location: [facility];  Service: Orthopedics;                Laterality: Left; No date: JOINT REPLACEMENT 01/29/2013: LIPOSUCTION; Bilateral     Comment:  Procedure: LIPOSUCTION;  Surgeon: Theodoro Kos, DO;                Location: [place];  Service: Plastics;  Laterality: Bilateral; 06/03/2013: LIPOSUCTION; Right     Comment:  Procedure: LIPOSUCTION;  Surgeon: Theodoro Kos, DO;                Location: [place];  Service: Plastics;               Laterality: Right; 2012: MASTECTOMY     Comment:  rt prophalactic mast-snbx 2012: MASTECTOMY MODIFIED RADICAL     Comment:  left-axillary nodes No date: NEUROMA SURGERY; Bilateral 2007: NOSE SURGERY No date: PORT-A-CATH REMOVAL     Comment:  insertion and No date: THROAT SURGERY No date: TONSILLECTOMY 08/12/2018: TOTAL KNEE ARTHROPLASTY; Right     Comment:  Procedure: TOTAL KNEE ARTHROPLASTY-RIGHT;  Surgeon:               Corky Mull, MD;  Location: [facility];  Service:               Orthopedics;  Laterality: Right; No date: UVULOPALATOPHARYNGOPLASTY     Reproductive/Obstetrics negative OB ROS                              Anesthesia Physical Anesthesia Plan  ASA: 3  Anesthesia Plan: General   Post-op Pain Management:    Induction: Intravenous  PONV Risk Score and Plan: 3 and Propofol infusion and TIVA  Airway Management Planned: Natural Airway and Nasal Cannula  Additional Equipment:   Intra-op Plan:   Post-operative Plan:   Informed Consent: I have reviewed the patients History and Physical, chart, labs and discussed the procedure including the risks, benefits and alternatives for the proposed anesthesia with the patient or authorized representative who has indicated his/her understanding and acceptance.     Dental Advisory Given  Plan Discussed with: Anesthesiologist  Anesthesia Plan Comments:         Anesthesia Quick Evaluation

## 2022-08-06 NOTE — CV Procedure (Signed)
Cardioversion note: A standard informed consent was obtained. Timeout was performed. The pads were placed in the anterior posterior fashion. The patient was given propofol by the anesthesia team.  Cardioversion was done with an initial shock of 150J.  She had a pause of about 2 seconds but then noted to be back and atypical atrial flutter.  She was shocked twice after that with 200 J but she continued to be in the same rhythm. Pre-and post EKGs were reviewed. The patient tolerated the procedure with no immediate complications.  Recommendations: Unsuccessful cardioversion.  After the initial shock, she had a 2-second pause but then went back into atypical atrial flutter.  2 subsequent shocks were not successful. Continue same medications for now. Will arrange follow-up in 1 to 2 weeks.  If she remains in atrial fib/flutter, we will plan on discontinuing flecainide and pursue rate control as long as she remains asymptomatic or a different antiarrhythmic medication.

## 2022-08-07 ENCOUNTER — Encounter: Payer: Self-pay | Admitting: Cardiovascular Disease

## 2022-08-08 DIAGNOSIS — M13841 Other specified arthritis, right hand: Secondary | ICD-10-CM | POA: Diagnosis not present

## 2022-08-08 DIAGNOSIS — M13849 Other specified arthritis, unspecified hand: Secondary | ICD-10-CM | POA: Diagnosis not present

## 2022-08-08 DIAGNOSIS — M069 Rheumatoid arthritis, unspecified: Secondary | ICD-10-CM | POA: Diagnosis not present

## 2022-08-08 DIAGNOSIS — M1990 Unspecified osteoarthritis, unspecified site: Secondary | ICD-10-CM | POA: Insufficient documentation

## 2022-08-08 DIAGNOSIS — M720 Palmar fascial fibromatosis [Dupuytren]: Secondary | ICD-10-CM | POA: Diagnosis not present

## 2022-08-08 DIAGNOSIS — M79641 Pain in right hand: Secondary | ICD-10-CM | POA: Diagnosis not present

## 2022-08-08 DIAGNOSIS — I509 Heart failure, unspecified: Secondary | ICD-10-CM | POA: Diagnosis not present

## 2022-08-08 DIAGNOSIS — M79642 Pain in left hand: Secondary | ICD-10-CM | POA: Diagnosis not present

## 2022-08-09 NOTE — Anesthesia Postprocedure Evaluation (Signed)
Anesthesia Post Note  Patient: Krystal [name]  Procedure(s) Performed: CARDIOVERSION  Patient location during evaluation: PACU Anesthesia Type: General Level of consciousness: awake and alert Pain management: pain level controlled Vital Signs Assessment: post-procedure vital signs reviewed and stable Respiratory status: spontaneous breathing, nonlabored ventilation, respiratory function stable and patient connected to nasal cannula oxygen Cardiovascular status: blood pressure returned to baseline and stable Postop Assessment: no apparent nausea or vomiting Anesthetic complications: no   No notable events documented.   Last Vitals:  Vitals:   08/06/22 0830 08/06/22 0845  BP: (!) 109/58 112/70  Pulse: 80 70  Resp: (!) 24 18  Temp:    SpO2: 91% 93%    Last Pain:  Vitals:   08/06/22 0845  TempSrc:   PainSc: 0-No pain                 Martha Clan

## 2022-08-10 ENCOUNTER — Other Ambulatory Visit
Admission: RE | Admit: 2022-08-10 | Discharge: 2022-08-10 | Disposition: A | Discharge status: Home / Self Care | Payer: Medicare HMO | Source: Ambulatory Visit | Attending: Physician Assistant | Admitting: Physician Assistant

## 2022-08-10 DIAGNOSIS — I480 Paroxysmal atrial fibrillation: Secondary | ICD-10-CM | POA: Diagnosis not present

## 2022-08-10 LAB — BASIC METABOLIC PANEL
Anion gap: 10 (ref 5–15)
BUN: 13 mg/dL (ref 8–23)
CO2: 24 mmol/L (ref 22–32)
Calcium: 8.7 mg/dL — ABNORMAL LOW (ref 8.9–10.3)
Chloride: 101 mmol/L (ref 98–111)
Creatinine, Ser: 1.08 mg/dL — ABNORMAL HIGH (ref 0.44–1.00)
GFR, Estimated: 56 mL/min — ABNORMAL LOW (ref >60)
Glucose, Bld: 100 mg/dL — ABNORMAL HIGH (ref 70–99)
Potassium: 3.9 mmol/L (ref 3.5–5.1)
Sodium: 135 mmol/L (ref 135–145)

## 2022-08-10 LAB — CBC
HCT: 36.3 % (ref 36.0–46.0)
Hemoglobin: 12 g/dL (ref 12.0–15.0)
MCH: 28 pg (ref 26.0–34.0)
MCHC: 33.1 g/dL (ref 30.0–36.0)
MCV: 84.8 fL (ref 80.0–100.0)
Platelets: 227 10*3/uL (ref 150–400)
RBC: 4.28 MIL/uL (ref 3.87–5.11)
RDW: 14.6 % (ref 11.5–15.5)
WBC: 6.9 10*3/uL (ref 4.0–10.5)
nRBC: 0 % (ref 0.0–0.2)

## 2022-08-13 ENCOUNTER — Ambulatory Visit: Payer: Medicare HMO | Admitting: Urology

## 2022-08-13 VITALS — BP 130/75 | HR 86 | Ht 63.0 in | Wt 193.0 lb

## 2022-08-13 DIAGNOSIS — R32 Unspecified urinary incontinence: Secondary | ICD-10-CM

## 2022-08-13 LAB — URINALYSIS, COMPLETE
Bilirubin, UA: NEGATIVE
Glucose, UA: NEGATIVE
Ketones, UA: NEGATIVE
Leukocytes,UA: NEGATIVE
Nitrite, UA: NEGATIVE
RBC, UA: NEGATIVE
Specific Gravity, UA: 1.015 (ref 1.005–1.030)
Urobilinogen, Ur: 1 mg/dL (ref 0.2–1.0)
pH, UA: 7.5 (ref 5.0–7.5)

## 2022-08-13 LAB — MICROSCOPIC EXAMINATION: Epithelial Cells (non renal): >10 /hpf — AB (ref 0–10)

## 2022-08-13 MED ORDER — GEMTESA 75 MG PO TABS: 75.0000 mg | ORAL_TABLET | Freq: Every day | ORAL | 3 refills | Status: DC

## 2022-08-13 NOTE — Progress Notes (Signed)
08/13/2022 2:11 PM   Krystal [name] [birth year] [id number]  Referring provider: [name], MD [address] [facility] [place],  [place] [address]  Chief Complaint  Patient presents with   Urinary Incontinence    HPI: I was consulted to assist the patient's urinary incontinence.  She has urge incontinence.  She leaks with coughing sneezing bending.  Both are significant.  She leaks when she gets in and out of her [name] and when she goes from a sitting to standing position.  She can soak up to 5 pads a day.  No bedwetting  She voids every hour but might be able to hold it for 2 hours.  She knows her every bathroom is.  Gets up 4 times a night.  She is on a diuretic.  She has ankle edema  She had a bladder suspension 30 years ago.  She had a hysterectomy.  She is prone to constipation     Well supported bladder neck and no stress incontinence   patient has mixed incontinence.  Call if culture is positive.  She has hourly frequency and significant nocturia and likely has nocturnal diuresis she will return for urodynamics and cystoscopy.  Based upon the pelvic examination and severity of incontinence I would not be surprised if her primary problem is overactive bladder   Today Frequency stable.  Incontinence stable.  Last culture she is Streptococcus in the urine On urodynamics she did not void and was catheterized for 25 mL.  Maximum bladder capacity was 320 mL.  She had increased bladder sensation.  Bladder was unstable reaching pressure of 11 cm of water.  She had urgency but no incontinence.  No stress incontinence with Valsalva pressure of 139 cm of water.  During voiding she voided 245 mL with maximal flow of 7 mils per second.  Maximum voiding pressure 12 cm of water.  Residual 62 mL.  EMG activity mildly increased.  Some of the contractions were provoked with coughing and Valsalva.  She also leaks with positional changes.  She had a long prolonged voiding pattern.  The  details of the urodynamics are signed dictated  Cystoscopy: Patient underwent flexible cystoscopy utilizing sterile technique.  Bladder mucosa and trigone were normal.  No cysts foreign body or carcinoma.  Urine was a little bit cloudy so I sent it for culture again     Patient has mixed incontinence.  Approximately 80% of problem is overactive bladder with triggering with positional changes and standing.  High-volume leakage due to overactive bladder.  Refractory treatments would be the treatment of choice.  A bulking agent is some to consider if she wanted her stress incontinence treated.  Reassess in 6 weeks on Myrbetriq 50 mg samples and prescription.  Call if urine culture positive.  Consider prophylaxis if culture is positive again   Frequency stable Last culture was positive.  Culture prior to this was positive with low count Streptococcus.  She failed Myrbetriq.  Frequency stable   Patient completely dry on Gemtesa and is very happy.  Minimal burning.  Frequency improved.    Today Frequency stable.  Incontinence much better.  Minimal slight burning.  Little bit of constipation well-tolerated   PMH: Past Medical History:  Diagnosis Date   Anemia    Arthritis    osteo   Blindness of right eye at birth   Cancer [facility]) 2011   Left breast 2011 and cervical age 72   Carpal tunnel syndrome    Cataract  Depression    Edema 07/27/2011   Fibromyalgia    muscle weakness and pain   GERD (gastroesophageal reflux disease)    Hyperlipidemia    Hypertension    benign   Lymphedema    Osteoporosis    Plantar fasciitis    Pre-diabetes    Raynaud's disease    Restless leg syndrome    Rosacea    Rotator cuff rupture    Sleep apnea    Synovitis    Ulcer     Surgical History: Past Surgical History:  Procedure Laterality Date   ABDOMINAL HYSTERECTOMY  1986   For bleeding and pain   BLADDER SURGERY  2006   Bladder tack   BREAST RECONSTRUCTION Bilateral 09/17/2012   Procedure:  BREAST RECONSTRUCTION;  Surgeon: Theodoro Kos, DO;  Location: [place];  Service: Plastics;  Laterality: Bilateral;  BILATERAL BREAST RECONSTRUCTION WITH TISSUE EXPANDERS AND ALLOMED   BREAST RECONSTRUCTION Right 06/03/2013   Procedure: RIGHT BREAST CAPSULE CONSTRACTURE;  Surgeon: Theodoro Kos, DO;  Location: [place];  Service: Plastics;  Laterality: Right;   CARDIOVERSION N/A 08/06/2022   Procedure: CARDIOVERSION;  Surgeon: Wellington Hampshire, MD;  Location: [facility];  Service: Cardiovascular;  Laterality: N/A;   CARPAL TUNNEL RELEASE Bilateral 2008   EYE SURGERY Right    INJECTION KNEE Left 08/12/2018   Procedure: KNEE INJECTION-LEFT;  Surgeon: Corky Mull, MD;  Location: [facility];  Service: Orthopedics;  Laterality: Left;   JOINT REPLACEMENT     LIPOSUCTION Bilateral 01/29/2013   Procedure: LIPOSUCTION;  Surgeon: Theodoro Kos, DO;  Location: [place];  Service: Plastics;  Laterality: Bilateral;   LIPOSUCTION Right 06/03/2013   Procedure: LIPOSUCTION;  Surgeon: Theodoro Kos, DO;  Location: [place];  Service: Plastics;  Laterality: Right;   MASTECTOMY  2012   rt prophalactic mast-snbx   MASTECTOMY MODIFIED RADICAL  2012   left-axillary nodes   NEUROMA SURGERY Bilateral    NOSE SURGERY  2007   PORT-A-CATH REMOVAL     insertion and   THROAT SURGERY     TONSILLECTOMY     TOTAL KNEE ARTHROPLASTY Right 08/12/2018   Procedure: TOTAL KNEE ARTHROPLASTY-RIGHT;  Surgeon: Corky Mull, MD;  Location: [facility];  Service: Orthopedics;  Laterality: Right;   TOTAL KNEE ARTHROPLASTY Left 09/22/2019   Procedure: TOTAL KNEE ARTHROPLASTY;  Surgeon: Corky Mull, MD;  Location: [facility];  Service: Orthopedics;  Laterality: Left;   UVULOPALATOPHARYNGOPLASTY      Home Medications:  Allergies as of 08/13/2022       Reactions   Cephalexin Nausea Only   Diaphoresis / Sweating (intolerance)   Levaquin [levofloxacin] Other (See  Comments)   Stiff joints, unable to walk.        Medication List        Accurate as of August 13, 2022  2:11 PM. If you have any questions, ask your nurse or doctor.          albuterol 108 (90 Base) MCG/ACT inhaler Commonly known as: VENTOLIN HFA Inhale 2 puffs into the lungs every 6 (six) hours as needed for wheezing or shortness of breath.   alendronate 70 MG tablet Commonly known as: FOSAMAX Take 70 mg by mouth every Saturday. Take with a full glass of water on an empty stomach.   apixaban 5 MG Tabs tablet Commonly known as: ELIQUIS Take 1 tablet (5 mg total) by mouth 2 (two) times daily.   cetirizine 10 MG tablet  Commonly known as: ZYRTEC Take 10 mg by mouth daily.   cholecalciferol 25 MCG (1000 UNIT) tablet Commonly known as: VITAMIN D3 Take 1,000 Units by mouth every evening.   COSAMIN DS PO Take 1 tablet by mouth 3 (three) times a week. Take as needed   diltiazem 120 MG 24 hr capsule Commonly known as: CARDIZEM CD Take 1 capsule (120 mg) by mouth twice daily   DULoxetine 60 MG capsule Commonly known as: CYMBALTA Take 60 mg by mouth daily.   flecainide 100 MG tablet Commonly known as: TAMBOCOR Take 1 tablet (100 mg total) by mouth 2 (two) times daily.   fluticasone 50 MCG/ACT nasal spray Commonly known as: FLONASE Place 2 sprays into both nostrils daily.   folic acid 1 MG tablet Commonly known as: FOLVITE Take 1 tablet (1 mg total) by mouth daily.   furosemide 20 MG tablet Commonly known as: LASIX Take 20 mg by mouth 2 (two) times daily.   Gemtesa 75 MG Tabs Generic drug: Vibegron Take 75 mg by mouth daily.   levocetirizine 5 MG tablet Commonly known as: XYZAL Take 5 mg by mouth daily.   LYSINE ACETATE PO Take 4,000 mg by mouth daily as needed (cold sores).   magnesium oxide 400 MG tablet Commonly known as: MAG-OX Take 400 mg by mouth daily as needed (leg cramps).   meloxicam 7.5 MG tablet Commonly known as: MOBIC Take 7.5 mg by  mouth daily.   omeprazole 20 MG capsule Commonly known as: PRILOSEC Take 20 mg by mouth 2 (two) times daily before a meal.   potassium chloride 10 MEQ tablet Commonly known as: KLOR-CON M Take 10 mEq by mouth in the morning and at bedtime.   pregabalin 300 MG capsule Commonly known as: LYRICA Take 300 mg by mouth 2 (two) times daily.   rOPINIRole 1 MG tablet Commonly known as: REQUIP Take 1 mg by mouth at bedtime.   triamcinolone 0.025 % cream Commonly known as: KENALOG APPLY 1 APPLICATION TOPICALLY DAILY AS NEEDED (RASH).   Vitamin D (Ergocalciferol) 1.25 MG (50000 UNIT) Caps capsule Commonly known as: DRISDOL Take 50,000 Units by mouth once a week.   zolpidem 10 MG tablet Commonly known as: AMBIEN Take 10 mg by mouth at bedtime.        Allergies:  Allergies  Allergen Reactions   Cephalexin Nausea Only    Diaphoresis / Sweating (intolerance)   Levaquin [Levofloxacin] Other (See Comments)    Stiff joints, unable to walk.    Family History: Family History  Problem Relation Age of Onset   Cancer Mother 47       non hodgkins lymphoma   Cancer Father        neck, throat, lung   Lung cancer Father    Throat cancer Father    Hypertension Sister    Cancer Sister 39       Breast cancer dx'd 04/2011   Hypertension Brother    Breast cancer Maternal Aunt    Liver cancer Maternal Uncle    Cancer Maternal Grandmother        died 6, unknown cancer   Cancer Maternal Grandfather        died in his 14s; unknown cancer   Lung cancer Maternal Uncle    Throat cancer Maternal Uncle    Breast cancer Maternal Aunt    Cancer Cousin        died under the age 48, unknown cancer   Cancer Cousin  died age 71; unknown cancer    Social History:  reports that she has never smoked. She has never been exposed to tobacco smoke. She has never used smokeless tobacco. She reports that she does not currently use alcohol. She reports that she does not use drugs.  ROS:                                         Physical Exam: BP 130/75   Pulse 86   Ht '5\' 3"'$  (1.6 m)   Wt 87.5 kg   BMI 34.19 kg/m   Constitutional:  Alert and oriented, No acute distress. HEENT: [place] AT, moist mucus membranes.  Trachea midline, no masses.   Laboratory Data: Lab Results  Component Value Date   WBC 6.9 08/10/2022   HGB 12.0 08/10/2022   HCT 36.3 08/10/2022   MCV 84.8 08/10/2022   PLT 227 08/10/2022    Lab Results  Component Value Date   CREATININE 1.08 (H) 08/10/2022    No results found for: "PSA"  No results found for: "TESTOSTERONE"  Lab Results  Component Value Date   HGBA1C 5.7 (H) 04/03/2019    Urinalysis    Component Value Date/Time   COLORURINE YELLOW (A) 02/27/2022 0106   APPEARANCEUR Hazy (A) 04/16/2022 1545   LABSPEC 1.012 02/27/2022 0106   LABSPEC 1.005 04/06/2016 0937   PHURINE 5.0 02/27/2022 0106   GLUCOSEU Negative 04/16/2022 1545   GLUCOSEU Negative 04/06/2016 0937   HGBUR SMALL (A) 02/27/2022 0106   BILIRUBINUR Negative 04/16/2022 1545   BILIRUBINUR Negative 04/06/2016 0937   KETONESUR NEGATIVE 02/27/2022 0106   PROTEINUR 1+ (A) 04/16/2022 1545   PROTEINUR NEGATIVE 02/27/2022 0106   UROBILINOGEN 0.2 04/06/2016 0937   NITRITE Negative 04/16/2022 1545   NITRITE NEGATIVE 02/27/2022 0106   LEUKOCYTESUR 1+ (A) 04/16/2022 1545   LEUKOCYTESUR LARGE (A) 02/27/2022 0106   LEUKOCYTESUR Negative 04/06/2016 0937    Pertinent Imaging:   Assessment & Plan: Gemtesa 90 x 3 sent to pharmacy.  Call if culture positive.  See in 1 year  1. Urinary incontinence, unspecified type  - Urinalysis, Complete   No follow-ups on file.  Reece Packer, MD  [facility] [address] [place], [place] [address] [phone]

## 2022-08-13 NOTE — Progress Notes (Unsigned)
Cardiology Office Note    Date:  08/15/2022   ID:  Krystal [name], Krystal [name] [birth year], MRN [id number]  PCP:  [name], MD  Cardiologist:  Kathlyn Sacramento, MD  Electrophysiologist:  Virl Axe, MD   Chief Complaint: Follow-up  History of Present Illness:   Krystal [name] is a 72 y.o. female with history of PAF/flutter diagnosed in 02/2022 status post unsuccessful DCCV in 07/2022, breast cancer s/p bilateral mastectomy and chemotherapy, fibromyalgia, HTN, HLD, arthritis, lymphedema, OSA on CPAP, depression, and GERD who presents for follow up of unsuccessful DCCV.   She was previously followed by Dr. Clayborn Bigness with transition of her care to Dr. Fletcher Anon in 04/2022, as a second opinion regarding management of her Afib.    She was admitted to the hospital in 02/2022 after she was found on the floor by her husband with diarrhea and mental status change.  She was noted to be in A-fib with RVR.  Labs showed AKI and rhabdomyolysis.  She was found to have an E. coli UTI.  A-fib was treated with rate control and diltiazem, and she was started on anticoagulation with apixaban.  Echo showed normal LV systolic function with mild to moderate mitral regurgitation.  Carotid artery ultrasound showed no hemodynamically significant stenoses bilaterally.  Note indicates cardioversion was previously recommended.  However, upon establishing with our office in 04/2022, she was noted to be in sinus rhythm.  She reported exertional dyspnea without chest pain.  Lexiscan MPI in 04/2022 showed no evidence of ischemia or infarction with normal LVSF and was overall low risk.  CT attenuation corrected images showed mild aortic atherosclerosis.  Zio patch, to quantify A-fib burden, showed a predominant rhythm of sinus with an average rate of 90 bpm, 2 WCT runs occurred with the fastest and longest interval lasting 7 beats felt to be due to to NSVT versus SVT with aberrancy, and an overall 29% burden of A-fib/flutter, with episodes  associated with patient triggered events.  She was seen on 06/25/2022 with Cardizem being titrated to 360 mg daily.   She was evaluated by Dr. Caryl Comes on 06/28/2022 with diltiazem being transitioned from 360 mg daily to 120 mg twice daily with initiation of flecainide 100 mg twice daily.   In follow-up on 07/19/2022, she was without symptoms of angina or decompensation.  She continued to note intermittent palpitations and was in atrial flutter with variable AV block in the office.  She remained on apixaban without missing doses.  Toprol-XL 25 mg daily was added with continuation of Cardizem CD and flecainide.   She suffered a mechanical fall on 07/24/2022, reaching for a railing to go up a step into her kitchen, falling backwards landing on her buttocks and back.  She did strike the back of her head on the tile floor leading to a contusion and bleeding.  No LOC, loss of bowel/bladder function, or seizure-like activity.  She did not present to the ED.  She was evaluated by her PCP on 07/25/2021 or who ordered a head CT.    She was last seen in our office on 07/26/2022 and felt like she was back to her baseline, without residual neurological deficit.  She was without symptoms of angina or decompensation.  She remained in A-fib and had not missed any doses of anticoagulation.  Since she remained in A-fib, and in the context of prior first-degree AV block while in sinus rhythm, we discontinued Toprol-XL.  Given mechanical fall with head trauma, she underwent stat head  CT and cervical spine CT which showed no acute intracranial abnormality or evidence of acute post traumatic malalignment of the cervical spine.  She underwent unsuccessful DCCV on 08/06/2022.  After the initial shock, she had a 2-second pause, though went back into atypical atrial flutter.  2 subsequent shocks were not successful.  It was recommended she continue current medical therapy and if she remained in A-fib/flutter to discontinue flecainide and pursue  rate control strategy as long as she remained asymptomatic, or pursue alternative antiarrhythmic therapy under the direction of EP.  She comes in doing well from a cardiac perspective.  No symptoms of angina or decompensation.  Stable dyspnea.  No progressive palpitations, dizziness, presyncope, or syncope.  No further falls.  She does continue to note an increase in fatigue which EP has previously felt is related to her underlying A-fib.  Patient also feels this way.  She remains adherent to CPAP.  No symptoms concerning for progressive bleeding.  Her weight is down 6 pounds today when compared to her visit earlier this month.   Labs independently reviewed: 07/2022 - potassium 3.9, BUN 13, serum creatinine 1.08, Hgb 12.0, PLT 227 06/2022 - magnesium 1.9 05/2022 - albumin 4.4, AST/ALT normal 03/2022 - A1c 6.3 02/2022 - TSH normal 07/2021 - TC 230, TG 139, HDL 53, LDL 132  Past Medical History:  Diagnosis Date   Anemia    Arthritis    osteo   Blindness of right eye at birth   Cancer [facility]) 2011   Left breast 2011 and cervical age 70   Carpal tunnel syndrome    Cataract    Depression    Edema 07/27/2011   Fibromyalgia    muscle weakness and pain   GERD (gastroesophageal reflux disease)    Hyperlipidemia    Hypertension    benign   Lymphedema    Osteoporosis    Plantar fasciitis    Pre-diabetes    Raynaud's disease    Restless leg syndrome    Rosacea    Rotator cuff rupture    Sleep apnea    Synovitis    Ulcer     Past Surgical History:  Procedure Laterality Date   ABDOMINAL HYSTERECTOMY  1986   For bleeding and pain   BLADDER SURGERY  2006   Bladder tack   BREAST RECONSTRUCTION Bilateral 09/17/2012   Procedure: BREAST RECONSTRUCTION;  Surgeon: Theodoro Kos, DO;  Location: [place];  Service: Plastics;  Laterality: Bilateral;  BILATERAL BREAST RECONSTRUCTION WITH TISSUE EXPANDERS AND ALLOMED   BREAST RECONSTRUCTION Right 06/03/2013   Procedure: RIGHT BREAST  CAPSULE CONSTRACTURE;  Surgeon: Theodoro Kos, DO;  Location: [place];  Service: Plastics;  Laterality: Right;   CARDIOVERSION N/A 08/06/2022   Procedure: CARDIOVERSION;  Surgeon: Wellington Hampshire, MD;  Location: [facility];  Service: Cardiovascular;  Laterality: N/A;   CARPAL TUNNEL RELEASE Bilateral 2008   EYE SURGERY Right    INJECTION KNEE Left 08/12/2018   Procedure: KNEE INJECTION-LEFT;  Surgeon: Corky Mull, MD;  Location: [facility];  Service: Orthopedics;  Laterality: Left;   JOINT REPLACEMENT     LIPOSUCTION Bilateral 01/29/2013   Procedure: LIPOSUCTION;  Surgeon: Theodoro Kos, DO;  Location: [place];  Service: Plastics;  Laterality: Bilateral;   LIPOSUCTION Right 06/03/2013   Procedure: LIPOSUCTION;  Surgeon: Theodoro Kos, DO;  Location: [place];  Service: Plastics;  Laterality: Right;   MASTECTOMY  2012   rt prophalactic mast-snbx   MASTECTOMY  MODIFIED RADICAL  2012   left-axillary nodes   NEUROMA SURGERY Bilateral    NOSE SURGERY  2007   PORT-A-CATH REMOVAL     insertion and   THROAT SURGERY     TONSILLECTOMY     TOTAL KNEE ARTHROPLASTY Right 08/12/2018   Procedure: TOTAL KNEE ARTHROPLASTY-RIGHT;  Surgeon: Corky Mull, MD;  Location: [facility];  Service: Orthopedics;  Laterality: Right;   TOTAL KNEE ARTHROPLASTY Left 09/22/2019   Procedure: TOTAL KNEE ARTHROPLASTY;  Surgeon: Corky Mull, MD;  Location: [facility];  Service: Orthopedics;  Laterality: Left;   UVULOPALATOPHARYNGOPLASTY      Current Medications: Current Meds  Medication Sig   albuterol (VENTOLIN HFA) 108 (90 Base) MCG/ACT inhaler Inhale 2 puffs into the lungs every 6 (six) hours as needed for wheezing or shortness of breath.   alendronate (FOSAMAX) 70 MG tablet Take 70 mg by mouth every Saturday. Take with a full glass of water on an empty stomach.    apixaban (ELIQUIS) 5 MG TABS tablet Take 1 tablet (5 mg total) by mouth 2 (two) times daily.   cetirizine  (ZYRTEC) 10 MG tablet Take 10 mg by mouth daily.   cholecalciferol (VITAMIN D3) 25 MCG (1000 UT) tablet Take 1,000 Units by mouth every evening.   diltiazem (CARDIZEM CD) 120 MG 24 hr capsule Take 1 capsule (120 mg) by mouth twice daily   DULoxetine (CYMBALTA) 60 MG capsule Take 60 mg by mouth daily.   fluticasone (FLONASE) 50 MCG/ACT nasal spray Place 2 sprays into both nostrils daily.   folic acid (FOLVITE) 1 MG tablet Take 1 tablet (1 mg total) by mouth daily.   furosemide (LASIX) 20 MG tablet Take 20 mg by mouth 2 (two) times daily.    Glucosamine-Chondroitin (COSAMIN DS PO) Take 1 tablet by mouth 3 (three) times a week. Take as needed   levocetirizine (XYZAL) 5 MG tablet Take 5 mg by mouth daily.   LYSINE ACETATE PO Take 4,000 mg by mouth daily as needed (cold sores).    magnesium oxide (MAG-OX) 400 MG tablet Take 400 mg by mouth daily as needed (leg cramps).   meloxicam (MOBIC) 7.5 MG tablet Take 7.5 mg by mouth daily.   omeprazole (PRILOSEC) 20 MG capsule Take 20 mg by mouth 2 (two) times daily before a meal.   potassium chloride (KLOR-CON) 10 MEQ tablet Take 10 mEq by mouth in the morning and at bedtime.   pregabalin (LYRICA) 300 MG capsule Take 300 mg by mouth 2 (two) times daily.     rOPINIRole (REQUIP) 1 MG tablet Take 1 mg by mouth at bedtime.    triamcinolone (KENALOG) 0.025 % cream APPLY 1 APPLICATION TOPICALLY DAILY AS NEEDED (RASH).   Vibegron (GEMTESA) 75 MG TABS Take 1 tablet (75 mg total) by mouth daily.   Vitamin D, Ergocalciferol, (DRISDOL) 1.25 MG (50000 UNIT) CAPS capsule Take 50,000 Units by mouth once a week.   zolpidem (AMBIEN) 10 MG tablet Take 10 mg by mouth at bedtime.   [DISCONTINUED] flecainide (TAMBOCOR) 100 MG tablet Take 1 tablet (100 mg total) by mouth 2 (two) times daily.    Allergies:   Cephalexin and Levaquin [levofloxacin]   Social History   Socioeconomic History   Marital status: Married    Spouse name: Not on file   Number of children: Not on  file   Years of education: Not on file   Highest education level: Not on file  Occupational History   Not on file  Tobacco Use   Smoking status: Never    Passive exposure: Never   Smokeless tobacco: Never  Vaping Use   Vaping Use: Never used  Substance and Sexual Activity   Alcohol use: Not Currently   Drug use: No   Sexual activity: Yes  Other Topics Concern   Not on file  Social History Narrative   Not on file   Social Determinants of Health   Financial Resource Strain: Not on file  Food Insecurity: Not on file  Transportation Needs: Not on file  Physical Activity: Not on file  Stress: Not on file  Social Connections: Not on file     Family History:  The patient's family history includes Breast cancer in her maternal aunt and maternal aunt; Cancer in her cousin, cousin, father, maternal grandfather, and maternal grandmother; Cancer (age of onset: 69) in her sister; Cancer (age of onset: 43) in her mother; Hypertension in her brother and sister; Liver cancer in her maternal uncle; Lung cancer in her father and maternal uncle; Throat cancer in her father and maternal uncle.  ROS:   12-point review of systems is negative unless otherwise noted in the HPI.   EKGs/Labs/Other Studies Reviewed:    Studies reviewed were summarized above. The additional studies were reviewed today:  Zio patch 05/2022: Patient had a min HR of 56 bpm, max HR of 194 bpm, and avg HR of 90 bpm. Predominant underlying rhythm was Sinus Rhythm. First Degree AV Block was present.  2 wide-complex tachycardia runs occurred, the run with the fastest interval lasting 7 beats with a max rate of 193 bpm (avg 179 bpm); the run with the fastest interval was also the longest.  These could be due to ventricular tachycardia versus SVT with aberrancy. Atrial Fibrillation/Flutter occurred (29% burden), ranging from 56-194 bpm (avg of 105 bpm), the longest lasting 1 day 6 hours with an avg rate of 114 bpm. Atrial  Fibrillation was present at activation of device. Atrial Fibrillation/Flutter was detected within +/- 45 seconds of symptomatic patient event(s).  Rare PACs and rare PVCs. __________   Carlton Adam MPI 04/30/2022:   The study is normal. The study is low risk.   No ST deviation was noted.   LV perfusion is normal. There is no evidence of ischemia. There is no evidence of infarction.   Left ventricular function is normal. End diastolic cavity size is normal. End systolic cavity size is normal.   CT attenuation images showed mild aortic calcifications. __________   2D echo 02/27/2022: 1. Left ventricular ejection fraction, by estimation, is 65 to 70%. The  left ventricle has normal function. The left ventricle has no regional  wall motion abnormalities. Left ventricular diastolic parameters were  normal.   2. Right ventricular systolic function is normal. The right ventricular  size is normal.   3. The mitral valve is normal in structure. Mild to moderate mitral valve  regurgitation.   4. Tricuspid valve regurgitation is mild to moderate.   5. The aortic valve is normal in structure. Aortic valve regurgitation is  not visualized.  __________   Carotid artery ultrasound 02/27/2022: IMPRESSION: No Doppler evidence of hemodynamically significant (greater than 50%) carotid stenosis, with small amounts of nonstenosing plaque in the carotid bulbs and proximal cervical ICAs, and bilateral antegrade vertebral artery flow. __________   Nuclear stress test 08/01/2018 Jefm Bryant): LVEF= 73 %   FINDINGS:  Regional wall motion:  reveals normal myocardial thickening and wall  motion.  The overall quality of the  study is good.   Artifacts noted: no  Left ventricular cavity: normal.   Perfusion Analysis:  SPECT images demonstrate homogeneous tracer  distribution throughout the myocardium.  __________   2D echo 08/01/2018 Jef[facility] Bryant): INTERPRETATION  NORMAL LEFT VENTRICULAR SYSTOLIC FUNCTION   WITH  MILD LVH  NORMAL RIGHT VENTRICULAR SYSTOLIC FUNCTION  MILD VALVULAR REGURGITATION (See above)  NO VALVULAR STENOSIS  MILD MR, TR, PR  EF 55%  __________   06/29/2016 Jefm Bryant): LVEF= 65 %   FINDINGS:  Regional wall motion:  reveals normal myocardial thickening and wall  motion.  The overall quality of the study is good.   Artifacts noted: no  Left ventricular cavity: normal.   Perfusion Analysis:  SPECT images demonstrate homogeneous tracer  distribution throughout the myocardium.   __________   2D echo 06/29/2016 Jefm Bryant): INTERPRETATION  NORMAL LEFT VENTRICULAR SYSTOLIC FUNCTION WITH AN ESTIMATED EF = 55 %  NORMAL RIGHT VENTRICULAR SYSTOLIC FUNCTION  MODERATE MITRAL VALVE INSUFFICIENCY  MILD TRICUSPID VALVE INSUFFICIENCY  NO VALVULAR STENOSIS     EKG:  EKG is ordered today.  The EKG ordered today demonstrates Afib, 67 bpm, incomplete RBBB  Recent Labs: 02/27/2022: ALT 30 02/28/2022: B Natriuretic Peptide 562.4 06/29/2022: Magnesium 1.9 08/10/2022: BUN 13; Creatinine, Ser 1.08; Hemoglobin 12.0; Platelets 227; Potassium 3.9; Sodium 135  Recent Lipid Panel    Component Value Date/Time   CHOL 197 04/03/2019 1026   TRIG 116 04/03/2019 1026   HDL 50 04/03/2019 1026   CHOLHDL 3.9 04/03/2019 1026   VLDL 23 04/03/2019 1026   LDLCALC 124 (H) 04/03/2019 1026    PHYSICAL EXAM:    VS:  BP 138/60 (BP Location: Left Arm, Patient Position: Sitting, Cuff Size: Normal)   Pulse 67   Ht '5\' 3"'$  (1.6 m)   Wt 194 lb 12.8 oz (88.4 kg)   SpO2 95%   BMI 34.51 kg/m   BMI: Body mass index is 34.51 kg/m.  Physical Exam Vitals reviewed.  Constitutional:      Appearance: She is well-developed.  HENT:     Head: Normocephalic and atraumatic.  Eyes:     General:        Right eye: No discharge.        Left eye: No discharge.  Neck:     Vascular: No JVD.  Cardiovascular:     Rate and Rhythm: Normal rate. Rhythm irregularly irregular.     Heart sounds: Normal heart sounds, S1 normal  and S2 normal. Heart sounds not distant. No midsystolic click and no opening snap. No murmur heard.    No friction rub.  Pulmonary:     Effort: Pulmonary effort is normal. No respiratory distress.     Breath sounds: Normal breath sounds. No decreased breath sounds, wheezing or rales.  Chest:     Chest wall: No tenderness.  Abdominal:     General: There is no distension.  Musculoskeletal:     Cervical back: Normal range of motion.  Skin:    General: Skin is warm and dry.     Nails: There is no clubbing.  Neurological:     Mental Status: She is alert and oriented to person, place, and time.  Psychiatric:        Speech: Speech normal.        Behavior: Behavior normal.        Thought Content: Thought content normal.        Judgment: Judgment normal.     Wt Readings from Last 3 Encounters:  08/15/22 194 lb 12.8 oz (88.4 kg)  08/13/22 193 lb (87.5 kg)  07/26/22 200 lb (90.7 kg)     ASSESSMENT & PLAN:   PAF/flutter: She remains in persistent A-fib with controlled ventricular response.  She has failed cardioversion while on flecainide.  Both myself and MD performing DCCV agree she has failed flecainide therapy.  Therefore, we will discontinue this medication.  It appears that she remains symptomatic while in A-fib with some dyspnea and increase in fatigue.  These are symptoms that were present when she was noted to be in A-fib at her last EP visit, which were felt to be related to A-fib.  I do believe that she will feel better in sinus rhythm.  Patient believes this as well.  However, the decision for rhythm control strategy with alternative antiarrhythmic medication is not a straightforward.  There are concerns regarding amiodarone usage given side effect profile, and in the context of patient having being born blind in the right eye.  It is also unclear if she would be a good Tikosyn candidate given underlying renal dysfunction.  Recommend she follow-up with EP to discuss potential  antiarrhythmic medications and/or A-fib ablation, if found to be a candidate.  Continue Cardizem CD for rate control.  Calcium channel blocker is likely leading to some lower extremity swelling as well.  Could consider transitioning to beta-blocker, though will defer to EP.  Given a CHA2DS2-VASc of at least 3 she remains on apixaban 5 mg twice daily and does not meet reduced dosing criteria.  No symptoms concerning for progressive bleeds.  No further falls.  Exertional dyspnea: Overall stable and felt to possibly be exacerbated by underlying A-fib.  Recent Lexiscan MPI and echo showed preserved LV systolic function and no evidence of ischemia.  HTN: Blood pressure is reasonably controlled in the office today.  She remains on Cardizem CD as outlined above.  OSA: Adherent to CPAP.   Disposition: F/u with Dr. Fletcher Anon or an APP in 4 months, and EP at next available appointment to discuss rhythm control strategy.   Medication Adjustments/Labs and Tests Ordered: Current medicines are reviewed at length with the patient today.  Concerns regarding medicines are outlined above. Medication changes, Labs and Tests ordered today are summarized above and listed in the Patient Instructions accessible in Encounters.   SignedChristell Faith, PA-C 08/15/2022 11:16 AM     [address] [place], [place] [id number] [phone]

## 2022-08-15 ENCOUNTER — Encounter: Payer: Self-pay | Admitting: Physician Assistant

## 2022-08-15 ENCOUNTER — Ambulatory Visit: Payer: Medicare HMO | Attending: Physician Assistant | Admitting: Physician Assistant

## 2022-08-15 VITALS — BP 138/60 | HR 67 | Ht 63.0 in | Wt 194.8 lb

## 2022-08-15 DIAGNOSIS — I4819 Other persistent atrial fibrillation: Secondary | ICD-10-CM | POA: Diagnosis not present

## 2022-08-15 DIAGNOSIS — I10 Essential (primary) hypertension: Secondary | ICD-10-CM | POA: Diagnosis not present

## 2022-08-15 DIAGNOSIS — R0609 Other forms of dyspnea: Secondary | ICD-10-CM

## 2022-08-15 DIAGNOSIS — I4892 Unspecified atrial flutter: Secondary | ICD-10-CM

## 2022-08-15 NOTE — Patient Instructions (Signed)
Medication Instructions:  STOP Flecainide  *If you need a refill on your cardiac medications before your next appointment, please call your pharmacy*   Lab Work: None ordered If you have labs (blood work) drawn today and your tests are completely normal, you will receive your results only by: [place] (if you have MyChart) OR A paper copy in the mail If you have any lab test that is abnormal or we need to change your treatment, we will call you to review the results.   Testing/Procedures: None ordered   Follow-Up: At [facility], you and your health needs are our priority.  As part of our continuing mission to provide you with exceptional heart care, we have created designated Provider Care Teams.  These Care Teams include your primary Cardiologist (physician) and Advanced Practice Providers (APPs -  Physician Assistants and Nurse Practitioners) who all work together to provide you with the care you need, when you need it.  We recommend signing up for the patient portal called "MyChart".  Sign up information is provided on this After Visit Summary.  MyChart is used to connect with patients for Virtual Visits (Telemedicine).  Patients are able to view lab/test results, encounter notes, upcoming appointments, etc.  Non-urgent messages can be sent to your provider as well.   To learn more about what you can do with MyChart, go to [email or web address].    Your next appointment:   Follow up with Dr. Caryl Comes, first available Follow up with Christell Faith, PA in 4 months

## 2022-08-16 LAB — CULTURE, URINE COMPREHENSIVE

## 2022-08-16 NOTE — Addendum Note (Signed)
Addended by: [name] Ivanoff D on: 08/16/2022 04:38 PM   Modules accepted: Orders

## 2022-08-25 DIAGNOSIS — Z853 Personal history of malignant neoplasm of breast: Secondary | ICD-10-CM | POA: Diagnosis not present

## 2022-08-25 DIAGNOSIS — Z421 Encounter for breast reconstruction following mastectomy: Secondary | ICD-10-CM | POA: Diagnosis not present

## 2022-09-11 DIAGNOSIS — I440 Atrioventricular block, first degree: Secondary | ICD-10-CM | POA: Insufficient documentation

## 2022-09-11 DIAGNOSIS — H9203 Otalgia, bilateral: Secondary | ICD-10-CM | POA: Diagnosis not present

## 2022-09-11 DIAGNOSIS — M26623 Arthralgia of bilateral temporomandibular joint: Secondary | ICD-10-CM | POA: Diagnosis not present

## 2022-09-13 ENCOUNTER — Ambulatory Visit: Payer: Medicare HMO | Attending: Internal Medicine | Admitting: Internal Medicine

## 2022-09-13 ENCOUNTER — Encounter: Payer: Self-pay | Admitting: Internal Medicine

## 2022-09-13 VITALS — BP 150/88 | HR 92 | Ht 63.0 in | Wt 183.0 lb

## 2022-09-13 DIAGNOSIS — I480 Paroxysmal atrial fibrillation: Secondary | ICD-10-CM | POA: Diagnosis not present

## 2022-09-13 DIAGNOSIS — I440 Atrioventricular block, first degree: Secondary | ICD-10-CM | POA: Diagnosis not present

## 2022-09-13 MED ORDER — PROPAFENONE HCL ER 325 MG PO CP12: 325.0000 mg | ORAL_CAPSULE | Freq: Two times a day (BID) | ORAL | 3 refills | Status: DC

## 2022-09-13 NOTE — H&P (View-Only) (Signed)
      Patient Care Team: [name], [name], MD as PCP - General (Family Medicine) Arida, Muhammad A, MD as PCP - Cardiology (Cardiology) Nycere Presley C, MD as PCP - Electrophysiology (Cardiology)   HPI  Krystal [name] is a 71 y.o. female seen in follow-up for atrial fibrillation which presented in the context of urosepsis altered mental status rhabdomyolysis complicated by acute kidney injury. Started on flecainide 100 twice daily and down titration of her diltiazem at the last visit  Recurrent atrial flutter requiring cardioversion 1/24 flecainide with subsequent discontinued after reversion again to atrial fibrillation Mostly unaware of it  Interval mechanical fall with head trauma   History of exuberant alcohol intake but almost nothing  Struggles with pain which could be mitigated by alcohol  DATE TEST EF    8/23 Echo   65 % MR mild--Mod Normal LA size   10/23 Myoview      % No ischemia             Date Cr K Mg Hgb  8/23 1.11 3.6 1.6 12.5  11/23 0.9 3.6             DATE PR interval QRSduration PQRS Dose  12/23  208 86 194 0           Thromboembolic risk factors ( age -1, HTN-1, Gender-1) for a CHADSVASc Score of >=3   Treated sleep apnea Records and Results Reviewed   Past Medical History:  Diagnosis Date   Anemia    Arthritis    osteo   Blindness of right eye at birth   Cancer (HCC) 2011   Left breast 2011 and cervical age 32   Carpal tunnel syndrome    Cataract    Depression    Edema 07/27/2011   Fibromyalgia    muscle weakness and pain   GERD (gastroesophageal reflux disease)    Hyperlipidemia    Hypertension    benign   Lymphedema    Osteoporosis    Plantar fasciitis    Pre-diabetes    Raynaud's disease    Restless leg syndrome    Rosacea    Rotator cuff rupture    Sleep apnea    Synovitis    Ulcer     Past Surgical History:  Procedure Laterality Date   ABDOMINAL HYSTERECTOMY  1986   For bleeding and pain   BLADDER SURGERY  2006    Bladder tack   BREAST RECONSTRUCTION Bilateral 09/17/2012   Procedure: BREAST RECONSTRUCTION;  Surgeon: Claire Sanger, DO;  Location: [facility];  Service: Plastics;  Laterality: Bilateral;  BILATERAL BREAST RECONSTRUCTION WITH TISSUE EXPANDERS AND ALLOMED   BREAST RECONSTRUCTION Right 06/03/2013   Procedure: RIGHT BREAST CAPSULE CONSTRACTURE;  Surgeon: Claire Sanger, DO;  Location: [facility];  Service: Plastics;  Laterality: Right;   CARDIOVERSION N/A 08/06/2022   Procedure: CARDIOVERSION;  Surgeon: Arida, Muhammad A, MD;  Location: [facility];  Service: Cardiovascular;  Laterality: N/A;   CARPAL TUNNEL RELEASE Bilateral 2008   EYE SURGERY Right    INJECTION KNEE Left 08/12/2018   Procedure: KNEE INJECTION-LEFT;  Surgeon: Poggi, John J, MD;  Location: [facility];  Service: Orthopedics;  Laterality: Left;   JOINT REPLACEMENT     LIPOSUCTION Bilateral 01/29/2013   Procedure: LIPOSUCTION;  Surgeon: Claire Sanger, DO;  Location: [facility];  Service: Plastics;  Laterality: Bilateral;   LIPOSUCTION Right 06/03/2013   Procedure: LIPOSUCTION;  Surgeon: Claire Sanger, DO;  Location:   [facility];  Service: Plastics;  Laterality: Right;   MASTECTOMY  2012   rt prophalactic mast-snbx   MASTECTOMY MODIFIED RADICAL  2012   left-axillary nodes   NEUROMA SURGERY Bilateral    NOSE SURGERY  2007   PORT-A-CATH REMOVAL     insertion and   THROAT SURGERY     TONSILLECTOMY     TOTAL KNEE ARTHROPLASTY Right 08/12/2018   Procedure: TOTAL KNEE ARTHROPLASTY-RIGHT;  Surgeon: Poggi, John J, MD;  Location: [facility];  Service: Orthopedics;  Laterality: Right;   TOTAL KNEE ARTHROPLASTY Left 09/22/2019   Procedure: TOTAL KNEE ARTHROPLASTY;  Surgeon: Poggi, John J, MD;  Location: [facility];  Service: Orthopedics;  Laterality: Left;   UVULOPALATOPHARYNGOPLASTY      Current Meds  Medication Sig   albuterol (VENTOLIN HFA) 108 (90 Base) MCG/ACT inhaler Inhale 2  puffs into the lungs every 6 (six) hours as needed for wheezing or shortness of breath.   alendronate (FOSAMAX) 70 MG tablet Take 70 mg by mouth every Saturday. Take with a full glass of water on an empty stomach.    apixaban (ELIQUIS) 5 MG TABS tablet Take 1 tablet (5 mg total) by mouth 2 (two) times daily.   cetirizine (ZYRTEC) 10 MG tablet Take 10 mg by mouth daily.   cholecalciferol (VITAMIN D3) 25 MCG (1000 UT) tablet Take 1,000 Units by mouth every evening.   diltiazem (CARDIZEM CD) 120 MG 24 hr capsule Take 1 capsule (120 mg) by mouth twice daily   DULoxetine (CYMBALTA) 60 MG capsule Take 60 mg by mouth daily.   fluticasone (FLONASE) 50 MCG/ACT nasal spray Place 2 sprays into both nostrils daily.   furosemide (LASIX) 20 MG tablet Take 20 mg by mouth 2 (two) times daily.    Glucosamine-Chondroitin (COSAMIN DS PO) Take 1 tablet by mouth 3 (three) times a week. Take as needed   levocetirizine (XYZAL) 5 MG tablet Take 5 mg by mouth daily.   LYSINE ACETATE PO Take 4,000 mg by mouth daily as needed (cold sores).    magnesium oxide (MAG-OX) 400 MG tablet Take 400 mg by mouth daily as needed (leg cramps).   meloxicam (MOBIC) 7.5 MG tablet Take 7.5 mg by mouth daily.   omeprazole (PRILOSEC) 20 MG capsule Take 20 mg by mouth 2 (two) times daily before a meal.   potassium chloride (KLOR-CON) 10 MEQ tablet Take 10 mEq by mouth in the morning and at bedtime.   pregabalin (LYRICA) 300 MG capsule Take 300 mg by mouth 2 (two) times daily.     rOPINIRole (REQUIP) 1 MG tablet Take 1 mg by mouth at bedtime.    triamcinolone (KENALOG) 0.025 % cream APPLY 1 APPLICATION TOPICALLY DAILY AS NEEDED (RASH).   Vibegron (GEMTESA) 75 MG TABS Take 1 tablet (75 mg total) by mouth daily.   Vitamin D, Ergocalciferol, (DRISDOL) 1.25 MG (50000 UNIT) CAPS capsule Take 50,000 Units by mouth once a week.   zolpidem (AMBIEN) 10 MG tablet Take 10 mg by mouth at bedtime.    Allergies  Allergen Reactions   Cephalexin Nausea  Only    Diaphoresis / Sweating (intolerance)   Levaquin [Levofloxacin] Other (See Comments)    Stiff joints, unable to walk.      Review of Systems negative except from HPI and PMH  Physical Exam BP (!) 150/88 (BP Location: Left Arm, Patient Position: Sitting, Cuff Size: Normal)   Pulse 92   Ht 5' 3" (1.6 m)   Wt 183 lb (  83 kg)   SpO2 97%   BMI 32.42 kg/m  Well developed and well nourished in no acute distress HENT normal E scleral and icterus clear Neck Supple JVP flat; carotids brisk and full Clear to ausculatio Irregularly Irregular rate and rhythm with fast ventricular response Soft with active bowel sounds No clubbing cyanosis Trace Edema Alert and oriented, grossly normal motor and sensory function Skin Warm and Dry  ECG ***  CrCl cannot be calculated (Patient's most recent lab result is older than the maximum 21 days allowed.).   Assessment and  Plan  Atrial fibrillation-persistent   First-degree AV block   History of excessive alcohol intake   Memory issues  Lymphedema   Bilateral hand dexterity weakness  Recurrent persistent atrial fibrillation.  Will try propafenone 325 twice daily and repeat cardioversion.  Will also refer her to Dr. CL for consideration of catheter ablation, alcohol intake now restrained  Mildly volume overloaded.  Will get hard to assess in the antics of her lymphedema.  We will change her furosemide from 20 twice daily--Daily and will need to be met in a couple of weeks thereafter (probably done at the time of cardioversion    Current medicines are reviewed at length with the patient today .  The patient does not*** have concerns regarding medicines.  

## 2022-09-13 NOTE — Patient Instructions (Signed)
Medication Instructions:  Your physician has recommended you make the following change in your medication:   ** Begin Propafenone - 1 capsule by mouth twice daily.  ** Take your Furosemide 30m - 2 tablets by mouth every morning  *If you need a refill on your cardiac medications before your next appointment, please call your pharmacy*   Lab Work: None ordered. today If you have labs (blood work) drawn today and your tests are completely normal, you will receive your results only by: [place](if you have MyChart) OR A paper copy in the mail If you have any lab test that is abnormal or we need to change your treatment, we will call you to review the results.   Testing/Procedures: Your physician has recommended that you have a Cardioversion (DCCV). Electrical Cardioversion uses a jolt of electricity to your heart either through paddles or wired patches attached to your chest. This is a controlled, usually prescheduled, procedure. Defibrillation is done under light anesthesia in the hospital, and you usually go home the day of the procedure. This is done to get your heart back into a normal rhythm. You are not awake for the procedure. Please see the instruction sheet given to you today.     Follow-Up: [facility] and your health needs are our priority.  As part of our continuing mission to provide you with exceptional heart care, we have created designated Provider Care Teams.  These Care Teams include your primary Cardiologist (physician) and Advanced Practice Providers (APPs -  Physician Assistants and Nurse Practitioners) who all work together to provide you with the care you need, when you need it.  We recommend signing up for the patient portal called "MyChart".  Sign up information is provided on this After Visit Summary.  MyChart is used to connect with patients for Virtual Visits (Telemedicine).  Patients are able to view lab/test results, encounter notes,  upcoming appointments, etc.  Non-urgent messages can be sent to your provider as well.   To learn more about what you can do with MyChart, go to [email or web address]    Your next appointment:   To be scheduled  Other Instructions You are scheduled for a Cardioversion on Tuesday, October 02, 2022 with Dr. ESaunders Revel    [place] [address] [place] [address] am/pm (This is 1 hour prior to your procedure time).  Proceed to the Check-In Desk directly inside the entrance.  Procedure Parking: Use the entrance off of the [address] of the hospital. Turn right upon entering and follow the driveway to parking that is directly in front of the [place] There is no valet parking available at this entrance, however there is an awning directly in front of the [place]for drop off/ pick up for patients  DIET: Nothing to eat or drink after midnight except a sip of water with medications (see medication instructions below)  Medication Instructions: Hold Furosemide Continue your anticoagulant: Eliquis If you miss a dose, please call u[place]at [phone] will need to continue your anticoagulant after your procedure until you are told by your provider that it is safe to stop.   Labs: [facility].  FYI: For your safety, and to allow u[place] monitor your vital signs accurately during the surgery/procedure we request that if you have artificial nails, gel coating,  SNS etc. Please have those removed prior to your surgery/procedure. Not having the nail coverings /polish removed may result in cancellation or delay of your surgery/procedure.  You must have a responsible person to drive you home and stay in the waiting area during your procedure. Failure to do so could result in cancellation.  Bring your insurance cards.  If you have any questions after you  get home, please call the office at [phone]  *Special Note: Every effort is made to have your procedure done on time. Occasionally there are emergencies that occur at the hospital that may cause delays. Please be patient if a delay does occur.

## 2022-09-13 NOTE — Progress Notes (Signed)
Patient Care Team: [name], MD as PCP - General (Family Medicine) Wellington Hampshire, MD as PCP - Cardiology (Cardiology) Deboraha Sprang, MD as PCP - Electrophysiology (Cardiology)   HPI  Krystal [name] is a 72 y.o. female seen in follow-up for atrial fibrillation which presented in the context of urosepsis altered mental status rhabdomyolysis complicated by acute kidney injury. Started on flecainide 100 twice daily and down titration of her diltiazem at the last visit  Recurrent atrial flutter requiring cardioversion 1/24 flecainide with subsequent discontinued after reversion again to atrial fibrillation Mostly unaware of it  Interval mechanical fall with head trauma   History of exuberant alcohol intake but almost nothing  Struggles with pain which could be mitigated by alcohol  DATE TEST EF    8/23 Echo   65 % MR mild--Mod Normal LA size   10/23 Myoview      % No ischemia             Date Cr K Mg Hgb  8/23 1.11 3.6 1.6 12.5  11/23 0.9 3.6             DATE PR interval QRSduration PQRS Dose  12/23  208 86 194 0           Thromboembolic risk factors ( age -61, HTN-1, Gender-1) for a CHADSVASc Score of >=3   Treated sleep apnea Records and Results Reviewed   Past Medical History:  Diagnosis Date   Anemia    Arthritis    osteo   Blindness of right eye at birth   Cancer [facility]) 2011   Left breast 2011 and cervical age 47   Carpal tunnel syndrome    Cataract    Depression    Edema 07/27/2011   Fibromyalgia    muscle weakness and pain   GERD (gastroesophageal reflux disease)    Hyperlipidemia    Hypertension    benign   Lymphedema    Osteoporosis    Plantar fasciitis    Pre-diabetes    Raynaud's disease    Restless leg syndrome    Rosacea    Rotator cuff rupture    Sleep apnea    Synovitis    Ulcer     Past Surgical History:  Procedure Laterality Date   ABDOMINAL HYSTERECTOMY  1986   For bleeding and pain   BLADDER SURGERY  2006    Bladder tack   BREAST RECONSTRUCTION Bilateral 09/17/2012   Procedure: BREAST RECONSTRUCTION;  Surgeon: Theodoro Kos, DO;  Location: [place];  Service: Plastics;  Laterality: Bilateral;  BILATERAL BREAST RECONSTRUCTION WITH TISSUE EXPANDERS AND ALLOMED   BREAST RECONSTRUCTION Right 06/03/2013   Procedure: RIGHT BREAST CAPSULE CONSTRACTURE;  Surgeon: Theodoro Kos, DO;  Location: [place];  Service: Plastics;  Laterality: Right;   CARDIOVERSION N/A 08/06/2022   Procedure: CARDIOVERSION;  Surgeon: Wellington Hampshire, MD;  Location: [facility];  Service: Cardiovascular;  Laterality: N/A;   CARPAL TUNNEL RELEASE Bilateral 2008   EYE SURGERY Right    INJECTION KNEE Left 08/12/2018   Procedure: KNEE INJECTION-LEFT;  Surgeon: Corky Mull, MD;  Location: [facility];  Service: Orthopedics;  Laterality: Left;   JOINT REPLACEMENT     LIPOSUCTION Bilateral 01/29/2013   Procedure: LIPOSUCTION;  Surgeon: Theodoro Kos, DO;  Location: [place];  Service: Plastics;  Laterality: Bilateral;   LIPOSUCTION Right 06/03/2013   Procedure: LIPOSUCTION;  Surgeon: Theodoro Kos, DO;  Location:  [place];  Service: Plastics;  Laterality: Right;   MASTECTOMY  2012   rt prophalactic mast-snbx   MASTECTOMY MODIFIED RADICAL  2012   left-axillary nodes   NEUROMA SURGERY Bilateral    NOSE SURGERY  2007   PORT-A-CATH REMOVAL     insertion and   THROAT SURGERY     TONSILLECTOMY     TOTAL KNEE ARTHROPLASTY Right 08/12/2018   Procedure: TOTAL KNEE ARTHROPLASTY-RIGHT;  Surgeon: Corky Mull, MD;  Location: [facility];  Service: Orthopedics;  Laterality: Right;   TOTAL KNEE ARTHROPLASTY Left 09/22/2019   Procedure: TOTAL KNEE ARTHROPLASTY;  Surgeon: Corky Mull, MD;  Location: [facility];  Service: Orthopedics;  Laterality: Left;   UVULOPALATOPHARYNGOPLASTY      Current Meds  Medication Sig   albuterol (VENTOLIN HFA) 108 (90 Base) MCG/ACT inhaler Inhale 2  puffs into the lungs every 6 (six) hours as needed for wheezing or shortness of breath.   alendronate (FOSAMAX) 70 MG tablet Take 70 mg by mouth every Saturday. Take with a full glass of water on an empty stomach.    apixaban (ELIQUIS) 5 MG TABS tablet Take 1 tablet (5 mg total) by mouth 2 (two) times daily.   cetirizine (ZYRTEC) 10 MG tablet Take 10 mg by mouth daily.   cholecalciferol (VITAMIN D3) 25 MCG (1000 UT) tablet Take 1,000 Units by mouth every evening.   diltiazem (CARDIZEM CD) 120 MG 24 hr capsule Take 1 capsule (120 mg) by mouth twice daily   DULoxetine (CYMBALTA) 60 MG capsule Take 60 mg by mouth daily.   fluticasone (FLONASE) 50 MCG/ACT nasal spray Place 2 sprays into both nostrils daily.   furosemide (LASIX) 20 MG tablet Take 20 mg by mouth 2 (two) times daily.    Glucosamine-Chondroitin (COSAMIN DS PO) Take 1 tablet by mouth 3 (three) times a week. Take as needed   levocetirizine (XYZAL) 5 MG tablet Take 5 mg by mouth daily.   LYSINE ACETATE PO Take 4,000 mg by mouth daily as needed (cold sores).    magnesium oxide (MAG-OX) 400 MG tablet Take 400 mg by mouth daily as needed (leg cramps).   meloxicam (MOBIC) 7.5 MG tablet Take 7.5 mg by mouth daily.   omeprazole (PRILOSEC) 20 MG capsule Take 20 mg by mouth 2 (two) times daily before a meal.   potassium chloride (KLOR-CON) 10 MEQ tablet Take 10 mEq by mouth in the morning and at bedtime.   pregabalin (LYRICA) 300 MG capsule Take 300 mg by mouth 2 (two) times daily.     rOPINIRole (REQUIP) 1 MG tablet Take 1 mg by mouth at bedtime.    triamcinolone (KENALOG) 0.025 % cream APPLY 1 APPLICATION TOPICALLY DAILY AS NEEDED (RASH).   Vibegron (GEMTESA) 75 MG TABS Take 1 tablet (75 mg total) by mouth daily.   Vitamin D, Ergocalciferol, (DRISDOL) 1.25 MG (50000 UNIT) CAPS capsule Take 50,000 Units by mouth once a week.   zolpidem (AMBIEN) 10 MG tablet Take 10 mg by mouth at bedtime.    Allergies  Allergen Reactions   Cephalexin Nausea  Only    Diaphoresis / Sweating (intolerance)   Levaquin [Levofloxacin] Other (See Comments)    Stiff joints, unable to walk.      Review of Systems negative except from HPI and PMH  Physical Exam BP (!) 150/88 (BP Location: Left Arm, Patient Position: Sitting, Cuff Size: Normal)   Pulse 92   Ht '5\' 3"'$  (1.6 m)   Wt 183 lb (  83 kg)   SpO2 97%   BMI 32.42 kg/m  Well developed and well nourished in no acute distress HENT normal E scleral and icterus clear Neck Supple JVP flat; carotids brisk and full Clear to ausculatio Irregularly Irregular rate and rhythm with fast ventricular response Soft with active bowel sounds No clubbing cyanosis Trace Edema Alert and oriented, grossly normal motor and sensory function Skin Warm and Dry  ECG ***  CrCl cannot be calculated (Patient's most recent lab result is older than the maximum 21 days allowed.).   Assessment and  Plan  Atrial fibrillation-persistent   First-degree AV block   History of excessive alcohol intake   Memory issues  Lymphedema   Bilateral hand dexterity weakness  Recurrent persistent atrial fibrillation.  Will try propafenone 325 twice daily and repeat cardioversion.  Will also refer her to Dr. Daune Perch for consideration of catheter ablation, alcohol intake now restrained  Mildly volume overloaded.  Will get hard to assess in the antics of her lymphedema.  We will change her furosemide from 20 twice daily--Daily and will need to be met in a couple of weeks thereafter (probably done at the time of cardioversion    Current medicines are reviewed at length with the patient today .  The patient does not*** have concerns regarding medicines.

## 2022-09-27 ENCOUNTER — Telehealth: Payer: Self-pay

## 2022-09-27 ENCOUNTER — Ambulatory Visit: Payer: Medicare HMO | Admitting: Cardiovascular Disease

## 2022-09-27 NOTE — Telephone Encounter (Signed)
Returned Pt's call asking for call back. Pt states that she actually has questions about her father's care, not her own. Father sees Dr. Irene Limbo, instructed Pt that I will reach out to Dr. Grier Mitts nurse and have them give call back. Krystal [name] verbalized understanding.

## 2022-09-28 ENCOUNTER — Other Ambulatory Visit
Admission: RE | Admit: 2022-09-28 | Discharge: 2022-09-28 | Disposition: A | Discharge status: Home / Self Care | Payer: Medicare HMO | Attending: Internal Medicine | Admitting: Internal Medicine

## 2022-09-28 DIAGNOSIS — I480 Paroxysmal atrial fibrillation: Secondary | ICD-10-CM | POA: Diagnosis not present

## 2022-09-28 DIAGNOSIS — I440 Atrioventricular block, first degree: Secondary | ICD-10-CM | POA: Diagnosis not present

## 2022-09-28 LAB — BASIC METABOLIC PANEL
Anion gap: 9 (ref 5–15)
BUN: 12 mg/dL (ref 8–23)
CO2: 25 mmol/L (ref 22–32)
Calcium: 9.2 mg/dL (ref 8.9–10.3)
Chloride: 102 mmol/L (ref 98–111)
Creatinine, Ser: 1.15 mg/dL — ABNORMAL HIGH (ref 0.44–1.00)
GFR, Estimated: 52 mL/min — ABNORMAL LOW (ref >60)
Glucose, Bld: 101 mg/dL — ABNORMAL HIGH (ref 70–99)
Potassium: 3.7 mmol/L (ref 3.5–5.1)
Sodium: 136 mmol/L (ref 135–145)

## 2022-09-28 LAB — CBC
HCT: 40.9 % (ref 36.0–46.0)
Hemoglobin: 13.4 g/dL (ref 12.0–15.0)
MCH: 27.6 pg (ref 26.0–34.0)
MCHC: 32.8 g/dL (ref 30.0–36.0)
MCV: 84.3 fL (ref 80.0–100.0)
Platelets: 266 10*3/uL (ref 150–400)
RBC: 4.85 MIL/uL (ref 3.87–5.11)
RDW: 14.4 % (ref 11.5–15.5)
WBC: 6.8 10*3/uL (ref 4.0–10.5)
nRBC: 0 % (ref 0.0–0.2)

## 2022-10-01 DIAGNOSIS — I480 Paroxysmal atrial fibrillation: Secondary | ICD-10-CM | POA: Diagnosis not present

## 2022-10-01 DIAGNOSIS — Z9181 History of falling: Secondary | ICD-10-CM | POA: Diagnosis not present

## 2022-10-01 DIAGNOSIS — M79642 Pain in left hand: Secondary | ICD-10-CM | POA: Diagnosis not present

## 2022-10-01 DIAGNOSIS — M1990 Unspecified osteoarthritis, unspecified site: Secondary | ICD-10-CM | POA: Diagnosis not present

## 2022-10-01 DIAGNOSIS — Z636 Dependent relative needing care at home: Secondary | ICD-10-CM | POA: Diagnosis not present

## 2022-10-01 DIAGNOSIS — E119 Type 2 diabetes mellitus without complications: Secondary | ICD-10-CM | POA: Diagnosis not present

## 2022-10-01 DIAGNOSIS — E785 Hyperlipidemia, unspecified: Secondary | ICD-10-CM | POA: Diagnosis not present

## 2022-10-01 DIAGNOSIS — M79641 Pain in right hand: Secondary | ICD-10-CM | POA: Diagnosis not present

## 2022-10-01 DIAGNOSIS — I10 Essential (primary) hypertension: Secondary | ICD-10-CM | POA: Diagnosis not present

## 2022-10-02 ENCOUNTER — Ambulatory Visit: Payer: Medicare HMO | Admitting: Anesthesiology

## 2022-10-02 ENCOUNTER — Encounter: Payer: Self-pay | Admitting: Internal Medicine

## 2022-10-02 ENCOUNTER — Ambulatory Visit
Admission: RE | Admit: 2022-10-02 | Discharge: 2022-10-02 | Disposition: A | Discharge status: Home / Self Care | Payer: Medicare HMO | Attending: Internal Medicine | Admitting: Internal Medicine

## 2022-10-02 ENCOUNTER — Encounter
Admission: RE | Disposition: A | Discharge status: Home / Self Care | Payer: Self-pay | Source: Home / Self Care | Attending: Internal Medicine

## 2022-10-02 DIAGNOSIS — H5461 Unqualified visual loss, right eye, normal vision left eye: Secondary | ICD-10-CM | POA: Diagnosis not present

## 2022-10-02 DIAGNOSIS — M797 Fibromyalgia: Secondary | ICD-10-CM | POA: Insufficient documentation

## 2022-10-02 DIAGNOSIS — Z8541 Personal history of malignant neoplasm of cervix uteri: Secondary | ICD-10-CM | POA: Insufficient documentation

## 2022-10-02 DIAGNOSIS — K219 Gastro-esophageal reflux disease without esophagitis: Secondary | ICD-10-CM | POA: Diagnosis not present

## 2022-10-02 DIAGNOSIS — I4819 Other persistent atrial fibrillation: Secondary | ICD-10-CM | POA: Diagnosis not present

## 2022-10-02 DIAGNOSIS — I4892 Unspecified atrial flutter: Secondary | ICD-10-CM

## 2022-10-02 DIAGNOSIS — Z6831 Body mass index (BMI) 31.0-31.9, adult: Secondary | ICD-10-CM | POA: Insufficient documentation

## 2022-10-02 DIAGNOSIS — I129 Hypertensive chronic kidney disease with stage 1 through stage 4 chronic kidney disease, or unspecified chronic kidney disease: Secondary | ICD-10-CM | POA: Diagnosis not present

## 2022-10-02 DIAGNOSIS — R531 Weakness: Secondary | ICD-10-CM | POA: Diagnosis not present

## 2022-10-02 DIAGNOSIS — Z79899 Other long term (current) drug therapy: Secondary | ICD-10-CM | POA: Diagnosis not present

## 2022-10-02 DIAGNOSIS — Z7901 Long term (current) use of anticoagulants: Secondary | ICD-10-CM | POA: Insufficient documentation

## 2022-10-02 DIAGNOSIS — I440 Atrioventricular block, first degree: Secondary | ICD-10-CM | POA: Diagnosis not present

## 2022-10-02 DIAGNOSIS — N183 Chronic kidney disease, stage 3 unspecified: Secondary | ICD-10-CM | POA: Diagnosis not present

## 2022-10-02 DIAGNOSIS — M1990 Unspecified osteoarthritis, unspecified site: Secondary | ICD-10-CM | POA: Diagnosis not present

## 2022-10-02 DIAGNOSIS — Z86718 Personal history of other venous thrombosis and embolism: Secondary | ICD-10-CM | POA: Insufficient documentation

## 2022-10-02 DIAGNOSIS — G4730 Sleep apnea, unspecified: Secondary | ICD-10-CM | POA: Insufficient documentation

## 2022-10-02 DIAGNOSIS — R7303 Prediabetes: Secondary | ICD-10-CM | POA: Insufficient documentation

## 2022-10-02 DIAGNOSIS — I890 Lymphedema, not elsewhere classified: Secondary | ICD-10-CM | POA: Diagnosis not present

## 2022-10-02 DIAGNOSIS — N1832 Chronic kidney disease, stage 3b: Secondary | ICD-10-CM | POA: Diagnosis not present

## 2022-10-02 DIAGNOSIS — Z853 Personal history of malignant neoplasm of breast: Secondary | ICD-10-CM | POA: Diagnosis not present

## 2022-10-02 DIAGNOSIS — E669 Obesity, unspecified: Secondary | ICD-10-CM | POA: Diagnosis not present

## 2022-10-02 DIAGNOSIS — F32A Depression, unspecified: Secondary | ICD-10-CM | POA: Diagnosis not present

## 2022-10-02 DIAGNOSIS — I7300 Raynaud's syndrome without gangrene: Secondary | ICD-10-CM | POA: Diagnosis not present

## 2022-10-02 HISTORY — PX: CARDIOVERSION: SHX1299

## 2022-10-02 SURGERY — CARDIOVERSION
Anesthesia: General

## 2022-10-02 MED ORDER — ACETAMINOPHEN 160 MG/5ML PO SOLN: 325.0000 mg | ORAL | Status: DC | PRN

## 2022-10-02 MED ORDER — PROPOFOL 10 MG/ML IV BOLUS
INTRAVENOUS | Status: DC | PRN
  Administered 2022-10-02: 10 mg via INTRAVENOUS
  Administered 2022-10-02: 60 mg via INTRAVENOUS

## 2022-10-02 MED ORDER — ONDANSETRON HCL 4 MG/2ML IJ SOLN: 4.0000 mg | Freq: Once | INTRAMUSCULAR | Status: DC | PRN

## 2022-10-02 MED ORDER — ACETAMINOPHEN 325 MG PO TABS: 650.0000 mg | ORAL_TABLET | Freq: Once | ORAL | Status: DC | PRN

## 2022-10-02 MED ORDER — DILTIAZEM HCL ER COATED BEADS 120 MG PO CP24: 120.0000 mg | ORAL_CAPSULE | Freq: Every day | ORAL | 3 refills | Status: DC

## 2022-10-02 MED ORDER — SODIUM CHLORIDE 0.9 % IV SOLN: INTRAVENOUS | Status: DC

## 2022-10-02 NOTE — Anesthesia Postprocedure Evaluation (Signed)
Anesthesia Post Note  Patient: Krystal [name]  Procedure(s) Performed: CARDIOVERSION  Patient location during evaluation: PACU Anesthesia Type: General Level of consciousness: awake and alert, oriented and patient cooperative Pain management: pain level controlled Vital Signs Assessment: post-procedure vital signs reviewed and stable Respiratory status: spontaneous breathing, nonlabored ventilation and respiratory function stable Cardiovascular status: blood pressure returned to baseline and stable Postop Assessment: adequate PO intake Anesthetic complications: no   No notable events documented.   Last Vitals:  Vitals:   10/02/22 0815 10/02/22 0830  BP: 113/64 110/60  Pulse:    Resp: 18 18  Temp:    SpO2: 96% 95%    Last Pain:  Vitals:   10/02/22 0830  TempSrc:   PainSc: 0-No pain                 Darrin Nipper

## 2022-10-02 NOTE — Transfer of Care (Signed)
Immediate Anesthesia Transfer of Care Note  Patient: Krystal [name]  Procedure(s) Performed: CARDIOVERSION  Patient Location:  Special Procedures  Anesthesia Type:General  Level of Consciousness: drowsy  Airway & Oxygen Therapy: Patient Spontanous Breathing and Patient connected to nasal cannula oxygen  Post-op Assessment: Report given to RN and Post -op Vital signs reviewed and stable  Post vital signs: Reviewed and stable  Last Vitals:  Vitals Value Taken Time  BP 117/69 10/02/22 0742  Temp    Pulse 74 10/02/22 0743  Resp 13 10/02/22 0743  SpO2 94 % 10/02/22 0743    Last Pain:  Vitals:   10/02/22 0707  TempSrc: Oral  PainSc: 0-No pain         Complications: No notable events documented.

## 2022-10-02 NOTE — Anesthesia Preprocedure Evaluation (Addendum)
Anesthesia Evaluation  Patient identified by MRN, date of birth, ID band Patient awake    Reviewed: Allergy & Precautions, NPO status , Patient's Chart, lab work & pertinent test results  History of Anesthesia Complications Negative for: history of anesthetic complications  Airway Mallampati: III       Dental no notable dental hx.    Pulmonary sleep apnea and Continuous Positive Airway Pressure Ventilation    Pulmonary exam normal breath sounds clear to auscultation       Cardiovascular hypertension, + dysrhythmias (a fib on Eliquis)  Rhythm:Irregular Rate:Normal  Remote hx DVT (in 20s associated with OCP)  Myocardial perfusion 04/30/22:    The study is normal. The study is low risk.   No ST deviation was noted.   LV perfusion is normal. There is no evidence of ischemia. There is no evidence of infarction.   Left ventricular function is normal. End diastolic cavity size is normal. End systolic cavity size is normal.   CT attenuation images showed mild aortic calcifications.    Neuro/Psych  PSYCHIATRIC DISORDERS  Depression    Right eye blindness; hx alcohol use disorder, none in one year    GI/Hepatic ,GERD  ,,  Endo/Other  Obesity; prediabetes  Renal/GU Renal disease (stage III CKD)     Musculoskeletal  (+) Arthritis ,  Fibromyalgia -Raynauds   Abdominal   Peds  Hematology  (+) Blood dyscrasia, anemia Breast and cervical cancers   Anesthesia Other Findings Cardiology note 09/13/22:  Assessment and  Plan   Atrial fibrillation-persistent   First-degree AV block   History of excessive alcohol intake   Memory issues   Lymphedema   Bilateral hand dexterity weakness   Recurrent persistent atrial fibrillation.  Will try propafenone 325 twice daily and repeat cardioversion.  Will also refer her to Dr. Daune Perch for consideration of catheter ablation, alcohol intake now restrained   Mildly volume overloaded.   Will get hard to assess in the antics of her lymphedema.  We will change her furosemide from 20 twice daily--Daily and will need to be met in a couple of weeks thereafter (probably done at the time of cardioversion   Reproductive/Obstetrics                             Anesthesia Physical Anesthesia Plan  ASA: 3  Anesthesia Plan: General   Post-op Pain Management:    Induction: Intravenous  PONV Risk Score and Plan: 3 and Propofol infusion, TIVA and Treatment may vary due to age or medical condition  Airway Management Planned: Natural Airway  Additional Equipment:   Intra-op Plan:   Post-operative Plan:   Informed Consent: I have reviewed the patients History and Physical, chart, labs and discussed the procedure including the risks, benefits and alternatives for the proposed anesthesia with the patient or authorized representative who has indicated his/her understanding and acceptance.       Plan Discussed with: CRNA  Anesthesia Plan Comments: (LMA/GETA backup discussed.  Patient consented for risks of anesthesia including but not limited to:  - adverse reactions to medications - damage to eyes, teeth, lips or other oral mucosa - nerve damage due to positioning  - sore throat or hoarseness - damage to heart, brain, nerves, lungs, other parts of body or loss of life  Informed patient about role of CRNA in peri- and intra-operative care.  Patient voiced understanding.)        Anesthesia Quick Evaluation

## 2022-10-02 NOTE — CV Procedure (Signed)
    Cardioversion Note  Krystal [name] [id number] 13-Nov-1950  Procedure: DC Cardioversion Indications: Atrial flutter  Procedure Details Consent: Obtained Time Out: Verified patient identification, verified procedure, site/side was marked, verified correct patient position, special equipment/implants available, Radiology Safety Procedures followed,  medications/allergies/relevent history reviewed, required imaging and test results available.  Performed  The patient has been on adequate anticoagulation.  The patient received IV propofol by anesthesia for sedation.  Synchronous cardioversion was performed at 75 and 200 joules.  The cardioversion was successful after the second shock with conversion to normal sinus rhythm with 1st degree AV block.  Complications: No apparent complications Patient did tolerate procedure well.  Recommendations: Decrease diltiazem to 120 mg daily.  Continue current doses of propafenone 325 mg twice daily and apixaban 5 mg twice daily.  Nelva Bush., MD 10/02/2022, 7:48 AM

## 2022-10-02 NOTE — Interval H&P Note (Signed)
History and Physical Interval Note:  10/02/2022 7:30 AM  Krystal [name]  has presented today for surgery, with the diagnosis of persistent atrial fibrillation.  The various methods of treatment have been discussed with the patient and family. After consideration of risks, benefits and other options for treatment, the patient has consented to  Procedure(s): CARDIOVERSION (N/A) as a surgical intervention.  The patient's history has been reviewed, patient examined, no change in status, stable for surgery.  I have reviewed the patient's chart and labs.  Questions were answered to the patient's satisfaction.     [name]

## 2022-10-03 ENCOUNTER — Encounter: Payer: Self-pay | Admitting: Internal Medicine

## 2022-10-04 ENCOUNTER — Ambulatory Visit: Payer: Medicare HMO | Admitting: Internal Medicine

## 2022-10-16 DIAGNOSIS — D2262 Melanocytic nevi of left upper limb, including shoulder: Secondary | ICD-10-CM | POA: Diagnosis not present

## 2022-10-16 DIAGNOSIS — D225 Melanocytic nevi of trunk: Secondary | ICD-10-CM | POA: Diagnosis not present

## 2022-10-16 DIAGNOSIS — Z85828 Personal history of other malignant neoplasm of skin: Secondary | ICD-10-CM | POA: Diagnosis not present

## 2022-10-16 DIAGNOSIS — D485 Neoplasm of uncertain behavior of skin: Secondary | ICD-10-CM | POA: Diagnosis not present

## 2022-10-16 DIAGNOSIS — D2261 Melanocytic nevi of right upper limb, including shoulder: Secondary | ICD-10-CM | POA: Diagnosis not present

## 2022-10-16 DIAGNOSIS — D2272 Melanocytic nevi of left lower limb, including hip: Secondary | ICD-10-CM | POA: Diagnosis not present

## 2022-10-31 ENCOUNTER — Other Ambulatory Visit: Payer: Self-pay

## 2022-10-31 DIAGNOSIS — L720 Epidermal cyst: Secondary | ICD-10-CM | POA: Diagnosis not present

## 2022-10-31 DIAGNOSIS — L821 Other seborrheic keratosis: Secondary | ICD-10-CM | POA: Diagnosis not present

## 2022-11-05 ENCOUNTER — Encounter: Payer: Self-pay | Admitting: Gastroenterology

## 2022-11-05 ENCOUNTER — Ambulatory Visit: Payer: Medicare HMO | Admitting: Gastroenterology

## 2022-11-05 VITALS — BP 124/79 | HR 108 | Temp 98.3°F | Ht 63.0 in | Wt 174.2 lb

## 2022-11-05 DIAGNOSIS — I7300 Raynaud's syndrome without gangrene: Secondary | ICD-10-CM | POA: Insufficient documentation

## 2022-11-05 DIAGNOSIS — M810 Age-related osteoporosis without current pathological fracture: Secondary | ICD-10-CM | POA: Insufficient documentation

## 2022-11-05 DIAGNOSIS — R143 Flatulence: Secondary | ICD-10-CM | POA: Diagnosis not present

## 2022-11-05 DIAGNOSIS — R7989 Other specified abnormal findings of blood chemistry: Secondary | ICD-10-CM

## 2022-11-05 NOTE — Patient Instructions (Addendum)
Gave Ibguard samples and if they help you can get this medication at a pharmacy over the counter.  I got your RUQ ultrasound schedule for you for 10/15/2022 arrive at 8:45am for a 9:00am scan. Nothing to eat or drink after midnight. The [facility] is schedule [facility]  . If this appointment does not work for you then you can reschedule it at [phone] option 3 and then option 2.

## 2022-11-05 NOTE — Progress Notes (Signed)
Arlyss Repress, MD [address]  [address]  [place], [place] [address]  Main: [phone]  Fax: [phone]    Gastroenterology Consultation  Referring Provider:     Jerl Mina, MD Primary Care Physician:  Jerl Mina, MD Primary Gastroenterologist:  Dr. Arlyss Repress Reason for Consultation: Flatulence, abdominal bloating        HPI:   Krystal [name] is a 72 y.o. female referred by Dr. Jerl Mina, MD  for consultation & management of chronic GERD.  Patient has history of A-fib, recently underwent cardioversion on 10/02/2022, history of hypertension presents with 2 years history of flatulence.  The patient reports that she passes gas frequently, states that she "literally farts all the time".  She admits to having bowel movement about 2-3 times a week when she takes MiraLAX as needed.  She also reports abdominal bloating.  She has been drinking 4 cups of coffee with creamer and artificial sweetener 4 times daily for last 2 years.  She takes care of her 66 year old father who is dealing with cancer and undergoing radiation treatment.  She denies any rectal bleeding, weight loss.  She had mildly elevated AST in the past, ultrasound in 2020 revealed fatty liver as well as gallbladder sludge.  She does not smoke or drink alcohol  NSAIDs: None  Antiplts/Anticoagulants/Anti thrombotics: Eliquis for history of A-fib  GI Procedures: Reports undergoing colonoscopy by gastroenterologist in [place] approximately 3 years ago, small polyp was removed and she was told that she does not need a colonoscopy anymore  Past Medical History:  Diagnosis Date   Anemia    Arthritis    osteo   Blindness of right eye at birth   Cancer [facility]) 2011   Left breast 2011 and cervical age 74   Carpal tunnel syndrome    Cataract    Depression    Edema 07/27/2011   Fibromyalgia    muscle weakness and pain   GERD (gastroesophageal reflux disease)    Hyperlipidemia    Hypertension     benign   Lymphedema    Osteoporosis    Plantar fasciitis    Pre-diabetes    Raynaud's disease    Restless leg syndrome    Rosacea    Rotator cuff rupture    Sleep apnea    Synovitis    Ulcer     Past Surgical History:  Procedure Laterality Date   ABDOMINAL HYSTERECTOMY  1986   For bleeding and pain   BLADDER SURGERY  2006   Bladder tack   BREAST RECONSTRUCTION Bilateral 09/17/2012   Procedure: BREAST RECONSTRUCTION;  Surgeon: Wayland Denis, DO;  Location: [facility];  Service: Plastics;  Laterality: Bilateral;  BILATERAL BREAST RECONSTRUCTION WITH TISSUE EXPANDERS AND ALLOMED   BREAST RECONSTRUCTION Right 06/03/2013   Procedure: RIGHT BREAST CAPSULE CONSTRACTURE;  Surgeon: Wayland Denis, DO;  Location: [facility];  Service: Plastics;  Laterality: Right;   CARDIOVERSION N/A 08/06/2022   Procedure: CARDIOVERSION;  Surgeon: Iran Ouch, MD;  Location: [facility];  Service: Cardiovascular;  Laterality: N/A;   CARDIOVERSION N/A 10/02/2022   Procedure: CARDIOVERSION;  Surgeon: Yvonne Kendall, MD;  Location: [facility];  Service: Cardiovascular;  Laterality: N/A;   CARPAL TUNNEL RELEASE Bilateral 2008   EYE SURGERY Right    INJECTION KNEE Left 08/12/2018   Procedure: KNEE INJECTION-LEFT;  Surgeon: Christena Flake, MD;  Location: [facility];  Service: Orthopedics;  Laterality: Left;   JOINT REPLACEMENT  LIPOSUCTION Bilateral 01/29/2013   Procedure: LIPOSUCTION;  Surgeon: Wayland Denis, DO;  Location: [facility];  Service: Plastics;  Laterality: Bilateral;   LIPOSUCTION Right 06/03/2013   Procedure: LIPOSUCTION;  Surgeon: Wayland Denis, DO;  Location: [facility];  Service: Plastics;  Laterality: Right;   MASTECTOMY  2012   rt prophalactic mast-snbx   MASTECTOMY MODIFIED RADICAL  2012   left-axillary nodes   NEUROMA SURGERY Bilateral    NOSE SURGERY  2007   PORT-A-CATH REMOVAL     insertion and   THROAT SURGERY      TONSILLECTOMY     TOTAL KNEE ARTHROPLASTY Right 08/12/2018   Procedure: TOTAL KNEE ARTHROPLASTY-RIGHT;  Surgeon: Christena Flake, MD;  Location: [facility];  Service: Orthopedics;  Laterality: Right;   TOTAL KNEE ARTHROPLASTY Left 09/22/2019   Procedure: TOTAL KNEE ARTHROPLASTY;  Surgeon: Christena Flake, MD;  Location: [facility];  Service: Orthopedics;  Laterality: Left;   UVULOPALATOPHARYNGOPLASTY       Current Outpatient Medications:    albuterol (VENTOLIN HFA) 108 (90 Base) MCG/ACT inhaler, Inhale 2 puffs into the lungs every 6 (six) hours as needed for wheezing or shortness of breath., Disp: 8 g, Rfl: 2   apixaban (ELIQUIS) 5 MG TABS tablet, Take 1 tablet (5 mg total) by mouth 2 (two) times daily., Disp: 60 tablet, Rfl: 2   finasteride (PROSCAR) 5 MG tablet, Take 5 mg by mouth daily., Disp: , Rfl:    gabapentin (NEURONTIN) 300 MG capsule, Take 300 mg by mouth at bedtime., Disp: , Rfl:    losartan (COZAAR) 25 MG tablet, Take 25 mg by mouth daily., Disp: , Rfl:    metoprolol tartrate (LOPRESSOR) 25 MG tablet, Take 25 mg by mouth 2 (two) times daily., Disp: , Rfl:    omeprazole (PRILOSEC) 40 MG capsule, Take 40 mg by mouth daily., Disp: , Rfl:    rosuvastatin (CRESTOR) 5 MG tablet, Take 5 mg by mouth daily., Disp: , Rfl:    Family History  Problem Relation Age of Onset   Cancer Mother 83       non hodgkins lymphoma   Cancer Father        neck, throat, lung   Lung cancer Father    Throat cancer Father    Hypertension Sister    Cancer Sister 18       Breast cancer dx'd 04/2011   Hypertension Brother    Breast cancer Maternal Aunt    Liver cancer Maternal Uncle    Cancer Maternal Grandmother        died 18, unknown cancer   Cancer Maternal Grandfather        died in his 55s; unknown cancer   Lung cancer Maternal Uncle    Throat cancer Maternal Uncle    Breast cancer Maternal Aunt    Cancer Cousin        died under the age 66, unknown cancer   Cancer Cousin        died age 1; unknown  cancer     Social History   Tobacco Use   Smoking status: Never    Passive exposure: Never   Smokeless tobacco: Never  Vaping Use   Vaping Use: Never used  Substance Use Topics   Alcohol use: Not Currently   Drug use: No    Allergies as of 11/05/2022 - Review Complete 11/05/2022  Allergen Reaction Noted   Cephalexin Nausea Only 09/28/2014   Levaquin [levofloxacin] Other (See Comments) 11/28/2014  Review of Systems:    All systems reviewed and negative except where noted in HPI.   Physical Exam:  BP 124/79 (BP Location: Left Arm, Patient Position: Sitting, Cuff Size: Normal)   Pulse (!) 108   Temp 98.3 F (36.8 C) (Oral)   Ht 5\' 3"  (1.6 m)   Wt 174 lb 4 oz (79 kg)   BMI 30.87 kg/m  No LMP recorded. Patient has had a hysterectomy.  General:   Alert,  Well-developed, well-nourished, pleasant and cooperative in NAD Head:  Normocephalic and atraumatic. Eyes:  Sclera clear, no icterus.   Conjunctiva pink. Ears:  Normal auditory acuity. Nose:  No deformity, discharge, or lesions. Mouth:  No deformity or lesions,oropharynx pink & moist. Neck:  Supple; no masses or thyromegaly. Lungs:  Respirations even and unlabored.  Clear throughout to auscultation.   No wheezes, crackles, or rhonchi. No acute distress. Heart:  Regular rate and rhythm; no murmurs, clicks, rubs, or gallops. Abdomen:  Normal bowel sounds. Soft, non-tender and moderately distended, tympanic without masses, hepatosplenomegaly or hernias noted.  No guarding or rebound tenderness.   Rectal: Not performed Msk:  Symmetrical without gross deformities. Good, equal movement & strength bilaterally. Pulses:  Normal pulses noted. Extremities:  No clubbing or edema.  No cyanosis. Neurologic:  Alert and oriented x3;  grossly normal neurologically. Skin:  Intact without significant lesions or rashes. No jaundice. Psych:  Alert and cooperative. Normal mood and affect.  Imaging Studies: Reviewed  Assessment and  Plan:   Krystal [name] is a 72 y.o. female with hypertension, A-fib s/p cardioversion, on Eliquis is seen in consultation for 2 years history of abdominal bloating and flatulence.  Her symptoms are most likely secondary to irregular bowel habits and intake of artificial sweeteners Advised patient to completely eliminate artificial sweeteners Take MiraLAX daily to have regular/satisfactory bowel movement Discussed about increased intake of fiber IBgard samples provided  Elevated LFTs Recheck LFTs today Recommend right upper quadrant ultrasound   Follow up as needed   Arlyss Repress, MD

## 2022-11-06 ENCOUNTER — Ambulatory Visit: Payer: Medicare HMO | Attending: Internal Medicine | Admitting: Internal Medicine

## 2022-11-06 ENCOUNTER — Encounter: Payer: Self-pay | Admitting: Internal Medicine

## 2022-11-06 VITALS — BP 122/70 | HR 85 | Ht 63.0 in | Wt 174.0 lb

## 2022-11-06 DIAGNOSIS — Z01812 Encounter for preprocedural laboratory examination: Secondary | ICD-10-CM

## 2022-11-06 DIAGNOSIS — I4819 Other persistent atrial fibrillation: Secondary | ICD-10-CM

## 2022-11-06 DIAGNOSIS — M159 Polyosteoarthritis, unspecified: Secondary | ICD-10-CM | POA: Diagnosis not present

## 2022-11-06 DIAGNOSIS — M797 Fibromyalgia: Secondary | ICD-10-CM | POA: Diagnosis not present

## 2022-11-06 DIAGNOSIS — I440 Atrioventricular block, first degree: Secondary | ICD-10-CM | POA: Diagnosis not present

## 2022-11-06 DIAGNOSIS — Z79899 Other long term (current) drug therapy: Secondary | ICD-10-CM | POA: Diagnosis not present

## 2022-11-06 LAB — HEPATIC FUNCTION PANEL
ALT: 8 IU/L (ref 0–32)
AST: 18 IU/L (ref 0–40)
Albumin: 4.5 g/dL (ref 3.9–4.9)
Alkaline Phosphatase: 106 IU/L (ref 44–121)
Bilirubin Total: 0.4 mg/dL (ref 0.0–1.2)
Bilirubin, Direct: 0.13 mg/dL (ref 0.00–0.40)
Total Protein: 7 g/dL (ref 6.0–8.5)

## 2022-11-06 MED ORDER — AMIODARONE HCL 200 MG PO TABS: ORAL_TABLET | ORAL | 0 refills | Status: DC

## 2022-11-06 NOTE — Progress Notes (Signed)
Patient Care Team: Jerl Mina, MD as PCP - General (Family Medicine) Iran Ouch, MD as PCP - Cardiology (Cardiology) Duke Salvia, MD as PCP - Electrophysiology (Cardiology)   HPI  Krystal [name] is a 72 y.o. female seen in follow-up for atrial fibrillation which presented in the context of urosepsis altered mental status rhabdomyolysis complicated by acute kidney injury. Started on flecainide 100 twice daily and down titration of her diltiazem at the last visit  Recurrent atrial flutter requiring cardioversion 1/24 flecainide  subsequently discontinued after reversion again to atrial fibrillation Strated on propafenone  Interval mechanical fall with head trauma   Recurrent atrial fibrillation.  Did not benefit from the propafenone.  Symptomatic lightheadedness and dyspnea and fatigue with this.  Also a great deal of ongoing issues of stress.  DATE TEST EF    8/23 Echo   65 % MR mild--Mod Normal LA size   10/23 Myoview      % No ischemia             Date Cr K Mg Hgb TSH LFTS  8/23 1.11 3.6 1.6 12.5 2.09   11/23 0.9 3.6        4/24      8   DATE PR interval QRSduration PQRS Dose  12/23  208 86 194 0           Thromboembolic risk factors ( age -81, HTN-1, Gender-1) for a CHADSVASc Score of >=3   Treated sleep apnea Records and Results Reviewed   Past Medical History:  Diagnosis Date   Anemia    Arthritis    osteo   Blindness of right eye at birth   Cancer 2011   Left breast 2011 and cervical age 45   Carpal tunnel syndrome    Cataract    Depression    Edema 07/27/2011   Fibromyalgia    muscle weakness and pain   GERD (gastroesophageal reflux disease)    Hyperlipidemia    Hypertension    benign   Lymphedema    Osteoporosis    Plantar fasciitis    Pre-diabetes    Raynaud's disease    Restless leg syndrome    Rosacea    Rotator cuff rupture    Sleep apnea    Synovitis    Ulcer     Past Surgical History:  Procedure Laterality  Date   ABDOMINAL HYSTERECTOMY  1986   For bleeding and pain   BLADDER SURGERY  2006   Bladder tack   BREAST RECONSTRUCTION Bilateral 09/17/2012   Procedure: BREAST RECONSTRUCTION;  Surgeon: Wayland Denis, DO;  Location: [facility];  Service: Plastics;  Laterality: Bilateral;  BILATERAL BREAST RECONSTRUCTION WITH TISSUE EXPANDERS AND ALLOMED   BREAST RECONSTRUCTION Right 06/03/2013   Procedure: RIGHT BREAST CAPSULE CONSTRACTURE;  Surgeon: Wayland Denis, DO;  Location: [facility];  Service: Plastics;  Laterality: Right;   CARDIOVERSION N/A 08/06/2022   Procedure: CARDIOVERSION;  Surgeon: Iran Ouch, MD;  Location: [facility];  Service: Cardiovascular;  Laterality: N/A;   CARDIOVERSION N/A 10/02/2022   Procedure: CARDIOVERSION;  Surgeon: Yvonne Kendall, MD;  Location: [facility];  Service: Cardiovascular;  Laterality: N/A;   CARPAL TUNNEL RELEASE Bilateral 2008   EYE SURGERY Right    INJECTION KNEE Left 08/12/2018   Procedure: KNEE INJECTION-LEFT;  Surgeon: Christena Flake, MD;  Location: [facility];  Service: Orthopedics;  Laterality: Left;   JOINT REPLACEMENT  LIPOSUCTION Bilateral 01/29/2013   Procedure: LIPOSUCTION;  Surgeon: Wayland Denis, DO;  Location: [facility];  Service: Plastics;  Laterality: Bilateral;   LIPOSUCTION Right 06/03/2013   Procedure: LIPOSUCTION;  Surgeon: Wayland Denis, DO;  Location: [facility];  Service: Plastics;  Laterality: Right;   MASTECTOMY  2012   rt prophalactic mast-snbx   MASTECTOMY MODIFIED RADICAL  2012   left-axillary nodes   NEUROMA SURGERY Bilateral    NOSE SURGERY  2007   PORT-A-CATH REMOVAL     insertion and   THROAT SURGERY     TONSILLECTOMY     TOTAL KNEE ARTHROPLASTY Right 08/12/2018   Procedure: TOTAL KNEE ARTHROPLASTY-RIGHT;  Surgeon: Christena Flake, MD;  Location: [facility];  Service: Orthopedics;  Laterality: Right;   TOTAL KNEE ARTHROPLASTY Left 09/22/2019   Procedure: TOTAL KNEE  ARTHROPLASTY;  Surgeon: Christena Flake, MD;  Location: [facility];  Service: Orthopedics;  Laterality: Left;   UVULOPALATOPHARYNGOPLASTY      Current Meds  Medication Sig   albuterol (VENTOLIN HFA) 108 (90 Base) MCG/ACT inhaler Inhale 2 puffs into the lungs every 6 (six) hours as needed for wheezing or shortness of breath.   alendronate (FOSAMAX) 70 MG tablet Take 70 mg by mouth once a week. Take with a full glass of water on an empty stomach.   apixaban (ELIQUIS) 5 MG TABS tablet Take 1 tablet (5 mg total) by mouth 2 (two) times daily.   cetirizine (ZYRTEC) 10 MG tablet Take 10 mg by mouth daily.   cholecalciferol (VITAMIN D3) 25 MCG (1000 UNIT) tablet Take 1,000 Units by mouth daily.   diltiazem (DILACOR XR) 120 MG 24 hr capsule Take 120 mg by mouth daily.   DULoxetine (CYMBALTA) 60 MG capsule Take 60 mg by mouth daily.   fluticasone (FLONASE) 50 MCG/ACT nasal spray Place 2 sprays into both nostrils daily.   furosemide (LASIX) 20 MG tablet Take 40 mg by mouth 2 (two) times daily.   Glucosamine-Chondroitin (COSAMIN DS PO) Take 1 tablet by mouth 3 (three) times a week.   levocetirizine (XYZAL) 5 MG tablet Take 5 mg by mouth every evening.   LYSINE ACETATE PO Take 4,000 mg by mouth as needed.   magnesium oxide (MAG-OX) 400 (240 Mg) MG tablet Take 400 mg by mouth as needed.   meloxicam (MOBIC) 7.5 MG tablet Take 7.5 mg by mouth daily.   omeprazole (PRILOSEC) 20 MG capsule Take 20 mg by mouth daily.   potassium chloride (KLOR-CON) 10 MEQ tablet Take 10 mEq by mouth 2 (two) times daily.   pregabalin (LYRICA) 300 MG capsule Take 300 mg by mouth 2 (two) times daily.   propafenone (RYTHMOL SR) 325 MG 12 hr capsule Take 325 mg by mouth 2 (two) times daily.   rOPINIRole (REQUIP) 1 MG tablet Take 1 mg by mouth at bedtime.   triamcinolone (KENALOG) 0.025 % cream Apply 1 Application topically as needed.   Vibegron (GEMTESA) 75 MG TABS Take 75 mg by mouth daily.   Vitamin D, Ergocalciferol, (DRISDOL) 1.25  MG (50000 UNIT) CAPS capsule Take 50,000 Units by mouth every 7 (seven) days.   zolpidem (AMBIEN) 10 MG tablet Take 10 mg by mouth at bedtime as needed for sleep.    Allergies  Allergen Reactions   Cephalexin Nausea Only    Diaphoresis / Sweating (intolerance)   Levaquin [Levofloxacin] Other (See Comments)    Stiff joints, unable to walk.      Review of Systems negative except  from HPI and PMH  Physical Exam BP 122/70 (BP Location: Left Arm, Patient Position: Sitting, Cuff Size: Normal)   Pulse 85   Ht 5\' 3"  (1.6 m)   Wt 174 lb (78.9 kg)   SpO2 97%   BMI 30.82 kg/m  Well developed and nourished in no acute distress HENT normal Neck supple with JVP-  flat   irregular rate and rhythm, no murmurs or gallops Abd-soft with active BS No Clubbing cyanosis edema Skin-warm and dry A & Oriented  Grossly normal sensory and motor function  ECG atrial fib/flutter at 85 Intervals-/10/39  CrCl cannot be calculated (Patient's most recent lab result is older than the maximum 21 days allowed.).   Assessment and  Plan  Atrial flutter/fibrillation-persistent   First-degree AV block   History of excessive alcohol intake   Memory issues  Lymphedema   Bilateral hand dexterity weakness  Recurrent persistent atrial fibrillation/flutter.  Will discontinue the propafenone.  Discussed 3 intermediate strategies on the way to referral for catheter ablation which in this young lady I think would be the primary approach.  The first was to be stop the propafenone and do nothing, the second would be to stop the propafenone and use dofetilide and third would to use amiodarone.  We discussed inpatient versus outpatient initiation side effect profiles etc. and understanding that this is a bridge to a more definitive procedure, but elected to start her on amiodarone.  Will start her on 400 twice daily then 400 daily.  Surveillance laboratories at baseline are normal.  Continue  anticoagulation.  Current medicines are reviewed at length with the patient today .  The patient does not  have concerns regarding medicines.

## 2022-11-06 NOTE — Patient Instructions (Signed)
Medication Instructions:  Your physician has recommended you make the following change in your medication:   1) STOP Propafenone today  2) START Amiodarone 200 mg tablets tomorrow (11/07/22) - take 2 tablets (400 mg) by mouth TWICE daily (or every 12 hours) x 2 weeks - take 2 tablets (400 mg) by mouth ONCE daily x 2 weeks - take 1 tablet (200 mg) by mouth ONCE daily    *If you need a refill on your cardiac medications before your next appointment, please call your pharmacy*   Lab Work: - Your physician recommends that you return for lab work the week of 11/26/22  BMP/ CBC  Medical Mall Entrance at [facility] 1st desk on the right to check in (REGISTRATION)  Lab hours: Monday- Friday (7:30 am- 5:30 pm)   If you have labs (blood work) drawn today and your tests are completely normal, you will receive your results only by: MyChart Message (if you have MyChart) OR A paper copy in the mail If you have any lab test that is abnormal or we need to change your treatment, we will call you to review the results.   Testing/Procedures: 1) You have been referred to : Dr. Steffanie Dunn for consideration of atrial fibrillation ablation   2) Cardioversion: - Your physician has recommended that you have a Cardioversion (DCCV). Electrical Cardioversion uses a jolt of electricity to your heart either through paddles or wired patches attached to your chest. This is a controlled, usually prescheduled, procedure. Defibrillation is done under light anesthesia in the hospital, and you usually go home the day of the procedure. This is done to get your heart back into a normal rhythm. You are not awake for the procedure. Please see the instruction sheet given to you today.  You are scheduled for a Cardioversion on Monday 12/03/22 with Dr. Kirke Corin.    Please arrive at the [facility] Entrance of [facility], [address], [place] [address] at 6:30 am (This is 1 hour prior to your procedure time).   Proceed to the Check-In Desk directly inside the entrance.  Procedure Parking: Use the entrance off of the [address] side of the hospital. Turn right upon entering and follow the driveway to parking that is directly in front of the Heart & Vascular Center. There is no valet parking available at this entrance, however there is an awning directly in front of the Heart & Vascular Center for drop off/ pick up for patients  DIET: Nothing to eat or drink after midnight except a sip of water with medications (see medication instructions below)  Medication Instructions: Hold Lasix (furosemide) the morning of your procedure Continue your anticoagulant: Eliquis If you miss a dose, please call us at [phone] You will need to continue your anticoagulant after your procedure until you are told by your provider that it is safe to stop.   Labs: If patient is on Coumadin, patient needs pt/INR, CBC, BMET within 3 days (No pt/INR needed for patients taking Xarelto, Eliquis, Pradaxa) For patients receiving anesthesia for TEE and all Cardioversion patients: BMET, CBC within 1 week  FYI: For your safety, and to allow [place] to monitor your vital signs accurately during the surgery/procedure we request that if you have artificial nails, gel coating, SNS etc. Please have those removed prior to your surgery/procedure. Not having the nail coverings /polish removed may result in cancellation or delay of your surgery/procedure.  You must have a responsible person to drive you home and  stay in the waiting area during your procedure. Failure to do so could result in cancellation.  Bring your insurance cards.  If you have any questions after you get home, please call the office at [phone]  *Special Note: Every effort is made to have your procedure done on time. Occasionally there are emergencies that occur at the hospital that may cause delays. Please be patient if a delay does occur.        Follow-Up: At  [facility], you and your health needs are our priority.  As part of our continuing mission to provide you with exceptional heart care, we have created designated Provider Care Teams.  These Care Teams include your primary Cardiologist (physician) and Advanced Practice Providers (APPs -  Physician Assistants and Nurse Practitioners) who all work together to provide you with the care you need, when you need it.  We recommend signing up for the patient portal called "MyChart".  Sign up information is provided on this After Visit Summary.  MyChart is used to connect with patients for Virtual Visits (Telemedicine).  Patients are able to view lab/test results, encounter notes, upcoming appointments, etc.  Non-urgent messages can be sent to your provider as well.   To learn more about what you can do with MyChart, go to [email or web address].    Your next appointment:   Pending your appointment/ evaluation with Dr. Lalla Brothers  Provider:   Sherryl Manges, MD     Other Instructions  Electrical Cardioversion Electrical cardioversion is the delivery of a jolt of electricity to restore a normal rhythm to the heart. A rhythm that is too fast or is not regular (arrhythmia) keeps the heart from pumping blood well. There is also another type of cardioversion called a chemical (pharmacologic) cardioversion. This is when your health care provider gives you one or more medicines to bring back your regular heart rhythm. Electrical cardioversion is done as a scheduled procedure for arrhythmiasthat are not life-threatening. Electrical cardioversion may also be done in an emergency for sudden life-threatening arrhythmias. Tell a health care provider about: Any allergies you have. All medicines you are taking, including vitamins, herbs, eye drops, creams, and over-the-counter medicines. Any problems you or family members have had with sedatives or anesthesia. Any bleeding problems you have. Any surgeries you  have had, including a pacemaker, defibrillator, or other implanted device. Any medical conditions you have. Whether you are pregnant or may be pregnant. What are the risks? Your provider will talk with you about risks. These include: Allergic reactions to medicines. Irritation to the skin on your chest or back where the sticky pads (electrodes) or paddles were put during electrical cardioversion. A blood clot that breaks free and travels to other parts of your body, such as your brain. Return of a worse abnormal heart rhythm that will need to be treated with medicines, a pacemaker, or an implantable cardioverter defibrillator (ICD). What happens before the procedure? Medicines Your provider may give you: Blood-thinning medicines (anticoagulants) so your blood does not clot as easily. If your provider gives you this medicine, you may need to take it for 4 weeks before the procedure. Medicines to help stabilize your heart rate and rhythm. Ask your provider about: Changing or stopping your regular medicines. These include any diabetes medicines or blood thinners you take. Taking medicines such as aspirin and ibuprofen. These medicines can thin your blood. Do not take them unless your provider tells you to. Taking over-the-counter medicines, vitamins, herbs, and supplements. General instructions  Follow instructions from your provider about what you may eat and drink. Do not put any lotions, powders, or ointments on your chest and back for 24 hours before the procedure. They can cause problems with the electrodes or paddles used to deliver electricity to your heart. Do not wear jewelry as this can interfere with delivering electricity to your heart. If you will be going home right after the procedure, plan to have a responsible adult: Take you home from the hospital or clinic. You will not be allowed to drive. Care for you for the time you are told. Tests You may have an exam or testing. This  may include: Blood labs. A transesophageal echocardiogram (TEE). What happens during the procedure?     An IV will be inserted into one of your veins. You will be given a sedative. This helps you relax. Electrodes or metal paddles will be placed on your chest. They may be placed in one of these ways: One placed on your right chest, the other on the left ribs. One placed on your chest and the other on your back. An electrical shock will be delivered. The shock briefly stops (resets) your heart rhythm. Your provider will check to see if your heart rhythm is now normal. Some people need only one shock. Some need more to restore a normal heart rhythm. The procedure may vary among providers and hospitals. What happens after the procedure? Your blood pressure, heart rate, breathing rate, and blood oxygen level will be monitored until you leave the hospital or clinic. Your heart rhythm will be watched to make sure it does not change. This information is not intended to replace advice given to you by your health care provider. Make sure you discuss any questions you have with your health care provider. Document Revised: 03/01/2022 Document Reviewed: 03/01/2022 Elsevier Patient Education  2023 Elsevier Inc.   Amiodarone Tablets What is this medication? AMIODARONE (a MEE oh da rone) prevents and treats a fast or irregular heartbeat (arrhythmia). It works by slowing down overactive electric signals in the heart, which stabilizes your heart rhythm. It belongs to a group of medications called antiarrhythmics. This medicine may be used for other purposes; ask your health care provider or pharmacist if you have questions. COMMON BRAND NAME(S): Cordarone, Pacerone What should I tell my care team before I take this medication? They need to know if you have any of these conditions: Liver disease Lung disease Other heart problems Thyroid disease An unusual or allergic reaction to amiodarone, iodine,  other medications, foods, dyes, or preservatives Pregnant or trying to get pregnant Breast-feeding How should I use this medication? Take this medication by mouth with water. Take it as directed on the prescription label at the same time every day. You can take it with or without food. You should always take it the same way. Keep taking it unless your care team tells you to stop. A special MedGuide will be given to you by the pharmacist with each prescription and refill. Be sure to read this information carefully each time. Talk to your care team about the use of this medication in children. Special care may be needed. Overdosage: If you think you have taken too much of this medicine contact a poison control center or emergency room at once. NOTE: This medicine is only for you. Do not share this medicine with others. What if I miss a dose? If you miss a dose, take it as soon as you can. If it  is almost time for your next dose, take only that dose. Do not take double or extra doses. What may interact with this medication? Do not take this medication with any of the following: Abarelix Apomorphine Arsenic trioxide Certain antibiotics, such as erythromycin, gemifloxacin, levofloxacin, or pentamidine Certain medications for depression, such as amoxapine or tricyclic antidepressants Certain medications for fungal infections, such as fluconazole, itraconazole, ketoconazole, posaconazole, or voriconazole Certain medications for irregular heartbeat, such as disopyramide, dronedarone, ibutilide, propafenone, or sotalol Certain medications for malaria, such as chloroquine or halofantrine Cisapride Droperidol Haloperidol [place] Maprotiline Methadone Phenothiazines, such as chlorpromazine, mesoridazine, or thioridazine Pimozide Ranolazine Red yeast rice Vardenafil This medication may also interact with the following: Antivirals for HIV Certain medications for blood pressure, heart disease,  irregular heartbeat Certain medications for cholesterol, such as atorvastatin, cerivastatin, lovastatin, or simvastatin Certain medications for hepatitis C, such as sofosbuvir and ledipasvir; sofosbuvir Certain medications for seizures, such as phenytoin Certain medications for thyroid problems Certain medications that prevent or treat blood clots, such as warfarin Cholestyramine Cimetidine Clopidogrel Cyclosporine Dextromethorphan Diuretics Dofetilide Fentanyl General anesthetics Grapefruit juice Lidocaine Loratadine Methotrexate Other medications that cause heart rhythm changes Procainamide Quinidine Rifabutin, rifampin, or rifapentine St. John's Wort Trazodone Ziprasidone This list may not describe all possible interactions. Give your health care provider a list of all the medicines, herbs, non-prescription drugs, or dietary supplements you use. Also tell them if you smoke, drink alcohol, or use illegal drugs. Some items may interact with your medicine. What should I watch for while using this medication? Your condition will be monitored closely when you first begin therapy. This medication is often started in a hospital or other monitored health care setting. Once you are on maintenance therapy, visit your care team for regular checks on your progress. Because your condition and use of this medication carry some risk, it is a good idea to carry an identification card, necklace, or bracelet with details of your condition, medications, and care team. This medication may affect your coordination, reaction time, or judgment. Do not drive or operate machinery until you know how this medication affects you. Sit up or stand slowly to reduce the risk of dizzy or fainting spells. Drinking alcohol with this medication can increase the risk of these side effects. This medication can make you more sensitive to the sun. Keep out of the sun. If you cannot avoid being in the sun, wear protective  clothing and sunscreen. Do not use sun lamps, tanning beds, or tanning booths. You should have regular eye exams before and during treatment. Call your care team if you have blurred vision, see halos, or your eyes become sensitive to light. Your eyes may get dry. It may be helpful to use a lubricating eye solution or artificial tears solution. If you are going to have surgery or a procedure that requires contrast dyes, tell your care team that you are taking this medication. What side effects may I notice from receiving this medication? Side effects that you should report to your care team as soon as possible: Allergic reactions--skin rash, itching, hives, swelling of the face, lips, tongue, or throat Bluish-gray skin Change in vision such as blurry vision, seeing halos around lights, vision loss Heart failure--shortness of breath, swelling of the ankles, feet, or hands, sudden weight gain, unusual weakness or fatigue Heart rhythm changes--fast or irregular heartbeat, dizziness, feeling faint or lightheaded, chest pain, trouble breathing High thyroid levels (hyperthyroidism)--fast or irregular heartbeat, weight loss, excessive sweating or sensitivity to heat,  tremors or shaking, anxiety, nervousness, irregular menstrual cycle or spotting Liver injury--right upper belly pain, loss of appetite, nausea, light-colored stool, dark yellow or brown urine, yellowing skin or eyes, unusual weakness or fatigue Low thyroid levels (hypothyroidism)--unusual weakness or fatigue, sensitivity to cold, constipation, hair loss, dry skin, weight gain, feelings of depression Lung injury--shortness of breath or trouble breathing, cough, spitting up blood, chest pain, fever Pain, tingling, or numbness in the hands or feet, muscle weakness, trouble walking, loss of balance or coordination Side effects that usually do not require medical attention (report to your care team if they continue or are  bothersome): Nausea Vomiting This list may not describe all possible side effects. Call your doctor for medical advice about side effects. You may report side effects to FDA at 1-800-FDA-1088. Where should I keep my medication? Keep out of the reach of children and pets. Store at room temperature between 20 and 25 degrees C (68 and 77 degrees F). Protect from light. Keep container tightly closed. Throw away any unused medication after the expiration date. NOTE: This sheet is a summary. It may not cover all possible information. If you have questions about this medicine, talk to your doctor, pharmacist, or health care provider.  2023 Elsevier/Gold Standard (2020-09-02 00:00:00)

## 2022-11-07 ENCOUNTER — Other Ambulatory Visit: Payer: Self-pay | Admitting: Internal Medicine

## 2022-11-07 DIAGNOSIS — I4819 Other persistent atrial fibrillation: Secondary | ICD-10-CM

## 2022-11-15 ENCOUNTER — Ambulatory Visit
Admission: RE | Admit: 2022-11-15 | Discharge: 2022-11-15 | Disposition: A | Discharge status: Home / Self Care | Payer: Medicare HMO | Source: Ambulatory Visit | Attending: Gastroenterology | Admitting: Gastroenterology

## 2022-11-15 DIAGNOSIS — R7989 Other specified abnormal findings of blood chemistry: Secondary | ICD-10-CM | POA: Diagnosis not present

## 2022-11-15 DIAGNOSIS — R945 Abnormal results of liver function studies: Secondary | ICD-10-CM | POA: Diagnosis not present

## 2022-11-15 DIAGNOSIS — K824 Cholesterolosis of gallbladder: Secondary | ICD-10-CM | POA: Diagnosis not present

## 2022-11-27 ENCOUNTER — Other Ambulatory Visit: Payer: Self-pay | Admitting: *Deleted

## 2022-11-27 MED ORDER — AMIODARONE HCL 200 MG PO TABS: ORAL_TABLET | ORAL | 1 refills | Status: DC

## 2022-11-29 ENCOUNTER — Other Ambulatory Visit
Admission: RE | Admit: 2022-11-29 | Discharge: 2022-11-29 | Disposition: A | Discharge status: Home / Self Care | Payer: Medicare HMO | Attending: Internal Medicine | Admitting: Internal Medicine

## 2022-11-29 DIAGNOSIS — I4819 Other persistent atrial fibrillation: Secondary | ICD-10-CM | POA: Insufficient documentation

## 2022-11-29 DIAGNOSIS — Z01812 Encounter for preprocedural laboratory examination: Secondary | ICD-10-CM | POA: Insufficient documentation

## 2022-11-29 LAB — BASIC METABOLIC PANEL
Anion gap: 5 (ref 5–15)
BUN: 10 mg/dL (ref 8–23)
CO2: 30 mmol/L (ref 22–32)
Calcium: 8.9 mg/dL (ref 8.9–10.3)
Chloride: 105 mmol/L (ref 98–111)
Creatinine, Ser: 1.18 mg/dL — ABNORMAL HIGH (ref 0.44–1.00)
GFR, Estimated: 50 mL/min — ABNORMAL LOW (ref >60)
Glucose, Bld: 103 mg/dL — ABNORMAL HIGH (ref 70–99)
Potassium: 3.6 mmol/L (ref 3.5–5.1)
Sodium: 140 mmol/L (ref 135–145)

## 2022-11-29 LAB — CBC
HCT: 40.4 % (ref 36.0–46.0)
Hemoglobin: 13.2 g/dL (ref 12.0–15.0)
MCH: 28 pg (ref 26.0–34.0)
MCHC: 32.7 g/dL (ref 30.0–36.0)
MCV: 85.8 fL (ref 80.0–100.0)
Platelets: 243 10*3/uL (ref 150–400)
RBC: 4.71 MIL/uL (ref 3.87–5.11)
RDW: 15.5 % (ref 11.5–15.5)
WBC: 5.7 10*3/uL (ref 4.0–10.5)
nRBC: 0 % (ref 0.0–0.2)

## 2022-11-30 ENCOUNTER — Telehealth: Payer: Self-pay | Admitting: Cardiovascular Disease

## 2022-11-30 ENCOUNTER — Encounter: Payer: Self-pay | Admitting: Nurse Practitioner

## 2022-11-30 ENCOUNTER — Ambulatory Visit: Payer: Medicare HMO | Attending: Nurse Practitioner | Admitting: Nurse Practitioner

## 2022-11-30 VITALS — BP 130/78 | HR 71 | Ht 63.0 in | Wt 174.4 lb

## 2022-11-30 DIAGNOSIS — I4819 Other persistent atrial fibrillation: Secondary | ICD-10-CM | POA: Diagnosis not present

## 2022-11-30 NOTE — Telephone Encounter (Signed)
Patient c/o Palpitations:  High priority if patient c/o lightheadedness, shortness of breath, or chest pain  How long have you had palpitations/irregular HR/ Afib? Are you having the symptoms now? yes  Are you currently experiencing lightheadedness, SOB or CP? no  Do you have a history of afib (atrial fibrillation) or irregular heart rhythm? no  Have you checked your BP or HR? (document readings if available): HR 84  Are you experiencing any other symptoms?      Patient thinks she is out of regular rhythm and would like to get check before her procedure on Monday. Please advise

## 2022-11-30 NOTE — Patient Instructions (Signed)
Medication Instructions:  No changes *If you need a refill on your cardiac medications before your next appointment, please call your pharmacy*   Lab Work: None ordered If you have labs (blood work) drawn today and your tests are completely normal, you will receive your results only by: MyChart Message (if you have MyChart) OR A paper copy in the mail If you have any lab test that is abnormal or we need to change your treatment, we will call you to review the results.   Testing/Procedures: None ordered   Follow-Up: At [facility], you and your health needs are our priority.  As part of our continuing mission to provide you with exceptional heart care, we have created designated Provider Care Teams.  These Care Teams include your primary Cardiologist (physician) and Advanced Practice Providers (APPs -  Physician Assistants and Nurse Practitioners) who all work together to provide you with the care you need, when you need it.  We recommend signing up for the patient portal called "MyChart".  Sign up information is provided on this After Visit Summary.  MyChart is used to connect with patients for Virtual Visits (Telemedicine).  Patients are able to view lab/test results, encounter notes, upcoming appointments, etc.  Non-urgent messages can be sent to your provider as well.   To learn more about what you can do with MyChart, go to [email or web address].    Your next appointment:   Keep follow up with Dr. Lalla Brothers as planned

## 2022-11-30 NOTE — Telephone Encounter (Signed)
Follow Up:     This patient is returning [name]'s call from  today.

## 2022-11-30 NOTE — Progress Notes (Signed)
    Pt presented today for ECG eval in the setting of persistent atrial fibrillation with plan for cardioversion next week.  Patient felt that she was back in sinus rhythm.  This is confirmed by ECG.   Vitals:   11/30/22 1435  BP: 130/78  Pulse: 71  SpO2: 98%    ECG: Regular sinus rhythm, 71, first-degree AV block, nonspecific T changes.  QTc 488.  Lab Results  Component Value Date   CREATININE 1.18 (H) 11/29/2022   BUN 10 11/29/2022   NA 140 11/29/2022   K 3.6 11/29/2022   CL 105 11/29/2022   CO2 30 11/29/2022    Lab Results  Component Value Date   WBC 5.7 11/29/2022   HGB 13.2 11/29/2022   HCT 40.4 11/29/2022   MCV 85.8 11/29/2022   PLT 243 11/29/2022    A/P:  1. Persistent Afib:  Prev failed flecainide, tikosyn, and propafenone.  Now on amio 200 daily.  Noted normal rhythm @ home and presented for ECG to avoid having to pay for cancelled cardioversion if she were to show up on Monday (has had this happen twice and had to pay $385 both times).  She is in sinus rhythm today.  QTc 488 by my calculation, which is similar to QTc noted on some prior ECGs.  We will cancel planned DCCV.  Cont eliquis.  Keep appt w/ Dr. Lalla Brothers in early June.  Nicolasa Ducking, NP 11/30/2022, 2:49 PM

## 2022-11-30 NOTE — Telephone Encounter (Signed)
Left a message for the patient to call back.  

## 2022-11-30 NOTE — Telephone Encounter (Signed)
Called and spoke to the patient. She is scheduled for a cardioversion Monday morning but feels like she is in normal rhythm. Patient added to the schedule today for an EKG and visit.

## 2022-12-03 ENCOUNTER — Encounter: Admission: RE | Payer: Self-pay | Source: Home / Self Care

## 2022-12-03 ENCOUNTER — Ambulatory Visit: Admission: RE | Admit: 2022-12-03 | Payer: Medicare HMO | Source: Home / Self Care | Admitting: Cardiovascular Disease

## 2022-12-03 SURGERY — CARDIOVERSION
Anesthesia: General

## 2022-12-14 ENCOUNTER — Ambulatory Visit: Payer: Medicare HMO | Admitting: Physician Assistant

## 2022-12-25 NOTE — Progress Notes (Unsigned)
Electrophysiology Office Follow up Visit Note:    Date:  12/25/2022   ID:  Krystal [name], DOB [birth year], MRN [id number]  PCP:  Jerl Mina, MD  [facility] Cardiologist:  Lorine Bears, MD  [facility] Electrophysiologist:  Sherryl Manges, MD    Interval History:    Krystal [name] is a 72 y.o. female who presents for a follow up visit.   She is followed by Dr. Graciela Husbands in the outpatient setting.  She last saw Dr. Graciela Husbands November 06, 2022.  I am seeing her today for an evaluation of her atrial fibrillation.  She was previously on flecainide for atrial fibrillation and this was transitioned to propafenone.  She did not tolerate the propafenone and it was also ineffective.  She also carries a diagnosis of hypertension, sleep apnea.  At the appointment with Dr. Graciela Husbands her propafenone was stopped and she was started on amiodarone as a bridge to catheter ablation.  She was seen by Christain Sacramento on Nov 30, 2022.  At that appointment she was back in sinus rhythm.       Past medical, surgical, social and family history were reviewed.  ROS:   Please see the history of present illness.    All other systems reviewed and are negative.  EKGs/Labs/Other Studies Reviewed:    The following studies were reviewed today:  February 28, 2022 echo EF 65-70 RV normal Mild to moderate MR Mild to moderate TR  10/02/2022 ECG shows AFL   EKG:  The ekg ordered today demonstrates sinus rhythm   Physical Exam:    VS:  There were no vitals taken for this visit.    Wt Readings from Last 3 Encounters:  11/30/22 174 lb 6 oz (79.1 kg)  11/06/22 174 lb (78.9 kg)  11/05/22 174 lb 4 oz (79 kg)     GEN:  Well nourished, well developed in no acute distress CARDIAC: RRR, no murmurs, rubs, gallops RESPIRATORY:  Clear to auscultation without rales, wheezing or rhonchi       ASSESSMENT:    1. Persistent atrial fibrillation (HCC)   2. Atrial flutter, unspecified type (HCC)   3. Encounter for long-term  (current) use of high-risk medication    PLAN:    In order of problems listed above:   #Persistent atrial fibrillation #High risk med monitoring-amiodarone Symptomatic.  Failed flecainide, propafenone and is now on amiodarone as a bridge to catheter ablation.  I discussed treatment options for her atrial fibrillation in detail during today's encounter including the risks of each option.  Discussed treatment options today for AF including antiarrhythmic drug therapy and ablation. Discussed risks, recovery and likelihood of success with each treatment strategy. Risk, benefits, and alternatives to EP study and radiofrequency ablation for afib were discussed. These risks include but are not limited to stroke, bleeding, vascular damage, tamponade, perforation, damage to the esophagus, lungs, phrenic nerve and other structures, pulmonary vein stenosis, worsening renal function, and death.  Discussed potential need for repeat ablation procedures and antiarrhythmic drugs after an initial ablation. The patient understands these risk and wishes to proceed.  We will therefore proceed with catheter ablation at the next available time.  Carto, ICE, anesthesia are requested for the procedure.  Will also obtain CT PV protocol prior to the procedure to exclude LAA thrombus and further evaluate atrial anatomy.  Ablation strategy would be PVI plus CTI ablation.       Signed, Steffanie Dunn, MD, [facility], [facility] 12/25/2022 6:21 PM    Electrophysiology  [facility]

## 2022-12-26 ENCOUNTER — Ambulatory Visit: Payer: Medicare HMO | Attending: Cardiology | Admitting: Cardiology

## 2022-12-26 VITALS — BP 124/60 | HR 70 | Ht 63.0 in | Wt 174.0 lb

## 2022-12-26 DIAGNOSIS — Z79899 Other long term (current) drug therapy: Secondary | ICD-10-CM | POA: Diagnosis not present

## 2022-12-26 DIAGNOSIS — I4892 Unspecified atrial flutter: Secondary | ICD-10-CM

## 2022-12-26 DIAGNOSIS — I4819 Other persistent atrial fibrillation: Secondary | ICD-10-CM

## 2022-12-26 NOTE — Patient Instructions (Addendum)
Medication Instructions:  Your physician recommends that you continue on your current medications as directed. Please refer to the Current Medication list given to you today.  *If you need a refill on your cardiac medications before your next appointment, please call your pharmacy*  Lab Work: BMET and CBC prior to your CT and ablation  You will get your lab work at [facility]) hospital.  Your lab work will be done at the [facility] next to [employer].  These are walk in labs- you will not need an appointment and you do not need to be fasting.    Testing/Procedures: Your physician has requested that you have cardiac CT. Cardiac computed tomography (CT) is a painless test that uses an x-ray machine to take clear, detailed pictures of your heart. For further information please visit [email or web address] We will call you to schedule you CT scan. This will be done about one week prior to your ablation.  Your physician has recommended that you have an ablation. Catheter ablation is a medical procedure used to treat some cardiac arrhythmias (irregular heartbeats). During catheter ablation, a long, thin, flexible tube is put into a blood vessel in your groin (upper thigh), or neck. This tube is called an ablation catheter. It is then guided to your heart through the blood vessel. Radio frequency waves destroy small areas of heart tissue where abnormal heartbeats may cause an arrhythmia to start. Please see the instruction sheet given to you today. You are scheduled for Atrial Fibrillation Ablation on Thursday, September 19 with Dr. Steffanie Dunn.Please arrive at the Main Entrance A at [facility]: [address] [place], [place] [address] at 8:30 AM   Follow-Up: At [facility], you and your health needs are our priority.  As part of our continuing mission to provide you with exceptional heart care, we have created designated Provider Care Teams.  These Care Teams  include your primary Cardiologist (physician) and Advanced Practice Providers (APPs -  Physician Assistants and Nurse Practitioners) who all work together to provide you with the care you need, when you need it.  Your next appointment:   We will call you to arrange your follow up appointments.

## 2023-01-18 DIAGNOSIS — H52222 Regular astigmatism, left eye: Secondary | ICD-10-CM | POA: Diagnosis not present

## 2023-01-18 DIAGNOSIS — H2512 Age-related nuclear cataract, left eye: Secondary | ICD-10-CM | POA: Diagnosis not present

## 2023-01-18 DIAGNOSIS — H5316 Psychophysical visual disturbances: Secondary | ICD-10-CM | POA: Diagnosis not present

## 2023-01-18 DIAGNOSIS — H53001 Unspecified amblyopia, right eye: Secondary | ICD-10-CM | POA: Diagnosis not present

## 2023-01-18 DIAGNOSIS — Z961 Presence of intraocular lens: Secondary | ICD-10-CM | POA: Diagnosis not present

## 2023-02-06 DIAGNOSIS — M159 Polyosteoarthritis, unspecified: Secondary | ICD-10-CM | POA: Diagnosis not present

## 2023-02-06 DIAGNOSIS — Z79899 Other long term (current) drug therapy: Secondary | ICD-10-CM | POA: Diagnosis not present

## 2023-02-11 ENCOUNTER — Encounter: Payer: Self-pay | Admitting: Occupational Therapy

## 2023-02-11 ENCOUNTER — Ambulatory Visit: Payer: Medicare HMO | Attending: Rheumatology | Admitting: Occupational Therapy

## 2023-02-11 DIAGNOSIS — R278 Other lack of coordination: Secondary | ICD-10-CM | POA: Diagnosis not present

## 2023-02-11 DIAGNOSIS — M25542 Pain in joints of left hand: Secondary | ICD-10-CM | POA: Insufficient documentation

## 2023-02-11 DIAGNOSIS — M25541 Pain in joints of right hand: Secondary | ICD-10-CM | POA: Diagnosis not present

## 2023-02-11 NOTE — Therapy (Signed)
[facility] [address] [place], [place], [address] Phone: [phone]   Fax:  [phone]  Occupational Therapy Evaluation  Patient Details  Name: Krystal [name] MRN: [id number] Date of Birth: 1950-08-10 Referring Provider (OT): DR Allena Katz   Encounter Date: 02/11/2023   OT End of Session - 02/11/23 1533     Visit Number 1    Number of Visits 6    Date for OT Re-Evaluation 03/25/23    OT Start Time 0955    OT Stop Time 1111    OT Time Calculation (min) 76 min    Activity Tolerance Patient tolerated treatment well    Behavior During Therapy [facility] for tasks assessed/performed             Past Medical History:  Diagnosis Date   Anemia    Arthritis    osteo   Blindness of right eye at birth   Cancer [facility]) 2011   Left breast 2011 and cervical age 30   Carpal tunnel syndrome    Cataract    Depression    Edema 07/27/2011   Fibromyalgia    muscle weakness and pain   GERD (gastroesophageal reflux disease)    Hyperlipidemia    Hypertension    benign   Lymphedema    Osteoporosis    Plantar fasciitis    Pre-diabetes    Raynaud's disease    Restless leg syndrome    Rosacea    Rotator cuff rupture    Sleep apnea    Synovitis    Ulcer     Past Surgical History:  Procedure Laterality Date   ABDOMINAL HYSTERECTOMY  1986   For bleeding and pain   BLADDER SURGERY  2006   Bladder tack   BREAST RECONSTRUCTION Bilateral 09/17/2012   Procedure: BREAST RECONSTRUCTION;  Surgeon: Wayland Denis, DO;  Location: [facility];  Service: Plastics;  Laterality: Bilateral;  BILATERAL BREAST RECONSTRUCTION WITH TISSUE EXPANDERS AND ALLOMED   BREAST RECONSTRUCTION Right 06/03/2013   Procedure: RIGHT BREAST CAPSULE CONSTRACTURE;  Surgeon: Wayland Denis, DO;  Location: [facility];  Service: Plastics;  Laterality: Right;   CARDIOVERSION N/A 08/06/2022   Procedure: CARDIOVERSION;  Surgeon: Iran Ouch,  MD;  Location: [facility];  Service: Cardiovascular;  Laterality: N/A;   CARDIOVERSION N/A 10/02/2022   Procedure: CARDIOVERSION;  Surgeon: Yvonne Kendall, MD;  Location: [facility];  Service: Cardiovascular;  Laterality: N/A;   CARPAL TUNNEL RELEASE Bilateral 2008   EYE SURGERY Right    INJECTION KNEE Left 08/12/2018   Procedure: KNEE INJECTION-LEFT;  Surgeon: Christena Flake, MD;  Location: [facility];  Service: Orthopedics;  Laterality: Left;   JOINT REPLACEMENT     LIPOSUCTION Bilateral 01/29/2013   Procedure: LIPOSUCTION;  Surgeon: Wayland Denis, DO;  Location: [facility];  Service: Plastics;  Laterality: Bilateral;   LIPOSUCTION Right 06/03/2013   Procedure: LIPOSUCTION;  Surgeon: Wayland Denis, DO;  Location: [facility];  Service: Plastics;  Laterality: Right;   MASTECTOMY  2012   rt prophalactic mast-snbx   MASTECTOMY MODIFIED RADICAL  2012   left-axillary nodes   NEUROMA SURGERY Bilateral    NOSE SURGERY  2007   PORT-A-CATH REMOVAL     insertion and   THROAT SURGERY     TONSILLECTOMY     TOTAL KNEE ARTHROPLASTY Right 08/12/2018   Procedure: TOTAL KNEE ARTHROPLASTY-RIGHT;  Surgeon: Christena Flake, MD;  Location: [facility];  Service: Orthopedics;  Laterality: Right;   TOTAL KNEE ARTHROPLASTY Left 09/22/2019   Procedure: TOTAL KNEE ARTHROPLASTY;  Surgeon: Christena Flake, MD;  Location: [facility];  Service: Orthopedics;  Laterality: Left;   UVULOPALATOPHARYNGOPLASTY      There were no vitals filed for this visit.   Subjective Assessment - 02/11/23 1521     Subjective  My hands been bothering me - R worse than the L and thumbs the worse- but the last 6 months the worse- My dad of 31 yrs old move in with [place] about 6 months ago and I need to help in in most everything -and my husband had Stent and need to do most everything at home    Pertinent History 02/11/23 Dr Allena Katz note: Krystal [name] is a 72 y.o. female is here today for follow up of osteoarthritis. The  patient's allergies, current medications, past family history, past medical history, past social history, past surgical history and problem list were reviewed and updated as appropriate.  She continues to have pain of the hand joints. She is taking Lyrica, Cymbalta and Plaquenil. She is not able to do things with her hands anymore. She has noticed some swelling of the hand joints. She has been offered [facility] surgery but she declined.Assessment and Plan  Osteoarthritis of multiple joints: active -- Unable to take NSAIDs due to being on blood thinners -- She states that Tramadol did not help. -- She states that Tylenol made her liver enzymes increase. -- Failed Plaquenil -- Continue Cymbalta. -- Trial of Methotrexate 10 mg weekly and Folic Acid 1 mg Daily -- Refer to OT  2. Fibromyalgia -- She is not taking gabapentin. -- Continue Lyrica 300 mg/day -- She continues to stay active.  3. Long term use of high risk medication -- Methotrexate is    Patient Stated Goals I don't want surgery - and want my pain in thumbs and fingers better so I can do things around the house, yard work, crafts, crochet, ,knit- and exercise classes    Currently in Pain? Yes    Pain Score 10-Worst pain ever   8 L hand   Pain Location Hand    Pain Orientation Right;Left    Pain Descriptors / Indicators Aching;Throbbing;Tightness    Pain Type Chronic pain    Pain Onset More than a month ago    Pain Frequency Constant               [facility] OT Assessment - 02/11/23 0001       Assessment   Medical Diagnosis OA in hands with pain    Referring Provider (OT) DR Allena Katz    Onset Date/Surgical Date 07/23/22    Hand Dominance Right      Home  Environment   Lives With Spouse      Prior Function   Vocation Retired    Leisure 93rd yrs old dad needs help, yardwork, house work, crafts , Programme researcher, broadcasting/film/video, crochet, knit      AROM   Right Wrist Extension 60 Degrees    Right Wrist Flexion 80 Degrees    Left Wrist Extension 70 Degrees     Left Wrist Flexion 90 Degrees      Strength   Right Hand Grip (lbs) 35   pain   Right Hand Lateral Pinch 11 lbs   pain   Right Hand 3 Point Pinch 8 lbs   pain   Left Hand Grip (lbs) 35   pain   Left Hand Lateral Pinch 14  lbs   pain   Left Hand 3 Point Pinch 12 lbs   pain     Right Hand AROM   R Thumb Radial ABduction/ADduction 0-55 50    R Thumb Palmar ABduction/ADduction 0-45 55    R Thumb Opposition to Index --   pain to 3rd and 4th     Left Hand AROM   L Thumb Radial ADduction/ABduction 0-55 40    L Thumb Palmar ADduction/ABduction 0-45 60    L Thumb Opposition to Index --   pain to 3rd and 4th              Contrast done to bilateral hands   Review and provided hand out on AE and modifications to ADL's and IADL's HEP contrast 2 x day  1 x day after contrast -AROM tendon glides - thumb PA and RA  Opposition pick up 2 cm foam block -use R CMC neoprene splint if needed   List of things she can have aid for dad do at home  Meal prep Groceries unpack and put away  Dishes put away Modifications to assist dad -to be more independent  Internet resources    Follow up in week                OT Education - 02/11/23 1532     Education Details Findings of eval and HEP    Person(s) Educated Patient    Methods Explanation;Demonstration;Tactile cues;Verbal cues;Handout    Comprehension Verbal cues required;Returned demonstration;Verbalized understanding                 OT Long Term Goals - 02/11/23 1536       OT LONG TERM GOAL #1   Title Pt to be independent in HEP to decrease pain to less than 5/10 while maintain AROM in bilateral hands    Baseline AROM in digits WNL - thumb decrease opposition and PA and RA - pain 10/10 R hand , L 8/10 - tenderness over several A1pulley's    Time 4    Period Weeks    Status New    Target Date 03/11/23      OT LONG TERM GOAL #2   Title Pt to verbalize 3 joint protection and AE to decrease pain and increase ease  of use of hands with less pain    Baseline no knowledge of AE or joint protection - 10/10 pain R hand , L 8/10    Time 6    Period Weeks    Status New    Target Date 03/25/23      OT LONG TERM GOAL #3   Title Pt show AROM WFL in bilateral hands with digits -  pain less than 4/10 during ADL's    Baseline R hand pain 10/10 , L 8/10 and  AROM WFL except thumb opposition, PA and RA    Time 6    Period Weeks    Status New    Target Date 03/25/23      OT LONG TERM GOAL #4   Title Pt to impliment lifestyle changes to increase time for self to do exercise and family to be more independent    Baseline Use to do aquitics exercises in water 6 months ago- help dad in everything now bathing, eating, dressing - more than 75 % - help husband - dad has aid 20 hrs week - carry and unpack self groceries - and prep and cook    Time 6  Period Weeks    Status New    Target Date 03/25/23                   Plan - 02/11/23 1534     OT Occupational Profile and History Problem Focused Assessment - Including review of records relating to presenting problem    Occupational performance deficits (Please refer to evaluation for details): ADL's;IADL's;Leisure;Social Participation;Play;Rest and Sleep    Body Structure / Function / Physical Skills ADL;Coordination;IADL;Pain;Strength;Edema;FMC;Decreased knowledge of use of DME;Flexibility;ROM;UE functional use    Rehab Potential Fair    Clinical Decision Making Several treatment options, min-mod task modification necessary    Comorbidities Affecting Occupational Performance: --   OA in multiple joints- pain 10/10   Modification or Assistance to Complete Evaluation  No modification of tasks or assist necessary to complete eval    OT Frequency 1x / week    OT Duration 6 weeks    OT Treatment/Interventions Self-care/ADL training;Therapeutic exercise;Patient/family education;Splinting;Fluidtherapy;Contrast Bath;Ultrasound;DME and/or AE instruction;Manual  Therapy;Passive range of motion;Paraffin;Iontophoresis    Consulted and Agree with Plan of Care Patient             Patient will benefit from skilled therapeutic intervention in order to improve the following deficits and impairments:   Body Structure / Function / Physical Skills: ADL, Coordination, IADL, Pain, Strength, Edema, FMC, Decreased knowledge of use of DME, Flexibility, ROM, UE functional use       Visit Diagnosis: Joint pain in both hands  Other lack of coordination    Problem List Patient Active Problem List   Diagnosis Date Noted   Raynaud's disease 11/05/2022   Osteoporosis, post-menopausal 11/05/2022   Atrial flutter (HCC) 10/02/2022   First degree AV block 09/11/2022   Arthritis 08/08/2022   Persistent atrial fibrillation (HCC) 08/06/2022   Depression 02/27/2022   Gastroesophageal reflux disease 02/27/2022   Hypertension 02/27/2022   Abnormal LFTs 02/27/2022   Alcohol abuse 02/27/2022   Atrial fibrillation with RVR (HCC) 02/27/2022   Morbid obesity (HCC) 02/27/2022   Pes anserinus bursitis of left knee 02/08/2020   Status post total knee replacement using cement, left 09/22/2019   Status post total knee replacement using cement, right 08/12/2018   Rotator cuff rupture    Status post bilateral breast reconstruction 02/06/2013   Acquired absence of bilateral breasts and nipples 09/17/2012   Breast cancer of lower-outer quadrant of left female breast (HCC) 01/16/2011    Oletta Cohn, OTR/L,CLT 02/11/2023, 3:44 PM  [facility] [address], [place], [address] Phone: [phone]   Fax:  [phone]  Name: Krystal [name] MRN: [id number] Date of Birth: [birth year]

## 2023-02-18 ENCOUNTER — Ambulatory Visit: Payer: Medicare HMO | Admitting: Occupational Therapy

## 2023-04-04 ENCOUNTER — Ambulatory Visit: Payer: Medicare HMO | Attending: Rheumatology | Admitting: Occupational Therapy

## 2023-04-04 DIAGNOSIS — M25541 Pain in joints of right hand: Secondary | ICD-10-CM | POA: Insufficient documentation

## 2023-04-04 DIAGNOSIS — M25542 Pain in joints of left hand: Secondary | ICD-10-CM | POA: Diagnosis not present

## 2023-04-04 DIAGNOSIS — R278 Other lack of coordination: Secondary | ICD-10-CM | POA: Insufficient documentation

## 2023-04-04 DIAGNOSIS — M6281 Muscle weakness (generalized): Secondary | ICD-10-CM | POA: Insufficient documentation

## 2023-04-04 NOTE — Therapy (Signed)
[facility] [address] [place], [place], [address] Phone: [phone]   Fax:  [phone]  Occupational Therapy Treatment  Patient Details  Name: Krystal [name] MRN: [id number] Date of Birth: 1951/01/13 Referring Provider (OT): DR Allena Katz   Encounter Date: 04/04/2023   OT End of Session - 04/04/23 1736     Visit Number 2    Number of Visits 6    Date for OT Re-Evaluation 05/02/23    OT Start Time 1400    OT Stop Time 1446    OT Time Calculation (min) 46 min    Activity Tolerance Patient tolerated treatment well    Behavior During Therapy [facility] for tasks assessed/performed             Past Medical History:  Diagnosis Date   Anemia    Arthritis    osteo   Blindness of right eye at birth   Cancer [facility]) 2011   Left breast 2011 and cervical age 89   Carpal tunnel syndrome    Cataract    Depression    Edema 07/27/2011   Fibromyalgia    muscle weakness and pain   GERD (gastroesophageal reflux disease)    Hyperlipidemia    Hypertension    benign   Lymphedema    Osteoporosis    Plantar fasciitis    Pre-diabetes    Raynaud's disease    Restless leg syndrome    Rosacea    Rotator cuff rupture    Sleep apnea    Synovitis    Ulcer     Past Surgical History:  Procedure Laterality Date   ABDOMINAL HYSTERECTOMY  1986   For bleeding and pain   BLADDER SURGERY  2006   Bladder tack   BREAST RECONSTRUCTION Bilateral 09/17/2012   Procedure: BREAST RECONSTRUCTION;  Surgeon: Wayland Denis, DO;  Location: [facility];  Service: Plastics;  Laterality: Bilateral;  BILATERAL BREAST RECONSTRUCTION WITH TISSUE EXPANDERS AND ALLOMED   BREAST RECONSTRUCTION Right 06/03/2013   Procedure: RIGHT BREAST CAPSULE CONSTRACTURE;  Surgeon: Wayland Denis, DO;  Location: [facility];  Service: Plastics;  Laterality: Right;   CARDIOVERSION N/A 08/06/2022   Procedure: CARDIOVERSION;  Surgeon: Iran Ouch,  MD;  Location: [facility];  Service: Cardiovascular;  Laterality: N/A;   CARDIOVERSION N/A 10/02/2022   Procedure: CARDIOVERSION;  Surgeon: Yvonne Kendall, MD;  Location: [facility];  Service: Cardiovascular;  Laterality: N/A;   CARPAL TUNNEL RELEASE Bilateral 2008   EYE SURGERY Right    INJECTION KNEE Left 08/12/2018   Procedure: KNEE INJECTION-LEFT;  Surgeon: Christena Flake, MD;  Location: [facility];  Service: Orthopedics;  Laterality: Left;   JOINT REPLACEMENT     LIPOSUCTION Bilateral 01/29/2013   Procedure: LIPOSUCTION;  Surgeon: Wayland Denis, DO;  Location: [facility];  Service: Plastics;  Laterality: Bilateral;   LIPOSUCTION Right 06/03/2013   Procedure: LIPOSUCTION;  Surgeon: Wayland Denis, DO;  Location: [facility];  Service: Plastics;  Laterality: Right;   MASTECTOMY  2012   rt prophalactic mast-snbx   MASTECTOMY MODIFIED RADICAL  2012   left-axillary nodes   NEUROMA SURGERY Bilateral    NOSE SURGERY  2007   PORT-A-CATH REMOVAL     insertion and   THROAT SURGERY     TONSILLECTOMY     TOTAL KNEE ARTHROPLASTY Right 08/12/2018   Procedure: TOTAL KNEE ARTHROPLASTY-RIGHT;  Surgeon: Christena Flake, MD;  Location: [facility];  Service: Orthopedics;  Laterality: Right;   TOTAL KNEE ARTHROPLASTY Left 09/22/2019   Procedure: TOTAL KNEE ARTHROPLASTY;  Surgeon: Christena Flake, MD;  Location: [facility];  Service: Orthopedics;  Laterality: Left;   UVULOPALATOPHARYNGOPLASTY      There were no vitals filed for this visit.   Subjective Assessment - 04/04/23 1733     Subjective  I made some changes since I seen you 1-2 months ago -my dad in ass living now and I am joining the pool again -trying to use my hands smarter - but still need to do alot at home    Pertinent History 02/11/23 Dr Allena Katz note: Krystal [name] is a 72 y.o. female is here today for follow up of osteoarthritis. The patient's allergies, current medications, past family history, past medical history, past  social history, past surgical history and problem list were reviewed and updated as appropriate.  She continues to have pain of the hand joints. She is taking Lyrica, Cymbalta and Plaquenil. She is not able to do things with her hands anymore. She has noticed some swelling of the hand joints. She has been offered [facility] surgery but she declined.Assessment and Plan  Osteoarthritis of multiple joints: active -- Unable to take NSAIDs due to being on blood thinners -- She states that Tramadol did not help. -- She states that Tylenol made her liver enzymes increase. -- Failed Plaquenil -- Continue Cymbalta. -- Trial of Methotrexate 10 mg weekly and Folic Acid 1 mg Daily -- Refer to OT  2. Fibromyalgia -- She is not taking gabapentin. -- Continue Lyrica 300 mg/day -- She continues to stay active.  3. Long term use of high risk medication -- Methotrexate is    Patient Stated Goals I don't want surgery - and want my pain in thumbs and fingers better so I can do things around the house, yard work, crafts, crochet, ,knit- and exercise classes    Currently in Pain? Yes    Pain Score 5     Pain Location Hand    Pain Orientation Right;Left    Pain Descriptors / Indicators Aching;Tightness    Pain Type Chronic pain    Pain Onset More than a month ago    Pain Frequency Constant                [facility] OT Assessment - 04/04/23 0001       AROM   Right Wrist Extension 60 Degrees    Right Wrist Flexion 80 Degrees    Left Wrist Extension 70 Degrees    Left Wrist Flexion 90 Degrees      Strength   Right Hand Grip (lbs) 35    Right Hand Lateral Pinch 14 lbs    Right Hand 3 Point Pinch 8 lbs    Left Hand Grip (lbs) 40    Left Hand Lateral Pinch 13 lbs    Left Hand 3 Point Pinch 8 lbs             Pt to cont with Contrast done to bilateral hands  But can try paraffin to DIP/PIP's prior to intrinsic fist - pt has paraffin at home    Review again and reinforce AE and modifications to ADL's and IADL's  1  x day after contrast -AROM tendon glides - thumb PA and RA  Opposition pick up 2 cm foam block -use R CMC neoprene splint if needed  Do focus on painfree intrinsic fist - after parafin to DIP/PIP's prior to fisting  Assess if  doing better and less pain    Review again   Internet resources   Fitted with new silicon sleeves for 3 digits and cont isotoner glove night time  Follow up next week -plan ionto on A1pulley of R 3rd and 4th  And L 2nd thru 4th  9/10 tenderness over A1pulley - triggering reported            OT Treatments/Exercises (OP) - 04/04/23 0001       RUE Paraffin   Number Minutes Paraffin 8 Minutes    RUE Paraffin Location --   DIPand PIP of hand   Comments decrease stiffness      LUE Contrast Bath   Time 8 minutes    Comments hand prior to ROM decrease edema and pain                    OT Education - 04/04/23 1735     Education Details progress and changes to HEP    Person(s) Educated Patient    Methods Explanation;Demonstration;Tactile cues;Verbal cues;Handout    Comprehension Verbal cues required;Returned demonstration;Verbalized understanding                 OT Long Term Goals - 04/04/23 1739       OT LONG TERM GOAL #1   Title Pt to be independent in HEP to decrease pain to less than 5/10 while maintain AROM in bilateral hands    Baseline AROM in digits WNL - thumb decrease opposition and PA and RA - pain 10/10 R hand , L 8/10 - tenderness over several A1pulley's    Time 4    Period Weeks    Status On-going    Target Date 05/02/23      OT LONG TERM GOAL #2   Title Pt to verbalize 3 joint protection and AE to decrease pain and increase ease of use of hands with less pain    Baseline no knowledge of AE or joint protection - 10/10 pain R hand , L 8/10- NOW done some changes to pain same    Time 4    Period Weeks    Status On-going    Target Date 05/02/23      OT LONG TERM GOAL #3   Title Pt show AROM WFL in bilateral hands  with digits -  pain less than 4/10 during ADL's    Baseline R hand pain 10/10 , L 8/10 and  AROM WFL except thumb opposition, PA and RA    Time 4    Period Weeks    Status On-going    Target Date 05/02/23      OT LONG TERM GOAL #4   Title Pt to impliment lifestyle changes to increase time for self to do exercise and family to be more independent    Baseline Use to do aquitics exercises in water 6 months ago- help dad in everything now bathing, eating, dressing - more than 75 % - help husband - dad has aid 20 hrs week - carry and unpack self groceries - and prep and cook NOW dad in ass living , and starting soon water exercise clasess    Time 4    Period Weeks    Status On-going    Target Date 05/02/23                   Plan - 04/04/23 1736     Clinical Impression Statement Pt refer with pain in bilateral hands -  OA and changes to bilateral hands , all digits and joints - pt report triggering in am mostly but some during day in R 3rd and 4th and L 3rd thru 5th - tenderness over A1pulley's - pt AROM able to touch palm and grip and prehension for her age [facility] -but pain and edema increase greatly to 10/10 at times. Pt can benefit from skilled OT services to decrease pain , edema- ed on modifcations and AE to decrease pain and increase ease of use of bilateral hands in ADL's and IADL's - pt made some changes to lifestyle the last 1-2 months since seen last time    OT Occupational Profile and History Problem Focused Assessment - Including review of records relating to presenting problem    Occupational performance deficits (Please refer to evaluation for details): ADL's;IADL's;Leisure;Social Participation;Play;Rest and Sleep    Body Structure / Function / Physical Skills ADL;Coordination;IADL;Pain;Strength;Edema;FMC;Decreased knowledge of use of DME;Flexibility;ROM;UE functional use    Rehab Potential Fair    Clinical Decision Making Several treatment options, min-mod task modification  necessary    Modification or Assistance to Complete Evaluation  No modification of tasks or assist necessary to complete eval    OT Frequency 2x / week    OT Duration 4 weeks    OT Treatment/Interventions Self-care/ADL training;Therapeutic exercise;Patient/family education;Splinting;Fluidtherapy;Contrast Bath;Ultrasound;DME and/or AE instruction;Manual Therapy;Passive range of motion;Paraffin;Iontophoresis    Consulted and Agree with Plan of Care Patient             Patient will benefit from skilled therapeutic intervention in order to improve the following deficits and impairments:   Body Structure / Function / Physical Skills: ADL, Coordination, IADL, Pain, Strength, Edema, FMC, Decreased knowledge of use of DME, Flexibility, ROM, UE functional use       Visit Diagnosis: Joint pain in both hands  Other lack of coordination  Muscle weakness (generalized)    Problem List Patient Active Problem List   Diagnosis Date Noted   Raynaud's disease 11/05/2022   Osteoporosis, post-menopausal 11/05/2022   Atrial flutter (HCC) 10/02/2022   First degree AV block 09/11/2022   Arthritis 08/08/2022   Persistent atrial fibrillation (HCC) 08/06/2022   Depression 02/27/2022   Gastroesophageal reflux disease 02/27/2022   Hypertension 02/27/2022   Abnormal LFTs 02/27/2022   Alcohol abuse 02/27/2022   Atrial fibrillation with RVR (HCC) 02/27/2022   Morbid obesity (HCC) 02/27/2022   Pes anserinus bursitis of left knee 02/08/2020   Status post total knee replacement using cement, left 09/22/2019   Status post total knee replacement using cement, right 08/12/2018   Rotator cuff rupture    Status post bilateral breast reconstruction 02/06/2013   Acquired absence of bilateral breasts and nipples 09/17/2012   Breast cancer of lower-outer quadrant of left female breast (HCC) 01/16/2011    Oletta Cohn, OTR/L,CLT 04/04/2023, 5:43 PM  [facility] [address] [place], [address] Phone: [phone]   Fax:  [phone]  Name: Krystal [name] MRN: [id number] Date of Birth: 09/07/1950

## 2023-04-05 ENCOUNTER — Other Ambulatory Visit: Payer: Medicare HMO

## 2023-04-08 ENCOUNTER — Ambulatory Visit: Payer: Medicare HMO | Admitting: Occupational Therapy

## 2023-04-08 DIAGNOSIS — M25541 Pain in joints of right hand: Secondary | ICD-10-CM | POA: Diagnosis not present

## 2023-04-08 DIAGNOSIS — R278 Other lack of coordination: Secondary | ICD-10-CM

## 2023-04-08 DIAGNOSIS — M6281 Muscle weakness (generalized): Secondary | ICD-10-CM | POA: Diagnosis not present

## 2023-04-08 DIAGNOSIS — M25542 Pain in joints of left hand: Secondary | ICD-10-CM

## 2023-04-08 NOTE — Therapy (Signed)
date IONto on bilateral 3rd and 4th A1pulley- L trigger more than R .  Pt can benefit from skilled OT services to decrease pain , edema- ed on modifcations and AE to decrease pain and increase ease of use of bilateral hands in ADL's and IADL's - pt made some changes to lifestyle the last 1-2 months since seen last time    OT Occupational Profile and History Problem Focused Assessment - Including review of records relating to presenting problem    Occupational performance deficits (Please refer to evaluation for details): ADL's;IADL's;Leisure;Social Participation;Play;Rest and Sleep    Body Structure / Function / Physical Skills ADL;Coordination;IADL;Pain;Strength;Edema;FMC;Decreased knowledge of use of DME;Flexibility;ROM;UE functional use    Rehab Potential Fair    Clinical Decision Making Several treatment options, min-mod task modification necessary    Modification or Assistance to Complete Evaluation  No modification  of tasks or assist necessary to complete eval    OT Frequency 2x / week    OT Duration 4 weeks    OT Treatment/Interventions Self-care/ADL training;Therapeutic exercise;Patient/family education;Splinting;Fluidtherapy;Contrast Bath;Ultrasound;DME and/or AE instruction;Manual Therapy;Passive range of motion;Paraffin;Iontophoresis    Consulted and Agree with Plan of Care Patient             Patient will benefit from skilled therapeutic intervention in order to improve the following deficits and impairments:   Body Structure / Function / Physical Skills: ADL, Coordination, IADL, Pain, Strength, Edema, FMC, Decreased knowledge of use of DME, Flexibility, ROM, UE functional use       Visit Diagnosis: Joint pain in both hands  Other lack of coordination  Muscle weakness (generalized)    Problem List Patient Active Problem List   Diagnosis Date Noted   Raynaud's disease 11/05/2022   Osteoporosis, post-menopausal 11/05/2022   Atrial flutter (HCC) 10/02/2022   First degree AV block 09/11/2022   Arthritis 08/08/2022   Persistent atrial fibrillation (HCC) 08/06/2022   Depression 02/27/2022   Gastroesophageal reflux disease 02/27/2022   Hypertension 02/27/2022   Abnormal LFTs 02/27/2022   Alcohol abuse 02/27/2022   Atrial fibrillation with RVR (HCC) 02/27/2022   Morbid obesity (HCC) 02/27/2022   Pes anserinus bursitis of left knee 02/08/2020   Status post total knee replacement using cement, left 09/22/2019   Status post total knee replacement using cement, right 08/12/2018   Rotator cuff rupture    Status post bilateral breast reconstruction 02/06/2013   Acquired absence of bilateral breasts and nipples 09/17/2012   Breast cancer of lower-outer quadrant of left female breast (HCC) 01/16/2011    Oletta Cohn, OTR/L,CLT 04/08/2023, 3:12 PM  [facility] [address], [place], [address] Phone: [phone]    Fax:  [phone]  Name: Krystal [name] MRN: [id number] Date of Birth: 1950/12/10  date IONto on bilateral 3rd and 4th A1pulley- L trigger more than R .  Pt can benefit from skilled OT services to decrease pain , edema- ed on modifcations and AE to decrease pain and increase ease of use of bilateral hands in ADL's and IADL's - pt made some changes to lifestyle the last 1-2 months since seen last time    OT Occupational Profile and History Problem Focused Assessment - Including review of records relating to presenting problem    Occupational performance deficits (Please refer to evaluation for details): ADL's;IADL's;Leisure;Social Participation;Play;Rest and Sleep    Body Structure / Function / Physical Skills ADL;Coordination;IADL;Pain;Strength;Edema;FMC;Decreased knowledge of use of DME;Flexibility;ROM;UE functional use    Rehab Potential Fair    Clinical Decision Making Several treatment options, min-mod task modification necessary    Modification or Assistance to Complete Evaluation  No modification  of tasks or assist necessary to complete eval    OT Frequency 2x / week    OT Duration 4 weeks    OT Treatment/Interventions Self-care/ADL training;Therapeutic exercise;Patient/family education;Splinting;Fluidtherapy;Contrast Bath;Ultrasound;DME and/or AE instruction;Manual Therapy;Passive range of motion;Paraffin;Iontophoresis    Consulted and Agree with Plan of Care Patient             Patient will benefit from skilled therapeutic intervention in order to improve the following deficits and impairments:   Body Structure / Function / Physical Skills: ADL, Coordination, IADL, Pain, Strength, Edema, FMC, Decreased knowledge of use of DME, Flexibility, ROM, UE functional use       Visit Diagnosis: Joint pain in both hands  Other lack of coordination  Muscle weakness (generalized)    Problem List Patient Active Problem List   Diagnosis Date Noted   Raynaud's disease 11/05/2022   Osteoporosis, post-menopausal 11/05/2022   Atrial flutter (HCC) 10/02/2022   First degree AV block 09/11/2022   Arthritis 08/08/2022   Persistent atrial fibrillation (HCC) 08/06/2022   Depression 02/27/2022   Gastroesophageal reflux disease 02/27/2022   Hypertension 02/27/2022   Abnormal LFTs 02/27/2022   Alcohol abuse 02/27/2022   Atrial fibrillation with RVR (HCC) 02/27/2022   Morbid obesity (HCC) 02/27/2022   Pes anserinus bursitis of left knee 02/08/2020   Status post total knee replacement using cement, left 09/22/2019   Status post total knee replacement using cement, right 08/12/2018   Rotator cuff rupture    Status post bilateral breast reconstruction 02/06/2013   Acquired absence of bilateral breasts and nipples 09/17/2012   Breast cancer of lower-outer quadrant of left female breast (HCC) 01/16/2011    Oletta Cohn, OTR/L,CLT 04/08/2023, 3:12 PM  [facility] [address], [place], [address] Phone: [phone]    Fax:  [phone]  Name: Krystal [name] MRN: [id number] Date of Birth: 1950/12/10  [facility] [address] [place], [place], [address] Phone: [phone]   Fax:  [phone]  Occupational Therapy Treatment  Patient Details  Name: Krystal [name] MRN: [id number] Date of Birth: Feb 06, 1951 Referring Provider (OT): DR Allena Katz   Encounter Date: 04/08/2023   OT End of Session - 04/08/23 1509     Visit Number 3    Number of Visits 6    Date for OT Re-Evaluation 05/02/23    OT Start Time 1432    OT Stop Time 1520    OT Time Calculation (min) 48 min    Activity Tolerance Patient tolerated treatment well    Behavior During Therapy [facility] for tasks assessed/performed             Past Medical History:  Diagnosis Date   Anemia    Arthritis    osteo   Blindness of right eye at birth   Cancer [facility]) 2011   Left breast 2011 and cervical age 31   Carpal tunnel syndrome    Cataract    Depression    Edema 07/27/2011   Fibromyalgia    muscle weakness and pain   GERD (gastroesophageal reflux disease)    Hyperlipidemia    Hypertension    benign   Lymphedema    Osteoporosis    Plantar fasciitis    Pre-diabetes    Raynaud's disease    Restless leg syndrome    Rosacea    Rotator cuff rupture    Sleep apnea    Synovitis    Ulcer     Past Surgical History:  Procedure Laterality Date   ABDOMINAL HYSTERECTOMY  1986   For bleeding and pain   BLADDER SURGERY  2006   Bladder tack   BREAST RECONSTRUCTION Bilateral 09/17/2012   Procedure: BREAST RECONSTRUCTION;  Surgeon: Wayland Denis, DO;  Location: [facility];  Service: Plastics;  Laterality: Bilateral;  BILATERAL BREAST RECONSTRUCTION WITH TISSUE EXPANDERS AND ALLOMED   BREAST RECONSTRUCTION Right 06/03/2013   Procedure: RIGHT BREAST CAPSULE CONSTRACTURE;  Surgeon: Wayland Denis, DO;  Location: [facility];  Service: Plastics;  Laterality: Right;   CARDIOVERSION N/A 08/06/2022   Procedure: CARDIOVERSION;  Surgeon: Iran Ouch,  MD;  Location: [facility];  Service: Cardiovascular;  Laterality: N/A;   CARDIOVERSION N/A 10/02/2022   Procedure: CARDIOVERSION;  Surgeon: Yvonne Kendall, MD;  Location: [facility];  Service: Cardiovascular;  Laterality: N/A;   CARPAL TUNNEL RELEASE Bilateral 2008   EYE SURGERY Right    INJECTION KNEE Left 08/12/2018   Procedure: KNEE INJECTION-LEFT;  Surgeon: Christena Flake, MD;  Location: [facility];  Service: Orthopedics;  Laterality: Left;   JOINT REPLACEMENT     LIPOSUCTION Bilateral 01/29/2013   Procedure: LIPOSUCTION;  Surgeon: Wayland Denis, DO;  Location: [facility];  Service: Plastics;  Laterality: Bilateral;   LIPOSUCTION Right 06/03/2013   Procedure: LIPOSUCTION;  Surgeon: Wayland Denis, DO;  Location: [facility];  Service: Plastics;  Laterality: Right;   MASTECTOMY  2012   rt prophalactic mast-snbx   MASTECTOMY MODIFIED RADICAL  2012   left-axillary nodes   NEUROMA SURGERY Bilateral    NOSE SURGERY  2007   PORT-A-CATH REMOVAL     insertion and   THROAT SURGERY     TONSILLECTOMY     TOTAL KNEE ARTHROPLASTY Right 08/12/2018   Procedure: TOTAL KNEE ARTHROPLASTY-RIGHT;  Surgeon: Christena Flake, MD;  Location: [facility];  Service: Orthopedics;  date IONto on bilateral 3rd and 4th A1pulley- L trigger more than R .  Pt can benefit from skilled OT services to decrease pain , edema- ed on modifcations and AE to decrease pain and increase ease of use of bilateral hands in ADL's and IADL's - pt made some changes to lifestyle the last 1-2 months since seen last time    OT Occupational Profile and History Problem Focused Assessment - Including review of records relating to presenting problem    Occupational performance deficits (Please refer to evaluation for details): ADL's;IADL's;Leisure;Social Participation;Play;Rest and Sleep    Body Structure / Function / Physical Skills ADL;Coordination;IADL;Pain;Strength;Edema;FMC;Decreased knowledge of use of DME;Flexibility;ROM;UE functional use    Rehab Potential Fair    Clinical Decision Making Several treatment options, min-mod task modification necessary    Modification or Assistance to Complete Evaluation  No modification  of tasks or assist necessary to complete eval    OT Frequency 2x / week    OT Duration 4 weeks    OT Treatment/Interventions Self-care/ADL training;Therapeutic exercise;Patient/family education;Splinting;Fluidtherapy;Contrast Bath;Ultrasound;DME and/or AE instruction;Manual Therapy;Passive range of motion;Paraffin;Iontophoresis    Consulted and Agree with Plan of Care Patient             Patient will benefit from skilled therapeutic intervention in order to improve the following deficits and impairments:   Body Structure / Function / Physical Skills: ADL, Coordination, IADL, Pain, Strength, Edema, FMC, Decreased knowledge of use of DME, Flexibility, ROM, UE functional use       Visit Diagnosis: Joint pain in both hands  Other lack of coordination  Muscle weakness (generalized)    Problem List Patient Active Problem List   Diagnosis Date Noted   Raynaud's disease 11/05/2022   Osteoporosis, post-menopausal 11/05/2022   Atrial flutter (HCC) 10/02/2022   First degree AV block 09/11/2022   Arthritis 08/08/2022   Persistent atrial fibrillation (HCC) 08/06/2022   Depression 02/27/2022   Gastroesophageal reflux disease 02/27/2022   Hypertension 02/27/2022   Abnormal LFTs 02/27/2022   Alcohol abuse 02/27/2022   Atrial fibrillation with RVR (HCC) 02/27/2022   Morbid obesity (HCC) 02/27/2022   Pes anserinus bursitis of left knee 02/08/2020   Status post total knee replacement using cement, left 09/22/2019   Status post total knee replacement using cement, right 08/12/2018   Rotator cuff rupture    Status post bilateral breast reconstruction 02/06/2013   Acquired absence of bilateral breasts and nipples 09/17/2012   Breast cancer of lower-outer quadrant of left female breast (HCC) 01/16/2011    Oletta Cohn, OTR/L,CLT 04/08/2023, 3:12 PM  [facility] [address], [place], [address] Phone: [phone]    Fax:  [phone]  Name: Krystal [name] MRN: [id number] Date of Birth: 1950/12/10

## 2023-04-09 DIAGNOSIS — Z0100 Encounter for examination of eyes and vision without abnormal findings: Secondary | ICD-10-CM | POA: Diagnosis not present

## 2023-04-12 ENCOUNTER — Ambulatory Visit: Payer: Medicare HMO | Admitting: Occupational Therapy

## 2023-04-12 DIAGNOSIS — M25542 Pain in joints of left hand: Secondary | ICD-10-CM

## 2023-04-12 DIAGNOSIS — M25541 Pain in joints of right hand: Secondary | ICD-10-CM | POA: Diagnosis not present

## 2023-04-12 DIAGNOSIS — M6281 Muscle weakness (generalized): Secondary | ICD-10-CM

## 2023-04-12 DIAGNOSIS — R278 Other lack of coordination: Secondary | ICD-10-CM | POA: Diagnosis not present

## 2023-04-12 NOTE — Therapy (Signed)
[address] Phone: [phone]   Fax:  [phone]  Name: Krystal [name] MRN: [id number] Date of Birth: 1951-04-07  [address] Phone: [phone]   Fax:  [phone]  Name: Krystal [name] MRN: [id number] Date of Birth: 1951-04-07  [facility] [address] [place], [place], [address] Phone: [phone]   Fax:  [phone]  Occupational Therapy Treatment  Patient Details  Name: Krystal [name] MRN: [id number] Date of Birth: July 11, 1951 Referring Provider (OT): DR Allena Katz   Encounter Date: 04/12/2023   OT End of Session - 04/12/23 1133     Visit Number 4    Number of Visits 6    Date for OT Re-Evaluation 05/02/23    OT Start Time 0817    OT Stop Time 0910    OT Time Calculation (min) 53 min    Activity Tolerance Patient tolerated treatment well    Behavior During Therapy [facility] for tasks assessed/performed             Past Medical History:  Diagnosis Date   Anemia    Arthritis    osteo   Blindness of right eye at birth   Cancer [facility]) 2011   Left breast 2011 and cervical age 89   Carpal tunnel syndrome    Cataract    Depression    Edema 07/27/2011   Fibromyalgia    muscle weakness and pain   GERD (gastroesophageal reflux disease)    Hyperlipidemia    Hypertension    benign   Lymphedema    Osteoporosis    Plantar fasciitis    Pre-diabetes    Raynaud's disease    Restless leg syndrome    Rosacea    Rotator cuff rupture    Sleep apnea    Synovitis    Ulcer     Past Surgical History:  Procedure Laterality Date   ABDOMINAL HYSTERECTOMY  1986   For bleeding and pain   BLADDER SURGERY  2006   Bladder tack   BREAST RECONSTRUCTION Bilateral 09/17/2012   Procedure: BREAST RECONSTRUCTION;  Surgeon: Wayland Denis, DO;  Location: [facility];  Service: Plastics;  Laterality: Bilateral;  BILATERAL BREAST RECONSTRUCTION WITH TISSUE EXPANDERS AND ALLOMED   BREAST RECONSTRUCTION Right 06/03/2013   Procedure: RIGHT BREAST CAPSULE CONSTRACTURE;  Surgeon: Wayland Denis, DO;  Location: [facility];  Service: Plastics;  Laterality: Right;   CARDIOVERSION N/A 08/06/2022   Procedure: CARDIOVERSION;  Surgeon: Iran Ouch,  MD;  Location: [facility];  Service: Cardiovascular;  Laterality: N/A;   CARDIOVERSION N/A 10/02/2022   Procedure: CARDIOVERSION;  Surgeon: Yvonne Kendall, MD;  Location: [facility];  Service: Cardiovascular;  Laterality: N/A;   CARPAL TUNNEL RELEASE Bilateral 2008   EYE SURGERY Right    INJECTION KNEE Left 08/12/2018   Procedure: KNEE INJECTION-LEFT;  Surgeon: Christena Flake, MD;  Location: [facility];  Service: Orthopedics;  Laterality: Left;   JOINT REPLACEMENT     LIPOSUCTION Bilateral 01/29/2013   Procedure: LIPOSUCTION;  Surgeon: Wayland Denis, DO;  Location: [facility];  Service: Plastics;  Laterality: Bilateral;   LIPOSUCTION Right 06/03/2013   Procedure: LIPOSUCTION;  Surgeon: Wayland Denis, DO;  Location: [facility];  Service: Plastics;  Laterality: Right;   MASTECTOMY  2012   rt prophalactic mast-snbx   MASTECTOMY MODIFIED RADICAL  2012   left-axillary nodes   NEUROMA SURGERY Bilateral    NOSE SURGERY  2007   PORT-A-CATH REMOVAL     insertion and   THROAT SURGERY     TONSILLECTOMY     TOTAL KNEE ARTHROPLASTY Right 08/12/2018   Procedure: TOTAL KNEE ARTHROPLASTY-RIGHT;  Surgeon: Christena Flake, MD;  Location: [facility];  Service: Orthopedics;  [address] Phone: [phone]   Fax:  [phone]  Name: Krystal [name] MRN: [id number] Date of Birth: 1951-04-07  [facility] [address] [place], [place], [address] Phone: [phone]   Fax:  [phone]  Occupational Therapy Treatment  Patient Details  Name: Krystal [name] MRN: [id number] Date of Birth: July 11, 1951 Referring Provider (OT): DR Allena Katz   Encounter Date: 04/12/2023   OT End of Session - 04/12/23 1133     Visit Number 4    Number of Visits 6    Date for OT Re-Evaluation 05/02/23    OT Start Time 0817    OT Stop Time 0910    OT Time Calculation (min) 53 min    Activity Tolerance Patient tolerated treatment well    Behavior During Therapy [facility] for tasks assessed/performed             Past Medical History:  Diagnosis Date   Anemia    Arthritis    osteo   Blindness of right eye at birth   Cancer [facility]) 2011   Left breast 2011 and cervical age 89   Carpal tunnel syndrome    Cataract    Depression    Edema 07/27/2011   Fibromyalgia    muscle weakness and pain   GERD (gastroesophageal reflux disease)    Hyperlipidemia    Hypertension    benign   Lymphedema    Osteoporosis    Plantar fasciitis    Pre-diabetes    Raynaud's disease    Restless leg syndrome    Rosacea    Rotator cuff rupture    Sleep apnea    Synovitis    Ulcer     Past Surgical History:  Procedure Laterality Date   ABDOMINAL HYSTERECTOMY  1986   For bleeding and pain   BLADDER SURGERY  2006   Bladder tack   BREAST RECONSTRUCTION Bilateral 09/17/2012   Procedure: BREAST RECONSTRUCTION;  Surgeon: Wayland Denis, DO;  Location: [facility];  Service: Plastics;  Laterality: Bilateral;  BILATERAL BREAST RECONSTRUCTION WITH TISSUE EXPANDERS AND ALLOMED   BREAST RECONSTRUCTION Right 06/03/2013   Procedure: RIGHT BREAST CAPSULE CONSTRACTURE;  Surgeon: Wayland Denis, DO;  Location: [facility];  Service: Plastics;  Laterality: Right;   CARDIOVERSION N/A 08/06/2022   Procedure: CARDIOVERSION;  Surgeon: Iran Ouch,  MD;  Location: [facility];  Service: Cardiovascular;  Laterality: N/A;   CARDIOVERSION N/A 10/02/2022   Procedure: CARDIOVERSION;  Surgeon: Yvonne Kendall, MD;  Location: [facility];  Service: Cardiovascular;  Laterality: N/A;   CARPAL TUNNEL RELEASE Bilateral 2008   EYE SURGERY Right    INJECTION KNEE Left 08/12/2018   Procedure: KNEE INJECTION-LEFT;  Surgeon: Christena Flake, MD;  Location: [facility];  Service: Orthopedics;  Laterality: Left;   JOINT REPLACEMENT     LIPOSUCTION Bilateral 01/29/2013   Procedure: LIPOSUCTION;  Surgeon: Wayland Denis, DO;  Location: [facility];  Service: Plastics;  Laterality: Bilateral;   LIPOSUCTION Right 06/03/2013   Procedure: LIPOSUCTION;  Surgeon: Wayland Denis, DO;  Location: [facility];  Service: Plastics;  Laterality: Right;   MASTECTOMY  2012   rt prophalactic mast-snbx   MASTECTOMY MODIFIED RADICAL  2012   left-axillary nodes   NEUROMA SURGERY Bilateral    NOSE SURGERY  2007   PORT-A-CATH REMOVAL     insertion and   THROAT SURGERY     TONSILLECTOMY     TOTAL KNEE ARTHROPLASTY Right 08/12/2018   Procedure: TOTAL KNEE ARTHROPLASTY-RIGHT;  Surgeon: Christena Flake, MD;  Location: [facility];  Service: Orthopedics;

## 2023-04-15 ENCOUNTER — Ambulatory Visit: Payer: Medicare HMO | Admitting: Occupational Therapy

## 2023-04-15 ENCOUNTER — Telehealth: Payer: Self-pay | Admitting: Adult Health

## 2023-04-15 ENCOUNTER — Encounter: Payer: Self-pay | Admitting: Adult Health

## 2023-04-15 ENCOUNTER — Inpatient Hospital Stay: Payer: Medicare HMO | Attending: Adult Health | Admitting: Adult Health

## 2023-04-15 VITALS — BP 149/82 | HR 117 | Temp 97.9°F | Resp 18 | Ht 63.0 in | Wt 175.7 lb

## 2023-04-15 DIAGNOSIS — Z9221 Personal history of antineoplastic chemotherapy: Secondary | ICD-10-CM | POA: Diagnosis not present

## 2023-04-15 DIAGNOSIS — M25541 Pain in joints of right hand: Secondary | ICD-10-CM

## 2023-04-15 DIAGNOSIS — M1990 Unspecified osteoarthritis, unspecified site: Secondary | ICD-10-CM | POA: Insufficient documentation

## 2023-04-15 DIAGNOSIS — Z9013 Acquired absence of bilateral breasts and nipples: Secondary | ICD-10-CM | POA: Insufficient documentation

## 2023-04-15 DIAGNOSIS — Z853 Personal history of malignant neoplasm of breast: Secondary | ICD-10-CM | POA: Diagnosis not present

## 2023-04-15 DIAGNOSIS — Z807 Family history of other malignant neoplasms of lymphoid, hematopoietic and related tissues: Secondary | ICD-10-CM | POA: Diagnosis not present

## 2023-04-15 DIAGNOSIS — Z800 Family history of malignant neoplasm of digestive organs: Secondary | ICD-10-CM | POA: Insufficient documentation

## 2023-04-15 DIAGNOSIS — R278 Other lack of coordination: Secondary | ICD-10-CM | POA: Diagnosis not present

## 2023-04-15 DIAGNOSIS — M6281 Muscle weakness (generalized): Secondary | ICD-10-CM

## 2023-04-15 DIAGNOSIS — Z90710 Acquired absence of both cervix and uterus: Secondary | ICD-10-CM | POA: Insufficient documentation

## 2023-04-15 DIAGNOSIS — T451X5A Adverse effect of antineoplastic and immunosuppressive drugs, initial encounter: Secondary | ICD-10-CM | POA: Diagnosis not present

## 2023-04-15 DIAGNOSIS — Z170 Estrogen receptor positive status [ER+]: Secondary | ICD-10-CM

## 2023-04-15 DIAGNOSIS — M25542 Pain in joints of left hand: Secondary | ICD-10-CM | POA: Diagnosis not present

## 2023-04-15 DIAGNOSIS — Z801 Family history of malignant neoplasm of trachea, bronchus and lung: Secondary | ICD-10-CM | POA: Insufficient documentation

## 2023-04-15 DIAGNOSIS — Z803 Family history of malignant neoplasm of breast: Secondary | ICD-10-CM | POA: Insufficient documentation

## 2023-04-15 DIAGNOSIS — G620 Drug-induced polyneuropathy: Secondary | ICD-10-CM | POA: Diagnosis not present

## 2023-04-15 NOTE — Therapy (Signed)
[facility] [address] [place], [place], [address] Phone: [phone]   Fax:  [phone]  Occupational Therapy Treatment  Patient Details  Name: Krystal [name] MRN: [id number] Date of Birth: [birth year] Referring Provider (OT): DR Allena Katz   Encounter Date: 04/15/2023   OT End of Session - 04/15/23 1645     Visit Number 5    Number of Visits 6    Date for OT Re-Evaluation 05/02/23    OT Start Time 1610    OT Stop Time 1655    OT Time Calculation (min) 45 min    Activity Tolerance Patient tolerated treatment well    Behavior During Therapy [facility] for tasks assessed/performed             Past Medical History:  Diagnosis Date   Anemia    Arthritis    osteo   Blindness of right eye at birth   Cancer [facility]) 2011   Left breast 2011 and cervical age 87   Carpal tunnel syndrome    Cataract    Depression    Edema 07/27/2011   Fibromyalgia    muscle weakness and pain   GERD (gastroesophageal reflux disease)    Hyperlipidemia    Hypertension    benign   Lymphedema    Osteoporosis    Plantar fasciitis    Pre-diabetes    Raynaud's disease    Restless leg syndrome    Rosacea    Rotator cuff rupture    Sleep apnea    Synovitis    Ulcer     Past Surgical History:  Procedure Laterality Date   ABDOMINAL HYSTERECTOMY  1986   For bleeding and pain   BLADDER SURGERY  2006   Bladder tack   BREAST RECONSTRUCTION Bilateral 09/17/2012   Procedure: BREAST RECONSTRUCTION;  Surgeon: Wayland Denis, DO;  Location: [facility];  Service: Plastics;  Laterality: Bilateral;  BILATERAL BREAST RECONSTRUCTION WITH TISSUE EXPANDERS AND ALLOMED   BREAST RECONSTRUCTION Right 06/03/2013   Procedure: RIGHT BREAST CAPSULE CONSTRACTURE;  Surgeon: Wayland Denis, DO;  Location: [facility];  Service: Plastics;  Laterality: Right;   CARDIOVERSION N/A 08/06/2022   Procedure: CARDIOVERSION;  Surgeon: Iran Ouch,  MD;  Location: [facility];  Service: Cardiovascular;  Laterality: N/A;   CARDIOVERSION N/A 10/02/2022   Procedure: CARDIOVERSION;  Surgeon: Yvonne Kendall, MD;  Location: [facility];  Service: Cardiovascular;  Laterality: N/A;   CARPAL TUNNEL RELEASE Bilateral 2008   EYE SURGERY Right    INJECTION KNEE Left 08/12/2018   Procedure: KNEE INJECTION-LEFT;  Surgeon: Christena Flake, MD;  Location: [facility];  Service: Orthopedics;  Laterality: Left;   JOINT REPLACEMENT     LIPOSUCTION Bilateral 01/29/2013   Procedure: LIPOSUCTION;  Surgeon: Wayland Denis, DO;  Location: [facility];  Service: Plastics;  Laterality: Bilateral;   LIPOSUCTION Right 06/03/2013   Procedure: LIPOSUCTION;  Surgeon: Wayland Denis, DO;  Location: [facility];  Service: Plastics;  Laterality: Right;   MASTECTOMY  2012   rt prophalactic mast-snbx   MASTECTOMY MODIFIED RADICAL  2012   left-axillary nodes   NEUROMA SURGERY Bilateral    NOSE SURGERY  2007   PORT-A-CATH REMOVAL     insertion and   THROAT SURGERY     TONSILLECTOMY     TOTAL KNEE ARTHROPLASTY Right 08/12/2018   Procedure: TOTAL KNEE ARTHROPLASTY-RIGHT;  Surgeon: Christena Flake, MD;  Location: [facility];  Service: Orthopedics;  Laterality: Right;   TOTAL KNEE ARTHROPLASTY Left 09/22/2019   Procedure: TOTAL KNEE ARTHROPLASTY;  Surgeon: Christena Flake, MD;  Location: [facility];  Service: Orthopedics;  Laterality: Left;   UVULOPALATOPHARYNGOPLASTY      There were no vitals filed for this visit.   Subjective Assessment - 04/15/23 1641     Subjective  They hurting today - but I know I over work them - I have built up handles on so many things    Pertinent History 02/11/23 Dr Allena Katz note: [name] is a 72 y.o. female is here today for follow up of osteoarthritis. The patient's allergies, current medications, past family history, past medical history, past social history, past surgical history and problem list were reviewed and updated as  appropriate.  She continues to have pain of the hand joints. She is taking Lyrica, Cymbalta and Plaquenil. She is not able to do things with her hands anymore. She has noticed some swelling of the hand joints. She has been offered [facility] surgery but she declined.Assessment and Plan  Osteoarthritis of multiple joints: active -- Unable to take NSAIDs due to being on blood thinners -- She states that Tramadol did not help. -- She states that Tylenol made her liver enzymes increase. -- Failed Plaquenil -- Continue Cymbalta. -- Trial of Methotrexate 10 mg weekly and Folic Acid 1 mg Daily -- Refer to OT  2. Fibromyalgia -- She is not taking gabapentin. -- Continue Lyrica 300 mg/day -- She continues to stay active.  3. Long term use of high risk medication -- Methotrexate is    Patient Stated Goals I don't want surgery - and want my pain in thumbs and fingers better so I can do things around the house, yard work, crafts, crochet, ,knit- and exercise classes    Currently in Pain? Yes    Pain Score 5     Pain Location Hand    Pain Orientation Right;Left    Pain Descriptors / Indicators Aching;Tightness    Pain Type Chronic pain    Pain Onset More than a month ago                 Pt to cont with Contrast at home to bilateral hands  Pt started doing at home  paraffin to DIP/PIP's prior to intrinsic fist - pt has paraffin at home  To decrease stiffness prior to composite fist     1 x day after contrast -AROM tendon glides - thumb PA and RA  Opposition pick up 2 cm foam block -use R CMC neoprene splint as needed  Again reinforce to focus on painfree  lumbrical fist , intrinsic fist - after parafin to DIP/PIP's prior to fisting  TO foam roller - and if pain free- can do to high lighter   Pain to stay less tha n2/10  Assess if doing better and less pain this date and less tenderness after last time Ionto session Still tender and  less triggering in session      Review again and reinforce AE and  modifications to ADL's and IADL's Pt still over using her hands- pulled weed yesterday  But did get to the water exercises class    Internet resources -Pt to  make list of tasks that she cannot adapt and OT can help modify                    Pt ed on ionto and skin check done prior and afterward -  tolerate well - patch to be removed in hour                OT Treatments/Exercises (OP) - 04/15/23 0001       Iontophoresis   Type of Iontophoresis Dexamethasone    Location R 3r and 4th and L 2nd /3rd  A1pulley's    Dose med patch , 2.0 current    Time 19      RUE Paraffin   Number Minutes Paraffin 8 Minutes    RUE Paraffin Location --   DIP's and PIP's   Comments decrease stiffness and pian      LUE Paraffin   Number Minutes Paraffin 8 Minutes    LUE Paraffin Location --   DIP's/PIP/s   Comments decrease stiffness and pain                    OT Education - 04/15/23 1645     Education Details Ionto use - progress and changes to HEP    Person(s) Educated Patient    Methods Explanation;Demonstration;Tactile cues;Verbal cues;Handout    Comprehension Verbal cues required;Returned demonstration;Verbalized understanding                 OT Long Term Goals - 04/04/23 1739       OT LONG TERM GOAL #1   Title Pt to be independent in HEP to decrease pain to less than 5/10 while maintain AROM in bilateral hands    Baseline AROM in digits WNL - thumb decrease opposition and PA and RA - pain 10/10 R hand , L 8/10 - tenderness over several A1pulley's    Time 4    Period Weeks    Status On-going    Target Date 05/02/23      OT LONG TERM GOAL #2   Title Pt to verbalize 3 joint protection and AE to decrease pain and increase ease of use of hands with less pain    Baseline no knowledge of AE or joint protection - 10/10 pain R hand , L 8/10- NOW done some changes to pain same    Time 4    Period Weeks    Status On-going    Target Date 05/02/23      OT  LONG TERM GOAL #3   Title Pt show AROM WFL in bilateral hands with digits -  pain less than 4/10 during ADL's    Baseline R hand pain 10/10 , L 8/10 and  AROM WFL except thumb opposition, PA and RA    Time 4    Period Weeks    Status On-going    Target Date 05/02/23      OT LONG TERM GOAL #4   Title Pt to impliment lifestyle changes to increase time for self to do exercise and family to be more independent    Baseline Use to do aquitics exercises in water 6 months ago- help dad in everything now bathing, eating, dressing - more than 75 % - help husband - dad has aid 20 hrs week - carry and unpack self groceries - and prep and cook NOW dad in ass living , and starting soon water exercise clasess    Time 4    Period Weeks    Status On-going    Target Date 05/02/23                   Plan - 04/15/23 1646     Clinical Impression Statement Pt refer with  pain in bilateral hands - OA and changes to bilateral hands , all digits and joints - pt report triggering in am mostly but some during day in R 3rd and 4th and L 3rd thru 5th - tenderness over A1pulley's - pt AROM able to touch palm and grip and prehension for her age [facility] -but pain and edema increase greatly to 10/10 at times. Pt this date with less pain at rest - moslty increase with composite fist - and L hand worse than R - REinforce with pt to work composite fist in pain free range - keeping pain under 2/10 - if tasks cause increase pain -and cant adapt - keep and list and bring it in for OT and her to see if can modify - pt doing very well adapting - enlarging her grip, modifying activities-  Tenderness over A1pulley less today and less digits  - Done this date 3rd  Ionto to R 3rd and 4th  and L 2nd and 3rd - A1pulley- no triggering in session today.  Pt can benefit from skilled OT services to decrease pain , edema- ed on modifcations and AE to decrease pain and increase ease of use of bilateral hands in ADL's and IADL's - pt made some  changes to lifestyle the last 1-2 months since seen last time    OT Occupational Profile and History Problem Focused Assessment - Including review of records relating to presenting problem    Occupational performance deficits (Please refer to evaluation for details): ADL's;IADL's;Leisure;Social Participation;Play;Rest and Sleep    Body Structure / Function / Physical Skills ADL;Coordination;IADL;Pain;Strength;Edema;FMC;Decreased knowledge of use of DME;Flexibility;ROM;UE functional use    Rehab Potential Fair    Clinical Decision Making Several treatment options, min-mod task modification necessary    Modification or Assistance to Complete Evaluation  No modification of tasks or assist necessary to complete eval    OT Frequency 2x / week    OT Duration 4 weeks    OT Treatment/Interventions Self-care/ADL training;Therapeutic exercise;Patient/family education;Splinting;Fluidtherapy;Contrast Bath;Ultrasound;DME and/or AE instruction;Manual Therapy;Passive range of motion;Paraffin;Iontophoresis    Consulted and Agree with Plan of Care Patient             Patient will benefit from skilled therapeutic intervention in order to improve the following deficits and impairments:   Body Structure / Function / Physical Skills: ADL, Coordination, IADL, Pain, Strength, Edema, FMC, Decreased knowledge of use of DME, Flexibility, ROM, UE functional use       Visit Diagnosis: Joint pain in both hands  Other lack of coordination  Muscle weakness (generalized)    Problem List Patient Active Problem List   Diagnosis Date Noted   Raynaud's disease 11/05/2022   Osteoporosis, post-menopausal 11/05/2022   Atrial flutter (HCC) 10/02/2022   First degree AV block 09/11/2022   Arthritis 08/08/2022   Persistent atrial fibrillation (HCC) 08/06/2022   Depression 02/27/2022   Gastroesophageal reflux disease 02/27/2022   Hypertension 02/27/2022   Abnormal LFTs 02/27/2022   Alcohol abuse 02/27/2022    Atrial fibrillation with RVR (HCC) 02/27/2022   Morbid obesity (HCC) 02/27/2022   Pes anserinus bursitis of left knee 02/08/2020   Status post total knee replacement using cement, left 09/22/2019   Status post total knee replacement using cement, right 08/12/2018   Rotator cuff rupture    Status post bilateral breast reconstruction 02/06/2013   Acquired absence of bilateral breasts and nipples 09/17/2012   Breast cancer of lower-outer quadrant of left female breast (HCC) 01/16/2011    Ichiro Chesnut, Marisue Humble, OTR/L,CLT  04/15/2023, 4:50 PM  [facility] [address] [place], [address] Phone: [phone]   Fax:  [phone]  Name: TAMAKI WILKING MRN: [id number] Date of Birth: 03-28-1951

## 2023-04-15 NOTE — Assessment & Plan Note (Addendum)
Krystal [name] is here today for follow-up of her history of left-sided breast cancer that dates back to the year 2012.  She is status post bilateral mastectomies, adjuvant chemotherapy, and antiestrogen therapy.  Krystal [name] has no clinical or radiographic sign of breast cancer recurrence. She will continue with annual breast exams here in long term survivorship.  She will f/u with Dr. Ulice Bold about her breast implants after her cardiology issues are sorted out.    I placed a referral to PT for evaluation and treatment of her balance changes secondary to her peripheral neuropathy.    She will continue to f/u with rheumatology and cardiology about her arthralgias and atrial fibrillation.   We will see Krystal [name] back in 1 year for continued long-term f/u.

## 2023-04-15 NOTE — Progress Notes (Signed)
[facility] Cancer Follow up:    Krystal Mina, MD [address] [place] [address]   DIAGNOSIS:  Cancer Staging  Breast cancer of lower-outer quadrant of left female breast [facility]) Staging form: Breast, AJCC 6th Edition - Clinical: Stage Unknown (TX, N1, MX) - Signed by Victorino December, MD on 08/31/2013 Histopathologic type: Infiltrating duct carcinoma, NOS Laterality: Left - Pathologic: No stage assigned - Unsigned Histopathologic type: Infiltrating duct carcinoma, NOS Laterality: Left   SUMMARY OF ONCOLOGIC HISTORY: Oncology History  Breast cancer of lower-outer quadrant of left female breast (HCC)  12/28/2010 Surgery   Bilateral mastectomies: Left: no residual cancer, 1/15 LN positive; Right: benign, 0/3 LN   01/29/2011 - 07/02/2011 Chemotherapy   adjuvant chemotherapy following NSABP B. 49 clinical trial. She received 4 cycles of dose dense Adriamycin and Cytoxan followed by weekly single agent Taxol    07/31/2011 - 07/2021 Anti-estrogen oral therapy   tamoxifen 10 mg daily daily. A total of 10 years therapy is planned based on BCI testing inability to be performed to to insufficient tissue. She has also been on Aromasin and Arimidex in the past, and was unable to tolerate these      CURRENT THERAPY: observation  INTERVAL HISTORY: Krystal [name] 72 y.o. female returns for follow-up of her history of breast cancer.  She has had significant issues with arthralgias this year and is seeing rheumatology regularly.  She also has cardiac issues and has been scheduled for an ablation.  She is concerned because this ablation has been postponed a couple of times due to anesthesiology staffing and really hopes that it will happen as scheduled next month.  She notes that her right breast implant has shifted some outward but otherwise has no breast concerns today.  Due to her arthritis and previous chemotherapy-induced peripheral neuropathy she is  struggling with balance somewhat and is requesting a physical therapy referral.  She is seeing occupational therapy for the neuropathy and arthralgias in her hands.   Patient Active Problem List   Diagnosis Date Noted   Breast cancer of lower-outer quadrant of left female breast (HCC) 01/16/2011    Priority: High   Raynaud's disease 11/05/2022   Osteoporosis, post-menopausal 11/05/2022   Atrial flutter (HCC) 10/02/2022   First degree AV block 09/11/2022   Arthritis 08/08/2022   Persistent atrial fibrillation (HCC) 08/06/2022   Depression 02/27/2022   Gastroesophageal reflux disease 02/27/2022   Hypertension 02/27/2022   Abnormal LFTs 02/27/2022   Alcohol abuse 02/27/2022   Atrial fibrillation with RVR (HCC) 02/27/2022   Morbid obesity (HCC) 02/27/2022   Pes anserinus bursitis of left knee 02/08/2020   Status post total knee replacement using cement, left 09/22/2019   Status post total knee replacement using cement, right 08/12/2018   Rotator cuff rupture    Status post bilateral breast reconstruction 02/06/2013   Acquired absence of bilateral breasts and nipples 09/17/2012    is allergic to cephalexin and levaquin [levofloxacin].  MEDICAL HISTORY: Past Medical History:  Diagnosis Date   Anemia    Arthritis    osteo   Blindness of right eye at birth   Cancer [facility]) 2011   Left breast 2011 and cervical age 33   Carpal tunnel syndrome    Cataract    Depression    Edema 07/27/2011   Fibromyalgia    muscle weakness and pain   GERD (gastroesophageal reflux disease)    Hyperlipidemia    Hypertension  benign   Lymphedema    Osteoporosis    Plantar fasciitis    Pre-diabetes    Raynaud's disease    Restless leg syndrome    Rosacea    Rotator cuff rupture    Sleep apnea    Synovitis    Ulcer     SURGICAL HISTORY: Past Surgical History:  Procedure Laterality Date   ABDOMINAL HYSTERECTOMY  1986   For bleeding and pain   BLADDER SURGERY  2006   Bladder tack    BREAST RECONSTRUCTION Bilateral 09/17/2012   Procedure: BREAST RECONSTRUCTION;  Surgeon: Wayland Denis, DO;  Location: [facility];  Service: Plastics;  Laterality: Bilateral;  BILATERAL BREAST RECONSTRUCTION WITH TISSUE EXPANDERS AND ALLOMED   BREAST RECONSTRUCTION Right 06/03/2013   Procedure: RIGHT BREAST CAPSULE CONSTRACTURE;  Surgeon: Wayland Denis, DO;  Location: [facility];  Service: Plastics;  Laterality: Right;   CARDIOVERSION N/A 08/06/2022   Procedure: CARDIOVERSION;  Surgeon: Iran Ouch, MD;  Location: [facility];  Service: Cardiovascular;  Laterality: N/A;   CARDIOVERSION N/A 10/02/2022   Procedure: CARDIOVERSION;  Surgeon: Yvonne Kendall, MD;  Location: [facility];  Service: Cardiovascular;  Laterality: N/A;   CARPAL TUNNEL RELEASE Bilateral 2008   EYE SURGERY Right    INJECTION KNEE Left 08/12/2018   Procedure: KNEE INJECTION-LEFT;  Surgeon: Christena Flake, MD;  Location: [facility];  Service: Orthopedics;  Laterality: Left;   JOINT REPLACEMENT     LIPOSUCTION Bilateral 01/29/2013   Procedure: LIPOSUCTION;  Surgeon: Wayland Denis, DO;  Location: [facility];  Service: Plastics;  Laterality: Bilateral;   LIPOSUCTION Right 06/03/2013   Procedure: LIPOSUCTION;  Surgeon: Wayland Denis, DO;  Location: [facility];  Service: Plastics;  Laterality: Right;   MASTECTOMY  2012   rt prophalactic mast-snbx   MASTECTOMY MODIFIED RADICAL  2012   left-axillary nodes   NEUROMA SURGERY Bilateral    NOSE SURGERY  2007   PORT-A-CATH REMOVAL     insertion and   THROAT SURGERY     TONSILLECTOMY     TOTAL KNEE ARTHROPLASTY Right 08/12/2018   Procedure: TOTAL KNEE ARTHROPLASTY-RIGHT;  Surgeon: Christena Flake, MD;  Location: [facility];  Service: Orthopedics;  Laterality: Right;   TOTAL KNEE ARTHROPLASTY Left 09/22/2019   Procedure: TOTAL KNEE ARTHROPLASTY;  Surgeon: Christena Flake, MD;  Location: [facility];  Service: Orthopedics;  Laterality: Left;    UVULOPALATOPHARYNGOPLASTY      SOCIAL HISTORY: Social History   Socioeconomic History   Marital status: Married    Spouse name: Not on file   Number of children: Not on file   Years of education: Not on file   Highest education level: Not on file  Occupational History   Not on file  Tobacco Use   Smoking status: Never    Passive exposure: Never   Smokeless tobacco: Never  Vaping Use   Vaping status: Never Used  Substance and Sexual Activity   Alcohol use: Not Currently   Drug use: No   Sexual activity: Yes  Other Topics Concern   Not on file  Social History Narrative   Not on file   Social Determinants of Health   Financial Resource Strain: Not on file  Food Insecurity: Not on file  Transportation Needs: Not on file  Physical Activity: Not on file  Stress: Not on file  Social Connections: Not on file  Intimate Partner Violence: Not on file    FAMILY HISTORY: Family History  Problem Relation Age of Onset   Cancer Mother 52       non hodgkins lymphoma   Cancer Father        neck, throat, lung   Lung cancer Father    Throat cancer Father    Hypertension Sister    Cancer Sister 33       Breast cancer dx'd 04/2011   Hypertension Brother    Breast cancer Maternal Aunt    Liver cancer Maternal Uncle    Cancer Maternal Grandmother        died 6, unknown cancer   Cancer Maternal Grandfather        died in his 79s; unknown cancer   Lung cancer Maternal Uncle    Throat cancer Maternal Uncle    Breast cancer Maternal Aunt    Cancer Cousin        died under the age 70, unknown cancer   Cancer Cousin        died age 42; unknown cancer    Review of Systems  Constitutional:  Positive for fatigue. Negative for appetite change, chills, fever and unexpected weight change.  HENT:   Negative for hearing loss, lump/mass and trouble swallowing.   Eyes:  Negative for eye problems and icterus.  Respiratory:  Negative for chest tightness, cough and shortness of  breath.   Cardiovascular:  Positive for leg swelling (chronic swelling). Negative for chest pain and palpitations.  Gastrointestinal:  Negative for abdominal distention, abdominal pain, constipation, diarrhea, nausea and vomiting.  Endocrine: Negative for hot flashes.  Genitourinary:  Negative for difficulty urinating.   Musculoskeletal:  Negative for arthralgias.  Skin:  Negative for itching and rash.  Neurological:  Negative for dizziness, extremity weakness, headaches and numbness.  Hematological:  Negative for adenopathy. Does not bruise/bleed easily.  Psychiatric/Behavioral:  Negative for depression. The patient is not nervous/anxious.       PHYSICAL EXAMINATION    Vitals:   04/15/23 1040  BP: (!) 149/82  Pulse: (!) 117  Resp: 18  Temp: 97.9 F (36.6 C)  SpO2: 97%    Physical Exam Constitutional:      General: She is not in acute distress.    Appearance: Normal appearance. She is not toxic-appearing.  HENT:     Head: Normocephalic and atraumatic.     Mouth/Throat:     Mouth: Mucous membranes are moist.     Pharynx: Oropharynx is clear. No oropharyngeal exudate or posterior oropharyngeal erythema.  Eyes:     General: No scleral icterus. Cardiovascular:     Rate and Rhythm: Normal rate and regular rhythm.     Pulses: Normal pulses.     Heart sounds: Normal heart sounds.  Pulmonary:     Effort: Pulmonary effort is normal.     Breath sounds: Normal breath sounds.  Chest:     Comments: S/p bilateral mastectomies and reconstruction, no sign of local recurrence, right breast implant is shifted to the right Abdominal:     General: Abdomen is flat. Bowel sounds are normal. There is no distension.     Palpations: Abdomen is soft.     Tenderness: There is no abdominal tenderness.  Musculoskeletal:        General: No swelling.     Cervical back: Neck supple.  Lymphadenopathy:     Cervical: No cervical adenopathy.  Skin:    General: Skin is warm and dry.      Findings: No rash.  Neurological:     General: No focal  deficit present.     Mental Status: She is alert.  Psychiatric:        Mood and Affect: Mood normal.        Behavior: Behavior normal.     LABORATORY DATA:  None today   ASSESSMENT and THERAPY PLAN:   Breast cancer of lower-outer quadrant of left female breast (HCC) Krystal [name] is here today for follow-up of her history of left-sided breast cancer that dates back to the year 2012.  She is status post bilateral mastectomies, adjuvant chemotherapy, and antiestrogen therapy.  Krystal [name] has no clinical or radiographic sign of breast cancer recurrence. She will continue with annual breast exams here in long term survivorship.  She will f/u with Dr. Ulice Bold about her breast implants after her cardiology issues are sorted out.    I placed a referral to PT for evaluation and treatment of her balance changes secondary to her peripheral neuropathy.    She will continue to f/u with rheumatology and cardiology about her arthralgias and atrial fibrillation.   We will see Krystal [name] back in 1 year for continued long-term f/u.    All questions were answered. The patient knows to call the clinic with any problems, questions or concerns. We can certainly see the patient much sooner if necessary.  Total encounter time:45 minutes*in face-to-face visit time, chart review, lab review, care coordination, order entry, and documentation of the encounter time.  Lillard Anes, NP 04/15/23 12:49 PM Medical Oncology and Hematology [facility] [address] [place], [place] [address] Tel. [phone]    Fax. [phone]  *Total Encounter Time as defined by the Centers for Medicare and Medicaid Services includes, in addition to the face-to-face time of a patient visit (documented in the note above) non-face-to-face time: obtaining and reviewing outside history, ordering and reviewing medications, tests or procedures, care coordination  (communications with other health care professionals or caregivers) and documentation in the medical record.

## 2023-04-15 NOTE — Telephone Encounter (Signed)
Patient is aware of scheduled appointment time/date

## 2023-04-18 ENCOUNTER — Ambulatory Visit: Payer: Medicare HMO | Admitting: Occupational Therapy

## 2023-04-18 DIAGNOSIS — R278 Other lack of coordination: Secondary | ICD-10-CM

## 2023-04-18 DIAGNOSIS — M25542 Pain in joints of left hand: Secondary | ICD-10-CM | POA: Diagnosis not present

## 2023-04-18 DIAGNOSIS — M6281 Muscle weakness (generalized): Secondary | ICD-10-CM | POA: Diagnosis not present

## 2023-04-18 DIAGNOSIS — M25541 Pain in joints of right hand: Secondary | ICD-10-CM | POA: Diagnosis not present

## 2023-04-18 NOTE — Therapy (Signed)
Hypertension 02/27/2022   Abnormal LFTs 02/27/2022   Alcohol abuse 02/27/2022   Atrial fibrillation with RVR (HCC) 02/27/2022   Morbid obesity (HCC) 02/27/2022   Pes anserinus bursitis of left knee 02/08/2020   Status post total knee replacement using cement, left 09/22/2019   Status post total knee replacement using cement, right 08/12/2018   Rotator cuff rupture    Status post bilateral breast reconstruction 02/06/2013   Acquired absence of bilateral breasts and nipples 09/17/2012   Breast cancer of lower-outer quadrant of left female breast (HCC) 01/16/2011    Krystal [name], OTR/L,CLT 04/18/2023, 10:02 AM  [name] [address] [place], [address] Phone: [phone]   Fax:  [phone]  Name: Krystal [name] MRN: [id number] Date of Birth: 05/01/1951  [facility] [address] [place], [place], [address] Phone: [phone]   Fax:  [phone]  Occupational Therapy Treatment  Patient Details  Name: Krystal [name] MRN: [id number] Date of Birth: 1950-10-08 Referring Provider (OT): DR Allena Katz   Encounter Date: 04/18/2023   OT End of Session - 04/18/23 0955     Visit Number 6    Number of Visits 12    Date for OT Re-Evaluation 05/16/23    OT Start Time 0901    OT Stop Time 0953    OT Time Calculation (min) 52 min    Activity Tolerance Patient tolerated treatment well    Behavior During Therapy [facility] for tasks assessed/performed             Past Medical History:  Diagnosis Date   Anemia    Arthritis    osteo   Blindness of right eye at birth   Cancer [facility]) 2011   Left breast 2011 and cervical age 60   Carpal tunnel syndrome    Cataract    Depression    Edema 07/27/2011   Fibromyalgia    muscle weakness and pain   GERD (gastroesophageal reflux disease)    Hyperlipidemia    Hypertension    benign   Lymphedema    Osteoporosis    Plantar fasciitis    Pre-diabetes    Raynaud's disease    Restless leg syndrome    Rosacea    Rotator cuff rupture    Sleep apnea    Synovitis    Ulcer     Past Surgical History:  Procedure Laterality Date   ABDOMINAL HYSTERECTOMY  1986   For bleeding and pain   BLADDER SURGERY  2006   Bladder tack   BREAST RECONSTRUCTION Bilateral 09/17/2012   Procedure: BREAST RECONSTRUCTION;  Surgeon: Wayland Denis, DO;  Location: [facility];  Service: Plastics;  Laterality: Bilateral;  BILATERAL BREAST RECONSTRUCTION WITH TISSUE EXPANDERS AND ALLOMED   BREAST RECONSTRUCTION Right 06/03/2013   Procedure: RIGHT BREAST CAPSULE CONSTRACTURE;  Surgeon: Wayland Denis, DO;  Location: [facility];  Service: Plastics;  Laterality: Right;   CARDIOVERSION N/A 08/06/2022   Procedure: CARDIOVERSION;  Surgeon: Iran Ouch,  MD;  Location: [facility];  Service: Cardiovascular;  Laterality: N/A;   CARDIOVERSION N/A 10/02/2022   Procedure: CARDIOVERSION;  Surgeon: Yvonne Kendall, MD;  Location: [facility];  Service: Cardiovascular;  Laterality: N/A;   CARPAL TUNNEL RELEASE Bilateral 2008   EYE SURGERY Right    INJECTION KNEE Left 08/12/2018   Procedure: KNEE INJECTION-LEFT;  Surgeon: Christena Flake, MD;  Location: [facility];  Service: Orthopedics;  Laterality: Left;   JOINT REPLACEMENT     LIPOSUCTION Bilateral 01/29/2013   Procedure: LIPOSUCTION;  Surgeon: Wayland Denis, DO;  Location: [facility];  Service: Plastics;  Laterality: Bilateral;   LIPOSUCTION Right 06/03/2013   Procedure: LIPOSUCTION;  Surgeon: Wayland Denis, DO;  [facility];  Service: Plastics;  Laterality: Right;   MASTECTOMY  2012   rt prophalactic mast-snbx   MASTECTOMY MODIFIED RADICAL  2012   left-axillary nodes   NEUROMA SURGERY Bilateral    NOSE SURGERY  2007   PORT-A-CATH REMOVAL     insertion and   THROAT SURGERY     TONSILLECTOMY     TOTAL KNEE ARTHROPLASTY Right 08/12/2018   Procedure: TOTAL KNEE ARTHROPLASTY-RIGHT;  Surgeon: Christena Flake, MD;  Location: [facility];  Service: Orthopedics;  Hypertension 02/27/2022   Abnormal LFTs 02/27/2022   Alcohol abuse 02/27/2022   Atrial fibrillation with RVR (HCC) 02/27/2022   Morbid obesity (HCC) 02/27/2022   Pes anserinus bursitis of left knee 02/08/2020   Status post total knee replacement using cement, left 09/22/2019   Status post total knee replacement using cement, right 08/12/2018   Rotator cuff rupture    Status post bilateral breast reconstruction 02/06/2013   Acquired absence of bilateral breasts and nipples 09/17/2012   Breast cancer of lower-outer quadrant of left female breast (HCC) 01/16/2011    Krystal [name], OTR/L,CLT 04/18/2023, 10:02 AM  [name] [address] [place], [address] Phone: [phone]   Fax:  [phone]  Name: Krystal [name] MRN: [id number] Date of Birth: 05/01/1951  Hypertension 02/27/2022   Abnormal LFTs 02/27/2022   Alcohol abuse 02/27/2022   Atrial fibrillation with RVR (HCC) 02/27/2022   Morbid obesity (HCC) 02/27/2022   Pes anserinus bursitis of left knee 02/08/2020   Status post total knee replacement using cement, left 09/22/2019   Status post total knee replacement using cement, right 08/12/2018   Rotator cuff rupture    Status post bilateral breast reconstruction 02/06/2013   Acquired absence of bilateral breasts and nipples 09/17/2012   Breast cancer of lower-outer quadrant of left female breast (HCC) 01/16/2011    Krystal [name], OTR/L,CLT 04/18/2023, 10:02 AM  [name] [address] [place], [address] Phone: [phone]   Fax:  [phone]  Name: Krystal [name] MRN: [id number] Date of Birth: 05/01/1951  [facility] [address] [place], [place], [address] Phone: [phone]   Fax:  [phone]  Occupational Therapy Treatment  Patient Details  Name: Krystal [name] MRN: [id number] Date of Birth: 1950-10-08 Referring Provider (OT): DR Allena Katz   Encounter Date: 04/18/2023   OT End of Session - 04/18/23 0955     Visit Number 6    Number of Visits 12    Date for OT Re-Evaluation 05/16/23    OT Start Time 0901    OT Stop Time 0953    OT Time Calculation (min) 52 min    Activity Tolerance Patient tolerated treatment well    Behavior During Therapy [facility] for tasks assessed/performed             Past Medical History:  Diagnosis Date   Anemia    Arthritis    osteo   Blindness of right eye at birth   Cancer [facility]) 2011   Left breast 2011 and cervical age 60   Carpal tunnel syndrome    Cataract    Depression    Edema 07/27/2011   Fibromyalgia    muscle weakness and pain   GERD (gastroesophageal reflux disease)    Hyperlipidemia    Hypertension    benign   Lymphedema    Osteoporosis    Plantar fasciitis    Pre-diabetes    Raynaud's disease    Restless leg syndrome    Rosacea    Rotator cuff rupture    Sleep apnea    Synovitis    Ulcer     Past Surgical History:  Procedure Laterality Date   ABDOMINAL HYSTERECTOMY  1986   For bleeding and pain   BLADDER SURGERY  2006   Bladder tack   BREAST RECONSTRUCTION Bilateral 09/17/2012   Procedure: BREAST RECONSTRUCTION;  Surgeon: Wayland Denis, DO;  Location: [facility];  Service: Plastics;  Laterality: Bilateral;  BILATERAL BREAST RECONSTRUCTION WITH TISSUE EXPANDERS AND ALLOMED   BREAST RECONSTRUCTION Right 06/03/2013   Procedure: RIGHT BREAST CAPSULE CONSTRACTURE;  Surgeon: Wayland Denis, DO;  Location: [facility];  Service: Plastics;  Laterality: Right;   CARDIOVERSION N/A 08/06/2022   Procedure: CARDIOVERSION;  Surgeon: Iran Ouch,  MD;  Location: [facility];  Service: Cardiovascular;  Laterality: N/A;   CARDIOVERSION N/A 10/02/2022   Procedure: CARDIOVERSION;  Surgeon: Yvonne Kendall, MD;  Location: [facility];  Service: Cardiovascular;  Laterality: N/A;   CARPAL TUNNEL RELEASE Bilateral 2008   EYE SURGERY Right    INJECTION KNEE Left 08/12/2018   Procedure: KNEE INJECTION-LEFT;  Surgeon: Christena Flake, MD;  Location: [facility];  Service: Orthopedics;  Laterality: Left;   JOINT REPLACEMENT     LIPOSUCTION Bilateral 01/29/2013   Procedure: LIPOSUCTION;  Surgeon: Wayland Denis, DO;  Location: [facility];  Service: Plastics;  Laterality: Bilateral;   LIPOSUCTION Right 06/03/2013   Procedure: LIPOSUCTION;  Surgeon: Wayland Denis, DO;  Location:  SUR[facility]ERY C[facility];  Service: Plastics;  Laterality: Right;   MASTECTOMY  2012   rt prophalactic mast-snbx   MASTECTOMY MODIFIED RADICAL  2012   left-axillary nodes   NEUROMA SURGERY Bilateral    NOSE SURGERY  2007   PORT-A-CATH REMOVAL     insertion and   THROAT SURGERY     TONSILLECTOMY     TOTAL KNEE ARTHROPLASTY Right 08/12/2018   Procedure: TOTAL KNEE ARTHROPLASTY-RIGHT;  Surgeon: Christena Flake, MD;  Location: [facility];  Service: Orthopedics;

## 2023-04-22 ENCOUNTER — Ambulatory Visit: Payer: Medicare HMO | Admitting: Occupational Therapy

## 2023-04-23 ENCOUNTER — Encounter: Payer: Medicare HMO | Admitting: Occupational Therapy

## 2023-04-25 ENCOUNTER — Encounter: Payer: Medicare HMO | Admitting: Occupational Therapy

## 2023-05-03 ENCOUNTER — Ambulatory Visit: Payer: Medicare HMO | Admitting: Occupational Therapy

## 2023-05-07 ENCOUNTER — Telehealth (HOSPITAL_COMMUNITY): Payer: Self-pay | Admitting: *Deleted

## 2023-05-07 NOTE — Telephone Encounter (Signed)
Attempted to call patient regarding upcoming cardiac CT appointment. °Left message on voicemail with name and callback number ° °Edie Vallandingham RN Navigator Cardiac Imaging °[facility] °[phone] Office °[phone] Cell ° °

## 2023-05-08 ENCOUNTER — Ambulatory Visit
Admission: RE | Admit: 2023-05-08 | Discharge: 2023-05-08 | Disposition: A | Discharge status: Home / Self Care | Payer: Medicare HMO | Source: Ambulatory Visit | Attending: Cardiology | Admitting: Cardiology

## 2023-05-08 ENCOUNTER — Other Ambulatory Visit
Admission: RE | Admit: 2023-05-08 | Discharge: 2023-05-08 | Disposition: A | Discharge status: Home / Self Care | Payer: Medicare HMO | Source: Ambulatory Visit | Attending: Cardiology | Admitting: Cardiology

## 2023-05-08 DIAGNOSIS — I4892 Unspecified atrial flutter: Secondary | ICD-10-CM

## 2023-05-08 DIAGNOSIS — Z79899 Other long term (current) drug therapy: Secondary | ICD-10-CM | POA: Insufficient documentation

## 2023-05-08 DIAGNOSIS — I4819 Other persistent atrial fibrillation: Secondary | ICD-10-CM

## 2023-05-08 LAB — CBC WITH DIFFERENTIAL/PLATELET
Abs Immature Granulocytes: 0.02 K/uL (ref 0.00–0.07)
Basophils Absolute: 0.1 K/uL (ref 0.0–0.1)
Basophils Relative: 1 %
Eosinophils Absolute: 0.2 K/uL (ref 0.0–0.5)
Eosinophils Relative: 4 %
HCT: 43.8 % (ref 36.0–46.0)
Hemoglobin: 14.5 g/dL (ref 12.0–15.0)
Immature Granulocytes: 0 %
Lymphocytes Relative: 25 %
Lymphs Abs: 1.4 K/uL (ref 0.7–4.0)
MCH: 29.5 pg (ref 26.0–34.0)
MCHC: 33.1 g/dL (ref 30.0–36.0)
MCV: 89.2 fL (ref 80.0–100.0)
Monocytes Absolute: 0.7 K/uL (ref 0.1–1.0)
Monocytes Relative: 12 %
Neutro Abs: 3.1 K/uL (ref 1.7–7.7)
Neutrophils Relative %: 58 %
Platelets: 256 K/uL (ref 150–400)
RBC: 4.91 MIL/uL (ref 3.87–5.11)
RDW: 13.9 % (ref 11.5–15.5)
WBC: 5.5 K/uL (ref 4.0–10.5)
nRBC: 0 % (ref 0.0–0.2)

## 2023-05-08 LAB — BASIC METABOLIC PANEL WITH GFR
Anion gap: 8 (ref 5–15)
BUN: 15 mg/dL (ref 8–23)
CO2: 27 mmol/L (ref 22–32)
Calcium: 8.6 mg/dL — ABNORMAL LOW (ref 8.9–10.3)
Chloride: 101 mmol/L (ref 98–111)
Creatinine, Ser: 0.95 mg/dL (ref 0.44–1.00)
GFR, Estimated: >60 mL/min
Glucose, Bld: 87 mg/dL (ref 70–99)
Potassium: 3.4 mmol/L — ABNORMAL LOW (ref 3.5–5.1)
Sodium: 136 mmol/L (ref 135–145)

## 2023-05-08 MED ORDER — IOHEXOL 350 MG/ML SOLN
80.0000 mL | Freq: Once | INTRAVENOUS | Status: AC | PRN
  Administered 2023-05-08: 80 mL via INTRAVENOUS

## 2023-05-09 ENCOUNTER — Ambulatory Visit (HOSPITAL_COMMUNITY): Payer: Medicare HMO | Admitting: Internal Medicine

## 2023-05-13 ENCOUNTER — Ambulatory Visit: Payer: Medicare HMO | Attending: Rheumatology | Admitting: Occupational Therapy

## 2023-05-13 ENCOUNTER — Telehealth: Payer: Self-pay

## 2023-05-13 MED ORDER — POTASSIUM CHLORIDE CRYS ER 20 MEQ PO TBCR: 20.0000 meq | EXTENDED_RELEASE_TABLET | Freq: Two times a day (BID) | ORAL | 3 refills | Status: DC

## 2023-05-13 NOTE — Telephone Encounter (Signed)
-----   Message from Rossie [facility] sent at 05/13/2023  1:19 PM EDT ----- Loney Laurence reviewed. Potassium low.  Carly, can you have her increase her potassium to twice daily (currently takes twice daily).  Thanks,  Sheria Lang T. Lalla Brothers, MD, [facility], [facility] Cardiac Electrophysiology

## 2023-05-13 NOTE — Pre-Procedure Instructions (Signed)
Instructed patient on the following items: Arrival time 1100 Nothing to eat or drink after midnight No meds AM of procedure Responsible person to drive you home and stay with you for 24 hrs  Have you missed any doses of anti-coagulant Eliquis- takes twice a day, hasn't missed any doses.  Don't take dose in the morning.

## 2023-05-13 NOTE — Telephone Encounter (Signed)
The patient has been notified of the result and verbalized understanding.  All questions (if any) were answered. Frutoso Schatz, RN 05/13/2023 2:55 PM

## 2023-05-14 ENCOUNTER — Other Ambulatory Visit: Payer: Self-pay

## 2023-05-14 ENCOUNTER — Ambulatory Visit (HOSPITAL_COMMUNITY): Payer: Self-pay | Admitting: Anesthesiology

## 2023-05-14 ENCOUNTER — Encounter (HOSPITAL_COMMUNITY): Payer: Self-pay | Admitting: Cardiology

## 2023-05-14 ENCOUNTER — Ambulatory Visit (HOSPITAL_COMMUNITY)
Admission: RE | Disposition: A | Discharge status: Home / Self Care | Payer: Medicare HMO | Source: Home / Self Care | Attending: Cardiology

## 2023-05-14 ENCOUNTER — Ambulatory Visit (HOSPITAL_COMMUNITY)
Admission: RE | Admit: 2023-05-14 | Discharge: 2023-05-14 | Disposition: A | Discharge status: Home / Self Care | Payer: Medicare HMO | Attending: Cardiology | Admitting: Cardiology

## 2023-05-14 ENCOUNTER — Ambulatory Visit (HOSPITAL_BASED_OUTPATIENT_CLINIC_OR_DEPARTMENT_OTHER): Payer: Medicare HMO | Admitting: Anesthesiology

## 2023-05-14 ENCOUNTER — Other Ambulatory Visit (HOSPITAL_COMMUNITY): Payer: Self-pay

## 2023-05-14 DIAGNOSIS — G4730 Sleep apnea, unspecified: Secondary | ICD-10-CM | POA: Insufficient documentation

## 2023-05-14 DIAGNOSIS — H5440 Blindness, one eye, unspecified eye: Secondary | ICD-10-CM | POA: Insufficient documentation

## 2023-05-14 DIAGNOSIS — I10 Essential (primary) hypertension: Secondary | ICD-10-CM | POA: Diagnosis not present

## 2023-05-14 DIAGNOSIS — I483 Typical atrial flutter: Secondary | ICD-10-CM | POA: Diagnosis not present

## 2023-05-14 DIAGNOSIS — I4819 Other persistent atrial fibrillation: Secondary | ICD-10-CM | POA: Insufficient documentation

## 2023-05-14 DIAGNOSIS — I4892 Unspecified atrial flutter: Secondary | ICD-10-CM

## 2023-05-14 DIAGNOSIS — Z79899 Other long term (current) drug therapy: Secondary | ICD-10-CM | POA: Diagnosis not present

## 2023-05-14 DIAGNOSIS — I4891 Unspecified atrial fibrillation: Secondary | ICD-10-CM | POA: Diagnosis not present

## 2023-05-14 HISTORY — PX: ATRIAL FIBRILLATION ABLATION: EP1191

## 2023-05-14 LAB — BASIC METABOLIC PANEL
Anion gap: 12 (ref 5–15)
BUN: 9 mg/dL (ref 8–23)
CO2: 24 mmol/L (ref 22–32)
Calcium: 9.1 mg/dL (ref 8.9–10.3)
Chloride: 105 mmol/L (ref 98–111)
Creatinine, Ser: 0.84 mg/dL (ref 0.44–1.00)
GFR, Estimated: >60 mL/min (ref >60)
Glucose, Bld: 103 mg/dL — ABNORMAL HIGH (ref 70–99)
Potassium: 3.3 mmol/L — ABNORMAL LOW (ref 3.5–5.1)
Sodium: 141 mmol/L (ref 135–145)

## 2023-05-14 LAB — GLUCOSE, CAPILLARY
Glucose-Capillary: 115 mg/dL — ABNORMAL HIGH (ref 70–99)
Glucose-Capillary: 86 mg/dL (ref 70–99)

## 2023-05-14 SURGERY — ATRIAL FIBRILLATION ABLATION
Anesthesia: General

## 2023-05-14 MED ORDER — ACETAMINOPHEN 500 MG PO TABS
ORAL_TABLET | ORAL | Status: AC
  Filled 2023-05-14: qty 2

## 2023-05-14 MED ORDER — ONDANSETRON HCL 4 MG/2ML IJ SOLN
INTRAMUSCULAR | Status: DC | PRN
  Administered 2023-05-14: 4 mg via INTRAVENOUS

## 2023-05-14 MED ORDER — COLCHICINE 0.6 MG PO TABS
0.6000 mg | ORAL_TABLET | Freq: Two times a day (BID) | ORAL | 0 refills | Status: DC
  Filled 2023-05-14: qty 10, 5d supply, fill #0

## 2023-05-14 MED ORDER — FENTANYL CITRATE (PF) 250 MCG/5ML IJ SOLN
INTRAMUSCULAR | Status: DC | PRN
  Administered 2023-05-14 (×2): 50 ug via INTRAVENOUS

## 2023-05-14 MED ORDER — ROCURONIUM BROMIDE 10 MG/ML (PF) SYRINGE
PREFILLED_SYRINGE | INTRAVENOUS | Status: DC | PRN
  Administered 2023-05-14: 10 mg via INTRAVENOUS
  Administered 2023-05-14: 60 mg via INTRAVENOUS

## 2023-05-14 MED ORDER — ATROPINE SULFATE 1 MG/10ML IJ SOSY
PREFILLED_SYRINGE | INTRAMUSCULAR | Status: DC | PRN
Start: 2023-05-14 — End: 2023-05-14
  Administered 2023-05-14: .5 mg via INTRAVENOUS

## 2023-05-14 MED ORDER — HEPARIN SODIUM (PORCINE) 1000 UNIT/ML IJ SOLN
INTRAMUSCULAR | Status: DC | PRN
  Administered 2023-05-14: 13000 [IU] via INTRAVENOUS

## 2023-05-14 MED ORDER — PROTAMINE SULFATE 10 MG/ML IV SOLN
INTRAVENOUS | Status: DC | PRN
  Administered 2023-05-14: 30 mg via INTRAVENOUS

## 2023-05-14 MED ORDER — SODIUM CHLORIDE 0.9 % IV SOLN: INTRAVENOUS | Status: DC

## 2023-05-14 MED ORDER — LIDOCAINE 2% (20 MG/ML) 5 ML SYRINGE
INTRAMUSCULAR | Status: DC | PRN
  Administered 2023-05-14: 80 mg via INTRAVENOUS

## 2023-05-14 MED ORDER — ACETAMINOPHEN 325 MG PO TABS: 650.0000 mg | ORAL_TABLET | ORAL | Status: DC | PRN

## 2023-05-14 MED ORDER — HEPARIN (PORCINE) IN NACL 1000-0.9 UT/500ML-% IV SOLN
INTRAVENOUS | Status: DC | PRN
  Administered 2023-05-14 (×2): 500 mL

## 2023-05-14 MED ORDER — PHENYLEPHRINE HCL-NACL 20-0.9 MG/250ML-% IV SOLN
INTRAVENOUS | Status: DC | PRN
  Administered 2023-05-14: 10 ug/min via INTRAVENOUS

## 2023-05-14 MED ORDER — APIXABAN 5 MG PO TABS: 5.0000 mg | ORAL_TABLET | Freq: Two times a day (BID) | ORAL | Status: DC

## 2023-05-14 MED ORDER — COLCHICINE 0.6 MG PO TABS: 0.6000 mg | ORAL_TABLET | Freq: Two times a day (BID) | ORAL | Status: DC

## 2023-05-14 MED ORDER — PANTOPRAZOLE SODIUM 40 MG PO TBEC: 40.0000 mg | DELAYED_RELEASE_TABLET | Freq: Every day | ORAL | Status: DC

## 2023-05-14 MED ORDER — PANTOPRAZOLE SODIUM 40 MG PO TBEC
40.0000 mg | DELAYED_RELEASE_TABLET | Freq: Every day | ORAL | 0 refills | Status: DC
  Filled 2023-05-14: qty 45, 45d supply, fill #0

## 2023-05-14 MED ORDER — SODIUM CHLORIDE 0.9% FLUSH: 3.0000 mL | Freq: Two times a day (BID) | INTRAVENOUS | Status: DC

## 2023-05-14 MED ORDER — FENTANYL CITRATE (PF) 100 MCG/2ML IJ SOLN
INTRAMUSCULAR | Status: AC
  Filled 2023-05-14: qty 2

## 2023-05-14 MED ORDER — SODIUM CHLORIDE 0.9% FLUSH: 3.0000 mL | INTRAVENOUS | Status: DC | PRN

## 2023-05-14 MED ORDER — ACETAMINOPHEN 500 MG PO TABS
1000.0000 mg | ORAL_TABLET | Freq: Once | ORAL | Status: AC
  Administered 2023-05-14: 1000 mg via ORAL

## 2023-05-14 MED ORDER — SUGAMMADEX SODIUM 200 MG/2ML IV SOLN
INTRAVENOUS | Status: DC | PRN
  Administered 2023-05-14 (×2): 100 mg via INTRAVENOUS

## 2023-05-14 MED ORDER — ATROPINE SULFATE 1 MG/10ML IJ SOSY
PREFILLED_SYRINGE | INTRAMUSCULAR | Status: AC
  Filled 2023-05-14: qty 10

## 2023-05-14 MED ORDER — PROPOFOL 10 MG/ML IV BOLUS
INTRAVENOUS | Status: DC | PRN
  Administered 2023-05-14: 30 mg via INTRAVENOUS
  Administered 2023-05-14: 125 ug/kg/min via INTRAVENOUS
  Administered 2023-05-14: 140 mg via INTRAVENOUS

## 2023-05-14 MED ORDER — ONDANSETRON HCL 4 MG/2ML IJ SOLN: 4.0000 mg | Freq: Four times a day (QID) | INTRAMUSCULAR | Status: DC | PRN

## 2023-05-14 SURGICAL SUPPLY — 24 items
BAG SNAP BAND KOVER 36X36 (MISCELLANEOUS) IMPLANT
BLANKET WARM UNDERBOD FULL ACC (MISCELLANEOUS) ×2 IMPLANT
CABLE PFA RX CATH CONN (CABLE) IMPLANT
CATH FARAWAVE ABLATION 31 (CATHETERS) IMPLANT
CATH MAPPNG PENTARAY F 2-6-2MM (CATHETERS) IMPLANT
CATH SMTCH THERMOCOOL SF DF (CATHETERS) IMPLANT
CATH SOUNDSTAR ECO 8FR (CATHETERS) IMPLANT
CATH WEB BI DIR CSDF CRV REPRO (CATHETERS) IMPLANT
CLOSURE PERCLOSE PROSTYLE (VASCULAR PRODUCTS) IMPLANT
COVER SWIFTLINK CONNECTOR (BAG) ×2 IMPLANT
DILATOR VESSEL 38 20CM 16FR (INTRODUCER) IMPLANT
GUIDEWIRE INQWIRE 1.5J.035X260 (WIRE) IMPLANT
INQWIRE 1.5J .035X260CM (WIRE) ×1 IMPLANT
PACK EP LATEX FREE (CUSTOM PROCEDURE TRAY) ×1
PACK EP LF (CUSTOM PROCEDURE TRAY) ×2 IMPLANT
PAD DEFIB RADIO PHYSIO CONN (PAD) ×2 IMPLANT
PATCH CARTO3 (PAD) IMPLANT
PENTARAY F 2-6-2MM (CATHETERS) ×1 IMPLANT
SHEATH FARADRIVE STEERABLE (SHEATH) IMPLANT
SHEATH PINNACLE 8F 10CM (SHEATH) IMPLANT
SHEATH PINNACLE 9F 10CM (SHEATH) IMPLANT
SHEATH PROBE COVER 6X72 (BAG) IMPLANT
SHEATH WIRE KIT BAYLIS SL1 (KITS) IMPLANT
TUBING SMART ABLATE COOLFLOW (TUBING) IMPLANT

## 2023-05-14 NOTE — Transfer of Care (Signed)
Immediate Anesthesia Transfer of Care Note  Patient: Krystal [name]  Procedure(s) Performed: ATRIAL FIBRILLATION ABLATION  Patient Location: Endoscopy Unit  Anesthesia Type:General  Level of Consciousness: awake, alert , and oriented  Airway & Oxygen Therapy: Patient Spontanous Breathing  Post-op Assessment: Report given to RN  Post vital signs: Reviewed and stable  Last Vitals:  Vitals Value Taken Time  BP 104/66 05/14/23 1510  Temp    Pulse 64 05/14/23 1510  Resp 14 05/14/23 1510  SpO2 94 % 05/14/23 1510  Vitals shown include unfiled device data.  Last Pain:  Vitals:   05/14/23 1204  PainSc: 0-No pain      Patients Stated Pain Goal: 3 (05/14/23 1204)  Complications: There were no known notable events for this encounter.

## 2023-05-14 NOTE — Anesthesia Postprocedure Evaluation (Signed)
Anesthesia Post Note  Patient: Krystal [name]  Procedure(s) Performed: ATRIAL FIBRILLATION ABLATION     Patient location during evaluation: Cath Lab Anesthesia Type: General Level of consciousness: awake and alert Pain management: pain level controlled Vital Signs Assessment: post-procedure vital signs reviewed and stable Respiratory status: spontaneous breathing, nonlabored ventilation, respiratory function stable and patient connected to nasal cannula oxygen Cardiovascular status: blood pressure returned to baseline and stable Postop Assessment: no apparent nausea or vomiting Anesthetic complications: no   There were no known notable events for this encounter.  Last Vitals:  Vitals:   05/14/23 1530 05/14/23 1535  BP: 106/69 119/69  Pulse: 64 (!) 59  Resp: 11 11  Temp:  36.5 C  SpO2: 94% 96%    Last Pain:  Vitals:   05/14/23 1535  TempSrc: Oral  PainSc: 0-No pain                 Collene Schlichter

## 2023-05-14 NOTE — H&P (Signed)
Electrophysiology Office Follow up Visit Note:     Date:  05/14/2023    ID:  Krystal [name], DOB [birth year], MRN [id number]   PCP:  Jerl Mina, MD      [facility] Cardiologist:  Lorine Bears, MD  [facility] Electrophysiologist:  Sherryl Manges, MD      Interval History:     Krystal [name] is a 72 y.o. female who presents for a follow up visit.    She is followed by Dr. Graciela Husbands in the outpatient setting.  She last saw Dr. Graciela Husbands November 06, 2022.  I am seeing her today for an evaluation of her atrial fibrillation.  She was previously on flecainide for atrial fibrillation and this was transitioned to propafenone.  She did not tolerate the propafenone and it was also ineffective.  She also carries a diagnosis of hypertension, sleep apnea.  At the appointment with Dr. Graciela Husbands her propafenone was stopped and she was started on amiodarone as a bridge to catheter ablation.   She was seen by Christain Sacramento on Nov 30, 2022.  At that appointment she was back in sinus rhythm.   Presents for PVI today.        Objective Past medical, surgical, social and family history were reviewed.   ROS:   Please see the history of present illness.    All other systems reviewed and are negative.   EKGs/Labs/Other Studies Reviewed:     The following studies were reviewed today:   February 28, 2022 echo EF 65-70 RV normal Mild to moderate MR Mild to moderate TR   10/02/2022 ECG shows AFL     EKG:  The ekg ordered today demonstrates sinus rhythm     Physical Exam:     VS:  145/91, 81, 19, AF      Wt Readings from Last 3 Encounters:  11/30/22 174 lb 6 oz (79.1 kg)  11/06/22 174 lb (78.9 kg)  11/05/22 174 lb 4 oz (79 kg)      GEN:  Well nourished, well developed in no acute distress CARDIAC: RRR, no murmurs, rubs, gallops RESPIRATORY:  Clear to auscultation without rales, wheezing or rhonchi          Assessment ASSESSMENT:     1. Persistent atrial fibrillation (HCC)   2. Atrial  flutter, unspecified type (HCC)   3. Encounter for long-term (current) use of high-risk medication     PLAN:     In order of problems listed above:     #Persistent atrial fibrillation #High risk med monitoring-amiodarone Symptomatic.  Failed flecainide, propafenone and is now on amiodarone as a bridge to catheter ablation.  I discussed treatment options for her atrial fibrillation in detail during today's encounter including the risks of each option.   Discussed treatment options today for AF including antiarrhythmic drug therapy and ablation. Discussed risks, recovery and likelihood of success with each treatment strategy. Risk, benefits, and alternatives to EP study and radiofrequency ablation for afib were discussed. These risks include but are not limited to stroke, bleeding, vascular damage, tamponade, perforation, damage to the esophagus, lungs, phrenic nerve and other structures, pulmonary vein stenosis, worsening renal function, and death.  Discussed potential need for repeat ablation procedures and antiarrhythmic drugs after an initial ablation. The patient understands these risk and wishes to proceed.  We will therefore proceed with catheter ablation at the next available time.  Carto, ICE, anesthesia are requested for the procedure.  Will also obtain CT PV protocol prior to  the procedure to exclude LAA thrombus and further evaluate atrial anatomy.   Ablation strategy would be PVI plus CTI ablation.    Presents today for PVI. Procedure reviewed. No missed doses of OAC.         Signed, Steffanie Dunn, MD, [facility], [facility] 05/14/2023 Electrophysiology [facility]

## 2023-05-14 NOTE — Anesthesia Preprocedure Evaluation (Addendum)
Anesthesia Evaluation  Patient identified by MRN, date of birth, ID band Patient awake    Reviewed: Allergy & Precautions, NPO status , Patient's Chart, lab work & pertinent test results  Airway Mallampati: III  TM Distance: >3 FB Neck ROM: Full    Dental  (+) Teeth Intact, Dental Advisory Given, Implants,    Pulmonary sleep apnea    Pulmonary exam normal breath sounds clear to auscultation       Cardiovascular hypertension, Pt. on medications Normal cardiovascular exam+ dysrhythmias Atrial Fibrillation  Rhythm:Regular Rate:Normal     Neuro/Psych  PSYCHIATRIC DISORDERS  Depression    Blind right eye    GI/Hepatic Neg liver ROS,GERD  Medicated,,  Endo/Other  negative endocrine ROS    Renal/GU negative Renal ROS     Musculoskeletal  (+) Arthritis ,  Fibromyalgia -  Abdominal   Peds  Hematology  (+) Blood dyscrasia (Eliquis)   Anesthesia Other Findings Day of surgery medications reviewed with the patient.  H/o breast cancer   Reproductive/Obstetrics                             Anesthesia Physical Anesthesia Plan  ASA: 3  Anesthesia Plan: General   Post-op Pain Management: Tylenol PO (pre-op)*   Induction: Intravenous  PONV Risk Score and Plan: 3 and Dexamethasone and Ondansetron  Airway Management Planned: Oral ETT  Additional Equipment:   Intra-op Plan:   Post-operative Plan: Extubation in OR  Informed Consent: I have reviewed the patients History and Physical, chart, labs and discussed the procedure including the risks, benefits and alternatives for the proposed anesthesia with the patient or authorized representative who has indicated his/her understanding and acceptance.     Dental advisory given  Plan Discussed with: CRNA  Anesthesia Plan Comments:        Anesthesia Quick Evaluation

## 2023-05-14 NOTE — Progress Notes (Signed)
Patient was given discharge instructions. She verbalized understanding. 

## 2023-05-14 NOTE — Discharge Instructions (Signed)

## 2023-05-14 NOTE — Anesthesia Procedure Notes (Signed)
Procedure Name: Intubation Date/Time: 05/14/2023 1:09 PM  Performed by: Kayleen Memos, CRNAPre-anesthesia Checklist: Patient identified, Emergency Drugs available, Suction available and Patient being monitored Patient Re-evaluated:Patient Re-evaluated prior to induction Oxygen Delivery Method: Circle System Utilized Preoxygenation: Pre-oxygenation with 100% oxygen Induction Type: IV induction Ventilation: Mask ventilation without difficulty Laryngoscope Size: Mac and 3 Grade View: Grade I Tube type: Oral Tube size: 7.0 mm Number of attempts: 1 Airway Equipment and Method: Stylet Placement Confirmation: ETT inserted through vocal cords under direct vision, positive ETCO2 and breath sounds checked- equal and bilateral Secured at: 21 cm Tube secured with: Tape Dental Injury: Teeth and Oropharynx as per pre-operative assessment

## 2023-05-15 ENCOUNTER — Encounter (HOSPITAL_COMMUNITY): Payer: Self-pay | Admitting: Cardiology

## 2023-05-15 MED FILL — Fentanyl Citrate Preservative Free (PF) Inj 100 MCG/2ML: INTRAMUSCULAR | Qty: 2 | Status: AC

## 2023-05-16 LAB — POCT ACTIVATED CLOTTING TIME: Activated Clotting Time: 342 s

## 2023-06-07 ENCOUNTER — Encounter: Payer: Self-pay | Admitting: Physical Therapy

## 2023-06-07 ENCOUNTER — Ambulatory Visit: Payer: Medicare HMO | Attending: Adult Health | Admitting: Physical Therapy

## 2023-06-07 DIAGNOSIS — R278 Other lack of coordination: Secondary | ICD-10-CM | POA: Insufficient documentation

## 2023-06-07 DIAGNOSIS — M25542 Pain in joints of left hand: Secondary | ICD-10-CM | POA: Insufficient documentation

## 2023-06-07 DIAGNOSIS — M6281 Muscle weakness (generalized): Secondary | ICD-10-CM | POA: Diagnosis not present

## 2023-06-07 DIAGNOSIS — M79672 Pain in left foot: Secondary | ICD-10-CM | POA: Insufficient documentation

## 2023-06-07 DIAGNOSIS — R2689 Other abnormalities of gait and mobility: Secondary | ICD-10-CM | POA: Diagnosis not present

## 2023-06-07 DIAGNOSIS — M25541 Pain in joints of right hand: Secondary | ICD-10-CM | POA: Diagnosis not present

## 2023-06-07 DIAGNOSIS — G620 Drug-induced polyneuropathy: Secondary | ICD-10-CM | POA: Diagnosis not present

## 2023-06-07 DIAGNOSIS — M79671 Pain in right foot: Secondary | ICD-10-CM | POA: Insufficient documentation

## 2023-06-07 DIAGNOSIS — T451X5A Adverse effect of antineoplastic and immunosuppressive drugs, initial encounter: Secondary | ICD-10-CM | POA: Diagnosis not present

## 2023-06-07 DIAGNOSIS — R262 Difficulty in walking, not elsewhere classified: Secondary | ICD-10-CM | POA: Diagnosis not present

## 2023-06-07 NOTE — Therapy (Signed)
OUTPATIENT PHYSICAL THERAPY NEURO EVALUATION   Patient Name: Krystal [name] MRN: [id number] DOB:01-15-1951, 72 y.o., female Today's Date: 06/07/2023   PCP: Jerl Mina, MD REFERRING PROVIDER: Loa Socks, NP  END OF SESSION:  PT End of Session - 06/07/23 0932     Visit Number 1    Number of Visits 24    Progress Note Due on Visit 10    PT Start Time 0805    PT Stop Time 0844    PT Time Calculation (min) 39 min    Equipment Utilized During Treatment Gait belt    Activity Tolerance Patient tolerated treatment well    Behavior During Therapy WFL for tasks assessed/performed             Past Medical History:  Diagnosis Date   Anemia    Arthritis    osteo   Blindness of right eye at birth   Cancer [facility]) 2011   Left breast 2011 and cervical age 27   Carpal tunnel syndrome    Cataract    Depression    Edema 07/27/2011   Fibromyalgia    muscle weakness and pain   GERD (gastroesophageal reflux disease)    Hyperlipidemia    Hypertension    benign   Lymphedema    Osteoporosis    Plantar fasciitis    Pre-diabetes    Raynaud's disease    Restless leg syndrome    Rosacea    Rotator cuff rupture    Sleep apnea    Synovitis    Ulcer    Past Surgical History:  Procedure Laterality Date   ABDOMINAL HYSTERECTOMY  1986   For bleeding and pain   ATRIAL FIBRILLATION ABLATION N/A 05/14/2023   Procedure: ATRIAL FIBRILLATION ABLATION;  Surgeon: Lanier Prude, MD;  Location: [facility];  Service: Cardiovascular;  Laterality: N/A;   BLADDER SURGERY  2006   Bladder tack   BREAST RECONSTRUCTION Bilateral 09/17/2012   Procedure: BREAST RECONSTRUCTION;  Surgeon: Wayland Denis, DO;  Location: [facility];  Service: Plastics;  Laterality: Bilateral;  BILATERAL BREAST RECONSTRUCTION WITH TISSUE EXPANDERS AND ALLOMED   BREAST RECONSTRUCTION Right 06/03/2013   Procedure: RIGHT BREAST CAPSULE CONSTRACTURE;  Surgeon: Wayland Denis, DO;   Location: [facility];  Service: Plastics;  Laterality: Right;   CARDIOVERSION N/A 08/06/2022   Procedure: CARDIOVERSION;  Surgeon: Iran Ouch, MD;  Location: [facility];  Service: Cardiovascular;  Laterality: N/A;   CARDIOVERSION N/A 10/02/2022   Procedure: CARDIOVERSION;  Surgeon: Yvonne Kendall, MD;  Location: [facility];  Service: Cardiovascular;  Laterality: N/A;   CARPAL TUNNEL RELEASE Bilateral 2008   EYE SURGERY Right    INJECTION KNEE Left 08/12/2018   Procedure: KNEE INJECTION-LEFT;  Surgeon: Christena Flake, MD;  Location: [facility];  Service: Orthopedics;  Laterality: Left;   JOINT REPLACEMENT     LIPOSUCTION Bilateral 01/29/2013   Procedure: LIPOSUCTION;  Surgeon: Wayland Denis, DO;  Location: [facility];  Service: Plastics;  Laterality: Bilateral;   LIPOSUCTION Right 06/03/2013   Procedure: LIPOSUCTION;  Surgeon: Wayland Denis, DO;  Location: [facility];  Service: Plastics;  Laterality: Right;   MASTECTOMY  2012   rt prophalactic mast-snbx   MASTECTOMY MODIFIED RADICAL  2012   left-axillary nodes   NEUROMA SURGERY Bilateral    NOSE SURGERY  2007   PORT-A-CATH REMOVAL     insertion and   THROAT SURGERY     TONSILLECTOMY  TOTAL KNEE ARTHROPLASTY Right 08/12/2018   Procedure: TOTAL KNEE ARTHROPLASTY-RIGHT;  Surgeon: Christena Flake, MD;  Location: [facility];  Service: Orthopedics;  Laterality: Right;   TOTAL KNEE ARTHROPLASTY Left 09/22/2019   Procedure: TOTAL KNEE ARTHROPLASTY;  Surgeon: Christena Flake, MD;  Location: [facility];  Service: Orthopedics;  Laterality: Left;   UVULOPALATOPHARYNGOPLASTY     Patient Active Problem List   Diagnosis Date Noted   Raynaud's disease 11/05/2022   Osteoporosis, post-menopausal 11/05/2022   Atrial flutter (HCC) 10/02/2022   First degree AV block 09/11/2022   Arthritis 08/08/2022   Persistent atrial fibrillation (HCC) 08/06/2022   Depression 02/27/2022   Gastroesophageal reflux disease  02/27/2022   Hypertension 02/27/2022   Abnormal LFTs 02/27/2022   Alcohol abuse 02/27/2022   Atrial fibrillation with RVR (HCC) 02/27/2022   Morbid obesity (HCC) 02/27/2022   Pes anserinus bursitis of left knee 02/08/2020   Status post total knee replacement using cement, left 09/22/2019   Status post total knee replacement using cement, right 08/12/2018   Rotator cuff rupture    Status post bilateral breast reconstruction 02/06/2013   Acquired absence of bilateral breasts and nipples 09/17/2012   Breast cancer of lower-outer quadrant of left female breast (HCC) 01/16/2011    ONSET DATE: 07/23/02  REFERRING DIAG:  Y86.5,H84.1X5A (ICD-10-CM) - Chemotherapy-induced peripheral neuropathy (HCC)   THERAPY DIAG:  Muscle weakness (generalized)  Other lack of coordination  Other abnormalities of gait and mobility  Rationale for Evaluation and Treatment: Rehabilitation  SUBJECTIVE:                                                                                                                                                                                             SUBJECTIVE STATEMENT: Pt stated that she is having trouble feeling her feet when waking up in the morning and that its painful to tie her shoes. Pt stated that she is exercising [facility] the senior center and that she can feel her feet after being on them for a while, but feels pain when she gets back up from sitting for an extended period of time. Pt stated that it takes walking around and going down a flight of stairs in the morning to alleviate the pain after waking up in the morning. Pt stated that she utilizes ice, heat, and foot soaking to help alleviate the pain. Pt stated that she had breast removed 20 years ago and has had neuropathy in her hands and feet ever since. Pt stated that walking on different surfaces worsens the pain and she can not feel her feet at all when walking barefooted. Pt has  had previous hx of falls 5 and 6  years ago resulting in skull fractures.  Pt accompanied by: self  PERTINENT HISTORY: Hypertension, Atrial Fibrillation, First degree AV block, Atrial flutter, Raynaud's disease, GERD, Arthritis, Obesity, Breast cancer, Depression, alcohol abuse  PAIN:  Are you having pain? No  PRECAUTIONS: None  RED FLAGS: None   WEIGHT BEARING RESTRICTIONS: No  FALLS: Has patient fallen in last 6 months? No  LIVING ENVIRONMENT: Lives with: lives with their spouse Lives in: House/apartment Stairs:  5 steps outside front, 3 steps outside back, 13 steps inside, handrails on one side on all stairs Has following equipment at home: Single point cane  PLOF: Independent  PATIENT GOALS: Improve short term memory and be able to work in the yard efficiently  OBJECTIVE:  Note: Objective measures were completed at Evaluation unless otherwise noted.  DIAGNOSTIC FINDINGS: CT Cardiac Morph/Pulm Vein 06/02/23: 1. No acute extra cardiac abnormality. 2. Mild stable prominence of the main pulmonary arteries which may indicate degree of pulmonary arterial hypertension. 3. Aortic atherosclerosis.   COGNITION: Overall cognitive status: Within functional limits for tasks assessed   SENSATION: Not tested  COORDINATION: Not tested  EDEMA:  Not measured   MUSCLE TONE: Not tested  MUSCLE LENGTH: Not measured  DTRs:  Not evaluated  POSTURE: rounded shoulders  LOWER EXTREMITY ROM:     Active  Right Eval Left Eval  Hip flexion    Hip extension    Hip abduction    Hip adduction    Hip internal rotation    Hip external rotation    Knee flexion    Knee extension    Ankle dorsiflexion    Ankle plantarflexion    Ankle inversion    Ankle eversion     (Blank rows = not tested)  LOWER EXTREMITY MMT:    MMT Right Eval Left Eval  Hip flexion    Hip extension    Hip abduction    Hip adduction    Hip internal rotation    Hip external rotation    Knee flexion    Knee extension    Ankle  dorsiflexion    Ankle plantarflexion    Ankle inversion    Ankle eversion    (Blank rows = not tested)  TRANSFERS: Assistive device utilized: None  Sit to stand: CGA Stand to sit: CGA Chair to chair: CGA  RAMP:  Ramp Comments: Not tested this visit  CURB:  Curb Comments: Not tested this vist  STAIRS:  Comments: Not observed this visit  GAIT: Gait pattern: WFL Assistive device utilized: None Level of assistance: CGA  FUNCTIONAL TESTS:  Berg Balance Scale: 43  [facility] PT Assessment - 06/07/23 0001       Standardized Balance Assessment   Standardized Balance Assessment Berg Balance Test      Berg Balance Test   Sit to Stand Able to stand without using hands and stabilize independently    Standing Unsupported Able to stand 2 minutes with supervision    Sitting with Back Unsupported but Feet Supported on Floor or Stool Able to sit safely and securely 2 minutes    Stand to Sit Sits safely with minimal use of hands    Transfers Able to transfer safely, minor use of hands    Standing Unsupported with Eyes Closed Able to stand 10 seconds with supervision    Standing Unsupported with Feet Together Able to place feet together independently and stand for 1 minute with supervision    From  Standing, Reach Forward with Outstretched Arm Can reach forward >12 cm safely (5")    From Standing Position, Pick up Object from Floor Able to pick up shoe, needs supervision    From Standing Position, Turn to Look Behind Over each Shoulder Turn sideways only but maintains balance    Turn 360 Degrees Able to turn 360 degrees safely in 4 seconds or less    Standing Unsupported, Alternately Place Feet on Step/Stool Able to stand independently and safely and complete 8 steps in 20 seconds    Standing Unsupported, One Foot in Front Able to take small step independently and hold 30 seconds    Standing on One Leg Unable to try or needs assist to prevent fall    Total Score 43               PATIENT SURVEYS:  FOTO 76  TODAY'S TREATMENT:                                                                                                                              DATE: 06/07/2023   Eval Only, pt arrives a few minutes late so some tests and measures were not able to be finished at initial eval   EDUCATION Education details: POC Person educated: Patient Education method: Explanation Education comprehension: verbalized understanding   HOME EXERCISE PROGRAM: Establish visit 2   GOALS: Goals reviewed with patient? Yes  SHORT TERM GOALS: Target date: 07/12/2023     Patient will be independent in home exercise program to improve strength/mobility for better functional independence with ADLs. Baseline: No HEP currently  Goal status: INITIAL   LONG TERM GOALS: Target date: 08/30/2023    1.  Patient (> 29 years old) will improve five times sit to stand test by at least 3 seconds indicating an increased LE strength and improved balance. Baseline: To be assessed next visit Goal status: INITIAL  2.  Patient will increase FOTO score to equal to or greater than  80   to demonstrate statistically significant improvement in mobility and quality of life.  Baseline: 76 Goal status: INITIAL   3.  Patient will increase Berg Balance score by > 6 points to demonstrate decreased fall risk during functional activities. Baseline: 43 Goal status: INITIAL   4.  Patient will reduce timed up and go by at least 3 seconds to reduce fall risk and demonstrate improved transfer/gait ability. Baseline: To be assessed next visit Goal status: INITIAL  5.  Patient will increase 10 meter walk test by at least 0.13 m/s as to improve gait speed for better community ambulation and to reduce fall risk. Baseline: To be assessed next visit Goal status: INITIAL    ASSESSMENT:  CLINICAL IMPRESSION:  Patient is a 72 y.o. female who was seen today for physical therapy evaluation and treatment  for neuropathy in feet as a result of receiving chemotherapy for previous dx of breast cancer. Pt expressed how neuropathy in  her feet has created difficulty in certain activities in her daily life, such as working in the yard and ambulating on uneven surfaces. Pt demonstrated difficulties in certain portions of the [facility] Balance assessment, such as SLS and maintaining balance during tandem stance, based on overall BERG balance score pt is at increased risk for falls. Pt demonstrates good rehab potential by stating that she attends fitness classes at the local senior center. Pt expressed desire improve overall balance and pain in her feet . Pt will continue to benefit from skilled physical therapy intervention to address impairments, improve QOL, and attain therapy goals.   OBJECTIVE IMPAIRMENTS: decreased balance and difficulty walking.   ACTIVITY LIMITATIONS: lifting, standing, and stairs  PARTICIPATION LIMITATIONS: yard work  PERSONAL FACTORS: 1 comorbidity: previous dx of breast cancer  are also affecting patient's functional outcome.   REHAB POTENTIAL: Good  CLINICAL DECISION MAKING: Stable/uncomplicated  EVALUATION COMPLEXITY: Low  PLAN:  PT FREQUENCY: 2x/week  PT DURATION: 12 weeks  PLANNED INTERVENTIONS: 97164- PT Re-evaluation, 97110-Therapeutic exercises, 97530- Therapeutic activity, 97112- Neuromuscular re-education, 97535- Self Care, 28413- Manual therapy, 97116- Gait training, Balance training, Stair training, Cryotherapy, and Moist heat  PLAN FOR NEXT SESSION: 5xSTS,  LE strength testing.TUG, 10 meter walk test,, balance training, gait training, stair training   Debara Pickett, Student-PT 06/07/2023, 9:33 AM  This entire session was performed under direct supervision and direction of a licensed therapist/therapist assistant . I have personally read, edited and approve of the note as written.    This licensed clinician was present and actively directing care throughout the  session at all times.  Norman Herrlich PT ,DPT Physical Therapist- [place]  [facility]

## 2023-06-10 NOTE — Progress Notes (Unsigned)
Cardiology Office Note Date:  06/11/2023  Patient ID:  Krystal, [name] September 04, 1950, MRN [id number] PCP:  Jerl Mina, MD  Cardiologist:  Lorine Bears, MD Electrophysiologist: Sherryl Manges, MD > Lanier Prude, MD    Chief Complaint: afib ablation follow-up  History of Present Illness: Krystal [name] is a 73 y.o. female with PMH notable for persis AFib, HTN, OSA, ; seen today for Lanier Prude, MD for routine electrophysiology followup after AF ablation 05/14/2023.  During procedure PVI, posterior wall, and CTI for typical aflutter were ablated.   On follow-up today, she is doing very well. She has not had any afib recurrence since ablation procedure. Continues to take amiodarone daily. Diligently takes eliquis BID, no missed doses, no bleeding concerns.  She has finished her colchicine rx, continues to take protonix.  She has ongoing, stable lower extremity edema. Wears compression socks daily, elevates as much as she can. Has robust UOP with lasix. Adheres to low salt diet.     she denies chest pain, palpitations, dyspnea, PND, orthopnea, nausea, vomiting, syncope, edema, weight gain, or early satiety.     AAD History: Flecainide, ineffective Propafenone, ineffective Amiodarone - currently  Past Medical History:  Diagnosis Date   Anemia    Arthritis    osteo   Blindness of right eye at birth   Cancer [facility]) 2011   Left breast 2011 and cervical age 82   Carpal tunnel syndrome    Cataract    Depression    Edema 07/27/2011   Fibromyalgia    muscle weakness and pain   GERD (gastroesophageal reflux disease)    Hyperlipidemia    Hypertension    benign   Lymphedema    Osteoporosis    Plantar fasciitis    Pre-diabetes    Raynaud's disease    Restless leg syndrome    Rosacea    Rotator cuff rupture    Sleep apnea    Synovitis    Ulcer     Past Surgical History:  Procedure Laterality Date   ABDOMINAL HYSTERECTOMY  1986   For bleeding and pain    ATRIAL FIBRILLATION ABLATION N/A 05/14/2023   Procedure: ATRIAL FIBRILLATION ABLATION;  Surgeon: Lanier Prude, MD;  Location: [facility];  Service: Cardiovascular;  Laterality: N/A;   BLADDER SURGERY  2006   Bladder tack   BREAST RECONSTRUCTION Bilateral 09/17/2012   Procedure: BREAST RECONSTRUCTION;  Surgeon: Wayland Denis, DO;  Location: [facility];  Service: Plastics;  Laterality: Bilateral;  BILATERAL BREAST RECONSTRUCTION WITH TISSUE EXPANDERS AND ALLOMED   BREAST RECONSTRUCTION Right 06/03/2013   Procedure: RIGHT BREAST CAPSULE CONSTRACTURE;  Surgeon: Wayland Denis, DO;  Location: [facility];  Service: Plastics;  Laterality: Right;   CARDIOVERSION N/A 08/06/2022   Procedure: CARDIOVERSION;  Surgeon: Iran Ouch, MD;  Location: [facility];  Service: Cardiovascular;  Laterality: N/A;   CARDIOVERSION N/A 10/02/2022   Procedure: CARDIOVERSION;  Surgeon: Yvonne Kendall, MD;  Location: [facility];  Service: Cardiovascular;  Laterality: N/A;   CARPAL TUNNEL RELEASE Bilateral 2008   EYE SURGERY Right    INJECTION KNEE Left 08/12/2018   Procedure: KNEE INJECTION-LEFT;  Surgeon: Christena Flake, MD;  Location: [facility];  Service: Orthopedics;  Laterality: Left;   JOINT REPLACEMENT     LIPOSUCTION Bilateral 01/29/2013   Procedure: LIPOSUCTION;  Surgeon: Wayland Denis, DO;  Location: [facility];  Service: Plastics;  Laterality: Bilateral;   LIPOSUCTION Right 06/03/2013  Procedure: LIPOSUCTION;  Surgeon: Wayland Denis, DO;  Location: [facility];  Service: Plastics;  Laterality: Right;   MASTECTOMY  2012   rt prophalactic mast-snbx   MASTECTOMY MODIFIED RADICAL  2012   left-axillary nodes   NEUROMA SURGERY Bilateral    NOSE SURGERY  2007   PORT-A-CATH REMOVAL     insertion and   THROAT SURGERY     TONSILLECTOMY     TOTAL KNEE ARTHROPLASTY Right 08/12/2018   Procedure: TOTAL KNEE ARTHROPLASTY-RIGHT;  Surgeon: Christena Flake,  MD;  Location: [facility];  Service: Orthopedics;  Laterality: Right;   TOTAL KNEE ARTHROPLASTY Left 09/22/2019   Procedure: TOTAL KNEE ARTHROPLASTY;  Surgeon: Christena Flake, MD;  Location: [facility];  Service: Orthopedics;  Laterality: Left;   UVULOPALATOPHARYNGOPLASTY      Current Outpatient Medications  Medication Instructions   albuterol (VENTOLIN HFA) 108 (90 Base) MCG/ACT inhaler 2 puffs, Inhalation, Every 6 hours PRN   alendronate (FOSAMAX) 70 mg, Oral, Weekly, Take with a full glass of water on an empty stomach.   amiodarone (PACERONE) 200 MG tablet Take 1 tablet (200 mg) by mouth once daily   apixaban (ELIQUIS) 5 mg, Oral, 2 times daily   cetirizine (ZYRTEC) 10 mg, Daily   cholecalciferol (VITAMIN D3) 1,000 Units, Daily   diltiazem (DILACOR XR) 120 mg, Oral, Daily   DULoxetine (CYMBALTA) 60 mg, Oral, Daily   fluticasone (FLONASE) 50 MCG/ACT nasal spray 2 sprays, Each Nare, Daily   folic acid (FOLVITE) 1 mg, Oral, Daily   furosemide (LASIX) 20 mg, Oral, 2 times daily   Gemtesa 75 mg, Oral, Daily   levocetirizine (XYZAL) 5 mg, Oral, Every evening   LYSINE ACETATE PO 1,000 mg, Oral, Daily PRN   magnesium oxide (MAG-OX) 400 mg, Oral, Daily PRN   omeprazole (PRILOSEC) 20 mg, Daily   pantoprazole (PROTONIX) 40 mg, Oral, Daily   potassium chloride SA (KLOR-CON M) 20 MEQ tablet 20 mEq, Oral, 2 times daily   pregabalin (LYRICA) 300 mg, Oral, 2 times daily   rOPINIRole (REQUIP) 1 mg, Oral, Daily at bedtime   triamcinolone (KENALOG) 0.025 % cream 1 Application, Topical, As needed   Vitamin D (Ergocalciferol) (DRISDOL) 50,000 Units, Oral, Every 7 days   zolpidem (AMBIEN) 10 mg, Oral, Daily at bedtime    Social History:  The patient  reports that she has never smoked. She has never been exposed to tobacco smoke. She has never used smokeless tobacco. She reports that she does not currently use alcohol. She reports that she does not use drugs.   Family History:  The patient's family history  includes Breast cancer in her maternal aunt and maternal aunt; Cancer in her cousin, cousin, father, maternal grandfather, and maternal grandmother; Cancer (age of onset: 79) in her sister; Cancer (age of onset: 23) in her mother; Hypertension in her brother and sister; Liver cancer in her maternal uncle; Lung cancer in her father and maternal uncle; Throat cancer in her father and maternal uncle.  ROS:  Please see the history of present illness. All other systems are reviewed and otherwise negative.   PHYSICAL EXAM:  VS:  BP 122/74   Pulse 71   Ht 5\' 3"  (1.6 m)   Wt 173 lb 12.8 oz (78.8 kg)   SpO2 98%   BMI 30.79 kg/m  BMI: Body mass index is 30.79 kg/m.  GEN- The patient is well appearing, alert and oriented x 3 today.   Lungs- Clear to ausculation bilaterally, normal work  of breathing.  Heart- Regular rate and rhythm, no murmurs, rubs or gallops Extremities- Trace peripheral edema, warm, dry   EKG is ordered. Personal review of EKG from today shows:    EKG Interpretation Date/Time:  Tuesday June 11 2023 13:06:52 EST Ventricular Rate:  71 PR Interval:  220 QRS Duration:  94 QT Interval:  450 QTC Calculation: 489 R Axis:   -14  Text Interpretation: Sinus rhythm with 1st degree A-V block Confirmed by Sherie Don [phone]) on 06/11/2023 1:09:01 PM    Recent Labs: 06/29/2022: Magnesium 1.9 11/05/2022: ALT 8 05/08/2023: Hemoglobin 14.5; Platelets 256 05/14/2023: BUN 9; Creatinine, Ser 0.84; Potassium 3.3; Sodium 141  No results found for requested labs within last 365 days.   CrCl cannot be calculated (Patient's most recent lab result is older than the maximum 21 days allowed.).   Wt Readings from Last 3 Encounters:  06/11/23 173 lb 12.8 oz (78.8 kg)  05/14/23 173 lb (78.5 kg)  04/15/23 175 lb 11.2 oz (79.7 kg)     Additional studies reviewed include: Previous EP, cardiology notes.   Long Term Monitor, 06/21/2022 Patient had a min HR of 56 bpm, max HR of 194 bpm,  and avg HR of 90 bpm. Predominant underlying rhythm was Sinus Rhythm. First Degree AV Block was present.  2 wide-complex tachycardia runs occurred, the run with the fastest interval lasting 7 beats with a max rate of 193 bpm (avg 179 bpm); the run with the fastest interval was also the longest.  These could be due to ventricular tachycardia versus SVT with aberrancy. Atrial Fibrillation/Flutter occurred (29% burden), ranging from 56-194 bpm (avg of 105 bpm), the longest lasting 1 day 6 hours with an avg rate of 114 bpm. Atrial Fibrillation was present at activation of device. Atrial Fibrillation/Flutter was detected within +/- 45 seconds of symptomatic patient event(s).  Rare PACs and rare PVCs.  TTE bubble study, 02/27/2022  1. Left ventricular ejection fraction, by estimation, is 65 to 70%. The left ventricle has normal function. The left ventricle has no regional wall motion abnormalities. Left ventricular diastolic parameters were normal.   2. Right ventricular systolic function is normal. The right ventricular size is normal.   3. The mitral valve is normal in structure. Mild to moderate mitral valve regurgitation.   4. Tricuspid valve regurgitation is mild to moderate.   5. The aortic valve is normal in structure. Aortic valve regurgitation is not visualized.     ASSESSMENT AND PLAN:  #) aflutter #) afib S/p afib, flutter ablation 05/14/2023 with Dr. Lalla Brothers Maintaining sinus rhythm  Continue 200mg  amiodarone Update amio labs today including LFTs and thyroid labs  #) Hypercoag d/t persis afib CHA2DS2-VASc Score = 3 [CHF History: 0, HTN History: 1, Diabetes History: 0, Stroke History: 0, Vascular Disease History: 0, Age Score: 1, Gender Score: 1].  Therefore, the patient's annual risk of stroke is 3.2 %.    Stroke ppx - 5mg  eliquis BID, appropriately dosed No bleeding concerns  #) HTN Well-controlled on re-check Recommend checking BP 2-3 times per week and recording the measurements.  Bring measurements to follow-up appts.   #) OSA Encouraged nightly cpap usage       Current medicines are reviewed at length with the patient today.   The patient does not have concerns regarding her medicines.  The following changes were made today:  none  Labs/ tests ordered today include:  Orders Placed This Encounter  Procedures   Hepatic function panel   TSH  T4, free   Brain natriuretic peptide   EKG 12-Lead     Disposition: Follow up with Dr. Lalla Brothers or EP APP  2 months    Signed, Sherie Don, NP  06/11/23  1:31 PM  Electrophysiology [facility]

## 2023-06-11 ENCOUNTER — Ambulatory Visit: Payer: Medicare HMO | Attending: Cardiology | Admitting: Cardiology

## 2023-06-11 ENCOUNTER — Ambulatory Visit: Payer: Medicare HMO | Admitting: Occupational Therapy

## 2023-06-11 ENCOUNTER — Encounter: Payer: Self-pay | Admitting: Cardiology

## 2023-06-11 ENCOUNTER — Other Ambulatory Visit: Payer: Self-pay | Admitting: Internal Medicine

## 2023-06-11 ENCOUNTER — Encounter: Payer: Self-pay | Admitting: Occupational Therapy

## 2023-06-11 ENCOUNTER — Ambulatory Visit (HOSPITAL_COMMUNITY): Payer: Medicare HMO | Admitting: Internal Medicine

## 2023-06-11 VITALS — BP 122/74 | HR 71 | Ht 63.0 in | Wt 173.8 lb

## 2023-06-11 DIAGNOSIS — Z79899 Other long term (current) drug therapy: Secondary | ICD-10-CM

## 2023-06-11 DIAGNOSIS — M79672 Pain in left foot: Secondary | ICD-10-CM | POA: Diagnosis not present

## 2023-06-11 DIAGNOSIS — R2689 Other abnormalities of gait and mobility: Secondary | ICD-10-CM | POA: Diagnosis not present

## 2023-06-11 DIAGNOSIS — I4819 Other persistent atrial fibrillation: Secondary | ICD-10-CM

## 2023-06-11 DIAGNOSIS — I4892 Unspecified atrial flutter: Secondary | ICD-10-CM

## 2023-06-11 DIAGNOSIS — M25542 Pain in joints of left hand: Secondary | ICD-10-CM | POA: Diagnosis not present

## 2023-06-11 DIAGNOSIS — M25541 Pain in joints of right hand: Secondary | ICD-10-CM | POA: Diagnosis not present

## 2023-06-11 DIAGNOSIS — M79671 Pain in right foot: Secondary | ICD-10-CM | POA: Diagnosis not present

## 2023-06-11 DIAGNOSIS — I10 Essential (primary) hypertension: Secondary | ICD-10-CM

## 2023-06-11 DIAGNOSIS — M6281 Muscle weakness (generalized): Secondary | ICD-10-CM | POA: Diagnosis not present

## 2023-06-11 DIAGNOSIS — R278 Other lack of coordination: Secondary | ICD-10-CM | POA: Diagnosis not present

## 2023-06-11 DIAGNOSIS — R262 Difficulty in walking, not elsewhere classified: Secondary | ICD-10-CM | POA: Diagnosis not present

## 2023-06-11 DIAGNOSIS — G620 Drug-induced polyneuropathy: Secondary | ICD-10-CM | POA: Diagnosis not present

## 2023-06-11 NOTE — Patient Instructions (Signed)
Medication Instructions:  The current medical regimen is effective;  continue present plan and medications.  *If you need a refill on your cardiac medications before your next appointment, please call your pharmacy*   Lab Work: Your provider would like for you to have following labs drawn today LFT, TSH, FREE T4, BNP.   If you have labs (blood work) drawn today and your tests are completely normal, you will receive your results only by: MyChart Message (if you have MyChart) OR A paper copy in the mail If you have any lab test that is abnormal or we need to change your treatment, we will call you to review the results.   Follow-Up: At [facility], you and your health needs are our priority.  As part of our continuing mission to provide you with exceptional heart care, we have created designated Provider Care Teams.  These Care Teams include your primary Cardiologist (physician) and Advanced Practice Providers (APPs -  Physician Assistants and Nurse Practitioners) who all work together to provide you with the care you need, when you need it.  We recommend signing up for the patient portal called "MyChart".  Sign up information is provided on this After Visit Summary.  MyChart is used to connect with patients for Virtual Visits (Telemedicine).  Patients are able to view lab/test results, encounter notes, upcoming appointments, etc.  Non-urgent messages can be sent to your provider as well.   To learn more about what you can do with MyChart, go to [email or web address].    Your next appointment:   Keep scheduled, unless we call to change this.

## 2023-06-11 NOTE — Therapy (Signed)
[facility] [address] [place], [place], [address] Phone: [phone]   Fax:  [phone]  Occupational Therapy ReEvaluation  Patient Details  Name: Krystal [name] MRN: [id number] Date of Birth: June 19, 1951 Referring Provider (OT): DR Allena Katz   Encounter Date: 06/11/2023   OT End of Session - 06/11/23 1822     Visit Number 7    Number of Visits 13    Date for OT Re-Evaluation 07/23/23    OT Start Time 1535    OT Stop Time 1620    OT Time Calculation (min) 45 min    Activity Tolerance Patient tolerated treatment well    Behavior During Therapy [facility] for tasks assessed/performed             Past Medical History:  Diagnosis Date   Anemia    Arthritis    osteo   Blindness of right eye at birth   Cancer [facility]) 2011   Left breast 2011 and cervical age 66   Carpal tunnel syndrome    Cataract    Depression    Edema 07/27/2011   Fibromyalgia    muscle weakness and pain   GERD (gastroesophageal reflux disease)    Hyperlipidemia    Hypertension    benign   Lymphedema    Osteoporosis    Plantar fasciitis    Pre-diabetes    Raynaud's disease    Restless leg syndrome    Rosacea    Rotator cuff rupture    Sleep apnea    Synovitis    Ulcer     Past Surgical History:  Procedure Laterality Date   ABDOMINAL HYSTERECTOMY  1986   For bleeding and pain   ATRIAL FIBRILLATION ABLATION N/A 05/14/2023   Procedure: ATRIAL FIBRILLATION ABLATION;  Surgeon: Lanier Prude, MD;  [facility];  Service: Cardiovascular;  Laterality: N/A;   BLADDER SURGERY  2006   Bladder tack   BREAST RECONSTRUCTION Bilateral 09/17/2012   Procedure: BREAST RECONSTRUCTION;  Surgeon: Wayland Denis, DO;  Location: [facility];  Service: Plastics;  Laterality: Bilateral;  BILATERAL BREAST RECONSTRUCTION WITH TISSUE EXPANDERS AND ALLOMED   BREAST RECONSTRUCTION Right 06/03/2013   Procedure: RIGHT BREAST CAPSULE  CONSTRACTURE;  Surgeon: Wayland Denis, DO;  Location: [facility];  Service: Plastics;  Laterality: Right;   CARDIOVERSION N/A 08/06/2022   Procedure: CARDIOVERSION;  Surgeon: Iran Ouch, MD;  Location: [facility];  Service: Cardiovascular;  Laterality: N/A;   CARDIOVERSION N/A 10/02/2022   Procedure: CARDIOVERSION;  Surgeon: Yvonne Kendall, MD;  Location: [facility];  Service: Cardiovascular;  Laterality: N/A;   CARPAL TUNNEL RELEASE Bilateral 2008   EYE SURGERY Right    INJECTION KNEE Left 08/12/2018   Procedure: KNEE INJECTION-LEFT;  Surgeon: Christena Flake, MD;  Location: [facility];  Service: Orthopedics;  Laterality: Left;   JOINT REPLACEMENT     LIPOSUCTION Bilateral 01/29/2013   Procedure: LIPOSUCTION;  Surgeon: Wayland Denis, DO;  Location: [facility];  Service: Plastics;  Laterality: Bilateral;   LIPOSUCTION Right 06/03/2013   Procedure: LIPOSUCTION;  Surgeon: Wayland Denis, DO;  Location: [facility];  Service: Plastics;  Laterality: Right;   MASTECTOMY  2012   rt prophalactic mast-snbx   MASTECTOMY MODIFIED RADICAL  2012   left-axillary nodes   NEUROMA SURGERY Bilateral    NOSE SURGERY  2007   PORT-A-CATH REMOVAL     insertion and   THROAT SURGERY  TONSILLECTOMY     TOTAL KNEE ARTHROPLASTY Right 08/12/2018   Procedure: TOTAL KNEE ARTHROPLASTY-RIGHT;  Surgeon: Christena Flake, MD;  Location: [facility];  Service: Orthopedics;  Laterality: Right;   TOTAL KNEE ARTHROPLASTY Left 09/22/2019   Procedure: TOTAL KNEE ARTHROPLASTY;  Surgeon: Christena Flake, MD;  Location: [facility];  Service: Orthopedics;  Laterality: Left;   UVULOPALATOPHARYNGOPLASTY      There were no vitals filed for this visit.   Subjective Assessment - 06/11/23 1818     Subjective  I done 2 classes this am - exercise class and seated volley ball class- then pulled some weed in the yard -and then raked my neighbour yard- so my nands are hurting - did not see you for while -  [place] and then my dad past away - so a lot going on - my hands are worse - what can you do    Pertinent History 02/11/23 Dr Allena Katz note: Krystal [name] is a 72 y.o. female is here today for follow up of osteoarthritis. The patient's allergies, current medications, past family history, past medical history, past social history, past surgical history and problem list were reviewed and updated as appropriate.  She continues to have pain of the hand joints. She is taking Lyrica, Cymbalta and Plaquenil. She is not able to do things with her hands anymore. She has noticed some swelling of the hand joints. She has been offered [facility] surgery but she declined.Assessment and Plan  Osteoarthritis of multiple joints: active -- Unable to take NSAIDs due to being on blood thinners -- She states that Tramadol did not help. -- She states that Tylenol made her liver enzymes increase. -- Failed Plaquenil -- Continue Cymbalta. -- Trial of Methotrexate 10 mg weekly and Folic Acid 1 mg Daily -- Refer to OT  2. Fibromyalgia -- She is not taking gabapentin. -- Continue Lyrica 300 mg/day -- She continues to stay active.  3. Long term use of high risk medication -- Methotrexate is    Patient Stated Goals I don't want surgery - and want my pain in thumbs and fingers better so I can do things around the house, yard work, crafts, crochet, ,knit- and exercise classes    Currently in Pain? Yes    Pain Score 8     Pain Location Hand    Pain Orientation Right;Left    Pain Descriptors / Indicators Burning;Tightness;Tender;Sore;Constant    Pain Type Chronic pain    Pain Onset More than a month ago               [facility] OT Assessment - 06/11/23 0001       AROM   Right Wrist Extension 60 Degrees    Right Wrist Flexion 85 Degrees    Left Wrist Extension 65 Degrees    Left Wrist Flexion 80 Degrees      Strength   Right Hand Grip (lbs) 30    Right Hand Lateral Pinch 6 lbs    Right Hand 3 Point Pinch 2 lbs    Left Hand  Grip (lbs) 25    Left Hand Lateral Pinch 10 lbs    Left Hand 3 Point Pinch 5 lbs      Right Hand AROM   R Thumb Radial ABduction/ADduction 0-55 55    R Thumb Palmar ABduction/ADduction 0-45 55    R Thumb Opposition to Index --   Oppstion pain to 3rd thru 5th   R Index  MCP 0-90  85 Degrees    R Index PIP 0-100 100 Degrees    R Long  MCP 0-90 90 Degrees    R Long PIP 0-100 100 Degrees    R Ring  MCP 0-90 90 Degrees    R Ring PIP 0-100 100 Degrees    R Little  MCP 0-90 90 Degrees    R Little PIP 0-100 100 Degrees      Left Hand AROM   L Thumb Radial ADduction/ABduction 0-55 40    L Thumb Palmar ADduction/ABduction 0-45 50    L Thumb Opposition to Index --   Opposition pain to 3rd thru 5th   L Index  MCP 0-90 80 Degrees    L Index PIP 0-100 95 Degrees    L Long  MCP 0-90 90 Degrees    L Long PIP 0-100 100 Degrees    L Ring  MCP 0-90 90 Degrees    L Ring PIP 0-100 100 Degrees    L Little  MCP 0-90 90 Degrees    L Little PIP 0-100 100 Degrees              REINFORCE AGAIN getting to aquatics- flareup OA education  And review again contrast  And tendon glides gentle AROM  10 reps  Fitted with new isotoner gloves                      OT Long Term Goals - 06/11/23 1830       OT LONG TERM GOAL #1   Title Pt to be independent in HEP to decrease pain to less than 5/10 while maintain AROM in bilateral hands    Baseline AROM in digits WNL - thumb decrease opposition and PA and RA - pain 10/10 R hand , L 8/10 - tenderness over several A1pulley's Sept decreease -but now flare up again after Dad past away and was not seen for about 2 months    Time 4    Period Weeks    Status On-going    Target Date 07/09/23      OT LONG TERM GOAL #2   Title Pt to verbalize 3 joint protection and AE to decrease pain and increase ease of use of hands with less pain    Baseline Pt did very well with AE and modifications but since not seen 2 months and Dad past away - having  flareup again    Time 4    Period Weeks    Status On-going    Target Date 07/09/23      OT LONG TERM GOAL #3   Title Pt show AROM WFL in bilateral hands with digits -  pain less than 4/10 during ADL's    Baseline R hand pain 10/10 , L 8/10 and  AROM WFL except thumb opposition, PA and RA NOW pain again  increase to 8-10/10 in bilateral hands including thumbs    Time 6    Period Weeks    Status On-going    Target Date 07/23/23      OT LONG TERM GOAL #4   Title Pt to impliment lifestyle changes to increase time for self to do exercise and family to be more independent    Baseline Use to do aquitics exercises in water 8 months ago- help dad in everything now bathing, eating, dressing - more than 75 % - help husband - dad has aid 20 hrs week - carry and unpack self groceries - and prep  and cook SEPT  dad in ass living , and started  water exercise clasess , and modifying activities- and taking care of herself  NOW dad past away was out of town prior not been to [employer], over doing yard work , and HEP - increase pain again 10/10    Time 6    Period Weeks    Status On-going    Target Date 07/23/23                   Plan - 06/11/23 1824     Clinical Impression Statement Pt refer In July by Dr Allena Katz with pain in bilateral hands - OA and changes to bilateral hands , all digits and joints -pt was seen for 6 visits -and pain at rest decrease to 2/10 in bilateral hands - she was doing really well with modifications of tasks and AE use - Also doing modalities to decrease stiffness and pain - Pt was out of town at [employer] and then her Dad past away and was not seen since Sept- pt arrive with AROM in bilateral hands and wrist about same -WNL but with increase pain and 3rd and 4th digits on bilatral hands triggering and increase tenderness- Her grip and prehension strenght decrease greatly compare to Sept - and pt appear to have flare up in her OA - with joint changes at DIP's and PIP's -  increase edema and pain- REview again with pt HEP , gentle pain free AROM - modifications and pacing herself -   Pt can benefit from skilled OT services to decrease pain , edema-increase strength while reviewing again modifcations and AE to decrease pain and increase ease of use of bilateral hands in ADL's and IADL's.    OT Occupational Profile and History Problem Focused Assessment - Including review of records relating to presenting problem    Occupational performance deficits (Please refer to evaluation for details): ADL's;IADL's;Leisure;Social Participation;Play;Rest and Sleep    Body Structure / Function / Physical Skills ADL;Coordination;IADL;Pain;Strength;Edema;FMC;Decreased knowledge of use of DME;Flexibility;ROM;UE functional use    Rehab Potential Fair    Clinical Decision Making Several treatment options, min-mod task modification necessary    Comorbidities Affecting Occupational Performance: --   OA in multiple joints   Modification or Assistance to Complete Evaluation  No modification of tasks or assist necessary to complete eval    OT Frequency 1x / week    OT Duration 6 weeks    OT Treatment/Interventions Self-care/ADL training;Therapeutic exercise;Patient/family education;Splinting;Fluidtherapy;Contrast Bath;Ultrasound;DME and/or AE instruction;Manual Therapy;Passive range of motion;Paraffin;Iontophoresis    Consulted and Agree with Plan of Care Patient             Patient will benefit from skilled therapeutic intervention in order to improve the following deficits and impairments:   Body Structure / Function / Physical Skills: ADL, Coordination, IADL, Pain, Strength, Edema, FMC, Decreased knowledge of use of DME, Flexibility, ROM, UE functional use       Visit Diagnosis: Joint pain in both hands    Problem List Patient Active Problem List   Diagnosis Date Noted   Raynaud's disease 11/05/2022   Osteoporosis, post-menopausal 11/05/2022   Atrial flutter (HCC)  10/02/2022   First degree AV block 09/11/2022   Arthritis 08/08/2022   Persistent atrial fibrillation (HCC) 08/06/2022   Depression 02/27/2022   Gastroesophageal reflux disease 02/27/2022   Hypertension 02/27/2022   Abnormal LFTs 02/27/2022   Alcohol abuse 02/27/2022   Atrial fibrillation with RVR (HCC) 02/27/2022   Morbid obesity (  HCC) 02/27/2022   Pes anserinus bursitis of left knee 02/08/2020   Status post total knee replacement using cement, left 09/22/2019   Status post total knee replacement using cement, right 08/12/2018   Rotator cuff rupture    Status post bilateral breast reconstruction 02/06/2013   Acquired absence of bilateral breasts and nipples 09/17/2012   Breast cancer of lower-outer quadrant of left female breast (HCC) 01/16/2011    Oletta Cohn, OTR/L,CLT 06/11/2023, 6:34 PM  [facility] [address], [place], [address] Phone: [phone]   Fax:  [phone]  Name: Krystal [name] MRN: [id number] Date of Birth: Nov 19, 1950

## 2023-06-12 ENCOUNTER — Ambulatory Visit: Payer: Medicare HMO | Admitting: Physical Therapy

## 2023-06-12 ENCOUNTER — Encounter: Payer: Self-pay | Admitting: Physical Therapy

## 2023-06-12 DIAGNOSIS — M25541 Pain in joints of right hand: Secondary | ICD-10-CM | POA: Diagnosis not present

## 2023-06-12 DIAGNOSIS — R262 Difficulty in walking, not elsewhere classified: Secondary | ICD-10-CM | POA: Diagnosis not present

## 2023-06-12 DIAGNOSIS — R2689 Other abnormalities of gait and mobility: Secondary | ICD-10-CM

## 2023-06-12 DIAGNOSIS — M6281 Muscle weakness (generalized): Secondary | ICD-10-CM | POA: Diagnosis not present

## 2023-06-12 DIAGNOSIS — I4892 Unspecified atrial flutter: Secondary | ICD-10-CM | POA: Diagnosis not present

## 2023-06-12 DIAGNOSIS — G620 Drug-induced polyneuropathy: Secondary | ICD-10-CM | POA: Diagnosis not present

## 2023-06-12 DIAGNOSIS — J340 Abscess, furuncle and carbuncle of nose: Secondary | ICD-10-CM | POA: Diagnosis not present

## 2023-06-12 DIAGNOSIS — I4819 Other persistent atrial fibrillation: Secondary | ICD-10-CM | POA: Diagnosis not present

## 2023-06-12 DIAGNOSIS — R278 Other lack of coordination: Secondary | ICD-10-CM

## 2023-06-12 DIAGNOSIS — H60543 Acute eczematoid otitis externa, bilateral: Secondary | ICD-10-CM | POA: Diagnosis not present

## 2023-06-12 DIAGNOSIS — J302 Other seasonal allergic rhinitis: Secondary | ICD-10-CM | POA: Diagnosis not present

## 2023-06-12 DIAGNOSIS — Z79899 Other long term (current) drug therapy: Secondary | ICD-10-CM | POA: Diagnosis not present

## 2023-06-12 DIAGNOSIS — I10 Essential (primary) hypertension: Secondary | ICD-10-CM | POA: Diagnosis not present

## 2023-06-12 DIAGNOSIS — M79672 Pain in left foot: Secondary | ICD-10-CM | POA: Diagnosis not present

## 2023-06-12 DIAGNOSIS — M79671 Pain in right foot: Secondary | ICD-10-CM | POA: Diagnosis not present

## 2023-06-12 DIAGNOSIS — M25542 Pain in joints of left hand: Secondary | ICD-10-CM | POA: Diagnosis not present

## 2023-06-12 NOTE — Therapy (Signed)
OUTPATIENT PHYSICAL THERAPY NEURO EVALUATION   Patient Name: Krystal [name] MRN: [id number] DOB:[birth year], 72 y.o., female Today's Date: 06/12/2023   PCP: Jerl Mina, MD REFERRING PROVIDER: Loa Socks, NP  END OF SESSION:  PT End of Session - 06/12/23 0844     Visit Number 2    Number of Visits 24    Progress Note Due on Visit 10    PT Start Time 0846    PT Stop Time 0928    PT Time Calculation (min) 42 min    Equipment Utilized During Treatment Gait belt    Activity Tolerance Patient tolerated treatment well    Behavior During Therapy [facility] for tasks assessed/performed             Past Medical History:  Diagnosis Date   Anemia    Arthritis    osteo   Blindness of right eye at birth   Cancer [facility]) 2011   Left breast 2011 and cervical age 10   Carpal tunnel syndrome    Cataract    Depression    Edema 07/27/2011   Fibromyalgia    muscle weakness and pain   GERD (gastroesophageal reflux disease)    Hyperlipidemia    Hypertension    benign   Lymphedema    Osteoporosis    Plantar fasciitis    Pre-diabetes    Raynaud's disease    Restless leg syndrome    Rosacea    Rotator cuff rupture    Sleep apnea    Synovitis    Ulcer    Past Surgical History:  Procedure Laterality Date   ABDOMINAL HYSTERECTOMY  1986   For bleeding and pain   ATRIAL FIBRILLATION ABLATION N/A 05/14/2023   Procedure: ATRIAL FIBRILLATION ABLATION;  Surgeon: Lanier Prude, MD;  Location: [facility];  Service: Cardiovascular;  Laterality: N/A;   BLADDER SURGERY  2006   Bladder tack   BREAST RECONSTRUCTION Bilateral 09/17/2012   Procedure: BREAST RECONSTRUCTION;  Surgeon: Wayland Denis, DO;  Location: [facility];  Service: Plastics;  Laterality: Bilateral;  BILATERAL BREAST RECONSTRUCTION WITH TISSUE EXPANDERS AND ALLOMED   BREAST RECONSTRUCTION Right 06/03/2013   Procedure: RIGHT BREAST CAPSULE CONSTRACTURE;  Surgeon: Wayland Denis, DO;   Location: [facility];  Service: Plastics;  Laterality: Right;   CARDIOVERSION N/A 08/06/2022   Procedure: CARDIOVERSION;  Surgeon: Iran Ouch, MD;  Location: [facility];  Service: Cardiovascular;  Laterality: N/A;   CARDIOVERSION N/A 10/02/2022   Procedure: CARDIOVERSION;  Surgeon: Yvonne Kendall, MD;  Location: [facility];  Service: Cardiovascular;  Laterality: N/A;   CARPAL TUNNEL RELEASE Bilateral 2008   EYE SURGERY Right    INJECTION KNEE Left 08/12/2018   Procedure: KNEE INJECTION-LEFT;  Surgeon: Christena Flake, MD;  Location: [facility];  Service: Orthopedics;  Laterality: Left;   JOINT REPLACEMENT     LIPOSUCTION Bilateral 01/29/2013   Procedure: LIPOSUCTION;  Surgeon: Wayland Denis, DO;  Location: [facility];  Service: Plastics;  Laterality: Bilateral;   LIPOSUCTION Right 06/03/2013   Procedure: LIPOSUCTION;  Surgeon: Wayland Denis, DO;  Location:  SURGERY CENTER;  Service: Plastics;  Laterality: Right;   MASTECTOMY  2012   rt prophalactic mast-snbx   MASTECTOMY MODIFIED RADICAL  2012   left-axillary nodes   NEUROMA SURGERY Bilateral    NOSE SURGERY  2007   PORT-A-CATH REMOVAL     insertion and   THROAT SURGERY     TONSILLECTOMY  TOTAL KNEE ARTHROPLASTY Right 08/12/2018   Procedure: TOTAL KNEE ARTHROPLASTY-RIGHT;  Surgeon: Christena Flake, MD;  Location: [facility];  Service: Orthopedics;  Laterality: Right;   TOTAL KNEE ARTHROPLASTY Left 09/22/2019   Procedure: TOTAL KNEE ARTHROPLASTY;  Surgeon: Christena Flake, MD;  Location: [facility];  Service: Orthopedics;  Laterality: Left;   UVULOPALATOPHARYNGOPLASTY     Patient Active Problem List   Diagnosis Date Noted   Raynaud's disease 11/05/2022   Osteoporosis, post-menopausal 11/05/2022   Atrial flutter (HCC) 10/02/2022   First degree AV block 09/11/2022   Arthritis 08/08/2022   Persistent atrial fibrillation (HCC) 08/06/2022   Depression 02/27/2022   Gastroesophageal reflux disease  02/27/2022   Hypertension 02/27/2022   Abnormal LFTs 02/27/2022   Alcohol abuse 02/27/2022   Atrial fibrillation with RVR (HCC) 02/27/2022   Morbid obesity (HCC) 02/27/2022   Pes anserinus bursitis of left knee 02/08/2020   Status post total knee replacement using cement, left 09/22/2019   Status post total knee replacement using cement, right 08/12/2018   Rotator cuff rupture    Status post bilateral breast reconstruction 02/06/2013   Acquired absence of bilateral breasts and nipples 09/17/2012   Breast cancer of lower-outer quadrant of left female breast (HCC) 01/16/2011    ONSET DATE: 07/23/02  REFERRING DIAG:  W29.5,A21.1X5A (ICD-10-CM) - Chemotherapy-induced peripheral neuropathy (HCC)   THERAPY DIAG:  Muscle weakness (generalized)  Other lack of coordination  Other abnormalities of gait and mobility  Rationale for Evaluation and Treatment: Rehabilitation  SUBJECTIVE:                                                                                                                                                                                             SUBJECTIVE STATEMENT: Pt reported no changes or falls since last visit. Pt reported 0/10 on NPS, but stated that she will most likely feel pain if she is on her feet for an extended period of time.    Pt accompanied by: self  PERTINENT HISTORY: Hypertension, Atrial Fibrillation, First degree AV block, Atrial flutter, Raynaud's disease, GERD, Arthritis, Obesity, Breast cancer, Depression, alcohol abuse  PAIN:  Are you having pain? No  PRECAUTIONS: None  RED FLAGS: None   WEIGHT BEARING RESTRICTIONS: No  FALLS: Has patient fallen in last 6 months? No  LIVING ENVIRONMENT: Lives with: lives with their spouse Lives in: House/apartment Stairs:  5 steps outside front, 3 steps outside back, 13 steps inside, handrails on one side on all stairs Has following equipment at home: Single point cane  PLOF:  Independent  PATIENT GOALS: Improve short term memory and be able to work  in the yard efficiently  OBJECTIVE:  Note: Objective measures were completed at Evaluation unless otherwise noted.  DIAGNOSTIC FINDINGS: CT Cardiac Morph/Pulm Vein 06/02/23: 1. No acute extra cardiac abnormality. 2. Mild stable prominence of the main pulmonary arteries which may indicate degree of pulmonary arterial hypertension. 3. Aortic atherosclerosis.   COGNITION: Overall cognitive status: Within functional limits for tasks assessed   SENSATION: Not tested  COORDINATION: Not tested  EDEMA:  Not measured   MUSCLE TONE: Not tested  MUSCLE LENGTH: Not measured  DTRs:  Not evaluated  POSTURE: rounded shoulders  LOWER EXTREMITY ROM:     Active  Right Eval Left Eval  Hip flexion    Hip extension    Hip abduction    Hip adduction    Hip internal rotation    Hip external rotation    Knee flexion    Knee extension    Ankle dorsiflexion    Ankle plantarflexion    Ankle inversion    Ankle eversion     (Blank rows = not tested)  LOWER EXTREMITY MMT:    MMT Right Eval Left Eval  Hip flexion 4 4  Hip extension    Hip abduction    Hip adduction    Hip internal rotation    Hip external rotation    Knee flexion    Knee extension 5 5  Ankle dorsiflexion 4 4  Ankle plantarflexion    Ankle inversion    Ankle eversion    (Blank rows = not tested)  TRANSFERS: Assistive device utilized: None  Sit to stand: CGA Stand to sit: CGA Chair to chair: CGA  RAMP:  Ramp Comments: Not tested this visit  CURB:  Curb Comments: Not tested this vist  STAIRS:  Comments: Not observed this visit  GAIT: Gait pattern: WFL Assistive device utilized: None Level of assistance: CGA  FUNCTIONAL TESTS:  Berg Balance Scale: 43     PATIENT SURVEYS:  FOTO 76  TODAY'S TREATMENT:                                                                                                                               DATE: 06/07/2023   Physical therapy treatment session today consisted of completing baseline of goals and administration of testing as demonstrated and documented in flow sheet, treatment, and goals section of this note. Addition treatments may be found below.    TE Ambulation 3# AW 450 feet Hip abductions 3# AW 2x10 each LE Hip extensions 3# AW 2x10 each LE Squats with UE support 2x10   NMR Static standing on airex pad 3x30 sec SLS with UE support 3x30 sec each LE  EDUCATION Education details: POC Person educated: Patient Education method: Explanation Education comprehension: verbalized understanding   HOME EXERCISE PROGRAM: Access Code: [facility] URL: [email or web address] Date: 06/12/2023 Prepared by: Thresa Ross  Exercises - Standing Single Leg Stance with Counter Support  - 1 x daily - 7 x weekly -  3 sets - 30 second hold - Mini Squat with Counter Support  - 1 x daily - 7 x weekly - 2 sets - 10 reps  GOALS: Goals reviewed with patient? Yes  SHORT TERM GOALS: Target date: 07/12/2023     Patient will be independent in home exercise program to improve strength/mobility for better functional independence with ADLs. Baseline: Administered on 11/20  Goal status: INITIAL   LONG TERM GOALS: Target date: 08/30/2023    1.  Patient (> 35 years old) will improve five times sit to stand test by at least 3 seconds indicating an increased LE strength and improved balance. Baseline: 11/20: 14.19 Goal status: INITIAL  2.  Patient will increase FOTO score to equal to or greater than  80   to demonstrate statistically significant improvement in mobility and quality of life.  Baseline: 76 Goal status: INITIAL   3.  Patient will increase Berg Balance score by > 6 points to demonstrate decreased fall risk during functional activities. Baseline: 43 Goal status: INITIAL   4.  Patient will reduce timed up and go by at least 3 seconds to reduce fall risk  and demonstrate improved transfer/gait ability. Baseline: To be assessed next visit Goal status: INITIAL  5.  Patient will increase 10 meter walk test by at least 0.13 m/s as to improve gait speed for better community ambulation and to reduce fall risk. Baseline: To be assessed next visit Goal status: INITIAL    ASSESSMENT:  CLINICAL IMPRESSION:  Today's visit consisted of completing baseline testing for goals established in the initial evaluation, as well as therex and NMR. The TUG was administered, but pt did not comprehend instructions of test and multiple attempts were completed. Therefore, an accurate assessment of TUG time was not possible today, due to pt having foresight of the test. The 10 meter walk test was administered as well, but an accurate assessment was not achieved, due to pt ambulating at an unsafe pace. These two tests will be re-attempted next visit. Pt tolerated all other tasks during today's visit. Pt was prescribed HEP and was educated on proper technique of how to maintain proper alignment of spine when picking things up and pulling weeds in her yard. Pt reported NMR tasks being the most difficult portion of today's visit. Pt will continue to benefit from skilled physical therapy intervention to address impairments, improve QOL, and attain therapy goals.   OBJECTIVE IMPAIRMENTS: decreased balance and difficulty walking.   ACTIVITY LIMITATIONS: lifting, standing, and stairs  PARTICIPATION LIMITATIONS: yard work  PERSONAL FACTORS: 1 comorbidity: previous dx of breast cancer  are also affecting patient's functional outcome.   REHAB POTENTIAL: Good  CLINICAL DECISION MAKING: Stable/uncomplicated  EVALUATION COMPLEXITY: Low  PLAN:  PT FREQUENCY: 2x/week  PT DURATION: 12 weeks  PLANNED INTERVENTIONS: 97164- PT Re-evaluation, 97110-Therapeutic exercises, 97530- Therapeutic activity, 97112- Neuromuscular re-education, 97535- Self Care, 16109- Manual therapy,  [phone]- Gait training, Balance training, Stair training, Cryotherapy, and Moist heat  PLAN FOR NEXT SESSION: - ensure SAFE speed, TUG: ensure pt performs correctly on first rep , LE strengthening, balance training, gait training, stair training   Debara Pickett, Student-PT 06/12/2023, 12:53 PM  This entire session was performed under direct supervision and direction of a licensed therapist/therapist assistant . I have personally read, edited and approve of the note as written.    This licensed clinician was present and actively directing care throughout the session at all times.  Norman Herrlich PT ,DPT Physical Therapist- [place]  [facility]

## 2023-06-13 LAB — HEPATIC FUNCTION PANEL
ALT: 16 [IU]/L (ref 0–32)
AST: 26 [IU]/L (ref 0–40)
Albumin: 4.5 g/dL (ref 3.9–4.9)
Alkaline Phosphatase: 93 [IU]/L (ref 44–121)
Bilirubin Total: 0.4 mg/dL (ref 0.0–1.2)
Bilirubin, Direct: 0.15 mg/dL (ref 0.00–0.40)
Total Protein: 7.2 g/dL (ref 6.0–8.5)

## 2023-06-13 LAB — BRAIN NATRIURETIC PEPTIDE: BNP: 29.6 pg/mL (ref 0.0–100.0)

## 2023-06-13 LAB — T4, FREE: Free T4: 1.53 ng/dL (ref 0.82–1.77)

## 2023-06-13 LAB — TSH: TSH: 2.53 u[IU]/mL (ref 0.450–4.500)

## 2023-06-14 ENCOUNTER — Ambulatory Visit: Payer: Medicare HMO | Admitting: Physical Therapy

## 2023-06-14 ENCOUNTER — Ambulatory Visit (HOSPITAL_COMMUNITY): Payer: Medicare HMO | Admitting: Internal Medicine

## 2023-06-17 ENCOUNTER — Other Ambulatory Visit: Payer: Self-pay | Admitting: Internal Medicine

## 2023-06-18 ENCOUNTER — Telehealth: Payer: Self-pay | Admitting: Internal Medicine

## 2023-06-18 ENCOUNTER — Ambulatory Visit: Payer: Medicare HMO

## 2023-06-18 ENCOUNTER — Other Ambulatory Visit: Payer: Self-pay

## 2023-06-18 DIAGNOSIS — R262 Difficulty in walking, not elsewhere classified: Secondary | ICD-10-CM | POA: Diagnosis not present

## 2023-06-18 DIAGNOSIS — M79672 Pain in left foot: Secondary | ICD-10-CM

## 2023-06-18 DIAGNOSIS — M6281 Muscle weakness (generalized): Secondary | ICD-10-CM | POA: Diagnosis not present

## 2023-06-18 DIAGNOSIS — M25542 Pain in joints of left hand: Secondary | ICD-10-CM | POA: Diagnosis not present

## 2023-06-18 DIAGNOSIS — G620 Drug-induced polyneuropathy: Secondary | ICD-10-CM | POA: Diagnosis not present

## 2023-06-18 DIAGNOSIS — R2689 Other abnormalities of gait and mobility: Secondary | ICD-10-CM | POA: Diagnosis not present

## 2023-06-18 DIAGNOSIS — M79671 Pain in right foot: Secondary | ICD-10-CM

## 2023-06-18 DIAGNOSIS — M25541 Pain in joints of right hand: Secondary | ICD-10-CM | POA: Diagnosis not present

## 2023-06-18 DIAGNOSIS — R278 Other lack of coordination: Secondary | ICD-10-CM | POA: Diagnosis not present

## 2023-06-18 MED ORDER — APIXABAN 5 MG PO TABS: 5.0000 mg | ORAL_TABLET | Freq: Two times a day (BID) | ORAL | 5 refills | Status: DC

## 2023-06-18 NOTE — Telephone Encounter (Signed)
Prescription refill request for Eliquis received. Indication:afib Last office visit:11/24 Scr:0.84  10/24 Age: 72 Weight:78.8  kg  Prescription refilled

## 2023-06-18 NOTE — Telephone Encounter (Signed)
Krystal [name], could you review chart to verify patient is suppose to take this med?  Not previously filled by provider at this office.  You saw her on 06/21/23 and is scheduled to see you on 08/19/23.

## 2023-06-18 NOTE — Telephone Encounter (Signed)
*  STAT* If patient is at the pharmacy, call can be transferred to refill team.   1. Which medications need to be refilled? (please list name of each medication and dose if known) Eliquis 5 MG   2. Would you like to learn more about the convenience, safety, & potential cost savings by using the [facility]? no     3. Are you open to using the [facility] (Type [facility]. no.   4. Which pharmacy/location (including street and city if local pharmacy) is medication to be sent to? [facility] on [address]   5. Do they need a 30 day or 90 day supply? 30 day

## 2023-06-18 NOTE — Therapy (Signed)
OUTPATIENT PHYSICAL THERAPY NEURO EVALUATION   Patient Name: Krystal [name] MRN: [id number] DOB:03-31-1951, 72 y.o., female Today's Date: 06/18/2023   PCP: Jerl Mina, MD REFERRING PROVIDER: Loa Socks, NP  END OF SESSION:  PT End of Session - 06/18/23 1127     Visit Number 3    Number of Visits 24    Progress Note Due on Visit 10    PT Start Time 0757    PT Stop Time 0842    PT Time Calculation (min) 45 min    Equipment Utilized During Treatment Gait belt    Activity Tolerance Patient tolerated treatment well              Past Medical History:  Diagnosis Date   Anemia    Arthritis    osteo   Blindness of right eye at birth   Cancer [facility]) 2011   Left breast 2011 and cervical age 76   Carpal tunnel syndrome    Cataract    Depression    Edema 07/27/2011   Fibromyalgia    muscle weakness and pain   GERD (gastroesophageal reflux disease)    Hyperlipidemia    Hypertension    benign   Lymphedema    Osteoporosis    Plantar fasciitis    Pre-diabetes    Raynaud's disease    Restless leg syndrome    Rosacea    Rotator cuff rupture    Sleep apnea    Synovitis    Ulcer    Past Surgical History:  Procedure Laterality Date   ABDOMINAL HYSTERECTOMY  1986   For bleeding and pain   ATRIAL FIBRILLATION ABLATION N/A 05/14/2023   Procedure: ATRIAL FIBRILLATION ABLATION;  Surgeon: Lanier Prude, MD;  Location: [facility];  Service: Cardiovascular;  Laterality: N/A;   BLADDER SURGERY  2006   Bladder tack   BREAST RECONSTRUCTION Bilateral 09/17/2012   Procedure: BREAST RECONSTRUCTION;  Surgeon: Wayland Denis, DO;  Location: [facility];  Service: Plastics;  Laterality: Bilateral;  BILATERAL BREAST RECONSTRUCTION WITH TISSUE EXPANDERS AND ALLOMED   BREAST RECONSTRUCTION Right 06/03/2013   Procedure: RIGHT BREAST CAPSULE CONSTRACTURE;  Surgeon: Wayland Denis, DO;  Location: [facility];  Service: Plastics;   Laterality: Right;   CARDIOVERSION N/A 08/06/2022   Procedure: CARDIOVERSION;  Surgeon: Iran Ouch, MD;  Location: [facility];  Service: Cardiovascular;  Laterality: N/A;   CARDIOVERSION N/A 10/02/2022   Procedure: CARDIOVERSION;  Surgeon: Yvonne Kendall, MD;  Location: [facility];  Service: Cardiovascular;  Laterality: N/A;   CARPAL TUNNEL RELEASE Bilateral 2008   EYE SURGERY Right    INJECTION KNEE Left 08/12/2018   Procedure: KNEE INJECTION-LEFT;  Surgeon: Christena Flake, MD;  Location: [facility];  Service: Orthopedics;  Laterality: Left;   JOINT REPLACEMENT     LIPOSUCTION Bilateral 01/29/2013   Procedure: LIPOSUCTION;  Surgeon: Wayland Denis, DO;  Location: [facility];  Service: Plastics;  Laterality: Bilateral;   LIPOSUCTION Right 06/03/2013   Procedure: LIPOSUCTION;  Surgeon: Wayland Denis, DO;  Location: [facility];  Service: Plastics;  Laterality: Right;   MASTECTOMY  2012   rt prophalactic mast-snbx   MASTECTOMY MODIFIED RADICAL  2012   left-axillary nodes   NEUROMA SURGERY Bilateral    NOSE SURGERY  2007   PORT-A-CATH REMOVAL     insertion and   THROAT SURGERY     TONSILLECTOMY     TOTAL KNEE ARTHROPLASTY Right 08/12/2018   Procedure: TOTAL  KNEE ARTHROPLASTY-RIGHT;  Surgeon: Christena Flake, MD;  Location: [facility];  Service: Orthopedics;  Laterality: Right;   TOTAL KNEE ARTHROPLASTY Left 09/22/2019   Procedure: TOTAL KNEE ARTHROPLASTY;  Surgeon: Christena Flake, MD;  Location: [facility];  Service: Orthopedics;  Laterality: Left;   UVULOPALATOPHARYNGOPLASTY     Patient Active Problem List   Diagnosis Date Noted   Raynaud's disease 11/05/2022   Osteoporosis, post-menopausal 11/05/2022   Atrial flutter (HCC) 10/02/2022   First degree AV block 09/11/2022   Arthritis 08/08/2022   Persistent atrial fibrillation (HCC) 08/06/2022   Depression 02/27/2022   Gastroesophageal reflux disease 02/27/2022   Hypertension 02/27/2022   Abnormal LFTs 02/27/2022    Alcohol abuse 02/27/2022   Atrial fibrillation with RVR (HCC) 02/27/2022   Morbid obesity (HCC) 02/27/2022   Pes anserinus bursitis of left knee 02/08/2020   Status post total knee replacement using cement, left 09/22/2019   Status post total knee replacement using cement, right 08/12/2018   Rotator cuff rupture    Status post bilateral breast reconstruction 02/06/2013   Acquired absence of bilateral breasts and nipples 09/17/2012   Breast cancer of lower-outer quadrant of left female breast (HCC) 01/16/2011    ONSET DATE: 07/23/02  REFERRING DIAG:  Z61.0,R60.1X5A (ICD-10-CM) - Chemotherapy-induced peripheral neuropathy (HCC)   THERAPY DIAG:  Difficulty in walking, not elsewhere classified  Muscle weakness (generalized)  Pain in left foot  Pain in right foot  Rationale for Evaluation and Treatment: Rehabilitation  SUBJECTIVE:                                                                                                                                                                                             SUBJECTIVE STATEMENT: Pt reported no changes or falls since last visit. Pt reported 0/10 on NPS, but stated that she will most likely feel pain if she is on her feet for an extended period of time.    Pt accompanied by: self  PERTINENT HISTORY: Hypertension, Atrial Fibrillation, First degree AV block, Atrial flutter, Raynaud's disease, GERD, Arthritis, Obesity, Breast cancer, Depression, alcohol abuse  PAIN:  Are you having pain? No  PRECAUTIONS: None  RED FLAGS: None   WEIGHT BEARING RESTRICTIONS: No  FALLS: Has patient fallen in last 6 months? No  LIVING ENVIRONMENT: Lives with: lives with their spouse Lives in: House/apartment Stairs:  5 steps outside front, 3 steps outside back, 13 steps inside, handrails on one side on all stairs Has following equipment at home: Single point cane  PLOF: Independent  PATIENT GOALS: Improve short term memory and be  able to work in the yard efficiently  OBJECTIVE:  Note: Objective measures were completed at Evaluation unless otherwise noted.  DIAGNOSTIC FINDINGS: CT Cardiac Morph/Pulm Vein 06/02/23: 1. No acute extra cardiac abnormality. 2. Mild stable prominence of the main pulmonary arteries which may indicate degree of pulmonary arterial hypertension. 3. Aortic atherosclerosis.   COGNITION: Overall cognitive status: Within functional limits for tasks assessed   SENSATION: Not tested  COORDINATION: Not tested  EDEMA:  Not measured   MUSCLE TONE: Not tested  MUSCLE LENGTH: Not measured  DTRs:  Not evaluated  POSTURE: rounded shoulders  LOWER EXTREMITY ROM:     Active  Right Eval Left Eval  Hip flexion    Hip extension    Hip abduction    Hip adduction    Hip internal rotation    Hip external rotation    Knee flexion    Knee extension    Ankle dorsiflexion    Ankle plantarflexion    Ankle inversion    Ankle eversion     (Blank rows = not tested)  LOWER EXTREMITY MMT:    MMT Right Eval Left Eval  Hip flexion 4 4  Hip extension    Hip abduction    Hip adduction    Hip internal rotation    Hip external rotation    Knee flexion    Knee extension 5 5  Ankle dorsiflexion 4 4  Ankle plantarflexion    Ankle inversion    Ankle eversion    (Blank rows = not tested)  TRANSFERS: Assistive device utilized: None  Sit to stand: CGA Stand to sit: CGA Chair to chair: CGA  RAMP:  Ramp Comments: Not tested this visit  CURB:  Curb Comments: Not tested this vist  STAIRS:  Comments: Not observed this visit  GAIT: Gait pattern: WFL Assistive device utilized: None Level of assistance: CGA  FUNCTIONAL TESTS:  Berg Balance Scale: 43     PATIENT SURVEYS:  FOTO 76  TODAY'S TREATMENT:        Pt Continues to report of pain in B feet as the  day progresses.                                                                                                                         DATE: 06/18/2023  Manual:  Talocrural Jt mobs, Midfoot jt mobs and MTP jt mobs  TE: Calf stretch 1 x 60 secs hold each Ball roll 2 x 60 secs on each foot Heel drop on step x 2 x 60 secs  NM Standing on yellow dyna disc on NBOS  with one hand support for 3 mins Standing balance pod 1 x 60 secs each side   TA: Pt education about neuropathy and signidicance of foot Stretches every 4 to 5 hours for symptoms management.  Not Today:  Ambulation 3# AW 450 feet Hip abductions 3# AW 2x10 each LE Hip extensions 3# AW 2x10 each LE Squats with UE support 2x10     EDUCATION Education details: POC Person  educated: Patient Education method: Explanation Education comprehension: verbalized understanding   HOME EXERCISE PROGRAM: Access Code: [facility] URL: [email or web address] Date: 06/12/2023 Prepared by: Thresa Ross  Exercises - Standing Single Leg Stance with Counter Support  - 1 x daily - 7 x weekly - 3 sets - 30 second hold - Mini Squat with Counter Support  - 1 x daily - 7 x weekly - 2 sets - 10 reps  GOALS: Goals reviewed with patient? Yes  SHORT TERM GOALS: Target date: 07/12/2023     Patient will be independent in home exercise program to improve strength/mobility for better functional independence with ADLs. Baseline: Administered on 11/20  Goal status: INITIAL   LONG TERM GOALS: Target date: 08/30/2023    1.  Patient (> 68 years old) will improve five times sit to stand test by at least 3 seconds indicating an increased LE strength and improved balance. Baseline: 11/20: 14.19 Goal status: INITIAL  2.  Patient will increase FOTO score to equal to or greater than  80   to demonstrate statistically significant improvement in mobility and quality of life.  Baseline: 76 Goal status: INITIAL   3.  Patient will increase Berg Balance score by > 6 points to demonstrate decreased fall risk during functional activities. Baseline: 43 Goal  status: INITIAL   4.  Patient will reduce timed up and go by at least 3 seconds to reduce fall risk and demonstrate improved transfer/gait ability. Baseline: To be assessed next visit Goal status: INITIAL  5.  Patient will increase 10 meter walk test by at least 0.13 m/s as to improve gait speed for better community ambulation and to reduce fall risk. Baseline: To be assessed next visit Goal status: INITIAL    ASSESSMENT:  CLINICAL IMPRESSION:  Today's visit, pt introduced to Joint mobs, intrinsic foot muscle stretching, ,stretching, proprioception, strengthening, balance and pt education. Pt benefited from Jt mobs to improve ROM and tolerance to stretching and strengthening exs. Pt tol Tx well. Pt advised to use good shock absorbing foot wear indoor and outdoor to reduce pain and to perform HEP every day. Pt advised to perform stretches 3 times a day. Pt demonstrated good understanding.  Pt will continue to benefit from skilled physical therapy intervention to address impairments, improve QOL, and attain therapy goals.   OBJECTIVE IMPAIRMENTS: decreased balance and difficulty walking.   ACTIVITY LIMITATIONS: lifting, standing, and stairs  PARTICIPATION LIMITATIONS: yard work  PERSONAL FACTORS: 1 comorbidity: previous dx of breast cancer  are also affecting patient's functional outcome.   REHAB POTENTIAL: Good  CLINICAL DECISION MAKING: Stable/uncomplicated  EVALUATION COMPLEXITY: Low  PLAN:  PT FREQUENCY: 2x/week  PT DURATION: 12 weeks  PLANNED INTERVENTIONS: 97164- PT Re-evaluation, 97110-Therapeutic exercises, 97530- Therapeutic activity, 97112- Neuromuscular re-education, 97535- Self Care, 16109- Manual therapy, [phone]- Gait training, Balance training, Stair training, Cryotherapy, and Moist heat  PLAN FOR NEXT SESSION: - ensure SAFE speed, TUG: ensure pt performs correctly on first rep , LE strengthening, balance training, gait training, stair training  Krystal [name] PT  DPT 11:30 AM,06/18/23  Krystal [name] Jaci Standard PT ,DPT Physical Therapist- [place]  [facility]

## 2023-06-19 DIAGNOSIS — S161XXA Strain of muscle, fascia and tendon at neck level, initial encounter: Secondary | ICD-10-CM | POA: Diagnosis not present

## 2023-06-19 NOTE — Telephone Encounter (Signed)
Krystal [name] confirmed that patient is taking Diltiazem 120 mg BID.  Will update in patient's chart.

## 2023-06-19 NOTE — Telephone Encounter (Addendum)
Thank you!  Could you confirm the dose?  Request is for 240 mg but entered in chart as 120 mg

## 2023-06-19 NOTE — Telephone Encounter (Signed)
Spoke to patient and she is unsure of exact dosing on Diltiazem.  She requested I call her husband on home phone.  Left message for Mr. Guyot to return call for clarification.

## 2023-06-25 ENCOUNTER — Ambulatory Visit: Payer: Medicare HMO | Attending: Adult Health | Admitting: Physical Therapy

## 2023-06-25 ENCOUNTER — Ambulatory Visit: Payer: Medicare HMO | Admitting: Occupational Therapy

## 2023-06-25 ENCOUNTER — Telehealth: Payer: Self-pay | Admitting: Physical Therapy

## 2023-06-25 DIAGNOSIS — M79672 Pain in left foot: Secondary | ICD-10-CM | POA: Insufficient documentation

## 2023-06-25 DIAGNOSIS — R278 Other lack of coordination: Secondary | ICD-10-CM | POA: Insufficient documentation

## 2023-06-25 DIAGNOSIS — M79671 Pain in right foot: Secondary | ICD-10-CM | POA: Insufficient documentation

## 2023-06-25 DIAGNOSIS — R2689 Other abnormalities of gait and mobility: Secondary | ICD-10-CM | POA: Insufficient documentation

## 2023-06-25 DIAGNOSIS — M25542 Pain in joints of left hand: Secondary | ICD-10-CM | POA: Insufficient documentation

## 2023-06-25 DIAGNOSIS — M6281 Muscle weakness (generalized): Secondary | ICD-10-CM | POA: Insufficient documentation

## 2023-06-25 DIAGNOSIS — R262 Difficulty in walking, not elsewhere classified: Secondary | ICD-10-CM | POA: Insufficient documentation

## 2023-06-25 DIAGNOSIS — M25541 Pain in joints of right hand: Secondary | ICD-10-CM | POA: Insufficient documentation

## 2023-06-25 NOTE — Telephone Encounter (Signed)
Pt contacted via telephone and author left voice mail informing of missed appointment and informed pt of future PT appointment date and time.   Christopher Byrd PT, DPT   

## 2023-06-26 NOTE — Therapy (Signed)
[facility] at [facility] [address] [place], [place], [address] Phone: [phone]   Fax:  [phone]  Patient Details  Name: Krystal [name] MRN: [id number] Date of Birth: [birth year] Referring Provider:  Lillard Anes Cornett*  Encounter Date: 06/25/2023  Pt checked in and note opened in error, no charges or visit counts associated with this visit.   Norman Herrlich, PT 06/26/2023, 7:58 AM  [facility] at [facility] [address] [place], [place], [address] Phone: [phone]   Fax:  [phone]

## 2023-07-01 ENCOUNTER — Ambulatory Visit: Payer: Medicare HMO | Admitting: Occupational Therapy

## 2023-07-01 DIAGNOSIS — R278 Other lack of coordination: Secondary | ICD-10-CM | POA: Diagnosis not present

## 2023-07-01 DIAGNOSIS — M79671 Pain in right foot: Secondary | ICD-10-CM | POA: Diagnosis not present

## 2023-07-01 DIAGNOSIS — M6281 Muscle weakness (generalized): Secondary | ICD-10-CM

## 2023-07-01 DIAGNOSIS — M79672 Pain in left foot: Secondary | ICD-10-CM | POA: Diagnosis not present

## 2023-07-01 DIAGNOSIS — M25541 Pain in joints of right hand: Secondary | ICD-10-CM | POA: Diagnosis not present

## 2023-07-01 DIAGNOSIS — R262 Difficulty in walking, not elsewhere classified: Secondary | ICD-10-CM | POA: Diagnosis not present

## 2023-07-01 DIAGNOSIS — R2689 Other abnormalities of gait and mobility: Secondary | ICD-10-CM | POA: Diagnosis not present

## 2023-07-01 DIAGNOSIS — M25542 Pain in joints of left hand: Secondary | ICD-10-CM | POA: Diagnosis not present

## 2023-07-01 NOTE — Therapy (Signed)
[facility] [address] [place], [place], [address] Phone: [phone]   Fax:  [phone]  Occupational Therapy Treatment  Patient Details  Name: Krystal [name] MRN: [id number] Date of Birth: Dec 24, 1950 Referring Provider (OT): DR Allena Katz   Encounter Date: 07/01/2023   OT End of Session - 07/01/23 1548     Visit Number 8    Number of Visits 13    Date for OT Re-Evaluation 07/23/23    OT Start Time 1430    OT Stop Time 1530    OT Time Calculation (min) 60 min    Activity Tolerance Patient tolerated treatment well    Behavior During Therapy [facility] for tasks assessed/performed             Past Medical History:  Diagnosis Date   Anemia    Arthritis    osteo   Blindness of right eye at birth   Cancer [facility]) 2011   Left breast 2011 and cervical age 34   Carpal tunnel syndrome    Cataract    Depression    Edema 07/27/2011   Fibromyalgia    muscle weakness and pain   GERD (gastroesophageal reflux disease)    Hyperlipidemia    Hypertension    benign   Lymphedema    Osteoporosis    Plantar fasciitis    Pre-diabetes    Raynaud's disease    Restless leg syndrome    Rosacea    Rotator cuff rupture    Sleep apnea    Synovitis    Ulcer     Past Surgical History:  Procedure Laterality Date   ABDOMINAL HYSTERECTOMY  1986   For bleeding and pain   ATRIAL FIBRILLATION ABLATION N/A 05/14/2023   Procedure: ATRIAL FIBRILLATION ABLATION;  Surgeon: Lanier Prude, MD;  [facility];  Service: Cardiovascular;  Laterality: N/A;   BLADDER SURGERY  2006   Bladder tack   BREAST RECONSTRUCTION Bilateral 09/17/2012   Procedure: BREAST RECONSTRUCTION;  Surgeon: Wayland Denis, DO;  Location: [facility];  Service: Plastics;  Laterality: Bilateral;  BILATERAL BREAST RECONSTRUCTION WITH TISSUE EXPANDERS AND ALLOMED   BREAST RECONSTRUCTION Right 06/03/2013   Procedure: RIGHT BREAST CAPSULE  CONSTRACTURE;  Surgeon: Wayland Denis, DO;  Location: [facility];  Service: Plastics;  Laterality: Right;   CARDIOVERSION N/A 08/06/2022   Procedure: CARDIOVERSION;  Surgeon: Iran Ouch, MD;  Location: [facility];  Service: Cardiovascular;  Laterality: N/A;   CARDIOVERSION N/A 10/02/2022   Procedure: CARDIOVERSION;  Surgeon: Yvonne Kendall, MD;  Location: [facility];  Service: Cardiovascular;  Laterality: N/A;   CARPAL TUNNEL RELEASE Bilateral 2008   EYE SURGERY Right    INJECTION KNEE Left 08/12/2018   Procedure: KNEE INJECTION-LEFT;  Surgeon: Christena Flake, MD;  Location: [facility];  Service: Orthopedics;  Laterality: Left;   JOINT REPLACEMENT     LIPOSUCTION Bilateral 01/29/2013   Procedure: LIPOSUCTION;  Surgeon: Wayland Denis, DO;  Location: [facility];  Service: Plastics;  Laterality: Bilateral;   LIPOSUCTION Right 06/03/2013   Procedure: LIPOSUCTION;  Surgeon: Wayland Denis, DO;  Location: [facility];  Service: Plastics;  Laterality: Right;   MASTECTOMY  2012   rt prophalactic mast-snbx   MASTECTOMY MODIFIED RADICAL  2012   left-axillary nodes   NEUROMA SURGERY Bilateral    NOSE SURGERY  2007   PORT-A-CATH REMOVAL     insertion and   THROAT SURGERY  TONSILLECTOMY     TOTAL KNEE ARTHROPLASTY Right 08/12/2018   Procedure: TOTAL KNEE ARTHROPLASTY-RIGHT;  Surgeon: Christena Flake, MD;  Location: [facility];  Service: Orthopedics;  Laterality: Right;   TOTAL KNEE ARTHROPLASTY Left 09/22/2019   Procedure: TOTAL KNEE ARTHROPLASTY;  Surgeon: Christena Flake, MD;  Location: [facility];  Service: Orthopedics;  Laterality: Left;   UVULOPALATOPHARYNGOPLASTY      There were no vitals filed for this visit.   Subjective Assessment - 07/01/23 1545     Subjective  My hands been burning and hurting so much -and feel swollen over the knuckles -but you will be proud and did say no to somethings - and organizations- Since I see you last did do the flowers for  my Dad and MIL graves - a lot of flowers    Pertinent History 02/11/23 Dr Allena Katz note: SAMIJO GOVERN is a 72 y.o. female is here today for follow up of osteoarthritis. The patient's allergies, current medications, past family history, past medical history, past social history, past surgical history and problem list were reviewed and updated as appropriate.  She continues to have pain of the hand joints. She is taking Lyrica, Cymbalta and Plaquenil. She is not able to do things with her hands anymore. She has noticed some swelling of the hand joints. She has been offered [facility] surgery but she declined.Assessment and Plan  Osteoarthritis of multiple joints: active -- Unable to take NSAIDs due to being on blood thinners -- She states that Tramadol did not help. -- She states that Tylenol made her liver enzymes increase. -- Failed Plaquenil -- Continue Cymbalta. -- Trial of Methotrexate 10 mg weekly and Folic Acid 1 mg Daily -- Refer to OT  2. Fibromyalgia -- She is not taking gabapentin. -- Continue Lyrica 300 mg/day -- She continues to stay active.  3. Long term use of high risk medication -- Methotrexate is    Patient Stated Goals I don't want surgery - and want my pain in thumbs and fingers better so I can do things around the house, yard work, crafts, crochet, ,knit- and exercise classes    Currently in Pain? Yes    Pain Score 10-Worst pain ever    Pain Location Hand    Pain Orientation Right;Left    Pain Descriptors / Indicators Aching;Tightness;Burning    Pain Type Chronic pain    Pain Onset More than a month ago                [facility] OT Assessment - 07/01/23 0001       AROM   Right Wrist Extension 55 Degrees    Right Wrist Flexion 85 Degrees    Left Wrist Extension 65 Degrees    Left Wrist Flexion 80 Degrees      Strength   Right Hand Grip (lbs) 25    Right Hand Lateral Pinch 8 lbs    Right Hand 3 Point Pinch 5 lbs    Left Hand Grip (lbs) 18    Left Hand Lateral Pinch 11 lbs    Left  Hand 3 Point Pinch 5 lbs      Right Hand AROM   R Thumb Radial ABduction/ADduction 0-55 55    R Thumb Palmar ABduction/ADduction 0-45 55    R Index  MCP 0-90 80 Degrees    R Index PIP 0-100 90 Degrees    R Long  MCP 0-90 80 Degrees    R Long PIP 0-100 95 Degrees  R Ring  MCP 0-90 80 Degrees    R Ring PIP 0-100 95 Degrees    R Little  MCP 0-90 85 Degrees    R Little PIP 0-100 90 Degrees      Left Hand AROM   L Thumb Radial ADduction/ABduction 0-55 45    L Thumb Palmar ADduction/ABduction 0-45 50    L Index  MCP 0-90 75 Degrees    L Index PIP 0-100 90 Degrees    L Long  MCP 0-90 80 Degrees    L Long PIP 0-100 95 Degrees    L Ring  MCP 0-90 75 Degrees    L Ring PIP 0-100 90 Degrees    L Little  MCP 0-90 85 Degrees    L Little PIP 0-100 95 Degrees             Patient arrive with increased inflammation in bilateral hands.  Increased pain over metacarpals as well as second and third DIPs. Increased tenderness over left third and fourth A1 pulleys and right fourth Pain 10/10 reported in bilateral hands. Decreased grip and prehension strength with increased pain Patient was seen about 3 weeks ago. Patient do report she did make some changes in giving away some of her iron skillet's and pots reorganizing her kitchen to be easier on her hands Not volunteering as much for organizations anymore But appear patient did all the flowers for her dad's funeral in the last 3 weeks involving a lot of activity with the hands.       Skin check done prior.  Patient tolerating ionto well.  Reported feeling better with less pain after modalities.   OT Treatments/Exercises (OP) - 07/01/23 0001       Iontophoresis   Type of Iontophoresis Dexamethasone    Location R 4th , L 3rd and 4th A1pulley    Dose R small, L med patch 2.0 current    Time 19      RUE Fluidotherapy   Number Minutes Fluidotherapy 10 Minutes    RUE Fluidotherapy Location Hand;Wrist    Comments 2 rotastionso ice -  decrease edema      LUE Fluidotherapy   Number Minutes Fluidotherapy 10 Minutes    LUE Fluidotherapy Location Wrist;Hand    Comments 2 rotations of ice - decrease edema             REINFORCE AGAIN getting to aquatics- flareup OA education  Patient did start tai chi last week and coming to the session this week again And review again contrast to do in the morning in the afternoon  And tendon glides gentle AROM  10 reps Continue with isotoner gloves         OT Education - 07/01/23 1548     Education Details Ionto use - progress and changes to HEP    Person(s) Educated Patient    Methods Explanation;Demonstration;Tactile cues;Verbal cues;Handout    Comprehension Verbal cues required;Returned demonstration;Verbalized understanding                 OT Long Term Goals - 06/11/23 1830       OT LONG TERM GOAL #1   Title Pt to be independent in HEP to decrease pain to less than 5/10 while maintain AROM in bilateral hands    Baseline AROM in digits WNL - thumb decrease opposition and PA and RA - pain 10/10 R hand , L 8/10 - tenderness over several A1pulley's Sept decreease -but now flare up again after Dad  past away and was not seen for about 2 months    Time 4    Period Weeks    Status On-going    Target Date 07/09/23      OT LONG TERM GOAL #2   Title Pt to verbalize 3 joint protection and AE to decrease pain and increase ease of use of hands with less pain    Baseline Pt did very well with AE and modifications but since not seen 2 months and Dad past away - having flareup again    Time 4    Period Weeks    Status On-going    Target Date 07/09/23      OT LONG TERM GOAL #3   Title Pt show AROM WFL in bilateral hands with digits -  pain less than 4/10 during ADL's    Baseline R hand pain 10/10 , L 8/10 and  AROM WFL except thumb opposition, PA and RA NOW pain again  increase to 8-10/10 in bilateral hands including thumbs    Time 6    Period Weeks    Status  On-going    Target Date 07/23/23      OT LONG TERM GOAL #4   Title Pt to impliment lifestyle changes to increase time for self to do exercise and family to be more independent    Baseline Use to do aquitics exercises in water 8 months ago- help dad in everything now bathing, eating, dressing - more than 75 % - help husband - dad has aid 20 hrs week - carry and unpack self groceries - and prep and cook SEPT  dad in ass living , and started  water exercise clasess , and modifying activities- and taking care of herself  NOW dad past away was out of town prior not been to [employer], over doing yard work , and HEP - increase pain again 10/10    Time 6    Period Weeks    Status On-going    Target Date 07/23/23                   Plan - 07/01/23 1552     Clinical Impression Statement Pt refer In July by Dr Allena Katz with pain in bilateral hands - OA and changes to bilateral hands , all digits and joints -pt was seen for 7 visits -and pain at rest decrease to 2/10 in bilateral hands - she was doing really well with modifications of tasks and AE use - Also doing modalities to decrease stiffness and pain - Pt was out of town at [employer] and then her Dad past away and was not seen from Sept to middle Nov-and then did not see pt for about 3 wks - had funeral and pt return this date again with increase pain , edema, stiffness and decrease strength in bilateral hands compare to in the past - pain coming in today 101/ 0- and increase tenderness again in R 4th and L  3rd and 4th digits on bilatral hands triggering and increase tenderness- Her grip and prehension strenght decrease greatly compare to Sept - and pt appear to have flare up in her OA - with joint changes at DIP's and PIP's - increase edema and pain- Do appear pt has done some changes in her activities, priorities, saying No- but done all the flowers for the funeral -  REview again with pt HEP , gentle pain free AROM - modifications and pacing herself  - Done IOnto  with dexamethazone today -and will see pt one more time prior to she going out of town -  Pt can benefit from skilled OT services to decrease pain , edema-increase strength while reviewing again modifcations and AE to decrease pain and increase ease of use of bilateral hands in ADL's and IADL's.    OT Occupational Profile and History Problem Focused Assessment - Including review of records relating to presenting problem    Occupational performance deficits (Please refer to evaluation for details): ADL's;IADL's;Leisure;Social Participation;Play;Rest and Sleep    Body Structure / Function / Physical Skills ADL;Coordination;IADL;Pain;Strength;Edema;FMC;Decreased knowledge of use of DME;Flexibility;ROM;UE functional use    Rehab Potential Fair    Clinical Decision Making Several treatment options, min-mod task modification necessary    Modification or Assistance to Complete Evaluation  No modification of tasks or assist necessary to complete eval    OT Frequency 1x / week    OT Duration 6 weeks    OT Treatment/Interventions Self-care/ADL training;Therapeutic exercise;Patient/family education;Splinting;Fluidtherapy;Contrast Bath;Ultrasound;DME and/or AE instruction;Manual Therapy;Passive range of motion;Paraffin;Iontophoresis    Consulted and Agree with Plan of Care Patient             Patient will benefit from skilled therapeutic intervention in order to improve the following deficits and impairments:   Body Structure / Function / Physical Skills: ADL, Coordination, IADL, Pain, Strength, Edema, FMC, Decreased knowledge of use of DME, Flexibility, ROM, UE functional use       Visit Diagnosis: Joint pain in both hands  Other lack of coordination  Muscle weakness (generalized)    Problem List Patient Active Problem List   Diagnosis Date Noted   Raynaud's disease 11/05/2022   Osteoporosis, post-menopausal 11/05/2022   Atrial flutter (HCC) 10/02/2022   First degree AV block  09/11/2022   Arthritis 08/08/2022   Persistent atrial fibrillation (HCC) 08/06/2022   Depression 02/27/2022   Gastroesophageal reflux disease 02/27/2022   Hypertension 02/27/2022   Abnormal LFTs 02/27/2022   Alcohol abuse 02/27/2022   Atrial fibrillation with RVR (HCC) 02/27/2022   Morbid obesity (HCC) 02/27/2022   Pes anserinus bursitis of left knee 02/08/2020   Status post total knee replacement using cement, left 09/22/2019   Status post total knee replacement using cement, right 08/12/2018   Rotator cuff rupture    Status post bilateral breast reconstruction 02/06/2013   Acquired absence of bilateral breasts and nipples 09/17/2012   Breast cancer of lower-outer quadrant of left female breast (HCC) 01/16/2011    Oletta Cohn, OTR/L,CLT 07/01/2023, 5:03 PM  [facility] [address], [place], [address] Phone: [phone]   Fax:  [phone]  Name: Krystal [name] MRN: [id number] Date of Birth: [birth year]

## 2023-07-02 ENCOUNTER — Ambulatory Visit: Payer: Medicare HMO | Admitting: Physical Therapy

## 2023-07-03 ENCOUNTER — Ambulatory Visit: Payer: Medicare HMO | Admitting: Physical Therapy

## 2023-07-03 DIAGNOSIS — R2689 Other abnormalities of gait and mobility: Secondary | ICD-10-CM | POA: Diagnosis not present

## 2023-07-03 DIAGNOSIS — R262 Difficulty in walking, not elsewhere classified: Secondary | ICD-10-CM | POA: Diagnosis not present

## 2023-07-03 DIAGNOSIS — M25542 Pain in joints of left hand: Secondary | ICD-10-CM | POA: Diagnosis not present

## 2023-07-03 DIAGNOSIS — M25541 Pain in joints of right hand: Secondary | ICD-10-CM | POA: Diagnosis not present

## 2023-07-03 DIAGNOSIS — M6281 Muscle weakness (generalized): Secondary | ICD-10-CM

## 2023-07-03 DIAGNOSIS — M79671 Pain in right foot: Secondary | ICD-10-CM | POA: Diagnosis not present

## 2023-07-03 DIAGNOSIS — R278 Other lack of coordination: Secondary | ICD-10-CM | POA: Diagnosis not present

## 2023-07-03 DIAGNOSIS — M79672 Pain in left foot: Secondary | ICD-10-CM | POA: Diagnosis not present

## 2023-07-03 NOTE — Therapy (Unsigned)
OUTPATIENT PHYSICAL THERAPY NEURO EVALUATION   Krystal Krystal: Krystal Krystal MRN: [id number] DOB:[birth year], 72 y.o., female Today's Date: 07/04/2023   PCP: Jerl Mina, MD REFERRING PROVIDER: Loa Socks, NP  END OF SESSION:  PT End of Session - 07/03/23 1647     Visit Number 4    Number of Visits 24    Progress Note Due on Visit 10    PT Start Time 1447    PT Stop Time 1528    PT Time Calculation (min) 41 min    Equipment Utilized During Treatment Gait belt    Activity Tolerance Krystal tolerated treatment well               Past Medical History:  Diagnosis Date   Anemia    Arthritis    osteo   Blindness of right eye at birth   Cancer [facility]) 2011   Left breast 2011 and cervical age 14   Carpal tunnel syndrome    Cataract    Depression    Edema 07/27/2011   Fibromyalgia    muscle weakness and pain   GERD (gastroesophageal reflux disease)    Hyperlipidemia    Hypertension    benign   Lymphedema    Osteoporosis    Plantar fasciitis    Pre-diabetes    Raynaud's disease    Restless leg syndrome    Rosacea    Rotator cuff rupture    Sleep apnea    Synovitis    Ulcer    Past Surgical History:  Procedure Laterality Date   ABDOMINAL HYSTERECTOMY  1986   For bleeding and pain   ATRIAL FIBRILLATION ABLATION N/A 05/14/2023   Procedure: ATRIAL FIBRILLATION ABLATION;  Surgeon: Lanier Prude, MD;  Location: [facility];  Service: Cardiovascular;  Laterality: N/A;   BLADDER SURGERY  2006   Bladder tack   BREAST RECONSTRUCTION Bilateral 09/17/2012   Procedure: BREAST RECONSTRUCTION;  Surgeon: Wayland Denis, DO;  Location: [facility];  Service: Plastics;  Laterality: Bilateral;  BILATERAL BREAST RECONSTRUCTION WITH TISSUE EXPANDERS AND ALLOMED   BREAST RECONSTRUCTION Right 06/03/2013   Procedure: RIGHT BREAST CAPSULE CONSTRACTURE;  Surgeon: Wayland Denis, DO;  Location: [facility];  Service: Plastics;   Laterality: Right;   CARDIOVERSION N/A 08/06/2022   Procedure: CARDIOVERSION;  Surgeon: Iran Ouch, MD;  Location: [facility];  Service: Cardiovascular;  Laterality: N/A;   CARDIOVERSION N/A 10/02/2022   Procedure: CARDIOVERSION;  Surgeon: Yvonne Kendall, MD;  Location: [facility];  Service: Cardiovascular;  Laterality: N/A;   CARPAL TUNNEL RELEASE Bilateral 2008   EYE SURGERY Right    INJECTION KNEE Left 08/12/2018   Procedure: KNEE INJECTION-LEFT;  Surgeon: Christena Flake, MD;  Location: [facility];  Service: Orthopedics;  Laterality: Left;   JOINT REPLACEMENT     LIPOSUCTION Bilateral 01/29/2013   Procedure: LIPOSUCTION;  Surgeon: Wayland Denis, DO;  Location: [facility];  Service: Plastics;  Laterality: Bilateral;   LIPOSUCTION Right 06/03/2013   Procedure: LIPOSUCTION;  Surgeon: Wayland Denis, DO;  Location: [facility];  Service: Plastics;  Laterality: Right;   MASTECTOMY  2012   rt prophalactic mast-snbx   MASTECTOMY MODIFIED RADICAL  2012   left-axillary nodes   NEUROMA SURGERY Bilateral    NOSE SURGERY  2007   PORT-A-CATH REMOVAL     insertion and   THROAT SURGERY     TONSILLECTOMY     TOTAL KNEE ARTHROPLASTY Right 08/12/2018   Procedure:  TOTAL KNEE ARTHROPLASTY-RIGHT;  Surgeon: Christena Flake, MD;  Location: [facility];  Service: Orthopedics;  Laterality: Right;   TOTAL KNEE ARTHROPLASTY Left 09/22/2019   Procedure: TOTAL KNEE ARTHROPLASTY;  Surgeon: Christena Flake, MD;  Location: [facility];  Service: Orthopedics;  Laterality: Left;   UVULOPALATOPHARYNGOPLASTY     Krystal Active Problem List   Diagnosis Date Noted   Raynaud's disease 11/05/2022   Osteoporosis, post-menopausal 11/05/2022   Atrial flutter (HCC) 10/02/2022   First degree AV block 09/11/2022   Arthritis 08/08/2022   Persistent atrial fibrillation (HCC) 08/06/2022   Depression 02/27/2022   Gastroesophageal reflux disease 02/27/2022   Hypertension 02/27/2022   Abnormal LFTs 02/27/2022    Alcohol abuse 02/27/2022   Atrial fibrillation with RVR (HCC) 02/27/2022   Morbid obesity (HCC) 02/27/2022   Pes anserinus bursitis of left knee 02/08/2020   Status post total knee replacement using cement, left 09/22/2019   Status post total knee replacement using cement, right 08/12/2018   Rotator cuff rupture    Status post bilateral breast reconstruction 02/06/2013   Acquired absence of bilateral breasts and nipples 09/17/2012   Breast cancer of lower-outer quadrant of left female breast (HCC) 01/16/2011    ONSET DATE: 07/23/02  REFERRING DIAG:  Z61.0,R60.1X5A (ICD-10-CM) - Chemotherapy-induced peripheral neuropathy (HCC)   THERAPY DIAG:  No diagnosis found.  Rationale for Evaluation and Treatment: Rehabilitation  SUBJECTIVE:                                                                                                                                                                                             SUBJECTIVE STATEMENT: Pt reported no changes or falls since last visit. She does report after her visit next week she will be at the beach for a few weeks and would like an advanced Hep to work on when she is at [employer]. Pt instructed this would be provided in Friday's or Tuesday's session prior to her departing for the beach.    Pt accompanied by: self  PERTINENT HISTORY: Hypertension, Atrial Fibrillation, First degree AV block, Atrial flutter, Raynaud's disease, GERD, Arthritis, Obesity, Breast cancer, Depression, alcohol abuse  PAIN:  Are you having pain? No  PRECAUTIONS: None  RED FLAGS: None   WEIGHT BEARING RESTRICTIONS: No  FALLS: Has Krystal fallen in last 6 months? No  LIVING ENVIRONMENT: Lives with: lives with their spouse Lives in: House/apartment Stairs:  5 steps outside front, 3 steps outside back, 13 steps inside, handrails on one side on all stairs Has following equipment at home: Single point cane  PLOF: Independent  Krystal GOALS:  Improve short term memory  and be able to work in the yard efficiently  OBJECTIVE:  Note: Objective measures were completed at Evaluation unless otherwise noted.  DIAGNOSTIC FINDINGS: CT Cardiac Morph/Pulm Vein 06/02/23: 1. No acute extra cardiac abnormality. 2. Mild stable prominence of the main pulmonary arteries which may indicate degree of pulmonary arterial hypertension. 3. Aortic atherosclerosis.   COGNITION: Overall cognitive status: Within functional limits for tasks assessed   SENSATION: Not tested  COORDINATION: Not tested  EDEMA:  Not measured   MUSCLE TONE: Not tested  MUSCLE LENGTH: Not measured  DTRs:  Not evaluated  POSTURE: rounded shoulders  LOWER EXTREMITY ROM:     Active  Right Eval Left Eval  Hip flexion    Hip extension    Hip abduction    Hip adduction    Hip internal rotation    Hip external rotation    Knee flexion    Knee extension    Ankle dorsiflexion    Ankle plantarflexion    Ankle inversion    Ankle eversion     (Blank rows = not tested)  LOWER EXTREMITY MMT:    MMT Right Eval Left Eval  Hip flexion 4 4  Hip extension    Hip abduction    Hip adduction    Hip internal rotation    Hip external rotation    Knee flexion    Knee extension 5 5  Ankle dorsiflexion 4 4  Ankle plantarflexion    Ankle inversion    Ankle eversion    (Blank rows = not tested)  TRANSFERS: Assistive device utilized: None  Sit to stand: CGA Stand to sit: CGA Chair to chair: CGA  RAMP:  Ramp Comments: Not tested this visit  CURB:  Curb Comments: Not tested this vist  STAIRS:  Comments: Not observed this visit  GAIT: Gait pattern: WFL Assistive device utilized: None Level of assistance: CGA  FUNCTIONAL TESTS:  Berg Balance Scale: 43     Krystal SURVEYS:  FOTO 76  TODAY'S TREATMENT:        NMR  Activity Description: blaze taps UE and LE  Activity Setting:  focus, The Blaze Pod Focus setting was selected to refine  precision and concentration, isolating specific muscle groups or movements to enhance overall coordination and targeted muscle engagement. Number of Pods:  5 Cycles/Sets:  3 Duration (Time or Hit Count):  25 hits ea    Activity Description: blaze pod step taps  Activity Setting:  focus, The Blaze Pod Focus setting was selected to refine precision and concentration, isolating specific muscle groups or movements to enhance overall coordination and targeted muscle engagement. Number of Pods:  3 Cycles/Sets:  2 Duration (Time or Hit Count):  25 hits    TE  Assessed and TUG with pt completing at safe speed and results in goal section below.   Hip abductions 3# AW 2x10 each LE Hip extensions 3# AW 2x10 each LE Heel raises on incline x 20 reps     EDUCATION Education details: POC Person educated: Krystal Education method: Explanation Education comprehension: verbalized understanding   HOME EXERCISE PROGRAM: Access Code: [name] URL: [email or web address] Date: 06/12/2023 Prepared by: Thresa Ross  Exercises - Standing Single Leg Stance with Counter Support  - 1 x daily - 7 x weekly - 3 sets - 30 second hold - Mini Squat with Counter Support  - 1 x daily - 7 x weekly - 2 sets - 10 reps  GOALS: Goals reviewed with Krystal? Yes  SHORT TERM GOALS:  Target date: 07/12/2023     Krystal will be independent in home exercise program to improve strength/mobility for better functional independence with ADLs. Baseline: Administered on 11/20  Goal status: INITIAL   LONG TERM GOALS: Target date: 08/30/2023    1.  Krystal (> 36 years old) will improve five times sit to stand test by at least 3 seconds indicating an increased LE strength and improved balance. Baseline: 11/20: 14.19 Goal status: INITIAL  2.  Krystal will increase FOTO score to equal to or greater than  80   to demonstrate statistically significant improvement in mobility and quality of life.   Baseline: 76 Goal status: INITIAL   3.  Krystal will increase Berg Balance score by > 6 points to demonstrate decreased fall risk during functional activities. Baseline: 43 Goal status: INITIAL   4.  Krystal will reduce timed up and go by at least 3 seconds to reduce fall risk and demonstrate improved transfer/gait ability. Baseline: 10 sec on 12/11 Goal status: INITIAL  5.  Krystal will increase 10 meter walk test by at least 0.13 m/s as to improve gait speed for better community ambulation and to reduce fall risk. Baseline: 12/11:23.33 sec  Goal status: INITIAL    ASSESSMENT:  CLINICAL IMPRESSION:  Krystal arrived with good motivation form completion of pt activities. She does report after her visit next week she will be at the beach for a few weeks and would like an advanced Hep to work on when she is at [employer]. Pt instructed this would be provided in Friday's or Tuesday's session prior to her departing for the beach.Pt challenged with dynamic dual task balance with blaze pods this date. The Krystal demonstrated significant progress while utilizing [employer], showcasing improved coordination, balance, and cognitive function. The incorporation of dual-tasking technology with color recognition and association with specific movements in Blaze Pods was strategically chosen to provide a dynamic training environment, enabling the Krystal to engage in simultaneous physical and cognitive tasks. This unique approach enhances not only their physical abilities but also fosters increased neural connectivity and mental awareness, contributing to a well-rounded and effective rehabilitation and training experience. Pt will continue to benefit from skilled physical therapy intervention to address impairments, improve QOL, and attain therapy goals.     OBJECTIVE IMPAIRMENTS: decreased balance and difficulty walking.   ACTIVITY LIMITATIONS: lifting, standing, and stairs  PARTICIPATION LIMITATIONS:  yard work  PERSONAL FACTORS: 1 comorbidity: previous dx of breast cancer  are also affecting Krystal's functional outcome.   REHAB POTENTIAL: Good  CLINICAL DECISION MAKING: Stable/uncomplicated  EVALUATION COMPLEXITY: Low  PLAN:  PT FREQUENCY: 2x/week  PT DURATION: 12 weeks  PLANNED INTERVENTIONS: 97164- PT Re-evaluation, 97110-Therapeutic exercises, 97530- Therapeutic activity, 97112- Neuromuscular re-education, 97535- Self Care, 16109- Manual therapy, 97116- Gait training, Balance training, Stair training, Cryotherapy, and Moist heat  PLAN FOR NEXT SESSION: LE strengthening, balance training, gait training, stair training She does report after her visit next week she will be at the beach for a few weeks and would like an advanced Hep to work on when she is at [employer]. Pt instructed this would be provided in Friday's or Tuesday's session prior to her departing for the beach.    Norman Herrlich PT ,DPT Physical Therapist- [place]  [facility]

## 2023-07-05 ENCOUNTER — Ambulatory Visit: Payer: Medicare HMO | Admitting: Physical Therapy

## 2023-07-05 DIAGNOSIS — M25541 Pain in joints of right hand: Secondary | ICD-10-CM | POA: Diagnosis not present

## 2023-07-05 DIAGNOSIS — M79671 Pain in right foot: Secondary | ICD-10-CM

## 2023-07-05 DIAGNOSIS — M79672 Pain in left foot: Secondary | ICD-10-CM | POA: Diagnosis not present

## 2023-07-05 DIAGNOSIS — M25542 Pain in joints of left hand: Secondary | ICD-10-CM | POA: Diagnosis not present

## 2023-07-05 DIAGNOSIS — R2689 Other abnormalities of gait and mobility: Secondary | ICD-10-CM

## 2023-07-05 DIAGNOSIS — R278 Other lack of coordination: Secondary | ICD-10-CM

## 2023-07-05 DIAGNOSIS — R262 Difficulty in walking, not elsewhere classified: Secondary | ICD-10-CM | POA: Diagnosis not present

## 2023-07-05 DIAGNOSIS — M6281 Muscle weakness (generalized): Secondary | ICD-10-CM | POA: Diagnosis not present

## 2023-07-05 NOTE — Therapy (Signed)
OUTPATIENT PHYSICAL THERAPY NEURO EVALUATION   Patient Name: Krystal [name] MRN: [id number] DOB:23-Apr-1951, 72 y.o., female Today's Date: 07/05/2023   PCP: Jerl Mina, MD REFERRING PROVIDER: Loa Socks, NP  END OF SESSION:  PT End of Session - 07/05/23 0756     Visit Number 5    Number of Visits 24    Progress Note Due on Visit 10    PT Start Time 0802    PT Stop Time 0840    PT Time Calculation (min) 38 min    Equipment Utilized During Treatment Gait belt    Activity Tolerance Patient tolerated treatment well               Past Medical History:  Diagnosis Date   Anemia    Arthritis    osteo   Blindness of right eye at birth   Cancer [facility]) 2011   Left breast 2011 and cervical age 88   Carpal tunnel syndrome    Cataract    Depression    Edema 07/27/2011   Fibromyalgia    muscle weakness and pain   GERD (gastroesophageal reflux disease)    Hyperlipidemia    Hypertension    benign   Lymphedema    Osteoporosis    Plantar fasciitis    Pre-diabetes    Raynaud's disease    Restless leg syndrome    Rosacea    Rotator cuff rupture    Sleep apnea    Synovitis    Ulcer    Past Surgical History:  Procedure Laterality Date   ABDOMINAL HYSTERECTOMY  1986   For bleeding and pain   ATRIAL FIBRILLATION ABLATION N/A 05/14/2023   Procedure: ATRIAL FIBRILLATION ABLATION;  Surgeon: Lanier Prude, MD;  [facility];  Service: Cardiovascular;  Laterality: N/A;   BLADDER SURGERY  2006   Bladder tack   BREAST RECONSTRUCTION Bilateral 09/17/2012   Procedure: BREAST RECONSTRUCTION;  Surgeon: Wayland Denis, DO;  Location: [facility];  Service: Plastics;  Laterality: Bilateral;  BILATERAL BREAST RECONSTRUCTION WITH TISSUE EXPANDERS AND ALLOMED   BREAST RECONSTRUCTION Right 06/03/2013   Procedure: RIGHT BREAST CAPSULE CONSTRACTURE;  Surgeon: Wayland Denis, DO;  Location: [facility];  Service: Plastics;   Laterality: Right;   CARDIOVERSION N/A 08/06/2022   Procedure: CARDIOVERSION;  Surgeon: Iran Ouch, MD;  Location: [facility];  Service: Cardiovascular;  Laterality: N/A;   CARDIOVERSION N/A 10/02/2022   Procedure: CARDIOVERSION;  Surgeon: Yvonne Kendall, MD;  Location: [facility];  Service: Cardiovascular;  Laterality: N/A;   CARPAL TUNNEL RELEASE Bilateral 2008   EYE SURGERY Right    INJECTION KNEE Left 08/12/2018   Procedure: KNEE INJECTION-LEFT;  Surgeon: Christena Flake, MD;  Location: [facility];  Service: Orthopedics;  Laterality: Left;   JOINT REPLACEMENT     LIPOSUCTION Bilateral 01/29/2013   Procedure: LIPOSUCTION;  Surgeon: Wayland Denis, DO;  Location: [facility];  Service: Plastics;  Laterality: Bilateral;   LIPOSUCTION Right 06/03/2013   Procedure: LIPOSUCTION;  Surgeon: Wayland Denis, DO;  Location: [facility];  Service: Plastics;  Laterality: Right;   MASTECTOMY  2012   rt prophalactic mast-snbx   MASTECTOMY MODIFIED RADICAL  2012   left-axillary nodes   NEUROMA SURGERY Bilateral    NOSE SURGERY  2007   PORT-A-CATH REMOVAL     insertion and   THROAT SURGERY     TONSILLECTOMY     TOTAL KNEE ARTHROPLASTY Right 08/12/2018   Procedure:  TOTAL KNEE ARTHROPLASTY-RIGHT;  Surgeon: Christena Flake, MD;  Location: [facility];  Service: Orthopedics;  Laterality: Right;   TOTAL KNEE ARTHROPLASTY Left 09/22/2019   Procedure: TOTAL KNEE ARTHROPLASTY;  Surgeon: Christena Flake, MD;  Location: [facility];  Service: Orthopedics;  Laterality: Left;   UVULOPALATOPHARYNGOPLASTY     Patient Active Problem List   Diagnosis Date Noted   Raynaud's disease 11/05/2022   Osteoporosis, post-menopausal 11/05/2022   Atrial flutter (HCC) 10/02/2022   First degree AV block 09/11/2022   Arthritis 08/08/2022   Persistent atrial fibrillation (HCC) 08/06/2022   Depression 02/27/2022   Gastroesophageal reflux disease 02/27/2022   Hypertension 02/27/2022   Abnormal LFTs 02/27/2022    Alcohol abuse 02/27/2022   Atrial fibrillation with RVR (HCC) 02/27/2022   Morbid obesity (HCC) 02/27/2022   Pes anserinus bursitis of left knee 02/08/2020   Status post total knee replacement using cement, left 09/22/2019   Status post total knee replacement using cement, right 08/12/2018   Rotator cuff rupture    Status post bilateral breast reconstruction 02/06/2013   Acquired absence of bilateral breasts and nipples 09/17/2012   Breast cancer of lower-outer quadrant of left female breast (HCC) 01/16/2011    ONSET DATE: 07/23/02  REFERRING DIAG:  I69.6,E95.1X5A (ICD-10-CM) - Chemotherapy-induced peripheral neuropathy (HCC)   THERAPY DIAG:  Difficulty in walking, not elsewhere classified  Other abnormalities of gait and mobility  Muscle weakness (generalized)  Other lack of coordination  Pain in left foot  Pain in right foot  Rationale for Evaluation and Treatment: Rehabilitation  SUBJECTIVE:                                                                                                                                                                                             SUBJECTIVE STATEMENT: Pt reported no changes or falls since last visit. Pt reports that she is doing well. No pain at start of session, but states that she will probably have pain by the end of the day with all the shopping she will be doing all day today.    Pt accompanied by: self  PERTINENT HISTORY: Hypertension, Atrial Fibrillation, First degree AV block, Atrial flutter, Raynaud's disease, GERD, Arthritis, Obesity, Breast cancer, Depression, alcohol abuse  PAIN:  Are you having pain? No  PRECAUTIONS: None  RED FLAGS: None   WEIGHT BEARING RESTRICTIONS: No  FALLS: Has patient fallen in last 6 months? No  LIVING ENVIRONMENT: Lives with: lives with their spouse Lives in: House/apartment Stairs:  5 steps outside front, 3 steps outside back, 13 steps inside, handrails on one side on all  stairs Has following  equipment at home: Single point cane  PLOF: Independent  PATIENT GOALS: Improve short term memory and be able to work in the yard efficiently  OBJECTIVE:  Note: Objective measures were completed at Evaluation unless otherwise noted.  DIAGNOSTIC FINDINGS: CT Cardiac Morph/Pulm Vein 06/02/23: 1. No acute extra cardiac abnormality. 2. Mild stable prominence of the main pulmonary arteries which may indicate degree of pulmonary arterial hypertension. 3. Aortic atherosclerosis.   COGNITION: Overall cognitive status: Within functional limits for tasks assessed   SENSATION: Not tested  COORDINATION: Not tested  EDEMA:  Not measured   MUSCLE TONE: Not tested  MUSCLE LENGTH: Not measured  DTRs:  Not evaluated  POSTURE: rounded shoulders  LOWER EXTREMITY ROM:     Active  Right Eval Left Eval  Hip flexion    Hip extension    Hip abduction    Hip adduction    Hip internal rotation    Hip external rotation    Knee flexion    Knee extension    Ankle dorsiflexion    Ankle plantarflexion    Ankle inversion    Ankle eversion     (Blank rows = not tested)  LOWER EXTREMITY MMT:    MMT Right Eval Left Eval  Hip flexion 4 4  Hip extension    Hip abduction    Hip adduction    Hip internal rotation    Hip external rotation    Knee flexion    Knee extension 5 5  Ankle dorsiflexion 4 4  Ankle plantarflexion    Ankle inversion    Ankle eversion    (Blank rows = not tested)  TRANSFERS: Assistive device utilized: None  Sit to stand: CGA Stand to sit: CGA Chair to chair: CGA  RAMP:  Ramp Comments: Not tested this visit  CURB:  Curb Comments: Not tested this vist  STAIRS:  Comments: Not observed this visit  GAIT: Gait pattern: WFL Assistive device utilized: None Level of assistance: CGA  FUNCTIONAL TESTS:  Berg Balance Scale: 43     PATIENT SURVEYS:  FOTO 76  TODAY'S TREATMENT:        Nustep BLE/BUE level 1-3 x 5 min cues  for proper ROM to reduce pain in bil hands and shoulders.   Standing on airex pad:  BLE normal BOS 2 x 45 sec  1 foot on pad, 1 foot on 6 inch step 2 x 20 sec bil  Foot tap on 6 inch step x 12 bil  Lateral foot tap on 6 inch step x 12 bil   Step over hurdle x 12 bil. Difficulty with reciprocal pattern.   Seated therex:  Hip abduction x 12 RTB  Ankle PF x 15 RTB  Ankle eversion x 15 bil RTB  Seated AROM flexion/abduction over bolster x 12 bil  Standing therex: Standing hip abduction x 12  Standing hip extension x 12   Gait without resistance x 272ft no overt gait deviations.   CGA for safety with stance on airex pad for safety and improved weight shift R and L with tap on 6 inch step to reduce fall risk.    EDUCATION Education details: POC Person educated: Patient Education method: Explanation Education comprehension: verbalized understanding   HOME EXERCISE PROGRAM: Access Code: [facility] URL: [email or web address] Date: 07/05/2023 Prepared by: Grier Rocher  Exercises - Standing Single Leg Stance with Counter Support  - 1 x daily - 7 x weekly - 3 sets - 30 second hold - Mini Squat with Counter Support  -  1 x daily - 4 x weekly - 2 sets - 10 reps - Standing Hip Abduction with Counter Support  - 1 x daily - 4 x weekly - 2 sets - 10 reps - 2 hold - Standing Hip Extension with Counter Support  - 1 x daily - 4 x weekly - 2 sets - 10 reps - 2 hold - Seated Hip Abduction with Resistance  - 1 x daily - 4 x weekly - 3 sets - 10 reps - Seated Knee Lifts with Resistance  - 1 x daily - 4 x weekly - 3 sets - 10 reps - Seated Ankle Plantarflexion with Resistance  - 1 x daily - 4 x weekly - 3 sets - 10 reps - Seated Ankle Eversion with Resistance  - 1 x daily - 4 x weekly - 3 sets - 10 reps  GOALS: Goals reviewed with patient? Yes  SHORT TERM GOALS: Target date: 07/12/2023     Patient will be independent in home exercise program to improve strength/mobility for  better functional independence with ADLs. Baseline: Administered on 11/20  Goal status: INITIAL   LONG TERM GOALS: Target date: 08/30/2023    1.  Patient (> 27 years old) will improve five times sit to stand test by at least 3 seconds indicating an increased LE strength and improved balance. Baseline: 11/20: 14.19 Goal status: INITIAL  2.  Patient will increase FOTO score to equal to or greater than  80   to demonstrate statistically significant improvement in mobility and quality of life.  Baseline: 76 Goal status: INITIAL   3.  Patient will increase Berg Balance score by > 6 points to demonstrate decreased fall risk during functional activities. Baseline: 43 Goal status: INITIAL   4.  Patient will reduce timed up and go by at least 3 seconds to reduce fall risk and demonstrate improved transfer/gait ability. Baseline: 10 sec on 12/11 Goal status: INITIAL  5.  Patient will increase 10 meter walk test by at least 0.13 m/s as to improve gait speed for better community ambulation and to reduce fall risk. Baseline: 12/11:23.33 sec  Goal status: INITIAL    ASSESSMENT:  CLINICAL IMPRESSION:  Patient arrived with good motivation form completion of pt activities. PT treatment focused on static and dynamic balance strategies as well as improved BLE strength in hip stabilizers and ankle musculature. Pt tolerated treatment well, but noted to have increased difficulty with balance and ROM on the LLE compared the the R, requiring increased assist for COM control on unlevel surface. HEP adjusted and advanced. Pt will continue to benefit from skilled physical therapy intervention to address impairments, improve QOL, and attain therapy goals.     OBJECTIVE IMPAIRMENTS: decreased balance and difficulty walking.   ACTIVITY LIMITATIONS: lifting, standing, and stairs  PARTICIPATION LIMITATIONS: yard work  PERSONAL FACTORS: 1 comorbidity: previous dx of breast cancer  are also affecting patient's  functional outcome.   REHAB POTENTIAL: Good  CLINICAL DECISION MAKING: Stable/uncomplicated  EVALUATION COMPLEXITY: Low  PLAN:  PT FREQUENCY: 2x/week  PT DURATION: 12 weeks  PLANNED INTERVENTIONS: 97164- PT Re-evaluation, 97110-Therapeutic exercises, 97530- Therapeutic activity, 97112- Neuromuscular re-education, 97535- Self Care, 16109- Manual therapy, [phone]- Gait training, Balance training, Stair training, Cryotherapy, and Moist heat  PLAN FOR NEXT SESSION:   LE strengthening, balance training, gait training, stair training   Golden Pop PT ,DPT Physical Therapist- [place]  [facility]

## 2023-07-08 DIAGNOSIS — M797 Fibromyalgia: Secondary | ICD-10-CM | POA: Diagnosis not present

## 2023-07-08 DIAGNOSIS — M6530 Trigger finger, unspecified finger: Secondary | ICD-10-CM | POA: Diagnosis not present

## 2023-07-08 DIAGNOSIS — M150 Primary generalized (osteo)arthritis: Secondary | ICD-10-CM | POA: Diagnosis not present

## 2023-07-09 ENCOUNTER — Ambulatory Visit: Payer: Medicare HMO | Admitting: Physical Therapy

## 2023-07-09 DIAGNOSIS — M25541 Pain in joints of right hand: Secondary | ICD-10-CM

## 2023-07-09 DIAGNOSIS — R278 Other lack of coordination: Secondary | ICD-10-CM

## 2023-07-09 DIAGNOSIS — R2689 Other abnormalities of gait and mobility: Secondary | ICD-10-CM

## 2023-07-09 DIAGNOSIS — M79671 Pain in right foot: Secondary | ICD-10-CM

## 2023-07-09 DIAGNOSIS — M25542 Pain in joints of left hand: Secondary | ICD-10-CM | POA: Diagnosis not present

## 2023-07-09 DIAGNOSIS — M6281 Muscle weakness (generalized): Secondary | ICD-10-CM | POA: Diagnosis not present

## 2023-07-09 DIAGNOSIS — R262 Difficulty in walking, not elsewhere classified: Secondary | ICD-10-CM | POA: Diagnosis not present

## 2023-07-09 DIAGNOSIS — M79672 Pain in left foot: Secondary | ICD-10-CM

## 2023-07-09 NOTE — Therapy (Signed)
OUTPATIENT PHYSICAL THERAPY NEURO EVALUATION   Patient Name: Krystal [name] MRN: [id number] DOB:[birth year], 72 y.o., female Today's Date: 07/09/2023   PCP: Jerl Mina, MD REFERRING PROVIDER: Loa Socks, NP  END OF SESSION:  PT End of Session - 07/09/23 0800     Visit Number 6    Number of Visits 24    Progress Note Due on Visit 10    PT Start Time 0803    PT Stop Time 0845    PT Time Calculation (min) 42 min    Equipment Utilized During Treatment Gait belt    Activity Tolerance Patient tolerated treatment well               Past Medical History:  Diagnosis Date   Anemia    Arthritis    osteo   Blindness of right eye at birth   Cancer [facility]) 2011   Left breast 2011 and cervical age 3   Carpal tunnel syndrome    Cataract    Depression    Edema 07/27/2011   Fibromyalgia    muscle weakness and pain   GERD (gastroesophageal reflux disease)    Hyperlipidemia    Hypertension    benign   Lymphedema    Osteoporosis    Plantar fasciitis    Pre-diabetes    Raynaud's disease    Restless leg syndrome    Rosacea    Rotator cuff rupture    Sleep apnea    Synovitis    Ulcer    Past Surgical History:  Procedure Laterality Date   ABDOMINAL HYSTERECTOMY  1986   For bleeding and pain   ATRIAL FIBRILLATION ABLATION N/A 05/14/2023   Procedure: ATRIAL FIBRILLATION ABLATION;  Surgeon: Lanier Prude, MD;  [facility]: [facility];  Service: Cardiovascular;  Laterality: N/A;   BLADDER SURGERY  2006   Bladder tack   BREAST RECONSTRUCTION Bilateral 09/17/2012   Procedure: BREAST RECONSTRUCTION;  Surgeon: Wayland Denis, DO;  Location: [facility];  Service: Plastics;  Laterality: Bilateral;  BILATERAL BREAST RECONSTRUCTION WITH TISSUE EXPANDERS AND ALLOMED   BREAST RECONSTRUCTION Right 06/03/2013   Procedure: RIGHT BREAST CAPSULE CONSTRACTURE;  Surgeon: Wayland Denis, DO;  Location: [facility];  Service: Plastics;   Laterality: Right;   CARDIOVERSION N/A 08/06/2022   Procedure: CARDIOVERSION;  Surgeon: Iran Ouch, MD;  Location: [facility];  Service: Cardiovascular;  Laterality: N/A;   CARDIOVERSION N/A 10/02/2022   Procedure: CARDIOVERSION;  Surgeon: Yvonne Kendall, MD;  Location: [facility];  Service: Cardiovascular;  Laterality: N/A;   CARPAL TUNNEL RELEASE Bilateral 2008   EYE SURGERY Right    INJECTION KNEE Left 08/12/2018   Procedure: KNEE INJECTION-LEFT;  Surgeon: Christena Flake, MD;  Location: [facility];  Service: Orthopedics;  Laterality: Left;   JOINT REPLACEMENT     LIPOSUCTION Bilateral 01/29/2013   Procedure: LIPOSUCTION;  Surgeon: Wayland Denis, DO;  Location: [facility];  Service: Plastics;  Laterality: Bilateral;   LIPOSUCTION Right 06/03/2013   Procedure: LIPOSUCTION;  Surgeon: Wayland Denis, DO;  Location: [facility];  Service: Plastics;  Laterality: Right;   MASTECTOMY  2012   rt prophalactic mast-snbx   MASTECTOMY MODIFIED RADICAL  2012   left-axillary nodes   NEUROMA SURGERY Bilateral    NOSE SURGERY  2007   PORT-A-CATH REMOVAL     insertion and   THROAT SURGERY     TONSILLECTOMY     TOTAL KNEE ARTHROPLASTY Right 08/12/2018   Procedure:  TOTAL KNEE ARTHROPLASTY-RIGHT;  Surgeon: Christena Flake, MD;  Location: [facility];  Service: Orthopedics;  Laterality: Right;   TOTAL KNEE ARTHROPLASTY Left 09/22/2019   Procedure: TOTAL KNEE ARTHROPLASTY;  Surgeon: Christena Flake, MD;  Location: [facility];  Service: Orthopedics;  Laterality: Left;   UVULOPALATOPHARYNGOPLASTY     Patient Active Problem List   Diagnosis Date Noted   Raynaud's disease 11/05/2022   Osteoporosis, post-menopausal 11/05/2022   Atrial flutter (HCC) 10/02/2022   First degree AV block 09/11/2022   Arthritis 08/08/2022   Persistent atrial fibrillation (HCC) 08/06/2022   Depression 02/27/2022   Gastroesophageal reflux disease 02/27/2022   Hypertension 02/27/2022   Abnormal LFTs 02/27/2022    Alcohol abuse 02/27/2022   Atrial fibrillation with RVR (HCC) 02/27/2022   Morbid obesity (HCC) 02/27/2022   Pes anserinus bursitis of left knee 02/08/2020   Status post total knee replacement using cement, left 09/22/2019   Status post total knee replacement using cement, right 08/12/2018   Rotator cuff rupture    Status post bilateral breast reconstruction 02/06/2013   Acquired absence of bilateral breasts and nipples 09/17/2012   Breast cancer of lower-outer quadrant of left female breast (HCC) 01/16/2011    ONSET DATE: 07/23/02  REFERRING DIAG:  W10.2,V25.1X5A (ICD-10-CM) - Chemotherapy-induced peripheral neuropathy (HCC)   THERAPY DIAG:  Difficulty in walking, not elsewhere classified  Other abnormalities of gait and mobility  Muscle weakness (generalized)  Other lack of coordination  Pain in left foot  Pain in right foot  Joint pain in both hands  Rationale for Evaluation and Treatment: Rehabilitation  SUBJECTIVE:                                                                                                                                                                                             SUBJECTIVE STATEMENT: Pt reported no changes or falls since last visit. Pt reports that she is doing well. No pain at start of session, but states that she will probably have pain by the end of the day with all the shopping she will be doing all day today.    Pt accompanied by: self  PERTINENT HISTORY: Hypertension, Atrial Fibrillation, First degree AV block, Atrial flutter, Raynaud's disease, GERD, Arthritis, Obesity, Breast cancer, Depression, alcohol abuse  PAIN:  Are you having pain? No  PRECAUTIONS: None  RED FLAGS: None   WEIGHT BEARING RESTRICTIONS: No  FALLS: Has patient fallen in last 6 months? No  LIVING ENVIRONMENT: Lives with: lives with their spouse Lives in: House/apartment Stairs:  5 steps outside front, 3 steps outside back, 13 steps inside,  handrails on one  side on all stairs Has following equipment at home: Single point cane  PLOF: Independent  PATIENT GOALS: Improve short term memory and be able to work in the yard efficiently  OBJECTIVE:  Note: Objective measures were completed at Evaluation unless otherwise noted.  DIAGNOSTIC FINDINGS: CT Cardiac Morph/Pulm Vein 06/02/23: 1. No acute extra cardiac abnormality. 2. Mild stable prominence of the main pulmonary arteries which may indicate degree of pulmonary arterial hypertension. 3. Aortic atherosclerosis.   COGNITION: Overall cognitive status: Within functional limits for tasks assessed   SENSATION: Not tested  COORDINATION: Not tested  EDEMA:  Not measured   MUSCLE TONE: Not tested  MUSCLE LENGTH: Not measured  DTRs:  Not evaluated  POSTURE: rounded shoulders  LOWER EXTREMITY ROM:     Active  Right Eval Left Eval  Hip flexion    Hip extension    Hip abduction    Hip adduction    Hip internal rotation    Hip external rotation    Knee flexion    Knee extension    Ankle dorsiflexion    Ankle plantarflexion    Ankle inversion    Ankle eversion     (Blank rows = not tested)  LOWER EXTREMITY MMT:    MMT Right Eval Left Eval  Hip flexion 4 4  Hip extension    Hip abduction    Hip adduction    Hip internal rotation    Hip external rotation    Knee flexion    Knee extension 5 5  Ankle dorsiflexion 4 4  Ankle plantarflexion    Ankle inversion    Ankle eversion    (Blank rows = not tested)  TRANSFERS: Assistive device utilized: None  Sit to stand: CGA Stand to sit: CGA Chair to chair: CGA  RAMP:  Ramp Comments: Not tested this visit  CURB:  Curb Comments: Not tested this vist  STAIRS:  Comments: Not observed this visit  GAIT: Gait pattern: WFL Assistive device utilized: None Level of assistance: CGA  FUNCTIONAL TESTS:  Berg Balance Scale: 43     PATIENT SURVEYS:  FOTO 76  TODAY'S TREATMENT:        Nustep  BLE/BUE level 1-3 x 5 min cues for proper ROM to reduce pain in bil hands and shoulders.   Seated therex:  From wedge: heel raise x 20  From wedge: toe raise x 20  HS curl x 15 RTB  LAQ x 15 RTB  Hip abduction RTB x 15  Hip flexion RTB x 20   From rocker board  AP rock 2x 20  Static hold 2 x 30 sec    Lateral step over cones x 8 bil  Lateral step up/over 4inch step   Forward Step up/down x 6 bil  Modified SLS with 1 foot on 4 inch step 2 x 25 sec  Min cues for step width and use of visual feedback to improve symmetry of foot contact location due to depth perception issues with use of mirror.   EDUCATION Education details: POC Person educated: Patient Education method: Explanation Education comprehension: verbalized understanding   HOME EXERCISE PROGRAM: Access Code: [facility] URL: [email or web address] Date: 07/05/2023 Prepared by: Grier Rocher  Exercises - Standing Single Leg Stance with Counter Support  - 1 x daily - 7 x weekly - 3 sets - 30 second hold - Mini Squat with Counter Support  - 1 x daily - 4 x weekly - 2 sets - 10 reps - Standing Hip Abduction with Counter  Support  - 1 x daily - 4 x weekly - 2 sets - 10 reps - 2 hold - Standing Hip Extension with Counter Support  - 1 x daily - 4 x weekly - 2 sets - 10 reps - 2 hold - Seated Hip Abduction with Resistance  - 1 x daily - 4 x weekly - 3 sets - 10 reps - Seated Knee Lifts with Resistance  - 1 x daily - 4 x weekly - 3 sets - 10 reps - Seated Ankle Plantarflexion with Resistance  - 1 x daily - 4 x weekly - 3 sets - 10 reps - Seated Ankle Eversion with Resistance  - 1 x daily - 4 x weekly - 3 sets - 10 reps  GOALS: Goals reviewed with patient? Yes  SHORT TERM GOALS: Target date: 07/12/2023     Patient will be independent in home exercise program to improve strength/mobility for better functional independence with ADLs. Baseline: Administered on 11/20  Goal status: INITIAL   LONG TERM GOALS:  Target date: 08/30/2023    1.  Patient (> 31 years old) will improve five times sit to stand test by at least 3 seconds indicating an increased LE strength and improved balance. Baseline: 11/20: 14.19 Goal status: INITIAL  2.  Patient will increase FOTO score to equal to or greater than  80   to demonstrate statistically significant improvement in mobility and quality of life.  Baseline: 76 Goal status: INITIAL   3.  Patient will increase Berg Balance score by > 6 points to demonstrate decreased fall risk during functional activities. Baseline: 43 Goal status: INITIAL   4.  Patient will reduce timed up and go by at least 3 seconds to reduce fall risk and demonstrate improved transfer/gait ability. Baseline: 10 sec on 12/11 Goal status: INITIAL  5.  Patient will increase 10 meter walk test by at least 0.13 m/s as to improve gait speed for better community ambulation and to reduce fall risk. Baseline: 12/11:23.33 sec  Goal status: INITIAL    ASSESSMENT:  CLINICAL IMPRESSION:  Patient arrived with good motivation form completion of pt activities. PT treatment focused on BLE strengthening as well as static and dynamic balance strategies as well as improved BLE strength in hip stabilizers and ankle musculature. Mild soreness in bil knees with stair training, but no pain per pt report.  Pt will continue to benefit from skilled physical therapy intervention to address impairments, improve QOL, and attain therapy goals.     OBJECTIVE IMPAIRMENTS: decreased balance and difficulty walking.   ACTIVITY LIMITATIONS: lifting, standing, and stairs  PARTICIPATION LIMITATIONS: yard work  PERSONAL FACTORS: 1 comorbidity: previous dx of breast cancer  are also affecting patient's functional outcome.   REHAB POTENTIAL: Good  CLINICAL DECISION MAKING: Stable/uncomplicated  EVALUATION COMPLEXITY: Low  PLAN:  PT FREQUENCY: 2x/week  PT DURATION: 12 weeks  PLANNED INTERVENTIONS: 97164- PT  Re-evaluation, 97110-Therapeutic exercises, 97530- Therapeutic activity, 97112- Neuromuscular re-education, 97535- Self Care, 73532- Manual therapy, [phone]- Gait training, Balance training, Stair training, Cryotherapy, and Moist heat  PLAN FOR NEXT SESSION:   LE strengthening, balance training, gait training, stair training   Golden Pop PT ,DPT Physical Therapist- [place]  [facility]

## 2023-07-11 ENCOUNTER — Ambulatory Visit: Payer: Medicare HMO | Admitting: Occupational Therapy

## 2023-07-11 ENCOUNTER — Ambulatory Visit: Payer: Medicare HMO | Admitting: Physician Assistant

## 2023-07-11 ENCOUNTER — Ambulatory Visit: Payer: Medicare HMO | Admitting: Physical Therapy

## 2023-07-11 DIAGNOSIS — M25542 Pain in joints of left hand: Secondary | ICD-10-CM | POA: Diagnosis not present

## 2023-07-11 DIAGNOSIS — M6281 Muscle weakness (generalized): Secondary | ICD-10-CM | POA: Diagnosis not present

## 2023-07-11 DIAGNOSIS — M25541 Pain in joints of right hand: Secondary | ICD-10-CM | POA: Diagnosis not present

## 2023-07-11 DIAGNOSIS — R262 Difficulty in walking, not elsewhere classified: Secondary | ICD-10-CM | POA: Diagnosis not present

## 2023-07-11 DIAGNOSIS — R278 Other lack of coordination: Secondary | ICD-10-CM | POA: Diagnosis not present

## 2023-07-11 DIAGNOSIS — M79672 Pain in left foot: Secondary | ICD-10-CM | POA: Diagnosis not present

## 2023-07-11 DIAGNOSIS — R2689 Other abnormalities of gait and mobility: Secondary | ICD-10-CM | POA: Diagnosis not present

## 2023-07-11 DIAGNOSIS — M79671 Pain in right foot: Secondary | ICD-10-CM | POA: Diagnosis not present

## 2023-07-12 ENCOUNTER — Encounter: Payer: Self-pay | Admitting: Occupational Therapy

## 2023-07-12 NOTE — Therapy (Unsigned)
[facility] [address] [place], [place], [address] Phone: [phone]   Fax:  [phone]  Occupational Therapy Treatment  Patient Details  Name: Krystal [name] MRN: [id number] Date of Birth: 1951/05/10 Referring Provider (OT): DR Allena Katz   Encounter Date: 07/11/2023   OT End of Session - 07/14/23 1724     Activity Tolerance Patient tolerated treatment well    Behavior During Therapy [facility] for tasks assessed/performed             Past Medical History:  Diagnosis Date   Anemia    Arthritis    osteo   Blindness of right eye at birth   Cancer [facility]) 2011   Left breast 2011 and cervical age 52   Carpal tunnel syndrome    Cataract    Depression    Edema 07/27/2011   Fibromyalgia    muscle weakness and pain   GERD (gastroesophageal reflux disease)    Hyperlipidemia    Hypertension    benign   Lymphedema    Osteoporosis    Plantar fasciitis    Pre-diabetes    Raynaud's disease    Restless leg syndrome    Rosacea    Rotator cuff rupture    Sleep apnea    Synovitis    Ulcer     Past Surgical History:  Procedure Laterality Date   ABDOMINAL HYSTERECTOMY  1986   For bleeding and pain   ATRIAL FIBRILLATION ABLATION N/A 05/14/2023   Procedure: ATRIAL FIBRILLATION ABLATION;  Surgeon: Lanier Prude, MD;  [facility];  Service: Cardiovascular;  Laterality: N/A;   BLADDER SURGERY  2006   Bladder tack   BREAST RECONSTRUCTION Bilateral 09/17/2012   Procedure: BREAST RECONSTRUCTION;  Surgeon: Wayland Denis, DO;  Location: [facility];  Service: Plastics;  Laterality: Bilateral;  BILATERAL BREAST RECONSTRUCTION WITH TISSUE EXPANDERS AND ALLOMED   BREAST RECONSTRUCTION Right 06/03/2013   Procedure: RIGHT BREAST CAPSULE CONSTRACTURE;  Surgeon: Wayland Denis, DO;  Location:  SURGERY CENTER;  Service: Plastics;  Laterality: Right;   CARDIOVERSION N/A 08/06/2022   Procedure:  CARDIOVERSION;  Surgeon: Iran Ouch, MD;  Location: [facility];  Service: Cardiovascular;  Laterality: N/A;   CARDIOVERSION N/A 10/02/2022   Procedure: CARDIOVERSION;  Surgeon: Yvonne Kendall, MD;  Location: [facility];  Service: Cardiovascular;  Laterality: N/A;   CARPAL TUNNEL RELEASE Bilateral 2008   EYE SURGERY Right    INJECTION KNEE Left 08/12/2018   Procedure: KNEE INJECTION-LEFT;  Surgeon: Christena Flake, MD;  Location: [facility];  Service: Orthopedics;  Laterality: Left;   JOINT REPLACEMENT     LIPOSUCTION Bilateral 01/29/2013   Procedure: LIPOSUCTION;  Surgeon: Wayland Denis, DO;  Location:  SURGERY CENTER;  Service: Plastics;  Laterality: Bilateral;   LIPOSUCTION Right 06/03/2013   Procedure: LIPOSUCTION;  Surgeon: Wayland Denis, DO;  Location:  SURGERY CENTER;  Service: Plastics;  Laterality: Right;   MASTECTOMY  2012   rt prophalactic mast-snbx   MASTECTOMY MODIFIED RADICAL  2012   left-axillary nodes   NEUROMA SURGERY Bilateral    NOSE SURGERY  2007   PORT-A-CATH REMOVAL     insertion and   THROAT SURGERY     TONSILLECTOMY     TOTAL KNEE ARTHROPLASTY Right 08/12/2018   Procedure: TOTAL KNEE ARTHROPLASTY-RIGHT;  Surgeon: Christena Flake, MD;  Location: [facility];  Service: Orthopedics;  Laterality: Right;   TOTAL KNEE ARTHROPLASTY Left 09/22/2019   Procedure: TOTAL  KNEE ARTHROPLASTY;  Surgeon: Christena Flake, MD;  Location: [facility];  Service: Orthopedics;  Laterality: Left;   UVULOPALATOPHARYNGOPLASTY      There were no vitals filed for this visit.   Subjective Assessment - 07/14/23 1725     Subjective  Has been working helping other people, she has been busy lately with her dad's estate.  She would like to get back to aquatic therapy, deep water class but the center is closed until probably March.  She also does senior center chair activity 2 days a week.    Pertinent History 02/11/23 Dr Allena Katz note: Krystal [name] is a 72 y.o. female is here today for  follow up of osteoarthritis. The patient's allergies, current medications, past family history, past medical history, past social history, past surgical history and problem list were reviewed and updated as appropriate.  She continues to have pain of the hand joints. She is taking Lyrica, Cymbalta and Plaquenil. She is not able to do things with her hands anymore. She has noticed some swelling of the hand joints. She has been offered [facility] surgery but she declined.Assessment and Plan  Osteoarthritis of multiple joints: active -- Unable to take NSAIDs due to being on blood thinners -- She states that Tramadol did not help. -- She states that Tylenol made her liver enzymes increase. -- Failed Plaquenil -- Continue Cymbalta. -- Trial of Methotrexate 10 mg weekly and Folic Acid 1 mg Daily -- Refer to OT  2. Fibromyalgia -- She is not taking gabapentin. -- Continue Lyrica 300 mg/day -- She continues to stay active.  3. Long term use of high risk medication -- Methotrexate is    Patient Stated Goals I don't want surgery - and want my pain in thumbs and fingers better so I can do things around the house, yard work, crafts, crochet, ,knit- and exercise classes    Currently in Pain? Yes    Pain Score 6     Pain Location Hand    Pain Orientation Right;Left    Pain Descriptors / Indicators Aching;Burning;Tightness    Pain Type Chronic pain    Pain Onset More than a month ago    Pain Frequency Constant            Pt reports she is going to the beach over Christmas.    Fluidotherapy:   Pt seen for fluidotherapy to bilateral hands for 10 mins to decrease pain, increase tissue mobility and decrease pain.  Pt performing active range of motion while in fluido of bilateral hands and wrists.  Pt continues with increased inflammation in bilateral hands.  Increased pain over metacarpals as well as second and third DIPs. Increased tenderness over left third and fourth A1 pulleys and right fourth Pain bilaterally  6/10 Decreased grip and prehension strength with increased pain  Therapeutic Exercise: Pt performing gentle AROM of bilateral wrist and hands, tendon gliding exercises for 10 reps with cues for proper form and technique.   Iontophoresis Iontophoresis with dexamethasone for 19 mins bilateral hands, right at A1 pulley of 4th, left 3rd and 4th A1 pulley, current 2.0.  Right small patch, left medium patch.   Skin check done prior to ionto, pt wearing patch for another hour after session and then will remove.    Pt continues to tolerate ionto well.  Pool currently closed for aquatics but plans to get back to it as soon as it opens in the spring.   OA education  Patient reports doing a  couple of Tai Chi classes in the cancer center and felt better afterwards  Pt to do contrast at home twice a day followed by tendon glides gentle AROM 10 reps  Continue with isotoner gloves     OT Education - 07/14/23 1727     Education Details Ionto use - progress and changes to HEP    Person(s) Educated Patient    Methods Explanation;Demonstration;Tactile cues;Verbal cues;Handout    Comprehension Verbal cues required;Returned demonstration;Verbalized understanding                  OT Long Term Goals - 06/11/23 1830       OT LONG TERM GOAL #1   Title Pt to be independent in HEP to decrease pain to less than 5/10 while maintain AROM in bilateral hands    Baseline AROM in digits WNL - thumb decrease opposition and PA and RA - pain 10/10 R hand , L 8/10 - tenderness over several A1pulley's Sept decreease -but now flare up again after Dad past away and was not seen for about 2 months    Time 4    Period Weeks    Status On-going    Target Date 07/09/23      OT LONG TERM GOAL #2   Title Pt to verbalize 3 joint protection and AE to decrease pain and increase ease of use of hands with less pain    Baseline Pt did very well with AE and modifications but since not seen 2 months and Dad past away -  having flareup again    Time 4    Period Weeks    Status On-going    Target Date 07/09/23      OT LONG TERM GOAL #3   Title Pt show AROM WFL in bilateral hands with digits -  pain less than 4/10 during ADL's    Baseline R hand pain 10/10 , L 8/10 and  AROM WFL except thumb opposition, PA and RA NOW pain again  increase to 8-10/10 in bilateral hands including thumbs    Time 6    Period Weeks    Status On-going    Target Date 07/23/23      OT LONG TERM GOAL #4   Title Pt to impliment lifestyle changes to increase time for self to do exercise and family to be more independent    Baseline Use to do aquitics exercises in water 8 months ago- help dad in everything now bathing, eating, dressing - more than 75 % - help husband - dad has aid 20 hrs week - carry and unpack self groceries - and prep and cook SEPT  dad in ass living , and started  water exercise clasess , and modifying activities- and taking care of herself  NOW dad past away was out of town prior not been to [employer], over doing yard work , and HEP - increase pain again 10/10    Time 6    Period Weeks    Status On-going    Target Date 07/23/23                   Plan - 07/14/23 1728     Clinical Impression Statement Pt refer In July by Dr Allena Katz with pain in bilateral hands - OA and changes to bilateral hands , all digits and joints -pt was seen for 7 visits -and pain at rest decrease to 2/10 in bilateral hands - she was doing really well with modifications of  tasks and AE use - Also doing modalities to decrease stiffness and pain - Pt was out of town at [employer] and then her Dad passed away and was not seen from Sept to middle Nov-and then did not see pt for about 3 wks - had funeral and pt return to therapy again with increase pain , edema, stiffness and decrease strength in bilateral hands compare to in the past pain 6/10 and increase tenderness again in R 4th and L  3rd and 4th digits on bilatral hands triggering and  increase tenderness- Her grip and prehension strenght decrease greatly compare to Sept - and pt appear to have flare up in her OA - with joint changes at DIP's and PIP's - increase edema and pain,  Pt has done some changes in her activities, priorities, saying No- but done all the flowers for the funeral HEP gentle, pain free AROM - modifications and pacing herself - Performed Ionto with dexamethazone with reported decrease in pain.  She will be out of town during the holiday and will return to therapy upon her return.  She will continue with contrast, HEP and activity modification while away.   Pt can benefit from skilled OT services to decrease pain , edema-increase strength while reviewing again modifcations and AE to decrease pain and increase ease of use of bilateral hands in ADL's and IADL's.    OT Occupational Profile and History Problem Focused Assessment - Including review of records relating to presenting problem    Occupational performance deficits (Please refer to evaluation for details): ADL's;IADL's;Leisure;Social Participation;Play;Rest and Sleep    Body Structure / Function / Physical Skills ADL;Coordination;IADL;Pain;Strength;Edema;FMC;Decreased knowledge of use of DME;Flexibility;ROM;UE functional use    Rehab Potential Fair    Clinical Decision Making Several treatment options, min-mod task modification necessary    Modification or Assistance to Complete Evaluation  No modification of tasks or assist necessary to complete eval    OT Frequency 1x / week    OT Duration 6 weeks    OT Treatment/Interventions Self-care/ADL training;Therapeutic exercise;Patient/family education;Splinting;Fluidtherapy;Contrast Bath;Ultrasound;DME and/or AE instruction;Manual Therapy;Passive range of motion;Paraffin;Iontophoresis    Consulted and Agree with Plan of Care Patient              Patient will benefit from skilled therapeutic intervention in order to improve the following deficits and  impairments:   Body Structure / Function / Physical Skills: ADL, Coordination, IADL, Pain, Strength, Edema, FMC, Decreased knowledge of use of DME, Flexibility, ROM, UE functional use       Visit Diagnosis: Muscle weakness (generalized)  Other lack of coordination  Joint pain in both hands    Problem List Patient Active Problem List   Diagnosis Date Noted   Raynaud's disease 11/05/2022   Osteoporosis, post-menopausal 11/05/2022   Atrial flutter (HCC) 10/02/2022   First degree AV block 09/11/2022   Arthritis 08/08/2022   Persistent atrial fibrillation (HCC) 08/06/2022   Depression 02/27/2022   Gastroesophageal reflux disease 02/27/2022   Hypertension 02/27/2022   Abnormal LFTs 02/27/2022   Alcohol abuse 02/27/2022   Atrial fibrillation with RVR (HCC) 02/27/2022   Morbid obesity (HCC) 02/27/2022   Pes anserinus bursitis of left knee 02/08/2020   Status post total knee replacement using cement, left 09/22/2019   Status post total knee replacement using cement, right 08/12/2018   Rotator cuff rupture    Status post bilateral breast reconstruction 02/06/2013   Acquired absence of bilateral breasts and nipples 09/17/2012   Breast cancer of lower-outer quadrant of  left female breast (HCC) 01/16/2011    Krystal [name], Krystal [name],Krystal [name] 07/14/2023, 5:58 PM  [facility] [address], [place], [address] Phone: [phone]   Fax:  [phone]  Name: Krystal [name] MRN: [id number] Date of Birth: October 20, 1950

## 2023-07-15 ENCOUNTER — Ambulatory Visit: Payer: Medicare HMO | Admitting: Physical Therapy

## 2023-07-19 ENCOUNTER — Ambulatory Visit: Payer: Medicare HMO | Admitting: Plastic Surgery

## 2023-07-19 ENCOUNTER — Encounter: Payer: Self-pay | Admitting: Plastic Surgery

## 2023-07-19 ENCOUNTER — Telehealth: Payer: Self-pay | Admitting: Internal Medicine

## 2023-07-19 ENCOUNTER — Telehealth: Payer: Self-pay

## 2023-07-19 ENCOUNTER — Ambulatory Visit: Payer: Medicare HMO | Admitting: Physical Therapy

## 2023-07-19 VITALS — BP 172/82 | HR 75 | Ht 63.0 in | Wt 175.0 lb

## 2023-07-19 DIAGNOSIS — C50512 Malignant neoplasm of lower-outer quadrant of left female breast: Secondary | ICD-10-CM | POA: Diagnosis not present

## 2023-07-19 DIAGNOSIS — Z170 Estrogen receptor positive status [ER+]: Secondary | ICD-10-CM

## 2023-07-19 DIAGNOSIS — Z9889 Other specified postprocedural states: Secondary | ICD-10-CM

## 2023-07-19 DIAGNOSIS — I4891 Unspecified atrial fibrillation: Secondary | ICD-10-CM

## 2023-07-19 DIAGNOSIS — Z9013 Acquired absence of bilateral breasts and nipples: Secondary | ICD-10-CM

## 2023-07-19 NOTE — Progress Notes (Signed)
Patient ID: Krystal [name], female    DOB: 11-08-1950, 72 y.o.   MRN: [id number]   Chief Complaint  Patient presents with   Breast Problem    The patient is a 72 year old female here for follow-up on her breast reconstruction. She was diagnosed with stage II invasive ductal carcinoma of the left breast.  She underwent bilateral mastectomies and 2012.  It was estrogen and progesterone positive and HER2 negative.  She had adjuvant chemotherapy.  She was 5 feet 3 inches tall and 175 pounds when I saw her in 2014.  Her original bra size was a 38DD.  In February 2014 she underwent bilateral expander placement with ADM.  In July 2014 she had removal of bilateral expanders and placement of implants.  According to the operative report the patient had Mentor 480 cc implants placed bilaterally.In November 2014 she had a right capsulotomy with repositioning of the implant and liposuction.  The patient has decided that instead of getting new implants she just wants to have the current implants removed and have a flap closure.  Patient recently had cardiac surgery with stent.  She will need cardiac clearance prior to any surgery.     Review of Systems  Constitutional: Negative.   HENT: Negative.    Eyes: Negative.   Respiratory: Negative.    Cardiovascular: Negative.   Gastrointestinal: Negative.   Endocrine: Negative.   Genitourinary: Negative.   Musculoskeletal: Negative.     Past Medical History:  Diagnosis Date   Anemia    Arthritis    osteo   Blindness of right eye at birth   Cancer [facility]) 2011   Left breast 2011 and cervical age 71   Carpal tunnel syndrome    Cataract    Depression    Edema 07/27/2011   Fibromyalgia    muscle weakness and pain   GERD (gastroesophageal reflux disease)    Hyperlipidemia    Hypertension    benign   Lymphedema    Osteoporosis    Plantar fasciitis    Pre-diabetes    Raynaud's disease    Restless leg syndrome    Rosacea    Rotator cuff rupture     Sleep apnea    Synovitis    Ulcer     Past Surgical History:  Procedure Laterality Date   ABDOMINAL HYSTERECTOMY  1986   For bleeding and pain   ATRIAL FIBRILLATION ABLATION N/A 05/14/2023   Procedure: ATRIAL FIBRILLATION ABLATION;  Surgeon: Lanier Prude, MD;  [facility];  Service: Cardiovascular;  Laterality: N/A;   BLADDER SURGERY  2006   Bladder tack   BREAST RECONSTRUCTION Bilateral 09/17/2012   Procedure: BREAST RECONSTRUCTION;  Surgeon: Wayland Denis, DO;  Location: [facility];  Service: Plastics;  Laterality: Bilateral;  BILATERAL BREAST RECONSTRUCTION WITH TISSUE EXPANDERS AND ALLOMED   BREAST RECONSTRUCTION Right 06/03/2013   Procedure: RIGHT BREAST CAPSULE CONSTRACTURE;  Surgeon: Wayland Denis, DO;  Location: [facility];  Service: Plastics;  Laterality: Right;   CARDIOVERSION N/A 08/06/2022   Procedure: CARDIOVERSION;  Surgeon: Iran Ouch, MD;  Location: [facility];  Service: Cardiovascular;  Laterality: N/A;   CARDIOVERSION N/A 10/02/2022   Procedure: CARDIOVERSION;  Surgeon: Yvonne Kendall, MD;  Location: [facility];  Service: Cardiovascular;  Laterality: N/A;   CARPAL TUNNEL RELEASE Bilateral 2008   EYE SURGERY Right    INJECTION KNEE Left 08/12/2018   Procedure: KNEE INJECTION-LEFT;  Surgeon: Christena Flake, MD;  Location: [facility];  Service: Orthopedics;  Laterality: Left;   JOINT REPLACEMENT     LIPOSUCTION Bilateral 01/29/2013   Procedure: LIPOSUCTION;  Surgeon: Wayland Denis, DO;  Location: [facility];  Service: Plastics;  Laterality: Bilateral;   LIPOSUCTION Right 06/03/2013   Procedure: LIPOSUCTION;  Surgeon: Wayland Denis, DO;  Location: [facility];  Service: Plastics;  Laterality: Right;   MASTECTOMY  2012   rt prophalactic mast-snbx   MASTECTOMY MODIFIED RADICAL  2012   left-axillary nodes   NEUROMA SURGERY Bilateral    NOSE SURGERY  2007   PORT-A-CATH REMOVAL     insertion  and   THROAT SURGERY     TONSILLECTOMY     TOTAL KNEE ARTHROPLASTY Right 08/12/2018   Procedure: TOTAL KNEE ARTHROPLASTY-RIGHT;  Surgeon: Christena Flake, MD;  Location: [facility];  Service: Orthopedics;  Laterality: Right;   TOTAL KNEE ARTHROPLASTY Left 09/22/2019   Procedure: TOTAL KNEE ARTHROPLASTY;  Surgeon: Christena Flake, MD;  Location: [facility];  Service: Orthopedics;  Laterality: Left;   UVULOPALATOPHARYNGOPLASTY        Current Outpatient Medications:    acetaminophen-codeine (TYLENOL #3) 300-30 MG tablet, Take 1 tablet by mouth 3 (three) times daily as needed., Disp: , Rfl:    alendronate (FOSAMAX) 70 MG tablet, Take 70 mg by mouth once a week. Take with a full glass of water on an empty stomach., Disp: , Rfl:    amiodarone (PACERONE) 200 MG tablet, TAKE 1 TABLET ONE TIME DAILY . STOP PROPAFENONE, Disp: 90 tablet, Rfl: 3   apixaban (ELIQUIS) 5 MG TABS tablet, Take 1 tablet (5 mg total) by mouth 2 (two) times daily., Disp: 60 tablet, Rfl: 5   cetirizine (ZYRTEC) 10 MG tablet, Take 10 mg by mouth daily., Disp: , Rfl:    cholecalciferol (VITAMIN D3) 25 MCG (1000 UNIT) tablet, Take 1,000 Units by mouth daily., Disp: , Rfl:    diltiazem (CARDIZEM CD) 120 MG 24 hr capsule, Take 1 capsule (120 mg total) by mouth in the morning and at bedtime., Disp: 180 capsule, Rfl: 0   DULoxetine (CYMBALTA) 60 MG capsule, Take 60 mg by mouth daily., Disp: , Rfl:    fluticasone (FLONASE) 50 MCG/ACT nasal spray, Place 2 sprays into both nostrils daily., Disp: , Rfl:    folic acid (FOLVITE) 1 MG tablet, Take 1 mg by mouth daily., Disp: , Rfl:    furosemide (LASIX) 20 MG tablet, Take 20 mg by mouth 2 (two) times daily., Disp: , Rfl:    levocetirizine (XYZAL) 5 MG tablet, Take 5 mg by mouth every evening., Disp: , Rfl:    LYSINE ACETATE PO, Take 1,000 mg by mouth daily as needed (fever blister)., Disp: , Rfl:    magnesium oxide (MAG-OX) 400 (240 Mg) MG tablet, Take 400 mg by mouth daily as needed (low magnesium).,  Disp: , Rfl:    omeprazole (PRILOSEC) 20 MG capsule, Take 20 mg by mouth daily., Disp: , Rfl:    potassium chloride SA (KLOR-CON M) 20 MEQ tablet, Take 1 tablet (20 mEq total) by mouth 2 (two) times daily., Disp: 180 tablet, Rfl: 3   pregabalin (LYRICA) 300 MG capsule, Take 300 mg by mouth 2 (two) times daily., Disp: , Rfl:    rOPINIRole (REQUIP) 1 MG tablet, Take 1 mg by mouth at bedtime., Disp: , Rfl:    triamcinolone (KENALOG) 0.025 % cream, Apply 1 Application topically as needed (flare - up)., Disp: , Rfl:    Vibegron (  GEMTESA) 75 MG TABS, Take 75 mg by mouth daily., Disp: , Rfl:    Vitamin D, Ergocalciferol, (DRISDOL) 1.25 MG (50000 UNIT) CAPS capsule, Take 50,000 Units by mouth every 7 (seven) days., Disp: , Rfl:    zolpidem (AMBIEN) 10 MG tablet, Take 10 mg by mouth at bedtime., Disp: , Rfl:    pantoprazole (PROTONIX) 40 MG tablet, Take 1 tablet (40 mg total) by mouth daily., Disp: 45 tablet, Rfl: 0   Objective:   Vitals:   07/19/23 1132  BP: (!) 172/82  Pulse: 75  SpO2: 92%    Physical Exam Vitals and nursing note reviewed.  Constitutional:      Appearance: Normal appearance.  HENT:     Head: Normocephalic and atraumatic.  Cardiovascular:     Rate and Rhythm: Normal rate.     Pulses: Normal pulses.  Pulmonary:     Effort: Pulmonary effort is normal.  Abdominal:     Palpations: Abdomen is soft.  Skin:    General: Skin is warm.     Capillary Refill: Capillary refill takes less than 2 seconds.  Neurological:     Mental Status: She is alert and oriented to person, place, and time.  Psychiatric:        Mood and Affect: Mood normal.        Behavior: Behavior normal.        Thought Content: Thought content normal.        Judgment: Judgment normal.     Assessment & Plan:  Atrial fibrillation with RVR (HCC)  Malignant neoplasm of lower-outer quadrant of left breast of female, estrogen receptor positive (HCC)  Status post bilateral breast reconstruction  Acquired  absence of bilateral breasts and nipples  Plan for bilateral implant removal with flap closure.  Pictures were obtained of the patient and placed in the chart with the patient's or guardian's permission.   Alena Bills Amarie Viles, DO

## 2023-07-19 NOTE — Telephone Encounter (Signed)
Patient with diagnosis of afib on Eliquis for anticoagulation.    Procedure:  removal of bilateral implants  Date of procedure: TBD   CHA2DS2-VASc Score = 3   This indicates a 3.2% annual risk of stroke. The patient's score is based upon: CHF History: 0 HTN History: 1 Diabetes History: 0 Stroke History: 0 Vascular Disease History: 0 Age Score: 1 Gender Score: 1      CrCl 78 mL/min Platelet count 256 K   Atrial fibrilation ablation done on 05/14/2023  Requires uninterrupted DOAC for 3 months post ablation.Per office protocol, patient can hold Eliquis for 2 days prior to procedure any time after Jan 22,2025     **This guidance is not considered finalized until pre-operative APP has relayed final recommendations.**

## 2023-07-19 NOTE — Telephone Encounter (Signed)
Faxed surgical clearance to patient's cardiologist, Sherryl Manges, MD. Received fax success confirmation. Updated spreadsheet. Original document in personal clearance folder.

## 2023-07-19 NOTE — Telephone Encounter (Signed)
   Pre-operative Risk Assessment    Patient Name: Krystal [name]  DOB: 11/19/1950 MRN: [id number]   Date of last office visit: 06/11/2023 Date of next office visit: 08/19/2023   Request for Surgical Clearance    Procedure:   removal of bilateral implants  Date of Surgery:  Clearance TBD                                Surgeon:  Dr. Foster Simpson  Surgeon's Group or Practice Name:  [facility]  Phone number:  [phone] Fax number:  [phone]   Type of Clearance Requested:   - Pharmacy:  Hold Apixaban (Eliquis) prior to surgery   Type of Anesthesia:  General    Additional requests/questions:    Queen Slough   07/19/2023, 2:48 PM

## 2023-07-22 NOTE — Telephone Encounter (Signed)
   Patient Name: Krystal [name]  DOB: 12-19-1950 MRN: [id number]  Primary Cardiologist: Lorine Bears, MD  Chart reviewed as part of pre-operative protocol coverage. Pre-op clearance already addressed by colleagues in earlier phone notes. To summarize recommendations:  -Atrial fibrilation ablation done on 05/14/2023  Requires uninterrupted DOAC for 3 months post ablation.Per office protocol, patient can hold Eliquis for 2 days prior to procedure any time after Jan 22,2025    No medical clearance was requested.  Will route this bundled recommendation to requesting provider via Epic fax function and remove from pre-op pool. Please call with questions.  Sharlene Dory, PA-C 07/22/2023, 7:59 AM

## 2023-07-23 ENCOUNTER — Telehealth: Payer: Self-pay

## 2023-07-23 ENCOUNTER — Ambulatory Visit: Payer: Medicare HMO | Admitting: Physical Therapy

## 2023-07-23 NOTE — Telephone Encounter (Signed)
 Krystal Fabry, PA-C from patient's cardiology office (Dr. Deatrice Cage) faxed surgical clearance stating that patient can hold Eliquis  for 2 days prior to procedure any time after 08/14/23. Dr. Lowery has reviewed the clearance and a copy has been sent to batch scanning. Will update surgical clearance spreadsheet.

## 2023-07-26 ENCOUNTER — Ambulatory Visit: Payer: Medicare HMO | Admitting: Physical Therapy

## 2023-07-29 ENCOUNTER — Ambulatory Visit: Payer: Medicare HMO | Admitting: Urology

## 2023-07-30 ENCOUNTER — Institutional Professional Consult (permissible substitution): Payer: Medicare HMO | Admitting: Plastic Surgery

## 2023-07-30 ENCOUNTER — Ambulatory Visit: Payer: Medicare HMO | Admitting: Physical Therapy

## 2023-08-01 ENCOUNTER — Ambulatory Visit: Payer: Medicare HMO | Admitting: Physical Therapy

## 2023-08-05 DIAGNOSIS — M65322 Trigger finger, left index finger: Secondary | ICD-10-CM | POA: Diagnosis not present

## 2023-08-05 DIAGNOSIS — M720 Palmar fascial fibromatosis [Dupuytren]: Secondary | ICD-10-CM | POA: Diagnosis not present

## 2023-08-05 DIAGNOSIS — M65331 Trigger finger, right middle finger: Secondary | ICD-10-CM | POA: Diagnosis not present

## 2023-08-05 DIAGNOSIS — M65341 Trigger finger, right ring finger: Secondary | ICD-10-CM | POA: Diagnosis not present

## 2023-08-06 ENCOUNTER — Ambulatory Visit: Payer: Medicare HMO | Admitting: Physical Therapy

## 2023-08-06 ENCOUNTER — Ambulatory Visit: Payer: Medicare HMO | Attending: Adult Health

## 2023-08-06 ENCOUNTER — Telehealth: Payer: Self-pay | Admitting: Urology

## 2023-08-06 DIAGNOSIS — M6281 Muscle weakness (generalized): Secondary | ICD-10-CM | POA: Diagnosis not present

## 2023-08-06 DIAGNOSIS — M25541 Pain in joints of right hand: Secondary | ICD-10-CM | POA: Insufficient documentation

## 2023-08-06 DIAGNOSIS — R262 Difficulty in walking, not elsewhere classified: Secondary | ICD-10-CM | POA: Diagnosis not present

## 2023-08-06 DIAGNOSIS — M25542 Pain in joints of left hand: Secondary | ICD-10-CM | POA: Insufficient documentation

## 2023-08-06 DIAGNOSIS — R278 Other lack of coordination: Secondary | ICD-10-CM | POA: Diagnosis not present

## 2023-08-06 DIAGNOSIS — R2689 Other abnormalities of gait and mobility: Secondary | ICD-10-CM | POA: Diagnosis not present

## 2023-08-06 NOTE — Telephone Encounter (Signed)
 Pt stated that she picked up her RX for Gemtesa  at [facility] and was charged $430 for a 30 day supply. Pt stated that she is not able to afford this monthly and wants to know if she can be given a 90 days supply instead of 30 moving forward. In the meantime, pt wants to know if she can be given 60 day supply of samples as she just paid for her RX. Please advise patient.

## 2023-08-06 NOTE — Telephone Encounter (Signed)
 Spoke with patient and she got a 30 day supply from [facility] . I advised pt that we sent in 90 day to [facility], advised that she need to speak with them to understand why a 30 day supply cost as much as a 90 day and they need to send her the other 60 day.

## 2023-08-06 NOTE — Therapy (Signed)
 OUTPATIENT PHYSICAL THERAPY NEURO EVALUATION   Patient Name: Krystal [name] MRN: [id number] DOB:06/29/1951, 73 y.o., female Today's Date: 08/06/2023   PCP: Valora Agent, MD REFERRING PROVIDER: Crawford Morna Pickle, NP  END OF SESSION:  PT End of Session - 08/06/23 0936     Visit Number 7    Number of Visits 24    Date for PT Re-Evaluation 08/30/23    Progress Note Due on Visit 10    PT Start Time 0936    PT Stop Time 1014    PT Time Calculation (min) 38 min    Equipment Utilized During Treatment Gait belt    Activity Tolerance Patient tolerated treatment well                Past Medical History:  Diagnosis Date   Anemia    Arthritis    osteo   Blindness of right eye at birth   Cancer [facility]) 2011   Left breast 2011 and cervical age 42   Carpal tunnel syndrome    Cataract    Depression    Edema 07/27/2011   Fibromyalgia    muscle weakness and pain   GERD (gastroesophageal reflux disease)    Hyperlipidemia    Hypertension    benign   Lymphedema    Osteoporosis    Plantar fasciitis    Pre-diabetes    Raynaud's disease    Restless leg syndrome    Rosacea    Rotator cuff rupture    Sleep apnea    Synovitis    Ulcer    Past Surgical History:  Procedure Laterality Date   ABDOMINAL HYSTERECTOMY  1986   For bleeding and pain   ATRIAL FIBRILLATION ABLATION N/A 05/14/2023   Procedure: ATRIAL FIBRILLATION ABLATION;  Surgeon: Cindie Ole DASEN, MD;  Location: [facility];  Service: Cardiovascular;  Laterality: N/A;   BLADDER SURGERY  2006   Bladder tack   BREAST RECONSTRUCTION Bilateral 09/17/2012   Procedure: BREAST RECONSTRUCTION;  Surgeon: Estefana Reichert, DO;  Location: [facility];  Service: Plastics;  Laterality: Bilateral;  BILATERAL BREAST RECONSTRUCTION WITH TISSUE EXPANDERS AND ALLOMED   BREAST RECONSTRUCTION Right 06/03/2013   Procedure: RIGHT BREAST CAPSULE CONSTRACTURE;  Surgeon: Estefana Reichert, DO;  Location: [facility];  Service: Plastics;  Laterality: Right;   CARDIOVERSION N/A 08/06/2022   Procedure: CARDIOVERSION;  Surgeon: Darron Deatrice LABOR, MD;  Location: [facility];  Service: Cardiovascular;  Laterality: N/A;   CARDIOVERSION N/A 10/02/2022   Procedure: CARDIOVERSION;  Surgeon: Mady Bruckner, MD;  Location: [facility];  Service: Cardiovascular;  Laterality: N/A;   CARPAL TUNNEL RELEASE Bilateral 2008   EYE SURGERY Right    INJECTION KNEE Left 08/12/2018   Procedure: KNEE INJECTION-LEFT;  Surgeon: Edie Norleen PARAS, MD;  Location: [facility];  Service: Orthopedics;  Laterality: Left;   JOINT REPLACEMENT     LIPOSUCTION Bilateral 01/29/2013   Procedure: LIPOSUCTION;  Surgeon: Estefana Reichert, DO;  Location: [facility];  Service: Plastics;  Laterality: Bilateral;   LIPOSUCTION Right 06/03/2013   Procedure: LIPOSUCTION;  Surgeon: Estefana Reichert, DO;  Location: [facility];  Service: Plastics;  Laterality: Right;   MASTECTOMY  2012   rt prophalactic mast-snbx   MASTECTOMY MODIFIED RADICAL  2012   left-axillary nodes   NEUROMA SURGERY Bilateral    NOSE SURGERY  2007   PORT-A-CATH REMOVAL     insertion and   THROAT SURGERY     TONSILLECTOMY  TOTAL KNEE ARTHROPLASTY Right 08/12/2018   Procedure: TOTAL KNEE ARTHROPLASTY-RIGHT;  Surgeon: Edie Norleen PARAS, MD;  Location: [facility];  Service: Orthopedics;  Laterality: Right;   TOTAL KNEE ARTHROPLASTY Left 09/22/2019   Procedure: TOTAL KNEE ARTHROPLASTY;  Surgeon: Edie Norleen PARAS, MD;  Location: [facility];  Service: Orthopedics;  Laterality: Left;   UVULOPALATOPHARYNGOPLASTY     Patient Active Problem List   Diagnosis Date Noted   Raynaud's disease 11/05/2022   Osteoporosis, post-menopausal 11/05/2022   Atrial flutter (HCC) 10/02/2022   First degree AV block 09/11/2022   Arthritis 08/08/2022   Persistent atrial fibrillation (HCC) 08/06/2022   Depression 02/27/2022   Gastroesophageal reflux disease 02/27/2022   Hypertension  02/27/2022   Abnormal LFTs 02/27/2022   Alcohol  abuse 02/27/2022   Atrial fibrillation with RVR (HCC) 02/27/2022   Morbid obesity (HCC) 02/27/2022   Pes anserinus bursitis of left knee 02/08/2020   Status post total knee replacement using cement, left 09/22/2019   Status post total knee replacement using cement, right 08/12/2018   Rotator cuff rupture    Status post bilateral breast reconstruction 02/06/2013   Acquired absence of bilateral breasts and nipples 09/17/2012   Breast cancer of lower-outer quadrant of left female breast (HCC) 01/16/2011    ONSET DATE: 07/23/02  REFERRING DIAG:  H37.9,U54.1X5A (ICD-10-CM) - Chemotherapy-induced peripheral neuropathy (HCC)   THERAPY DIAG:  Muscle weakness (generalized)  Other lack of coordination  Joint pain in both hands  Difficulty in walking, not elsewhere classified  Rationale for Evaluation and Treatment: Rehabilitation  SUBJECTIVE:                                                                                                                                                                                             SUBJECTIVE STATEMENT: Patient has been away for the past month. Forgot two appointments. No falls or LOB.  Pt accompanied by: self  PERTINENT HISTORY: Hypertension, Atrial Fibrillation, First degree AV block, Atrial flutter, Raynaud's disease, GERD, Arthritis, Obesity, Breast cancer, Depression, alcohol  abuse  PAIN:  Are you having pain? No  PRECAUTIONS: None  RED FLAGS: None   WEIGHT BEARING RESTRICTIONS: No  FALLS: Has patient fallen in last 6 months? No  LIVING ENVIRONMENT: Lives with: lives with their spouse Lives in: House/apartment Stairs:  5 steps outside front, 3 steps outside back, 13 steps inside, handrails on one side on all stairs Has following equipment at home: Single point cane  PLOF: Independent  PATIENT GOALS: Improve short term memory and be able to work in the yard  efficiently  OBJECTIVE:  Note: Objective measures were completed at Evaluation unless otherwise  noted.  DIAGNOSTIC FINDINGS: CT Cardiac Morph/Pulm Vein 06/02/23: 1. No acute extra cardiac abnormality. 2. Mild stable prominence of the main pulmonary arteries which may indicate degree of pulmonary arterial hypertension. 3. Aortic atherosclerosis.   COGNITION: Overall cognitive status: Within functional limits for tasks assessed   SENSATION: Not tested  COORDINATION: Not tested  EDEMA:  Not measured   MUSCLE TONE: Not tested  MUSCLE LENGTH: Not measured  DTRs:  Not evaluated  POSTURE: rounded shoulders  LOWER EXTREMITY ROM:     Active  Right Eval Left Eval  Hip flexion    Hip extension    Hip abduction    Hip adduction    Hip internal rotation    Hip external rotation    Knee flexion    Knee extension    Ankle dorsiflexion    Ankle plantarflexion    Ankle inversion    Ankle eversion     (Blank rows = not tested)  LOWER EXTREMITY MMT:    MMT Right Eval Left Eval  Hip flexion 4 4  Hip extension    Hip abduction    Hip adduction    Hip internal rotation    Hip external rotation    Knee flexion    Knee extension 5 5  Ankle dorsiflexion 4 4  Ankle plantarflexion    Ankle inversion    Ankle eversion    (Blank rows = not tested)  TRANSFERS: Assistive device utilized: None  Sit to stand: CGA Stand to sit: CGA Chair to chair: CGA  RAMP:  Ramp Comments: Not tested this visit  CURB:  Curb Comments: Not tested this vist  STAIRS:  Comments: Not observed this visit  GAIT: Gait pattern: WFL Assistive device utilized: None Level of assistance: CGA  FUNCTIONAL TESTS:  Berg Balance Scale: 43     PATIENT SURVEYS:  FOTO 76  TODAY'S TREATMENT:        Nustep BLE/BUE level 1-3 x 5 min cues for proper ROM to reduce pain in bil hands and shoulders.   Seated therex:  From wedge: heel raise x 20  From wedge: toe raise x 20  HS curl x 15 RTB   LAQ x 15 RTB  Hip abduction RTB x 15  Hip flexion RTB x 20  Alphabet each LE   From rocker board  AP rock 2x 20  Static hold 2 x 30 sec     Standing with CGA next to support surface:  Airex pad: static stand 30 seconds x 2 trials, noticeable trembling of ankles/LE's with fatigue and challenge to maintain stability Airex pad: horizontal head turns 30 seconds scanning room 10x ; cueing for arc of motion  Airex pad: vertical head turns 30 seconds, cueing for arc of motion, noticeable sway with upward gaze increasing demand on ankle righting reaction musculature Airex pad: one foot on 6 step one foot on airex pad, hold position for 30 seconds, switch legs, 2x each LE;    Shoes and socks doffed: Great toe extension 10x each LE Toes 2-5 extension  Great toe abduction assistance 10x each side Toe curls/towel curls 10x; 3 sets   Min cues for step width and use of visual feedback to improve symmetry of foot contact location due to depth perception issues with use of mirror.   EDUCATION Education details: POC Person educated: Patient Education method: Explanation Education comprehension: verbalized understanding   HOME EXERCISE PROGRAM: Access Code: [facility] URL: [email or web address] Date: 07/05/2023 Prepared by: Massie Dollar  Exercises - Standing  Single Leg Stance with Counter Support  - 1 x daily - 7 x weekly - 3 sets - 30 second hold - Mini Squat with Counter Support  - 1 x daily - 4 x weekly - 2 sets - 10 reps - Standing Hip Abduction with Counter Support  - 1 x daily - 4 x weekly - 2 sets - 10 reps - 2 hold - Standing Hip Extension with Counter Support  - 1 x daily - 4 x weekly - 2 sets - 10 reps - 2 hold - Seated Hip Abduction with Resistance  - 1 x daily - 4 x weekly - 3 sets - 10 reps - Seated Knee Lifts with Resistance  - 1 x daily - 4 x weekly - 3 sets - 10 reps - Seated Ankle Plantarflexion with Resistance  - 1 x daily - 4 x weekly - 3 sets - 10  reps - Seated Ankle Eversion with Resistance  - 1 x daily - 4 x weekly - 3 sets - 10 reps  Access Code: [facility] URL: [email or web address] Date: 08/06/2023 Prepared by: Jaan Fischel  Exercises - Standing Single Leg Stance with Counter Support  - 1 x daily - 7 x weekly - 3 sets - 30 second hold - Mini Squat with Counter Support  - 1 x daily - 4 x weekly - 2 sets - 10 reps - Standing Hip Abduction with Counter Support  - 1 x daily - 4 x weekly - 2 sets - 10 reps - 2 hold - Standing Hip Extension with Counter Support  - 1 x daily - 4 x weekly - 2 sets - 10 reps - 2 hold - Seated Hip Abduction with Resistance  - 1 x daily - 4 x weekly - 3 sets - 10 reps - Seated Knee Lifts with Resistance  - 1 x daily - 4 x weekly - 3 sets - 10 reps - Seated Ankle Plantarflexion with Resistance  - 1 x daily - 4 x weekly - 3 sets - 10 reps - Seated Ankle Eversion with Resistance  - 1 x daily - 4 x weekly - 3 sets - 10 reps - Seated Toe Towel Scrunches  - 1 x daily - 7 x weekly - 2 sets - 10 reps - 5 hold - Seated Great Toe Extension  - 1 x daily - 7 x weekly - 2 sets - 10 reps - 5 hold  GOALS: Goals reviewed with patient? Yes  SHORT TERM GOALS: Target date: 07/12/2023     Patient will be independent in home exercise program to improve strength/mobility for better functional independence with ADLs. Baseline: Administered on 11/20  Goal status: INITIAL   LONG TERM GOALS: Target date: 08/30/2023    1.  Patient (> 6 years old) will improve five times sit to stand test by at least 3 seconds indicating an increased LE strength and improved balance. Baseline: 11/20: 14.19 Goal status: INITIAL  2.  Patient will increase FOTO score to equal to or greater than  80   to demonstrate statistically significant improvement in mobility and quality of life.  Baseline: 76 Goal status: INITIAL   3.  Patient will increase Berg Balance score by > 6 points to demonstrate decreased fall risk during  functional activities. Baseline: 43 Goal status: INITIAL   4.  Patient will reduce timed up and go by at least 3 seconds to reduce fall risk and demonstrate improved transfer/gait ability. Baseline: 10 sec on 12/11 Goal status:  INITIAL  5.  Patient will increase 10 meter walk test by at least 0.13 m/s as to improve gait speed for better community ambulation and to reduce fall risk. Baseline: 12/11:23.33 sec  Goal status: INITIAL    ASSESSMENT:  CLINICAL IMPRESSION:  Patient returned after month absence. Patient introduced to towel scrunches and toe mobility and tolerated well with limited coordination.Patient arrived at an appt time that was not on schedule, was seen due to an opening.   Pt will continue to benefit from skilled physical therapy intervention to address impairments, improve QOL, and attain therapy goals.     OBJECTIVE IMPAIRMENTS: decreased balance and difficulty walking.   ACTIVITY LIMITATIONS: lifting, standing, and stairs  PARTICIPATION LIMITATIONS: yard work  PERSONAL FACTORS: 1 comorbidity: previous dx of breast cancer  are also affecting patient's functional outcome.   REHAB POTENTIAL: Good  CLINICAL DECISION MAKING: Stable/uncomplicated  EVALUATION COMPLEXITY: Low  PLAN:  PT FREQUENCY: 2x/week  PT DURATION: 12 weeks  PLANNED INTERVENTIONS: 97164- PT Re-evaluation, 97110-Therapeutic exercises, 97530- Therapeutic activity, 97112- Neuromuscular re-education, 97535- Self Care, [id number]- Manual therapy, [phone]- Gait training, Balance training, Stair training, Cryotherapy, and Moist heat  PLAN FOR NEXT SESSION:   LE strengthening, balance training, gait training, stair training   Lincoln Kleiner  Leopoldo PT ,DPT Physical Therapist- [place]  [facility]

## 2023-08-06 NOTE — Telephone Encounter (Signed)
 Marland Kitchen  left message to have patient return my call.

## 2023-08-08 ENCOUNTER — Ambulatory Visit: Payer: Medicare HMO

## 2023-08-08 ENCOUNTER — Ambulatory Visit: Payer: Medicare HMO | Admitting: Physical Therapy

## 2023-08-08 DIAGNOSIS — R278 Other lack of coordination: Secondary | ICD-10-CM | POA: Diagnosis not present

## 2023-08-08 DIAGNOSIS — M6281 Muscle weakness (generalized): Secondary | ICD-10-CM | POA: Diagnosis not present

## 2023-08-08 DIAGNOSIS — R2689 Other abnormalities of gait and mobility: Secondary | ICD-10-CM | POA: Diagnosis not present

## 2023-08-08 DIAGNOSIS — R262 Difficulty in walking, not elsewhere classified: Secondary | ICD-10-CM | POA: Diagnosis not present

## 2023-08-08 DIAGNOSIS — M25542 Pain in joints of left hand: Secondary | ICD-10-CM | POA: Diagnosis not present

## 2023-08-08 DIAGNOSIS — M25541 Pain in joints of right hand: Secondary | ICD-10-CM | POA: Diagnosis not present

## 2023-08-08 NOTE — Telephone Encounter (Signed)
   Name: Krystal [name]  DOB: [birth year]  MRN: [id number]  Primary Cardiologist: Lorine Bears, MD  Chart reviewed as part of pre-operative protocol coverage. The patient has an upcoming visit scheduled with Sherie Don, NP on 08/19/2023 at which time clearance can be addressed in case there are any issues that would impact surgical recommendations.  Removal of bilateral implants is not scheduled until TBDas below. I added preop FYI to appointment note so that provider is aware to address at time of outpatient visit.  Per office protocol the cardiology provider should forward their finalized clearance decision and recommendations regarding antiplatelet therapy to the requesting party below.    Atrial fibrilation ablation done on 05/14/2023. Requires uninterrupted DOAC for 3 months post ablation.Per office protocol, patient can hold Eliquis for 2 days prior to procedure any time after Jan 22,2025     Please call with any questions.  Joylene Grapes, NP  08/08/2023, 1:40 PM

## 2023-08-08 NOTE — Telephone Encounter (Signed)
Patient left message on triage line on 08/07/23 stating that insurance company will be sending a form to fill out for exception request for a lower tier for [facility] as she can not afford this amount, this medication is a tier 4.  Samples left up front for the patient to help her in the meantime.

## 2023-08-08 NOTE — Progress Notes (Signed)
Patient ID: Krystal [name], female    DOB: 02-09-1951, 73 y.o.   MRN: [id number]  Chief Complaint  Patient presents with   Pre-op Exam      ICD-10-CM   1. Atrial fibrillation with RVR (HCC)  I48.91     2. Malignant neoplasm of lower-outer quadrant of left breast of female, estrogen receptor positive (HCC)  C50.512    Z17.0     3. Status post bilateral breast reconstruction  Z98.890     4. Acquired absence of bilateral breasts and nipples  Z90.13        History of Present Illness: Krystal [name] is a 73 y.o.  female  with a history of bilateral mastectomies and bilateral breast reconstruction, currently has breast implants in place.  She presents for preoperative evaluation for upcoming procedure, removal of bilateral breast implants, flat closure, scheduled for 08/26/2023 with Dr. Ulice Bold.  The patient has not had problems with anesthesia.  Patient does report a history of DVT in the 70s, reports that she was on OCPs at this time.  She has not since had any issues with DVTs.  Denies any known family history of DVT on her mother side, but does not know the history of her father's side.  She does report that she has lower extremity swelling and lower extremity varicosities.  Summary of Previous Visit: 35-year-old female with history of bilateral mastectomies 2012.  She underwent expander placement with ADM in 2014.  She had implants placed July 2014.  She subsequently had right capsulotomy for implant repositioning.  She has decided that instead of getting new implants she would like to have the implants removed.  PMH Significant for:   A-fib with recent ablation on 05/14/2023.  She is currently on Eliquis and has been provided clearance from cardiology to hold this 2 days prior to surgery.  Medical clearance was not provided, will request this today.  Patient also has a history of anemia, fibromyalgia, Raynaud's disease, prediabetes, restless leg syndrome, sleep apnea, GERD,  hyperlipidemia, hypertension.  Patient reports that her sleep apnea is well-controlled, she uses CPAP machine at night without any issues.  She denies any recent changes to her health, reports she is feeling well.  She reports she is active.   Past Medical History: Allergies: Allergies  Allergen Reactions   Cephalexin Nausea Only    Diaphoresis / Sweating (intolerance)   Levaquin [Levofloxacin] Other (See Comments)    Stiff joints, unable to walk.    Current Medications:  Current Outpatient Medications:    acetaminophen-codeine (TYLENOL #3) 300-30 MG tablet, Take 1 tablet by mouth 3 (three) times daily as needed., Disp: , Rfl:    alendronate (FOSAMAX) 70 MG tablet, Take 70 mg by mouth once a week. Take with a full glass of water on an empty stomach., Disp: , Rfl:    amiodarone (PACERONE) 200 MG tablet, TAKE 1 TABLET ONE TIME DAILY . STOP PROPAFENONE, Disp: 90 tablet, Rfl: 3   apixaban (ELIQUIS) 5 MG TABS tablet, Take 1 tablet (5 mg total) by mouth 2 (two) times daily., Disp: 60 tablet, Rfl: 5   cetirizine (ZYRTEC) 10 MG tablet, Take 10 mg by mouth daily., Disp: , Rfl:    cholecalciferol (VITAMIN D3) 25 MCG (1000 UNIT) tablet, Take 1,000 Units by mouth daily., Disp: , Rfl:    diltiazem (CARDIZEM CD) 120 MG 24 hr capsule, Take 1 capsule (120 mg total) by mouth in the morning and at bedtime., Disp: 180 capsule,  Rfl: 0   DULoxetine (CYMBALTA) 60 MG capsule, Take 60 mg by mouth daily., Disp: , Rfl:    fluticasone (FLONASE) 50 MCG/ACT nasal spray, Place 2 sprays into both nostrils daily., Disp: , Rfl:    folic acid (FOLVITE) 1 MG tablet, Take 1 mg by mouth daily., Disp: , Rfl:    furosemide (LASIX) 20 MG tablet, Take 20 mg by mouth 2 (two) times daily., Disp: , Rfl:    levocetirizine (XYZAL) 5 MG tablet, Take 5 mg by mouth every evening., Disp: , Rfl:    LYSINE ACETATE PO, Take 1,000 mg by mouth daily as needed (fever blister)., Disp: , Rfl:    magnesium oxide (MAG-OX) 400 (240 Mg) MG  tablet, Take 400 mg by mouth daily as needed (low magnesium)., Disp: , Rfl:    omeprazole (PRILOSEC) 20 MG capsule, Take 20 mg by mouth daily., Disp: , Rfl:    potassium chloride SA (KLOR-CON M) 20 MEQ tablet, Take 1 tablet (20 mEq total) by mouth 2 (two) times daily., Disp: 180 tablet, Rfl: 3   pregabalin (LYRICA) 300 MG capsule, Take 300 mg by mouth 2 (two) times daily., Disp: , Rfl:    rOPINIRole (REQUIP) 1 MG tablet, Take 1 mg by mouth at bedtime., Disp: , Rfl:    triamcinolone (KENALOG) 0.025 % cream, Apply 1 Application topically as needed (flare - up)., Disp: , Rfl:    Vibegron (GEMTESA) 75 MG TABS, Take 75 mg by mouth daily., Disp: , Rfl:    Vitamin D, Ergocalciferol, (DRISDOL) 1.25 MG (50000 UNIT) CAPS capsule, Take 50,000 Units by mouth every 7 (seven) days., Disp: , Rfl:    zolpidem (AMBIEN) 10 MG tablet, Take 10 mg by mouth at bedtime., Disp: , Rfl:    pantoprazole (PROTONIX) 40 MG tablet, Take 1 tablet (40 mg total) by mouth daily., Disp: 45 tablet, Rfl: 0  Past Medical Problems: Past Medical History:  Diagnosis Date   Anemia    Arthritis    osteo   Blindness of right eye at birth   Cancer [facility]) 2011   Left breast 2011 and cervical age 13   Carpal tunnel syndrome    Cataract    Depression    Edema 07/27/2011   Fibromyalgia    muscle weakness and pain   GERD (gastroesophageal reflux disease)    Hyperlipidemia    Hypertension    benign   Lymphedema    Osteoporosis    Plantar fasciitis    Pre-diabetes    Raynaud's disease    Restless leg syndrome    Rosacea    Rotator cuff rupture    Sleep apnea    Synovitis    Ulcer     Past Surgical History: Past Surgical History:  Procedure Laterality Date   ABDOMINAL HYSTERECTOMY  1986   For bleeding and pain   ATRIAL FIBRILLATION ABLATION N/A 05/14/2023   Procedure: ATRIAL FIBRILLATION ABLATION;  Surgeon: Lanier Prude, MD;  Location: [facility];  Service: Cardiovascular;  Laterality: N/A;   BLADDER SURGERY   2006   Bladder tack   BREAST RECONSTRUCTION Bilateral 09/17/2012   Procedure: BREAST RECONSTRUCTION;  Surgeon: Wayland Denis, DO;  Location: [facility];  Service: Plastics;  Laterality: Bilateral;  BILATERAL BREAST RECONSTRUCTION WITH TISSUE EXPANDERS AND ALLOMED   BREAST RECONSTRUCTION Right 06/03/2013   Procedure: RIGHT BREAST CAPSULE CONSTRACTURE;  Surgeon: Wayland Denis, DO;  Location:  SURGERY CENTER;  Service: Plastics;  Laterality: Right;   CARDIOVERSION N/A 08/06/2022  Procedure: CARDIOVERSION;  Surgeon: Iran Ouch, MD;  Location: [facility];  Service: Cardiovascular;  Laterality: N/A;   CARDIOVERSION N/A 10/02/2022   Procedure: CARDIOVERSION;  Surgeon: Yvonne Kendall, MD;  Location: [facility];  Service: Cardiovascular;  Laterality: N/A;   CARPAL TUNNEL RELEASE Bilateral 2008   EYE SURGERY Right    INJECTION KNEE Left 08/12/2018   Procedure: KNEE INJECTION-LEFT;  Surgeon: Christena Flake, MD;  Location: [facility];  Service: Orthopedics;  Laterality: Left;   JOINT REPLACEMENT     LIPOSUCTION Bilateral 01/29/2013   Procedure: LIPOSUCTION;  Surgeon: Wayland Denis, DO;  Location: [facility];  Service: Plastics;  Laterality: Bilateral;   LIPOSUCTION Right 06/03/2013   Procedure: LIPOSUCTION;  Surgeon: Wayland Denis, DO;  Location: [facility];  Service: Plastics;  Laterality: Right;   MASTECTOMY  2012   rt prophalactic mast-snbx   MASTECTOMY MODIFIED RADICAL  2012   left-axillary nodes   NEUROMA SURGERY Bilateral    NOSE SURGERY  2007   PORT-A-CATH REMOVAL     insertion and   THROAT SURGERY     TONSILLECTOMY     TOTAL KNEE ARTHROPLASTY Right 08/12/2018   Procedure: TOTAL KNEE ARTHROPLASTY-RIGHT;  Surgeon: Christena Flake, MD;  Location: [facility];  Service: Orthopedics;  Laterality: Right;   TOTAL KNEE ARTHROPLASTY Left 09/22/2019   Procedure: TOTAL KNEE ARTHROPLASTY;  Surgeon: Christena Flake, MD;  Location: [facility];  Service:  Orthopedics;  Laterality: Left;   UVULOPALATOPHARYNGOPLASTY      Social History: Social History   Socioeconomic History   Marital status: Married    Spouse name: Not on file   Number of children: Not on file   Years of education: Not on file   Highest education level: Not on file  Occupational History   Not on file  Tobacco Use   Smoking status: Never    Passive exposure: Never   Smokeless tobacco: Never  Vaping Use   Vaping status: Never Used  Substance and Sexual Activity   Alcohol use: Not Currently   Drug use: No   Sexual activity: Yes  Other Topics Concern   Not on file  Social History Narrative   Not on file   Social Drivers of Health   Financial Resource Strain: Not on file  Food Insecurity: Not on file  Transportation Needs: Not on file  Physical Activity: Not on file  Stress: Not on file  Social Connections: Not on file  Intimate Partner Violence: Not on file    Family History: Family History  Problem Relation Age of Onset   Cancer Mother 73       non hodgkins lymphoma   Cancer Father        neck, throat, lung   Lung cancer Father    Throat cancer Father    Hypertension Sister    Cancer Sister 24       Breast cancer dx'd 04/2011   Hypertension Brother    Breast cancer Maternal Aunt    Liver cancer Maternal Uncle    Cancer Maternal Grandmother        died 44, unknown cancer   Cancer Maternal Grandfather        died in his 86s; unknown cancer   Lung cancer Maternal Uncle    Throat cancer Maternal Uncle    Breast cancer Maternal Aunt    Cancer Cousin        died under the age 67, unknown cancer   Cancer Cousin  died age 63; unknown cancer    Review of Systems: Review of Systems  Constitutional: Negative.   Respiratory: Negative.    Cardiovascular: Negative.   Gastrointestinal: Negative.   Neurological: Negative.     Physical Exam: Vital Signs BP (!) 155/74 (BP Location: Left Arm, Patient Position: Sitting, Cuff Size: Large)    Pulse 79   SpO2 96%   Physical Exam Constitutional:      General: Not in acute distress.    Appearance: Normal appearance. Not ill-appearing.  HENT:     Head: Normocephalic and atraumatic.  Eyes:     Pupils: Pupils are equal, round Neck:     Musculoskeletal: Normal range of motion.  Cardiovascular:     Rate and Rhythm: Normal rate    Pulses: Normal pulses.  Pulmonary:     Effort: Pulmonary effort is normal. No respiratory distress.  Abdominal:     General: Abdomen is flat. There is no distension.  Musculoskeletal: Normal range of motion.  Skin:    General: Skin is warm and dry.     Findings: No erythema or rash.  Neurological:     General: No focal deficit present.     Mental Status: Alert and oriented to person, place, and time. Mental status is at baseline.     Motor: No weakness.  Psychiatric:        Mood and Affect: Mood normal.        Behavior: Behavior normal.    Assessment/Plan: The patient is scheduled for Removal of bilateral breast implants, flat closure with Dr. Ulice Bold.  Risks, benefits, and alternatives of procedure discussed, questions answered and consent obtained.    Smoking Status: Non-smoker; Counseling Given?  N/A Last Mammogram: Status post bilateral mastectomy  Caprini Score: 12, highest; Risk Factors include: Age, history of varicose veins, history of DVT while on OCPs in 1970.  History of breast cancer.  Chronic lower extremity swelling. BMI > 25, and length of planned surgery. Recommendation for mechanical and pharmacological prophylaxis. Encourage early ambulation.  Patient is on Eliquis which she takes daily for her atrial fibrillation, she recently had an ablation.  She will stop this 2 days prior to surgery and restart 24 hours after surgery.  Will discuss with Dr. Ulice Bold any additional need for postoperative or preoperative Lovenox.  Pictures obtained: @consult   Post-op Rx sent to pharmacy: Oxycodone, Zofran, Keflex  Patient was  provided with the General Surgical Risk consent document and Pain Medication Agreement prior to their appointment.  They had adequate time to read through the risk consent documents and Pain Medication Agreement. We also discussed them in person together during this preop appointment. All of their questions were answered to their satisfaction.  Recommended calling if they have any further questions.  Risk consent form and Pain Medication Agreement to be scanned into patient's chart.  Discussed with patient that cardiology recommends holding her Eliquis 2 days prior to surgery.  We discussed that she can restart this 24 hours after surgery.  We also discussed recommendations for holding vitamin D and multivitamin 1 week prior to surgery.   Electronically signed by: Kermit Balo Shavonn Convey, PA-C 08/09/2023 10:53 AM

## 2023-08-08 NOTE — Therapy (Signed)
OUTPATIENT PHYSICAL THERAPY NEURO TREATMENT   Patient Name: Krystal [name] MRN: [id number] DOB:[birth year], 73 y.o., female Today's Date: 08/08/2023   PCP: Jerl Mina, MD REFERRING PROVIDER: Loa Socks, NP  END OF SESSION:  PT End of Session - 08/08/23 0910     Visit Number 8    Number of Visits 24    Date for PT Re-Evaluation 08/30/23    Progress Note Due on Visit 10    PT Start Time 0902    PT Stop Time 0926    PT Time Calculation (min) 24 min    Equipment Utilized During Treatment Gait belt    Activity Tolerance Patient tolerated treatment well                 Past Medical History:  Diagnosis Date   Anemia    Arthritis    osteo   Blindness of right eye at birth   Cancer [facility]) 2011   Left breast 2011 and cervical age 54   Carpal tunnel syndrome    Cataract    Depression    Edema 07/27/2011   Fibromyalgia    muscle weakness and pain   GERD (gastroesophageal reflux disease)    Hyperlipidemia    Hypertension    benign   Lymphedema    Osteoporosis    Plantar fasciitis    Pre-diabetes    Raynaud's disease    Restless leg syndrome    Rosacea    Rotator cuff rupture    Sleep apnea    Synovitis    Ulcer    Past Surgical History:  Procedure Laterality Date   ABDOMINAL HYSTERECTOMY  1986   For bleeding and pain   ATRIAL FIBRILLATION ABLATION N/A 05/14/2023   Procedure: ATRIAL FIBRILLATION ABLATION;  Surgeon: Lanier Prude, MD;  Location: [facility];  Service: Cardiovascular;  Laterality: N/A;   BLADDER SURGERY  2006   Bladder tack   BREAST RECONSTRUCTION Bilateral 09/17/2012   Procedure: BREAST RECONSTRUCTION;  Surgeon: Wayland Denis, DO;  Location: [facility];  Service: Plastics;  Laterality: Bilateral;  BILATERAL BREAST RECONSTRUCTION WITH TISSUE EXPANDERS AND ALLOMED   BREAST RECONSTRUCTION Right 06/03/2013   Procedure: RIGHT BREAST CAPSULE CONSTRACTURE;  Surgeon: Wayland Denis, DO;  Location: [facility];  Service: Plastics;  Laterality: Right;   CARDIOVERSION N/A 08/06/2022   Procedure: CARDIOVERSION;  Surgeon: Iran Ouch, MD;  Location: [facility];  Service: Cardiovascular;  Laterality: N/A;   CARDIOVERSION N/A 10/02/2022   Procedure: CARDIOVERSION;  Surgeon: Yvonne Kendall, MD;  Location: [facility];  Service: Cardiovascular;  Laterality: N/A;   CARPAL TUNNEL RELEASE Bilateral 2008   EYE SURGERY Right    INJECTION KNEE Left 08/12/2018   Procedure: KNEE INJECTION-LEFT;  Surgeon: Christena Flake, MD;  Location: [facility];  Service: Orthopedics;  Laterality: Left;   JOINT REPLACEMENT     LIPOSUCTION Bilateral 01/29/2013   Procedure: LIPOSUCTION;  Surgeon: Wayland Denis, DO;  Location: [facility];  Service: Plastics;  Laterality: Bilateral;   LIPOSUCTION Right 06/03/2013   Procedure: LIPOSUCTION;  Surgeon: Wayland Denis, DO;  Location: [facility];  Service: Plastics;  Laterality: Right;   MASTECTOMY  2012   rt prophalactic mast-snbx   MASTECTOMY MODIFIED RADICAL  2012   left-axillary nodes   NEUROMA SURGERY Bilateral    NOSE SURGERY  2007   PORT-A-CATH REMOVAL     insertion and   THROAT SURGERY     TONSILLECTOMY  TOTAL KNEE ARTHROPLASTY Right 08/12/2018   Procedure: TOTAL KNEE ARTHROPLASTY-RIGHT;  Surgeon: Christena Flake, MD;  Location: [facility];  Service: Orthopedics;  Laterality: Right;   TOTAL KNEE ARTHROPLASTY Left 09/22/2019   Procedure: TOTAL KNEE ARTHROPLASTY;  Surgeon: Christena Flake, MD;  Location: [facility];  Service: Orthopedics;  Laterality: Left;   UVULOPALATOPHARYNGOPLASTY     Patient Active Problem List   Diagnosis Date Noted   Raynaud's disease 11/05/2022   Osteoporosis, post-menopausal 11/05/2022   Atrial flutter (HCC) 10/02/2022   First degree AV block 09/11/2022   Arthritis 08/08/2022   Persistent atrial fibrillation (HCC) 08/06/2022   Depression 02/27/2022   Gastroesophageal reflux disease 02/27/2022   Hypertension  02/27/2022   Abnormal LFTs 02/27/2022   Alcohol abuse 02/27/2022   Atrial fibrillation with RVR (HCC) 02/27/2022   Morbid obesity (HCC) 02/27/2022   Pes anserinus bursitis of left knee 02/08/2020   Status post total knee replacement using cement, left 09/22/2019   Status post total knee replacement using cement, right 08/12/2018   Rotator cuff rupture    Status post bilateral breast reconstruction 02/06/2013   Acquired absence of bilateral breasts and nipples 09/17/2012   Breast cancer of lower-outer quadrant of left female breast (HCC) 01/16/2011    ONSET DATE: 07/23/02  REFERRING DIAG:  U27.2,Z36.1X5A (ICD-10-CM) - Chemotherapy-induced peripheral neuropathy (HCC)   THERAPY DIAG:  Muscle weakness (generalized)  Rationale for Evaluation and Treatment: Rehabilitation  SUBJECTIVE:                                                                                                                                                                                             SUBJECTIVE STATEMENT: Pt reports no updates, no current or new concerns, no recent falls.  Pt accompanied by: self  PERTINENT HISTORY: Hypertension, Atrial Fibrillation, First degree AV block, Atrial flutter, Raynaud's disease, GERD, Arthritis, Obesity, Breast cancer, Depression, alcohol abuse  PAIN:  Are you having pain? No  PRECAUTIONS: None  RED FLAGS: None   WEIGHT BEARING RESTRICTIONS: No  FALLS: Has patient fallen in last 6 months? No  LIVING ENVIRONMENT: Lives with: lives with their spouse Lives in: House/apartment Stairs:  5 steps outside front, 3 steps outside back, 13 steps inside, handrails on one side on all stairs Has following equipment at home: Single point cane  PLOF: Independent  PATIENT GOALS: Improve short term memory and be able to work in the yard efficiently  OBJECTIVE:  Note: Objective measures were completed at Evaluation unless otherwise noted.  DIAGNOSTIC FINDINGS: CT Cardiac  Morph/Pulm Vein 06/02/23: 1. No acute extra cardiac abnormality. 2. Mild stable prominence of the  main pulmonary arteries which may indicate degree of pulmonary arterial hypertension. 3. Aortic atherosclerosis.   COGNITION: Overall cognitive status: Within functional limits for tasks assessed   SENSATION: Not tested  COORDINATION: Not tested  EDEMA:  Not measured   MUSCLE TONE: Not tested  MUSCLE LENGTH: Not measured  DTRs:  Not evaluated  POSTURE: rounded shoulders  LOWER EXTREMITY ROM:     Active  Right Eval Left Eval  Hip flexion    Hip extension    Hip abduction    Hip adduction    Hip internal rotation    Hip external rotation    Knee flexion    Knee extension    Ankle dorsiflexion    Ankle plantarflexion    Ankle inversion    Ankle eversion     (Blank rows = not tested)  LOWER EXTREMITY MMT:    MMT Right Eval Left Eval  Hip flexion 4 4  Hip extension    Hip abduction    Hip adduction    Hip internal rotation    Hip external rotation    Knee flexion    Knee extension 5 5  Ankle dorsiflexion 4 4  Ankle plantarflexion    Ankle inversion    Ankle eversion    (Blank rows = not tested)  TRANSFERS: Assistive device utilized: None  Sit to stand: CGA Stand to sit: CGA Chair to chair: CGA  RAMP:  Ramp Comments: Not tested this visit  CURB:  Curb Comments: Not tested this vist  STAIRS:  Comments: Not observed this visit  GAIT: Gait pattern: WFL Assistive device utilized: None Level of assistance: CGA  FUNCTIONAL TESTS:  Berg Balance Scale: 43     PATIENT SURVEYS:  FOTO 76  TODAY'S TREATMENT:        TA: Stairs ascend/descend x 5 min, cuing for technique. With use of handrails. Cuing for hand placement and stepping-technique    TE: Nustep BLE level 1-3 x 5 min cues for proper ROM to reduce pain in bil hands and shoulders.    Seated therex:   heel raise x 20  toe raise x 20  March 20x each LE  LAQ 20x each LE   At  support surface with UE support: Standing hamstring curl 20x each LE  Standing hip abd 15x Standing hip ext 15x Mini squat 10x   EDUCATION Education details: POC Person educated: Patient Education method: Explanation Education comprehension: verbalized understanding   HOME EXERCISE PROGRAM: Access Code: [facility] URL: [email or web address] Date: 07/05/2023 Prepared by: Grier Rocher  Exercises - Standing Single Leg Stance with Counter Support  - 1 x daily - 7 x weekly - 3 sets - 30 second hold - Mini Squat with Counter Support  - 1 x daily - 4 x weekly - 2 sets - 10 reps - Standing Hip Abduction with Counter Support  - 1 x daily - 4 x weekly - 2 sets - 10 reps - 2 hold - Standing Hip Extension with Counter Support  - 1 x daily - 4 x weekly - 2 sets - 10 reps - 2 hold - Seated Hip Abduction with Resistance  - 1 x daily - 4 x weekly - 3 sets - 10 reps - Seated Knee Lifts with Resistance  - 1 x daily - 4 x weekly - 3 sets - 10 reps - Seated Ankle Plantarflexion with Resistance  - 1 x daily - 4 x weekly - 3 sets - 10 reps - Seated Ankle Eversion with  Resistance  - 1 x daily - 4 x weekly - 3 sets - 10 reps  Access Code: [facility] URL: [email or web address] Date: 08/06/2023 Prepared by: Precious Bard  Exercises - Standing Single Leg Stance with Counter Support  - 1 x daily - 7 x weekly - 3 sets - 30 second hold - Mini Squat with Counter Support  - 1 x daily - 4 x weekly - 2 sets - 10 reps - Standing Hip Abduction with Counter Support  - 1 x daily - 4 x weekly - 2 sets - 10 reps - 2 hold - Standing Hip Extension with Counter Support  - 1 x daily - 4 x weekly - 2 sets - 10 reps - 2 hold - Seated Hip Abduction with Resistance  - 1 x daily - 4 x weekly - 3 sets - 10 reps - Seated Knee Lifts with Resistance  - 1 x daily - 4 x weekly - 3 sets - 10 reps - Seated Ankle Plantarflexion with Resistance  - 1 x daily - 4 x weekly - 3 sets - 10 reps - Seated Ankle  Eversion with Resistance  - 1 x daily - 4 x weekly - 3 sets - 10 reps - Seated Toe Towel Scrunches  - 1 x daily - 7 x weekly - 2 sets - 10 reps - 5 hold - Seated Great Toe Extension  - 1 x daily - 7 x weekly - 2 sets - 10 reps - 5 hold  GOALS: Goals reviewed with patient? Yes  SHORT TERM GOALS: Target date: 07/12/2023     Patient will be independent in home exercise program to improve strength/mobility for better functional independence with ADLs. Baseline: Administered on 11/20  Goal status: INITIAL   LONG TERM GOALS: Target date: 08/30/2023    1.  Patient (> 90 years old) will improve five times sit to stand test by at least 3 seconds indicating an increased LE strength and improved balance. Baseline: 11/20: 14.19 Goal status: INITIAL  2.  Patient will increase FOTO score to equal to or greater than  80   to demonstrate statistically significant improvement in mobility and quality of life.  Baseline: 76 Goal status: INITIAL   3.  Patient will increase Berg Balance score by > 6 points to demonstrate decreased fall risk during functional activities. Baseline: 43 Goal status: INITIAL   4.  Patient will reduce timed up and go by at least 3 seconds to reduce fall risk and demonstrate improved transfer/gait ability. Baseline: 10 sec on 12/11 Goal status: INITIAL  5.  Patient will increase 10 meter walk test by at least 0.13 m/s as to improve gait speed for better community ambulation and to reduce fall risk. Baseline: 12/11:23.33 sec  Goal status: INITIAL    ASSESSMENT:  CLINICAL IMPRESSION:  Session limited today due to patient arriving at wrong time, seen as soon as possible in this open appointment slot. PT followed plan as laid out in recent sessions with addition of stair training as pt reported decreased confidence in balance with this activity.  Pt tolerated all interventions well without significant fatigue and without pain.  Pt will continue to benefit from skilled  physical therapy intervention to address impairments, improve QOL, and attain therapy goals.     OBJECTIVE IMPAIRMENTS: decreased balance and difficulty walking.   ACTIVITY LIMITATIONS: lifting, standing, and stairs  PARTICIPATION LIMITATIONS: yard work  PERSONAL FACTORS: 1 comorbidity: previous dx of breast cancer  are also affecting patient's functional  outcome.   REHAB POTENTIAL: Good  CLINICAL DECISION MAKING: Stable/uncomplicated  EVALUATION COMPLEXITY: Low  PLAN:  PT FREQUENCY: 2x/week  PT DURATION: 12 weeks  PLANNED INTERVENTIONS: 97164- PT Re-evaluation, 97110-Therapeutic exercises, 97530- Therapeutic activity, 97112- Neuromuscular re-education, 97535- Self Care, 16109- Manual therapy, [phone]- Gait training, Balance training, Stair training, Cryotherapy, and Moist heat  PLAN FOR NEXT SESSION:   LE strengthening, balance training, gait training, stair training   Baird Kay PT ,DPT Physical Therapist- [place]  [facility]

## 2023-08-09 ENCOUNTER — Encounter: Payer: Self-pay | Admitting: Surgical

## 2023-08-09 ENCOUNTER — Ambulatory Visit (INDEPENDENT_AMBULATORY_CARE_PROVIDER_SITE_OTHER): Payer: Medicare HMO | Admitting: Surgical

## 2023-08-09 VITALS — BP 155/74 | HR 79

## 2023-08-09 DIAGNOSIS — Z9889 Other specified postprocedural states: Secondary | ICD-10-CM

## 2023-08-09 DIAGNOSIS — Z9013 Acquired absence of bilateral breasts and nipples: Secondary | ICD-10-CM

## 2023-08-09 DIAGNOSIS — C50512 Malignant neoplasm of lower-outer quadrant of left female breast: Secondary | ICD-10-CM

## 2023-08-09 DIAGNOSIS — I4891 Unspecified atrial fibrillation: Secondary | ICD-10-CM

## 2023-08-09 DIAGNOSIS — Z170 Estrogen receptor positive status [ER+]: Secondary | ICD-10-CM

## 2023-08-09 MED ORDER — CEPHALEXIN 500 MG PO CAPS: 500.0000 mg | ORAL_CAPSULE | Freq: Four times a day (QID) | ORAL | 0 refills | Status: AC

## 2023-08-09 MED ORDER — OXYBUTYNIN CHLORIDE ER 10 MG PO TB24: 10.0000 mg | ORAL_TABLET | Freq: Every day | ORAL | 1 refills | Status: DC

## 2023-08-09 MED ORDER — OXYCODONE HCL 5 MG PO TABS: 5.0000 mg | ORAL_TABLET | Freq: Four times a day (QID) | ORAL | 0 refills | Status: AC | PRN

## 2023-08-09 MED ORDER — ONDANSETRON HCL 4 MG PO TABS: 4.0000 mg | ORAL_TABLET | Freq: Three times a day (TID) | ORAL | 0 refills | Status: DC | PRN

## 2023-08-09 NOTE — Telephone Encounter (Signed)
I have not seen this patient in more than a year.  I am forwarding to the preop pool and EP.

## 2023-08-09 NOTE — Addendum Note (Signed)
Addended by: Sueanne Margarita on: 08/09/2023 03:54 PM   Modules accepted: Orders

## 2023-08-09 NOTE — Telephone Encounter (Signed)
Spoke with patient and advised results rx sent to pharmacy by e-script  

## 2023-08-09 NOTE — Telephone Encounter (Signed)
Please advise which medication patient can switch to?

## 2023-08-12 ENCOUNTER — Ambulatory Visit: Payer: Medicare HMO

## 2023-08-12 ENCOUNTER — Ambulatory Visit: Payer: Medicare HMO | Admitting: Urology

## 2023-08-12 DIAGNOSIS — M25541 Pain in joints of right hand: Secondary | ICD-10-CM | POA: Diagnosis not present

## 2023-08-12 DIAGNOSIS — R278 Other lack of coordination: Secondary | ICD-10-CM | POA: Diagnosis not present

## 2023-08-12 DIAGNOSIS — M25542 Pain in joints of left hand: Secondary | ICD-10-CM | POA: Diagnosis not present

## 2023-08-12 DIAGNOSIS — R262 Difficulty in walking, not elsewhere classified: Secondary | ICD-10-CM | POA: Diagnosis not present

## 2023-08-12 DIAGNOSIS — M6281 Muscle weakness (generalized): Secondary | ICD-10-CM | POA: Diagnosis not present

## 2023-08-12 DIAGNOSIS — R2689 Other abnormalities of gait and mobility: Secondary | ICD-10-CM | POA: Diagnosis not present

## 2023-08-12 NOTE — Therapy (Signed)
OUTPATIENT PHYSICAL THERAPY NEURO TREATMENT   Patient Name: Krystal [name] MRN: [id number] DOB:01-28-1951, 73 y.o., female Today's Date: 08/13/2023   PCP: Jerl Mina, MD REFERRING PROVIDER: Loa Socks, NP  END OF SESSION:  PT End of Session - 08/12/23 1526     Visit Number 9    Number of Visits 24    Date for PT Re-Evaluation 08/30/23    Progress Note Due on Visit 10    PT Start Time 1522    PT Stop Time 1610    PT Time Calculation (min) 48 min    Equipment Utilized During Treatment Gait belt    Activity Tolerance Patient tolerated treatment well    Behavior During Therapy WFL for tasks assessed/performed                  Past Medical History:  Diagnosis Date   Anemia    Arthritis    osteo   Blindness of right eye at birth   Cancer [facility]) 2011   Left breast 2011 and cervical age 43   Carpal tunnel syndrome    Cataract    Depression    Edema 07/27/2011   Fibromyalgia    muscle weakness and pain   GERD (gastroesophageal reflux disease)    Hyperlipidemia    Hypertension    benign   Lymphedema    Osteoporosis    Plantar fasciitis    Pre-diabetes    Raynaud's disease    Restless leg syndrome    Rosacea    Rotator cuff rupture    Sleep apnea    Synovitis    Ulcer    Past Surgical History:  Procedure Laterality Date   ABDOMINAL HYSTERECTOMY  1986   For bleeding and pain   ATRIAL FIBRILLATION ABLATION N/A 05/14/2023   Procedure: ATRIAL FIBRILLATION ABLATION;  Surgeon: Lanier Prude, MD;  Location: [facility];  Service: Cardiovascular;  Laterality: N/A;   BLADDER SURGERY  2006   Bladder tack   BREAST RECONSTRUCTION Bilateral 09/17/2012   Procedure: BREAST RECONSTRUCTION;  Surgeon: Wayland Denis, DO;  Location: [facility];  Service: Plastics;  Laterality: Bilateral;  BILATERAL BREAST RECONSTRUCTION WITH TISSUE EXPANDERS AND ALLOMED   BREAST RECONSTRUCTION Right 06/03/2013   Procedure: RIGHT BREAST CAPSULE  CONSTRACTURE;  Surgeon: Wayland Denis, DO;  Location: [facility];  Service: Plastics;  Laterality: Right;   CARDIOVERSION N/A 08/06/2022   Procedure: CARDIOVERSION;  Surgeon: Iran Ouch, MD;  Location: [facility];  Service: Cardiovascular;  Laterality: N/A;   CARDIOVERSION N/A 10/02/2022   Procedure: CARDIOVERSION;  Surgeon: Yvonne Kendall, MD;  Location: [facility];  Service: Cardiovascular;  Laterality: N/A;   CARPAL TUNNEL RELEASE Bilateral 2008   EYE SURGERY Right    INJECTION KNEE Left 08/12/2018   Procedure: KNEE INJECTION-LEFT;  Surgeon: Christena Flake, MD;  Location: [facility];  Service: Orthopedics;  Laterality: Left;   JOINT REPLACEMENT     LIPOSUCTION Bilateral 01/29/2013   Procedure: LIPOSUCTION;  Surgeon: Wayland Denis, DO;  Location: [facility];  Service: Plastics;  Laterality: Bilateral;   LIPOSUCTION Right 06/03/2013   Procedure: LIPOSUCTION;  Surgeon: Wayland Denis, DO;  Location: [facility];  Service: Plastics;  Laterality: Right;   MASTECTOMY  2012   rt prophalactic mast-snbx   MASTECTOMY MODIFIED RADICAL  2012   left-axillary nodes   NEUROMA SURGERY Bilateral    NOSE SURGERY  2007   PORT-A-CATH REMOVAL     insertion and  THROAT SURGERY     TONSILLECTOMY     TOTAL KNEE ARTHROPLASTY Right 08/12/2018   Procedure: TOTAL KNEE ARTHROPLASTY-RIGHT;  Surgeon: Christena Flake, MD;  Location: [facility];  Service: Orthopedics;  Laterality: Right;   TOTAL KNEE ARTHROPLASTY Left 09/22/2019   Procedure: TOTAL KNEE ARTHROPLASTY;  Surgeon: Christena Flake, MD;  Location: [facility];  Service: Orthopedics;  Laterality: Left;   UVULOPALATOPHARYNGOPLASTY     Patient Active Problem List   Diagnosis Date Noted   Raynaud's disease 11/05/2022   Osteoporosis, post-menopausal 11/05/2022   Atrial flutter (HCC) 10/02/2022   First degree AV block 09/11/2022   Arthritis 08/08/2022   Persistent atrial fibrillation (HCC) 08/06/2022   Depression 02/27/2022    Gastroesophageal reflux disease 02/27/2022   Hypertension 02/27/2022   Abnormal LFTs 02/27/2022   Alcohol abuse 02/27/2022   Atrial fibrillation with RVR (HCC) 02/27/2022   Morbid obesity (HCC) 02/27/2022   Pes anserinus bursitis of left knee 02/08/2020   Status post total knee replacement using cement, left 09/22/2019   Status post total knee replacement using cement, right 08/12/2018   Rotator cuff rupture    Status post bilateral breast reconstruction 02/06/2013   Acquired absence of bilateral breasts and nipples 09/17/2012   Breast cancer of lower-outer quadrant of left female breast (HCC) 01/16/2011    ONSET DATE: 07/23/02  REFERRING DIAG:  U44.0,H47.1X5A (ICD-10-CM) - Chemotherapy-induced peripheral neuropathy (HCC)   THERAPY DIAG:  Muscle weakness (generalized)  Other lack of coordination  Difficulty in walking, not elsewhere classified  Other abnormalities of gait and mobility  Rationale for Evaluation and Treatment: Rehabilitation  SUBJECTIVE:                                                                                                                                                                                             SUBJECTIVE STATEMENT: Patient reports she has been having to do a lot of steps over the weekend and reports hip are sore. Denies any recent falls. Reports fearful of falling down stairs.   Pt accompanied by: self  PERTINENT HISTORY: Hypertension, Atrial Fibrillation, First degree AV block, Atrial flutter, Raynaud's disease, GERD, Arthritis, Obesity, Breast cancer, Depression, alcohol abuse  PAIN:  Are you having pain? No  PRECAUTIONS: None  RED FLAGS: None   WEIGHT BEARING RESTRICTIONS: No  FALLS: Has patient fallen in last 6 months? No  LIVING ENVIRONMENT: Lives with: lives with their spouse Lives in: House/apartment Stairs:  5 steps outside front, 3 steps outside back, 13 steps inside, handrails on one side on all stairs Has  following equipment at home: Single point cane  PLOF: Independent  PATIENT GOALS: Improve short term memory and be able to work in the yard efficiently  OBJECTIVE:  Note: Objective measures were completed at Evaluation unless otherwise noted.  DIAGNOSTIC FINDINGS: CT Cardiac Morph/Pulm Vein 06/02/23: 1. No acute extra cardiac abnormality. 2. Mild stable prominence of the main pulmonary arteries which may indicate degree of pulmonary arterial hypertension. 3. Aortic atherosclerosis.   COGNITION: Overall cognitive status: Within functional limits for tasks assessed   SENSATION: Not tested  COORDINATION: Not tested  EDEMA:  Not measured   MUSCLE TONE: Not tested  MUSCLE LENGTH: Not measured  DTRs:  Not evaluated  POSTURE: rounded shoulders  LOWER EXTREMITY ROM:     Active  Right Eval Left Eval  Hip flexion    Hip extension    Hip abduction    Hip adduction    Hip internal rotation    Hip external rotation    Knee flexion    Knee extension    Ankle dorsiflexion    Ankle plantarflexion    Ankle inversion    Ankle eversion     (Blank rows = not tested)  LOWER EXTREMITY MMT:    MMT Right Eval Left Eval  Hip flexion 4 4  Hip extension    Hip abduction    Hip adduction    Hip internal rotation    Hip external rotation    Knee flexion    Knee extension 5 5  Ankle dorsiflexion 4 4  Ankle plantarflexion    Ankle inversion    Ankle eversion    (Blank rows = not tested)  TRANSFERS: Assistive device utilized: None  Sit to stand: CGA Stand to sit: CGA Chair to chair: CGA  RAMP:  Ramp Comments: Not tested this visit  CURB:  Curb Comments: Not tested this vist  STAIRS:  Comments: Not observed this visit  GAIT: Gait pattern: WFL Assistive device utilized: None Level of assistance: CGA  FUNCTIONAL TESTS:  Berg Balance Scale: 43     PATIENT SURVEYS:  FOTO 76  TODAY'S TREATMENT:        TA: Stairs ascend/descend x 3 trials, cuing for  technique. With minimal use of handrails. Cuing for hand placement and stepping-technique (attempting both reciprocal and step to technique with descent)    TE: Nustep BUE/LE: interval level 1-5 x 5 min - continuous monitoring of patient exertion and no report of pain.    Sit to stand without UE support 2 sets x 10 reps.     NMR:  Tandem walking- FWD- down and back in // bars - no UE Support x 8.   Static stand (1 LE on 6" step and 1 LE on floor) with vertical head turns 2 sets of 10 reps each LE Step tap onto 6" block 2 x 10 reps without UE Support.  Step up with 1LE onto 6" block and up/over with opp LE x 12 reps each.  Resistive gait - matrix cable system- 12.5# - fwd x 5  and 17.5 # forward x 2.  SLS - multiple attempts - between 2- 11 sec at best     EDUCATION Education details: POC Person educated: Patient Education method: Explanation Education comprehension: verbalized understanding   HOME EXERCISE PROGRAM: Access Code: [facility] URL: [email or web address] Date: 07/05/2023 Prepared by: Grier Rocher  Exercises - Standing Single Leg Stance with Counter Support  - 1 x daily - 7 x weekly - 3 sets - 30 second hold - Mini Squat with Counter Support  - 1 x daily -  4 x weekly - 2 sets - 10 reps - Standing Hip Abduction with Counter Support  - 1 x daily - 4 x weekly - 2 sets - 10 reps - 2 hold - Standing Hip Extension with Counter Support  - 1 x daily - 4 x weekly - 2 sets - 10 reps - 2 hold - Seated Hip Abduction with Resistance  - 1 x daily - 4 x weekly - 3 sets - 10 reps - Seated Knee Lifts with Resistance  - 1 x daily - 4 x weekly - 3 sets - 10 reps - Seated Ankle Plantarflexion with Resistance  - 1 x daily - 4 x weekly - 3 sets - 10 reps - Seated Ankle Eversion with Resistance  - 1 x daily - 4 x weekly - 3 sets - 10 reps  Access Code: [facility] URL: [email or web address] Date: 08/06/2023 Prepared by: Precious Bard  Exercises - Standing  Single Leg Stance with Counter Support  - 1 x daily - 7 x weekly - 3 sets - 30 second hold - Mini Squat with Counter Support  - 1 x daily - 4 x weekly - 2 sets - 10 reps - Standing Hip Abduction with Counter Support  - 1 x daily - 4 x weekly - 2 sets - 10 reps - 2 hold - Standing Hip Extension with Counter Support  - 1 x daily - 4 x weekly - 2 sets - 10 reps - 2 hold - Seated Hip Abduction with Resistance  - 1 x daily - 4 x weekly - 3 sets - 10 reps - Seated Knee Lifts with Resistance  - 1 x daily - 4 x weekly - 3 sets - 10 reps - Seated Ankle Plantarflexion with Resistance  - 1 x daily - 4 x weekly - 3 sets - 10 reps - Seated Ankle Eversion with Resistance  - 1 x daily - 4 x weekly - 3 sets - 10 reps - Seated Toe Towel Scrunches  - 1 x daily - 7 x weekly - 2 sets - 10 reps - 5 hold - Seated Great Toe Extension  - 1 x daily - 7 x weekly - 2 sets - 10 reps - 5 hold  GOALS: Goals reviewed with patient? Yes  SHORT TERM GOALS: Target date: 07/12/2023     Patient will be independent in home exercise program to improve strength/mobility for better functional independence with ADLs. Baseline: Administered on 11/20  Goal status: INITIAL   LONG TERM GOALS: Target date: 08/30/2023    1.  Patient (> 42 years old) will improve five times sit to stand test by at least 3 seconds indicating an increased LE strength and improved balance. Baseline: 11/20: 14.19 Goal status: INITIAL  2.  Patient will increase FOTO score to equal to or greater than  80   to demonstrate statistically significant improvement in mobility and quality of life.  Baseline: 76 Goal status: INITIAL   3.  Patient will increase Berg Balance score by > 6 points to demonstrate decreased fall risk during functional activities. Baseline: 43 Goal status: INITIAL   4.  Patient will reduce timed up and go by at least 3 seconds to reduce fall risk and demonstrate improved transfer/gait ability. Baseline: 10 sec on 12/11 Goal status:  INITIAL  5.  Patient will increase 10 meter walk test by at least 0.13 m/s as to improve gait speed for better community ambulation and to reduce fall risk. Baseline: 12/11:23.33 sec  Goal status: INITIAL    ASSESSMENT:  CLINICAL IMPRESSION:  Patient presents with good motivation to progress her balance and strength. She performed well with all dynamic balance activities from initial unsteadiness and some difficulty to improving steadiness with Step taps, tandem and later SLS. She also demonstrated some progress with stairs- able to ascend/descend reciprocally on last trial with only CGA and no LOB.  Pt will continue to benefit from skilled physical therapy intervention to address impairments, improve QOL, and attain therapy goals.     OBJECTIVE IMPAIRMENTS: decreased balance and difficulty walking.   ACTIVITY LIMITATIONS: lifting, standing, and stairs  PARTICIPATION LIMITATIONS: yard work  PERSONAL FACTORS: 1 comorbidity: previous dx of breast cancer  are also affecting patient's functional outcome.   REHAB POTENTIAL: Good  CLINICAL DECISION MAKING: Stable/uncomplicated  EVALUATION COMPLEXITY: Low  PLAN:  PT FREQUENCY: 2x/week  PT DURATION: 12 weeks  PLANNED INTERVENTIONS: 97164- PT Re-evaluation, 97110-Therapeutic exercises, 97530- Therapeutic activity, 97112- Neuromuscular re-education, 97535- Self Care, 62130- Manual therapy, [id number]- Gait training, Balance training, Stair training, Cryotherapy, and Moist heat  PLAN FOR NEXT SESSION:   LE strengthening, balance training, gait training, stair training; PROGRESS NOTE NEXT VISIT   Lenda Kelp PT  Physical Therapist- [place]  [facility]

## 2023-08-13 ENCOUNTER — Ambulatory Visit: Payer: Medicare HMO | Admitting: Occupational Therapy

## 2023-08-13 ENCOUNTER — Ambulatory Visit: Payer: Medicare HMO | Admitting: Physical Therapy

## 2023-08-13 DIAGNOSIS — M6281 Muscle weakness (generalized): Secondary | ICD-10-CM | POA: Diagnosis not present

## 2023-08-13 DIAGNOSIS — R2689 Other abnormalities of gait and mobility: Secondary | ICD-10-CM | POA: Diagnosis not present

## 2023-08-13 DIAGNOSIS — R262 Difficulty in walking, not elsewhere classified: Secondary | ICD-10-CM | POA: Diagnosis not present

## 2023-08-13 DIAGNOSIS — R278 Other lack of coordination: Secondary | ICD-10-CM | POA: Diagnosis not present

## 2023-08-13 DIAGNOSIS — M25541 Pain in joints of right hand: Secondary | ICD-10-CM | POA: Diagnosis not present

## 2023-08-13 DIAGNOSIS — M25542 Pain in joints of left hand: Secondary | ICD-10-CM | POA: Diagnosis not present

## 2023-08-13 NOTE — Therapy (Signed)
[facility] [address] [place], [place], [address] Phone: [phone]   Fax:  [phone]  Occupational Therapy Treatment/discharge  Patient Details  Name: Krystal [name] MRN: [id number] Date of Birth: July 18, 1951 Referring Provider (OT): DR Epifania Gore Date: 08/13/2023   OT End of Session - 08/13/23 1251     Visit Number 10    Number of Visits 10    Date for OT Re-Evaluation 08/13/23    OT Start Time 1120    OT Stop Time 1200    OT Time Calculation (min) 40 min    Activity Tolerance Patient tolerated treatment well             Past Medical History:  Diagnosis Date   Anemia    Arthritis    osteo   Blindness of right eye at birth   Cancer [facility]) 2011   Left breast 2011 and cervical age 73   Carpal tunnel syndrome    Cataract    Depression    Edema 07/27/2011   Fibromyalgia    muscle weakness and pain   GERD (gastroesophageal reflux disease)    Hyperlipidemia    Hypertension    benign   Lymphedema    Osteoporosis    Plantar fasciitis    Pre-diabetes    Raynaud's disease    Restless leg syndrome    Rosacea    Rotator cuff rupture    Sleep apnea    Synovitis    Ulcer     Past Surgical History:  Procedure Laterality Date   ABDOMINAL HYSTERECTOMY  1986   For bleeding and pain   ATRIAL FIBRILLATION ABLATION N/A 05/14/2023   Procedure: ATRIAL FIBRILLATION ABLATION;  Surgeon: Lanier Prude, MD;  Location: [facility];  Service: Cardiovascular;  Laterality: N/A;   BLADDER SURGERY  2006   Bladder tack   BREAST RECONSTRUCTION Bilateral 09/17/2012   Procedure: BREAST RECONSTRUCTION;  Surgeon: Wayland Denis, DO;  Location: [facility] [place];  Service: Plastics;  Laterality: Bilateral;  BILATERAL BREAST RECONSTRUCTION WITH TISSUE EXPANDERS AND ALLOMED   BREAST RECONSTRUCTION Right 06/03/2013   Procedure: RIGHT BREAST CAPSULE CONSTRACTURE;  Surgeon: Wayland Denis, DO;  Location: [facility]  ;  Service: Plastics;  Laterality: Right;   CARDIOVERSION N/A 08/06/2022   Procedure: CARDIOVERSION;  Surgeon: Iran Ouch, MD;  Location: [facility];  Service: Cardiovascular;  Laterality: N/A;   CARDIOVERSION N/A 10/02/2022   Procedure: CARDIOVERSION;  Surgeon: Yvonne Kendall, MD;  Location: [facility];  Service: Cardiovascular;  Laterality: N/A;   CARPAL TUNNEL RELEASE Bilateral 2008   EYE SURGERY Right    INJECTION KNEE Left 08/12/2018   Procedure: KNEE INJECTION-LEFT;  Surgeon: Christena Flake, MD;  Location: [facility];  Service: Orthopedics;  Laterality: Left;   JOINT REPLACEMENT     LIPOSUCTION Bilateral 01/29/2013   Procedure: LIPOSUCTION;  Surgeon: Wayland Denis, DO;  Location: [facility];  Service: Plastics;  Laterality: Bilateral;   LIPOSUCTION Right 06/03/2013   Procedure: LIPOSUCTION;  Surgeon: Wayland Denis, DO;  Location: [facility];  Service: Plastics;  Laterality: Right;   MASTECTOMY  2012   rt prophalactic mast-snbx   MASTECTOMY MODIFIED RADICAL  2012   left-axillary nodes   NEUROMA SURGERY Bilateral    NOSE SURGERY  2007   PORT-A-CATH REMOVAL     insertion and   THROAT SURGERY     TONSILLECTOMY     TOTAL KNEE ARTHROPLASTY Right  08/12/2018   Procedure: TOTAL KNEE ARTHROPLASTY-RIGHT;  Surgeon: Christena Flake, MD;  Location: [facility];  Service: Orthopedics;  Laterality: Right;   TOTAL KNEE ARTHROPLASTY Left 09/22/2019   Procedure: TOTAL KNEE ARTHROPLASTY;  Surgeon: Christena Flake, MD;  Location: [facility];  Service: Orthopedics;  Laterality: Left;   UVULOPALATOPHARYNGOPLASTY      There were no vitals filed for this visit.   Subjective Assessment - 08/13/23 1250     Subjective  I have not been using my hands a lot this past month.  Husband is at the beach.  I am trying to take care of me this year.  Trying not to do as much for everybody else.  I did see Dr. Amanda Pea and going to have trigger finger surgery.  But fist I am having  3 February my breast implants removed.    Pertinent History 02/11/23 Dr Allena Katz note: Krystal [name] is a 73 y.o. female is here today for follow up of osteoarthritis. The patient's allergies, current medications, past family history, past medical history, past social history, past surgical history and problem list were reviewed and updated as appropriate.  She continues to have pain of the hand joints. She is taking Lyrica, Cymbalta and Plaquenil. She is not able to do things with her hands anymore. She has noticed some swelling of the hand joints. She has been offered [facility] surgery but she declined.Assessment and Plan  Osteoarthritis of multiple joints: active -- Unable to take NSAIDs due to being on blood thinners -- She states that Tramadol did not help. -- She states that Tylenol made her liver enzymes increase. -- Failed Plaquenil -- Continue Cymbalta. -- Trial of Methotrexate 10 mg weekly and Folic Acid 1 mg Daily -- Refer to OT  2. Fibromyalgia -- She is not taking gabapentin. -- Continue Lyrica 300 mg/day -- She continues to stay active.  3. Long term use of high risk medication -- Methotrexate is    Patient Stated Goals I don't want surgery - and want my pain in thumbs and fingers better so I can do things around the house, yard work, crafts, crochet, ,knit- and exercise classes    Currently in Pain? Yes    Pain Score 1     Pain Location Hand    Pain Orientation Right;Left                [facility] OT Assessment - 08/13/23 0001       AROM   Right Wrist Extension 55 Degrees    Right Wrist Flexion 85 Degrees    Right Wrist Radial Deviation 20 Degrees    Right Wrist Ulnar Deviation 30 Degrees    Left Wrist Extension 70 Degrees    Left Wrist Flexion 80 Degrees    Left Wrist Radial Deviation 15 Degrees    Left Wrist Ulnar Deviation 20 Degrees      Strength   Right Hand Grip (lbs) 45    Right Hand Lateral Pinch 8 lbs    Right Hand 3 Point Pinch 8 lbs    Left Hand Grip (lbs) 50    Left  Hand Lateral Pinch 12 lbs    Left Hand 3 Point Pinch 9 lbs            Patient arrived after not being seen for about a month. Patient has been out of town. Patient has been doing really well not doing as much for other people. Taking care of her hands the way she  picks up things hold things carry things push and pull. Pain in bilateral hands 1/10 She did see the hand surgeon plan is to do in the near future trigger finger releases on both hands. But she first has on 3 February removal of bilateral breast implants  With patient's pain decreased active range of motion in digits within functional limits Great progress in grip of range and compared to previous visits Reinforced with patient again decreased pain increase motion and strength Patient do report difficulty carrying a drink and placing it on a table because of her wrist stiffness. Active range of motion decreased on right wrist extension and left radial ulnar deviation After moist heat reviewed with patient range of motion stretches for bilateral wrist extension as well as radial ulnar deviation. Patient showed improvement after stretches. Patient can continue with home program and can contact me if therapy indicated after hand surgery.                   OT Education - 08/13/23 1251     Education Details Progress and HEP    Person(s) Educated Patient    Methods Explanation;Demonstration;Tactile cues;Verbal cues;Handout    Comprehension Verbal cues required;Returned demonstration;Verbalized understanding                 OT Long Term Goals - 08/13/23 1257       OT LONG TERM GOAL #1   Title Pt to be independent in HEP to decrease pain to less than 5/10 while maintain AROM in bilateral hands    Status Achieved      OT LONG TERM GOAL #2   Title Pt to verbalize 3 joint protection and AE to decrease pain and increase ease of use of hands with less pain    Status Achieved      OT LONG TERM GOAL #3    Title Pt show AROM WFL in bilateral hands with digits -  pain less than 4/10 during ADL's    Status Achieved      OT LONG TERM GOAL #4   Title Pt to impliment lifestyle changes to increase time for self to do exercise and family to be more independent    Status Partially Met                   Plan - 08/13/23 1253     Clinical Impression Statement Pt refer In July by Dr Allena Katz with pain in bilateral hands - OA and changes to bilateral hands , all digits and joints -pt was seen for 7 visits -and pain at rest decrease to 2/10 in bilateral hands - she was doing really well with modifications of tasks and AE use - Also doing modalities to decrease stiffness and pain - Pt was out of town at [employer] and then her Dad passed away and was not seen from Sept to middle Nov-and then did not see pt for about 3 wks - had funeral and pt return to therapy again with increase pain , edema, stiffness and decrease strength in bilateral hands compare to in the past pain 6/10 and increase tenderness again in R 4th and L  3rd and 4th digits on bilatral hands triggering and increase tenderness- Her grip and prehension strenght decrease greatly compare to Sept - and pt appear to have flare up in her OA - with joint changes at DIP's and PIP's - increase edema and pain,  Pt has done some changes in her activities, priorities,  saying No- She was out of town during the holidays and will return to therapy upon her return.  She is seen this past month the hand surgeon for possible trigger finger release in the future.  Patient has not been using her hands as much.  Patient arrived with a 1/10 pain in bilateral hands with great progress in grip and prehension.  Reinforced with patient again about taking care of her hands.  Not overdoing and using it.  Patient can continue with home program and follow-up with me if needed after trigger finger release surgery.    OT Occupational Profile and History Problem Focused Assessment -  Including review of records relating to presenting problem    Occupational performance deficits (Please refer to evaluation for details): ADL's;IADL's;Leisure;Social Participation;Play;Rest and Sleep    Body Structure / Function / Physical Skills ADL;Coordination;IADL;Pain;Strength;Edema;FMC;Decreased knowledge of use of DME;Flexibility;ROM;UE functional use    Rehab Potential Fair    Clinical Decision Making Several treatment options, min-mod task modification necessary    Modification or Assistance to Complete Evaluation  No modification of tasks or assist necessary to complete eval    OT Frequency 1x / week    OT Duration --   1 wk   OT Treatment/Interventions Self-care/ADL training;Therapeutic exercise;Patient/family education;Splinting;Fluidtherapy;Contrast Bath;Ultrasound;DME and/or AE instruction;Manual Therapy;Passive range of motion;Paraffin;Iontophoresis    Consulted and Agree with Plan of Care Patient             Patient will benefit from skilled therapeutic intervention in order to improve the following deficits and impairments:   Body Structure / Function / Physical Skills: ADL, Coordination, IADL, Pain, Strength, Edema, FMC, Decreased knowledge of use of DME, Flexibility, ROM, UE functional use       Visit Diagnosis: Joint pain in both hands    Problem List Patient Active Problem List   Diagnosis Date Noted   Raynaud's disease 11/05/2022   Osteoporosis, post-menopausal 11/05/2022   Atrial flutter (HCC) 10/02/2022   First degree AV block 09/11/2022   Arthritis 08/08/2022   Persistent atrial fibrillation (HCC) 08/06/2022   Depression 02/27/2022   Gastroesophageal reflux disease 02/27/2022   Hypertension 02/27/2022   Abnormal LFTs 02/27/2022   Alcohol abuse 02/27/2022   Atrial fibrillation with RVR (HCC) 02/27/2022   Morbid obesity (HCC) 02/27/2022   Pes anserinus bursitis of left knee 02/08/2020   Status post total knee replacement using cement, left  09/22/2019   Status post total knee replacement using cement, right 08/12/2018   Rotator cuff rupture    Status post bilateral breast reconstruction 02/06/2013   Acquired absence of bilateral breasts and nipples 09/17/2012   Breast cancer of lower-outer quadrant of left female breast (HCC) 01/16/2011    Oletta Cohn, OT 08/13/2023, 12:58 PM  [facility] [address], [place], [address] Phone: [phone]   Fax:  [phone]  Name: Krystal [name] MRN: [id number] Date of Birth: 1950-10-29

## 2023-08-15 ENCOUNTER — Ambulatory Visit: Payer: Medicare HMO

## 2023-08-15 DIAGNOSIS — R262 Difficulty in walking, not elsewhere classified: Secondary | ICD-10-CM

## 2023-08-15 DIAGNOSIS — M6281 Muscle weakness (generalized): Secondary | ICD-10-CM

## 2023-08-15 DIAGNOSIS — R278 Other lack of coordination: Secondary | ICD-10-CM | POA: Diagnosis not present

## 2023-08-15 DIAGNOSIS — R2689 Other abnormalities of gait and mobility: Secondary | ICD-10-CM

## 2023-08-15 DIAGNOSIS — M25542 Pain in joints of left hand: Secondary | ICD-10-CM | POA: Diagnosis not present

## 2023-08-15 DIAGNOSIS — M25541 Pain in joints of right hand: Secondary | ICD-10-CM | POA: Diagnosis not present

## 2023-08-15 NOTE — Therapy (Deleted)
OUTPATIENT PHYSICAL THERAPY NEURO TREATMENT/ Physical Therapy Progress Note   Dates of reporting period  ***   to   ***    Patient Name: Krystal [name] MRN: [id number] DOB:07/16/1951, 73 y.o., female Today's Date: 08/15/2023   PCP: Jerl Mina, MD REFERRING PROVIDER: Loa Socks, NP  END OF SESSION:         Past Medical History:  Diagnosis Date   Anemia    Arthritis    osteo   Blindness of right eye at birth   Cancer [facility]) 2011   Left breast 2011 and cervical age 32   Carpal tunnel syndrome    Cataract    Depression    Edema 07/27/2011   Fibromyalgia    muscle weakness and pain   GERD (gastroesophageal reflux disease)    Hyperlipidemia    Hypertension    benign   Lymphedema    Osteoporosis    Plantar fasciitis    Pre-diabetes    Raynaud's disease    Restless leg syndrome    Rosacea    Rotator cuff rupture    Sleep apnea    Synovitis    Ulcer    Past Surgical History:  Procedure Laterality Date   ABDOMINAL HYSTERECTOMY  1986   For bleeding and pain   ATRIAL FIBRILLATION ABLATION N/A 05/14/2023   Procedure: ATRIAL FIBRILLATION ABLATION;  Surgeon: Lanier Prude, MD;  Location: [facility];  Service: Cardiovascular;  Laterality: N/A;   BLADDER SURGERY  2006   Bladder tack   BREAST RECONSTRUCTION Bilateral 09/17/2012   Procedure: BREAST RECONSTRUCTION;  Surgeon: Wayland Denis, DO;  Location: [facility];  Service: Plastics;  Laterality: Bilateral;  BILATERAL BREAST RECONSTRUCTION WITH TISSUE EXPANDERS AND ALLOMED   BREAST RECONSTRUCTION Right 06/03/2013   Procedure: RIGHT BREAST CAPSULE CONSTRACTURE;  Surgeon: Wayland Denis, DO;  Location: [facility];  Service: Plastics;  Laterality: Right;   CARDIOVERSION N/A 08/06/2022   Procedure: CARDIOVERSION;  Surgeon: Iran Ouch, MD;  Location: [facility];  Service: Cardiovascular;  Laterality: N/A;   CARDIOVERSION N/A 10/02/2022   Procedure: CARDIOVERSION;   Surgeon: Yvonne Kendall, MD;  Location: [facility];  Service: Cardiovascular;  Laterality: N/A;   CARPAL TUNNEL RELEASE Bilateral 2008   EYE SURGERY Right    INJECTION KNEE Left 08/12/2018   Procedure: KNEE INJECTION-LEFT;  Surgeon: Christena Flake, MD;  Location: [facility];  Service: Orthopedics;  Laterality: Left;   JOINT REPLACEMENT     LIPOSUCTION Bilateral 01/29/2013   Procedure: LIPOSUCTION;  Surgeon: Wayland Denis, DO;  Location: [facility];  Service: Plastics;  Laterality: Bilateral;   LIPOSUCTION Right 06/03/2013   Procedure: LIPOSUCTION;  Surgeon: Wayland Denis, DO;  Location:  SURGERY CENTER;  Service: Plastics;  Laterality: Right;   MASTECTOMY  2012   rt prophalactic mast-snbx   MASTECTOMY MODIFIED RADICAL  2012   left-axillary nodes   NEUROMA SURGERY Bilateral    NOSE SURGERY  2007   PORT-A-CATH REMOVAL     insertion and   THROAT SURGERY     TONSILLECTOMY     TOTAL KNEE ARTHROPLASTY Right 08/12/2018   Procedure: TOTAL KNEE ARTHROPLASTY-RIGHT;  Surgeon: Christena Flake, MD;  Location: [facility];  Service: Orthopedics;  Laterality: Right;   TOTAL KNEE ARTHROPLASTY Left 09/22/2019   Procedure: TOTAL KNEE ARTHROPLASTY;  Surgeon: Christena Flake, MD;  Location: [facility];  Service: Orthopedics;  Laterality: Left;   UVULOPALATOPHARYNGOPLASTY     Patient Active Problem List  Diagnosis Date Noted   Raynaud's disease 11/05/2022   Osteoporosis, post-menopausal 11/05/2022   Atrial flutter (HCC) 10/02/2022   First degree AV block 09/11/2022   Arthritis 08/08/2022   Persistent atrial fibrillation (HCC) 08/06/2022   Depression 02/27/2022   Gastroesophageal reflux disease 02/27/2022   Hypertension 02/27/2022   Abnormal LFTs 02/27/2022   Alcohol abuse 02/27/2022   Atrial fibrillation with RVR (HCC) 02/27/2022   Morbid obesity (HCC) 02/27/2022   Pes anserinus bursitis of left knee 02/08/2020   Status post total knee replacement using cement, left 09/22/2019    Status post total knee replacement using cement, right 08/12/2018   Rotator cuff rupture    Status post bilateral breast reconstruction 02/06/2013   Acquired absence of bilateral breasts and nipples 09/17/2012   Breast cancer of lower-outer quadrant of left female breast (HCC) 01/16/2011    ONSET DATE: 07/23/02  REFERRING DIAG:  H08.6,V78.1X5A (ICD-10-CM) - Chemotherapy-induced peripheral neuropathy (HCC)   THERAPY DIAG:  Muscle weakness (generalized)  Difficulty in walking, not elsewhere classified  Other abnormalities of gait and mobility  Rationale for Evaluation and Treatment: Rehabilitation  SUBJECTIVE:                                                                                                                                                                                             SUBJECTIVE STATEMENT: Patient reports she has been having to do a lot of steps over the weekend and reports hip are sore. Denies any recent falls. Reports fearful of falling down stairs.   Pt accompanied by: self  PERTINENT HISTORY: Hypertension, Atrial Fibrillation, First degree AV block, Atrial flutter, Raynaud's disease, GERD, Arthritis, Obesity, Breast cancer, Depression, alcohol abuse  PAIN:  Are you having pain? No  PRECAUTIONS: None  RED FLAGS: None   WEIGHT BEARING RESTRICTIONS: No  FALLS: Has patient fallen in last 6 months? No  LIVING ENVIRONMENT: Lives with: lives with their spouse Lives in: House/apartment Stairs:  5 steps outside front, 3 steps outside back, 13 steps inside, handrails on one side on all stairs Has following equipment at home: Single point cane  PLOF: Independent  PATIENT GOALS: Improve short term memory and be able to work in the yard efficiently  OBJECTIVE:  Note: Objective measures were completed at Evaluation unless otherwise noted.  DIAGNOSTIC FINDINGS: CT Cardiac Morph/Pulm Vein 06/02/23: 1. No acute extra cardiac abnormality. 2. Mild  stable prominence of the main pulmonary arteries which may indicate degree of pulmonary arterial hypertension. 3. Aortic atherosclerosis.   COGNITION: Overall cognitive status: Within functional limits for tasks assessed   SENSATION: Not tested  COORDINATION: Not tested  EDEMA:  Not measured   MUSCLE TONE: Not tested  MUSCLE LENGTH: Not measured  DTRs:  Not evaluated  POSTURE: rounded shoulders  LOWER EXTREMITY ROM:     Active  Right Eval Left Eval  Hip flexion    Hip extension    Hip abduction    Hip adduction    Hip internal rotation    Hip external rotation    Knee flexion    Knee extension    Ankle dorsiflexion    Ankle plantarflexion    Ankle inversion    Ankle eversion     (Blank rows = not tested)  LOWER EXTREMITY MMT:    MMT Right Eval Left Eval  Hip flexion 4 4  Hip extension    Hip abduction    Hip adduction    Hip internal rotation    Hip external rotation    Knee flexion    Knee extension 5 5  Ankle dorsiflexion 4 4  Ankle plantarflexion    Ankle inversion    Ankle eversion    (Blank rows = not tested)  TRANSFERS: Assistive device utilized: None  Sit to stand: CGA Stand to sit: CGA Chair to chair: CGA  RAMP:  Ramp Comments: Not tested this visit  CURB:  Curb Comments: Not tested this vist  STAIRS:  Comments: Not observed this visit  GAIT: Gait pattern: WFL Assistive device utilized: None Level of assistance: CGA  FUNCTIONAL TESTS:  Berg Balance Scale: 43     PATIENT SURVEYS:  FOTO 76  TODAY'S TREATMENT:        Physical therapy treatment session today consisted of completing assessment of goals and administration of testing as demonstrated and documented in flow sheet, treatment, and goals section of this note. Addition treatments may be found below.    TA: Stairs ascend/descend x 3 trials, cuing for technique. With minimal use of handrails. Cuing for hand placement and stepping-technique (attempting both  reciprocal and step to technique with descent)    TE: Nustep BUE/LE: interval level 1-5 x 5 min - continuous monitoring of patient exertion and no report of pain.    Sit to stand without UE support 2 sets x 10 reps.     NMR:  Tandem walking- FWD- down and back in // bars - no UE Support x 8.   Static stand (1 LE on 6" step and 1 LE on floor) with vertical head turns 2 sets of 10 reps each LE Step tap onto 6" block 2 x 10 reps without UE Support.  Step up with 1LE onto 6" block and up/over with opp LE x 12 reps each.  Resistive gait - matrix cable system- 12.5# - fwd x 5  and 17.5 # forward x 2.  SLS - multiple attempts - between 2- 11 sec at best     EDUCATION Education details: POC Person educated: Patient Education method: Explanation Education comprehension: verbalized understanding   HOME EXERCISE PROGRAM: Access Code: [facility] URL: [email or web address] Date: 07/05/2023 Prepared by: Grier Rocher  Exercises - Standing Single Leg Stance with Counter Support  - 1 x daily - 7 x weekly - 3 sets - 30 second hold - Mini Squat with Counter Support  - 1 x daily - 4 x weekly - 2 sets - 10 reps - Standing Hip Abduction with Counter Support  - 1 x daily - 4 x weekly - 2 sets - 10 reps - 2 hold - Standing Hip Extension with Counter Support  - 1 x  daily - 4 x weekly - 2 sets - 10 reps - 2 hold - Seated Hip Abduction with Resistance  - 1 x daily - 4 x weekly - 3 sets - 10 reps - Seated Knee Lifts with Resistance  - 1 x daily - 4 x weekly - 3 sets - 10 reps - Seated Ankle Plantarflexion with Resistance  - 1 x daily - 4 x weekly - 3 sets - 10 reps - Seated Ankle Eversion with Resistance  - 1 x daily - 4 x weekly - 3 sets - 10 reps  Access Code: [facility] URL: [email or web address] Date: 08/06/2023 Prepared by: Precious Bard  Exercises - Standing Single Leg Stance with Counter Support  - 1 x daily - 7 x weekly - 3 sets - 30 second hold - Mini Squat  with Counter Support  - 1 x daily - 4 x weekly - 2 sets - 10 reps - Standing Hip Abduction with Counter Support  - 1 x daily - 4 x weekly - 2 sets - 10 reps - 2 hold - Standing Hip Extension with Counter Support  - 1 x daily - 4 x weekly - 2 sets - 10 reps - 2 hold - Seated Hip Abduction with Resistance  - 1 x daily - 4 x weekly - 3 sets - 10 reps - Seated Knee Lifts with Resistance  - 1 x daily - 4 x weekly - 3 sets - 10 reps - Seated Ankle Plantarflexion with Resistance  - 1 x daily - 4 x weekly - 3 sets - 10 reps - Seated Ankle Eversion with Resistance  - 1 x daily - 4 x weekly - 3 sets - 10 reps - Seated Toe Towel Scrunches  - 1 x daily - 7 x weekly - 2 sets - 10 reps - 5 hold - Seated Great Toe Extension  - 1 x daily - 7 x weekly - 2 sets - 10 reps - 5 hold  GOALS: Goals reviewed with patient? Yes  SHORT TERM GOALS: Target date: 07/12/2023     Patient will be independent in home exercise program to improve strength/mobility for better functional independence with ADLs. Baseline: Administered on 11/20  Goal status: INITIAL   LONG TERM GOALS: Target date: 08/30/2023    1.  Patient (> 46 years old) will improve five times sit to stand test by at least 3 seconds indicating an increased LE strength and improved balance. Baseline: 11/20: 14.19 Goal status: INITIAL  2.  Patient will increase FOTO score to equal to or greater than  80   to demonstrate statistically significant improvement in mobility and quality of life.  Baseline: 76 Goal status: INITIAL   3.  Patient will increase Berg Balance score by > 6 points to demonstrate decreased fall risk during functional activities. Baseline: 43 Goal status: INITIAL   4.  Patient will reduce timed up and go by at least 3 seconds to reduce fall risk and demonstrate improved transfer/gait ability. Baseline: 10 sec on 12/11 Goal status: INITIAL  5.  Patient will increase 10 meter walk test by at least 0.13 m/s as to improve gait speed for  better community ambulation and to reduce fall risk. Baseline: 12/11:23.33 sec  Goal status: INITIAL    ASSESSMENT:  CLINICAL IMPRESSION:  Patient presents with good motivation to progress her balance and strength. She performed well with all dynamic balance activities from initial unsteadiness and some difficulty to improving steadiness with Step taps, tandem and later  SLS. She also demonstrated some progress with stairs- able to ascend/descend reciprocally on last trial with only CGA and no LOB.  Pt will continue to benefit from skilled physical therapy intervention to address impairments, improve QOL, and attain therapy goals.     OBJECTIVE IMPAIRMENTS: decreased balance and difficulty walking.   ACTIVITY LIMITATIONS: lifting, standing, and stairs  PARTICIPATION LIMITATIONS: yard work  PERSONAL FACTORS: 1 comorbidity: previous dx of breast cancer  are also affecting patient's functional outcome.   REHAB POTENTIAL: Good  CLINICAL DECISION MAKING: Stable/uncomplicated  EVALUATION COMPLEXITY: Low  PLAN:  PT FREQUENCY: 2x/week  PT DURATION: 12 weeks  PLANNED INTERVENTIONS: 97164- PT Re-evaluation, 97110-Therapeutic exercises, 97530- Therapeutic activity, 97112- Neuromuscular re-education, 97535- Self Care, [id number]- Manual therapy, [phone]- Gait training, Balance training, Stair training, Cryotherapy, and Moist heat  PLAN FOR NEXT SESSION:   LE strengthening, balance training, gait training, stair training; PROGRESS NOTE NEXT VISIT   Norman Herrlich PT  Physical Therapist- [place]  [facility]

## 2023-08-15 NOTE — Therapy (Signed)
OUTPATIENT PHYSICAL THERAPY NEURO TREATMENT/ DISCHARGE VISIT/Physical Therapy Progress Note   Dates of reporting period  06/06/2024   to   08/15/2023    Patient Name: Krystal [name] MRN: [id number] DOB:05/21/1951, 73 y.o., female Today's Date: 08/16/2023   PCP: Jerl Mina, MD REFERRING PROVIDER: Loa Socks, NP  END OF SESSION:  PT End of Session - 08/15/23 0932     Visit Number 10    Number of Visits 24    Date for PT Re-Evaluation 08/30/23    Progress Note Due on Visit 20    PT Start Time 0930    PT Stop Time 1013    PT Time Calculation (min) 43 min    Equipment Utilized During Treatment Gait belt    Activity Tolerance Patient tolerated treatment well    Behavior During Therapy WFL for tasks assessed/performed                   Past Medical History:  Diagnosis Date   Anemia    Arthritis    osteo   Blindness of right eye at birth   Cancer [facility]) 2011   Left breast 2011 and cervical age 61   Carpal tunnel syndrome    Cataract    Depression    Edema 07/27/2011   Fibromyalgia    muscle weakness and pain   GERD (gastroesophageal reflux disease)    Hyperlipidemia    Hypertension    benign   Lymphedema    Osteoporosis    Plantar fasciitis    Pre-diabetes    Raynaud's disease    Restless leg syndrome    Rosacea    Rotator cuff rupture    Sleep apnea    Synovitis    Ulcer    Past Surgical History:  Procedure Laterality Date   ABDOMINAL HYSTERECTOMY  1986   For bleeding and pain   ATRIAL FIBRILLATION ABLATION N/A 05/14/2023   Procedure: ATRIAL FIBRILLATION ABLATION;  Surgeon: Lanier Prude, MD;  Location: [facility];  Service: Cardiovascular;  Laterality: N/A;   BLADDER SURGERY  2006   Bladder tack   BREAST RECONSTRUCTION Bilateral 09/17/2012   Procedure: BREAST RECONSTRUCTION;  Surgeon: Wayland Denis, DO;  Location: [facility];  Service: Plastics;  Laterality: Bilateral;  BILATERAL BREAST RECONSTRUCTION  WITH TISSUE EXPANDERS AND ALLOMED   BREAST RECONSTRUCTION Right 06/03/2013   Procedure: RIGHT BREAST CAPSULE CONSTRACTURE;  Surgeon: Wayland Denis, DO;  Location: [facility];  Service: Plastics;  Laterality: Right;   CARDIOVERSION N/A 08/06/2022   Procedure: CARDIOVERSION;  Surgeon: Iran Ouch, MD;  Location: [facility];  Service: Cardiovascular;  Laterality: N/A;   CARDIOVERSION N/A 10/02/2022   Procedure: CARDIOVERSION;  Surgeon: Yvonne Kendall, MD;  Location: [facility];  Service: Cardiovascular;  Laterality: N/A;   CARPAL TUNNEL RELEASE Bilateral 2008   EYE SURGERY Right    INJECTION KNEE Left 08/12/2018   Procedure: KNEE INJECTION-LEFT;  Surgeon: Christena Flake, MD;  Location: [facility];  Service: Orthopedics;  Laterality: Left;   JOINT REPLACEMENT     LIPOSUCTION Bilateral 01/29/2013   Procedure: LIPOSUCTION;  Surgeon: Wayland Denis, DO;  Location: [facility];  Service: Plastics;  Laterality: Bilateral;   LIPOSUCTION Right 06/03/2013   Procedure: LIPOSUCTION;  Surgeon: Wayland Denis, DO;  Location: [facility];  Service: Plastics;  Laterality: Right;   MASTECTOMY  2012   rt prophalactic mast-snbx   MASTECTOMY MODIFIED RADICAL  2012   left-axillary nodes  NEUROMA SURGERY Bilateral    NOSE SURGERY  2007   PORT-A-CATH REMOVAL     insertion and   THROAT SURGERY     TONSILLECTOMY     TOTAL KNEE ARTHROPLASTY Right 08/12/2018   Procedure: TOTAL KNEE ARTHROPLASTY-RIGHT;  Surgeon: Christena Flake, MD;  Location: [facility];  Service: Orthopedics;  Laterality: Right;   TOTAL KNEE ARTHROPLASTY Left 09/22/2019   Procedure: TOTAL KNEE ARTHROPLASTY;  Surgeon: Christena Flake, MD;  Location: [facility];  Service: Orthopedics;  Laterality: Left;   UVULOPALATOPHARYNGOPLASTY     Patient Active Problem List   Diagnosis Date Noted   Raynaud's disease 11/05/2022   Osteoporosis, post-menopausal 11/05/2022   Atrial flutter (HCC) 10/02/2022   First degree AV block  09/11/2022   Arthritis 08/08/2022   Persistent atrial fibrillation (HCC) 08/06/2022   Depression 02/27/2022   Gastroesophageal reflux disease 02/27/2022   Hypertension 02/27/2022   Abnormal LFTs 02/27/2022   Alcohol abuse 02/27/2022   Atrial fibrillation with RVR (HCC) 02/27/2022   Morbid obesity (HCC) 02/27/2022   Pes anserinus bursitis of left knee 02/08/2020   Status post total knee replacement using cement, left 09/22/2019   Status post total knee replacement using cement, right 08/12/2018   Rotator cuff rupture    Status post bilateral breast reconstruction 02/06/2013   Acquired absence of bilateral breasts and nipples 09/17/2012   Breast cancer of lower-outer quadrant of left female breast (HCC) 01/16/2011    ONSET DATE: 07/23/02  REFERRING DIAG:  O13.0,Q65.1X5A (ICD-10-CM) - Chemotherapy-induced peripheral neuropathy (HCC)   THERAPY DIAG:  Muscle weakness (generalized)  Difficulty in walking, not elsewhere classified  Other abnormalities of gait and mobility  Other lack of coordination  Rationale for Evaluation and Treatment: Rehabilitation  SUBJECTIVE:                                                                                                                                                                                             SUBJECTIVE STATEMENT: Patient reports sore today and didn't want to come - not Sore due to PT but just doing a lot around the house and running up/down steps  Pt accompanied by: self  PERTINENT HISTORY: Hypertension, Atrial Fibrillation, First degree AV block, Atrial flutter, Raynaud's disease, GERD, Arthritis, Obesity, Breast cancer, Depression, alcohol abuse  PAIN:  Are you having pain? No  PRECAUTIONS: None  RED FLAGS: None   WEIGHT BEARING RESTRICTIONS: No  FALLS: Has patient fallen in last 6 months? No  LIVING ENVIRONMENT: Lives with: lives with their spouse Lives in: House/apartment Stairs:  5 steps outside front,  3 steps outside back, 13 steps inside,  handrails on one side on all stairs Has following equipment at home: Single point cane  PLOF: Independent  PATIENT GOALS: Improve short term memory and be able to work in the yard efficiently  OBJECTIVE:  Note: Objective measures were completed at Evaluation unless otherwise noted.  DIAGNOSTIC FINDINGS: CT Cardiac Morph/Pulm Vein 06/02/23: 1. No acute extra cardiac abnormality. 2. Mild stable prominence of the main pulmonary arteries which may indicate degree of pulmonary arterial hypertension. 3. Aortic atherosclerosis.   COGNITION: Overall cognitive status: Within functional limits for tasks assessed   SENSATION: Not tested  COORDINATION: Not tested  EDEMA:  Not measured   MUSCLE TONE: Not tested  MUSCLE LENGTH: Not measured  DTRs:  Not evaluated  POSTURE: rounded shoulders  LOWER EXTREMITY ROM:     Active  Right Eval Left Eval  Hip flexion    Hip extension    Hip abduction    Hip adduction    Hip internal rotation    Hip external rotation    Knee flexion    Knee extension    Ankle dorsiflexion    Ankle plantarflexion    Ankle inversion    Ankle eversion     (Blank rows = not tested)  LOWER EXTREMITY MMT:    MMT Right Eval Left Eval  Hip flexion 4 4  Hip extension    Hip abduction    Hip adduction    Hip internal rotation    Hip external rotation    Knee flexion    Knee extension 5 5  Ankle dorsiflexion 4 4  Ankle plantarflexion    Ankle inversion    Ankle eversion    (Blank rows = not tested)  TRANSFERS: Assistive device utilized: None  Sit to stand: CGA Stand to sit: CGA Chair to chair: CGA  RAMP:  Ramp Comments: Not tested this visit  CURB:  Curb Comments: Not tested this vist  STAIRS:  Comments: Not observed this visit  GAIT: Gait pattern: WFL Assistive device utilized: None Level of assistance: CGA  FUNCTIONAL TESTS:  Berg Balance Scale: 43     PATIENT SURVEYS:  FOTO  76  TODAY'S TREATMENT:         Physical therapy treatment session today consisted of completing assessment of goals and administration of testing as demonstrated and documented in flow sheet, treatment, and goals section of this note. Addition treatments may be found below.   Review of HEP- See below Exercises listed in HEP= Patient verbalized and demonstrated independence with all the above.      EDUCATION Education details: POC Person educated: Patient Education method: Explanation Education comprehension: verbalized understanding   HOME EXERCISE PROGRAM: Access Code: [facility] URL: [email or web address] Date: 07/05/2023 Prepared by: Grier Rocher  Exercises - Standing Single Leg Stance with Counter Support  - 1 x daily - 7 x weekly - 3 sets - 30 second hold - Mini Squat with Counter Support  - 1 x daily - 4 x weekly - 2 sets - 10 reps - Standing Hip Abduction with Counter Support  - 1 x daily - 4 x weekly - 2 sets - 10 reps - 2 hold - Standing Hip Extension with Counter Support  - 1 x daily - 4 x weekly - 2 sets - 10 reps - 2 hold - Seated Hip Abduction with Resistance  - 1 x daily - 4 x weekly - 3 sets - 10 reps - Seated Knee Lifts with Resistance  - 1 x daily - 4 x weekly -  3 sets - 10 reps - Seated Ankle Plantarflexion with Resistance  - 1 x daily - 4 x weekly - 3 sets - 10 reps - Seated Ankle Eversion with Resistance  - 1 x daily - 4 x weekly - 3 sets - 10 reps  Access Code: [facility] URL: [email or web address] Date: 08/06/2023 Prepared by: Precious Bard  Exercises - Standing Single Leg Stance with Counter Support  - 1 x daily - 7 x weekly - 3 sets - 30 second hold - Mini Squat with Counter Support  - 1 x daily - 4 x weekly - 2 sets - 10 reps - Standing Hip Abduction with Counter Support  - 1 x daily - 4 x weekly - 2 sets - 10 reps - 2 hold - Standing Hip Extension with Counter Support  - 1 x daily - 4 x weekly - 2 sets - 10 reps - 2 hold -  Seated Hip Abduction with Resistance  - 1 x daily - 4 x weekly - 3 sets - 10 reps - Seated Knee Lifts with Resistance  - 1 x daily - 4 x weekly - 3 sets - 10 reps - Seated Ankle Plantarflexion with Resistance  - 1 x daily - 4 x weekly - 3 sets - 10 reps - Seated Ankle Eversion with Resistance  - 1 x daily - 4 x weekly - 3 sets - 10 reps - Seated Toe Towel Scrunches  - 1 x daily - 7 x weekly - 2 sets - 10 reps - 5 hold - Seated Great Toe Extension  - 1 x daily - 7 x weekly - 2 sets - 10 reps - 5 hold  GOALS: Goals reviewed with patient? Yes  SHORT TERM GOALS: Target date: 07/12/2023     Patient will be independent in home exercise program to improve strength/mobility for better functional independence with ADLs. Baseline: Administered on 11/20; 08/15/2023= Patient reports independent with current HEP and no questions at this time. Goal status: MET  LONG TERM GOALS: Target date: 08/30/2023    1.  Patient (> 46 years old) will improve five times sit to stand test by at least 3 seconds indicating an increased LE strength and improved balance. Baseline: 11/20: 14.19; 08/15/2023= 8.57 sec  Goal status: MET  2.  Patient will increase FOTO score to equal to or greater than  80   to demonstrate statistically significant improvement in mobility and quality of life.  Baseline: 76; 08/15/2023= 82 Goal status: MET   3.  Patient will increase Berg Balance score by > 6 points to demonstrate decreased fall risk during functional activities. Baseline: 43/56; 08/15/2023= 51/56 Goal status: MET   4.  Patient will reduce timed up and go by at least 3 seconds to reduce fall risk and demonstrate improved transfer/gait ability. Baseline: 10 sec on 12/11; 08/15/2023= 8.02 sec avg.  Goal status: PROGRESSING   5.  Patient will increase 10 meter walk test by at least 0.13 m/s as to improve gait speed for better community ambulation and to reduce fall risk. Baseline: 12/11:23.33 sec; 08/15/2023= 7.3 sec  Goal  status: MET    ASSESSMENT:  CLINICAL IMPRESSION:  Patient presents with good motivation for progress note visit. She participated in several test and demonstrated great progress with balance, strength and all mobility. She actually met her goals demonstrating significant improvement overall in all goals. She was comfortable with discharge from outpaitent pT with all goals met and agreeable to continue her HEP on her own  stating she has made great progress and doing really well.    OBJECTIVE IMPAIRMENTS: decreased balance and difficulty walking.   ACTIVITY LIMITATIONS: lifting, standing, and stairs  PARTICIPATION LIMITATIONS: yard work  PERSONAL FACTORS: 1 comorbidity: previous dx of breast cancer  are also affecting patient's functional outcome.   REHAB POTENTIAL: Good  CLINICAL DECISION MAKING: Stable/uncomplicated  EVALUATION COMPLEXITY: Low  PLAN:  PT FREQUENCY: 2x/week  PT DURATION: 12 weeks  PLANNED INTERVENTIONS: 97164- PT Re-evaluation, 97110-Therapeutic exercises, 97530- Therapeutic activity, 97112- Neuromuscular re-education, 97535- Self Care, 21308- Manual therapy, [phone]- Gait training, Balance training, Stair training, Cryotherapy, and Moist heat  PLAN FOR NEXT SESSION:  Discharge patient today with goals met   Lenda Kelp PT  Physical Therapist- [facility]  [facility]

## 2023-08-17 NOTE — Progress Notes (Unsigned)
Electrophysiology Clinic Note    Date:  08/19/2023  Patient ID:  Krystal [name], Krystal [name] April 07, 1951, MRN [id number] PCP:  Jerl Mina, MD  Cardiologist:  Lorine Bears, MD Electrophysiologist: Lanier Prude, MD   Discussed the use of AI scribe software for clinical note transcription with the patient, who gave verbal consent to proceed.   Patient Profile     Chief Complaint: 3mon ablation follow-up  History of Present Illness: Krystal [name] is a 73 y.o. female with PMH notable for persis Afib, HTN, OSA; seen today for Lanier Prude, MD for routine electrophysiology followup and pre-op clearance.  She is s/p AF abltion with PVI, posterior wall, and CTI for typical flutter on 05/14/2023. I saw her 05/2023 for 1 mon post-procedure appt and she had no recurrence of AFib at that time on amiodarone.   She is planning for bilateral breast implant removals wth Dr. Ulice Bold, date TBD She is also planning for trigger finger release with Dr. Cliffton Asters on 09/18/23  On follow-up today, she is feeling very well. Has had no palpitation or AFib episodes that she is aware of. She continues to take amiodarone, but questions whether she should continue it.  Diligently takes eliquis BID, no bleeding concerns. She has ongoing lower extremity edema that is chronic. Wears compression socks daily.  She is using CPAP nasal pillow nightly, but thinks that it has put pressure on her jaw and caused her dental implant to move slightly. She has follow-up scheduled with dentist to further eval     AAD History: Flecainide, ineffective Propafenone, ineffective Amiodarone - currently     ROS:  Please see the history of present illness. All other systems are reviewed and otherwise negative.    Physical Exam    VS:  BP 138/78 (BP Location: Left Arm, Patient Position: Sitting)   Pulse 75   Ht 5\' 3"  (1.6 m)   Wt 175 lb 3.2 oz (79.5 kg)   SpO2 98%   BMI 31.04 kg/m  BMI: Body mass index is 31.04  kg/m.  Wt Readings from Last 3 Encounters:  08/19/23 175 lb 3.2 oz (79.5 kg)  07/19/23 175 lb (79.4 kg)  06/11/23 173 lb 12.8 oz (78.8 kg)     GEN- The patient is well appearing, alert and oriented x 3 today.   Lungs- Clear to ausculation bilaterally, normal work of breathing.  Heart- Regular rate and rhythm, no murmurs, rubs or gallops Extremities- Trace peripheral edema, warm, dry    Studies Reviewed   Previous EP, cardiology notes.    EKG is ordered. Personal review of EKG from today shows:    EKG Interpretation Date/Time:  Monday August 19 2023 13:37:34 EST Ventricular Rate:  75 PR Interval:  234 QRS Duration:  96 QT Interval:  452 QTC Calculation: 504 R Axis:   -3  Text Interpretation: Sinus rhythm with 1st degree A-V block Anteroseptal infarct , age undetermined Confirmed by Sherie Don [phone]) on 08/19/2023 1:53:52 PM    Cardiac CTA, 05/08/2023 1. There is normal pulmonary vein drainage into the left atrium. (2 on the right and 2 on the left).  2. The left atrial appendage is a Windsock type with two lobes and ostial size 30 x 22 mm and length 38 mm, Area 52 mm2. There is no thrombus in the left atrial appendage.  3. The esophagus runs in the left atrial midline and is not in the proximity to any of the pulmonary veins.  4. Coronary  calcium score of 0.  Long Term Monitor, 06/21/2022 Patient had a min HR of 56 bpm, max HR of 194 bpm, and avg HR of 90 bpm. Predominant underlying rhythm was Sinus Rhythm. First Degree AV Block was present.  2 wide-complex tachycardia runs occurred, the run with the fastest interval lasting 7 beats with a max rate of 193 bpm (avg 179 bpm); the run with the fastest interval was also the longest.  These could be due to ventricular tachycardia versus SVT with aberrancy. Atrial Fibrillation/Flutter occurred (29% burden), ranging from 56-194 bpm (avg of 105 bpm), the longest lasting 1 day 6 hours with an avg rate of 114 bpm. Atrial  Fibrillation was present at activation of device. Atrial Fibrillation/Flutter was detected within +/- 45 seconds of symptomatic patient event(s).  Rare PACs and rare PVCs.   TTE bubble study, 02/27/2022  1. Left ventricular ejection fraction, by estimation, is 65 to 70%. The left ventricle has normal function. The left ventricle has no regional wall motion abnormalities. Left ventricular diastolic parameters were normal.   2. Right ventricular systolic function is normal. The right ventricular size is normal.   3. The mitral valve is normal in structure. Mild to moderate mitral valve regurgitation.   4. Tricuspid valve regurgitation is mild to moderate.   5. The aortic valve is normal in structure. Aortic valve regurgitation is not visualized.        Assessment and Plan   #) aflutter #) afib S/p afib, flutter ablation 05/14/2023 with Dr. Lalla Brothers Maintaining sinus rhythm  Will stop amiodarone at this time Continue dilt 120mg  BID   #) Hypercoag d/t persis afib CHA2DS2-VASc Score = 3 [CHF History: 0, HTN History: 1, Diabetes History: 0, Stroke History: 0, Vascular Disease History: 0, Age Score: 1, Gender Score: 1].  Therefore, the patient's annual risk of stroke is 3.2 %.    Stroke ppx - 5mg  eliquis BID, appropriately dosed No bleeding concerns   #) HTN Well-controlled    #) OSA Encouraged nightly cpap usage  #) pre-operative evaluation Planning for bilateral implant removal Also planning for trigger finger release RCRI = 0, indicating a 0.4% risk of perioperative major cardiac event No additional testing or workup needed Has already received clearance to hold eliquis 2 days prior to procedure      Current medicines are reviewed at length with the patient today.   The patient has concerns regarding her medicines.  The following changes were made today:   STOP amiodarone   Labs/ tests ordered today include:  Orders Placed This Encounter  Procedures   EKG 12-Lead      Disposition: Follow up with Dr. Lalla Brothers or EP APP in 6 months   Signed, Sherie Don, NP  08/19/23  3:50 PM  Electrophysiology [facility]

## 2023-08-19 ENCOUNTER — Telehealth: Payer: Self-pay | Admitting: Cardiology

## 2023-08-19 ENCOUNTER — Ambulatory Visit: Payer: Medicare HMO | Attending: Cardiology | Admitting: Cardiology

## 2023-08-19 ENCOUNTER — Ambulatory Visit: Payer: Medicare HMO | Admitting: Physician Assistant

## 2023-08-19 VITALS — BP 138/78 | HR 75 | Ht 63.0 in | Wt 175.2 lb

## 2023-08-19 DIAGNOSIS — I10 Essential (primary) hypertension: Secondary | ICD-10-CM | POA: Diagnosis not present

## 2023-08-19 DIAGNOSIS — I4819 Other persistent atrial fibrillation: Secondary | ICD-10-CM | POA: Diagnosis not present

## 2023-08-19 DIAGNOSIS — I4892 Unspecified atrial flutter: Secondary | ICD-10-CM | POA: Diagnosis not present

## 2023-08-19 DIAGNOSIS — Z79899 Other long term (current) drug therapy: Secondary | ICD-10-CM

## 2023-08-19 DIAGNOSIS — Z01818 Encounter for other preprocedural examination: Secondary | ICD-10-CM | POA: Diagnosis not present

## 2023-08-19 NOTE — Telephone Encounter (Signed)
   Pre-operative Risk Assessment    Patient Name: Krystal [name]  DOB: 1951-04-30 MRN: [id number]   Date of last office visit: 06/11/23  Date of next office visit: 08/19/23   Request for Surgical Clearance    Procedure:  Right middle & ring fingher trigger release  Date of Surgery:  Clearance 09/18/23                                Surgeon:  Dr. Casandra Doffing Surgeon's Group or Practice Name:  [facility] Phone number: [phone]  Fax number:  [phone]   Type of Clearance Requested:   Pharamcy, Eliquis   Type of Anesthesia:  Not Indicated   Additional requests/questions:    SignedNarda Amber   08/19/2023, 10:29 AM

## 2023-08-19 NOTE — Patient Instructions (Signed)
Medication Instructions:   STOP Amiodarone   *If you need a refill on your cardiac medications before your next appointment, please call your pharmacy*   Lab Work:  None Ordered  If you have labs (blood work) drawn today and your tests are completely normal, you will receive your results only by: MyChart Message (if you have MyChart) OR A paper copy in the mail If you have any lab test that is abnormal or we need to change your treatment, we will call you to review the results.   Testing/Procedures:  None Ordered   Follow-Up: At [facility], you and your health needs are our priority.  As part of our continuing mission to provide you with exceptional heart care, we have created designated Provider Care Teams.  These Care Teams include your primary Cardiologist (physician) and Advanced Practice Providers (APPs -  Physician Assistants and Nurse Practitioners) who all work together to provide you with the care you need, when you need it.  We recommend signing up for the patient portal called "MyChart".  Sign up information is provided on this After Visit Summary.  MyChart is used to connect with patients for Virtual Visits (Telemedicine).  Patients are able to view lab/test results, encounter notes, upcoming appointments, etc.  Non-urgent messages can be sent to your provider as well.   To learn more about what you can do with MyChart, go to [email or web address].    Your next appointment:   6 month(s)  Provider:   Steffanie Dunn, MD or Sherie Don, NP       ]

## 2023-08-20 ENCOUNTER — Ambulatory Visit: Payer: Medicare HMO | Admitting: Physical Therapy

## 2023-08-20 NOTE — Telephone Encounter (Signed)
Patient seen 08/19/2023 by Sherie Don, NP. Clearance provided in that note as well as instructions regarding holding Eliquis. Office note faxed to surgeon. Note will be removed from preop pool. Tereso Newcomer, PA-C 08/20/2023 11:24 AM

## 2023-08-21 DIAGNOSIS — I10 Essential (primary) hypertension: Secondary | ICD-10-CM | POA: Diagnosis not present

## 2023-08-21 DIAGNOSIS — E119 Type 2 diabetes mellitus without complications: Secondary | ICD-10-CM | POA: Diagnosis not present

## 2023-08-21 DIAGNOSIS — E785 Hyperlipidemia, unspecified: Secondary | ICD-10-CM | POA: Diagnosis not present

## 2023-08-22 ENCOUNTER — Ambulatory Visit: Payer: Medicare HMO | Admitting: Physical Therapy

## 2023-08-26 ENCOUNTER — Other Ambulatory Visit: Payer: Self-pay | Admitting: Plastic Surgery

## 2023-08-26 DIAGNOSIS — Z9013 Acquired absence of bilateral breasts and nipples: Secondary | ICD-10-CM | POA: Diagnosis not present

## 2023-08-26 DIAGNOSIS — Z421 Encounter for breast reconstruction following mastectomy: Secondary | ICD-10-CM | POA: Diagnosis not present

## 2023-08-26 DIAGNOSIS — T85898A Other specified complication of other internal prosthetic devices, implants and grafts, initial encounter: Secondary | ICD-10-CM | POA: Diagnosis not present

## 2023-08-26 DIAGNOSIS — N644 Mastodynia: Secondary | ICD-10-CM | POA: Diagnosis not present

## 2023-08-26 DIAGNOSIS — Z853 Personal history of malignant neoplasm of breast: Secondary | ICD-10-CM | POA: Diagnosis not present

## 2023-08-27 ENCOUNTER — Ambulatory Visit: Payer: Medicare HMO | Admitting: Physical Therapy

## 2023-08-27 LAB — SURGICAL PATHOLOGY

## 2023-08-28 DIAGNOSIS — Z0000 Encounter for general adult medical examination without abnormal findings: Secondary | ICD-10-CM | POA: Diagnosis not present

## 2023-08-28 DIAGNOSIS — E785 Hyperlipidemia, unspecified: Secondary | ICD-10-CM | POA: Diagnosis not present

## 2023-08-28 DIAGNOSIS — I129 Hypertensive chronic kidney disease with stage 1 through stage 4 chronic kidney disease, or unspecified chronic kidney disease: Secondary | ICD-10-CM | POA: Diagnosis not present

## 2023-08-28 DIAGNOSIS — I4891 Unspecified atrial fibrillation: Secondary | ICD-10-CM | POA: Diagnosis not present

## 2023-08-28 DIAGNOSIS — Z1331 Encounter for screening for depression: Secondary | ICD-10-CM | POA: Diagnosis not present

## 2023-08-28 DIAGNOSIS — J3489 Other specified disorders of nose and nasal sinuses: Secondary | ICD-10-CM | POA: Diagnosis not present

## 2023-08-28 DIAGNOSIS — E1122 Type 2 diabetes mellitus with diabetic chronic kidney disease: Secondary | ICD-10-CM | POA: Diagnosis not present

## 2023-08-28 DIAGNOSIS — G4733 Obstructive sleep apnea (adult) (pediatric): Secondary | ICD-10-CM | POA: Diagnosis not present

## 2023-08-28 DIAGNOSIS — D631 Anemia in chronic kidney disease: Secondary | ICD-10-CM | POA: Diagnosis not present

## 2023-08-29 ENCOUNTER — Ambulatory Visit: Payer: Medicare HMO | Admitting: Physical Therapy

## 2023-09-03 ENCOUNTER — Ambulatory Visit: Payer: Medicare HMO | Admitting: Physical Therapy

## 2023-09-03 ENCOUNTER — Ambulatory Visit: Payer: Self-pay | Admitting: Physician Assistant

## 2023-09-03 VITALS — BP 151/79 | HR 78

## 2023-09-03 DIAGNOSIS — Z9013 Acquired absence of bilateral breasts and nipples: Secondary | ICD-10-CM

## 2023-09-03 NOTE — Progress Notes (Signed)
Patient is a pleasant 73 year old female with history of bilateral mastectomy with implant-based reconstruction now s/p removal of implants with flap closure performed 08/26/2023 by Dr. Ulice Bold who presents to clinic for postoperative follow-up.  Today, patient is doing well.  She is not requiring any analgesics.  She states that she has drained approximately 15 to 20 cc/day from the left side for the past few days, but the right sided drain output has varied between 30 to 50 cc.  She states that the right side has been problematic in the past, as well.  She denies any leg swelling beyond her baseline.  She also denies any fevers, chest pain, difficulty breathing, or other concerns.  She is tolerating p.o. intake well, ambulatory and voiding.  She is pleased with her decision to remove her breast implants.  On exam, sanguinous output in each drain and bulb.  Breasts have relatively good symmetry, but the right breast is a bit more swollen.  Ecchymoses bilaterally, but right greater than left.  The ecchymoses on the right side extends inferiorly towards her hip, as well.  The skin is not too taut and there is no palpable hematoma.  Steri-Strips remain intact.  Breasts are soft and nontender throughout.  She is on Eliquis for atrial fibrillation which is contributing to her moderate ecchymoses throughout.  Given the slightly more swollen appearance on the right side in addition to higher volume output, will leave the right drain in place for another week.  The left drain was removed without complication or difficulty, well-tolerated by patient.  Follow-up in 1 week, sooner if needed.  Picture(s) obtained of the patient and placed in the chart were with the patient's or guardian's permission.

## 2023-09-05 ENCOUNTER — Ambulatory Visit: Payer: Medicare HMO | Admitting: Physical Therapy

## 2023-09-10 ENCOUNTER — Ambulatory Visit: Payer: Medicare HMO | Admitting: Physical Therapy

## 2023-09-10 ENCOUNTER — Encounter: Payer: Self-pay | Admitting: Surgical

## 2023-09-10 ENCOUNTER — Ambulatory Visit (INDEPENDENT_AMBULATORY_CARE_PROVIDER_SITE_OTHER): Payer: Medicare HMO | Admitting: Surgical

## 2023-09-10 VITALS — BP 152/80 | HR 72

## 2023-09-10 DIAGNOSIS — Z9889 Other specified postprocedural states: Secondary | ICD-10-CM

## 2023-09-10 DIAGNOSIS — Z170 Estrogen receptor positive status [ER+]: Secondary | ICD-10-CM

## 2023-09-10 DIAGNOSIS — Z9013 Acquired absence of bilateral breasts and nipples: Secondary | ICD-10-CM

## 2023-09-10 DIAGNOSIS — I4891 Unspecified atrial fibrillation: Secondary | ICD-10-CM

## 2023-09-10 DIAGNOSIS — C50512 Malignant neoplasm of lower-outer quadrant of left female breast: Secondary | ICD-10-CM

## 2023-09-10 NOTE — Progress Notes (Signed)
Patient is a 73 year old female here for follow-up after removal of bilateral breast implants and flat closure on 08/26/2023 with Dr. Ulice Bold.  She previously had bilateral mastectomies with implant-based reconstruction.  She reports that her drain on the right side fell out over the weekend, specifically Friday.  She reports prior to this it was draining approximately 1 to 2 cc or so every 24 hours or even less.  She reports otherwise she has been feeling fine.  Is not having any infectious symptoms.  She denies any fevers or chills or nausea or vomiting.  She is not having any shortness of breath or chest pain.  She reports she has been replacing some bandages on her breast due to the previous ones falling off.  She has Band-Aids over some of the incisions and Saran wrap on the right lateral breast wound.  Chaperone present on exam On exam she has palpable subcutaneous fluid collection of the right breast, she also has eschar present on the right lateral breast.  See photo below  There is no cellulitic changes noted, some mild surrounding redness is present, appears irritative and reactive, noninfectious at this time.  Left breast incision is intact, healing well.  No erythema or cellulitic changes noted.  No subcutaneous fluid collection noted to palpation.  A/P:  Patient is overall doing well from a symptomatic standpoint.  She is not having any infectious symptoms.  She did have a palpable subcutaneous fluid collection on the right which was drained in the office today, sterile technique was used.  She tolerated this well.  105 cc of dark sanguinous fluid was aspirated.  Immediate reduction of the subcutaneous fluid collection was noted.  In regards to the eschar and wound developing on the right lateral breast, recommend Vaseline and gauze to this 1-2 times daily.  Will continue to closely monitor the right lateral wound and monitor the right surgical site for ongoing subcutaneous fluid  collection.  I do feel as if the erythema of the right breast is reactive and noninfectious at this time.  Will closely monitor.  Pictures were obtained of the patient and placed in the chart with the patient's or guardian's permission.

## 2023-09-12 ENCOUNTER — Ambulatory Visit: Payer: Medicare HMO | Admitting: Physical Therapy

## 2023-09-16 ENCOUNTER — Encounter: Payer: Self-pay | Admitting: Urology

## 2023-09-16 ENCOUNTER — Ambulatory Visit (INDEPENDENT_AMBULATORY_CARE_PROVIDER_SITE_OTHER): Payer: Medicare HMO | Admitting: Physician Assistant

## 2023-09-16 ENCOUNTER — Ambulatory Visit: Payer: Medicare HMO | Admitting: Urology

## 2023-09-16 VITALS — BP 163/76 | HR 71 | Temp 98.3°F

## 2023-09-16 VITALS — BP 161/79 | HR 72 | Ht 63.0 in | Wt 175.2 lb

## 2023-09-16 DIAGNOSIS — R32 Unspecified urinary incontinence: Secondary | ICD-10-CM

## 2023-09-16 DIAGNOSIS — N3946 Mixed incontinence: Secondary | ICD-10-CM | POA: Diagnosis not present

## 2023-09-16 DIAGNOSIS — Z9013 Acquired absence of bilateral breasts and nipples: Secondary | ICD-10-CM

## 2023-09-16 MED ORDER — GEMTESA 75 MG PO TABS: 75.0000 mg | ORAL_TABLET | Freq: Every day | ORAL | 3 refills | Status: AC

## 2023-09-16 MED ORDER — GEMTESA 75 MG PO TABS: 75.0000 mg | ORAL_TABLET | Freq: Every day | ORAL | 0 refills | Status: AC

## 2023-09-16 NOTE — Progress Notes (Signed)
 09/16/2023 2:23 PM   Krystal [name] 1951/06/21 [id number]  Referring provider: Jerl Mina, MD [address] [facility] [place],  [place] [address]  Chief Complaint  Patient presents with   Follow-up    HPI:  was consulted to assist the patient's urinary incontinence.  She has urge incontinence.  She leaks with coughing sneezing bending.  Both are significant.  She leaks when she gets in and out of her Zenaida Niece and when she goes from a sitting to standing position.  She can soak up to 5 pads a day.  No bedwetting  She voids every hour but might be able to hold it for 2 hours.  She knows her every bathroom is.  Gets up 4 times a night.  She is on a diuretic.  She has ankle edema  She had a bladder suspension 30 years ago.  She had a hysterectomy.  She is prone to constipation     Well supported bladder neck and no stress incontinence   patient has mixed incontinence.  Call if culture is positive.  She has hourly frequency and significant nocturia and likely has nocturnal diuresis she will return for urodynamics and cystoscopy.  Based upon the pelvic examination and severity of incontinence I would not be surprised if her primary problem is overactive bladder   Today Frequency stable.  Incontinence stable.  Last culture she is Streptococcus in the urine On urodynamics she did not void and was catheterized for 25 mL.  Maximum bladder capacity was 320 mL.  She had increased bladder sensation.  Bladder was unstable reaching pressure of 11 cm of water.  She had urgency but no incontinence.  No stress incontinence with Valsalva pressure of 139 cm of water.  During voiding she voided 245 mL with maximal flow of 7 mils per second.  Maximum voiding pressure 12 cm of water.  Residual 62 mL.  EMG activity mildly increased.  Some of the contractions were provoked with coughing and Valsalva.  She also leaks with positional changes.  She had a long prolonged voiding pattern.  The details of the  urodynamics are signed dictated  Cystoscopy: Patient underwent flexible cystoscopy utilizing sterile technique.  Bladder mucosa and trigone were normal.  No cysts foreign body or carcinoma.  Urine was a little bit cloudy so I sent it for culture again     Patient has mixed incontinence.  Approximately 80% of problem is overactive bladder with triggering with positional changes and standing.  High-volume leakage due to overactive bladder.  Refractory treatments would be the treatment of choice.  A bulking agent is some to consider if she wanted her stress incontinence treated.  Reassess in 6 weeks on Myrbetriq 50 mg samples and prescription.  Call if urine culture positive.  Consider prophylaxis if culture is positive again   Frequency stable Last culture was positive.  Culture prior to this was positive with low count Streptococcus.  She failed Myrbetriq.  Frequency stable   Patient completely dry on Gemtesa and is very happy.  Minimal burning.  Frequency improved.     Today Patient doing beautifully and almost completely dry on Gemtesa.  No infections.  Frequency improved   PMH: Past Medical History:  Diagnosis Date   Anemia    Arthritis    osteo   Blindness of right eye at birth   Cancer [facility]) 2011   Left breast 2011 and cervical age 82   Carpal tunnel syndrome    Cataract  Depression    Edema 07/27/2011   Fibromyalgia    muscle weakness and pain   GERD (gastroesophageal reflux disease)    Hyperlipidemia    Hypertension    benign   Lymphedema    Osteoporosis    Plantar fasciitis    Pre-diabetes    Raynaud's disease    Restless leg syndrome    Rosacea    Rotator cuff rupture    Sleep apnea    Synovitis    Ulcer     Surgical History: Past Surgical History:  Procedure Laterality Date   ABDOMINAL HYSTERECTOMY  1986   For bleeding and pain   ATRIAL FIBRILLATION ABLATION N/A 05/14/2023   Procedure: ATRIAL FIBRILLATION ABLATION;  Surgeon: Lanier Prude, MD;   Location: [facility];  Service: Cardiovascular;  Laterality: N/A;   BLADDER SURGERY  2006   Bladder tack   BREAST RECONSTRUCTION Bilateral 09/17/2012   Procedure: BREAST RECONSTRUCTION;  Surgeon: Wayland Denis, DO;  Location: [facility];  Service: Plastics;  Laterality: Bilateral;  BILATERAL BREAST RECONSTRUCTION WITH TISSUE EXPANDERS AND ALLOMED   BREAST RECONSTRUCTION Right 06/03/2013   Procedure: RIGHT BREAST CAPSULE CONSTRACTURE;  Surgeon: Wayland Denis, DO;  Location: [facility];  Service: Plastics;  Laterality: Right;   CARDIOVERSION N/A 08/06/2022   Procedure: CARDIOVERSION;  Surgeon: Iran Ouch, MD;  Location: [facility];  Service: Cardiovascular;  Laterality: N/A;   CARDIOVERSION N/A 10/02/2022   Procedure: CARDIOVERSION;  Surgeon: Yvonne Kendall, MD;  Location: [facility];  Service: Cardiovascular;  Laterality: N/A;   CARPAL TUNNEL RELEASE Bilateral 2008   EYE SURGERY Right    INJECTION KNEE Left 08/12/2018   Procedure: KNEE INJECTION-LEFT;  Surgeon: Christena Flake, MD;  Location: [facility];  Service: Orthopedics;  Laterality: Left;   JOINT REPLACEMENT     LIPOSUCTION Bilateral 01/29/2013   Procedure: LIPOSUCTION;  Surgeon: Wayland Denis, DO;  Location: [facility];  Service: Plastics;  Laterality: Bilateral;   LIPOSUCTION Right 06/03/2013   Procedure: LIPOSUCTION;  Surgeon: Wayland Denis, DO;  Location: [facility];  Service: Plastics;  Laterality: Right;   MASTECTOMY  2012   rt prophalactic mast-snbx   MASTECTOMY MODIFIED RADICAL  2012   left-axillary nodes   NEUROMA SURGERY Bilateral    NOSE SURGERY  2007   PORT-A-CATH REMOVAL     insertion and   THROAT SURGERY     TONSILLECTOMY     TOTAL KNEE ARTHROPLASTY Right 08/12/2018   Procedure: TOTAL KNEE ARTHROPLASTY-RIGHT;  Surgeon: Christena Flake, MD;  Location: [facility];  Service: Orthopedics;  Laterality: Right;   TOTAL KNEE ARTHROPLASTY Left 09/22/2019   Procedure:  TOTAL KNEE ARTHROPLASTY;  Surgeon: Christena Flake, MD;  Location: [facility];  Service: Orthopedics;  Laterality: Left;   UVULOPALATOPHARYNGOPLASTY      Home Medications:  Allergies as of 09/16/2023       Reactions   Cephalexin Nausea Only   Diaphoresis / Sweating (intolerance)   Levaquin [levofloxacin] Other (See Comments)   Stiff joints, unable to walk.        Medication List        Accurate as of September 16, 2023  2:23 PM. If you have any questions, ask your nurse or doctor.          acetaminophen-codeine 300-30 MG tablet Commonly known as: TYLENOL #3 Take 1 tablet by mouth 3 (three) times daily as needed.   alendronate 70 MG tablet Commonly known as: FOSAMAX Take  70 mg by mouth once a week. Take with a full glass of water on an empty stomach.   apixaban 5 MG Tabs tablet Commonly known as: ELIQUIS Take 1 tablet (5 mg total) by mouth 2 (two) times daily.   cetirizine 10 MG tablet Commonly known as: ZYRTEC Take 10 mg by mouth daily.   cholecalciferol 25 MCG (1000 UNIT) tablet Commonly known as: VITAMIN D3 Take 1,000 Units by mouth daily.   diltiazem 120 MG 24 hr capsule Commonly known as: CARDIZEM CD Take 1 capsule (120 mg total) by mouth in the morning and at bedtime.   DULoxetine 60 MG capsule Commonly known as: CYMBALTA Take 60 mg by mouth daily.   fluticasone 50 MCG/ACT nasal spray Commonly known as: FLONASE Place 2 sprays into both nostrils daily.   folic acid 1 MG tablet Commonly known as: FOLVITE Take 1 mg by mouth daily.   furosemide 20 MG tablet Commonly known as: LASIX Take 20 mg by mouth 2 (two) times daily.   levocetirizine 5 MG tablet Commonly known as: XYZAL Take 5 mg by mouth every evening.   LYSINE ACETATE PO Take 1,000 mg by mouth daily as needed (fever blister).   magnesium oxide 400 (240 Mg) MG tablet Commonly known as: MAG-OX Take 400 mg by mouth daily as needed (low magnesium).   meloxicam 7.5 MG tablet Commonly known  as: MOBIC 7.5 mg by oral route.   montelukast 10 MG tablet Commonly known as: SINGULAIR Take 1 tablet by mouth at bedtime.   omeprazole 20 MG capsule Commonly known as: PRILOSEC Take 20 mg by mouth daily.   ondansetron 4 MG tablet Commonly known as: Zofran Take 1 tablet (4 mg total) by mouth every 8 (eight) hours as needed for nausea or vomiting.   oxybutynin 10 MG 24 hr tablet Commonly known as: DITROPAN-XL Take 1 tablet (10 mg total) by mouth at bedtime.   pantoprazole 40 MG tablet Commonly known as: Protonix Take 1 tablet (40 mg total) by mouth daily.   potassium chloride SA 20 MEQ tablet Commonly known as: KLOR-CON M Take 1 tablet (20 mEq total) by mouth 2 (two) times daily.   pregabalin 300 MG capsule Commonly known as: LYRICA Take 300 mg by mouth 2 (two) times daily.   rOPINIRole 1 MG tablet Commonly known as: REQUIP Take 1 mg by mouth at bedtime.   triamcinolone 0.025 % cream Commonly known as: KENALOG Apply 1 Application topically as needed (flare - up).   Vitamin D (Ergocalciferol) 1.25 MG (50000 UNIT) Caps capsule Commonly known as: DRISDOL Take 50,000 Units by mouth every 7 (seven) days.   zolpidem 10 MG tablet Commonly known as: AMBIEN Take 10 mg by mouth at bedtime.        Allergies:  Allergies  Allergen Reactions   Cephalexin Nausea Only    Diaphoresis / Sweating (intolerance)   Levaquin [Levofloxacin] Other (See Comments)    Stiff joints, unable to walk.    Family History: Family History  Problem Relation Age of Onset   Cancer Mother 42       non hodgkins lymphoma   Cancer Father        neck, throat, lung   Lung cancer Father    Throat cancer Father    Hypertension Sister    Cancer Sister 11       Breast cancer dx'd 04/2011   Hypertension Brother    Breast cancer Maternal Aunt    Liver cancer Maternal Uncle  Cancer Maternal Grandmother        died 28, unknown cancer   Cancer Maternal Grandfather        died in his 4s;  unknown cancer   Lung cancer Maternal Uncle    Throat cancer Maternal Uncle    Breast cancer Maternal Aunt    Cancer Cousin        died under the age 62, unknown cancer   Cancer Cousin        died age 70; unknown cancer    Social History:  reports that she has never smoked. She has never been exposed to tobacco smoke. She has never used smokeless tobacco. She reports that she does not currently use alcohol. She reports that she does not use drugs.  ROS:                                        Physical Exam: There were no vitals taken for this visit.  Constitutional:  Alert and oriented, No acute distress. HEENT: [place] AT, moist mucus membranes.  Trachea midline, no masses.   Laboratory Data: Lab Results  Component Value Date   WBC 5.5 05/08/2023   HGB 14.5 05/08/2023   HCT 43.8 05/08/2023   MCV 89.2 05/08/2023   PLT 256 05/08/2023    Lab Results  Component Value Date   CREATININE 0.84 05/14/2023    No results found for: "PSA"  No results found for: "TESTOSTERONE"  Lab Results  Component Value Date   HGBA1C 5.7 (H) 04/03/2019    Urinalysis    Component Value Date/Time   COLORURINE YELLOW (A) 02/27/2022 0106   APPEARANCEUR Clear 08/13/2022 1407   LABSPEC 1.012 02/27/2022 0106   LABSPEC 1.005 04/06/2016 0937   PHURINE 5.0 02/27/2022 0106   GLUCOSEU Negative 08/13/2022 1407   GLUCOSEU Negative 04/06/2016 0937   HGBUR SMALL (A) 02/27/2022 0106   BILIRUBINUR Negative 08/13/2022 1407   BILIRUBINUR Negative 04/06/2016 0937   KETONESUR NEGATIVE 02/27/2022 0106   PROTEINUR Trace (A) 08/13/2022 1407   PROTEINUR NEGATIVE 02/27/2022 0106   UROBILINOGEN 0.2 04/06/2016 0937   NITRITE Negative 08/13/2022 1407   NITRITE NEGATIVE 02/27/2022 0106   LEUKOCYTESUR Negative 08/13/2022 1407   LEUKOCYTESUR LARGE (A) 02/27/2022 0106   LEUKOCYTESUR Negative 04/06/2016 0937    Pertinent Imaging:   Assessment & Plan: 90 x 3 sent to pharmacy.  Few weeks  of samples for donut hole given.  Reassess 1 year  1. Urinary incontinence, unspecified type (Primary)  - Urinalysis, Complete  2. Mixed incontinence  - Urinalysis, Complete   No follow-ups on file.  Martina Sinner, MD  [facility] [address], [address] [place], [place] [address] [phone]

## 2023-09-16 NOTE — Progress Notes (Signed)
 Patient is a pleasant 73 year old female with history of bilateral mastectomy with implant-based reconstruction now s/p removal of implants with flap closure performed 08/26/2023 by Dr. Ulice Bold who presents to clinic for postoperative follow-up.   She was last seen here in clinic on 09/10/2023.  At that time, she had palpable subcutaneous fluid collection involving right breast with eschar present on the right lateral aspect of the incision.  No cellulitic changes noted.  105 cc of dark sanguinous fluid was aspirated from her hematoma.  There was immediate reduction of the subcutaneous fluid collection noted following the procedure.  Recommending Vaseline and gauze over the eschar 1-2 times daily.  No evidence concerning for infection.  Plan was for her to follow-up in 1 week.  Today, she presents to the clinic with concerns of bleeding.  She tells me that over the weekend she started spontaneously draining a large amount of dark, almost black blood.  She states that she has had replaced the gauze frequently.  No significant pain, but given the large quantity of drainage she was concerned and wanted to be seen urgently for evaluation.  She stopped her anticoagulation shortly after noticing the bleeding.  On exam, the right sided mastectomy site s/p implant removal is no longer particularly swollen with no obvious underlying hematoma.  The small wounds out laterally where there was eschar has now softened.  There is some serosanguineous drainage noted from these wounds.  No surrounding erythema or induration otherwise concerning for infection.  No bright red bleeding.  This was not unexpected given her large hematoma on the right side.  It has simply found the path of least resistance being her small wounds on lateral aspect of incision.  She describes a liquefied hematoma rather than any acute or new bleeding.  Can resume anticoagulation from our standpoint.  She states the drainage has slowed considerably  in the past 24 hours.  She is applying maxipads regularly.  Recommend continued dressings as needed for drainage.  Follow-up later this week.  Instructed patient to call the office if she having questions or concerns in interim.  Picture(s) obtained of the patient and placed in the chart were with the patient's or guardian's permission.   Today's Vitals   09/16/23 1137  BP: (!) 163/76  Pulse: 71  Temp: 98.3 F (36.8 C)  TempSrc: Oral  SpO2: 97%   There is no height or weight on file to calculate BMI.

## 2023-09-17 ENCOUNTER — Ambulatory Visit: Payer: Medicare HMO | Admitting: Physical Therapy

## 2023-09-19 ENCOUNTER — Other Ambulatory Visit (HOSPITAL_COMMUNITY)
Admission: RE | Admit: 2023-09-19 | Discharge: 2023-09-19 | Disposition: A | Discharge status: Home / Self Care | Attending: Surgical | Admitting: Surgical

## 2023-09-19 ENCOUNTER — Ambulatory Visit (INDEPENDENT_AMBULATORY_CARE_PROVIDER_SITE_OTHER): Payer: Medicare HMO | Admitting: Surgical

## 2023-09-19 ENCOUNTER — Ambulatory Visit: Payer: Medicare HMO | Admitting: Physical Therapy

## 2023-09-19 VITALS — BP 134/74 | HR 78 | Temp 97.7°F

## 2023-09-19 DIAGNOSIS — Z9013 Acquired absence of bilateral breasts and nipples: Secondary | ICD-10-CM | POA: Insufficient documentation

## 2023-09-19 DIAGNOSIS — Z170 Estrogen receptor positive status [ER+]: Secondary | ICD-10-CM | POA: Insufficient documentation

## 2023-09-19 DIAGNOSIS — C50512 Malignant neoplasm of lower-outer quadrant of left female breast: Secondary | ICD-10-CM

## 2023-09-19 DIAGNOSIS — N3946 Mixed incontinence: Secondary | ICD-10-CM | POA: Insufficient documentation

## 2023-09-19 DIAGNOSIS — R32 Unspecified urinary incontinence: Secondary | ICD-10-CM | POA: Insufficient documentation

## 2023-09-19 DIAGNOSIS — Z9889 Other specified postprocedural states: Secondary | ICD-10-CM

## 2023-09-19 MED ORDER — SULFAMETHOXAZOLE-TRIMETHOPRIM 800-160 MG PO TABS: 1.0000 | ORAL_TABLET | Freq: Two times a day (BID) | ORAL | 0 refills | Status: AC

## 2023-09-19 MED ORDER — TRAMADOL HCL 50 MG PO TABS: 50.0000 mg | ORAL_TABLET | Freq: Three times a day (TID) | ORAL | 0 refills | Status: AC | PRN

## 2023-09-19 NOTE — Progress Notes (Addendum)
 Patient is a 73 year old female here for follow-up after removal of bilateral breast implants and flap closure.  She has a history of bilateral mastectomy and breast reconstruction.  Removal of implant surgery was 08/26/2023.  She was seen on 09/10/2023, at that time, patient mentioned that the right JP drain had fallen out while at home.  On exam she had a subcutaneous fluid collection which was drained using sterile technique, 105 cc dark fluid was drained at that time.  She also had a wound of the right breast with developing eschar.  On 09/16/2023 she was seen and was doing well without issues.  She was noticing softening of the right breast wound with drainage present.  She presents today with increased swelling of the right breast, erythema, increased pain.  She reports that she is not having any chills.  She does feel as if she has had a fever, but has not taken her temperature.  She has been eating and drinking normally.  She does not have any chest pain or shortness of breath, but does feel constant pain on the right side.    Of note she is on Eliquis for history of A-fib.  She does report that she has had an ablation in the past.  She reports she has not been taking her Eliquis over the last few days.  Chaperone present on exam BP 134/74 (BP Location: Left Arm, Patient Position: Sitting, Cuff Size: Normal)   Pulse 78   Temp 97.7 F (36.5 C) (Oral)   SpO2 96%  She is well-developed, well-nourished, no acute distress, breathing is unlabored. She does have lower extremity swelling which is chronic, not worse than normal per patient On exam right breast is erythematous, firm, tender, right breast wound present with sloughing exudate noted laterally.  She does have subcutaneous fluid collection noted with palpation.  Scant amount of serous drainage noted from right breast wound.  Right breast without obvious fluctuance.  No crepitus noted.  Left breast with small amount of subcutaneous fluid  collection with palpation.  Left breast with some slight erythema medially, no cellulitic changes.  No tenderness noted.  No fluctuance or crepitus noted.  A/P:  73 year old female status post removal of breast implants, flat closure.  She postoperatively did have 105 cc of liquefied hematoma drained, tolerated this well.  Was seen a few days ago and was overall doing well with the exception of the right breast wound which was slowly beginning to soften and slough.  Today she presents with increased redness, pain to the right breast.  She is not having any overt infectious symptoms, does report subjective fevers but no measured temperatures.  She has been eating and drinking normally.  The right breast was aspirated using butterfly needle and sterile technique, she tolerated this well.  170 cc of cloudy serous fluid was aspirated.  No frank purulence noted.  Patient tolerated this well and verbalized relief of discomfort.  Would like patient to follow-up closely tomorrow, would like her to be evaluated by Dr. Ulice Bold as well.  Patient scheduled for early a.m. appointment.  Dr. Ulice Bold made aware of patient's symptoms and physical exam findings.  Will start patient on Bactrim for coverage, aspirated fluid was cultured and will send for susceptibilities.  Tramadol prescription sent for pain control.  Pictures were obtained of the patient and placed in the chart with the patient's or guardian's permission.  Discussed with patient that if she develops any worsening symptoms, that she needs to notify our  office immediately, may need further evaluation in the emergency room if she develops worsening fevers, chills, fatigue.  Patient was agreeable to notify us of any symptomatic changes.  She is aware that we are on call 24/7 for any questions or concerns.

## 2023-09-20 ENCOUNTER — Ambulatory Visit (INDEPENDENT_AMBULATORY_CARE_PROVIDER_SITE_OTHER): Payer: Medicare HMO | Admitting: Physician Assistant

## 2023-09-20 VITALS — BP 144/78 | HR 79 | Temp 98.2°F

## 2023-09-20 DIAGNOSIS — Z9013 Acquired absence of bilateral breasts and nipples: Secondary | ICD-10-CM

## 2023-09-20 NOTE — Progress Notes (Signed)
 Patient is a pleasant 73 year old female with history of bilateral mastectomy with implant-based reconstruction now s/p removal of implants with flap closure performed 08/26/2023 by Dr. Ulice Bold who presents to clinic for postoperative follow-up.   She was last seen here in clinic yesterday.  At that time, her right breast was no longer draining through her lateral breast wound and instead had increased swelling and erythema.  VSS and WNL.  170 cc cloudy serous fluid was aspirated.  She was placed on Bactrim and encouraged to follow-up the following day.  Aspirate was sent for culture, predominantly PMN with rare gram-positive cocci.   Today, patient reports that she has had continued drainage since she was seen yesterday.  The right breast remains swollen and "hot" she has been having some drainage from her incisional wounds, but also feels tight at the site of her implant removal.  She denies any fevers, chills, or other systemic symptoms.  She picked up the Bactrim and has been taking it, as directed.  On exam, right breast is erythematous and swollen.  There are wounds with slough at base on the lateral aspect of the breast.  Moderate cloudy drainage from the wounds.  Breast is largely nontender throughout.  Culture suggests infected seroma, consistent with exam.  There was appropriate antibiotic selection with Bactrim for coverage of gram-positive cocci.    Dr. Ulice Bold also personally evaluated the patient at bedside.  Approximately 25 cc was aspirated from the right breast.  There was some reduction in the amount of swelling as well as patient reported tightness.  Recommended continued oral antibiotics and Vashe soaked gauze regularly.  Return early next week.  Discussed the possibility of return to the OR if there is not considerable improvement between now and next week.  Patient voices understanding and is agreeable to the plan.  Applied Vashe soaked gauze and encouraged her to apply it for 5  minutes daily to her incisional wounds.  Continue to replace gauze as needed.  Emphasized the importance of calling the office should she have any new or worsening symptoms over the weekend.    Vitals:   09/20/23 0939  BP: (!) 144/78  Pulse: 79  Temp: 98.2 F (36.8 C)  SpO2: 98%

## 2023-09-24 ENCOUNTER — Ambulatory Visit: Payer: Medicare HMO | Admitting: Physical Therapy

## 2023-09-24 ENCOUNTER — Ambulatory Visit: Payer: Medicare HMO | Admitting: Surgical

## 2023-09-24 ENCOUNTER — Telehealth: Payer: Self-pay

## 2023-09-24 VITALS — BP 162/78 | HR 71

## 2023-09-24 DIAGNOSIS — Z9013 Acquired absence of bilateral breasts and nipples: Secondary | ICD-10-CM

## 2023-09-24 DIAGNOSIS — Z170 Estrogen receptor positive status [ER+]: Secondary | ICD-10-CM

## 2023-09-24 DIAGNOSIS — Z9889 Other specified postprocedural states: Secondary | ICD-10-CM

## 2023-09-24 DIAGNOSIS — C50512 Malignant neoplasm of lower-outer quadrant of left female breast: Secondary | ICD-10-CM

## 2023-09-24 LAB — AEROBIC/ANAEROBIC CULTURE W GRAM STAIN (SURGICAL/DEEP WOUND)

## 2023-09-24 MED ORDER — SULFAMETHOXAZOLE-TRIMETHOPRIM 800-160 MG PO TABS: 1.0000 | ORAL_TABLET | Freq: Two times a day (BID) | ORAL | 0 refills | Status: AC

## 2023-09-24 NOTE — Telephone Encounter (Signed)
 Fax sent to [facility] with confirmation or receipt requesting wound supplies.

## 2023-09-24 NOTE — Progress Notes (Signed)
 73 year old female with history of bilateral mastectomy with implant-based reconstruction now status post removal of implants with Dr. Ulice Bold on 08/26/2023.  She developed an infection of the right breast, has had some serial aspirations, is currently on Bactrim.  She did have a culture sent from aspirated right breast fluid which was Staphylococcus aureus, pansensitive.  Patient had additional 25 cc of fluid aspirated from right breast on 09/20/2023.  She reports she is overall doing much better today in regards to the breast redness and fullness, but she is bothered by the wounds.  She does report that she has noticed ongoing drainage from the wounds.  She is not having any infectious symptoms.  She is requesting the area to be surgically closed.  Chaperone present on exam Patient is well-developed, well-nourished, no acute distress. On exam left breast incisions are intact and healing well, no subcutaneous fluid collection or erythema noted of left breast. On exam, right breast with postoperative swelling noted, minimal subcutaneous fluid collection noted with palpation.  She does have 2 wounds present, medial wound is 1.4 x 2.5 x 0.3 cm.  Lateral wound is 0.6 x 1.5 x 0.3 cm.  Both wounds are full-thickness.  There is exposed subcutaneous fat.  She does have an area medial to the wounds that has some skin flaking and erythema present.  There is some scabbing as well.  There is no crepitus noted, but there is some fluctuance present with palpation.  No cellulitic changes noted.  There is surrounding erythema by the wounds.  A/P:  73 year old female status post implant removal with flap closure.  Postoperatively she did develop lateral mastectomy flap necrosis with resulting wounds.  She subsequently had an infected right seroma which was aspirated, cultured.  Pansensitive Staph aureus was cultured, but she is currently on Bactrim.  She has been on Bactrim for 5 days with significant improvement  noted.  The right breast wound eschars have softened and she has some subcutaneous fat exposed.  There is no purulence noted.  Discussed with patient that these wounds will need to heal by secondary intention, she really would like them to be closed primarily.  Discussed with patient the importance of allowing them to heal by secondary intention and the reason for this.  We discussed that if the incision was primarily closed they may reopen.  We discussed that we can continue to monitor the area and if she notices significant movement, she can discuss possibility for primary closure with Dr. Ulice Bold in a few weeks.  We discussed recommendation for dressing changes daily to the right breast. We will order her some supplies through [facility].  Pictures were obtained of the patient and placed in the chart with the patient's or guardian's permission.  Recommend following up in 1 week for reevaluation.  We discussed I would like her to be on Bactrim for total of 14 days.

## 2023-09-25 DIAGNOSIS — S21001A Unspecified open wound of right breast, initial encounter: Secondary | ICD-10-CM | POA: Diagnosis not present

## 2023-09-26 ENCOUNTER — Ambulatory Visit: Payer: Medicare HMO | Admitting: Physical Therapy

## 2023-09-30 NOTE — Progress Notes (Unsigned)
 73 year old female here for follow-up after removal of bilateral breast implants.  She has a history of bilateral mastectomy.  Postoperatively she had fluid collection of the right breast which had some serial aspirations.  She did have culture of right breast fluid which was positive for Staph aureus, pansensitive.  She was placed on Bactrim.  Discussed with patient a recommendation for a total of 14 days of Bactrim.  Initial prescription was sent on 09/19/2023, she has approximately 2 days left.  Patient reports she has been doing well, she has been doing Vaseline and gauze dressing changes 1-2 times daily.  She reports she has been changing twice daily at a minimum due to the drainage.  She has been using Vashe soaked gauze on the wounds.  She is not having any infectious symptoms.  Redness is no longer present.  She reports she is [place] to the beach, [place] for [place] for a few days.  Chaperone present on exam On exam left breast incision is intact and healing well.  Right breast with 3 wounds present, continuing to improve from last appointment.  See photo below.  There is no surrounding cellulitic changes.  There is some active serous drainage noted on exam.  She does have an area of skin thinning medially where she had some previous fluctuance present, this area has opened and drained.  She does have some subcutaneous fat herniating through the opening.  The lateral and medial aspect of the right breast incision is intact. I do not appreciate any subcutaneous fluid collection of either breast.  A/P:  73 year old female status post implant removal with flat closure.  She did develop a infected seroma postoperatively, this has been managed with Bactrim, culture results positive for Staphylococcus aureus -pansensitive.  She has tolerated Bactrim without any issues.  She has 2 days left.  Right breast wounds are continuing to heal, new granulation tissue noted.  She does  have some exposed subcutaneous fat.  She did have some fat herniating through the medial breast wound where she previously had a fluid collection.  This has drained.  There is no active purulence today.  Silver nitrate was used on the herniating fat to allow the wound to close.  Recommend continuing with compressive garments, continuing with dressing changes 1-2 times daily with Vaseline and gauze.  Recommend continuing with Vashe soaked gauze to wounds once daily.  Recommend following up in 2 to 3 weeks for reevaluation.  Recommend calling with questions or concerns.

## 2023-10-01 ENCOUNTER — Ambulatory Visit: Payer: Medicare HMO | Admitting: Physical Therapy

## 2023-10-01 ENCOUNTER — Ambulatory Visit (INDEPENDENT_AMBULATORY_CARE_PROVIDER_SITE_OTHER): Payer: Medicare HMO | Admitting: Surgical

## 2023-10-01 VITALS — BP 169/78 | HR 73 | Wt 170.4 lb

## 2023-10-01 DIAGNOSIS — Z9013 Acquired absence of bilateral breasts and nipples: Secondary | ICD-10-CM

## 2023-10-01 DIAGNOSIS — Z170 Estrogen receptor positive status [ER+]: Secondary | ICD-10-CM

## 2023-10-01 DIAGNOSIS — Z9889 Other specified postprocedural states: Secondary | ICD-10-CM

## 2023-10-01 DIAGNOSIS — C50512 Malignant neoplasm of lower-outer quadrant of left female breast: Secondary | ICD-10-CM

## 2023-10-03 ENCOUNTER — Ambulatory Visit: Payer: Medicare HMO | Admitting: Physical Therapy

## 2023-10-08 ENCOUNTER — Ambulatory Visit: Payer: Medicare HMO | Admitting: Physical Therapy

## 2023-10-09 DIAGNOSIS — M150 Primary generalized (osteo)arthritis: Secondary | ICD-10-CM | POA: Diagnosis not present

## 2023-10-09 DIAGNOSIS — M797 Fibromyalgia: Secondary | ICD-10-CM | POA: Diagnosis not present

## 2023-10-10 ENCOUNTER — Ambulatory Visit: Payer: Medicare HMO | Admitting: Physical Therapy

## 2023-10-15 ENCOUNTER — Telehealth: Payer: Self-pay

## 2023-10-15 ENCOUNTER — Ambulatory Visit: Payer: Medicare HMO | Admitting: Physical Therapy

## 2023-10-15 ENCOUNTER — Ambulatory Visit (INDEPENDENT_AMBULATORY_CARE_PROVIDER_SITE_OTHER): Admitting: Plastic Surgery

## 2023-10-15 ENCOUNTER — Encounter: Payer: Self-pay | Admitting: Plastic Surgery

## 2023-10-15 VITALS — BP 167/85 | HR 72

## 2023-10-15 DIAGNOSIS — C50512 Malignant neoplasm of lower-outer quadrant of left female breast: Secondary | ICD-10-CM

## 2023-10-15 DIAGNOSIS — Z9013 Acquired absence of bilateral breasts and nipples: Secondary | ICD-10-CM

## 2023-10-15 DIAGNOSIS — S21001A Unspecified open wound of right breast, initial encounter: Secondary | ICD-10-CM | POA: Diagnosis not present

## 2023-10-15 DIAGNOSIS — Z170 Estrogen receptor positive status [ER+]: Secondary | ICD-10-CM

## 2023-10-15 NOTE — Progress Notes (Addendum)
 The patient is a 73 year old female here for follow-up after surgery.  She originally had breast cancer back in 2014 and underwent bilateral mastectomies with implant placement.  She then decided to go ahead and have the implants removed.  According to the operative note they were [employer] 780 cc implants.  So they were removed in the past month.  Unfortunately since then she has had some trouble with the healing on the right side. The wound is 1 x 2 x 0.5 cm. She has been using the Vashe.  It seems to be getting much better but I think she is going to need a revision in order to improve the contour.  Patient is going to think it over and let us know.  She wanted me to see if I could drain it today but nothing came out I think it is just the way that it is scarring down.

## 2023-10-15 NOTE — Telephone Encounter (Signed)
 Fax sent to [facility] regarding wound supplies. Confirmation of receipt.

## 2023-10-18 DIAGNOSIS — L2989 Other pruritus: Secondary | ICD-10-CM | POA: Diagnosis not present

## 2023-10-18 DIAGNOSIS — D225 Melanocytic nevi of trunk: Secondary | ICD-10-CM | POA: Diagnosis not present

## 2023-10-18 DIAGNOSIS — D485 Neoplasm of uncertain behavior of skin: Secondary | ICD-10-CM | POA: Diagnosis not present

## 2023-10-18 DIAGNOSIS — L538 Other specified erythematous conditions: Secondary | ICD-10-CM | POA: Diagnosis not present

## 2023-10-18 DIAGNOSIS — Z85828 Personal history of other malignant neoplasm of skin: Secondary | ICD-10-CM | POA: Diagnosis not present

## 2023-10-18 DIAGNOSIS — D235 Other benign neoplasm of skin of trunk: Secondary | ICD-10-CM | POA: Diagnosis not present

## 2023-10-18 DIAGNOSIS — D2262 Melanocytic nevi of left upper limb, including shoulder: Secondary | ICD-10-CM | POA: Diagnosis not present

## 2023-10-18 DIAGNOSIS — L820 Inflamed seborrheic keratosis: Secondary | ICD-10-CM | POA: Diagnosis not present

## 2023-10-18 DIAGNOSIS — D2261 Melanocytic nevi of right upper limb, including shoulder: Secondary | ICD-10-CM | POA: Diagnosis not present

## 2023-10-18 DIAGNOSIS — D2272 Melanocytic nevi of left lower limb, including hip: Secondary | ICD-10-CM | POA: Diagnosis not present

## 2023-10-21 ENCOUNTER — Telehealth: Payer: Self-pay | Admitting: Plastic Surgery

## 2023-10-21 NOTE — Telephone Encounter (Signed)
 Pt called asking when her second sx wound be she said it was to fix the first sx. I do not see a new order for sx, please advise

## 2023-10-22 NOTE — Addendum Note (Signed)
 Addended by: Peggye Form on: 10/22/2023 12:16 PM   Modules accepted: Orders

## 2023-10-23 ENCOUNTER — Encounter: Admitting: Surgical

## 2023-10-23 ENCOUNTER — Ambulatory Visit (INDEPENDENT_AMBULATORY_CARE_PROVIDER_SITE_OTHER): Admitting: Surgical

## 2023-10-23 DIAGNOSIS — Z170 Estrogen receptor positive status [ER+]: Secondary | ICD-10-CM

## 2023-10-23 DIAGNOSIS — C50512 Malignant neoplasm of lower-outer quadrant of left female breast: Secondary | ICD-10-CM

## 2023-10-23 DIAGNOSIS — Z9013 Acquired absence of bilateral breasts and nipples: Secondary | ICD-10-CM

## 2023-10-23 DIAGNOSIS — Z9889 Other specified postprocedural states: Secondary | ICD-10-CM

## 2023-10-23 NOTE — Progress Notes (Signed)
 Patient is a very pleasant 73 year old female here for follow-up after removal of bilateral breast implants.  She has a history of bilateral mastectomy.  Postoperatively she had a fluid collection of the right breast, underwent serial aspirations, had Staph aureus which was pansensitive.  Was placed on Bactrim for 14 days.  This area has significantly improved.  She does have wounds of the right breast which we have been managing with Vaseline and gauze dressing changes.  She has noticed improvement, but would like to undergo surgical intervention for reexcision and closure of the wounds if possible.  She had an appointment with Dr. Ulice Bold on 10/15/2023.  Per chart review, route was placed to plan for excision of wound with closure in the operating room.  Possible liposuction.  Chaperone present on exam On exam right breast wounds are present, she has 2 wounds, 1 medially that is pinpoint and has some fat herniating through it.  She also has 1 lateral that is approximately 5 x 5 mm.  There is no surrounding erythema or cellulitic changes.  She does have some irritation from the drainage/moisture of the wound.  There is no subcutaneous fluid collection noted.  No purulent drainage noted.  Minimal tenderness noted.    A/P:  Recommend continuing Vaseline and gauze dressing changes.  Recommend using 4 x 4 gauze and securing with tape to assist with drainage collection.  She has been using Mepilex border dressings, but I think this may be retaining too much moisture.  There is no signs infection or concern on exam.  Recommend closely monitoring the area, we will have her follow-up in 2 weeks.  Discussed with patient that Dr. Ulice Bold placed a route for her to return to the OR for additional surgery for excision of the wounds and closure.  Discussed with her that our surgical schedulers would reach out once confirmation from insurance has been received.  Pictures were obtained of the patient and  placed in the chart with the patient's or guardian's permission.

## 2023-10-24 DIAGNOSIS — M65331 Trigger finger, right middle finger: Secondary | ICD-10-CM | POA: Diagnosis not present

## 2023-10-24 DIAGNOSIS — M65341 Trigger finger, right ring finger: Secondary | ICD-10-CM | POA: Diagnosis not present

## 2023-10-25 ENCOUNTER — Encounter: Admitting: Surgical

## 2023-10-29 ENCOUNTER — Encounter: Admitting: Plastic Surgery

## 2023-11-01 ENCOUNTER — Other Ambulatory Visit: Payer: Self-pay

## 2023-11-01 ENCOUNTER — Telehealth: Payer: Self-pay

## 2023-11-01 ENCOUNTER — Other Ambulatory Visit (HOSPITAL_COMMUNITY): Payer: Self-pay

## 2023-11-01 ENCOUNTER — Ambulatory Visit: Admitting: Student

## 2023-11-01 ENCOUNTER — Encounter: Payer: Self-pay | Admitting: Student

## 2023-11-01 VITALS — BP 185/91 | HR 74 | Ht 63.0 in | Wt 167.6 lb

## 2023-11-01 DIAGNOSIS — C50512 Malignant neoplasm of lower-outer quadrant of left female breast: Secondary | ICD-10-CM

## 2023-11-01 DIAGNOSIS — D225 Melanocytic nevi of trunk: Secondary | ICD-10-CM | POA: Diagnosis not present

## 2023-11-01 DIAGNOSIS — S21001A Unspecified open wound of right breast, initial encounter: Secondary | ICD-10-CM | POA: Diagnosis not present

## 2023-11-01 DIAGNOSIS — L905 Scar conditions and fibrosis of skin: Secondary | ICD-10-CM | POA: Diagnosis not present

## 2023-11-01 DIAGNOSIS — Z170 Estrogen receptor positive status [ER+]: Secondary | ICD-10-CM

## 2023-11-01 MED ORDER — OXYCODONE HCL 5 MG PO TABS
5.0000 mg | ORAL_TABLET | Freq: Four times a day (QID) | ORAL | 0 refills | Status: DC | PRN
  Filled 2023-11-01: qty 10, 3d supply, fill #0

## 2023-11-01 MED ORDER — CEPHALEXIN 500 MG PO CAPS
500.0000 mg | ORAL_CAPSULE | Freq: Four times a day (QID) | ORAL | 0 refills | Status: AC
  Filled 2023-11-01: qty 12, 3d supply, fill #0

## 2023-11-01 NOTE — Telephone Encounter (Signed)
 Faxed [facility] order for calcium alginate dressing and silicone bordered foam-change daily.

## 2023-11-01 NOTE — H&P (View-Only) (Signed)
 Patient ID: Krystal [name], female    DOB: August 25, 1950, 73 y.o.   MRN: [id number]  Chief Complaint  Patient presents with   Pre-op Exam      ICD-10-CM   1. Malignant neoplasm of lower-outer quadrant of left breast of female, estrogen receptor positive (HCC)  C50.512    Z17.0        History of Present Illness: Krystal [name] is a 73 y.o.  female  with a history of breast cancer status post breast reconstruction.  She presents for preoperative evaluation for upcoming procedure, revision of right breast reconstruction with excision of scar and capsular contracture and possible liposuction, scheduled for 11/18/2023 with Dr. Ulice Bold.  The patient has not had problems with anesthesia.  Patient reports that she had a cardiac ablation prior to her last surgery.  She states that she is still taking Eliquis.  She denies any changes in her health or cardiac disease.  She denies any new chest pain or shortness of breath.  Patient reports she is not a smoker.  Patient denies taking any hormone replacement.  She denies any history of miscarriages.  She does reports she had a personal history of DVT back in the 70s, she was on OCP at the time.  She denies any known family history of blood clots or clotting diseases.  She reports that she had surgery 1 week ago for right trigger finger.  She denies any other recent infections, traumas, major medical events.  She denies any history of stroke or heart attack.  She denies any history of Crohn's disease or ulcerative colitis.  She denies any history of COPD or asthma.  She does have history of varicosities and swollen legs.  She denies any recent fevers, chills or changes in her health.  Patient states that she is active and did not have to take Lovenox after her last surgery.  Patient also states that she saw her PCP prior to her previous surgery and her PCP was okay with her moving forward with surgery.  Patient also states today that her wound  continues to drain.  She was requesting more dressings for her wound.  Summary of Previous Visit: Patient was seen by Dr. Ulice Bold on 07/19/2023.  At this visit, it was noted that patient was diagnosed with left breast cancer.  She underwent bilateral mastectomies in 2012 and also underwent adjuvant chemotherapy.  In February 2014, she underwent bilateral expander placement with ADM.  In July 2014 she had removal of bilateral expanders and placement of implants.  Patient had Mentor 480 cc implants placed bilaterally.  In November 2014 she had a right capsulotomy with repositioning of the implant and liposuction.  The patient reported that instead of getting new implants she just wants to have the current implants removed and have a flap closure.  It was noted that patient recently had cardiac surgery with stent placement.  Plan was to move forward for bilateral implant removal with flap closure.  She underwent this procedure with Dr. Ulice Bold on 08/26/2023.  Patient was seen in the clinic by Dr. Ulice Bold on 10/15/2023.  At this visit, it was noted that patient had some trouble healing on the right side after her surgery in February.  She had a wound that was 1 x 2 x 0.5 cm.  It was noted that patient would most likely need a revision in order to improve the contour.  Patient then seen again again in the clinic on 10/23/2023.  At this visit, right breast wounds were present.  There were 2 wounds, one medially that was pinpoint and had some fat herniating through it and one that was lateral that is approximately 5 x 5 mm.  It was recommended that she continue with Vaseline dressing changes.  There were no signs of infection on exam.  Job: Retired  PMH Significant for: Hypertension, A-fib with RVR, Raynaud's, GERD, arthritis, breast cancer, depression, OSA, uses CPAP machine  Per chart review, patient had an appointment with cardiology provider, Sherie Don, NP before her surgery in February on  08/19/2023.  Per Sherie Don NP, "RCRI equals 0, indicating a 0.4% risk of perioperative major cardiac event, no additional testing or workup needed, has already received clearance to hold Eliquis 2 days prior to procedure"  Patient's blood pressure is elevated in clinic today.  She states that she did not take her morning medications and was stressed with the driving on the way here.  She denies any headaches, changes in her vision, chest pain or shortness of breath.  We did retake her blood pressure and it came down a little bit.  I recommended that she take her blood pressure when she gets home.  She does states she has a blood pressure cuff at home.  I told her that if it is still elevated at home, she should reach out to her primary care provider.  Patient expressed understanding.   Past Medical History: Allergies: Allergies  Allergen Reactions   Cephalexin Nausea Only    Diaphoresis / Sweating (intolerance)   Levaquin [Levofloxacin] Other (See Comments)    Stiff joints, unable to walk.    Current Medications:  Current Outpatient Medications:    acetaminophen-codeine (TYLENOL #3) 300-30 MG tablet, Take 1 tablet by mouth 3 (three) times daily as needed., Disp: , Rfl:    alendronate (FOSAMAX) 70 MG tablet, Take 70 mg by mouth once a week. Take with a full glass of water on an empty stomach., Disp: , Rfl:    apixaban (ELIQUIS) 5 MG TABS tablet, Take 1 tablet (5 mg total) by mouth 2 (two) times daily., Disp: 60 tablet, Rfl: 5   cetirizine (ZYRTEC) 10 MG tablet, Take 10 mg by mouth daily., Disp: , Rfl:    cholecalciferol (VITAMIN D3) 25 MCG (1000 UNIT) tablet, Take 1,000 Units by mouth daily., Disp: , Rfl:    diltiazem (CARDIZEM CD) 120 MG 24 hr capsule, Take 1 capsule (120 mg total) by mouth in the morning and at bedtime., Disp: 180 capsule, Rfl: 0   DULoxetine (CYMBALTA) 60 MG capsule, Take 60 mg by mouth daily., Disp: , Rfl:    fluticasone (FLONASE) 50 MCG/ACT nasal spray, Place 2 sprays  into both nostrils daily., Disp: , Rfl:    folic acid (FOLVITE) 1 MG tablet, Take 1 mg by mouth daily., Disp: , Rfl:    furosemide (LASIX) 20 MG tablet, Take 20 mg by mouth 2 (two) times daily., Disp: , Rfl:    levocetirizine (XYZAL) 5 MG tablet, Take 5 mg by mouth every evening., Disp: , Rfl:    LYSINE ACETATE PO, Take 1,000 mg by mouth daily as needed (fever blister)., Disp: , Rfl:    magnesium oxide (MAG-OX) 400 (240 Mg) MG tablet, Take 400 mg by mouth daily as needed (low magnesium)., Disp: , Rfl:    meloxicam (MOBIC) 7.5 MG tablet, 7.5 mg by oral route., Disp: , Rfl:    montelukast (SINGULAIR) 10 MG tablet, Take 1 tablet by mouth at  bedtime., Disp: , Rfl:    omeprazole (PRILOSEC) 20 MG capsule, Take 20 mg by mouth daily., Disp: , Rfl:    ondansetron (ZOFRAN) 4 MG tablet, Take 1 tablet (4 mg total) by mouth every 8 (eight) hours as needed for nausea or vomiting., Disp: 20 tablet, Rfl: 0   oxybutynin (DITROPAN-XL) 10 MG 24 hr tablet, Take 1 tablet (10 mg total) by mouth at bedtime., Disp: 30 tablet, Rfl: 1   potassium chloride SA (KLOR-CON M) 20 MEQ tablet, Take 1 tablet (20 mEq total) by mouth 2 (two) times daily., Disp: 180 tablet, Rfl: 3   pregabalin (LYRICA) 300 MG capsule, Take 300 mg by mouth 2 (two) times daily., Disp: , Rfl:    rOPINIRole (REQUIP) 1 MG tablet, Take 1 mg by mouth at bedtime., Disp: , Rfl:    triamcinolone (KENALOG) 0.025 % cream, Apply 1 Application topically as needed (flare - up)., Disp: , Rfl:    Vibegron (GEMTESA) 75 MG TABS, Take 1 tablet (75 mg total) by mouth daily., Disp: 90 tablet, Rfl: 3   Vitamin D, Ergocalciferol, (DRISDOL) 1.25 MG (50000 UNIT) CAPS capsule, Take 50,000 Units by mouth every 7 (seven) days., Disp: , Rfl:    zolpidem (AMBIEN) 10 MG tablet, Take 10 mg by mouth at bedtime., Disp: , Rfl:    pantoprazole (PROTONIX) 40 MG tablet, Take 1 tablet (40 mg total) by mouth daily., Disp: 45 tablet, Rfl: 0  Past Medical Problems: Past Medical History:   Diagnosis Date   Anemia    Arthritis    osteo   Blindness of right eye at birth   Cancer [facility]) 2011   Left breast 2011 and cervical age 50   Carpal tunnel syndrome    Cataract    Depression    Edema 07/27/2011   Fibromyalgia    muscle weakness and pain   GERD (gastroesophageal reflux disease)    Hyperlipidemia    Hypertension    benign   Lymphedema    Osteoporosis    Plantar fasciitis    Pre-diabetes    Raynaud's disease    Restless leg syndrome    Rosacea    Rotator cuff rupture    Sleep apnea    Synovitis    Ulcer     Past Surgical History: Past Surgical History:  Procedure Laterality Date   ABDOMINAL HYSTERECTOMY  1986   For bleeding and pain   ATRIAL FIBRILLATION ABLATION N/A 05/14/2023   Procedure: ATRIAL FIBRILLATION ABLATION;  Surgeon: Lanier Prude, MD;  Location: [facility];  Service: Cardiovascular;  Laterality: N/A;   BLADDER SURGERY  2006   Bladder tack   BREAST RECONSTRUCTION Bilateral 09/17/2012   Procedure: BREAST RECONSTRUCTION;  Surgeon: Wayland Denis, DO;  Location: [facility];  Service: Plastics;  Laterality: Bilateral;  BILATERAL BREAST RECONSTRUCTION WITH TISSUE EXPANDERS AND ALLOMED   BREAST RECONSTRUCTION Right 06/03/2013   Procedure: RIGHT BREAST CAPSULE CONSTRACTURE;  Surgeon: Wayland Denis, DO;  Location: [facility];  Service: Plastics;  Laterality: Right;   CARDIOVERSION N/A 08/06/2022   Procedure: CARDIOVERSION;  Surgeon: Iran Ouch, MD;  Location: [facility];  Service: Cardiovascular;  Laterality: N/A;   CARDIOVERSION N/A 10/02/2022   Procedure: CARDIOVERSION;  Surgeon: Yvonne Kendall, MD;  Location: [facility];  Service: Cardiovascular;  Laterality: N/A;   CARPAL TUNNEL RELEASE Bilateral 2008   EYE SURGERY Right    INJECTION KNEE Left 08/12/2018   Procedure: KNEE INJECTION-LEFT;  Surgeon: Christena Flake, MD;  Location: [facility];  Service: Orthopedics;  Laterality: Left;   JOINT REPLACEMENT      LIPOSUCTION Bilateral 01/29/2013   Procedure: LIPOSUCTION;  Surgeon: Wayland Denis, DO;  Location: [facility];  Service: Plastics;  Laterality: Bilateral;   LIPOSUCTION Right 06/03/2013   Procedure: LIPOSUCTION;  Surgeon: Wayland Denis, DO;  Location: [facility];  Service: Plastics;  Laterality: Right;   MASTECTOMY  2012   rt prophalactic mast-snbx   MASTECTOMY MODIFIED RADICAL  2012   left-axillary nodes   NEUROMA SURGERY Bilateral    NOSE SURGERY  2007   PORT-A-CATH REMOVAL     insertion and   THROAT SURGERY     TONSILLECTOMY     TOTAL KNEE ARTHROPLASTY Right 08/12/2018   Procedure: TOTAL KNEE ARTHROPLASTY-RIGHT;  Surgeon: Christena Flake, MD;  Location: [facility];  Service: Orthopedics;  Laterality: Right;   TOTAL KNEE ARTHROPLASTY Left 09/22/2019   Procedure: TOTAL KNEE ARTHROPLASTY;  Surgeon: Christena Flake, MD;  Location: [facility];  Service: Orthopedics;  Laterality: Left;   UVULOPALATOPHARYNGOPLASTY      Social History: Social History   Socioeconomic History   Marital status: Married    Spouse name: Not on file   Number of children: Not on file   Years of education: Not on file   Highest education level: Not on file  Occupational History   Not on file  Tobacco Use   Smoking status: Never    Passive exposure: Never   Smokeless tobacco: Never  Vaping Use   Vaping status: Never Used  Substance and Sexual Activity   Alcohol use: Not Currently   Drug use: No   Sexual activity: Yes  Other Topics Concern   Not on file  Social History Narrative   Not on file   Social Drivers of Health   Financial Resource Strain: Patient Declined (08/28/2023)   Received from [facility]   Overall Financial Resource Strain (CARDIA)    Difficulty of Paying Living Expenses: Patient declined  Food Insecurity: Patient Declined (08/28/2023)   Received from [facility]   Hunger Vital Sign    Worried About Running Out of Food in the  Last Year: Patient declined    Ran Out of Food in the Last Year: Patient declined  Transportation Needs: Patient Declined (08/28/2023)   Received from [facility]   PRAPARE - Transportation    In the past 12 months, has lack of transportation kept you from medical appointments or from getting medications?: Patient declined    Lack of Transportation (Non-Medical): Patient declined  Physical Activity: Not on file  Stress: Not on file  Social Connections: Not on file  Intimate Partner Violence: Not on file    Family History: Family History  Problem Relation Age of Onset   Cancer Mother 19       non hodgkins lymphoma   Cancer Father        neck, throat, lung   Lung cancer Father    Throat cancer Father    Hypertension Sister    Cancer Sister 3       Breast cancer dx'd 04/2011   Hypertension Brother    Breast cancer Maternal Aunt    Liver cancer Maternal Uncle    Cancer Maternal Grandmother        died 47, unknown cancer   Cancer Maternal Grandfather        died in his 65s; unknown cancer   Lung cancer  Maternal Uncle    Throat cancer Maternal Uncle    Breast cancer Maternal Aunt    Cancer Cousin        died under the age 25, unknown cancer   Cancer Cousin        died age 60; unknown cancer    Review of Systems: Denies any recent fevers, chills or changes in her health  Physical Exam: Vital Signs BP (!) 185/91 (BP Location: Left Arm, Patient Position: Sitting, Cuff Size: Normal)   Pulse 74   Ht 5\' 3"  (1.6 m)   Wt 167 lb 9.6 oz (76 kg)   SpO2 97%   BMI 29.69 kg/m   Physical Exam  Constitutional:      General: Not in acute distress.    Appearance: Normal appearance. Not ill-appearing.  HENT:     Head: Normocephalic and atraumatic.  Eyes:     Pupils: Pupils are equal, round Neck:     Musculoskeletal: Normal range of motion.  Cardiovascular:     Rate and Rhythm: Normal rate    Pulses: Normal pulses.  Pulmonary:     Effort: Pulmonary effort is  normal. No respiratory distress.  Abdominal:     General: Abdomen is flat. There is no distension.  Musculoskeletal: Normal range of motion.  Skin:    General: Skin is warm and dry.     Findings: Wound to the right breast is approximately 0.5 x 0.5 cm in size.  There is no active drainage noted on exam.  There was some mild irritation noted around the wound.  No cellulitic changes or significant swelling noted. Neurological:     General: No focal deficit present.     Mental Status: Alert and oriented to person, place, and time. Mental status is at baseline.     Motor: No weakness.  Psychiatric:        Mood and Affect: Mood normal.        Behavior: Behavior normal.    Assessment/Plan: The patient is scheduled for revision of right breast reconstruction with excision of scar and capsular contracture and possible liposuction with Dr. Ulice Bold.  Risks, benefits, and alternatives of procedure discussed, questions answered and consent obtained.    Recommended that patient apply calcium alginate and a dressing over her wound 1-2 times daily.  Will send prism order for supplies.  Patient was in agreement with this plan.  Smoking Status: Non-smoker; Counseling Given?  N/A Last Mammogram: N/A, status post mastectomies  Caprini Score: 13; Risk Factors include: Age, history of malignancy, history of DVT, recent surgery, varicose veins, swollen legs, BMI greater than 25, and length of planned surgery. Recommendation for mechanical and possible pharmacological prophylaxis. Encourage early ambulation.  Patient is currently on Eliquis and will plan to start it 24 hours after surgery.  She states that she did not need supplemental Lovenox for her previous surgery.  Will check with Dr. Ulice Bold in regards to postoperative Lovenox for upcoming surgery.  Pictures obtained: 10/23/23  Post-op Rx sent to pharmacy:  Keflex, oxycodone -patient states that she already has Zofran at home.  She states that she  took the oxycodone for her surgery before with this and did okay with it.  Patient denies any allergies to penicillin.  Patient understands that I will send these medications in today, but she is to take them after surgery.  Discussed with her to take Tylenol and ibuprofen for her pain and only take the oxycodone if needed.  Patient expressed understanding.  Patient  was provided with the General Surgical Risk consent document and Pain Medication Agreement prior to their appointment.  They had adequate time to read through the risk consent documents and Pain Medication Agreement. We also discussed them in person together during this preop appointment. All of their questions were answered to their satisfaction.  Recommended calling if they have any further questions.  Risk consent form and Pain Medication Agreement to be scanned into patient's chart.  Discussed with patient that she may be at increased risk of perioperative complications and complications with anesthesia given her history of OSA and her cardiac history.  Patient expressed understanding.   Electronically signed by: Laurena Spies, PA-C 11/01/2023 9:34 AM

## 2023-11-01 NOTE — Progress Notes (Signed)
 Patient ID: [name], female    DOB: August 25, 1950, 73 y.o.   MRN: [id number]  Chief Complaint  Patient presents with   Pre-op Exam      ICD-10-CM   1. Malignant neoplasm of lower-outer quadrant of left breast of female, estrogen receptor positive (HCC)  C50.512    Z17.0        History of Present Illness: Krystal [name] is a 73 y.o.  female  with a history of breast cancer status post breast reconstruction.  She presents for preoperative evaluation for upcoming procedure, revision of right breast reconstruction with excision of scar and capsular contracture and possible liposuction, scheduled for 11/18/2023 with Dr. Ulice Bold.  The patient has not had problems with anesthesia.  Patient reports that she had a cardiac ablation prior to her last surgery.  She states that she is still taking Eliquis.  She denies any changes in her health or cardiac disease.  She denies any new chest pain or shortness of breath.  Patient reports she is not a smoker.  Patient denies taking any hormone replacement.  She denies any history of miscarriages.  She does reports she had a personal history of DVT back in the 70s, she was on OCP at the time.  She denies any known family history of blood clots or clotting diseases.  She reports that she had surgery 1 week ago for right trigger finger.  She denies any other recent infections, traumas, major medical events.  She denies any history of stroke or heart attack.  She denies any history of Crohn's disease or ulcerative colitis.  She denies any history of COPD or asthma.  She does have history of varicosities and swollen legs.  She denies any recent fevers, chills or changes in her health.  Patient states that she is active and did not have to take Lovenox after her last surgery.  Patient also states that she saw her PCP prior to her previous surgery and her PCP was okay with her moving forward with surgery.  Patient also states today that her wound  continues to drain.  She was requesting more dressings for her wound.  Summary of Previous Visit: Patient was seen by Dr. Ulice Bold on 07/19/2023.  At this visit, it was noted that patient was diagnosed with left breast cancer.  She underwent bilateral mastectomies in 2012 and also underwent adjuvant chemotherapy.  In February 2014, she underwent bilateral expander placement with ADM.  In July 2014 she had removal of bilateral expanders and placement of implants.  Patient had Mentor 480 cc implants placed bilaterally.  In November 2014 she had a right capsulotomy with repositioning of the implant and liposuction.  The patient reported that instead of getting new implants she just wants to have the current implants removed and have a flap closure.  It was noted that patient recently had cardiac surgery with stent placement.  Plan was to move forward for bilateral implant removal with flap closure.  She underwent this procedure with Dr. Ulice Bold on 08/26/2023.  Patient was seen in the clinic by Dr. Ulice Bold on 10/15/2023.  At this visit, it was noted that patient had some trouble healing on the right side after her surgery in February.  She had a wound that was 1 x 2 x 0.5 cm.  It was noted that patient would most likely need a revision in order to improve the contour.  Patient then seen again again in the clinic on 10/23/2023.  At this visit, right breast wounds were present.  There were 2 wounds, one medially that was pinpoint and had some fat herniating through it and one that was lateral that is approximately 5 x 5 mm.  It was recommended that she continue with Vaseline dressing changes.  There were no signs of infection on exam.  Job: Retired  PMH Significant for: Hypertension, A-fib with RVR, Raynaud's, GERD, arthritis, breast cancer, depression, OSA, uses CPAP machine  Per chart review, patient had an appointment with cardiology provider, Sherie Don, NP before her surgery in February on  08/19/2023.  Per Sherie Don NP, "RCRI equals 0, indicating a 0.4% risk of perioperative major cardiac event, no additional testing or workup needed, has already received clearance to hold Eliquis 2 days prior to procedure"  Patient's blood pressure is elevated in clinic today.  She states that she did not take her morning medications and was stressed with the driving on the way here.  She denies any headaches, changes in her vision, chest pain or shortness of breath.  We did retake her blood pressure and it came down a little bit.  I recommended that she take her blood pressure when she gets home.  She does states she has a blood pressure cuff at home.  I told her that if it is still elevated at home, she should reach out to her primary care provider.  Patient expressed understanding.   Past Medical History: Allergies: Allergies  Allergen Reactions   Cephalexin Nausea Only    Diaphoresis / Sweating (intolerance)   Levaquin [Levofloxacin] Other (See Comments)    Stiff joints, unable to walk.    Current Medications:  Current Outpatient Medications:    acetaminophen-codeine (TYLENOL #3) 300-30 MG tablet, Take 1 tablet by mouth 3 (three) times daily as needed., Disp: , Rfl:    alendronate (FOSAMAX) 70 MG tablet, Take 70 mg by mouth once a week. Take with a full glass of water on an empty stomach., Disp: , Rfl:    apixaban (ELIQUIS) 5 MG TABS tablet, Take 1 tablet (5 mg total) by mouth 2 (two) times daily., Disp: 60 tablet, Rfl: 5   cetirizine (ZYRTEC) 10 MG tablet, Take 10 mg by mouth daily., Disp: , Rfl:    cholecalciferol (VITAMIN D3) 25 MCG (1000 UNIT) tablet, Take 1,000 Units by mouth daily., Disp: , Rfl:    diltiazem (CARDIZEM CD) 120 MG 24 hr capsule, Take 1 capsule (120 mg total) by mouth in the morning and at bedtime., Disp: 180 capsule, Rfl: 0   DULoxetine (CYMBALTA) 60 MG capsule, Take 60 mg by mouth daily., Disp: , Rfl:    fluticasone (FLONASE) 50 MCG/ACT nasal spray, Place 2 sprays  into both nostrils daily., Disp: , Rfl:    folic acid (FOLVITE) 1 MG tablet, Take 1 mg by mouth daily., Disp: , Rfl:    furosemide (LASIX) 20 MG tablet, Take 20 mg by mouth 2 (two) times daily., Disp: , Rfl:    levocetirizine (XYZAL) 5 MG tablet, Take 5 mg by mouth every evening., Disp: , Rfl:    LYSINE ACETATE PO, Take 1,000 mg by mouth daily as needed (fever blister)., Disp: , Rfl:    magnesium oxide (MAG-OX) 400 (240 Mg) MG tablet, Take 400 mg by mouth daily as needed (low magnesium)., Disp: , Rfl:    meloxicam (MOBIC) 7.5 MG tablet, 7.5 mg by oral route., Disp: , Rfl:    montelukast (SINGULAIR) 10 MG tablet, Take 1 tablet by mouth at  bedtime., Disp: , Rfl:    omeprazole (PRILOSEC) 20 MG capsule, Take 20 mg by mouth daily., Disp: , Rfl:    ondansetron (ZOFRAN) 4 MG tablet, Take 1 tablet (4 mg total) by mouth every 8 (eight) hours as needed for nausea or vomiting., Disp: 20 tablet, Rfl: 0   oxybutynin (DITROPAN-XL) 10 MG 24 hr tablet, Take 1 tablet (10 mg total) by mouth at bedtime., Disp: 30 tablet, Rfl: 1   potassium chloride SA (KLOR-CON M) 20 MEQ tablet, Take 1 tablet (20 mEq total) by mouth 2 (two) times daily., Disp: 180 tablet, Rfl: 3   pregabalin (LYRICA) 300 MG capsule, Take 300 mg by mouth 2 (two) times daily., Disp: , Rfl:    rOPINIRole (REQUIP) 1 MG tablet, Take 1 mg by mouth at bedtime., Disp: , Rfl:    triamcinolone (KENALOG) 0.025 % cream, Apply 1 Application topically as needed (flare - up)., Disp: , Rfl:    Vibegron (GEMTESA) 75 MG TABS, Take 1 tablet (75 mg total) by mouth daily., Disp: 90 tablet, Rfl: 3   Vitamin D, Ergocalciferol, (DRISDOL) 1.25 MG (50000 UNIT) CAPS capsule, Take 50,000 Units by mouth every 7 (seven) days., Disp: , Rfl:    zolpidem (AMBIEN) 10 MG tablet, Take 10 mg by mouth at bedtime., Disp: , Rfl:    pantoprazole (PROTONIX) 40 MG tablet, Take 1 tablet (40 mg total) by mouth daily., Disp: 45 tablet, Rfl: 0  Past Medical Problems: Past Medical History:   Diagnosis Date   Anemia    Arthritis    osteo   Blindness of right eye at birth   Cancer [facility]) 2011   Left breast 2011 and cervical age 50   Carpal tunnel syndrome    Cataract    Depression    Edema 07/27/2011   Fibromyalgia    muscle weakness and pain   GERD (gastroesophageal reflux disease)    Hyperlipidemia    Hypertension    benign   Lymphedema    Osteoporosis    Plantar fasciitis    Pre-diabetes    Raynaud's disease    Restless leg syndrome    Rosacea    Rotator cuff rupture    Sleep apnea    Synovitis    Ulcer     Past Surgical History: Past Surgical History:  Procedure Laterality Date   ABDOMINAL HYSTERECTOMY  1986   For bleeding and pain   ATRIAL FIBRILLATION ABLATION N/A 05/14/2023   Procedure: ATRIAL FIBRILLATION ABLATION;  Surgeon: Lanier Prude, MD;  Location: [facility];  Service: Cardiovascular;  Laterality: N/A;   BLADDER SURGERY  2006   Bladder tack   BREAST RECONSTRUCTION Bilateral 09/17/2012   Procedure: BREAST RECONSTRUCTION;  Surgeon: Wayland Denis, DO;  Location: [facility];  Service: Plastics;  Laterality: Bilateral;  BILATERAL BREAST RECONSTRUCTION WITH TISSUE EXPANDERS AND ALLOMED   BREAST RECONSTRUCTION Right 06/03/2013   Procedure: RIGHT BREAST CAPSULE CONSTRACTURE;  Surgeon: Wayland Denis, DO;  Location: [facility];  Service: Plastics;  Laterality: Right;   CARDIOVERSION N/A 08/06/2022   Procedure: CARDIOVERSION;  Surgeon: Iran Ouch, MD;  Location: [facility];  Service: Cardiovascular;  Laterality: N/A;   CARDIOVERSION N/A 10/02/2022   Procedure: CARDIOVERSION;  Surgeon: Yvonne Kendall, MD;  Location: [facility];  Service: Cardiovascular;  Laterality: N/A;   CARPAL TUNNEL RELEASE Bilateral 2008   EYE SURGERY Right    INJECTION KNEE Left 08/12/2018   Procedure: KNEE INJECTION-LEFT;  Surgeon: Christena Flake, MD;  Location: [facility];  Service: Orthopedics;  Laterality: Left;   JOINT REPLACEMENT      LIPOSUCTION Bilateral 01/29/2013   Procedure: LIPOSUCTION;  Surgeon: Wayland Denis, DO;  Location: [facility];  Service: Plastics;  Laterality: Bilateral;   LIPOSUCTION Right 06/03/2013   Procedure: LIPOSUCTION;  Surgeon: Wayland Denis, DO;  Location: [facility];  Service: Plastics;  Laterality: Right;   MASTECTOMY  2012   rt prophalactic mast-snbx   MASTECTOMY MODIFIED RADICAL  2012   left-axillary nodes   NEUROMA SURGERY Bilateral    NOSE SURGERY  2007   PORT-A-CATH REMOVAL     insertion and   THROAT SURGERY     TONSILLECTOMY     TOTAL KNEE ARTHROPLASTY Right 08/12/2018   Procedure: TOTAL KNEE ARTHROPLASTY-RIGHT;  Surgeon: Christena Flake, MD;  Location: [facility];  Service: Orthopedics;  Laterality: Right;   TOTAL KNEE ARTHROPLASTY Left 09/22/2019   Procedure: TOTAL KNEE ARTHROPLASTY;  Surgeon: Christena Flake, MD;  Location: [facility];  Service: Orthopedics;  Laterality: Left;   UVULOPALATOPHARYNGOPLASTY      Social History: Social History   Socioeconomic History   Marital status: Married    Spouse name: Not on file   Number of children: Not on file   Years of education: Not on file   Highest education level: Not on file  Occupational History   Not on file  Tobacco Use   Smoking status: Never    Passive exposure: Never   Smokeless tobacco: Never  Vaping Use   Vaping status: Never Used  Substance and Sexual Activity   Alcohol use: Not Currently   Drug use: No   Sexual activity: Yes  Other Topics Concern   Not on file  Social History Narrative   Not on file   Social Drivers of Health   Financial Resource Strain: Patient Declined (08/28/2023)   Received from [facility]   Overall Financial Resource Strain (CARDIA)    Difficulty of Paying Living Expenses: Patient declined  Food Insecurity: Patient Declined (08/28/2023)   Received from [facility]   Hunger Vital Sign    Worried About Running Out of Food in the  Last Year: Patient declined    Ran Out of Food in the Last Year: Patient declined  Transportation Needs: Patient Declined (08/28/2023)   Received from [facility]   PRAPARE - Transportation    In the past 12 months, has lack of transportation kept you from medical appointments or from getting medications?: Patient declined    Lack of Transportation (Non-Medical): Patient declined  Physical Activity: Not on file  Stress: Not on file  Social Connections: Not on file  Intimate Partner Violence: Not on file    Family History: Family History  Problem Relation Age of Onset   Cancer Mother 19       non hodgkins lymphoma   Cancer Father        neck, throat, lung   Lung cancer Father    Throat cancer Father    Hypertension Sister    Cancer Sister 3       Breast cancer dx'd 04/2011   Hypertension Brother    Breast cancer Maternal Aunt    Liver cancer Maternal Uncle    Cancer Maternal Grandmother        died 47, unknown cancer   Cancer Maternal Grandfather        died in his 65s; unknown cancer   Lung cancer  Maternal Uncle    Throat cancer Maternal Uncle    Breast cancer Maternal Aunt    Cancer Cousin        died under the age 25, unknown cancer   Cancer Cousin        died age 60; unknown cancer    Review of Systems: Denies any recent fevers, chills or changes in her health  Physical Exam: Vital Signs BP (!) 185/91 (BP Location: Left Arm, Patient Position: Sitting, Cuff Size: Normal)   Pulse 74   Ht 5\' 3"  (1.6 m)   Wt 167 lb 9.6 oz (76 kg)   SpO2 97%   BMI 29.69 kg/m   Physical Exam  Constitutional:      General: Not in acute distress.    Appearance: Normal appearance. Not ill-appearing.  HENT:     Head: Normocephalic and atraumatic.  Eyes:     Pupils: Pupils are equal, round Neck:     Musculoskeletal: Normal range of motion.  Cardiovascular:     Rate and Rhythm: Normal rate    Pulses: Normal pulses.  Pulmonary:     Effort: Pulmonary effort is  normal. No respiratory distress.  Abdominal:     General: Abdomen is flat. There is no distension.  Musculoskeletal: Normal range of motion.  Skin:    General: Skin is warm and dry.     Findings: Wound to the right breast is approximately 0.5 x 0.5 cm in size.  There is no active drainage noted on exam.  There was some mild irritation noted around the wound.  No cellulitic changes or significant swelling noted. Neurological:     General: No focal deficit present.     Mental Status: Alert and oriented to person, place, and time. Mental status is at baseline.     Motor: No weakness.  Psychiatric:        Mood and Affect: Mood normal.        Behavior: Behavior normal.    Assessment/Plan: The patient is scheduled for revision of right breast reconstruction with excision of scar and capsular contracture and possible liposuction with Dr. Ulice Bold.  Risks, benefits, and alternatives of procedure discussed, questions answered and consent obtained.    Recommended that patient apply calcium alginate and a dressing over her wound 1-2 times daily.  Will send prism order for supplies.  Patient was in agreement with this plan.  Smoking Status: Non-smoker; Counseling Given?  N/A Last Mammogram: N/A, status post mastectomies  Caprini Score: 13; Risk Factors include: Age, history of malignancy, history of DVT, recent surgery, varicose veins, swollen legs, BMI greater than 25, and length of planned surgery. Recommendation for mechanical and possible pharmacological prophylaxis. Encourage early ambulation.  Patient is currently on Eliquis and will plan to start it 24 hours after surgery.  She states that she did not need supplemental Lovenox for her previous surgery.  Will check with Dr. Ulice Bold in regards to postoperative Lovenox for upcoming surgery.  Pictures obtained: 10/23/23  Post-op Rx sent to pharmacy:  Keflex, oxycodone -patient states that she already has Zofran at home.  She states that she  took the oxycodone for her surgery before with this and did okay with it.  Patient denies any allergies to penicillin.  Patient understands that I will send these medications in today, but she is to take them after surgery.  Discussed with her to take Tylenol and ibuprofen for her pain and only take the oxycodone if needed.  Patient expressed understanding.  Patient  was provided with the General Surgical Risk consent document and Pain Medication Agreement prior to their appointment.  They had adequate time to read through the risk consent documents and Pain Medication Agreement. We also discussed them in person together during this preop appointment. All of their questions were answered to their satisfaction.  Recommended calling if they have any further questions.  Risk consent form and Pain Medication Agreement to be scanned into patient's chart.  Discussed with patient that she may be at increased risk of perioperative complications and complications with anesthesia given her history of OSA and her cardiac history.  Patient expressed understanding.   Electronically signed by: Laurena Spies, PA-C 11/01/2023 9:34 AM

## 2023-11-05 ENCOUNTER — Telehealth: Payer: Self-pay | Admitting: Gastroenterology

## 2023-11-05 ENCOUNTER — Other Ambulatory Visit: Payer: Self-pay | Admitting: Internal Medicine

## 2023-11-05 NOTE — Telephone Encounter (Signed)
 The patient called to check to see if she had appointment today. I inform her that she does not have appointment with us .

## 2023-11-06 ENCOUNTER — Ambulatory Visit: Admitting: Surgical

## 2023-11-06 VITALS — BP 151/88 | HR 73

## 2023-11-06 DIAGNOSIS — Z170 Estrogen receptor positive status [ER+]: Secondary | ICD-10-CM

## 2023-11-06 DIAGNOSIS — Z9013 Acquired absence of bilateral breasts and nipples: Secondary | ICD-10-CM

## 2023-11-06 DIAGNOSIS — C50512 Malignant neoplasm of lower-outer quadrant of left female breast: Secondary | ICD-10-CM

## 2023-11-06 NOTE — H&P (View-Only) (Signed)
 Patient is a very pleasant 73 year old female here for follow-up on her right breast wound.  She is scheduled for revision, with excision of scar, contracture and possible liposuction with Dr. Orin Birk on 11/10/2023.  She is here for evaluation of the wound.  She has been keeping it covered and keeping it dry.  She reports some sharp shooting pain to the area, but otherwise is doing well.  Chaperone present on exam On exam medial wound has healed, the lateral wound is still open and is approximately 2 x 2 mm.  There is no surrounding erythema.  No active drainage noted.  There is some maceration noted of the wound edges.  There is also some irritation present.  There is no cellulitic changes.  No tenderness noted palpation.  I do not appreciate any foul odors.  A/P:  Recommend continue with Vaseline and gauze to right breast wound, discussed with patient no additional follow-up necessary until after surgery.  We did discuss that if she has any issues prior to surgery to please call the office and we can certainly see her sooner.  Recommend calling with questions or concerns.

## 2023-11-06 NOTE — Progress Notes (Signed)
 Patient is a very pleasant 73 year old female here for follow-up on her right breast wound.  She is scheduled for revision, with excision of scar, contracture and possible liposuction with Dr. Orin Birk on 11/10/2023.  She is here for evaluation of the wound.  She has been keeping it covered and keeping it dry.  She reports some sharp shooting pain to the area, but otherwise is doing well.  Chaperone present on exam On exam medial wound has healed, the lateral wound is still open and is approximately 2 x 2 mm.  There is no surrounding erythema.  No active drainage noted.  There is some maceration noted of the wound edges.  There is also some irritation present.  There is no cellulitic changes.  No tenderness noted palpation.  I do not appreciate any foul odors.  A/P:  Recommend continue with Vaseline and gauze to right breast wound, discussed with patient no additional follow-up necessary until after surgery.  We did discuss that if she has any issues prior to surgery to please call the office and we can certainly see her sooner.  Recommend calling with questions or concerns.

## 2023-11-08 DIAGNOSIS — M25641 Stiffness of right hand, not elsewhere classified: Secondary | ICD-10-CM | POA: Diagnosis not present

## 2023-11-11 ENCOUNTER — Encounter (HOSPITAL_BASED_OUTPATIENT_CLINIC_OR_DEPARTMENT_OTHER): Payer: Self-pay | Admitting: Plastic Surgery

## 2023-11-13 ENCOUNTER — Other Ambulatory Visit
Admission: RE | Admit: 2023-11-13 | Discharge: 2023-11-13 | Disposition: A | Discharge status: Home / Self Care | Source: Ambulatory Visit | Attending: Anesthesiology | Admitting: Anesthesiology

## 2023-11-13 DIAGNOSIS — Z79899 Other long term (current) drug therapy: Secondary | ICD-10-CM | POA: Insufficient documentation

## 2023-11-13 DIAGNOSIS — I4892 Unspecified atrial flutter: Secondary | ICD-10-CM | POA: Insufficient documentation

## 2023-11-13 DIAGNOSIS — I4819 Other persistent atrial fibrillation: Secondary | ICD-10-CM | POA: Diagnosis not present

## 2023-11-13 LAB — BASIC METABOLIC PANEL WITH GFR
Anion gap: 7 (ref 5–15)
BUN: 15 mg/dL (ref 8–23)
CO2: 26 mmol/L (ref 22–32)
Calcium: 9 mg/dL (ref 8.9–10.3)
Chloride: 103 mmol/L (ref 98–111)
Creatinine, Ser: 0.89 mg/dL (ref 0.44–1.00)
GFR, Estimated: >60 mL/min (ref >60)
Glucose, Bld: 98 mg/dL (ref 70–99)
Potassium: 4 mmol/L (ref 3.5–5.1)
Sodium: 136 mmol/L (ref 135–145)

## 2023-11-18 ENCOUNTER — Encounter (HOSPITAL_BASED_OUTPATIENT_CLINIC_OR_DEPARTMENT_OTHER)
Admission: RE | Disposition: A | Discharge status: Home / Self Care | Payer: Self-pay | Source: Home / Self Care | Attending: Plastic Surgery

## 2023-11-18 ENCOUNTER — Other Ambulatory Visit: Payer: Self-pay

## 2023-11-18 ENCOUNTER — Ambulatory Visit (HOSPITAL_BASED_OUTPATIENT_CLINIC_OR_DEPARTMENT_OTHER): Payer: Self-pay | Admitting: Certified Registered"

## 2023-11-18 ENCOUNTER — Ambulatory Visit (HOSPITAL_BASED_OUTPATIENT_CLINIC_OR_DEPARTMENT_OTHER)
Admission: RE | Admit: 2023-11-18 | Discharge: 2023-11-18 | Disposition: A | Discharge status: Home / Self Care | Attending: Plastic Surgery | Admitting: Plastic Surgery

## 2023-11-18 ENCOUNTER — Encounter (HOSPITAL_BASED_OUTPATIENT_CLINIC_OR_DEPARTMENT_OTHER): Payer: Self-pay | Admitting: Plastic Surgery

## 2023-11-18 DIAGNOSIS — Z01818 Encounter for other preprocedural examination: Secondary | ICD-10-CM

## 2023-11-18 DIAGNOSIS — G4730 Sleep apnea, unspecified: Secondary | ICD-10-CM | POA: Insufficient documentation

## 2023-11-18 DIAGNOSIS — Z853 Personal history of malignant neoplasm of breast: Secondary | ICD-10-CM | POA: Diagnosis not present

## 2023-11-18 DIAGNOSIS — L98492 Non-pressure chronic ulcer of skin of other sites with fat layer exposed: Secondary | ICD-10-CM | POA: Diagnosis not present

## 2023-11-18 DIAGNOSIS — N651 Disproportion of reconstructed breast: Secondary | ICD-10-CM | POA: Diagnosis not present

## 2023-11-18 DIAGNOSIS — I4891 Unspecified atrial fibrillation: Secondary | ICD-10-CM | POA: Insufficient documentation

## 2023-11-18 DIAGNOSIS — N6489 Other specified disorders of breast: Secondary | ICD-10-CM | POA: Diagnosis not present

## 2023-11-18 DIAGNOSIS — K219 Gastro-esophageal reflux disease without esophagitis: Secondary | ICD-10-CM | POA: Insufficient documentation

## 2023-11-18 DIAGNOSIS — Z9013 Acquired absence of bilateral breasts and nipples: Secondary | ICD-10-CM | POA: Insufficient documentation

## 2023-11-18 DIAGNOSIS — G4733 Obstructive sleep apnea (adult) (pediatric): Secondary | ICD-10-CM

## 2023-11-18 DIAGNOSIS — Z9221 Personal history of antineoplastic chemotherapy: Secondary | ICD-10-CM | POA: Insufficient documentation

## 2023-11-18 DIAGNOSIS — Z170 Estrogen receptor positive status [ER+]: Secondary | ICD-10-CM | POA: Diagnosis present

## 2023-11-18 DIAGNOSIS — N641 Fat necrosis of breast: Secondary | ICD-10-CM | POA: Diagnosis not present

## 2023-11-18 DIAGNOSIS — Z7901 Long term (current) use of anticoagulants: Secondary | ICD-10-CM | POA: Insufficient documentation

## 2023-11-18 DIAGNOSIS — M797 Fibromyalgia: Secondary | ICD-10-CM | POA: Insufficient documentation

## 2023-11-18 DIAGNOSIS — I10 Essential (primary) hypertension: Secondary | ICD-10-CM

## 2023-11-18 DIAGNOSIS — R7303 Prediabetes: Secondary | ICD-10-CM | POA: Insufficient documentation

## 2023-11-18 DIAGNOSIS — C50512 Malignant neoplasm of lower-outer quadrant of left female breast: Secondary | ICD-10-CM | POA: Diagnosis present

## 2023-11-18 DIAGNOSIS — N611 Abscess of the breast and nipple: Secondary | ICD-10-CM | POA: Diagnosis not present

## 2023-11-18 HISTORY — PX: CAPSULECTOMY: SHX5381

## 2023-11-18 HISTORY — PX: LIPOSUCTION: SHX10

## 2023-11-18 HISTORY — PX: REVISION, RECONSTRUCTION, BREAST: SHX7641

## 2023-11-18 SURGERY — REVISION, RECONSTRUCTION, BREAST
Anesthesia: General | Site: Breast | Laterality: Right

## 2023-11-18 MED ORDER — ACETAMINOPHEN 500 MG PO TABS
ORAL_TABLET | ORAL | Status: AC
  Filled 2023-11-18: qty 2

## 2023-11-18 MED ORDER — ACETAMINOPHEN 10 MG/ML IV SOLN: 1000.0000 mg | Freq: Once | INTRAVENOUS | Status: DC | PRN

## 2023-11-18 MED ORDER — CLINDAMYCIN PHOSPHATE 900 MG/50ML IV SOLN
900.0000 mg | INTRAVENOUS | Status: AC
  Administered 2023-11-18: 900 mg via INTRAVENOUS

## 2023-11-18 MED ORDER — FENTANYL CITRATE (PF) 100 MCG/2ML IJ SOLN: 25.0000 ug | INTRAMUSCULAR | Status: DC | PRN

## 2023-11-18 MED ORDER — PHENYLEPHRINE 80 MCG/ML (10ML) SYRINGE FOR IV PUSH (FOR BLOOD PRESSURE SUPPORT)
PREFILLED_SYRINGE | INTRAVENOUS | Status: AC
  Filled 2023-11-18: qty 10

## 2023-11-18 MED ORDER — ONDANSETRON HCL 4 MG/2ML IJ SOLN: 4.0000 mg | Freq: Once | INTRAMUSCULAR | Status: DC | PRN

## 2023-11-18 MED ORDER — EPHEDRINE 5 MG/ML INJ
INTRAVENOUS | Status: AC
  Filled 2023-11-18: qty 5

## 2023-11-18 MED ORDER — ACETAMINOPHEN 325 MG RE SUPP: 650.0000 mg | RECTAL | Status: DC | PRN

## 2023-11-18 MED ORDER — PHENYLEPHRINE HCL (PRESSORS) 10 MG/ML IV SOLN
INTRAVENOUS | Status: DC | PRN
  Administered 2023-11-18 (×3): 80 ug via INTRAVENOUS

## 2023-11-18 MED ORDER — DEXAMETHASONE SODIUM PHOSPHATE 10 MG/ML IJ SOLN
INTRAMUSCULAR | Status: AC
Start: 2023-11-18 — End: ?
  Filled 2023-11-18: qty 1

## 2023-11-18 MED ORDER — PROPOFOL 10 MG/ML IV BOLUS
INTRAVENOUS | Status: DC | PRN
  Administered 2023-11-18: 150 mg via INTRAVENOUS

## 2023-11-18 MED ORDER — EPHEDRINE SULFATE (PRESSORS) 50 MG/ML IJ SOLN
INTRAMUSCULAR | Status: DC | PRN
  Administered 2023-11-18: 5 mg via INTRAVENOUS
  Administered 2023-11-18 (×2): 10 mg via INTRAVENOUS

## 2023-11-18 MED ORDER — LIDOCAINE HCL (CARDIAC) PF 100 MG/5ML IV SOSY
PREFILLED_SYRINGE | INTRAVENOUS | Status: DC | PRN
  Administered 2023-11-18: 100 mg via INTRAVENOUS

## 2023-11-18 MED ORDER — LIDOCAINE HCL 1 % IJ SOLN
INTRAVENOUS | Status: DC | PRN
  Administered 2023-11-18: 100 mL

## 2023-11-18 MED ORDER — HYDROMORPHONE HCL 1 MG/ML IJ SOLN: 0.2500 mg | INTRAMUSCULAR | Status: DC | PRN

## 2023-11-18 MED ORDER — SODIUM CHLORIDE 0.9 % IV SOLN: 250.0000 mL | INTRAVENOUS | Status: DC | PRN

## 2023-11-18 MED ORDER — ACETAMINOPHEN 500 MG PO TABS
1000.0000 mg | ORAL_TABLET | Freq: Once | ORAL | Status: AC
  Administered 2023-11-18: 1000 mg via ORAL

## 2023-11-18 MED ORDER — ACETAMINOPHEN 500 MG PO TABS
ORAL_TABLET | ORAL | Status: AC
  Filled 2023-11-18: qty 1

## 2023-11-18 MED ORDER — FENTANYL CITRATE (PF) 100 MCG/2ML IJ SOLN
INTRAMUSCULAR | Status: AC
  Filled 2023-11-18: qty 2

## 2023-11-18 MED ORDER — PROPOFOL 10 MG/ML IV BOLUS
INTRAVENOUS | Status: AC
  Filled 2023-11-18: qty 20

## 2023-11-18 MED ORDER — ONDANSETRON HCL 4 MG/2ML IJ SOLN
INTRAMUSCULAR | Status: DC | PRN
  Administered 2023-11-18: 4 mg via INTRAVENOUS

## 2023-11-18 MED ORDER — LIDOCAINE-EPINEPHRINE 1 %-1:100000 IJ SOLN
INTRAMUSCULAR | Status: DC | PRN
  Administered 2023-11-18: 10 mL

## 2023-11-18 MED ORDER — SODIUM CHLORIDE 0.9% FLUSH: 3.0000 mL | Freq: Two times a day (BID) | INTRAVENOUS | Status: DC

## 2023-11-18 MED ORDER — FENTANYL CITRATE (PF) 100 MCG/2ML IJ SOLN
INTRAMUSCULAR | Status: DC | PRN
  Administered 2023-11-18 (×2): 25 ug via INTRAVENOUS

## 2023-11-18 MED ORDER — OXYCODONE HCL 5 MG PO TABS
5.0000 mg | ORAL_TABLET | Freq: Once | ORAL | Status: AC | PRN
  Administered 2023-11-18: 5 mg via ORAL

## 2023-11-18 MED ORDER — ONDANSETRON HCL 4 MG/2ML IJ SOLN
INTRAMUSCULAR | Status: AC
  Filled 2023-11-18: qty 2

## 2023-11-18 MED ORDER — LIDOCAINE HCL (PF) 1 % IJ SOLN
INTRAMUSCULAR | Status: AC
  Filled 2023-11-18: qty 60

## 2023-11-18 MED ORDER — OXYCODONE HCL 5 MG/5ML PO SOLN: 5.0000 mg | Freq: Once | ORAL | Status: AC | PRN

## 2023-11-18 MED ORDER — VASHE WOUND IRRIGATION OPTIME
TOPICAL | Status: DC | PRN
  Administered 2023-11-18: 34 [oz_av]

## 2023-11-18 MED ORDER — SODIUM CHLORIDE 0.9% FLUSH: 3.0000 mL | INTRAVENOUS | Status: DC | PRN

## 2023-11-18 MED ORDER — CHLORHEXIDINE GLUCONATE CLOTH 2 % EX PADS
6.0000 | MEDICATED_PAD | Freq: Once | CUTANEOUS | Status: AC
  Administered 2023-11-18: 6 via TOPICAL

## 2023-11-18 MED ORDER — LACTATED RINGERS IV SOLN: INTRAVENOUS | Status: DC

## 2023-11-18 MED ORDER — CLINDAMYCIN PHOSPHATE 900 MG/50ML IV SOLN
INTRAVENOUS | Status: AC
  Filled 2023-11-18: qty 50

## 2023-11-18 MED ORDER — EPINEPHRINE PF 1 MG/ML IJ SOLN
INTRAMUSCULAR | Status: AC
  Filled 2023-11-18: qty 1

## 2023-11-18 MED ORDER — LIDOCAINE 2% (20 MG/ML) 5 ML SYRINGE
INTRAMUSCULAR | Status: AC
  Filled 2023-11-18: qty 5

## 2023-11-18 MED ORDER — DEXAMETHASONE SODIUM PHOSPHATE 10 MG/ML IJ SOLN
INTRAMUSCULAR | Status: DC | PRN
  Administered 2023-11-18: 5 mg via INTRAVENOUS

## 2023-11-18 MED ORDER — ACETAMINOPHEN 325 MG PO TABS: 650.0000 mg | ORAL_TABLET | ORAL | Status: DC | PRN

## 2023-11-18 MED ORDER — OXYCODONE HCL 5 MG PO TABS
ORAL_TABLET | ORAL | Status: AC
  Filled 2023-11-18: qty 1

## 2023-11-18 MED ORDER — OXYCODONE HCL 5 MG PO TABS: 5.0000 mg | ORAL_TABLET | ORAL | Status: DC | PRN

## 2023-11-18 SURGICAL SUPPLY — 65 items
BAG DECANTER FOR FLEXI CONT (MISCELLANEOUS) ×2 IMPLANT
BINDER ABDOMINAL 10 UNV 27-48 (MISCELLANEOUS) IMPLANT
BINDER ABDOMINAL 12 SM 30-45 (SOFTGOODS) IMPLANT
BINDER ABDOMINAL 9 SM 30-45 (SOFTGOODS) IMPLANT
BINDER BREAST LRG (GAUZE/BANDAGES/DRESSINGS) IMPLANT
BINDER BREAST MEDIUM (GAUZE/BANDAGES/DRESSINGS) IMPLANT
BINDER BREAST XLRG (GAUZE/BANDAGES/DRESSINGS) IMPLANT
BINDER BREAST XXLRG (GAUZE/BANDAGES/DRESSINGS) IMPLANT
BLADE HEX COATED 2.75 (ELECTRODE) ×2 IMPLANT
BLADE SURG 10 STRL SS (BLADE) IMPLANT
BLADE SURG 15 STRL LF DISP TIS (BLADE) ×4 IMPLANT
BNDG GAUZE DERMACEA FLUFF 4 (GAUZE/BANDAGES/DRESSINGS) ×4 IMPLANT
CANISTER SUCT 1200ML W/VALVE (MISCELLANEOUS) ×2 IMPLANT
COVER BACK TABLE 60X90IN (DRAPES) ×2 IMPLANT
COVER MAYO STAND STRL (DRAPES) ×2 IMPLANT
DERMABOND ADVANCED .7 DNX12 (GAUZE/BANDAGES/DRESSINGS) IMPLANT
DRAIN CHANNEL 19F RND (DRAIN) IMPLANT
DRAPE LAPAROSCOPIC ABDOMINAL (DRAPES) ×2 IMPLANT
DRESSING MEPILEX FLEX 4X4 (GAUZE/BANDAGES/DRESSINGS) IMPLANT
DRSG MEPILEX POST OP 4X8 (GAUZE/BANDAGES/DRESSINGS) IMPLANT
ELECT BLADE 6.5 EXT (BLADE) IMPLANT
ELECTRODE BLDE 4.0 EZ CLN MEGD (MISCELLANEOUS) IMPLANT
ELECTRODE REM PT RTRN 9FT ADLT (ELECTROSURGICAL) ×2 IMPLANT
EVACUATOR SILICONE 100CC (DRAIN) IMPLANT
GAUZE PAD ABD 8X10 STRL (GAUZE/BANDAGES/DRESSINGS) ×4 IMPLANT
GLOVE BIO SURGEON STRL SZ 6.5 (GLOVE) ×4 IMPLANT
GLOVE BIOGEL PI IND STRL 7.0 (GLOVE) IMPLANT
GLOVE SURG SS PI 6.5 STRL IVOR (GLOVE) IMPLANT
GOWN STRL REUS W/ TWL LRG LVL3 (GOWN DISPOSABLE) ×4 IMPLANT
KIT FILL ASEPTIC TRANSFER (MISCELLANEOUS) IMPLANT
LINER CANISTER 1000CC FLEX (MISCELLANEOUS) ×2 IMPLANT
NDL HYPO 25X1 1.5 SAFETY (NEEDLE) ×2 IMPLANT
NDL SAFETY ECLIPSE 18X1.5 (NEEDLE) ×2 IMPLANT
NEEDLE HYPO 25X1 1.5 SAFETY (NEEDLE) ×1 IMPLANT
NS IRRIG 1000ML POUR BTL (IV SOLUTION) ×2 IMPLANT
PACK BASIN DAY SURGERY FS (CUSTOM PROCEDURE TRAY) ×2 IMPLANT
PAD ALCOHOL SWAB (MISCELLANEOUS) ×2 IMPLANT
PAD FOAM SILICONE BACKED (GAUZE/BANDAGES/DRESSINGS) ×2 IMPLANT
PENCIL SMOKE EVACUATOR (MISCELLANEOUS) ×2 IMPLANT
PIN SAFETY STERILE (MISCELLANEOUS) IMPLANT
POWDER MYRIAD MORCLLS FINE 500 (Miscellaneous) IMPLANT
SLEEVE SCD COMPRESS KNEE MED (STOCKING) ×2 IMPLANT
SPIKE FLUID TRANSFER (MISCELLANEOUS) IMPLANT
SPONGE T-LAP 18X18 ~~LOC~~+RFID (SPONGE) ×4 IMPLANT
STAPLER SKIN PROX WIDE 3.9 (STAPLE) IMPLANT
STRIP CLOSURE SKIN 1/2X4 (GAUZE/BANDAGES/DRESSINGS) IMPLANT
STRIP SUTURE WOUND CLOSURE 1/2 (MISCELLANEOUS) IMPLANT
SUT MNCRL AB 4-0 PS2 18 (SUTURE) ×2 IMPLANT
SUT MON AB 3-0 SH27 (SUTURE) ×2 IMPLANT
SUT MON AB 5-0 PS2 18 (SUTURE) ×4 IMPLANT
SUT PDS 3-0 CT2 (SUTURE) ×2 IMPLANT
SUT PDS II 3-0 CT2 27 ABS (SUTURE) IMPLANT
SUT VIC AB 3-0 SH 27X BRD (SUTURE) IMPLANT
SUT VIC AB 4-0 PS2 18 (SUTURE) IMPLANT
SYR 50ML LL SCALE MARK (SYRINGE) ×2 IMPLANT
SYR BULB IRRIG 60ML STRL (SYRINGE) ×2 IMPLANT
SYR CONTROL 10ML LL (SYRINGE) ×2 IMPLANT
SYR TB 1ML LL NO SAFETY (SYRINGE) ×2 IMPLANT
TOWEL GREEN STERILE FF (TOWEL DISPOSABLE) ×4 IMPLANT
TRAY DSU PREP LF (CUSTOM PROCEDURE TRAY) ×2 IMPLANT
TUBE CONNECTING 20X1/4 (TUBING) ×2 IMPLANT
TUBING INFILTRATION IT-10001 (TUBING) IMPLANT
TUBING SET GRADUATE ASPIR 12FT (MISCELLANEOUS) ×2 IMPLANT
UNDERPAD 30X36 HEAVY ABSORB (UNDERPADS AND DIAPERS) ×4 IMPLANT
YANKAUER SUCT BULB TIP NO VENT (SUCTIONS) ×2 IMPLANT

## 2023-11-18 NOTE — Anesthesia Postprocedure Evaluation (Signed)
 Anesthesia Post Note  Patient: Krystal [name]  Procedure(s) Performed: REVISION RIGHT BREAST RECONSTRUCTION (Right: Breast) EXCISION OF RIGHT BREAST CAPSULE CONTRACTURE AND BREAST TISSUE (Right: Breast) LIPOSUCTION LATERAL RIGHT CHEST (Right: Breast)     Patient location during evaluation: PACU Anesthesia Type: General Level of consciousness: awake and alert Pain management: pain level controlled Vital Signs Assessment: post-procedure vital signs reviewed and stable Respiratory status: spontaneous breathing, nonlabored ventilation, respiratory function stable and patient connected to nasal cannula oxygen Cardiovascular status: blood pressure returned to baseline and stable Postop Assessment: no apparent nausea or vomiting Anesthetic complications: no  No notable events documented.  Last Vitals:  Vitals:   11/18/23 1519 11/18/23 1530  BP: (!) 143/64 135/64  Pulse: 75 72  Resp: 17 19  Temp: 36.6 C   SpO2: 98% 96%    Last Pain:  Vitals:   11/18/23 1530  TempSrc:   PainSc: 3                  Rosalita Combe

## 2023-11-18 NOTE — Transfer of Care (Signed)
 Immediate Anesthesia Transfer of Care Note  Patient: Krystal [name]  Procedure(s) Performed: REVISION RIGHT BREAST RECONSTRUCTION (Right: Breast) RIGHT BREAST CAPSULECTOMY AND EXCISION SCAR (Right: Breast) LIPOSUCTION (Right: Breast)  Patient Location: PACU  Anesthesia Type:General  Level of Consciousness: awake, alert , and oriented  Airway & Oxygen Therapy: Patient Spontanous Breathing and Patient connected to face mask oxygen  Post-op Assessment: Report given to RN and Post -op Vital signs reviewed and stable  Post vital signs: Reviewed and stable  Last Vitals:  Vitals Value Taken Time  BP 143/64 11/18/23 1519  Temp 36.6 C 11/18/23 1519  Pulse 71 11/18/23 1521  Resp 16 11/18/23 1521  SpO2 100 % 11/18/23 1521  Vitals shown include unfiled device data.  Last Pain:  Vitals:   11/18/23 1519  TempSrc:   PainSc: 4       Patients Stated Pain Goal: 3 (11/18/23 1246)  Complications: No notable events documented.

## 2023-11-18 NOTE — Op Note (Signed)
 DATE OF OPERATION: 11/18/2023  LOCATION: [facility]  PREOPERATIVE DIAGNOSIS: right breast asymmetry after cancer treatment  POSTOPERATIVE DIAGNOSIS: Same  PROCEDURE: Excision of right breast capsule contracture and breast tissue 2 x 2 cm Advancement of 100 cm2 inferior right chest. Liposuction lateral right chest  SURGEON: Gilles Lacks, DO  ASSISTANT: Lamount Pimple, PA  EBL: none  CONDITION: Stable  COMPLICATIONS: None  INDICATION: The patient, Krystal [name], is a 73 y.o. female born on 1950/09/06, is here for treatment of breast asymmetry after cancer treatment.   PROCEDURE DETAILS:  The patient was seen prior to surgery and marked.  The IV antibiotics were given. The patient was taken to the operating room and given a general anesthetic. A standard time out was performed and all information was confirmed by those in the room. SCDs were placed.   The chest was prepped and draped.  Tumescent was placed in the right lateral breast.  Liposuction was done to minimize the bulge.  The previous incision was opened for an area of 8 cm.  The draining area with the capsule contracture was excised 2 x 2 cm.  The breast skin flap was freed from the pectoralis muscle.  This allowed the muscle to be repositioned inferiorly by 3 cm.  This was at least 100 cm2.  This was then secured with the 3-0 PDS.  All of the myriad was applied between under the muscle and on top of the muscle.  The deep layer was then closed with the 3-0 Monocryl.   The patient was allowed to wake up and taken to recovery room in stable condition at the end of the case. The family was notified at the end of the case.   The advanced practice practitioner (APP) assisted throughout the case.  The APP was essential in retraction and counter traction when needed to make the case progress smoothly.  This retraction and assistance made it possible to see the tissue plans for the procedure.  The assistance was needed  for blood control, tissue re-approximation and assisted with closure of the incision site.

## 2023-11-18 NOTE — Discharge Instructions (Addendum)
 INSTRUCTIONS FOR AFTER BREAST SURGERY   You will likely have some questions about what to expect following your operation.  The following information will help you and your family understand what to expect when you are discharged from the hospital.  It is important to follow these guidelines to help ensure a smooth recovery and reduce complication.  Postoperative instructions include information on: diet, wound care, medications and physical activity.  AFTER SURGERY Expect to go home after the procedure.  In some cases, you may need to spend one night in the hospital for observation.  DIET Breast surgery does not require a specific diet.  However, the healthier you eat the better your body will heal. It is important to increasing your protein intake.  This means limiting the foods with sugar and carbohydrates.  Focus on vegetables and some meat.  If you have liposuction during your procedure be sure to drink water.  If your urine is bright yellow, then it is concentrated, and you need to drink more water.  As a general rule after surgery, you should have 8 ounces of water every hour while awake.  If you find you are persistently nauseated or unable to take in liquids let us  know.  NO TOBACCO USE or EXPOSURE.  This will slow your healing process and lead to a wound.  WOUND CARE Leave the binder on for 3 days . Use fragrance free soap like Dial, Dove or [place].   After 3 days you can remove the binder to shower. Once dry apply binder or sports bra. If you have liposuction you will have a soft and spongy dressing (Lipofoam) that helps prevent creases in your skin.  Remove before you shower and then replace it.  It is also available on [employer]. If you have steri-strips / tape directly attached to your skin leave them in place. It is OK to get these wet.   No baths, pools or hot tubs for four weeks. We close your incision to leave the smallest and best-looking scar. No ointment or creams on your incisions  for four weeks.  No Neosporin (Too many skin reactions).  A few weeks after surgery you can use Mederma and start massaging the scar. We ask you to wear your binder or sports bra for the first 6 weeks around the clock, including while sleeping. This provides added comfort and helps reduce the fluid accumulation at the surgery site. NO Ice or heating pads to the operative site.  You have a very high risk of a BURN before you feel the temperature change.  ACTIVITY No heavy lifting until cleared by the doctor.  This usually means no more than a half-gallon of milk.  It is OK to walk and climb stairs. Moving your legs is very important to decrease your risk of a blood clot.  It will also help keep you from getting deconditioned.  Every 1 to 2 hours get up and walk for 5 minutes. This will help with a quicker recovery back to normal.  Let pain be your guide so you don't do too much.  This time is for you to recover.  You will be more comfortable if you sleep and rest with your head elevated either with a few pillows under you or in a recliner.  No stomach sleeping for a three months.  WORK Everyone returns to work at different times. As a rough guide, most people take at least 1 - 2 weeks off prior to returning to work. If  you need documentation for your job, give the forms to the front staff at the clinic.  DRIVING Arrange for someone to bring you home from the hospital after your surgery.  You may be able to drive a few days after surgery but not while taking any narcotics or valium .  BOWEL MOVEMENTS Constipation can occur after anesthesia and while taking pain medication.  It is important to stay ahead for your comfort.  We recommend taking Milk of Magnesia (2 tablespoons; twice a day) while taking the pain pills.  MEDICATIONS You may be prescribed should start after surgery At your preoperative visit for you history and physical you may have been given the following medications: An antibiotic: Start  this medication when you get home and take according to the instructions on the bottle. Zofran  4 mg:  This is to treat nausea and vomiting.  You can take this every 6 hours as needed and only if needed. Valium  2 mg for breast cancer patients: This is for muscle tightness if you have an implant or expander. This will help relax your muscle which also helps with pain control.  This can be taken every 12 hours as needed. Don't drive after taking this medication. Norco (hydrocodone/acetaminophen ) 5/325 mg:  This is only to be used after you have taken the Motrin or the Tylenol . Every 8 hours as needed.   Over the counter Medication to take: Ibuprofen (Motrin) 600 mg:  Take this every 6 hours.  If you have additional pain then take 500 mg of the Tylenol  every 8 hours.  Only take the Norco after you have tried these two. MiraLAX or Milk of Magnesia: Take this according to the bottle if you take the Norco.  WHEN TO CALL Call your surgeon's office if any of the following occur: Fever 101 degrees F or greater Excessive bleeding or fluid from the incision site. Pain that increases over time without aid from the medications Redness, warmth, or pus draining from incision sites Persistent nausea or inability to take in liquids Severe misshapen area that underwent the operation.    No Tylenol  until 8pm   Post Anesthesia Home Care Instructions  Activity: Get plenty of rest for the remainder of the day. A responsible individual must stay with you for 24 hours following the procedure.  For the next 24 hours, DO NOT: -Drive a car -Advertising copywriter -Drink alcoholic beverages -Take any medication unless instructed by your physician -Make any legal decisions or sign important papers.  Meals: Start with liquid foods such as gelatin or soup. Progress to regular foods as tolerated. Avoid greasy, spicy, heavy foods. If nausea and/or vomiting occur, drink only clear liquids until the nausea and/or  vomiting subsides. Call your physician if vomiting continues.  Special Instructions/Symptoms: Your throat may feel dry or sore from the anesthesia or the breathing tube placed in your throat during surgery. If this causes discomfort, gargle with warm salt water. The discomfort should disappear within 24 hours.  If you had a scopolamine patch placed behind your ear for the management of post- operative nausea and/or vomiting:  1. The medication in the patch is effective for 72 hours, after which it should be removed.  Wrap patch in a tissue and discard in the trash. Wash hands thoroughly with soap and water. 2. You may remove the patch earlier than 72 hours if you experience unpleasant side effects which may include dry mouth, dizziness or visual disturbances. 3. Avoid touching the patch. Wash your hands with  soap and water after contact with the patch.

## 2023-11-18 NOTE — Interval H&P Note (Signed)
 History and Physical Interval Note:  11/18/2023 2:26 PM  Krystal [name]  has presented today for surgery, with the diagnosis of Malignant neoplasm of lower-outer quadrant of left breast of female, estrogen receptor positive.  The various methods of treatment have been discussed with the patient and family. After consideration of risks, benefits and other options for treatment, the patient has consented to  Procedure(s) with comments: REVISION, RECONSTRUCTION, BREAST (Right) - Excision of scar, capsule contracture right breast , possible liposuction CAPSULECTOMY, BREAST (Right) LIPOSUCTION (Right) as a surgical intervention.  The patient's history has been reviewed, patient examined, no change in status, stable for surgery.  I have reviewed the patient's chart and labs.  Questions were answered to the patient's satisfaction.     Lindaann Requena Ozan Maclay

## 2023-11-18 NOTE — Anesthesia Preprocedure Evaluation (Signed)
 Anesthesia Evaluation  Patient identified by MRN, date of birth, ID band Patient awake    Reviewed: Allergy & Precautions, NPO status , Patient's Chart, lab work & pertinent test results  History of Anesthesia Complications Negative for: history of anesthetic complications  Airway Mallampati: III  TM Distance: >3 FB Neck ROM: Full    Dental no notable dental hx. (+) Teeth Intact, Dental Advisory Given   Pulmonary sleep apnea and Continuous Positive Airway Pressure Ventilation    Pulmonary exam normal breath sounds clear to auscultation       Cardiovascular hypertension, (-) angina (-) Past MI Normal cardiovascular exam+ dysrhythmias Atrial Fibrillation  Rhythm:Regular Rate:Normal     Neuro/Psych    GI/Hepatic Neg liver ROS,GERD  ,,  Endo/Other    Renal/GU Lab Results      Component                Value               Date                      NA                       136                 11/13/2023                CL                       103                 11/13/2023                K                        4.0                 11/13/2023                CO2                      26                  11/13/2023                BUN                      15                  11/13/2023                CREATININE               0.89                11/13/2023                GFRNONAA                 >60                 11/13/2023                CALCIUM                   9.0  11/13/2023                ALBUMIN                  4.5                 06/12/2023                GLUCOSE                  98                  11/13/2023                Musculoskeletal  (+) Arthritis ,  Fibromyalgia -  Abdominal   Peds  Hematology eliquis    Anesthesia Other Findings L breast  Reproductive/Obstetrics                             Anesthesia Physical Anesthesia Plan  ASA: 3  Anesthesia Plan: General    Post-op Pain Management: Precedex and Tylenol  PO (pre-op)*   Induction: Intravenous  PONV Risk Score and Plan: 3 and Treatment may vary due to age or medical condition, Ondansetron , Midazolam  and Dexamethasone   Airway Management Planned: LMA  Additional Equipment: None  Intra-op Plan:   Post-operative Plan: Extubation in OR  Informed Consent: I have reviewed the patients History and Physical, chart, labs and discussed the procedure including the risks, benefits and alternatives for the proposed anesthesia with the patient or authorized representative who has indicated his/her understanding and acceptance.     Dental advisory given  Plan Discussed with: CRNA and Surgeon  Anesthesia Plan Comments:        Anesthesia Quick Evaluation

## 2023-11-18 NOTE — Interval H&P Note (Signed)
 History and Physical Interval Note:  11/18/2023 2:26 PM  MELEK KLEMPNER  has presented today for surgery, with the diagnosis of Malignant neoplasm of lower-outer quadrant of left breast of female, estrogen receptor positive.  The various methods of treatment have been discussed with the patient and family. After consideration of risks, benefits and other options for treatment, the patient has consented to  Procedure(s) with comments: REVISION, RECONSTRUCTION, BREAST (Right) - Excision of scar, capsule contracture right breast , possible liposuction CAPSULECTOMY, BREAST (Right) LIPOSUCTION (Right) as a surgical intervention.  The patient's history has been reviewed, patient examined, no change in status, stable for surgery.  I have reviewed the patient's chart and labs.  Questions were answered to the patient's satisfaction.     Lindaann Requena Ozan Maclay

## 2023-11-18 NOTE — Anesthesia Procedure Notes (Signed)
 Procedure Name: LMA Insertion Date/Time: 11/18/2023 2:37 PM  Performed by: Noralyn Beams, CRNAPre-anesthesia Checklist: Patient identified, Emergency Drugs available, Suction available and Patient being monitored Patient Re-evaluated:Patient Re-evaluated prior to induction Oxygen Delivery Method: Circle system utilized Preoxygenation: Pre-oxygenation with 100% oxygen Induction Type: IV induction Ventilation: Mask ventilation without difficulty LMA: LMA inserted LMA Size: 4.0 Number of attempts: 1 Airway Equipment and Method: Bite block Placement Confirmation: positive ETCO2 Tube secured with: Tape Dental Injury: Teeth and Oropharynx as per pre-operative assessment

## 2023-11-19 ENCOUNTER — Encounter (HOSPITAL_BASED_OUTPATIENT_CLINIC_OR_DEPARTMENT_OTHER): Payer: Self-pay | Admitting: Plastic Surgery

## 2023-11-20 ENCOUNTER — Telehealth: Payer: Self-pay

## 2023-11-20 LAB — SURGICAL PATHOLOGY

## 2023-11-20 NOTE — Telephone Encounter (Signed)
 Patient left voicemail requesting pain medication. She stated that her husband told her didn't need any. She also requested a referral to PT due to having limited mobility in one of her arms.   Pt had R breast surgery with Dr. Orin Birk on 4/28.   I called patient back and adv her that on 4/11 during her pre-op with Anastacio Balm, [place] cephalexin  and oxycodone  were sent to [facility]. Patient stated that she didn't pick those up because she wasn't aware that they were sent there. She asked if they could be sent to [facility] [address] in [place]. I adv that I would send a message to [place], [place] to re-send to that pharmacy. Adv patient that [place], [place] adv her to take ibuprofen and tylenol  during the day for pain and only use the oxy when needed. Patient conveyed understanding. I also adv to discuss the PT referral at her post op visit on 5/6. She conveyed understanding.

## 2023-11-21 NOTE — Telephone Encounter (Signed)
 Checked PDMP, patient picked up rx for oxycodone  5mg  (10 tablets) on 11/01/2023 after her pre-op with Lamount Pimple PA-C.

## 2023-11-22 ENCOUNTER — Telehealth: Payer: Self-pay | Admitting: Surgical

## 2023-11-22 ENCOUNTER — Other Ambulatory Visit: Payer: Self-pay | Admitting: Surgical

## 2023-11-22 DIAGNOSIS — Z9889 Other specified postprocedural states: Secondary | ICD-10-CM

## 2023-11-22 DIAGNOSIS — C50512 Malignant neoplasm of lower-outer quadrant of left female breast: Secondary | ICD-10-CM

## 2023-11-22 DIAGNOSIS — I4891 Unspecified atrial fibrillation: Secondary | ICD-10-CM

## 2023-11-22 DIAGNOSIS — Z9013 Acquired absence of bilateral breasts and nipples: Secondary | ICD-10-CM

## 2023-11-22 NOTE — Telephone Encounter (Signed)
 Called and spoke with patient in regards to her questions about additional oxycodone  prescription.  I asked the patient if she picked up her prescription on 11/01/2023, she stated that she needed to [facility] that [facility].  I discussed with the patient that I reviewed PDMP and it states that she picked up this prescription the day of her preop with Anastacio Balm on 11/01/2023.  I asked patient for clarification, she stated that she actually did pick this medication up and took this prior to surgery.  She reports that she is having some just dull generalized aching pain of surgical site, specifically where liposuction was performed.  I discussed with patient that I would normally prescribe Toradol , however she is on Eliquis .  Offered tramadol  prescription for 1 to 2 days to assist with breakthrough pain, she declined.  Reports that she had tramadol , but does not want to take this -she reports she had a prescription for this in the past but does not help her.  Discussed with patient that I recommend 1000 mg of Tylenol  every 8 hours for the next few days to help with pain control.  I do not feel comfortable sending her prescription for oxycodone .  Patient was understanding and agreeable with this.  Her pain is mild and Tylenol  would be a great option for mild pain control.

## 2023-11-26 ENCOUNTER — Ambulatory Visit: Admitting: Student

## 2023-11-26 ENCOUNTER — Encounter: Payer: Self-pay | Admitting: Student

## 2023-11-26 VITALS — BP 174/83 | HR 72

## 2023-11-26 DIAGNOSIS — Z170 Estrogen receptor positive status [ER+]: Secondary | ICD-10-CM

## 2023-11-26 DIAGNOSIS — C50512 Malignant neoplasm of lower-outer quadrant of left female breast: Secondary | ICD-10-CM

## 2023-11-26 NOTE — Progress Notes (Signed)
 Patient is a 73 year old female with history of breast cancer.  She most recently underwent excision of right breast capsule contracture and right breast tissue, advancement of inferior right chest and liposuction of the lateral right chest with Dr. Orin Birk on 11/18/2023.  She is about 1 week postop.  She presents to the clinic for postoperative follow-up.  Today, patient reports she is doing well.  She reports that her right chest wall feels a little bit warm.  She denies any drainage from the area.  She denies any fevers or chills.  She denies any drainage from the area.  She reports that her pain has been controlled.  She denies any other issues or concerns.  Chaperone present on exam.  On exam, patient is sitting upright in no acute distress.  Right chest is soft.  There is a little bit of swelling, there may be of very small amount of fluid, but no significant fluid collection or hematoma noted on exam.  There is no overlying erythema to the right chest wall.  No tenderness to palpation.  There is a little bit of ecchymosis noted to her right chest and lateral chest, suspect that this is from liposuction.  Mepilex border dressing is clean dry and intact.  This was removed.  Incision appears to be intact with Steri-Strips.  Steri-Strips are clean dry and intact.  There are no overt signs of infection on exam.  Discussed with patient that she appears to have a little bit of swelling, but there are no overt signs of infection on exam.  Recommended that she closely monitor the area, and if she develops any worsening swelling, redness, pain, fevers, chills or has any concerns she is to let us  know.  Patient expressed understanding.  I discussed with the patient the importance of wearing compression at all times.  She expressed understanding.  We will plan to see the patient back next week.  I instructed her to call in the meantime she has any questions or concerns about anything.  Pictures were  obtained of the patient and placed in the chart with the patient's or guardian's permission.

## 2023-11-29 ENCOUNTER — Other Ambulatory Visit: Payer: Self-pay | Admitting: Cardiology

## 2023-12-03 ENCOUNTER — Encounter: Admitting: Student

## 2023-12-03 NOTE — Progress Notes (Deleted)
 Patient is a 73 year old female with history of breast cancer. She most recently underwent excision of right breast capsule contracture and right breast tissue, advancement of inferior right chest and liposuction of the lateral right chest with Dr. Orin Birk on 11/18/2023. Patient is about 2 weeks postop. She presents to the clinic today for postoperative follow-up.  Patient was last seen in the clinic on 11/26/2023.  At this visit, patient was doing well.  On exam, right chest wall was soft.  There was a little bit of swelling, maybe a small amount of fluid, but no significant fluid collection or hematoma noted on exam.  There is no tenderness to palpation.  There was a little bit of ecchymosis noted to her right chest wall and lateral chest wall, suspected that this was from liposuction.  Today,

## 2023-12-06 ENCOUNTER — Encounter: Payer: Self-pay | Admitting: Student

## 2023-12-06 ENCOUNTER — Ambulatory Visit (INDEPENDENT_AMBULATORY_CARE_PROVIDER_SITE_OTHER): Admitting: Student

## 2023-12-06 VITALS — BP 158/84 | HR 81

## 2023-12-06 DIAGNOSIS — Z9889 Other specified postprocedural states: Secondary | ICD-10-CM

## 2023-12-06 NOTE — Progress Notes (Signed)
 Patient is a 73 year old female with history of breast cancer. She most recently underwent excision of right breast capsule contracture and right breast tissue, advancement of inferior right chest and liposuction of the lateral right chest with Dr. Orin Birk on 11/18/2023. Patient is about 2.5 weeks postop. She presents to the clinic today for postoperative follow-up.  Patient was last seen in the clinic on 11/26/2023.  At this visit, patient was doing well.  On exam, right chest wall was soft.  There was a little bit of swelling, maybe a small amount of fluid, but no significant fluid collection or hematoma noted on exam.  There is no tenderness to palpation.  There was a little bit of ecchymosis noted to her right chest wall and lateral chest wall, suspected that this was from liposuction.  Today, patient reports she is doing well.  She states that she feels like she is doing better than she was the last time she was here.  She she denies any fevers or chills.  She does reports she has some soreness to her right lateral chest wall sometimes as well as a little bit of swelling towards the end of the day.  She denies any drainage.  She also reports that she has been having some difficulties with range of motion of her right upper extremity.  Chaperone present on exam.  On exam, patient is sitting upright in no acute distress.  Right chest is soft.  Swelling has gone down significantly.  There is no overlying erythema.  No obvious fluid collections palpated on exam.  There are no overlying skin changes to the right lateral chest.  No significant tenderness to palpation.  Mepilex border dressing is clean dry and intact.  This was removed.  Steri-Strips are clean dry and intact over the incision.  These were removed.  Incision appears to be intact and healing well.  Smaller incision just inferior of the main incision is healing well also.  There are no signs of infection on exam.  Patient does have limited range of  motion of her right upper extremity.  Recommended that patient apply Vaseline to her incision daily.  Discussed with her that she may apply dressing if she would like over her incision.  Patient expressed understanding.  Discussed with her her soreness is most likely due to liposuction.  Discussed with her that this can last a few weeks.  Recommended that she gently massage the area.  Also discussed with her to continue to wear compression at all times.  Patient expressed understanding.  Given limited range of motion, we will plan to send the patient to physical therapy.  Patient was in agreement with this.  Patient to follow back up in another 2 to 3 weeks.  I instructed her to call in the meantime she has any questions or concerns about anything.

## 2023-12-10 ENCOUNTER — Encounter: Admitting: Student

## 2023-12-23 DIAGNOSIS — M75101 Unspecified rotator cuff tear or rupture of right shoulder, not specified as traumatic: Secondary | ICD-10-CM | POA: Diagnosis not present

## 2023-12-23 DIAGNOSIS — M75102 Unspecified rotator cuff tear or rupture of left shoulder, not specified as traumatic: Secondary | ICD-10-CM | POA: Diagnosis not present

## 2023-12-23 DIAGNOSIS — M12811 Other specific arthropathies, not elsewhere classified, right shoulder: Secondary | ICD-10-CM | POA: Diagnosis not present

## 2023-12-23 DIAGNOSIS — M12812 Other specific arthropathies, not elsewhere classified, left shoulder: Secondary | ICD-10-CM | POA: Diagnosis not present

## 2023-12-24 ENCOUNTER — Encounter: Admitting: Plastic Surgery

## 2023-12-26 ENCOUNTER — Ambulatory Visit: Attending: Orthopedic Surgery

## 2023-12-26 ENCOUNTER — Telehealth: Payer: Self-pay | Admitting: Cardiovascular Disease

## 2023-12-26 DIAGNOSIS — M6281 Muscle weakness (generalized): Secondary | ICD-10-CM | POA: Diagnosis not present

## 2023-12-26 DIAGNOSIS — M25512 Pain in left shoulder: Secondary | ICD-10-CM | POA: Diagnosis not present

## 2023-12-26 DIAGNOSIS — M25511 Pain in right shoulder: Secondary | ICD-10-CM | POA: Diagnosis not present

## 2023-12-26 DIAGNOSIS — M25612 Stiffness of left shoulder, not elsewhere classified: Secondary | ICD-10-CM | POA: Insufficient documentation

## 2023-12-26 DIAGNOSIS — G8929 Other chronic pain: Secondary | ICD-10-CM | POA: Diagnosis not present

## 2023-12-26 NOTE — Telephone Encounter (Signed)
   Pre-operative Risk Assessment    Patient Name: Krystal [name]  DOB: 12-10-1950 MRN: [id number]   Date of last office visit: 08/19/23 Date of next office visit: 01/15/24   Request for Surgical Clearance    Procedure:  Left Reverse Total Shoulder Arthroplasty  Date of Surgery:  Clearance TBD                                Surgeon:  Ellard Gunning, MD Surgeon's Group or Practice Name:  [facility] Phone number: [phone]  Fax number:  [phone]   Type of Clearance Requested:   - Pharmacy:  [facility]  (Eliquis )     Type of Anesthesia:  General    Additional requests/questions:    Signed, Fletcher Humble   12/26/2023, 10:29 AM

## 2023-12-26 NOTE — Therapy (Unsigned)
 OUTPATIENT PHYSICAL THERAPY SHOULDER/ELBOW EVALUATION  Patient Name: Krystal [name] MRN: [id number] DOB:[birth year], 73 y.o., female Today's Date: 12/27/2023  END OF SESSION:  PT End of Session - 12/26/23 1327     Visit Number 1    Number of Visits 17    Date for PT Re-Evaluation 02/21/24    PT Start Time 1330    PT Stop Time 1418    PT Time Calculation (min) 48 min    Activity Tolerance Patient tolerated treatment well    Behavior During Therapy [facility] for tasks assessed/performed             Past Medical History:  Diagnosis Date   Anemia    Arthritis    osteo   Blindness of right eye at birth   Cancer [facility]) 2011   Left breast 2011 and cervical age 51   Carpal tunnel syndrome    Cataract    Depression    Edema 07/27/2011   Fibromyalgia    muscle weakness and pain   GERD (gastroesophageal reflux disease)    Hyperlipidemia    Hypertension    benign   Lymphedema    Osteoporosis    Plantar fasciitis    Pre-diabetes    Raynaud's disease    Restless leg syndrome    Rosacea    Rotator cuff rupture    Sleep apnea    Synovitis    Ulcer    Past Surgical History:  Procedure Laterality Date   ABDOMINAL HYSTERECTOMY  1986   For bleeding and pain   ATRIAL FIBRILLATION ABLATION N/A 05/14/2023   Procedure: ATRIAL FIBRILLATION ABLATION;  Surgeon: Boyce Byes, MD;  Location: [facility];  Service: Cardiovascular;  Laterality: N/A;   BLADDER SURGERY  2006   Bladder tack   BREAST RECONSTRUCTION Bilateral 09/17/2012   Procedure: BREAST RECONSTRUCTION;  Surgeon: Marilou Showman, DO;  Location: [facility];  Service: Plastics;  Laterality: Bilateral;  BILATERAL BREAST RECONSTRUCTION WITH TISSUE EXPANDERS AND ALLOMED   BREAST RECONSTRUCTION Right 06/03/2013   Procedure: RIGHT BREAST CAPSULE CONSTRACTURE;  Surgeon: Marilou Showman, DO;  Location: [facility];  Service: Plastics;  Laterality: Right;   CAPSULECTOMY Right 11/18/2023   Procedure:  EXCISION OF RIGHT BREAST CAPSULE CONTRACTURE AND BREAST TISSUE;  Surgeon: Thornell Flirt, DO;  Location: [facility];  Service: Plastics;  Laterality: Right;   CARDIOVERSION N/A 08/06/2022   Procedure: CARDIOVERSION;  Surgeon: Wenona Hamilton, MD;  Location: [facility];  Service: Cardiovascular;  Laterality: N/A;   CARDIOVERSION N/A 10/02/2022   Procedure: CARDIOVERSION;  Surgeon: Sammy Crisp, MD;  Location: [facility];  Service: Cardiovascular;  Laterality: N/A;   CARPAL TUNNEL RELEASE Bilateral 2008   EYE SURGERY Right    INJECTION KNEE Left 08/12/2018   Procedure: KNEE INJECTION-LEFT;  Surgeon: Elner Hahn, MD;  Location: [facility];  Service: Orthopedics;  Laterality: Left;   JOINT REPLACEMENT     LIPOSUCTION Bilateral 01/29/2013   Procedure: LIPOSUCTION;  Surgeon: Marilou Showman, DO;  Location: [facility];  Service: Plastics;  Laterality: Bilateral;   LIPOSUCTION Right 06/03/2013   Procedure: LIPOSUCTION;  Surgeon: Marilou Showman, DO;  Location: [facility];  Service: Plastics;  Laterality: Right;   LIPOSUCTION Right 11/18/2023   Procedure: LIPOSUCTION LATERAL RIGHT CHEST;  Surgeon: Thornell Flirt, DO;  Location: [facility];  Service: Plastics;  Laterality: Right;   MASTECTOMY  2012   rt prophalactic mast-snbx   MASTECTOMY MODIFIED  RADICAL  2012   left-axillary nodes   NEUROMA SURGERY Bilateral    NOSE SURGERY  2007   PORT-A-CATH REMOVAL     insertion and   REVISION, RECONSTRUCTION, BREAST Right 11/18/2023   Procedure: REVISION RIGHT BREAST RECONSTRUCTION;  Surgeon: Thornell Flirt, DO;  Location: [facility];  Service: Plastics;  Laterality: Right;  Excision of scar, capsule contracture right breast , possible liposuction   THROAT SURGERY     TONSILLECTOMY     TOTAL KNEE ARTHROPLASTY Right 08/12/2018   Procedure: TOTAL KNEE ARTHROPLASTY-RIGHT;  Surgeon: Elner Hahn, MD;  Location: [facility];   Service: Orthopedics;  Laterality: Right;   TOTAL KNEE ARTHROPLASTY Left 09/22/2019   Procedure: TOTAL KNEE ARTHROPLASTY;  Surgeon: Elner Hahn, MD;  Location: [facility];  Service: Orthopedics;  Laterality: Left;   UVULOPALATOPHARYNGOPLASTY     Patient Active Problem List   Diagnosis Date Noted   Raynaud's disease 11/05/2022   Osteoporosis, post-menopausal 11/05/2022   Atrial flutter (HCC) 10/02/2022   First degree AV block 09/11/2022   Arthritis 08/08/2022   Persistent atrial fibrillation (HCC) 08/06/2022   Depression 02/27/2022   Gastroesophageal reflux disease 02/27/2022   Hypertension 02/27/2022   Abnormal LFTs 02/27/2022   Alcohol  abuse 02/27/2022   Atrial fibrillation with RVR (HCC) 02/27/2022   Morbid obesity (HCC) 02/27/2022   Pes anserinus bursitis of left knee 02/08/2020   Status post total knee replacement using cement, left 09/22/2019   Status post total knee replacement using cement, right 08/12/2018   Rotator cuff rupture    Status post bilateral breast reconstruction 02/06/2013   Acquired absence of bilateral breasts and nipples 09/17/2012   Breast cancer of lower-outer quadrant of left female breast (HCC) 01/16/2011    PCP: Lyle San, MD   REFERRING PROVIDER:  Harden Leyden, PA-C   REFERRING DIAG:  M75.101 (ICD-10-CM) - Unspecified rotator cuff tear or rupture of right shoulder, not specified as traumatic    RATIONALE FOR EVALUATION AND TREATMENT: Rehabilitation  THERAPY DIAG: Stiffness of left shoulder, not elsewhere classified  Muscle weakness (generalized)  Chronic pain of both shoulders  ONSET DATE: 11/18/2023  FOLLOW-UP APPT SCHEDULED WITH REFERRING PROVIDER: Yes    SUBJECTIVE:                                                                                                                                                                                         SUBJECTIVE STATEMENT:    Patient with chief concern of bilateral shoulder  pain and limited ROM.   PERTINENT HISTORY:   Patient is a 73 year old female with history of breast cancer. She most recently  underwent excision of right breast capsule contracture and right breast tissue, advancement of inferior right chest and liposuction of the lateral right chest with Dr. Orin Birk on 11/18/2023. She she denies any fevers or chills.  No stitches or soreness along the R breast tissue/shoulder. She denies any drainage. She also reports that she has been having some difficulties with range of motion of her right upper extremity.   Pt reports that she has difficulties with lifting a gallon of milk to a top shelf with her RUE; she has to use her L arm to assist her R arm. She reports pain in both arms but R > L. Her L arm is starting to develop pain due to compensating for the R arm. Denies n/t.   She has lifting restrictions, no greater than 10 pounds for 6 weeks postoperatively. (12/30/2023)   Dominant hand: right  PAIN:  Pain Intensity: Present: 1/10, Best: 3/10, Worst: 10/10 Pain location: Bilateral Shoulder  Pain Quality: constant, sharp, and aching  Radiating: Yes  Numbness/Tingling: No Focal Weakness: Yes Aggravating factors: Repetitive Overhead Movements, Laying on R shoulder,  Relieving factors: Ice Modalities 24-hour pain behavior: Activity Dependent History of prior shoulder or neck/shoulder injury, pain, surgery, or therapy: Yes Imaging: Yes  Red flags (personal history of cancer, chills/fever, night sweats, nausea, vomiting, unrelenting pain, unexplained weight gain/loss): Negative  PRECAUTIONS: None  WEIGHT BEARING RESTRICTIONS: Yes She has lifting restrictions, no greater than 10 pounds for 6 weeks postoperatively.  FALLS: Has patient fallen in last 6 months? No  Living Environment Lives with: lives with their family Lives in: House/apartment Stairs: Yes: Internal: 13 steps; on right going up and External: 5 steps; can reach both Has following  equipment at home: None  Prior level of function: Independent  Occupational demands: Retired   Hobbies: Dog (89 yr old)  Patient Goals: "Movement and less pain"    OBJECTIVE:   Patient Surveys  FOTO: QuickDASH:  Cognition Patient is oriented to person, place, and time.  Recent memory is intact.  Remote memory is intact.  Attention span and concentration are intact.  Expressive speech is intact.  Patient's fund of knowledge is within normal limits for educational level.    Gross Musculoskeletal Assessment Tremor: None Bulk: Normal Tone: Normal   Posture Seated: Forward, Rounded Shoulder, increase thoracic kyphosis  Cervical Screen AROM: WFL and painless with overpressure in all planes Spurlings A (ipsilateral lateral flexion/axial compression): R: Negative L: Not examined Spurlings B (ipsilateral lateral flexion/contralateral rotation/axial compression): R: Not examined L: Not examined Repeated movement:Deferred a   AROM AROM (Normal range in degrees) AROM/PROM  Cervical  Flexion (50) WNL  Extension (80) WNL  Right lateral flexion (45) WNL  Left lateral flexion (45) WNL  Right rotation (85) WNL  Left rotation (85) WNL   Right Left  Shoulder    Flexion 180 180  Extension    Abduction 90/180 180  External Rotation 90   Internal Rotation 70/85   Hands Behind Head WNL WNL  Hands Behind Back WNL WNl      Elbow    Flexion WNL WNL  Extension WNL WNL  Pronation WNL WNL  Supination WNL WNl  (* = pain; Blank rows = not tested)  UE MMT: MMT (out of 5) Right Left   Cervical (isometric)  Flexion WNL  Extension WNL  Lateral Flexion WNL WNL  Rotation WNL WNL      Shoulder   Flexion 3+ 3+  Extension    Abduction 3+ 4  External rotation 4- 4  Internal rotation 4- 4  Horizontal abduction    Horizontal adduction    Lower Trapezius    Rhomboids        Elbow  Flexion 5 5  Extension 5 5  Pronation 5 5  Supination 5 5      Wrist  Flexion     Extension    Radial deviation    Ulnar deviation        MCP  Flexion    Extension    Abduction    Adduction    (* = pain; Blank rows = not tested)  Sensation Grossly intact to light touch bilateral UE as determined by testing dermatomes C2-T2. Proprioception and hot/cold testing deferred on this date.  Reflexes Deferred   Palpation Location LEFT  RIGHT           Subocciptials    Cervical paraspinals 1 1  Upper Trapezius 1 1  Levator Scapulae    Rhomboid Major/Minor    Sternoclavicular joint    Acromioclavicular joint    Coracoid process    Long head of biceps    Supraspinatus 1 1  Infraspinatus 2 2  Subscapularis 1 1  Teres Minor    Teres Major    Pectoralis Major 1 1  Pectoralis Minor 1 1  Anterior Deltoid    Lateral Deltoid 1 1  Posterior Deltoid 1 1  Latissimus Dorsi    Sternocleidomastoid    (Blank rows = not tested) Graded on 0-4 scale (0 = no pain, 1 = pain, 2 = pain with wincing/grimacing/flinching, 3 = pain with withdrawal, 4 = unwilling to allow palpation), (Blank rows = not tested)   Passive Accessory Intervertebral Motion Deferred  Accessory Motions/Glides Glenohumeral: Posterior: R: normal L: abnormal Inferior: R: normal L: abnormal Anterior: R: normal L: not examined   SPECIAL TESTS Rotator Cuff  Drop Arm Test: Negative Painful Arc (Pain from 60 to 120 degrees scaption): Positive Infraspinatus Muscle Test: Positive If all 3 tests positive, the probability of a full-thickness rotator cuff tear is 91%   Bicep Tendon Pathology Speed (shoulder flexion to 90, external rotation, full elbow extension, and forearm supination with resistance: Negative Yergason's (resisted shoulder ER and supination/biceps tendon pathology): Negative  TODAY'S TREATMENT  12/26/2023:  Therapeutic Exercise:   PT Demo with return demonstration from patient of initial HEP:  - multi modal cues provided in order to reduce compensation from parascapular muscles  during GHJ ER/IR  Exercises - Seated Shoulder Abduction AAROM with Pulley at Side  - 1 x daily - 7 x weekly - 3 sets - 20 reps - Standing Shoulder Flexion AAROM with Arms Bent and Pulley Behind  - 1 x daily - 7 x weekly - 3 sets - 20 reps - Seated Single Arm Shoulder External Rotation with Towel  - 1 x daily - 3-4 x weekly - 2-3 sets - 10 reps - Supine Shoulder External and Internal Rotation in Abduction with Dumbbell  - 1 x daily - 3-4 x weekly - 2-3 sets - 10 reps     PATIENT EDUCATION:  Education details: POC, HEP, Prognosis Person educated: Patient Education method: Explanation, Demonstration, and Handouts Education comprehension: verbalized understanding and returned demonstration   HOME EXERCISE PROGRAM:   Access Code: [email or web address] URL: [email or web address] Date: 12/26/2023 Prepared by: Satira Curet  Exercises - Seated Shoulder Abduction AAROM with Pulley at Side  - 1 x daily - 7 x weekly - 3 sets - 20 reps - Standing Shoulder  Flexion AAROM with Arms Bent and Pulley Behind  - 1 x daily - 7 x weekly - 3 sets - 20 reps - Seated Single Arm Shoulder External Rotation with Towel  - 1 x daily - 3-4 x weekly - 2-3 sets - 10 reps - Supine Shoulder External and Internal Rotation in Abduction with Dumbbell  - 1 x daily - 3-4 x weekly - 2-3 sets - 10 reps  ASSESSMENT:  CLINICAL IMPRESSION: Patient is a 73 y.o. Female who was seen today for physical therapy evaluation and treatment for bilateral shoulder pain. Patient demonstrates impairments with bilateral shoulder AROM, weakness and pain. Patient demonstrates shoulder shrug sign with R GHJ abduction and pain past 90. Severe weakness in R external rotation, abduction and flexion against resistance. Lifting objects <10 lbs with R arm is severely limited and she compensates with L arm in order to achieve tasks. Her deficits are limiting her ability to perform overhead household ADLs and full participation with recreational  tasks. TTP along the rotator cuff musculature and upper neck.  Patient will benefit from skilled PT services to address listed impairments to improve quality of life and ability to complete ADLs and iADLs with less pain.    OBJECTIVE IMPAIRMENTS: decreased endurance, decreased mobility, decreased ROM, decreased strength, hypomobility, increased muscle spasms, and pain.   ACTIVITY LIMITATIONS: carrying, lifting, sleeping, reach over head, and hygiene/grooming  PARTICIPATION LIMITATIONS: meal prep, cleaning, and laundry  PERSONAL FACTORS: Age, Behavior pattern, Past/current experiences, Time since onset of injury/illness/exacerbation, and 1-2 comorbidities: s/p Capsulectomy, HTN are also affecting patient's functional outcome.   REHAB POTENTIAL: Fair Patient with significant weakness in R shoulder in abduction and flexion with visible shoulder shrug sign. L GHJ developing rotator cuff pathologies due to compensating for R sided weakness.   CLINICAL DECISION MAKING: Evolving/moderate complexity  EVALUATION COMPLEXITY: Moderate   GOALS: Goals reviewed with patient? Yes  SHORT TERM GOALS: Target date: 01/24/2024   Pt will be independent with HEP to improve strength and decrease shoulder pain to improve pain-free function at home and work. Baseline: Initial HEP provided Goal status: INITIAL   LONG TERM GOALS: Target date: 02/21/2024   Pt will increase R Shoulder abd to 110 without scapular elevation in order to demonstrate significant improvements in shoulder functional and overheads reaching.  Baseline: 12/27/2023: ~90 Goal status: INITIAL  2.  Pt will decrease worst shoulder pain by at least 3 points on the NPRS in order to demonstrate clinically significant reduction in shoulder pain. Baseline: 12/27/2023: 10/10 NPS Goal status: INITIAL  3.  Pt will decrease quick DASH score by at least 8% in order to demonstrate clinically significant reduction in disability related to shoulder  pain        Baseline: Deferred to next visit Goal status: INITIAL  4. Pt will increase R shoulder flex/abd, IR/ER strength by at least 1/2 MMT grade in order to demonstrate improvement in strength and function Baseline:  Flexion 3+ 3+  Extension    Abduction 3+ 4  External rotation 4- 4  Internal rotation 4- 4   Goal status: INITIAL   PLAN: PT FREQUENCY: 1-2x/week  PT DURATION: 8 weeks  PLANNED INTERVENTIONS: Therapeutic exercises, Therapeutic activity, Neuromuscular re-education, Balance training, Gait training, Patient/Family education, Self Care, Joint mobilization, Joint manipulation, Vestibular training, Canalith repositioning, Orthotic/Fit training, DME instructions, Dry Needling, Electrical stimulation, Spinal manipulation, Spinal mobilization, Cryotherapy, Moist heat, Taping, Traction, Ultrasound, Ionotophoresis 4mg /ml Dexamethasone , Manual therapy, and Re-evaluation.  PLAN FOR NEXT SESSION: Review HEP, Initiate Shoulder  strengthening, rotator cuff strength, parascapular strength   Satira Curet PT, DPT Physical Therapist- [place]  12/27/2023, 10:24 AM

## 2023-12-27 ENCOUNTER — Ambulatory Visit: Admitting: Student

## 2023-12-27 ENCOUNTER — Encounter: Payer: Self-pay | Admitting: Student

## 2023-12-27 VITALS — BP 171/94 | HR 72 | Ht 63.0 in | Wt 162.1 lb

## 2023-12-27 DIAGNOSIS — Z9889 Other specified postprocedural states: Secondary | ICD-10-CM

## 2023-12-27 NOTE — Progress Notes (Signed)
 Patient is a 73 year old female with history of breast cancer. She most recently underwent excision of right breast capsule contracture and right breast tissue, advancement of inferior right chest and liposuction of the lateral right chest with Dr. Orin Birk on 11/18/2023. She is almost 6 weeks postop. She presents to the clinic today for postoperative follow up.      Patient was last seen in the clinic on 12/05/2023.  At this visit, patient was doing well.  On exam, there are no fluid collection palpated on exam.  No overlying skin changes to the right lateral chest.  Incision was intact and healing well.  Physical therapy was ordered for the patient given limited range of motion.  Today, she reports she is doing well.  She states that the surgical site has healed up well.  She denies any fevers, chills or drainage from the area.  She denies any issues or concerns with the surgical site.  Chaperone present on exam.  On exam, patient is sitting upright in no acute distress.  Surgical site is well-healed.  There is no overlying erythema.  No fluid collections palpated on exam.  There does appear to be a Monocryl suture protruding from one of the incisions.  This was removed without any difficulty.  Patient tolerated well.  There are no signs of infection on exam.  I discussed with the patient that she may start using scar creams on her incisions if she would like.  Discussed with the patient that she no longer has to wear her compression bra and may start increasing her activities.  Patient expressed understanding.  Patient's blood pressure is elevated in clinic today.  She states that she was just over at the hospital visiting a friend and was stressed from the parking lot.  She also states that she is in pain to her left shoulder, which she states she is undergoing surgery for soon.  She denies any headaches, changes in her vision, chest pain or shortness of breath.  She does state that she has a blood  pressure cuff at home.  I recommended that she retake her blood pressure when she goes home.  Recommended that she reach out to her primary care if it still remains elevated.  Patient states that she does have an appointment coming up with her primary care soon.  Patient to follow-up as needed.  Instructed her to call if she has any questions or concerns about anything.  Pictures were obtained of the patient and placed in the chart with the patient's or guardian's permission.

## 2023-12-30 ENCOUNTER — Telehealth: Payer: Self-pay

## 2023-12-30 DIAGNOSIS — H9313 Tinnitus, bilateral: Secondary | ICD-10-CM | POA: Diagnosis not present

## 2023-12-30 DIAGNOSIS — M26622 Arthralgia of left temporomandibular joint: Secondary | ICD-10-CM | POA: Diagnosis not present

## 2023-12-30 DIAGNOSIS — H903 Sensorineural hearing loss, bilateral: Secondary | ICD-10-CM | POA: Diagnosis not present

## 2023-12-30 NOTE — Telephone Encounter (Signed)
 Phone call to patient regarding advise on AAROM for bilateral shoulders. She is unable to place pulley on the door. PT advised on additional exercises with use of AD or dowel. No further questions or concerns.   Satira Curet PT, DPT Physical Therapist- [place]

## 2023-12-30 NOTE — Telephone Encounter (Signed)
 Patient with diagnosis of afib on Eliquis  for anticoagulation.    Procedure: Left Reverse Total Shoulder Arthroplasty  Date of procedure: TBD   CHA2DS2-VASc Score = 3   This indicates a 3.2% annual risk of stroke. The patient's score is based upon: CHF History: 0 HTN History: 1 Diabetes History: 0 Stroke History: 0 Vascular Disease History: 0 Age Score: 1 Gender Score: 1      CrCl 56 ml/min Platelet count 218  Patient has not had an Afib/aflutter ablation within the last 3 months or DCCV within the last 30 days  Per office protocol, patient can hold Eliquis  for 3 days prior to procedure.    **This guidance is not considered finalized until pre-operative APP has relayed final recommendations.**

## 2023-12-30 NOTE — Telephone Encounter (Signed)
   Patient Name: Krystal [name]  DOB: 04-30-1951 MRN: [id number]  Primary Cardiologist: Antionette Kirks, MD  Clinical pharmacists have reviewed the patient's past medical history, labs, and current medications as part of preoperative protocol coverage. The following recommendations have been made:   Patient has not had an Afib/aflutter ablation within the last 3 months or DCCV within the last 30 days   Per office protocol, patient can hold Eliquis  for 3 days prior to procedure.     I will route this recommendation to the requesting party via Epic fax function and remove from pre-op pool.  Please call with questions.  Ava Boatman, NP 12/30/2023, 2:23 PM

## 2024-01-01 ENCOUNTER — Ambulatory Visit

## 2024-01-01 DIAGNOSIS — M6281 Muscle weakness (generalized): Secondary | ICD-10-CM

## 2024-01-01 DIAGNOSIS — M25511 Pain in right shoulder: Secondary | ICD-10-CM | POA: Diagnosis not present

## 2024-01-01 DIAGNOSIS — G8929 Other chronic pain: Secondary | ICD-10-CM | POA: Diagnosis not present

## 2024-01-01 DIAGNOSIS — M25612 Stiffness of left shoulder, not elsewhere classified: Secondary | ICD-10-CM | POA: Diagnosis not present

## 2024-01-01 DIAGNOSIS — M25512 Pain in left shoulder: Secondary | ICD-10-CM | POA: Diagnosis not present

## 2024-01-01 NOTE — Therapy (Signed)
 OUTPATIENT PHYSICAL THERAPY SHOULDER/ELBOW TREATMENT  Patient Name: Krystal [name] MRN: [id number] DOB:1950-12-26, 73 y.o., female Today's Date: 01/01/2024  END OF SESSION:  PT End of Session - 01/01/24 1439     Visit Number 2    Number of Visits 17    Date for PT Re-Evaluation 02/21/24    PT Start Time 1435    PT Stop Time 1515    PT Time Calculation (min) 40 min    Activity Tolerance Patient tolerated treatment well    Behavior During Therapy [facility] for tasks assessed/performed             Past Medical History:  Diagnosis Date   Anemia    Arthritis    osteo   Blindness of right eye at birth   Cancer [facility]) 2011   Left breast 2011 and cervical age 41   Carpal tunnel syndrome    Cataract    Depression    Edema 07/27/2011   Fibromyalgia    muscle weakness and pain   GERD (gastroesophageal reflux disease)    Hyperlipidemia    Hypertension    benign   Lymphedema    Osteoporosis    Plantar fasciitis    Pre-diabetes    Raynaud's disease    Restless leg syndrome    Rosacea    Rotator cuff rupture    Sleep apnea    Synovitis    Ulcer    Past Surgical History:  Procedure Laterality Date   ABDOMINAL HYSTERECTOMY  1986   For bleeding and pain   ATRIAL FIBRILLATION ABLATION N/A 05/14/2023   Procedure: ATRIAL FIBRILLATION ABLATION;  Surgeon: Boyce Byes, MD;  Location: [facility];  Service: Cardiovascular;  Laterality: N/A;   BLADDER SURGERY  2006   Bladder tack   BREAST RECONSTRUCTION Bilateral 09/17/2012   Procedure: BREAST RECONSTRUCTION;  Surgeon: Marilou Showman, DO;  Location: [facility];  Service: Plastics;  Laterality: Bilateral;  BILATERAL BREAST RECONSTRUCTION WITH TISSUE EXPANDERS AND ALLOMED   BREAST RECONSTRUCTION Right 06/03/2013   Procedure: RIGHT BREAST CAPSULE CONSTRACTURE;  Surgeon: Marilou Showman, DO;  Location: [facility];  Service: Plastics;  Laterality: Right;   CAPSULECTOMY Right 11/18/2023   Procedure:  EXCISION OF RIGHT BREAST CAPSULE CONTRACTURE AND BREAST TISSUE;  Surgeon: Thornell Flirt, DO;  Location: [facility];  Service: Plastics;  Laterality: Right;   CARDIOVERSION N/A 08/06/2022   Procedure: CARDIOVERSION;  Surgeon: Wenona Hamilton, MD;  Location: [facility];  Service: Cardiovascular;  Laterality: N/A;   CARDIOVERSION N/A 10/02/2022   Procedure: CARDIOVERSION;  Surgeon: Sammy Crisp, MD;  Location: [facility];  Service: Cardiovascular;  Laterality: N/A;   CARPAL TUNNEL RELEASE Bilateral 2008   EYE SURGERY Right    INJECTION KNEE Left 08/12/2018   Procedure: KNEE INJECTION-LEFT;  Surgeon: Elner Hahn, MD;  Location: [facility];  Service: Orthopedics;  Laterality: Left;   JOINT REPLACEMENT     LIPOSUCTION Bilateral 01/29/2013   Procedure: LIPOSUCTION;  Surgeon: Marilou Showman, DO;  Location: [facility];  Service: Plastics;  Laterality: Bilateral;   LIPOSUCTION Right 06/03/2013   Procedure: LIPOSUCTION;  Surgeon: Marilou Showman, DO;  Location: [facility];  Service: Plastics;  Laterality: Right;   LIPOSUCTION Right 11/18/2023   Procedure: LIPOSUCTION LATERAL RIGHT CHEST;  Surgeon: Thornell Flirt, DO;  Location:  SURGERY CENTER;  Service: Plastics;  Laterality: Right;   MASTECTOMY  2012   rt prophalactic mast-snbx   MASTECTOMY MODIFIED  RADICAL  2012   left-axillary nodes   NEUROMA SURGERY Bilateral    NOSE SURGERY  2007   PORT-A-CATH REMOVAL     insertion and   REVISION, RECONSTRUCTION, BREAST Right 11/18/2023   Procedure: REVISION RIGHT BREAST RECONSTRUCTION;  Surgeon: Thornell Flirt, DO;  Location: [facility];  Service: Plastics;  Laterality: Right;  Excision of scar, capsule contracture right breast , possible liposuction   THROAT SURGERY     TONSILLECTOMY     TOTAL KNEE ARTHROPLASTY Right 08/12/2018   Procedure: TOTAL KNEE ARTHROPLASTY-RIGHT;  Surgeon: Elner Hahn, MD;  Location: [facility];   Service: Orthopedics;  Laterality: Right;   TOTAL KNEE ARTHROPLASTY Left 09/22/2019   Procedure: TOTAL KNEE ARTHROPLASTY;  Surgeon: Elner Hahn, MD;  Location: [facility];  Service: Orthopedics;  Laterality: Left;   UVULOPALATOPHARYNGOPLASTY     Patient Active Problem List   Diagnosis Date Noted   Raynaud's disease 11/05/2022   Osteoporosis, post-menopausal 11/05/2022   Atrial flutter (HCC) 10/02/2022   First degree AV block 09/11/2022   Arthritis 08/08/2022   Persistent atrial fibrillation (HCC) 08/06/2022   Depression 02/27/2022   Gastroesophageal reflux disease 02/27/2022   Hypertension 02/27/2022   Abnormal LFTs 02/27/2022   Alcohol  abuse 02/27/2022   Atrial fibrillation with RVR (HCC) 02/27/2022   Morbid obesity (HCC) 02/27/2022   Pes anserinus bursitis of left knee 02/08/2020   Status post total knee replacement using cement, left 09/22/2019   Status post total knee replacement using cement, right 08/12/2018   Rotator cuff rupture    Status post bilateral breast reconstruction 02/06/2013   Acquired absence of bilateral breasts and nipples 09/17/2012   Breast cancer of lower-outer quadrant of left female breast (HCC) 01/16/2011    PCP: Lyle San, MD   REFERRING PROVIDER:  Harden Leyden, PA-C   REFERRING DIAG:  M75.101 (ICD-10-CM) - Unspecified rotator cuff tear or rupture of right shoulder, not specified as traumatic    RATIONALE FOR EVALUATION AND TREATMENT: Rehabilitation  THERAPY DIAG: Stiffness of left shoulder, not elsewhere classified  Muscle weakness (generalized)  Chronic pain of both shoulders  ONSET DATE: 11/18/2023  FOLLOW-UP APPT SCHEDULED WITH REFERRING PROVIDER: Yes    SUBJECTIVE:                                                                                                                                                                                         SUBJECTIVE STATEMENT:    Patient with chief concern of bilateral shoulder  pain and limited ROM.   PERTINENT HISTORY:   Patient is a 73 year old female with history of breast cancer. She most recently  underwent excision of right breast capsule contracture and right breast tissue, advancement of inferior right chest and liposuction of the lateral right chest with Dr. Orin Birk on 11/18/2023. She she denies any fevers or chills.  No stitches or soreness along the R breast tissue/shoulder. She denies any drainage. She also reports that she has been having some difficulties with range of motion of her right upper extremity.   Pt reports that she has difficulties with lifting a gallon of milk to a top shelf with her RUE; she has to use her L arm to assist her R arm. She reports pain in both arms but R > L. Her L arm is starting to develop pain due to compensating for the R arm. Denies n/t.   She has lifting restrictions, no greater than 10 pounds for 6 weeks postoperatively. (12/30/2023)   Dominant hand: right  PAIN:  Pain Intensity: Present: 1/10, Best: 3/10, Worst: 10/10 Pain location: Bilateral Shoulder  Pain Quality: constant, sharp, and aching  Radiating: Yes  Numbness/Tingling: No Focal Weakness: Yes Aggravating factors: Repetitive Overhead Movements, Laying on R shoulder,  Relieving factors: Ice Modalities 24-hour pain behavior: Activity Dependent History of prior shoulder or neck/shoulder injury, pain, surgery, or therapy: Yes Imaging: Yes  Red flags (personal history of cancer, chills/fever, night sweats, nausea, vomiting, unrelenting pain, unexplained weight gain/loss): Negative  PRECAUTIONS: None  WEIGHT BEARING RESTRICTIONS: Yes She has lifting restrictions, no greater than 10 pounds for 6 weeks postoperatively.  FALLS: Has patient fallen in last 6 months? No  Living Environment Lives with: lives with their family Lives in: House/apartment Stairs: Yes: Internal: 13 steps; on right going up and External: 5 steps; can reach both Has following  equipment at home: None  Prior level of function: Independent  Occupational demands: Retired   Hobbies: Dog (92 yr old)  Patient Goals: Movement and less pain    OBJECTIVE:   Patient Surveys  FOTO: QuickDASH:  Cognition Patient is oriented to person, place, and time.  Recent memory is intact.  Remote memory is intact.  Attention span and concentration are intact.  Expressive speech is intact.  Patient's fund of knowledge is within normal limits for educational level.    Gross Musculoskeletal Assessment Tremor: None Bulk: Normal Tone: Normal   Posture Seated: Forward, Rounded Shoulder, increase thoracic kyphosis  Cervical Screen AROM: WFL and painless with overpressure in all planes Spurlings A (ipsilateral lateral flexion/axial compression): R: Negative L: Not examined Spurlings B (ipsilateral lateral flexion/contralateral rotation/axial compression): R: Not examined L: Not examined Repeated movement:Deferred a   AROM AROM (Normal range in degrees) AROM/PROM  Cervical  Flexion (50) WNL  Extension (80) WNL  Right lateral flexion (45) WNL  Left lateral flexion (45) WNL  Right rotation (85) WNL  Left rotation (85) WNL   Right Left  Shoulder    Flexion 180 180  Extension    Abduction 90/180 180  External Rotation 90   Internal Rotation 70/85   Hands Behind Head WNL WNL  Hands Behind Back WNL WNl      Elbow    Flexion WNL WNL  Extension WNL WNL  Pronation WNL WNL  Supination WNL WNl  (* = pain; Blank rows = not tested)  UE MMT: MMT (out of 5) Right Left   Cervical (isometric)  Flexion WNL  Extension WNL  Lateral Flexion WNL WNL  Rotation WNL WNL      Shoulder   Flexion 3+ 3+  Extension    Abduction 3+ 4  External rotation 4- 4  Internal rotation 4- 4  Horizontal abduction    Horizontal adduction    Lower Trapezius    Rhomboids        Elbow  Flexion 5 5  Extension 5 5  Pronation 5 5  Supination 5 5      Wrist  Flexion     Extension    Radial deviation    Ulnar deviation        MCP  Flexion    Extension    Abduction    Adduction    (* = pain; Blank rows = not tested)  Sensation Grossly intact to light touch bilateral UE as determined by testing dermatomes C2-T2. Proprioception and hot/cold testing deferred on this date.  Reflexes Deferred   Palpation Location LEFT  RIGHT           Subocciptials    Cervical paraspinals 1 1  Upper Trapezius 1 1  Levator Scapulae    Rhomboid Major/Minor    Sternoclavicular joint    Acromioclavicular joint    Coracoid process    Long head of biceps    Supraspinatus 1 1  Infraspinatus 2 2  Subscapularis 1 1  Teres Minor    Teres Major    Pectoralis Major 1 1  Pectoralis Minor 1 1  Anterior Deltoid    Lateral Deltoid 1 1  Posterior Deltoid 1 1  Latissimus Dorsi    Sternocleidomastoid    (Blank rows = not tested) Graded on 0-4 scale (0 = no pain, 1 = pain, 2 = pain with wincing/grimacing/flinching, 3 = pain with withdrawal, 4 = unwilling to allow palpation), (Blank rows = not tested)   Passive Accessory Intervertebral Motion Deferred  Accessory Motions/Glides Glenohumeral: Posterior: R: normal L: abnormal Inferior: R: normal L: abnormal Anterior: R: normal L: not examined   SPECIAL TESTS Rotator Cuff  Drop Arm Test: Negative Painful Arc (Pain from 60 to 120 degrees scaption): Positive Infraspinatus Muscle Test: Positive If all 3 tests positive, the probability of a full-thickness rotator cuff tear is 91%   Bicep Tendon Pathology Speed (shoulder flexion to 90, external rotation, full elbow extension, and forearm supination with resistance: Negative Yergason's (resisted shoulder ER and supination/biceps tendon pathology): Negative  TODAY'S TREATMENT  01/01/2024:  Therapeutic Exercise:    UBE x 5 min (2.5 fwd/retro dir) x level 8 for UE muscle warm up, strength and endurance  Seated GHJ Abduction:   3 x 10 3# DB ea UE   - within  available range, scapular elevation in RUE with abd past 40 due to weakness     Seated GHJ Flexion   2 x 10 RUE AROM (0-40)    2 x 10 LUE 2# DB    Standing ER Isometric against door frame    R/L: 3 x 10s   - multi modal cues in order reduce compensation and target rotator cuff musulature.     Therapeutic Activity:    AAROM Wall Slides into GHJ Flexion 0-150 with 2# AW around wrist   2 x 10 ea UE     AAROM Wall Slides into GHJ Abduction AAROM    2 x 10 ea UE  PT education and demo on pulley placement on door in order to improve AROM. Pt verbalized understanding. She requires door hook in order for pulley to be properly placed into door treshhold.   PATIENT EDUCATION:  Education details: POC, HEP, Prognosis Person educated: Patient Education method: Programmer, multimedia, Facilities manager, and Handouts Education  comprehension: verbalized understanding and returned demonstration   HOME EXERCISE PROGRAM:  Access Code: [email or web address] URL: [email or web address] Date: 01/01/2024 Prepared by: Veryl Gottron Tanveer Dobberstein  Exercises - Seated Shoulder Flexion Towel Slide at Table Top  - 1 x daily - 7 x weekly - 3 sets - 10 reps - Seated Shoulder Abduction AAROM with Pulley at Side  - 1 x daily - 7 x weekly - 3 sets - 20 reps - Standing Shoulder Flexion AAROM with Arms Bent and Pulley Behind  - 1 x daily - 7 x weekly - 3 sets - 20 reps - Seated Single Arm Shoulder External Rotation with Towel  - 1 x daily - 3-4 x weekly - 2-3 sets - 10 reps - Supine Shoulder External and Internal Rotation in Abduction with Dumbbell  - 1 x daily - 3-4 x weekly - 2-3 sets - 10 reps - Standing Isometric Shoulder External Rotation with Doorway and Towel Roll  - 1 x daily - 3-4 x weekly - 3 sets - 10s hold - Standing Single Arm Shoulder Abduction with Dumbbell - Palm Down  - 1 x daily - 3-4 x weekly - 2-3 sets - 10 reps   Access Code: [email or web address] URL: [email or web address] Date: 12/26/2023 Prepared by:  Satira Curet  Exercises - Seated Shoulder Abduction AAROM with Pulley at Side  - 1 x daily - 7 x weekly - 3 sets - 20 reps - Standing Shoulder Flexion AAROM with Arms Bent and Pulley Behind  - 1 x daily - 7 x weekly - 3 sets - 20 reps - Seated Single Arm Shoulder External Rotation with Towel  - 1 x daily - 3-4 x weekly - 2-3 sets - 10 reps - Supine Shoulder External and Internal Rotation in Abduction with Dumbbell  - 1 x daily - 3-4 x weekly - 2-3 sets - 10 reps  ASSESSMENT:  CLINICAL IMPRESSION: Patient arriving to OPPT for first f/u in management of bilateral shoulder weakness. Patient presenting with scapular elevation with GHJ abductions exercises. Heavy multimodal cueing utilized in order for proper sequencing and technique. PT education on proper pulley placement in doorway for AAROM exercises. PT discussed possible expectations for AROM with reverse total shoulder replacement surgery. She presents with severe weakness bilaterally in rotator cuff musculature requiring various compensation techniques. Her deficits limit her full ability to perform weighted overhead ADLs and recreational activities. Based on today's performance, pt will continue to benefit from skilled PT in order to facilitate return to PLOF and improve QoL.      OBJECTIVE IMPAIRMENTS: decreased endurance, decreased mobility, decreased ROM, decreased strength, hypomobility, increased muscle spasms, and pain.   ACTIVITY LIMITATIONS: carrying, lifting, sleeping, reach over head, and hygiene/grooming  PARTICIPATION LIMITATIONS: meal prep, cleaning, and laundry  PERSONAL FACTORS: Age, Behavior pattern, Past/current experiences, Time since onset of injury/illness/exacerbation, and 1-2 comorbidities: s/p Capsulectomy, HTN are also affecting patient's functional outcome.   REHAB POTENTIAL: Fair Patient with significant weakness in R shoulder in abduction and flexion with visible shoulder shrug sign. L GHJ developing rotator  cuff pathologies due to compensating for R sided weakness.   CLINICAL DECISION MAKING: Evolving/moderate complexity  EVALUATION COMPLEXITY: Moderate   GOALS: Goals reviewed with patient? Yes  SHORT TERM GOALS: Target date: 01/29/2024   Pt will be independent with HEP to improve strength and decrease shoulder pain to improve pain-free function at home and work. Baseline: Initial HEP provided Goal status: INITIAL   LONG TERM GOALS: Target date: 02/26/2024   Pt will increase R Shoulder  abd to 110 without scapular elevation in order to demonstrate significant improvements in shoulder functional and overheads reaching.  Baseline: 12/27/2023: ~90 Goal status: INITIAL  2.  Pt will decrease worst shoulder pain by at least 3 points on the NPRS in order to demonstrate clinically significant reduction in shoulder pain. Baseline: 12/27/2023: 10/10 NPS Goal status: INITIAL  3.  Pt will decrease quick DASH score by at least 8% in order to demonstrate clinically significant reduction in disability related to shoulder pain        Baseline: Deferred to next visit Goal status: INITIAL  4. Pt will increase R shoulder flex/abd, IR/ER strength by at least 1/2 MMT grade in order to demonstrate improvement in strength and function Baseline:  Flexion 3+ 3+  Extension    Abduction 3+ 4  External rotation 4- 4  Internal rotation 4- 4   Goal status: INITIAL   PLAN: PT FREQUENCY: 1-2x/week  PT DURATION: 8 weeks  PLANNED INTERVENTIONS: Therapeutic exercises, Therapeutic activity, Neuromuscular re-education, Balance training, Gait training, Patient/Family education, Self Care, Joint mobilization, Joint manipulation, Vestibular training, Canalith repositioning, Orthotic/Fit training, DME instructions, Dry Needling, Electrical stimulation, Spinal manipulation, Spinal mobilization, Cryotherapy, Moist heat, Taping, Traction, Ultrasound, Ionotophoresis 4mg /ml Dexamethasone , Manual therapy, and  Re-evaluation.  PLAN FOR NEXT SESSION: Review HEP, Initiate Shoulder strengthening, rotator cuff strength, parascapular strength   Satira Curet PT, DPT Physical Therapist- [place]  01/01/2024, 2:43 PM

## 2024-01-06 DIAGNOSIS — H2512 Age-related nuclear cataract, left eye: Secondary | ICD-10-CM | POA: Diagnosis not present

## 2024-01-06 DIAGNOSIS — H53001 Unspecified amblyopia, right eye: Secondary | ICD-10-CM | POA: Diagnosis not present

## 2024-01-06 DIAGNOSIS — H01003 Unspecified blepharitis right eye, unspecified eyelid: Secondary | ICD-10-CM | POA: Diagnosis not present

## 2024-01-06 DIAGNOSIS — Z0100 Encounter for examination of eyes and vision without abnormal findings: Secondary | ICD-10-CM | POA: Diagnosis not present

## 2024-01-06 DIAGNOSIS — H02829 Cysts of unspecified eye, unspecified eyelid: Secondary | ICD-10-CM | POA: Diagnosis not present

## 2024-01-08 ENCOUNTER — Other Ambulatory Visit: Payer: Self-pay

## 2024-01-08 MED ORDER — APIXABAN 5 MG PO TABS
5.0000 mg | ORAL_TABLET | Freq: Two times a day (BID) | ORAL | 5 refills | Status: DC
Start: 2024-01-08 — End: 2024-05-04

## 2024-01-08 NOTE — Telephone Encounter (Signed)
 Prescription refill request for Eliquis  received. Indication:afib Last office visit:1/25 Scr:0.89  4/25 Age: 73 Weight:73.5  kg  Prescription refilled

## 2024-01-10 ENCOUNTER — Ambulatory Visit: Attending: Medical | Admitting: Medical

## 2024-01-10 VITALS — BP 170/96 | HR 69 | Ht 63.0 in | Wt 162.4 lb

## 2024-01-10 DIAGNOSIS — Z01818 Encounter for other preprocedural examination: Secondary | ICD-10-CM

## 2024-01-10 DIAGNOSIS — Z8679 Personal history of other diseases of the circulatory system: Secondary | ICD-10-CM

## 2024-01-10 DIAGNOSIS — I4892 Unspecified atrial flutter: Secondary | ICD-10-CM | POA: Diagnosis not present

## 2024-01-10 DIAGNOSIS — Z79899 Other long term (current) drug therapy: Secondary | ICD-10-CM | POA: Diagnosis not present

## 2024-01-10 DIAGNOSIS — Z9889 Other specified postprocedural states: Secondary | ICD-10-CM

## 2024-01-10 DIAGNOSIS — I480 Paroxysmal atrial fibrillation: Secondary | ICD-10-CM | POA: Diagnosis not present

## 2024-01-10 DIAGNOSIS — G4733 Obstructive sleep apnea (adult) (pediatric): Secondary | ICD-10-CM

## 2024-01-10 DIAGNOSIS — I10 Essential (primary) hypertension: Secondary | ICD-10-CM

## 2024-01-10 MED ORDER — LOSARTAN POTASSIUM 25 MG PO TABS: 25.0000 mg | ORAL_TABLET | Freq: Every day | ORAL | 3 refills | Status: DC

## 2024-01-10 MED ORDER — LOSARTAN POTASSIUM 25 MG PO TABS: 25.0000 mg | ORAL_TABLET | Freq: Every day | ORAL | 0 refills | Status: DC

## 2024-01-10 NOTE — Progress Notes (Signed)
 Cardiology Office Note   Date:  01/10/2024  ID:  Krystal [name], Krystal [name] [birth year], MRN [id number] PCP: Lyle San, MD  [facility] Providers Cardiologist:  Antionette Kirks, MD Electrophysiologist:  Boyce Byes, MD    History of Present Illness Krystal [name] is a 73 y.o. female with a h/o persistent Afib, HTN, OSA who is being seen for follow-up for Afib and pre-op evaluation.   The patient underwent AF ablation 05/14/23. She was seen in follow-up 06/11/23 and denied recurrences.   Today, the patient reports she is needing left shoulder surgery. No injuries. It's bone on bone. She is scheduled for next week. BP is high today, suspected due to shoulder pain. Patient can walk 1-2 blocks, and up 2 flights of stairs. She walks her dog daily.   Patient has not had afib/flutter ablation in the last 3 months or DCCV within the last 30 days. Per protocol, patient can hold Eiquis 3 days prior to procedure.   Studies Reviewed EKG Interpretation Date/Time:  Friday January 10 2024 15:46:29 EDT Ventricular Rate:  69 PR Interval:  216 QRS Duration:  82 QT Interval:  464 QTC Calculation: 497 R Axis:   -20  Text Interpretation: Sinus rhythm with 1st degree A-V block Anterior infarct (cited on or before 19-Aug-2023) When compared with ECG of 19-Aug-2023 13:37, Questionable change in initial forces of Septal leads Nonspecific T wave abnormality no longer evident in Anterior leads Confirmed by Gennaro Khat, Ananias Kolander ([id number]) on 01/10/2024 3:51:02 PM    Cardiac CTA 05/2023  IMPRESSION: 1. There is normal pulmonary vein drainage into the left atrium. (2 on the right and 2 on the left).   2. The left atrial appendage is a Windsock type with two lobes and ostial size 30 x 22 mm and length 38 mm, Area 52 mm2. There is no thrombus in the left atrial appendage.   3. The esophagus runs in the left atrial midline and is not in the proximity to any of the pulmonary veins.   4. Coronary calcium   score of 0.  Long Term Monitor, 06/21/2022 Patient had a min HR of 56 bpm, max HR of 194 bpm, and avg HR of 90 bpm. Predominant underlying rhythm was Sinus Rhythm. First Degree AV Block was present.  2 wide-complex tachycardia runs occurred, the run with the fastest interval lasting 7 beats with a max rate of 193 bpm (avg 179 bpm); the run with the fastest interval was also the longest.  These could be due to ventricular tachycardia versus SVT with aberrancy. Atrial Fibrillation/Flutter occurred (29% burden), ranging from 56-194 bpm (avg of 105 bpm), the longest lasting 1 day 6 hours with an avg rate of 114 bpm. Atrial Fibrillation was present at activation of device. Atrial Fibrillation/Flutter was detected within +/- 45 seconds of symptomatic patient event(s).  Rare PACs and rare PVCs.   TTE bubble study, 02/27/2022  1. Left ventricular ejection fraction, by estimation, is 65 to 70%. The left ventricle has normal function. The left ventricle has no regional wall motion abnormalities. Left ventricular diastolic parameters were normal.   2. Right ventricular systolic function is normal. The right ventricular size is normal.   3. The mitral valve is normal in structure. Mild to moderate mitral valve regurgitation.   4. Tricuspid valve regurgitation is mild to moderate.   5. The aortic valve is normal in structure. Aortic valve regurgitation is not visualized.   Physical Exam VS:  BP (!) 170/96 (BP Location: Left Arm, Patient  Position: Sitting, Cuff Size: Normal)   Pulse 69   Ht 5' 3 (1.6 m)   Wt 162 lb 6.4 oz (73.7 kg)   SpO2 99%   BMI 28.77 kg/m        Wt Readings from Last 3 Encounters:  01/10/24 162 lb 6.4 oz (73.7 kg)  12/27/23 162 lb 1.6 oz (73.5 kg)  11/18/23 162 lb 0.6 oz (73.5 kg)    GEN: Well nourished, well developed in no acute distress NECK: No JVD; No carotid bruits CARDIAC: RRR, no murmurs, rubs, gallops RESPIRATORY:  Clear to auscultation without rales, wheezing or  rhonchi  ABDOMEN: Soft, non-tender, non-distended EXTREMITIES:  No edema; No deformity   ASSESSMENT AND PLAN  Afib/flutter  S/p ablation 05/14/2023 The patient is maintaining NSR. She is on Eliquis  5mg  BID. OK to hold 3 days prior to surgery. Continue diltiazem  120mg  BID.   HTN BP is high, likely due to pain.  Continue Diltiazem  120mg  BID, cannot increase due to HR in the 60s with 1st degree AV block. I will start Losartan 25mg  daily. May need to increase at follow-up. BMET in 2 weeks.   OSA The patient reports good OSA use.  Pre-op evaluation The patient is stable from a cardiac perspective. Patient has been cleared to hold Eliquis  3 days prior to surgery. METS >4. RCRI =0, 3.9% risk of MACE. No further cardiac work-up prior to surgery.       Dispo: Follow-up in 2 months  Signed, Jeshua Ransford Rebekah Canada, PA-C

## 2024-01-10 NOTE — Patient Instructions (Signed)
 Medication Instructions:   Start Losartan 25 MG daily.  *If you need a refill on your cardiac medications before your next appointment, please call your pharmacy*  Lab Work: Your provider would like for you to return in 2 weeks to have the following labs drawn: BMET.   Please go to [facility] [address] [address], [place] [address] You do not need an appointment.  They are open from 8 am- 4:30 pm.  Lunch from 1:00 pm- 2:00 pm You will not need to be fasting.   If you have labs (blood work) drawn today and your tests are completely normal, you will receive your results only by: MyChart Message (if you have MyChart) OR A paper copy in the mail If you have any lab test that is abnormal or we need to change your treatment, we will call you to review the results.  Testing/Procedures: No test ordered today   Follow-Up: At [facility], you and your health needs are our priority.  As part of our continuing mission to provide you with exceptional heart care, our providers are all part of one team.  This team includes your primary Cardiologist (physician) and Advanced Practice Providers or APPs (Physician Assistants and Nurse Practitioners) who all work together to provide you with the care you need, when you need it.  Your next appointment:   2 month(s)  Provider:   You may see Antionette Kirks, MD or one of the following Advanced Practice Providers on your designated Care Team:   Laneta Pintos, NP Gildardo Labrador, PA-C Varney Gentleman, PA-C Cadence [place], PA-C Ronald Cockayne, NP Morey Ar, NP    We recommend signing up for the patient portal called MyChart.  Sign up information is provided on this After Visit Summary.  MyChart is used to connect with patients for Virtual Visits (Telemedicine).  Patients are able to view lab/test results, encounter notes, upcoming appointments, etc.  Non-urgent messages can be sent to your provider as  well.   To learn more about what you can do with MyChart, go to [email or web address].

## 2024-01-14 NOTE — Patient Instructions (Addendum)
 SURGICAL WAITING ROOM VISITATION Patients having surgery or a procedure may have no more than 2 support people in the waiting area - these visitors may rotate.    Children under the age of 54 must have an adult with them who is not the patient.  If the patient needs to stay at the hospital during part of their recovery, the visitor guidelines for inpatient rooms apply. Pre-op nurse will coordinate an appropriate time for 1 support person to accompany patient in pre-op.  This support person may not rotate.    Please refer to the [facility] website for the visitor guidelines for Inpatients (after your surgery is over and you are in a regular room).       Your procedure is scheduled on: 01-16-24   Report to [facility] Main Entrance    Report to admitting at 5:15 AM   Call this number if you have problems the morning of surgery [phone]   Do not eat food :After Midnight.   After Midnight you may have the following liquids until 4:30 AM DAY OF SURGERY  Water Non-Citrus Juices (without pulp, NO RED-Apple, White grape, White cranberry) Black Coffee (NO MILK/CREAM OR CREAMERS, sugar ok)  Clear Tea (NO MILK/CREAM OR CREAMERS, sugar ok) regular and decaf                             Plain Jell-O (NO RED)                                           Fruit ices (not with fruit pulp, NO RED)                                     Popsicles (NO RED)                                                               Sports drinks like Gatorade (NO RED)                   The day of surgery:  Drink ONE (1) Pre-Surgery Clear Ensure by 4:30 AM the morning of surgery. Drink in one sitting. Do not sip.  This drink was given to you during your hospital  pre-op appointment visit. Nothing else to drink after completing the Pre-Surgery Clear Ensure .          If you have questions, please contact your surgeon's office.   FOLLOW ANY ADDITIONAL PRE OP INSTRUCTIONS YOU RECEIVED FROM YOUR SURGEON'S  OFFICE!!!     Oral Hygiene is also important to reduce your risk of infection.                                    Remember - BRUSH YOUR TEETH THE MORNING OF SURGERY WITH YOUR REGULAR TOOTHPASTE   Do NOT smoke after Midnight   Take these medicines the morning of surgery with A SIP OF WATER:    Diltiazem    Duloxetine  Omeprazole   Pregabalin    Vibegron    Okay to use nasal spray and eyedrops    Stop all vitamins and herbal supplements 7 days before surgery  Hold Eliquis  72 hours prior to surgery (do not take after 01-12-24)  Bring CPAP mask and tubing day of surgery.                              You may not have any metal on your body including hair pins, jewelry, and body piercing             Do not wear make-up, lotions, powders, perfumes or deodorant  Do not wear nail polish including gel and S&S, artificial/acrylic nails, or any other type of covering on natural nails including finger and toenails. If you have artificial nails, gel coating, etc. that needs to be removed by a nail salon please have this removed prior to surgery or surgery may need to be canceled/ delayed if the surgeon/ anesthesia feels like they are unable to be safely monitored.   Do not shave  48 hours prior to surgery.        Do not bring valuables to the hospital. [facility] IS NOT RESPONSIBLE   FOR VALUABLES.   Contacts, dentures or bridgework may not be worn into surgery.   Bring small overnight bag day of surgery.   DO NOT BRING YOUR HOME MEDICATIONS TO THE HOSPITAL. PHARMACY WILL DISPENSE MEDICATIONS LISTED ON YOUR MEDICATION LIST TO YOU DURING YOUR ADMISSION IN THE HOSPITAL!    Patients discharged on the day of surgery will not be allowed to drive home.  Someone NEEDS to stay with you for the first 24 hours after anesthesia.   Special Instructions: Bring a copy of your healthcare power of attorney and living will documents the day of surgery if you haven't scanned them before.              Please  read over the following fact sheets you were given: IF YOU HAVE QUESTIONS ABOUT YOUR PRE-OP INSTRUCTIONS PLEASE CALL [phone]   If you received a COVID test during your pre-op visit  it is requested that you wear a mask when out in public, stay away from anyone that may not be feeling well and notify your surgeon if you develop symptoms. If you test positive for Covid or have been in contact with anyone that has tested positive in the last 10 days please notify you surgeon.  [facility] - Preparing for Surgery Before surgery, you can play an important role.  Because skin is not sterile, your skin needs to be as free of germs as possible.  You can reduce the number of germs on your skin by washing with CHG (chlorahexidine gluconate) soap before surgery.  CHG is an antiseptic cleaner which kills germs and bonds with the skin to continue killing germs even after washing. Please DO NOT use if you have an allergy to CHG or antibacterial soaps.  If your skin becomes reddened/irritated stop using the CHG and inform your nurse when you arrive at Short Stay. Do not shave (including legs and underarms) for at least 48 hours prior to the first CHG shower.  You may shave your face/neck.  Please follow these instructions carefully:  1.  Shower with CHG Soap the night before surgery and the  morning of surgery.  2.  If you choose to wash your hair, wash your hair first  as usual with your normal  shampoo.  3.  After you shampoo, rinse your hair and body thoroughly to remove the shampoo.                             4.  Use CHG as you would any other liquid soap.  You can apply chg directly to the skin and wash.  Gently with a scrungie or clean washcloth.  5.  Apply the CHG Soap to your body ONLY FROM THE NECK DOWN.   Do   not use on face/ open                           Wound or open sores. Avoid contact with eyes, ears mouth and   genitals (private parts).                       Wash face,  Genitals (private  parts) with your normal soap.             6.  Wash thoroughly, paying special attention to the area where your    surgery  will be performed.  7.  Thoroughly rinse your body with warm water from the neck down.  8.  DO NOT shower/wash with your normal soap after using and rinsing off the CHG Soap.                9.  Pat yourself dry with a clean towel.            10.  Wear clean pajamas.            11.  Place clean sheets on your bed the night of your first shower and do not  sleep with pets. Day of Surgery : Do not apply any lotions/deodorants the morning of surgery.  Please wear clean clothes to the hospital/surgery center.  FAILURE TO FOLLOW THESE INSTRUCTIONS MAY RESULT IN THE CANCELLATION OF YOUR SURGERY  PATIENT SIGNATURE_________________________________  NURSE SIGNATURE__________________________________  ________________________________________________________________________

## 2024-01-14 NOTE — Progress Notes (Signed)
 Surgery orders requested via Epic inbox.

## 2024-01-14 NOTE — Progress Notes (Signed)
  Date of COVID positive in last 90 days:  PCP - Lynwood Null, MD Cardiologist - Deatrice Cage, MD Electrophysiologist - Ole Holts, MD  Cardiac clearance in Epic dated 01-10-24  Chest x-ray -  EKG - 01-10-24 Epic Stress Test - 04-30-22 Epic ECHO - 02-27-22 Epic Cardiac Cath -  Pacemaker/ICD device last checked: Spinal Cord Stimulator: Afib ablation 05-14-23 Epic Cardiac CT - 05-08-23 Epic Long Term Monitor - 06-21-22 Epic  Bowel Prep -   Sleep Study - Yes, +sleep apnea CPAP -   Fasting Blood Sugar -  Checks Blood Sugar _____ times a day  Last dose of GLP1 agonist-  N/A GLP1 instructions:  Do not take after     Last dose of SGLT-2 inhibitors-  N/A SGLT-2 instructions:  Do not take after     Blood Thinner Instructions:  Eliquis  (hold x72 hours, do not take after 01-12-24) Last dose:   Time: Aspirin  Instructions: Last Dose:  Activity level:  Can go up a flight of stairs and perform activities of daily living without stopping and without symptoms of chest pain or shortness of breath.  Able to exercise without symptoms  Unable to go up a flight of stairs without symptoms of     Anesthesia review: Afib, HTN  Patient denies shortness of breath, fever, cough and chest pain at PAT appointment  Patient verbalized understanding of instructions that were given to them at the PAT appointment. Patient was also instructed that they will need to review over the PAT instructions again at home before surgery.

## 2024-01-15 ENCOUNTER — Other Ambulatory Visit: Payer: Self-pay

## 2024-01-15 ENCOUNTER — Encounter (HOSPITAL_COMMUNITY)
Admission: RE | Admit: 2024-01-15 | Discharge: 2024-01-15 | Disposition: A | Discharge status: Home / Self Care | Source: Ambulatory Visit | Attending: Orthopedic Surgery | Admitting: Orthopedic Surgery

## 2024-01-15 ENCOUNTER — Ambulatory Visit: Admitting: Cardiology

## 2024-01-15 ENCOUNTER — Encounter (HOSPITAL_COMMUNITY): Payer: Self-pay

## 2024-01-15 VITALS — BP 139/85 | HR 82 | Temp 98.8°F | Resp 16 | Ht 63.0 in | Wt 160.1 lb

## 2024-01-15 DIAGNOSIS — I7300 Raynaud's syndrome without gangrene: Secondary | ICD-10-CM | POA: Diagnosis not present

## 2024-01-15 DIAGNOSIS — M797 Fibromyalgia: Secondary | ICD-10-CM | POA: Diagnosis not present

## 2024-01-15 DIAGNOSIS — Z01812 Encounter for preprocedural laboratory examination: Secondary | ICD-10-CM | POA: Diagnosis not present

## 2024-01-15 DIAGNOSIS — Z79899 Other long term (current) drug therapy: Secondary | ICD-10-CM | POA: Diagnosis not present

## 2024-01-15 DIAGNOSIS — M75102 Unspecified rotator cuff tear or rupture of left shoulder, not specified as traumatic: Secondary | ICD-10-CM | POA: Diagnosis not present

## 2024-01-15 DIAGNOSIS — I2510 Atherosclerotic heart disease of native coronary artery without angina pectoris: Secondary | ICD-10-CM | POA: Insufficient documentation

## 2024-01-15 DIAGNOSIS — G4730 Sleep apnea, unspecified: Secondary | ICD-10-CM | POA: Diagnosis not present

## 2024-01-15 DIAGNOSIS — I10 Essential (primary) hypertension: Secondary | ICD-10-CM | POA: Diagnosis not present

## 2024-01-15 DIAGNOSIS — I081 Rheumatic disorders of both mitral and tricuspid valves: Secondary | ICD-10-CM | POA: Diagnosis not present

## 2024-01-15 DIAGNOSIS — M12812 Other specific arthropathies, not elsewhere classified, left shoulder: Secondary | ICD-10-CM | POA: Diagnosis not present

## 2024-01-15 DIAGNOSIS — Z853 Personal history of malignant neoplasm of breast: Secondary | ICD-10-CM | POA: Diagnosis not present

## 2024-01-15 DIAGNOSIS — Z01818 Encounter for other preprocedural examination: Secondary | ICD-10-CM

## 2024-01-15 LAB — BASIC METABOLIC PANEL WITH GFR
Anion gap: 11 (ref 5–15)
BUN: 18 mg/dL (ref 8–23)
CO2: 25 mmol/L (ref 22–32)
Calcium: 9 mg/dL (ref 8.9–10.3)
Chloride: 103 mmol/L (ref 98–111)
Creatinine, Ser: 0.85 mg/dL (ref 0.44–1.00)
GFR, Estimated: >60 mL/min (ref >60)
Glucose, Bld: 79 mg/dL (ref 70–99)
Potassium: 3.9 mmol/L (ref 3.5–5.1)
Sodium: 139 mmol/L (ref 135–145)

## 2024-01-15 LAB — CBC
HCT: 41.7 % (ref 36.0–46.0)
Hemoglobin: 12.8 g/dL (ref 12.0–15.0)
MCH: 26.2 pg (ref 26.0–34.0)
MCHC: 30.7 g/dL (ref 30.0–36.0)
MCV: 85.3 fL (ref 80.0–100.0)
Platelets: 255 10*3/uL (ref 150–400)
RBC: 4.89 MIL/uL (ref 3.87–5.11)
RDW: 16.6 % — ABNORMAL HIGH (ref 11.5–15.5)
WBC: 5.8 10*3/uL (ref 4.0–10.5)
nRBC: 0 % (ref 0.0–0.2)

## 2024-01-15 LAB — SURGICAL PCR SCREEN
MRSA, PCR: NEGATIVE
Staphylococcus aureus: NEGATIVE

## 2024-01-15 NOTE — Anesthesia Preprocedure Evaluation (Signed)
 Anesthesia Evaluation  Patient identified by MRN, date of birth, ID band Patient awake    Reviewed: Allergy & Precautions, NPO status , Patient's Chart, lab work & pertinent test results, reviewed documented beta blocker date and time   History of Anesthesia Complications Negative for: history of anesthetic complications  Airway Mallampati: II  TM Distance: >3 FB     Dental no notable dental hx.    Pulmonary sleep apnea and Continuous Positive Airway Pressure Ventilation    breath sounds clear to auscultation       Cardiovascular hypertension, (-) angina (-) CAD, (-) Past MI and (-) Cardiac Stents + dysrhythmias Atrial Fibrillation + Valvular Problems/Murmurs MR  Rhythm:Regular Rate:Normal  IMPRESSIONS     1. Left ventricular ejection fraction, by estimation, is 65 to 70%. The  left ventricle has normal function. The left ventricle has no regional  wall motion abnormalities. Left ventricular diastolic parameters were  normal.   2. Right ventricular systolic function is normal. The right ventricular  size is normal.   3. The mitral valve is normal in structure. Mild to moderate mitral valve  regurgitation.   4. Tricuspid valve regurgitation is mild to moderate.   5. The aortic valve is normal in structure. Aortic valve regurgitation is  not visualized.      Neuro/Psych neg Seizures PSYCHIATRIC DISORDERS  Depression     Neuromuscular disease    GI/Hepatic ,GERD  ,,  Endo/Other    Renal/GU      Musculoskeletal  (+) Arthritis ,  Fibromyalgia -  Abdominal   Peds  Hematology  (+) Blood dyscrasia, anemia   Anesthesia Other Findings   Reproductive/Obstetrics                              Anesthesia Physical Anesthesia Plan  ASA: 2  Anesthesia Plan: General   Post-op Pain Management: Regional block*   Induction: Intravenous  PONV Risk Score and Plan: 2 and Ondansetron  and  Dexamethasone   Airway Management Planned: Oral ETT  Additional Equipment:   Intra-op Plan:   Post-operative Plan: Extubation in OR  Informed Consent: I have reviewed the patients History and Physical, chart, labs and discussed the procedure including the risks, benefits and alternatives for the proposed anesthesia with the patient or authorized representative who has indicated his/her understanding and acceptance.     Dental advisory given  Plan Discussed with: CRNA  Anesthesia Plan Comments: (See PAT note 01/15/2024)        Anesthesia Quick Evaluation

## 2024-01-15 NOTE — Progress Notes (Signed)
 Anesthesia Chart Review   Case: [id number] Date/Time: 01/16/24 0715   Procedure: ARTHROPLASTY, SHOULDER, TOTAL, REVERSE (Left: Shoulder)   Anesthesia type: General   Diagnosis: Rotator cuff tear arthropathy of left shoulder [M75.102, M12.812]   Pre-op diagnosis: Left Rotator Cuff tear with arthropathy   [facility] ROOM 06 / [facility]   Surgeons: Melita Drivers, MD       DISCUSSION:73 y.o. never smoker with h/o HTN, sleep apnea, fibromyalgia, Raynaud's, atrial fibrillation/a flutter s/p ablation 04/2023, left rotator cuff tear and arthropathy scheduled for above procedure 01/16/2024 with Dr. Drivers Melita.   Pt with h/o breast cancer.  S/p right breast reconstruction 11/18/2023 at [facility] with no anesthesia complications noted. ASA 3.   Pt seen by cardio 01/10/2024. Per OV note pt maintaining NSR, on diltiazem . BP high at this visit, losartan  25 mg started at this visit.  The patient is stable from a cardiac perspective. Patient has been cleared to hold Eliquis  3 days prior to surgery. METS >4. RCRI =0, 3.9% risk of MACE. No further cardiac work-up prior to surgery.   Pt reports last dose of Eliquis  01/10/2024 PM dose.  VS: BP 139/85   Pulse 82   Temp 37.1 C (Oral)   Resp 16   Ht 5' 3 (1.6 m)   Wt 72.6 kg   SpO2 99%   BMI 28.36 kg/m   PROVIDERS: Valora Agent, MD is PCP   Cardiologist - Deatrice Cage, MD   Electrophysiologist - Ole Holts, MD  LABS: Labs reviewed: Acceptable for surgery. (all labs ordered are listed, but only abnormal results are displayed)  Labs Reviewed  CBC - Abnormal; Notable for the following components:      Result Value   RDW 16.6 (*)    All other components within normal limits  SURGICAL PCR SCREEN  BASIC METABOLIC PANEL WITH GFR     IMAGES: US  Carotid Bilateral 02/27/2022 IMPRESSION: No Doppler evidence of hemodynamically significant (greater than 50%) carotid stenosis, with small amounts of nonstenosing plaque in the carotid bulbs and  proximal cervical ICAs, and bilateral antegrade vertebral artery flow.  EKG:   CV: Cardiac CTA 05/2023  IMPRESSION: 1. There is normal pulmonary vein drainage into the left atrium. (2 on the right and 2 on the left).   2. The left atrial appendage is a Windsock type with two lobes and ostial size 30 x 22 mm and length 38 mm, Area 52 mm2. There is no thrombus in the left atrial appendage.   3. The esophagus runs in the left atrial midline and is not in the proximity to any of the pulmonary veins.   4. Coronary calcium  score of 0.  Myocardial Perfusion 04/30/2022   The study is normal. The study is low risk.   No ST deviation was noted.   LV perfusion is normal. There is no evidence of ischemia. There is no evidence of infarction.   Left ventricular function is normal. End diastolic cavity size is normal. End systolic cavity size is normal.   CT attenuation images showed mild aortic calcifications.  Echo 02/27/2022  1. Left ventricular ejection fraction, by estimation, is 65 to 70%. The  left ventricle has normal function. The left ventricle has no regional  wall motion abnormalities. Left ventricular diastolic parameters were  normal.   2. Right ventricular systolic function is normal. The right ventricular  size is normal.   3. The mitral valve is normal in structure. Mild to moderate mitral valve  regurgitation.   4.  Tricuspid valve regurgitation is mild to moderate.   5. The aortic valve is normal in structure. Aortic valve regurgitation is  not visualized.  Past Medical History:  Diagnosis Date   Anemia    Arthritis    osteo   Blindness of right eye at birth   Cancer [facility]) 2011   Left breast 2011 and cervical age 56   Carpal tunnel syndrome    Cataract    Depression    Edema 07/27/2011   Fibromyalgia    muscle weakness and pain   GERD (gastroesophageal reflux disease)    Hyperlipidemia    Hypertension    benign   Lymphedema    Osteoporosis    Plantar  fasciitis    Pre-diabetes    Raynaud's disease    Restless leg syndrome    Rosacea    Rotator cuff rupture    Sleep apnea    Synovitis    Ulcer     Past Surgical History:  Procedure Laterality Date   ABDOMINAL HYSTERECTOMY  1986   For bleeding and pain   ATRIAL FIBRILLATION ABLATION N/A 05/14/2023   Procedure: ATRIAL FIBRILLATION ABLATION;  Surgeon: Cindie Ole DASEN, MD;  [facility]: [facility];  Service: Cardiovascular;  Laterality: N/A;   BLADDER SURGERY  2006   Bladder tack   BREAST RECONSTRUCTION Bilateral 09/17/2012   Procedure: BREAST RECONSTRUCTION;  Surgeon: Estefana Reichert, DO;  Location: [facility];  Service: Plastics;  Laterality: Bilateral;  BILATERAL BREAST RECONSTRUCTION WITH TISSUE EXPANDERS AND ALLOMED   BREAST RECONSTRUCTION Right 06/03/2013   Procedure: RIGHT BREAST CAPSULE CONSTRACTURE;  Surgeon: Estefana Reichert, DO;  Location: [facility];  Service: Plastics;  Laterality: Right;   CAPSULECTOMY Right 11/18/2023   Procedure: EXCISION OF RIGHT BREAST CAPSULE CONTRACTURE AND BREAST TISSUE;  Surgeon: Lowery Estefana RAMAN, DO;  Location: [facility];  Service: Plastics;  Laterality: Right;   CARDIOVERSION N/A 08/06/2022   Procedure: CARDIOVERSION;  Surgeon: Darron Deatrice LABOR, MD;  Location: [facility];  Service: Cardiovascular;  Laterality: N/A;   CARDIOVERSION N/A 10/02/2022   Procedure: CARDIOVERSION;  Surgeon: Mady Bruckner, MD;  Location: [facility];  Service: Cardiovascular;  Laterality: N/A;   CARPAL TUNNEL RELEASE Bilateral 2008   EYE SURGERY Right    INJECTION KNEE Left 08/12/2018   Procedure: KNEE INJECTION-LEFT;  Surgeon: Edie Norleen PARAS, MD;  Location: [facility];  Service: Orthopedics;  Laterality: Left;   JOINT REPLACEMENT     LIPOSUCTION Bilateral 01/29/2013   Procedure: LIPOSUCTION;  Surgeon: Estefana Reichert, DO;  Location: [facility];  Service: Plastics;  Laterality: Bilateral;   LIPOSUCTION Right  06/03/2013   Procedure: LIPOSUCTION;  Surgeon: Estefana Reichert, DO;  Location: [facility];  Service: Plastics;  Laterality: Right;   LIPOSUCTION Right 11/18/2023   Procedure: LIPOSUCTION LATERAL RIGHT CHEST;  Surgeon: Lowery Estefana RAMAN, DO;  Location: [facility];  Service: Plastics;  Laterality: Right;   MASTECTOMY  2012   rt prophalactic mast-snbx   MASTECTOMY MODIFIED RADICAL  2012   left-axillary nodes   NEUROMA SURGERY Bilateral    NOSE SURGERY  2007   PORT-A-CATH REMOVAL     insertion and   REVISION, RECONSTRUCTION, BREAST Right 11/18/2023   Procedure: REVISION RIGHT BREAST RECONSTRUCTION;  Surgeon: Lowery Estefana RAMAN, DO;  Location: [facility];  Service: Plastics;  Laterality: Right;  Excision of scar, capsule contracture right breast , possible liposuction   THROAT SURGERY     TONSILLECTOMY  TOTAL KNEE ARTHROPLASTY Right 08/12/2018   Procedure: TOTAL KNEE ARTHROPLASTY-RIGHT;  Surgeon: Edie Norleen PARAS, MD;  Location: [facility];  Service: Orthopedics;  Laterality: Right;   TOTAL KNEE ARTHROPLASTY Left 09/22/2019   Procedure: TOTAL KNEE ARTHROPLASTY;  Surgeon: Edie Norleen PARAS, MD;  Location: [facility];  Service: Orthopedics;  Laterality: Left;   UVULOPALATOPHARYNGOPLASTY      MEDICATIONS:  alendronate  (FOSAMAX ) 70 MG tablet   amoxicillin (AMOXIL) 500 MG capsule   apixaban  (ELIQUIS ) 5 MG TABS tablet   diltiazem  (CARDIZEM  CD) 120 MG 24 hr capsule   DULoxetine (CYMBALTA) 60 MG capsule   fluticasone  (FLONASE ) 50 MCG/ACT nasal spray   folic acid  (FOLVITE ) 1 MG tablet   furosemide  (LASIX ) 20 MG tablet   levocetirizine (XYZAL) 5 MG tablet   losartan  (COZAAR ) 25 MG tablet   losartan  (COZAAR ) 25 MG tablet   LYSINE ACETATE PO   magnesium  oxide (MAG-OX) 400 (240 Mg) MG tablet   meloxicam (MOBIC) 7.5 MG tablet   montelukast (SINGULAIR) 10 MG tablet   neomycin-polymyxin b -dexamethasone  (MAXITROL) 3.5-10000-0.1 SUSP   omeprazole (PRILOSEC) 20 MG  capsule   potassium chloride  SA (KLOR-CON  M) 20 MEQ tablet   pregabalin  (LYRICA ) 300 MG capsule   rOPINIRole  (REQUIP ) 1 MG tablet   triamcinolone  (KENALOG ) 0.025 % cream   Vibegron  (GEMTESA ) 75 MG TABS   Vitamin D , Ergocalciferol , (DRISDOL) 1.25 MG (50000 UNIT) CAPS capsule   zolpidem  (AMBIEN ) 10 MG tablet   No current facility-administered medications for this encounter.    Harlene Hoots Ward, PA-C [facility] Pre-Surgical Testing [phone]

## 2024-01-16 ENCOUNTER — Encounter (HOSPITAL_COMMUNITY)
Admission: RE | Disposition: A | Discharge status: Home / Self Care | Payer: Self-pay | Source: Home / Self Care | Attending: Orthopedic Surgery

## 2024-01-16 ENCOUNTER — Other Ambulatory Visit: Payer: Self-pay

## 2024-01-16 ENCOUNTER — Ambulatory Visit (HOSPITAL_COMMUNITY): Payer: Self-pay | Admitting: Physician Assistant

## 2024-01-16 ENCOUNTER — Ambulatory Visit (HOSPITAL_BASED_OUTPATIENT_CLINIC_OR_DEPARTMENT_OTHER): Payer: Self-pay | Admitting: Anesthesiology

## 2024-01-16 ENCOUNTER — Ambulatory Visit (HOSPITAL_COMMUNITY)
Admission: RE | Admit: 2024-01-16 | Discharge: 2024-01-16 | Disposition: A | Discharge status: Home / Self Care | Attending: Orthopedic Surgery | Admitting: Orthopedic Surgery

## 2024-01-16 ENCOUNTER — Ambulatory Visit: Payer: Self-pay | Admitting: Medical

## 2024-01-16 ENCOUNTER — Encounter (HOSPITAL_COMMUNITY): Payer: Self-pay | Admitting: Orthopedic Surgery

## 2024-01-16 DIAGNOSIS — M19012 Primary osteoarthritis, left shoulder: Secondary | ICD-10-CM | POA: Diagnosis not present

## 2024-01-16 DIAGNOSIS — I10 Essential (primary) hypertension: Secondary | ICD-10-CM | POA: Insufficient documentation

## 2024-01-16 DIAGNOSIS — G4730 Sleep apnea, unspecified: Secondary | ICD-10-CM | POA: Diagnosis not present

## 2024-01-16 DIAGNOSIS — M75102 Unspecified rotator cuff tear or rupture of left shoulder, not specified as traumatic: Secondary | ICD-10-CM | POA: Diagnosis not present

## 2024-01-16 DIAGNOSIS — K219 Gastro-esophageal reflux disease without esophagitis: Secondary | ICD-10-CM | POA: Diagnosis not present

## 2024-01-16 DIAGNOSIS — I4891 Unspecified atrial fibrillation: Secondary | ICD-10-CM | POA: Diagnosis not present

## 2024-01-16 DIAGNOSIS — M129 Arthropathy, unspecified: Secondary | ICD-10-CM | POA: Insufficient documentation

## 2024-01-16 DIAGNOSIS — G8918 Other acute postprocedural pain: Secondary | ICD-10-CM | POA: Diagnosis not present

## 2024-01-16 DIAGNOSIS — M12812 Other specific arthropathies, not elsewhere classified, left shoulder: Secondary | ICD-10-CM | POA: Diagnosis not present

## 2024-01-16 DIAGNOSIS — F32A Depression, unspecified: Secondary | ICD-10-CM | POA: Diagnosis not present

## 2024-01-16 DIAGNOSIS — M797 Fibromyalgia: Secondary | ICD-10-CM | POA: Diagnosis not present

## 2024-01-16 HISTORY — PX: REVERSE SHOULDER ARTHROPLASTY: SHX5054

## 2024-01-16 LAB — BASIC METABOLIC PANEL WITH GFR
BUN/Creatinine Ratio: 17 (ref 12–28)
BUN: 17 mg/dL (ref 8–27)
CO2: 23 mmol/L (ref 20–29)
Calcium: 8.8 mg/dL (ref 8.7–10.3)
Chloride: 101 mmol/L (ref 96–106)
Creatinine, Ser: 1.03 mg/dL — ABNORMAL HIGH (ref 0.57–1.00)
Glucose: 64 mg/dL — ABNORMAL LOW (ref 70–99)
Potassium: 4.2 mmol/L (ref 3.5–5.2)
Sodium: 141 mmol/L (ref 134–144)
eGFR: 58 mL/min/{1.73_m2} — ABNORMAL LOW (ref >59)

## 2024-01-16 SURGERY — ARTHROPLASTY, SHOULDER, TOTAL, REVERSE
Anesthesia: General | Site: Shoulder | Laterality: Left

## 2024-01-16 MED ORDER — FENTANYL CITRATE PF 50 MCG/ML IJ SOSY
PREFILLED_SYRINGE | INTRAMUSCULAR | Status: AC
  Filled 2024-01-16: qty 1

## 2024-01-16 MED ORDER — PROPOFOL 10 MG/ML IV BOLUS
INTRAVENOUS | Status: DC | PRN
  Administered 2024-01-16: 110 mg via INTRAVENOUS

## 2024-01-16 MED ORDER — OXYCODONE HCL 5 MG/5ML PO SOLN: 5.0000 mg | Freq: Once | ORAL | Status: AC | PRN

## 2024-01-16 MED ORDER — OXYCODONE HCL 5 MG PO TABS
ORAL_TABLET | ORAL | Status: AC
  Filled 2024-01-16: qty 1

## 2024-01-16 MED ORDER — SUGAMMADEX SODIUM 200 MG/2ML IV SOLN
INTRAVENOUS | Status: DC | PRN
  Administered 2024-01-16: 200 mg via INTRAVENOUS

## 2024-01-16 MED ORDER — PROPOFOL 10 MG/ML IV BOLUS
INTRAVENOUS | Status: AC
Start: 2024-01-16 — End: 2024-01-16
  Filled 2024-01-16: qty 20

## 2024-01-16 MED ORDER — ROCURONIUM 10MG/ML (10ML) SYRINGE FOR MEDFUSION PUMP - OPTIME
INTRAVENOUS | Status: DC | PRN
  Administered 2024-01-16: 80 mg via INTRAVENOUS

## 2024-01-16 MED ORDER — DEXAMETHASONE SODIUM PHOSPHATE 10 MG/ML IJ SOLN
INTRAMUSCULAR | Status: DC | PRN
Start: 2024-01-16 — End: 2024-01-16
  Administered 2024-01-16: 10 mg via INTRAVENOUS

## 2024-01-16 MED ORDER — OXYCODONE HCL 5 MG PO TABS
5.0000 mg | ORAL_TABLET | Freq: Once | ORAL | Status: AC | PRN
  Administered 2024-01-16: 5 mg via ORAL

## 2024-01-16 MED ORDER — MIDAZOLAM HCL 2 MG/2ML IJ SOLN
INTRAMUSCULAR | Status: DC | PRN
  Administered 2024-01-16 (×2): 1 mg via INTRAVENOUS

## 2024-01-16 MED ORDER — LACTATED RINGERS IV SOLN
INTRAVENOUS | Status: DC
Start: 2024-01-16 — End: 2024-01-16

## 2024-01-16 MED ORDER — FENTANYL CITRATE PF 50 MCG/ML IJ SOSY
25.0000 ug | PREFILLED_SYRINGE | INTRAMUSCULAR | Status: DC | PRN
  Administered 2024-01-16: 50 ug via INTRAVENOUS

## 2024-01-16 MED ORDER — TRANEXAMIC ACID-NACL 1000-0.7 MG/100ML-% IV SOLN
INTRAVENOUS | Status: AC
  Filled 2024-01-16: qty 100

## 2024-01-16 MED ORDER — LIDOCAINE HCL (PF) 2 % IJ SOLN
INTRAMUSCULAR | Status: AC
  Filled 2024-01-16: qty 5

## 2024-01-16 MED ORDER — CEFAZOLIN SODIUM-DEXTROSE 2-4 GM/100ML-% IV SOLN
INTRAVENOUS | Status: AC
  Filled 2024-01-16: qty 100

## 2024-01-16 MED ORDER — ACETAMINOPHEN 10 MG/ML IV SOLN: 1000.0000 mg | Freq: Once | INTRAVENOUS | Status: DC | PRN

## 2024-01-16 MED ORDER — CYCLOBENZAPRINE HCL 10 MG PO TABS: 10.0000 mg | ORAL_TABLET | Freq: Three times a day (TID) | ORAL | 1 refills | Status: AC | PRN

## 2024-01-16 MED ORDER — FENTANYL CITRATE (PF) 250 MCG/5ML IJ SOLN
INTRAMUSCULAR | Status: DC | PRN
  Administered 2024-01-16 (×2): 50 ug via INTRAVENOUS

## 2024-01-16 MED ORDER — CHLORHEXIDINE GLUCONATE 0.12 % MT SOLN
15.0000 mL | Freq: Once | OROMUCOSAL | Status: AC
  Administered 2024-01-16: 15 mL via OROMUCOSAL

## 2024-01-16 MED ORDER — FENTANYL CITRATE (PF) 100 MCG/2ML IJ SOLN
INTRAMUSCULAR | Status: AC
  Filled 2024-01-16: qty 2

## 2024-01-16 MED ORDER — DEXAMETHASONE SODIUM PHOSPHATE 10 MG/ML IJ SOLN
INTRAMUSCULAR | Status: AC
  Filled 2024-01-16: qty 1

## 2024-01-16 MED ORDER — VANCOMYCIN HCL 1000 MG IV SOLR
INTRAVENOUS | Status: AC
  Filled 2024-01-16: qty 20

## 2024-01-16 MED ORDER — ONDANSETRON HCL 4 MG/2ML IJ SOLN: 4.0000 mg | Freq: Once | INTRAMUSCULAR | Status: DC | PRN

## 2024-01-16 MED ORDER — TRANEXAMIC ACID 1000 MG/10ML IV SOLN
INTRAVENOUS | Status: DC | PRN
  Administered 2024-01-16: 1000 mg via INTRAVENOUS

## 2024-01-16 MED ORDER — EPHEDRINE SULFATE (PRESSORS) 50 MG/ML IJ SOLN
INTRAMUSCULAR | Status: DC | PRN
Start: 2024-01-16 — End: 2024-01-16
  Administered 2024-01-16: 10 mg via INTRAVENOUS

## 2024-01-16 MED ORDER — ORAL CARE MOUTH RINSE: 15.0000 mL | Freq: Once | OROMUCOSAL | Status: AC

## 2024-01-16 MED ORDER — ONDANSETRON HCL 4 MG PO TABS: 4.0000 mg | ORAL_TABLET | Freq: Three times a day (TID) | ORAL | 0 refills | Status: AC | PRN

## 2024-01-16 MED ORDER — ROCURONIUM BROMIDE 10 MG/ML (PF) SYRINGE
PREFILLED_SYRINGE | INTRAVENOUS | Status: AC
Start: 2024-01-16 — End: 2024-01-16
  Filled 2024-01-16: qty 10

## 2024-01-16 MED ORDER — OXYCODONE-ACETAMINOPHEN 5-325 MG PO TABS: 1.0000 | ORAL_TABLET | ORAL | 0 refills | Status: AC | PRN

## 2024-01-16 MED ORDER — 0.9 % SODIUM CHLORIDE (POUR BTL) OPTIME
TOPICAL | Status: DC | PRN
  Administered 2024-01-16: 1000 mL

## 2024-01-16 MED ORDER — BUPIVACAINE LIPOSOME 1.3 % IJ SUSP
INTRAMUSCULAR | Status: DC | PRN
  Administered 2024-01-16: 10 mL

## 2024-01-16 MED ORDER — VANCOMYCIN HCL 1000 MG IV SOLR
INTRAVENOUS | Status: DC | PRN
  Administered 2024-01-16: 1000 mg via TOPICAL

## 2024-01-16 MED ORDER — MIDAZOLAM HCL 2 MG/2ML IJ SOLN
INTRAMUSCULAR | Status: AC
  Filled 2024-01-16: qty 2

## 2024-01-16 MED ORDER — CEFAZOLIN SODIUM-DEXTROSE 2-4 GM/100ML-% IV SOLN
2.0000 g | Freq: Once | INTRAVENOUS | Status: AC
  Administered 2024-01-16: 2 g via INTRAVENOUS

## 2024-01-16 MED ORDER — PHENYLEPHRINE HCL (PRESSORS) 10 MG/ML IV SOLN
INTRAVENOUS | Status: DC | PRN
  Administered 2024-01-16: 160 ug via INTRAVENOUS

## 2024-01-16 MED ORDER — SUCCINYLCHOLINE CHLORIDE 200 MG/10ML IV SOSY
PREFILLED_SYRINGE | INTRAVENOUS | Status: AC
  Filled 2024-01-16: qty 10

## 2024-01-16 MED ORDER — ONDANSETRON HCL 4 MG/2ML IJ SOLN
INTRAMUSCULAR | Status: DC | PRN
  Administered 2024-01-16: 4 mg via INTRAVENOUS

## 2024-01-16 MED ORDER — ONDANSETRON HCL 4 MG/2ML IJ SOLN
INTRAMUSCULAR | Status: AC
  Filled 2024-01-16: qty 2

## 2024-01-16 MED ORDER — BUPIVACAINE HCL (PF) 0.5 % IJ SOLN
INTRAMUSCULAR | Status: DC | PRN
Start: 2024-01-16 — End: 2024-01-16
  Administered 2024-01-16: 10 mL

## 2024-01-16 SURGICAL SUPPLY — 58 items
BAG COUNTER SPONGE SURGICOUNT (BAG) IMPLANT
BAG ZIPLOCK 12X15 (MISCELLANEOUS) ×2 IMPLANT
BIT DRILL AR 3 NS (BIT) IMPLANT
BLADE SAW SGTL 83.5X18.5 (BLADE) ×2 IMPLANT
BNDG COHESIVE 4X5 TAN STRL LF (GAUZE/BANDAGES/DRESSINGS) ×2 IMPLANT
COOLER ICEMAN CLASSIC (MISCELLANEOUS) ×2 IMPLANT
COVER BACK TABLE 60X90IN (DRAPES) ×2 IMPLANT
COVER SURGICAL LIGHT HANDLE (MISCELLANEOUS) ×2 IMPLANT
CUP SUT UNIV REVERS 36 NEUTRAL (Cup) IMPLANT
DRAPE SHEET LG 3/4 BI-LAMINATE (DRAPES) ×2 IMPLANT
DRAPE SURG 17X11 SM STRL (DRAPES) ×2 IMPLANT
DRAPE SURG ORHT 6 SPLT 77X108 (DRAPES) ×4 IMPLANT
DRAPE TOP 10253 STERILE (DRAPES) ×2 IMPLANT
DRAPE U-SHAPE 47X51 STRL (DRAPES) ×2 IMPLANT
DRESSING AQUACEL AG SP 3.5X6 (GAUZE/BANDAGES/DRESSINGS) ×2 IMPLANT
DURAPREP 26ML APPLICATOR (WOUND CARE) ×2 IMPLANT
ELECT BLADE TIP CTD 4 INCH (ELECTRODE) ×2 IMPLANT
ELECT PENCIL ROCKER SW 15FT (MISCELLANEOUS) ×2 IMPLANT
ELECT REM PT RETURN 15FT ADLT (MISCELLANEOUS) ×2 IMPLANT
FACESHIELD WRAPAROUND (MASK) ×5 IMPLANT
FACESHIELD WRAPAROUND OR TEAM (MASK) ×10 IMPLANT
GLENOID UNI REV MOD 24 +2 LAT (Joint) IMPLANT
GLENOSPHERE 36 +4 LAT/24 (Joint) IMPLANT
GLOVE BIO SURGEON STRL SZ7.5 (GLOVE) ×2 IMPLANT
GLOVE BIO SURGEON STRL SZ8 (GLOVE) ×2 IMPLANT
GLOVE SS BIOGEL STRL SZ 7 (GLOVE) ×2 IMPLANT
GLOVE SS BIOGEL STRL SZ 7.5 (GLOVE) ×2 IMPLANT
GOWN STRL SURGICAL XL XLNG (GOWN DISPOSABLE) ×4 IMPLANT
KIT BASIN OR (CUSTOM PROCEDURE TRAY) ×2 IMPLANT
KIT TURNOVER KIT A (KITS) ×2 IMPLANT
LINER HUMERAL 36 +3MM SM (Shoulder) IMPLANT
MANIFOLD NEPTUNE II (INSTRUMENTS) ×2 IMPLANT
NDL TAPERED W/ NITINOL LOOP (MISCELLANEOUS) ×2 IMPLANT
NEEDLE TAPERED W/ NITINOL LOOP (MISCELLANEOUS) ×1 IMPLANT
NS IRRIG 1000ML POUR BTL (IV SOLUTION) ×2 IMPLANT
PACK SHOULDER (CUSTOM PROCEDURE TRAY) ×2 IMPLANT
PAD ARMBOARD POSITIONER FOAM (MISCELLANEOUS) ×2 IMPLANT
PAD COLD SHLDR WRAP-ON (PAD) ×2 IMPLANT
PIN NITINOL TARGETER 2.8 (PIN) IMPLANT
PIN SET MODULAR GLENOID SYSTEM (PIN) IMPLANT
RESTRAINT HEAD UNIVERSAL NS (MISCELLANEOUS) ×2 IMPLANT
SCREW CENTRAL MOD 30MM (Screw) IMPLANT
SCREW PERI LOCK 5.5X24 (Screw) IMPLANT
SCREW PERI LOCK 5.5X32 (Screw) IMPLANT
SCREW PERIPHERAL 5.5X20 LOCK (Screw) IMPLANT
SLING ARM FOAM STRAP LRG (SOFTGOODS) IMPLANT
SLING ARM FOAM STRAP MED (SOFTGOODS) IMPLANT
STEM HUMERAL UNIVERS SZ8 (Stem) IMPLANT
STRIP CLOSURE SKIN 1/2X4 (GAUZE/BANDAGES/DRESSINGS) ×2 IMPLANT
SUT MNCRL AB 3-0 PS2 18 (SUTURE) ×2 IMPLANT
SUT MON AB 2-0 CT1 36 (SUTURE) ×2 IMPLANT
SUT VIC AB 1 CT1 36 (SUTURE) ×2 IMPLANT
SUTURE TAPE 1.3 40 TPR END (SUTURE) ×4 IMPLANT
TOWEL GREEN STERILE FF (TOWEL DISPOSABLE) ×2 IMPLANT
TOWEL OR 17X26 10 PK STRL BLUE (TOWEL DISPOSABLE) ×2 IMPLANT
TUBE SUCTION HIGH CAP CLEAR NV (SUCTIONS) ×2 IMPLANT
TUBING CONNECTING 10 (TUBING) ×2 IMPLANT
WATER STERILE IRR 1000ML POUR (IV SOLUTION) ×4 IMPLANT

## 2024-01-16 NOTE — Discharge Instructions (Signed)
 Krystal [name]. Supple, M.D., F.A.A.O.S. Orthopaedic Surgery Specializing in Arthroscopic and Reconstructive Surgery of the Shoulder [phone] [address] [place], [place] [address] - Fax [phone]    POST-OP REVERSE TOTAL SHOULDER REPLACEMENT INSTRUCTIONS  1. Your first follow up appointment is:  2. The bandage over your incision is waterproof. You may begin showering with this dressing on immediately but do not submerge in a bath tub or hot tub. You may leave this dressing on until first follow up appointment in around 2 weeks. We prefer you leave this dressing in place until follow up however after 5-7 days if you are having itching or skin irritation and would like to remove it you may do so. Go slow and tug at the borders gently to break the bond the dressing has with the skin. At this point if there is no drainage it is okay to go without a bandage or you may cover it with a light gauze and tape. Leave your steri-strips across your incision. You can also expect significant bruising around your shoulder that will drift down your arm and into your chest wall. This is very normal and should resolve over several days. It is also very normal to have some swelling to the operative extremity that will involve the forearm and hand and fingers. It is recommended that if you notice this then try to elevate your hand and fist above your heart and perform gripping exercises several times an hour to help reduce this swelling (see #4).   3. Wear your sling if you are going to be up walking around and when you go to sleep at night. Also protect the arm in the sling until your nerve block has worn off. Then it is ok to remove the sling to perform the exercises below or to occasionally let your arm dangle by your side to stretch your elbow. It is ok to remove your sling if you are sitting in a controlled environment and allow your arm to rest in a position of comfort by your side or on your  lap with pillows to give your neck and skin a break from the sling. You may remove it to allow arm to dangle by side to shower. It is also ok to use your operative extremity to help do light waist level activities and to assist with hygiene and ADL's such as brushing your teeth, getting dressed and toileting. We do ask you refrain from reaching behind your body or back. Also do not use the operative arm to support body weight when getting up or down from bed or a chair.  4. Range of motion to your elbow, wrist, and hand are encouraged 3-5 times daily. Exercise to your hand and fingers helps to reduce swelling you may experience. If you have a foam ball you can use this. If not you can simply use a washcloth to squeeze.   5. Utilize the ice machine as much as possible over the first several days, but it is OK to remove to get up to walk around several times a day to stretch your legs and to go to the bathroom. When in the ice machine, please make sure there is a layer of clothing between the sleeve and your skin and check your skin frequently to make sure there is no redness or skin irritation, if you notice this then give your skin a break from the ice machine for several hours. After the first 3-4 days you  can gradually reduce how much time you spend in the ice machine and adjust to your level of pain. In general it is a good rule of thumb that if you are experiencing pain you should be using the ice machine.  6. Prescriptions for a pain medication and a muscle relaxant are provided for you. It is recommended that if you are experiencing pain that your pain medication alone is not controlling, add the muscle relaxant along with the pain medication which can give additional pain relief. The first 1-2 days after your block has worn off is generally the most severe of your pain and then should gradually decrease. As your pain lessens it is recommended that you decrease your use of the pain medications to an "as  needed basis'" only and to always comply with the recommended dosages of the pain medications. It is also ok to utilize over the counter form of pain medicines such as acetaminophen instead of the prescription pain medications if your pain is only mild. But do not use the prescription pain med and acetaminophen at the same time, it is either one or the other, not both.  7. Pain medications can produce constipation along with their use. If you experience this, the use of an over the counter stool softener or laxative daily is recommended.   8. For additional questions or concerns, please do not hesitate to call the office. If after hours there is an answering service to forward your concerns to the physician or physician assistant on call.  9. Pain control following an exparel nerve block:  To help control your post-operative pain you received a nerve block  performed with Exparel which is a long acting anesthetic (numbing agent) which can provide pain relief and sensations of numbness (and relief of pain) in the operative shoulder and arm for up to 3 days. Sometimes it provides mixed relief, meaning you may still have numbness in certain areas of the arm but can still be able to move parts of that arm, hand, and fingers. We recommend that your prescribed pain medications be used as needed. We do not feel it is necessary to "pre medicate" and "stay ahead" of pain.  Taking narcotic pain medications when you are not having any pain can lead to unnecessary and potentially dangerous side effects.  While the effects of the nerve block are present, please be aware that while you do not have sensation we recommend you pay careful attention to keep your arm positioned in a protective way. Also if you have a sling that has a "thumb loop" that helps to keep the sling from sliding backwards, make sure you remove the loop to avoid a constant pressure to the thumb which can cause some nerve damage or skin  breakdown.  10. Most people find it more comfortable to sleep in a semi upright position or in a recliner with the arm supported. Again, we do recommend you wear your sling to sleep. It is also ok to try to sleep lying flat in bed with a pillow behind your arm to keep it from sliding or falling backwards. If you are trying to sleep on the non-operative side, please use pillows to position your arm so that it does not slide forwards or backwards. It is very common that sleeping and getting into a comfortable position is difficult after shoulder surgery, this should improve with time.  11. Dressing - It is recommended you wear shirts that are loose or button up the  front. To put shirt on allow operative arm to dangle by your side and slide the shirt on that arm, then your head and then the non-operative arm. To remove the shirt do this sequence in reverse. We recommend you wear the sling over top of your shirt to prevent skin irritation. If you notice irritation in your axilla (arm pit) please try to elevate arm away from your body to allow air to get in this area and consider use of an over the counter ointment such as hydrocortisone or simple lotion.  FOR ADDITIONAL INFO ON THE [employer] ICE MACHINE AND INSTRUCTIONS GO TO THE WEBSITE AT  [email or web address]   I  12.  We recommend that you avoid any dental work or cleaning in the first 3 months following your joint replacement. This is to help minimize the possibility of infection from the bacteria in your mouth that enters your bloodstream during dental work. We also recommend that you take an antibiotic prior to your dental work for the first year after your shoulder replacement to further help reduce that risk. Please simply contact our office for antibiotics to be sent to your pharmacy prior to dental work.  13. Post Op Exercises: Please see attached sheet with diagram

## 2024-01-16 NOTE — Anesthesia Procedure Notes (Signed)
 Anesthesia Regional Block: Interscalene brachial plexus block   Pre-Anesthetic Checklist: , timeout performed,  Correct Patient, Correct Site, Correct Laterality,  Correct Procedure, Correct Position, site marked,  Risks and benefits discussed,  Surgical consent,  Pre-op evaluation,  At surgeon's request and post-op pain management  Laterality: Left  Prep: Maximum Sterile Barrier Precautions used, chloraprep       Needles:  Injection technique: Single-shot  Needle Type: Echogenic Needle      Needle Gauge: 20     Additional Needles:   Procedures:,,,, ultrasound used (permanent image in chart),,    Narrative:  Start time: 01/16/2024 7:00 AM End time: 01/16/2024 7:05 AM Injection made incrementally with aspirations every 5 mL.  Performed by: Personally  Anesthesiologist: Keneth Lynwood POUR, MD

## 2024-01-16 NOTE — H&P (Signed)
 Rojelio VEAR Brain    Chief Complaint: Left Rotator Cuff tear with arthropathy HPI: The patient is a 73 y.o. female with chronic and progressive increasing left shoulder pain related to severe rotator cuff tear arthropathy.  Due to her increasing functional limitations and failure to respond to prolonged attempts at conservative management, she is brought to the operating room today for planned left shoulder reverse arthroplasty.  Past Medical History:  Diagnosis Date   Anemia    Arthritis    osteo   Blindness of right eye at birth   Cancer [facility]) 2011   Left breast 2011 and cervical age 43   Carpal tunnel syndrome    Cataract    Depression    Edema 07/27/2011   Fibromyalgia    muscle weakness and pain   GERD (gastroesophageal reflux disease)    Hyperlipidemia    Hypertension    benign   Lymphedema    Osteoporosis    Plantar fasciitis    Pre-diabetes    Raynaud's disease    Restless leg syndrome    Rosacea    Rotator cuff rupture    Sleep apnea    Synovitis    Ulcer       Past Surgical History:  Procedure Laterality Date   ABDOMINAL HYSTERECTOMY  1986   For bleeding and pain   ATRIAL FIBRILLATION ABLATION N/A 05/14/2023   Procedure: ATRIAL FIBRILLATION ABLATION;  Surgeon: Cindie Ole DASEN, MD;  Location: [facility];  Service: Cardiovascular;  Laterality: N/A;   BLADDER SURGERY  2006   Bladder tack   BREAST RECONSTRUCTION Bilateral 09/17/2012   Procedure: BREAST RECONSTRUCTION;  Surgeon: Estefana Reichert, DO;  Location: [facility];  Service: Plastics;  Laterality: Bilateral;  BILATERAL BREAST RECONSTRUCTION WITH TISSUE EXPANDERS AND ALLOMED   BREAST RECONSTRUCTION Right 06/03/2013   Procedure: RIGHT BREAST CAPSULE CONSTRACTURE;  Surgeon: Estefana Reichert, DO;  Location: [facility];  Service: Plastics;  Laterality: Right;   CAPSULECTOMY Right 11/18/2023   Procedure: EXCISION OF RIGHT BREAST CAPSULE CONTRACTURE AND BREAST TISSUE;  Surgeon:  Lowery Estefana RAMAN, DO;  Location: [facility];  Service: Plastics;  Laterality: Right;   CARDIOVERSION N/A 08/06/2022   Procedure: CARDIOVERSION;  Surgeon: Darron Deatrice LABOR, MD;  Location: [facility];  Service: Cardiovascular;  Laterality: N/A;   CARDIOVERSION N/A 10/02/2022   Procedure: CARDIOVERSION;  Surgeon: Mady Bruckner, MD;  Location: [facility];  Service: Cardiovascular;  Laterality: N/A;   CARPAL TUNNEL RELEASE Bilateral 2008   EYE SURGERY Right    INJECTION KNEE Left 08/12/2018   Procedure: KNEE INJECTION-LEFT;  Surgeon: Edie Norleen PARAS, MD;  Location: [facility];  Service: Orthopedics;  Laterality: Left;   JOINT REPLACEMENT     LIPOSUCTION Bilateral 01/29/2013   Procedure: LIPOSUCTION;  Surgeon: Estefana Reichert, DO;  Location: [facility];  Service: Plastics;  Laterality: Bilateral;   LIPOSUCTION Right 06/03/2013   Procedure: LIPOSUCTION;  Surgeon: Estefana Reichert, DO;  Location: [facility];  Service: Plastics;  Laterality: Right;   LIPOSUCTION Right 11/18/2023   Procedure: LIPOSUCTION LATERAL RIGHT CHEST;  Surgeon: Lowery Estefana RAMAN, DO;  Location:  SURGERY CENTER;  Service: Plastics;  Laterality: Right;   MASTECTOMY  2012   rt prophalactic mast-snbx   MASTECTOMY MODIFIED RADICAL  2012   left-axillary nodes   NEUROMA SURGERY Bilateral    NOSE SURGERY  2007   PORT-A-CATH REMOVAL     insertion and   REVISION, RECONSTRUCTION, BREAST Right 11/18/2023  Procedure: REVISION RIGHT BREAST RECONSTRUCTION;  Surgeon: Lowery Estefana RAMAN, DO;  Location: [facility];  Service: Plastics;  Laterality: Right;  Excision of scar, capsule contracture right breast , possible liposuction   THROAT SURGERY     TONSILLECTOMY     TOTAL KNEE ARTHROPLASTY Right 08/12/2018   Procedure: TOTAL KNEE ARTHROPLASTY-RIGHT;  Surgeon: Edie Norleen PARAS, MD;  Location: [facility];  Service: Orthopedics;  Laterality: Right;   TOTAL KNEE ARTHROPLASTY Left  09/22/2019   Procedure: TOTAL KNEE ARTHROPLASTY;  Surgeon: Edie Norleen PARAS, MD;  Location: [facility];  Service: Orthopedics;  Laterality: Left;   UVULOPALATOPHARYNGOPLASTY      Family History  Problem Relation Age of Onset   Cancer Mother 109       non hodgkins lymphoma   Cancer Father        neck, throat, lung   Lung cancer Father    Throat cancer Father    Hypertension Sister    Cancer Sister 76       Breast cancer dx'd 04/2011   Hypertension Brother    Breast cancer Maternal Aunt    Liver cancer Maternal Uncle    Cancer Maternal Grandmother        died 3, unknown cancer   Cancer Maternal Grandfather        died in his 76s; unknown cancer   Lung cancer Maternal Uncle    Throat cancer Maternal Uncle    Breast cancer Maternal Aunt    Cancer Cousin        died under the age 66, unknown cancer   Cancer Cousin        died age 66; unknown cancer    Social History:  reports that she has never smoked. She has never been exposed to tobacco smoke. She has never used smokeless tobacco. She reports current alcohol  use. She reports that she does not use drugs.  BMI: Estimated body mass index is 28.36 kg/m as calculated from the following:   Height as of this encounter: 5' 3 (1.6 m).   Weight as of this encounter: 72.6 kg.  Lab Results  Component Value Date   ALBUMIN 4.5 06/12/2023   Diabetes: Patient does not have a diagnosis of diabetes.     Smoking Status:   reports that she has never smoked. She has never been exposed to tobacco smoke. She has never used smokeless tobacco.     Medications Prior to Admission  Medication Sig Dispense Refill   alendronate  (FOSAMAX ) 70 MG tablet Take 70 mg by mouth every Saturday. Take with a full glass of water on an empty stomach.     amoxicillin (AMOXIL) 500 MG capsule Take 2,000 mg by mouth See admin instructions. Take 4 capsules (2000 mg) by mouth 1 hour prior to dental procedures     apixaban  (ELIQUIS ) 5 MG TABS tablet Take 1 tablet (5  mg total) by mouth 2 (two) times daily. 60 tablet 5   diltiazem  (CARDIZEM  CD) 120 MG 24 hr capsule TAKE 1 CAPSULE IN THE MORNING AND AT BEDTIME 180 capsule 3   DULoxetine (CYMBALTA) 60 MG capsule Take 60 mg by mouth in the morning.     fluticasone  (FLONASE ) 50 MCG/ACT nasal spray Place 2 sprays into both nostrils in the morning.     folic acid  (FOLVITE ) 1 MG tablet Take 1 mg by mouth in the morning.     furosemide  (LASIX ) 20 MG tablet Take 20 mg by mouth 2 (two) times daily.  levocetirizine (XYZAL) 5 MG tablet Take 5 mg by mouth every evening.     losartan  (COZAAR ) 25 MG tablet Take 1 tablet (25 mg total) by mouth daily. 90 tablet 3   LYSINE ACETATE PO Take 1,000 mg by mouth daily as needed (fever blister).     magnesium  oxide (MAG-OX) 400 (240 Mg) MG tablet Take 400 mg by mouth once a week.     meloxicam (MOBIC) 7.5 MG tablet Take 7.5 mg by mouth every evening.     montelukast (SINGULAIR) 10 MG tablet Take 1 tablet by mouth at bedtime.     neomycin-polymyxin b -dexamethasone  (MAXITROL) 3.5-10000-0.1 SUSP Place 1 drop into both eyes in the morning and at bedtime.     omeprazole (PRILOSEC) 20 MG capsule Take 20 mg by mouth in the morning and at bedtime.     potassium chloride  SA (KLOR-CON  M) 20 MEQ tablet Take 1 tablet (20 mEq total) by mouth 2 (two) times daily. 180 tablet 3   pregabalin  (LYRICA ) 300 MG capsule Take 300 mg by mouth 2 (two) times daily.     rOPINIRole  (REQUIP ) 1 MG tablet Take 1 mg by mouth at bedtime.     triamcinolone  (KENALOG ) 0.025 % cream Apply 1 Application topically as needed (flare - up).     Vibegron  (GEMTESA ) 75 MG TABS Take 1 tablet (75 mg total) by mouth daily. 90 tablet 3   Vitamin D , Ergocalciferol , (DRISDOL) 1.25 MG (50000 UNIT) CAPS capsule Take 50,000 Units by mouth every Saturday.     zolpidem  (AMBIEN ) 10 MG tablet Take 10 mg by mouth at bedtime.     losartan  (COZAAR ) 25 MG tablet Take 1 tablet (25 mg total) by mouth daily. 30 tablet 0     Physical Exam:  Left shoulder demonstrates painful and guarded motion as noted at her recent office visits.  Globally decreased strength secondary to pain and guarding.  Neurovascular intact in left upper extremity.  Examination otherwise as noted at her recent office visits.  Imaging studies confirm changes consistent with chronic left shoulder rotator cuff tear arthropathy.  Vitals  Temp:  [98.2 F (36.8 C)-98.8 F (37.1 C)] 98.2 F (36.8 C) (06/26 0543) Pulse Rate:  [76-82] 76 (06/26 0543) Resp:  [16] 16 (06/26 0543) BP: (139-168)/(85-87) 162/87 (06/26 0543) SpO2:  [98 %-99 %] 98 % (06/26 0543) Weight:  [72.6 kg] 72.6 kg (06/26 0546)  Assessment/Plan  Impression: Left Rotator Cuff tear with arthropathy  Plan of Action: Procedure(s): ARTHROPLASTY, SHOULDER, TOTAL, REVERSE  Briani Maul M Dontre Laduca 01/16/2024, 6:33 AM Contact # [phone]

## 2024-01-16 NOTE — Anesthesia Postprocedure Evaluation (Signed)
 Anesthesia Post Note  Patient: Krystal [name] Brain  Procedure(s) Performed: ARTHROPLASTY, SHOULDER, TOTAL, REVERSE (Left: Shoulder)     Patient location during evaluation: PACU Anesthesia Type: General Level of consciousness: awake and alert Pain management: pain level controlled Vital Signs Assessment: post-procedure vital signs reviewed and stable Respiratory status: spontaneous breathing, nonlabored ventilation, respiratory function stable and patient connected to nasal cannula oxygen Cardiovascular status: blood pressure returned to baseline and stable Postop Assessment: no apparent nausea or vomiting Anesthetic complications: no   No notable events documented.  Last Vitals:  Vitals:   01/16/24 1047 01/16/24 1053  BP: (!) 158/82   Pulse: 64   Resp: 16   Temp:    SpO2: 100% 93%    Last Pain:  Vitals:   01/16/24 1047  TempSrc:   PainSc: 0-No pain                 [name]

## 2024-01-16 NOTE — Op Note (Signed)
 01/16/2024  9:14 AM  PATIENT:   Krystal [name]  73 y.o. female  PRE-OPERATIVE DIAGNOSIS:  Left Rotator Cuff tear with arthropathy  POST-OPERATIVE DIAGNOSIS: Same  PROCEDURE: Left shoulder reverse arthroplasty lysing a press-fit size 8 Arthrex stem with a neutral metathesis, +3 polyethylene insert, 36/+4 glenosphere and a small/+2 baseplate, 30 mm lag screw  SURGEON:  Shakinah Navis, Franky BATTLE M.D.  ASSISTANTS: Randine Ricks, PA-C  Randine Ricks, PA-C was utilized as an Geophysicist/field seismologist throughout this case, essential for help with positioning the patient, positioning extremity, tissue manipulation, implantation of the prosthesis, suture management, wound closure, and intraoperative decision-making.  ANESTHESIA:   General Endotracheal and interscalene block with Exparel   EBL: 250 cc  SPECIMEN: None  Drains: None   PATIENT DISPOSITION:  PACU - hemodynamically stable.    PLAN OF CARE: Discharge to home  Brief history:  Patient is a 74 year old female who has been followed for chronic and progressively increasing left shoulder pain related to severe rotator cuff tear arthropathy.  She does have a remote history of open rotator cuff repair.  Now presents with increasing functional limitations and failure to respond to prolonged attempts at conservative management, and is brought to the operating today for planned left shoulder reverse arthroplasty.  Preoperatively, I counseled the patient regarding treatment options and risks versus benefits thereof.  Possible surgical complications were all reviewed including potential for bleeding, infection, neurovascular injury, persistent pain, loss of motion, anesthetic complication, failure of the implant, and possible need for additional surgery. They understand and accept and agrees with our planned procedure.   Procedure in detail:  After undergoing routine preop evaluation the patient received prophylactic antibiotics and interscalene block with  Exparel  was established in the holding area by the anesthesia department.  Subsequent placed spine on the operating table and underwent the smooth induction of a general endotracheal anesthesia.  Placed into the beachchair position and appropriately padded and protected.  Left shoulder girdle region was sterilely prepped and draped in standard fashion.  Timeout was called.  Deltopectoral approach left shoulder was made.  Approximately 8 cm incision.  Skin flaps elevated dissection carried deep in the deltopectoral interval was then developed proximal to this with the vein taken laterally.  Vein was noted to have compromised and was ligated distally.  Adhesions were then divided beneath the deltoid including the previous deltoid splitting incision with significant adhesions which were carefully released.  Conjoined tendon mobilized and retracted medially.  Long head biceps tendon was then tenodesed at the upper border the pectoralis major proximal segment unroofed and excised and there was some evidence that had been previously tenodesed somewhat higher in the groove.  The rotator cuff was then split superiorly from the apex of the bicipital groove to the base of the coracoid and the subscap was then separated from the lesser tuberosity using electrocautery the margin was tagged with a pair of grasping suture tape sutures.  Capsular attachments were then divided from the anterior and inferior margins of the humeral neck allowing delivery of the humeral head through the wound.  An extra medullary guide was then used to outline the proposed humeral head resection which we performed with an oscillating saw at approximately 20 degrees of retroversion.  Marginal osteophytes were removed with rondure.  A metal Placed with the cut proximal humeral surface we then exposed the glenoid performing a circumferential labral resection and there was a large ossified segment of the posterior labrum which was excised along with the  labrum.  A guidepin was then directed into the center of the glenoid the glenoid was then reamed with the central followed by the peripheral reamer to a stable subchondral bony bed in preparation completed with a drill and tapped for a 30 mm lag screw.  Irrigation completed.  The baseplate was then inserted with vancomycin  powder applied to the threads of the lag screw and excellent fixation was achieved.  All the prophylaxis was in place using standard technique with excellent fixation.  A 36/+4 glenosphere was then impacted onto the baseplate and the central locking screw was placed.  We then returned our attention back to the humeral metaphysis where the canal was opened and we did note significant osteoporosis of the metaphyseal bone.  We ultimately broached up to a size 8 stem at 20 degrees of retroversion.  A neutral metaphyseal reaming guide was then used to repair the metaphysis.  A trial implant was placed and trial reduction showed good motion stability and soft tissue balance.  Trial was removed.  The canal was irrigated cleaned and dried with vancomycin  powder applied in the canal.  The implant was assembled and inserted with excellent fixation.  Trial reduction performed and +3 poly gave us  the best motion stability and soft tissue balance.  Trial poly was removed.  A final plate was impacted final reduction showed excellent motion stability and soft tissue balance all much to our satisfaction.  We then confirmed good mobility of the subscapularis which was not repaired back to the eyelets on the collar of the implant using the previously placed suture tape sutures.  The arm easily achieved 45 degrees of external rotation without excessive tension on the subscap repair.  At this point final irrigation was completed.  Hemostasis was obtained.  Balance of her vancomycin  powder was then sprayed liberally throughout the deep soft tissue planes.  The deltopectoral interval was reapproximated with a series of  figure-of-eight number Vicryl sutures.  2-0 Monocryl used to close the subcu layer and intracuticular 3-0 Monocryl used to close the skin followed by Steri-Strips and Aquacel dressing.  Left arm placed in a sling.  The patient was awakened, extubated, and taken to the recovery in stable condition.  Franky CHRISTELLA Pointer MD   Contact # [phone]

## 2024-01-16 NOTE — Evaluation (Signed)
 Occupational Therapy Evaluation Patient Details Name: Krystal [name] MRN: [id number] DOB: 12/27/1950 Today's Date: 01/16/2024   History of Present Illness   Krystal [name] is a 73 year old female s/p L  Reverse TSA.  PMH: OA, RTC tear, Anemia, blind R eye, Ca, fibromyalgia, CTC, HTN, RLS, Raynaud's     Clinical Impressions PTA pt lives at home with husband and was indep prior to surgery.  Education completed regarding compensatory strategies for ADL tasks and functional mobility, management of sling, L ROM per specified parameters in the order set as indicated below, positioning of operative arm in sitting and supine and edema control, including use of Iceman Cold Therapy machine. Caregiver present for education, written handouts provided and reviewed using Teach Back and pt/caregiver verbalized/demonstrated understanding. Due to the below listed deficits, pt requires min A assistance with ADL tasks and mod I with functional mobility on level surface amb to and from bathroom. Caregiver will be able to provide necessary level of assistance at discharge. Pt to follow up with MD to progress rehab of the operative shoulder.      If plan is discharge home, recommend the following:   A little help with walking and/or transfers;A little help with bathing/dressing/bathroom;Assistance with cooking/housework;Assist for transportation;Help with stairs or ramp for entrance     Functional Status Assessment   Patient has had a recent decline in their functional status and demonstrates the ability to make significant improvements in function in a reasonable and predictable amount of time.     Equipment Recommendations   None recommended by OT      Precautions/Restrictions   Precautions Precautions: Shoulder Shoulder FF 0-60; ER 0-20; Abd 0-45. ROM for ADL purposes only; AROM elbow/wrist/hand; OK for lap slides and pendulums; Loosen sling from neck when arm is supported in sitting Required  Braces or Orthoses: Sling Restrictions Weight Bearing Restrictions Per Provider Order: Yes LUE Weight Bearing Per Provider Order: Non weight bearing     Mobility Bed Mobility Overal bed mobility:  (up in recliner but educated on bed mobility)                  Transfers Overall transfer level: Modified independent                        Balance Overall balance assessment: No apparent balance deficits (not formally assessed)                                         ADL either performed or assessed with clinical judgement   ADL Overall ADL's : Needs assistance/impaired      Per orders, L shoulder parameters as follows for ADL tasks: Abd 0-45; ER 0-20; FF 0-60. While moving within specified parameters, pt/caregiver instructed on bathing and how to donn/doff shirt, placing operative arm through sleeve first when donning and off last when doffing.Pt/caregiver educated on compensatory strategies for LB ADL and strategies to reduce risk of falls.  Pt/caregiver educated on donning/doffing sling and to wear the sling at all times with the exception of ADL, and to loosen the neck strap of the sling when the operative arm is in a supported position when sitting. In sitting or supine, pt instructed to have a pillow behind and under their operative arm to provide support. If assist needed with ambulation, caregiver educated on the importance of walking on pt's  non-operative side.  Education regarding use of IceMan Cold Therapy completed, including the importance of using a barrier on the shoulder prior to positioning the wrap-on pad. Pt/caregiver verbalized/demonstrated understanding. Teach Back used while caregiver assisted with dressing pt and positioning wrap-on pad to facilitate DC.                                        Vision Baseline Vision/History: 0 No visual deficits              Pertinent Vitals/Pain Pain Assessment Pain  Assessment: No/denies pain     Extremity/Trunk Assessment Upper Extremity Assessment Upper Extremity Assessment: Right hand dominant;LUE deficits/detail LUE Deficits / Details: post op n block active with elbow, wrist, hand AAROM WFL's   Lower Extremity Assessment Lower Extremity Assessment: Overall WFL for tasks assessed   Cervical / Trunk Assessment Cervical / Trunk Assessment: Normal   Communication Communication Communication: No apparent difficulties   Cognition Arousal: Alert Behavior During Therapy: WFL for tasks assessed/performed Cognition: No apparent impairments                               Following commands: Intact       Cueing  General Comments   Cueing Techniques: Verbal cues  L post op dressing in place   Exercises Exercises: Shoulder Shoulder Exercises Pendulum Exercise: 10 reps, Left Elbow Flexion: 10 reps, AAROM Elbow Extension: AAROM, 10 reps Wrist Flexion: AAROM, 10 reps Wrist Extension: AAROM, 10 reps Digit Composite Flexion: AAROM, 10 reps Composite Extension: AAROM, 10 reps   Shoulder Instructions Shoulder Instructions Donning/doffing shirt without moving shoulder: Patient able to independently direct caregiver;Caregiver independent with task Method for sponge bathing under operated UE: Caregiver independent with task;Patient able to independently direct caregiver Donning/doffing sling/immobilizer: Caregiver independent with task;Patient able to independently direct caregiver Correct positioning of sling/immobilizer: Caregiver independent with task;Patient able to independently direct caregiver Pendulum exercises (written home exercise program): Caregiver independent with task;Patient able to independently direct caregiver ROM for elbow, wrist and digits of operated UE: Caregiver independent with task;Patient able to independently direct caregiver Sling wearing schedule (on at all times/off for ADL's): Caregiver independent with  task;Patient able to independently direct caregiver Proper positioning of operated UE when showering: Caregiver independent with task;Patient able to independently direct caregiver Positioning of UE while sleeping: Caregiver independent with task;Patient able to independently direct caregiver    Home Living Family/patient expects to be discharged to:: Private residence Living Arrangements: Spouse/significant other Available Help at Discharge: Friend(s) Type of Home: House Home Access: Stairs to enter [employer] of Steps: 3 Entrance Stairs-Rails: Right       Bathroom Shower/Tub: Walk-in shower;Door   [employer] Toilet: Standard Bathroom Accessibility: Yes   Home Equipment: Agricultural consultant (2 wheels)   Additional Comments: husband present for all training who will assist 24/7      Prior Functioning/Environment Prior Level of Function : Independent/Modified Independent                    OT Problem List: Impaired UE functional use    AM-PAC OT 6 Clicks Daily Activity     Outcome Measure Help from another person eating meals?: None Help from another person taking care of personal grooming?: None Help from another person toileting, which includes using toliet, bedpan, or urinal?: A Little Help from  another person bathing (including washing, rinsing, drying)?: A Little Help from another person to put on and taking off regular upper body clothing?: A Little Help from another person to put on and taking off regular lower body clothing?: A Little 6 Click Score: 20   End of Session Equipment Utilized During Treatment: Gait belt Nurse Communication: Mobility status;Weight bearing status;Precautions  Activity Tolerance: Patient tolerated treatment well Patient left: in chair;with call bell/phone within reach;with family/visitor present;with nursing/sitter in room  OT Visit Diagnosis:  (L UE dysfuntion)                Time: 1100-1120 OT Time Calculation (min):  20 min Charges:  OT General Charges $OT Visit: 1 Visit OT Evaluation $OT Eval Low Complexity: 1 Low  Seaira Byus OT/L Acute Rehabilitation Department  [phone]  01/16/2024, 12:45 PM

## 2024-01-16 NOTE — Anesthesia Procedure Notes (Signed)
 Procedure Name: Intubation Date/Time: 01/16/2024 7:46 AM  Performed by: Nanci Riis, CRNAPre-anesthesia Checklist: Patient identified, Emergency Drugs available, Suction available and Patient being monitored Patient Re-evaluated:Patient Re-evaluated prior to induction Oxygen Delivery Method: Circle System Utilized Preoxygenation: Pre-oxygenation with 100% oxygen Induction Type: IV induction Ventilation: Mask ventilation without difficulty Laryngoscope Size: Miller and 3 Grade View: Grade I Tube type: Oral Tube size: 7.0 mm Number of attempts: 1 Airway Equipment and Method: Stylet and Oral airway Placement Confirmation: ETT inserted through vocal cords under direct vision, positive ETCO2 and breath sounds checked- equal and bilateral Secured at: 21 cm Tube secured with: Tape Dental Injury: Teeth and Oropharynx as per pre-operative assessment

## 2024-01-16 NOTE — Transfer of Care (Signed)
 Immediate Anesthesia Transfer of Care Note  Patient: Krystal [name]  Procedure(s) Performed: ARTHROPLASTY, SHOULDER, TOTAL, REVERSE (Left: Shoulder)  Patient Location: PACU  Anesthesia Type:GA combined with regional for post-op pain  Level of Consciousness: drowsy and patient cooperative  Airway & Oxygen Therapy: Patient Spontanous Breathing  Post-op Assessment: Report given to RN and Post -op Vital signs reviewed and stable  Post vital signs: Reviewed and stable  Last Vitals:  Vitals Value Taken Time  BP 178/92 01/16/24 09:27  Temp    Pulse 79 01/16/24 09:28  Resp 17 01/16/24 09:28  SpO2 88 % 01/16/24 09:28  Vitals shown include unfiled device data.  Last Pain:  Vitals:   01/16/24 0546  TempSrc:   PainSc: 3       Patients Stated Pain Goal: 5 (01/16/24 0546)  Complications: No notable events documented.

## 2024-01-20 ENCOUNTER — Encounter (HOSPITAL_COMMUNITY): Payer: Self-pay | Admitting: Orthopedic Surgery

## 2024-01-22 ENCOUNTER — Ambulatory Visit: Attending: Orthopedic Surgery

## 2024-01-22 DIAGNOSIS — M25512 Pain in left shoulder: Secondary | ICD-10-CM | POA: Diagnosis not present

## 2024-01-22 DIAGNOSIS — G8929 Other chronic pain: Secondary | ICD-10-CM | POA: Diagnosis not present

## 2024-01-22 DIAGNOSIS — M25511 Pain in right shoulder: Secondary | ICD-10-CM | POA: Diagnosis not present

## 2024-01-22 DIAGNOSIS — M6281 Muscle weakness (generalized): Secondary | ICD-10-CM | POA: Insufficient documentation

## 2024-01-22 DIAGNOSIS — M25612 Stiffness of left shoulder, not elsewhere classified: Secondary | ICD-10-CM | POA: Insufficient documentation

## 2024-01-22 NOTE — Therapy (Signed)
 OUTPATIENT PHYSICAL THERAPY SHOULDER/ELBOW TREATMENT/EVALUATION   Patient Name: Krystal [name] MRN: [id number] DOB:[birth year], 73 y.o., female Today's Date: 01/22/2024  END OF SESSION:  PT End of Session - 01/22/24 1817     Visit Number 3    Number of Visits 17    Date for PT Re-Evaluation 02/21/24    PT Start Time 1817    PT Stop Time 1900    PT Time Calculation (min) 43 min    Activity Tolerance Patient tolerated treatment well    Behavior During Therapy [facility] for tasks assessed/performed          Past Medical History:  Diagnosis Date   Anemia    Arthritis    osteo   Blindness of right eye at birth   Cancer [facility]) 2011   Left breast 2011 and cervical age 32   Carpal tunnel syndrome    Cataract    Depression    Edema 07/27/2011   Fibromyalgia    muscle weakness and pain   GERD (gastroesophageal reflux disease)    Hyperlipidemia    Hypertension    benign   Lymphedema    Osteoporosis    Plantar fasciitis    Pre-diabetes    Raynaud's disease    Restless leg syndrome    Rosacea    Rotator cuff rupture    Sleep apnea    Synovitis    Ulcer    Past Surgical History:  Procedure Laterality Date   ABDOMINAL HYSTERECTOMY  1986   For bleeding and pain   ATRIAL FIBRILLATION ABLATION N/A 05/14/2023   Procedure: ATRIAL FIBRILLATION ABLATION;  Surgeon: Cindie Ole DASEN, MD;  Location: [facility];  Service: Cardiovascular;  Laterality: N/A;   BLADDER SURGERY  2006   Bladder tack   BREAST RECONSTRUCTION Bilateral 09/17/2012   Procedure: BREAST RECONSTRUCTION;  Surgeon: Estefana Reichert, DO;  Location: [facility];  Service: Plastics;  Laterality: Bilateral;  BILATERAL BREAST RECONSTRUCTION WITH TISSUE EXPANDERS AND ALLOMED   BREAST RECONSTRUCTION Right 06/03/2013   Procedure: RIGHT BREAST CAPSULE CONSTRACTURE;  Surgeon: Estefana Reichert, DO;  Location: [facility];  Service: Plastics;  Laterality: Right;   CAPSULECTOMY Right 11/18/2023    Procedure: EXCISION OF RIGHT BREAST CAPSULE CONTRACTURE AND BREAST TISSUE;  Surgeon: Lowery Estefana RAMAN, DO;  Location: [facility];  Service: Plastics;  Laterality: Right;   CARDIOVERSION N/A 08/06/2022   Procedure: CARDIOVERSION;  Surgeon: Darron Deatrice LABOR, MD;  Location: [facility];  Service: Cardiovascular;  Laterality: N/A;   CARDIOVERSION N/A 10/02/2022   Procedure: CARDIOVERSION;  Surgeon: Mady Bruckner, MD;  Location: [facility];  Service: Cardiovascular;  Laterality: N/A;   CARPAL TUNNEL RELEASE Bilateral 2008   EYE SURGERY Right    INJECTION KNEE Left 08/12/2018   Procedure: KNEE INJECTION-LEFT;  Surgeon: Edie Norleen PARAS, MD;  Location: [facility];  Service: Orthopedics;  Laterality: Left;   JOINT REPLACEMENT     LIPOSUCTION Bilateral 01/29/2013   Procedure: LIPOSUCTION;  Surgeon: Estefana Reichert, DO;  Location: [facility];  Service: Plastics;  Laterality: Bilateral;   LIPOSUCTION Right 06/03/2013   Procedure: LIPOSUCTION;  Surgeon: Estefana Reichert, DO;  Location: [facility];  Service: Plastics;  Laterality: Right;   LIPOSUCTION Right 11/18/2023   Procedure: LIPOSUCTION LATERAL RIGHT CHEST;  Surgeon: Lowery Estefana RAMAN, DO;  Location: [facility];  Service: Plastics;  Laterality: Right;   MASTECTOMY  2012   rt prophalactic mast-snbx   MASTECTOMY MODIFIED RADICAL  2012   left-axillary nodes   NEUROMA SURGERY Bilateral    NOSE SURGERY  2007   PORT-A-CATH REMOVAL     insertion and   REVERSE SHOULDER ARTHROPLASTY Left 01/16/2024   Procedure: ARTHROPLASTY, SHOULDER, TOTAL, REVERSE;  Surgeon: Melita Drivers, MD;  Location: [facility];  Service: Orthopedics;  Laterality: Left;   REVISION, RECONSTRUCTION, BREAST Right 11/18/2023   Procedure: REVISION RIGHT BREAST RECONSTRUCTION;  Surgeon: Lowery Estefana RAMAN, DO;  Location: [facility];  Service: Plastics;  Laterality: Right;  Excision of scar, capsule contracture right breast ,  possible liposuction   THROAT SURGERY     TONSILLECTOMY     TOTAL KNEE ARTHROPLASTY Right 08/12/2018   Procedure: TOTAL KNEE ARTHROPLASTY-RIGHT;  Surgeon: Edie Norleen PARAS, MD;  Location: [facility];  Service: Orthopedics;  Laterality: Right;   TOTAL KNEE ARTHROPLASTY Left 09/22/2019   Procedure: TOTAL KNEE ARTHROPLASTY;  Surgeon: Edie Norleen PARAS, MD;  Location: [facility];  Service: Orthopedics;  Laterality: Left;   UVULOPALATOPHARYNGOPLASTY     Patient Active Problem List   Diagnosis Date Noted   Raynaud's disease 11/05/2022   Osteoporosis, post-menopausal 11/05/2022   Atrial flutter (HCC) 10/02/2022   First degree AV block 09/11/2022   Arthritis 08/08/2022   Persistent atrial fibrillation (HCC) 08/06/2022   Depression 02/27/2022   Gastroesophageal reflux disease 02/27/2022   Hypertension 02/27/2022   Abnormal LFTs 02/27/2022   Alcohol  abuse 02/27/2022   Atrial fibrillation with RVR (HCC) 02/27/2022   Morbid obesity (HCC) 02/27/2022   Pes anserinus bursitis of left knee 02/08/2020   Status post total knee replacement using cement, left 09/22/2019   Status post total knee replacement using cement, right 08/12/2018   Rotator cuff rupture    Status post bilateral breast reconstruction 02/06/2013   Acquired absence of bilateral breasts and nipples 09/17/2012   Breast cancer of lower-outer quadrant of left female breast (HCC) 01/16/2011    PCP: Valora Agent, MD   REFERRING PROVIDER:  Andris Estefana BRAVO, PA-C   REFERRING DIAG:  M75.101 (ICD-10-CM) - Unspecified rotator cuff tear or rupture of right shoulder, not specified as traumatic    RATIONALE FOR EVALUATION AND TREATMENT: Rehabilitation  THERAPY DIAG: Stiffness of left shoulder, not elsewhere classified  Muscle weakness (generalized)  Chronic pain of both shoulders  ONSET DATE: 11/18/2023  FOLLOW-UP APPT SCHEDULED WITH REFERRING PROVIDER: Yes    SUBJECTIVE:                                                                                                                                                                                          SUBJECTIVE STATEMENT:    Patient with chief  concern of bilateral shoulder pain and limited ROM.   PERTINENT HISTORY:   REEVAL (01/22/2024):  Patient s/p 01/16/2024 Reverse Total Shoulder in the L arm. Patient reports 3/10 NPS in the Left Shoulder. Things have going well since the surgery. She has been doing dishes, lifting small objects, and small dog. She is adherent to her HEP exercises and utilizes ice for pain modulation. She is still active and doing activities as tolerated. Difficulty with reaching and picking up items with her RUE due to grip weakness and shoulder weakness.   Presents to OPPT with LLE in a shoulder sling.    INITIAL EVAL Patient is a 73 year old female with history of breast cancer. She most recently underwent excision of right breast capsule contracture and right breast tissue, advancement of inferior right chest and liposuction of the lateral right chest with Dr. Lowery on 11/18/2023. She she denies any fevers or chills.  No stitches or soreness along the R breast tissue/shoulder. She denies any drainage. She also reports that she has been having some difficulties with range of motion of her right upper extremity.   Pt reports that she has difficulties with lifting a gallon of milk to a top shelf with her RUE; she has to use her L arm to assist her R arm. She reports pain in both arms but R > L. Her L arm is starting to develop pain due to compensating for the R arm. Denies n/t.   She has lifting restrictions, no greater than 10 pounds for 6 weeks postoperatively. (12/30/2023)   Dominant hand: right   PAIN:  Pain Intensity: Present: 1/10, Best: 3/10, Worst: 10/10 Pain location: Bilateral Shoulder  Pain Quality: constant, sharp, and aching  Radiating: Yes  Numbness/Tingling: No Focal Weakness: Yes Aggravating factors: Repetitive Overhead  Movements, Laying on R shoulder,  Relieving factors: Ice Modalities 24-hour pain behavior: Activity Dependent History of prior shoulder or neck/shoulder injury, pain, surgery, or therapy: Yes Imaging: Yes  Red flags (personal history of cancer, chills/fever, night sweats, nausea, vomiting, unrelenting pain, unexplained weight gain/loss): Negative  PRECAUTIONS: None  WEIGHT BEARING RESTRICTIONS: Yes She has lifting restrictions, no greater than 10 pounds for 6 weeks postoperatively.  FALLS: Has patient fallen in last 6 months? No  Living Environment Lives with: lives with their family Lives in: House/apartment Stairs: Yes: Internal: 13 steps; on right going up and External: 5 steps; can reach both Has following equipment at home: None  Prior level of function: Independent  Occupational demands: Retired   Hobbies: Dog (105 yr old)  Patient Goals: Movement and less pain    OBJECTIVE:   Patient Surveys  FOTO: QuickDASH:  Cognition Patient is oriented to person, place, and time.  Recent memory is intact.  Remote memory is intact.  Attention span and concentration are intact.  Expressive speech is intact.  Patient's fund of knowledge is within normal limits for educational level.    Gross Musculoskeletal Assessment Tremor: None Bulk: Normal Tone: Normal   Posture Seated: Forward, Rounded Shoulder, increase thoracic kyphosis  Cervical Screen AROM: WFL and painless with overpressure in all planes Spurlings A (ipsilateral lateral flexion/axial compression): R: Negative L: Not examined Spurlings B (ipsilateral lateral flexion/contralateral rotation/axial compression): R: Not examined L: Not examined Repeated movement:Deferred a   AROM AROM (Normal range in degrees) AROM/PROM  Cervical  Flexion (50) WNL  Extension (80) WNL  Right lateral flexion (45) WNL  Left lateral flexion (45) WNL  Right rotation (85) WNL  Left rotation (85) WNL  Right Left (01/22/24)   Shoulder    Flexion 180 Deferred/150  Extension    Abduction 90/180 Deferred/90  External Rotation 90   Internal Rotation 70/85 Deferred/50  Hands Behind Head WNL WNL  Hands Behind Back WNL Surgical precautions      Elbow    Flexion WNL WNL  Extension WNL WNL  Pronation WNL WNL  Supination WNL WNl  (* = pain; Blank rows = not tested)  UE MMT: MMT (out of 5) Right Left   Cervical (isometric)  Flexion WNL  Extension WNL  Lateral Flexion WNL WNL  Rotation WNL WNL      Shoulder   Flexion 3+ 3+  Extension    Abduction 3+ 4  External rotation 4- 4  Internal rotation 4- 4  Horizontal abduction    Horizontal adduction    Lower Trapezius    Rhomboids        Elbow  Flexion 5 5  Extension 5 5  Pronation 5 5  Supination 5 5      Wrist  Flexion    Extension    Radial deviation    Ulnar deviation        MCP  Flexion    Extension    Abduction    Adduction    (* = pain; Blank rows = not tested)  Sensation Grossly intact to light touch bilateral UE as determined by testing dermatomes C2-T2. Proprioception and hot/cold testing deferred on this date.  Reflexes Deferred   Palpation Location LEFT  RIGHT           Subocciptials    Cervical paraspinals 1 1  Upper Trapezius 1 1  Levator Scapulae    Rhomboid Major/Minor    Sternoclavicular joint    Acromioclavicular joint    Coracoid process    Long head of biceps    Supraspinatus 1 1  Infraspinatus 2 2  Subscapularis 1 1  Teres Minor    Teres Major    Pectoralis Major 1 1  Pectoralis Minor 1 1  Anterior Deltoid    Lateral Deltoid 1 1  Posterior Deltoid 1 1  Latissimus Dorsi    Sternocleidomastoid    (Blank rows = not tested) Graded on 0-4 scale (0 = no pain, 1 = pain, 2 = pain with wincing/grimacing/flinching, 3 = pain with withdrawal, 4 = unwilling to allow palpation), (Blank rows = not tested)   Passive Accessory Intervertebral Motion Deferred  Accessory  Motions/Glides Glenohumeral: Posterior: R: normal L: abnormal Inferior: R: normal L: abnormal Anterior: R: normal L: not examined   SPECIAL TESTS Rotator Cuff  Drop Arm Test: Negative Painful Arc (Pain from 60 to 120 degrees scaption): Positive Infraspinatus Muscle Test: Positive If all 3 tests positive, the probability of a full-thickness rotator cuff tear is 91%   Bicep Tendon Pathology Speed (shoulder flexion to 90, external rotation, full elbow extension, and forearm supination with resistance: Negative Yergason's (resisted shoulder ER and supination/biceps tendon pathology): Negative  RE-EVALUATION (01/22/2024):  Subjective: Patient presenting to OPPT with L shoulder pain s/p  L Reverse Total Shoulder.    HPI: Patient familiar to the clinic, currently being treated for bilateral shoulder pain 2/2 rotator cuff tendinitis. Patient s/p 01/16/2024 Reverse Total Shoulder in the L arm. She reports that she has been doing well since the surgery. She has been performing functional tasks such as dishes, lifting small objects, and a small dog. No precautions or restrictions were given to her at discharge. She has been adherent with  PT exercises given to her following d/c. Currently patient reports 3/10 NPS in the Left Shoulder. Patient reports that the most pain in her L shoulder (8/10 NPS) occurs with her L shoulder resting.   Pain controlled with prescribed medications and ice modalities. She has difficulty with reaching and picking up items with her RUE due to grip weakness and shoulder weakness.   Currently, she arrives to clinic with L arm in shoulder sling.   Objective:    Observation:    Redness and swelling located in the L elbow, upper arm and shoulder.    L AROM:    Elbow: WNL  in flexion and extension   Forearm:    Supination: WNL     Pronation: WNL    L PROM:  Shoulder:     Flexion: 140     Abduction: 90    ER: 50     MMT:   Forearm:    Supination:  5/5    Pronation: 5/5, minor pain in the distal bicep tendon   Grip Strength: WNL  TODAY'S TREATMENT  DATE: 01/22/2024  Therapeutic Exercise:   L PROM Exercises (Supine):   L GHJ    Abd: 3 x 10 to 90   Flex: 3 x 10 to 150   External Rotation: 3 x 10 to 50   L AROM Exercises (Supine):   Elbow:     Flex: 2 x 20, AROM, 2 x 10 against PT resistance (applied at distal wrist)    Ext: 2 x 20 against PT resistance (Applied at distal wrist)      PATIENT EDUCATION:  Education details: POC, HEP, Prognosis Person educated: Patient Education method: Explanation, Demonstration, and Handouts Education comprehension: verbalized understanding and returned demonstration   HOME EXERCISE PROGRAM:  Access Code: [email or web address] URL: [email or web address] Date: 01/01/2024 Prepared by: Lonni Rosene Pilling  Exercises - Seated Shoulder Flexion Towel Slide at Table Top  - 1 x daily - 7 x weekly - 3 sets - 10 reps - Seated Shoulder Abduction AAROM with Pulley at Side  - 1 x daily - 7 x weekly - 3 sets - 20 reps - Standing Shoulder Flexion AAROM with Arms Bent and Pulley Behind  - 1 x daily - 7 x weekly - 3 sets - 20 reps - Seated Single Arm Shoulder External Rotation with Towel  - 1 x daily - 3-4 x weekly - 2-3 sets - 10 reps - Supine Shoulder External and Internal Rotation in Abduction with Dumbbell  - 1 x daily - 3-4 x weekly - 2-3 sets - 10 reps - Standing Isometric Shoulder External Rotation with Doorway and Towel Roll  - 1 x daily - 3-4 x weekly - 3 sets - 10s hold - Standing Single Arm Shoulder Abduction with Dumbbell - Palm Down  - 1 x daily - 3-4 x weekly - 2-3 sets - 10 reps   Access Code: [email or web address] URL: [email or web address] Date: 12/26/2023 Prepared by: Lonni Pall  Exercises - Seated Shoulder Abduction AAROM with Pulley at Side  - 1 x daily - 7 x weekly - 3 sets - 20 reps - Standing Shoulder Flexion AAROM with Arms Bent and Pulley Behind  - 1 x daily - 7 x  weekly - 3 sets - 20 reps - Seated Single Arm Shoulder External Rotation with Towel  - 1 x daily - 3-4 x weekly - 2-3 sets - 10 reps - Supine Shoulder External and Internal Rotation in Abduction with Dumbbell  - 1 x daily -  3-4 x weekly - 2-3 sets - 10 reps  ASSESSMENT:  CLINICAL IMPRESSION: PT re-evaluation conducted due to recent L Reverse Total Shoulder Replacement 01/15/2024. She is 1 week post op and patient is progressing appropriately as expected. Patient remains active and utilizes LUE with functional activities as tolerated. Objective findings include visible swelling and mild redness around the surgical site of the left shoulder, upper arm, and elbow. PROM testing revealed functional yet limited L shoulder mobility (Flexion: 140, Abduction: 90, ER: 50). Elbow and forearm motion and strength are within normal limits, though minor pain was elicited with resisted forearm pronation in the distal biceps region. Gross grip strength remains within normal limits. Patient participated in today's treatment session without adverse response. Passive ROM exercises targeting L shoulder elevation and ER were performed within post-op protocols, active elbow strengthening against light PT resistance within patient tolerance. PT educated patient on reducing AROM of the LUE and limiting IR in order to protect surgical site.  Patient severely limited based on R GHJ weakness and recent surgery in the L shoulder requiring extensive skilled PT focused safe progression of L PROM, progressive activation of surrounding musculature, and education to prevent compensatory overuse of RUE.  Based on today's performance, pt will continue to benefit from skilled PT in order to facilitate return to PLOF and improve QoL.     OBJECTIVE IMPAIRMENTS: decreased endurance, decreased mobility, decreased ROM, decreased strength, hypomobility, increased muscle spasms, and pain.   ACTIVITY LIMITATIONS: carrying, lifting, sleeping,  reach over head, and hygiene/grooming  PARTICIPATION LIMITATIONS: meal prep, cleaning, and laundry  PERSONAL FACTORS: Age, Behavior pattern, Past/current experiences, Time since onset of injury/illness/exacerbation, and 1-2 comorbidities: s/p Capsulectomy, HTN are also affecting patient's functional outcome.   REHAB POTENTIAL: Fair Patient with significant weakness in R shoulder in abduction and flexion with visible shoulder shrug sign. L GHJ developing rotator cuff pathologies due to compensating for R sided weakness.   CLINICAL DECISION MAKING: Evolving/moderate complexity  EVALUATION COMPLEXITY: Moderate   GOALS: Goals reviewed with patient? Yes  SHORT TERM GOALS: Target date: 02/19/2024   Pt will be independent with HEP to improve strength and decrease shoulder pain to improve pain-free function at home and work. Baseline: Initial HEP provided Goal status: INITIAL   LONG TERM GOALS: Target date: 03/18/2024   Pt will increase R Shoulder abd to 110 without scapular elevation in order to demonstrate significant improvements in shoulder functional and overheads reaching.  Baseline: 12/27/2023: ~90 Goal status: INITIAL  2.  Pt will decrease worst R shoulder pain by at least 3 points on the NPRS in order to demonstrate clinically significant reduction in shoulder pain. Baseline: 12/27/2023: 10/10 NPS Goal status: INITIAL  3.  Pt will decrease quick DASH score by at least 8% in order to demonstrate clinically significant reduction in disability related to shoulder pain        Baseline: Deferred to next visit Goal status: INITIAL  4. Pt will increase R shoulder flex/abd, IR/ER strength by at least 1/2 MMT grade in order to demonstrate improvement in strength and function Baseline:  Flexion 3+ 3+  Extension    Abduction 3+ 4  External rotation 4- 4  Internal rotation 4- 4   Goal status: INITIAL  5.  Pt will increase L shoulder flex/abd AROM to 110 in order to demonstrate  improvements in ROM and functional mobility.         Baseline: 01/21/2024: 0 due to surgical protocol.  Goal status: INITIAL    PLAN:  PT FREQUENCY: 1-2x/week  PT DURATION: 8 weeks  PLANNED INTERVENTIONS: Therapeutic exercises, Therapeutic activity, Neuromuscular re-education, Balance training, Gait training, Patient/Family education, Self Care, Joint mobilization, Joint manipulation, Vestibular training, Canalith repositioning, Orthotic/Fit training, DME instructions, Dry Needling, Electrical stimulation, Spinal manipulation, Spinal mobilization, Cryotherapy, Moist heat, Taping, Traction, Ultrasound, Ionotophoresis 4mg /ml Dexamethasone , Manual therapy, and Re-evaluation.  PLAN FOR NEXT SESSION: Review HEP, Initiate Shoulder strengthening, rotator cuff strength, parascapular strength   Lonni Pall PT, DPT Physical Therapist- [place]  01/22/2024, 6:19 PM

## 2024-01-27 ENCOUNTER — Ambulatory Visit

## 2024-01-27 DIAGNOSIS — M25612 Stiffness of left shoulder, not elsewhere classified: Secondary | ICD-10-CM

## 2024-01-27 DIAGNOSIS — M6281 Muscle weakness (generalized): Secondary | ICD-10-CM

## 2024-01-27 DIAGNOSIS — M25511 Pain in right shoulder: Secondary | ICD-10-CM | POA: Diagnosis not present

## 2024-01-27 DIAGNOSIS — G8929 Other chronic pain: Secondary | ICD-10-CM

## 2024-01-27 DIAGNOSIS — M25512 Pain in left shoulder: Secondary | ICD-10-CM | POA: Diagnosis not present

## 2024-01-27 NOTE — Therapy (Signed)
 OUTPATIENT PHYSICAL THERAPY SHOULDER/ELBOW TREATMENT  Patient Name: Krystal [name] MRN: [id number] DOB:1951-05-22, 73 y.o., female Today's Date: 01/27/2024  END OF SESSION:  PT End of Session - 01/27/24 1817     Visit Number 4    Number of Visits 17    Date for PT Re-Evaluation 02/21/24    PT Start Time 1816    PT Stop Time 1900    PT Time Calculation (min) 44 min    Activity Tolerance Patient tolerated treatment well    Behavior During Therapy [facility] for tasks assessed/performed          Past Medical History:  Diagnosis Date   Anemia    Arthritis    osteo   Blindness of right eye at birth   Cancer [facility]) 2011   Left breast 2011 and cervical age 26   Carpal tunnel syndrome    Cataract    Depression    Edema 07/27/2011   Fibromyalgia    muscle weakness and pain   GERD (gastroesophageal reflux disease)    Hyperlipidemia    Hypertension    benign   Lymphedema    Osteoporosis    Plantar fasciitis    Pre-diabetes    Raynaud's disease    Restless leg syndrome    Rosacea    Rotator cuff rupture    Sleep apnea    Synovitis    Ulcer    Past Surgical History:  Procedure Laterality Date   ABDOMINAL HYSTERECTOMY  1986   For bleeding and pain   ATRIAL FIBRILLATION ABLATION N/A 05/14/2023   Procedure: ATRIAL FIBRILLATION ABLATION;  Surgeon: Cindie Ole DASEN, MD;  Location: [facility];  Service: Cardiovascular;  Laterality: N/A;   BLADDER SURGERY  2006   Bladder tack   BREAST RECONSTRUCTION Bilateral 09/17/2012   Procedure: BREAST RECONSTRUCTION;  Surgeon: Estefana Reichert, DO;  Location: [facility];  Service: Plastics;  Laterality: Bilateral;  BILATERAL BREAST RECONSTRUCTION WITH TISSUE EXPANDERS AND ALLOMED   BREAST RECONSTRUCTION Right 06/03/2013   Procedure: RIGHT BREAST CAPSULE CONSTRACTURE;  Surgeon: Estefana Reichert, DO;  Location: [facility];  Service: Plastics;  Laterality: Right;   CAPSULECTOMY Right 11/18/2023   Procedure: EXCISION  OF RIGHT BREAST CAPSULE CONTRACTURE AND BREAST TISSUE;  Surgeon: Lowery Estefana RAMAN, DO;  Location: [facility];  Service: Plastics;  Laterality: Right;   CARDIOVERSION N/A 08/06/2022   Procedure: CARDIOVERSION;  Surgeon: Darron Deatrice LABOR, MD;  Location: [facility];  Service: Cardiovascular;  Laterality: N/A;   CARDIOVERSION N/A 10/02/2022   Procedure: CARDIOVERSION;  Surgeon: Mady Bruckner, MD;  Location: [facility];  Service: Cardiovascular;  Laterality: N/A;   CARPAL TUNNEL RELEASE Bilateral 2008   EYE SURGERY Right    INJECTION KNEE Left 08/12/2018   Procedure: KNEE INJECTION-LEFT;  Surgeon: Edie Norleen PARAS, MD;  Location: [facility];  Service: Orthopedics;  Laterality: Left;   JOINT REPLACEMENT     LIPOSUCTION Bilateral 01/29/2013   Procedure: LIPOSUCTION;  Surgeon: Estefana Reichert, DO;  Location: [facility];  Service: Plastics;  Laterality: Bilateral;   LIPOSUCTION Right 06/03/2013   Procedure: LIPOSUCTION;  Surgeon: Estefana Reichert, DO;  Location: [facility];  Service: Plastics;  Laterality: Right;   LIPOSUCTION Right 11/18/2023   Procedure: LIPOSUCTION LATERAL RIGHT CHEST;  Surgeon: Lowery Estefana RAMAN, DO;  Location: [facility];  Service: Plastics;  Laterality: Right;   MASTECTOMY  2012   rt prophalactic mast-snbx   MASTECTOMY MODIFIED RADICAL  2012  left-axillary nodes   NEUROMA SURGERY Bilateral    NOSE SURGERY  2007   PORT-A-CATH REMOVAL     insertion and   REVERSE SHOULDER ARTHROPLASTY Left 01/16/2024   Procedure: ARTHROPLASTY, SHOULDER, TOTAL, REVERSE;  Surgeon: Melita Drivers, MD;  Location: [facility];  Service: Orthopedics;  Laterality: Left;   REVISION, RECONSTRUCTION, BREAST Right 11/18/2023   Procedure: REVISION RIGHT BREAST RECONSTRUCTION;  Surgeon: Lowery Estefana RAMAN, DO;  Location: [facility];  Service: Plastics;  Laterality: Right;  Excision of scar, capsule contracture right breast , possible liposuction    THROAT SURGERY     TONSILLECTOMY     TOTAL KNEE ARTHROPLASTY Right 08/12/2018   Procedure: TOTAL KNEE ARTHROPLASTY-RIGHT;  Surgeon: Edie Norleen PARAS, MD;  Location: [facility];  Service: Orthopedics;  Laterality: Right;   TOTAL KNEE ARTHROPLASTY Left 09/22/2019   Procedure: TOTAL KNEE ARTHROPLASTY;  Surgeon: Edie Norleen PARAS, MD;  Location: [facility];  Service: Orthopedics;  Laterality: Left;   UVULOPALATOPHARYNGOPLASTY     Patient Active Problem List   Diagnosis Date Noted   Raynaud's disease 11/05/2022   Osteoporosis, post-menopausal 11/05/2022   Atrial flutter (HCC) 10/02/2022   First degree AV block 09/11/2022   Arthritis 08/08/2022   Persistent atrial fibrillation (HCC) 08/06/2022   Depression 02/27/2022   Gastroesophageal reflux disease 02/27/2022   Hypertension 02/27/2022   Abnormal LFTs 02/27/2022   Alcohol  abuse 02/27/2022   Atrial fibrillation with RVR (HCC) 02/27/2022   Morbid obesity (HCC) 02/27/2022   Pes anserinus bursitis of left knee 02/08/2020   Status post total knee replacement using cement, left 09/22/2019   Status post total knee replacement using cement, right 08/12/2018   Rotator cuff rupture    Status post bilateral breast reconstruction 02/06/2013   Acquired absence of bilateral breasts and nipples 09/17/2012   Breast cancer of lower-outer quadrant of left female breast (HCC) 01/16/2011    PCP: Valora Agent, MD   REFERRING PROVIDER:  Andris Estefana BRAVO, PA-C   REFERRING DIAG:  M75.101 (ICD-10-CM) - Unspecified rotator cuff tear or rupture of right shoulder, not specified as traumatic    RATIONALE FOR EVALUATION AND TREATMENT: Rehabilitation  THERAPY DIAG: Stiffness of left shoulder, not elsewhere classified  Muscle weakness (generalized)  Chronic pain of both shoulders  ONSET DATE: 11/18/2023  FOLLOW-UP APPT SCHEDULED WITH REFERRING PROVIDER: Yes    SUBJECTIVE:                                                                                                                                                                                          SUBJECTIVE STATEMENT:    Patient with chief concern of bilateral  shoulder pain and limited ROM.   PERTINENT HISTORY:   REEVAL (01/22/2024):  Patient s/p 01/16/2024 Reverse Total Shoulder in the L arm. Patient reports 3/10 NPS in the Left Shoulder. Things have going well since the surgery. She has been doing dishes, lifting small objects, and small dog. She is adherent to her HEP exercises and utilizes ice for pain modulation. She is still active and doing activities as tolerated. Difficulty with reaching and picking up items with her RUE due to grip weakness and shoulder weakness.   Presents to OPPT with LLE in a shoulder sling.    INITIAL EVAL Patient is a 73 year old female with history of breast cancer. She most recently underwent excision of right breast capsule contracture and right breast tissue, advancement of inferior right chest and liposuction of the lateral right chest with Dr. Lowery on 11/18/2023. She she denies any fevers or chills.  No stitches or soreness along the R breast tissue/shoulder. She denies any drainage. She also reports that she has been having some difficulties with range of motion of her right upper extremity.   Pt reports that she has difficulties with lifting a gallon of milk to a top shelf with her RUE; she has to use her L arm to assist her R arm. She reports pain in both arms but R > L. Her L arm is starting to develop pain due to compensating for the R arm. Denies n/t.   She has lifting restrictions, no greater than 10 pounds for 6 weeks postoperatively. (12/30/2023)   Dominant hand: right   PAIN:  Pain Intensity: Present: 1/10, Best: 3/10, Worst: 10/10 Pain location: Bilateral Shoulder  Pain Quality: constant, sharp, and aching  Radiating: Yes  Numbness/Tingling: No Focal Weakness: Yes Aggravating factors: Repetitive Overhead Movements, Laying on R  shoulder,  Relieving factors: Ice Modalities 24-hour pain behavior: Activity Dependent History of prior shoulder or neck/shoulder injury, pain, surgery, or therapy: Yes Imaging: Yes  Red flags (personal history of cancer, chills/fever, night sweats, nausea, vomiting, unrelenting pain, unexplained weight gain/loss): Negative  PRECAUTIONS: None  WEIGHT BEARING RESTRICTIONS: Yes She has lifting restrictions, no greater than 10 pounds for 6 weeks postoperatively.  FALLS: Has patient fallen in last 6 months? No  Living Environment Lives with: lives with their family Lives in: House/apartment Stairs: Yes: Internal: 13 steps; on right going up and External: 5 steps; can reach both Has following equipment at home: None  Prior level of function: Independent  Occupational demands: Retired   Hobbies: Dog (66 yr old)  Patient Goals: Movement and less pain    OBJECTIVE:   Patient Surveys  FOTO: QuickDASH:  Cognition Patient is oriented to person, place, and time.  Recent memory is intact.  Remote memory is intact.  Attention span and concentration are intact.  Expressive speech is intact.  Patient's fund of knowledge is within normal limits for educational level.    Gross Musculoskeletal Assessment Tremor: None Bulk: Normal Tone: Normal   Posture Seated: Forward, Rounded Shoulder, increase thoracic kyphosis  Cervical Screen AROM: WFL and painless with overpressure in all planes Spurlings A (ipsilateral lateral flexion/axial compression): R: Negative L: Not examined Spurlings B (ipsilateral lateral flexion/contralateral rotation/axial compression): R: Not examined L: Not examined Repeated movement:Deferred a   AROM AROM (Normal range in degrees) AROM/PROM  Cervical  Flexion (50) WNL  Extension (80) WNL  Right lateral flexion (45) WNL  Left lateral flexion (45) WNL  Right rotation (85) WNL  Left rotation (85) WNL   Right  Left (01/22/24)  Shoulder    Flexion  180 Deferred/150  Extension    Abduction 90/180 Deferred/90  External Rotation 90   Internal Rotation 70/85 Deferred/50  Hands Behind Head WNL WNL  Hands Behind Back WNL Surgical precautions      Elbow    Flexion WNL WNL  Extension WNL WNL  Pronation WNL WNL  Supination WNL WNl  (* = pain; Blank rows = not tested)  UE MMT: MMT (out of 5) Right Left   Cervical (isometric)  Flexion WNL  Extension WNL  Lateral Flexion WNL WNL  Rotation WNL WNL      Shoulder   Flexion 3+ 3+  Extension    Abduction 3+ 4  External rotation 4- 4  Internal rotation 4- 4  Horizontal abduction    Horizontal adduction    Lower Trapezius    Rhomboids        Elbow  Flexion 5 5  Extension 5 5  Pronation 5 5  Supination 5 5      Wrist  Flexion    Extension    Radial deviation    Ulnar deviation        MCP  Flexion    Extension    Abduction    Adduction    (* = pain; Blank rows = not tested)  Sensation Grossly intact to light touch bilateral UE as determined by testing dermatomes C2-T2. Proprioception and hot/cold testing deferred on this date.  Reflexes Deferred   Palpation Location LEFT  RIGHT           Subocciptials    Cervical paraspinals 1 1  Upper Trapezius 1 1  Levator Scapulae    Rhomboid Major/Minor    Sternoclavicular joint    Acromioclavicular joint    Coracoid process    Long head of biceps    Supraspinatus 1 1  Infraspinatus 2 2  Subscapularis 1 1  Teres Minor    Teres Major    Pectoralis Major 1 1  Pectoralis Minor 1 1  Anterior Deltoid    Lateral Deltoid 1 1  Posterior Deltoid 1 1  Latissimus Dorsi    Sternocleidomastoid    (Blank rows = not tested) Graded on 0-4 scale (0 = no pain, 1 = pain, 2 = pain with wincing/grimacing/flinching, 3 = pain with withdrawal, 4 = unwilling to allow palpation), (Blank rows = not tested)   Passive Accessory Intervertebral Motion Deferred  Accessory Motions/Glides Glenohumeral: Posterior: R: normal L:  abnormal Inferior: R: normal L: abnormal Anterior: R: normal L: not examined   SPECIAL TESTS Rotator Cuff  Drop Arm Test: Negative Painful Arc (Pain from 60 to 120 degrees scaption): Positive Infraspinatus Muscle Test: Positive If all 3 tests positive, the probability of a full-thickness rotator cuff tear is 91%   Bicep Tendon Pathology Speed (shoulder flexion to 90, external rotation, full elbow extension, and forearm supination with resistance: Negative Yergason's (resisted shoulder ER and supination/biceps tendon pathology): Negative  TODAY'S TREATMENT: DATE: 01/27/2024  Subjective: Patient reports 4/10 NPS in the LUE; things still going well s/p L reverse total shoulder replacement. Patient reports that she still is very active and performing some iADLs (dishes etc.) within tolerance. She still participates at the senior center and performs activities with resistance bands.  No further questions or concerns.   Therapeutic Exercise:   L PROM Exercises (Supine):   L GHJ    Abd: 2 x 10 to 90, PT Assist, 1 x 10 Self PROM with yard stick  Flexion: 2 x 10, PT Assist, 1 x 10 Self PROM with RUE    External Rotation: 3 x 10 to 50    L AROM Exercises (Supine):   Elbow:     Flexion:      3 x 10 (PT resistance applied at distal wrist)     Ext:      3 x 10 (PT resistance applied at distal wrist)  R Shoulder ER against resistance (elbow supported with pillow roll)   3 x 10, YTB, cued for maximal Rotator cuff activation - only able to achieve 15 against YTB but can AROM up to 50 without resistance  R Shoulder IR against resistance   3 x 10, YTB   R Shoulder Elbow Flexion against resistance   2 x 10, 5#   Therapeutic Activity:  R Shoulder Flexion with Resistance  2 x 10, 1# DB    R Shoulder Abd with resistance (visible shrug sign past 60)  2 x 10, 1# DB   R Shoulder OH press with resistance   2 x 10, 2#   - Cues for elbow flexion prior to [facility] press for proper  sequencing  - quickly fatigues after 5 reps requiring PT assist in order to complete set      PATIENT EDUCATION:  Education details: POC, HEP, Prognosis Person educated: Patient Education method: Explanation, Demonstration, and Handouts Education comprehension: verbalized understanding and returned demonstration   HOME EXERCISE PROGRAM:  Access Code: [email or web address] URL: [email or web address] Date: 01/01/2024 Prepared by: Lonni Marquelle Musgrave  Exercises - Seated Shoulder Flexion Towel Slide at Table Top  - 1 x daily - 7 x weekly - 3 sets - 10 reps - Seated Shoulder Abduction AAROM with Pulley at Side  - 1 x daily - 7 x weekly - 3 sets - 20 reps - Standing Shoulder Flexion AAROM with Arms Bent and Pulley Behind  - 1 x daily - 7 x weekly - 3 sets - 20 reps - Seated Single Arm Shoulder External Rotation with Towel  - 1 x daily - 3-4 x weekly - 2-3 sets - 10 reps - Supine Shoulder External and Internal Rotation in Abduction with Dumbbell  - 1 x daily - 3-4 x weekly - 2-3 sets - 10 reps - Standing Isometric Shoulder External Rotation with Doorway and Towel Roll  - 1 x daily - 3-4 x weekly - 3 sets - 10s hold - Standing Single Arm Shoulder Abduction with Dumbbell - Palm Down  - 1 x daily - 3-4 x weekly - 2-3 sets - 10 reps   Access Code: [email or web address] URL: [email or web address] Date: 12/26/2023 Prepared by: Lonni Pall  Exercises - Seated Shoulder Abduction AAROM with Pulley at Side  - 1 x daily - 7 x weekly - 3 sets - 20 reps - Standing Shoulder Flexion AAROM with Arms Bent and Pulley Behind  - 1 x daily - 7 x weekly - 3 sets - 20 reps - Seated Single Arm Shoulder External Rotation with Towel  - 1 x daily - 3-4 x weekly - 2-3 sets - 10 reps - Supine Shoulder External and Internal Rotation in Abduction with Dumbbell  - 1 x daily - 3-4 x weekly - 2-3 sets - 10 reps  ASSESSMENT:  CLINICAL IMPRESSION: Continued PT POC with a main focus on treatment of R Rotator Cuff  Tendinitis and L Reverse Total Shoulder Replacement. Patient tolerated all activities today without exacerbation pain in bilateral shoulders. PT focused on continued mobility exercises in the L  shoulder within post-op precautions and strengthening for R rotator cuff muscles. She still presents with significant weakness in the R shoulder; visible shrug sign and compensation with upper trapezius and deltoid muscle group during OH motions (R GHJ flexion, abduction).  Patient demonstrated mild scapular dyskinesis during right shoulder abduction (visible shrug past 60) and exhibited early fatigue with overhead pressing, requiring manual assistance to complete the set. Verbal and tactile cues were provided to improve movement sequencing and rotator cuff activation, particularly during resisted external rotation.Her L shoulder continues to respond well to PROM into available ranges of shoulder flexion and abduction up to 90; PT assistance progressed to self PROM using yard stick. She tolerated resistive exercises to the L elbow without pain in the L shoulder. Discoloration and bruising of the LUE improved since the last session. She remains motivated, functionally independent with daily activities, and responsive to therapeutic intervention.  PT plans to continue gradual progression of left shoulder mobility and strength while addressing compensatory patterns and endurance deficits in the right upper extremity.Based on today's performance, pt will continue to benefit from skilled PT in order to facilitate return to PLOF and improve QoL.   OBJECTIVE IMPAIRMENTS: decreased endurance, decreased mobility, decreased ROM, decreased strength, hypomobility, increased muscle spasms, and pain.   ACTIVITY LIMITATIONS: carrying, lifting, sleeping, reach over head, and hygiene/grooming  PARTICIPATION LIMITATIONS: meal prep, cleaning, and laundry  PERSONAL FACTORS: Age, Behavior pattern, Past/current experiences, Time since  onset of injury/illness/exacerbation, and 1-2 comorbidities: s/p Capsulectomy, HTN are also affecting patient's functional outcome.   REHAB POTENTIAL: Fair Patient with significant weakness in R shoulder in abduction and flexion with visible shoulder shrug sign. L GHJ developing rotator cuff pathologies due to compensating for R sided weakness.   CLINICAL DECISION MAKING: Evolving/moderate complexity  EVALUATION COMPLEXITY: Moderate   GOALS: Goals reviewed with patient? Yes  SHORT TERM GOALS: Target date: 02/24/2024   Pt will be independent with HEP to improve strength and decrease shoulder pain to improve pain-free function at home and work. Baseline: Initial HEP provided Goal status: INITIAL   LONG TERM GOALS: Target date: 03/23/2024   Pt will increase R Shoulder abd to 110 without scapular elevation in order to demonstrate significant improvements in shoulder functional and overheads reaching.  Baseline: 12/27/2023: ~90 Goal status: INITIAL  2.  Pt will decrease worst R shoulder pain by at least 3 points on the NPRS in order to demonstrate clinically significant reduction in shoulder pain. Baseline: 12/27/2023: 10/10 NPS Goal status: INITIAL  3.  Pt will decrease quick DASH score by at least 8% in order to demonstrate clinically significant reduction in disability related to shoulder pain        Baseline: Deferred to next visit Goal status: INITIAL  4. Pt will increase R shoulder flex/abd, IR/ER strength by at least 1/2 MMT grade in order to demonstrate improvement in strength and function Baseline:  Flexion 3+ 3+  Extension    Abduction 3+ 4  External rotation 4- 4  Internal rotation 4- 4   Goal status: INITIAL  5.  Pt will increase L shoulder flex/abd AROM to 110 in order to demonstrate improvements in ROM and functional mobility.         Baseline: 01/21/2024: 0 due to surgical protocol.  Goal status: INITIAL    PLAN: PT FREQUENCY: 1-2x/week  PT DURATION: 8  weeks  PLANNED INTERVENTIONS: Therapeutic exercises, Therapeutic activity, Neuromuscular re-education, Balance training, Gait training, Patient/Family education, Self Care, Joint mobilization, Joint manipulation, Vestibular training, Canalith  repositioning, Orthotic/Fit training, DME instructions, Dry Needling, Electrical stimulation, Spinal manipulation, Spinal mobilization, Cryotherapy, Moist heat, Taping, Traction, Ultrasound, Ionotophoresis 4mg /ml Dexamethasone , Manual therapy, and Re-evaluation.  PLAN FOR NEXT SESSION: Review HEP, Initiate Shoulder strengthening, rotator cuff strength, parascapular strength   Lonni Pall PT, DPT Physical Therapist- [place]  01/27/2024, 9:53 PM

## 2024-01-29 DIAGNOSIS — Z96612 Presence of left artificial shoulder joint: Secondary | ICD-10-CM | POA: Diagnosis not present

## 2024-01-29 DIAGNOSIS — Z5189 Encounter for other specified aftercare: Secondary | ICD-10-CM | POA: Diagnosis not present

## 2024-01-29 DIAGNOSIS — M069 Rheumatoid arthritis, unspecified: Secondary | ICD-10-CM | POA: Diagnosis not present

## 2024-01-29 DIAGNOSIS — I509 Heart failure, unspecified: Secondary | ICD-10-CM | POA: Diagnosis not present

## 2024-02-04 ENCOUNTER — Ambulatory Visit

## 2024-02-06 ENCOUNTER — Ambulatory Visit

## 2024-02-06 DIAGNOSIS — M25612 Stiffness of left shoulder, not elsewhere classified: Secondary | ICD-10-CM

## 2024-02-06 DIAGNOSIS — M6281 Muscle weakness (generalized): Secondary | ICD-10-CM

## 2024-02-06 DIAGNOSIS — G8929 Other chronic pain: Secondary | ICD-10-CM

## 2024-02-06 DIAGNOSIS — M25512 Pain in left shoulder: Secondary | ICD-10-CM | POA: Diagnosis not present

## 2024-02-06 DIAGNOSIS — M25511 Pain in right shoulder: Secondary | ICD-10-CM | POA: Diagnosis not present

## 2024-02-06 NOTE — Therapy (Signed)
 OUTPATIENT PHYSICAL THERAPY SHOULDER/ELBOW TREATMENT  Patient Name: Krystal [name] MRN: [id number] DOB:[birth year], 73 y.o., female Today's Date: 02/06/2024  END OF SESSION:  PT End of Session - 02/06/24 1603     Visit Number 5    Number of Visits 17    Date for PT Re-Evaluation 02/21/24    PT Start Time 1602    PT Stop Time 1645    PT Time Calculation (min) 43 min    Activity Tolerance Patient tolerated treatment well    Behavior During Therapy [facility] for tasks assessed/performed          Past Medical History:  Diagnosis Date   Anemia    Arthritis    osteo   Blindness of right eye at birth   Cancer [facility]) 2011   Left breast 2011 and cervical age 40   Carpal tunnel syndrome    Cataract    Depression    Edema 07/27/2011   Fibromyalgia    muscle weakness and pain   GERD (gastroesophageal reflux disease)    Hyperlipidemia    Hypertension    benign   Lymphedema    Osteoporosis    Plantar fasciitis    Pre-diabetes    Raynaud's disease    Restless leg syndrome    Rosacea    Rotator cuff rupture    Sleep apnea    Synovitis    Ulcer    Past Surgical History:  Procedure Laterality Date   ABDOMINAL HYSTERECTOMY  1986   For bleeding and pain   ATRIAL FIBRILLATION ABLATION N/A 05/14/2023   Procedure: ATRIAL FIBRILLATION ABLATION;  Surgeon: Cindie Ole DASEN, MD;  Location: [facility];  Service: Cardiovascular;  Laterality: N/A;   BLADDER SURGERY  2006   Bladder tack   BREAST RECONSTRUCTION Bilateral 09/17/2012   Procedure: BREAST RECONSTRUCTION;  Surgeon: Estefana Reichert, DO;  Location: [facility];  Service: Plastics;  Laterality: Bilateral;  BILATERAL BREAST RECONSTRUCTION WITH TISSUE EXPANDERS AND ALLOMED   BREAST RECONSTRUCTION Right 06/03/2013   Procedure: RIGHT BREAST CAPSULE CONSTRACTURE;  Surgeon: Estefana Reichert, DO;  Location: [facility];  Service: Plastics;  Laterality: Right;   CAPSULECTOMY Right 11/18/2023   Procedure:  EXCISION OF RIGHT BREAST CAPSULE CONTRACTURE AND BREAST TISSUE;  Surgeon: Lowery Estefana RAMAN, DO;  Location: [facility];  Service: Plastics;  Laterality: Right;   CARDIOVERSION N/A 08/06/2022   Procedure: CARDIOVERSION;  Surgeon: Darron Deatrice LABOR, MD;  Location: [facility];  Service: Cardiovascular;  Laterality: N/A;   CARDIOVERSION N/A 10/02/2022   Procedure: CARDIOVERSION;  Surgeon: Mady Bruckner, MD;  Location: [facility];  Service: Cardiovascular;  Laterality: N/A;   CARPAL TUNNEL RELEASE Bilateral 2008   EYE SURGERY Right    INJECTION KNEE Left 08/12/2018   Procedure: KNEE INJECTION-LEFT;  Surgeon: Edie Norleen PARAS, MD;  Location: [facility];  Service: Orthopedics;  Laterality: Left;   JOINT REPLACEMENT     LIPOSUCTION Bilateral 01/29/2013   Procedure: LIPOSUCTION;  Surgeon: Estefana Reichert, DO;  Location: [facility];  Service: Plastics;  Laterality: Bilateral;   LIPOSUCTION Right 06/03/2013   Procedure: LIPOSUCTION;  Surgeon: Estefana Reichert, DO;  Location: [facility];  Service: Plastics;  Laterality: Right;   LIPOSUCTION Right 11/18/2023   Procedure: LIPOSUCTION LATERAL RIGHT CHEST;  Surgeon: Lowery Estefana RAMAN, DO;  Location: [facility];  Service: Plastics;  Laterality: Right;   MASTECTOMY  2012   rt prophalactic mast-snbx   MASTECTOMY MODIFIED RADICAL  2012  left-axillary nodes   NEUROMA SURGERY Bilateral    NOSE SURGERY  2007   PORT-A-CATH REMOVAL     insertion and   REVERSE SHOULDER ARTHROPLASTY Left 01/16/2024   Procedure: ARTHROPLASTY, SHOULDER, TOTAL, REVERSE;  Surgeon: Melita Drivers, MD;  Location: [facility];  Service: Orthopedics;  Laterality: Left;   REVISION, RECONSTRUCTION, BREAST Right 11/18/2023   Procedure: REVISION RIGHT BREAST RECONSTRUCTION;  Surgeon: Lowery Estefana RAMAN, DO;  Location: [facility];  Service: Plastics;  Laterality: Right;  Excision of scar, capsule contracture right breast , possible  liposuction   THROAT SURGERY     TONSILLECTOMY     TOTAL KNEE ARTHROPLASTY Right 08/12/2018   Procedure: TOTAL KNEE ARTHROPLASTY-RIGHT;  Surgeon: Edie Norleen PARAS, MD;  Location: [facility];  Service: Orthopedics;  Laterality: Right;   TOTAL KNEE ARTHROPLASTY Left 09/22/2019   Procedure: TOTAL KNEE ARTHROPLASTY;  Surgeon: Edie Norleen PARAS, MD;  Location: [facility];  Service: Orthopedics;  Laterality: Left;   UVULOPALATOPHARYNGOPLASTY     Patient Active Problem List   Diagnosis Date Noted   Raynaud's disease 11/05/2022   Osteoporosis, post-menopausal 11/05/2022   Atrial flutter (HCC) 10/02/2022   First degree AV block 09/11/2022   Arthritis 08/08/2022   Persistent atrial fibrillation (HCC) 08/06/2022   Depression 02/27/2022   Gastroesophageal reflux disease 02/27/2022   Hypertension 02/27/2022   Abnormal LFTs 02/27/2022   Alcohol  abuse 02/27/2022   Atrial fibrillation with RVR (HCC) 02/27/2022   Morbid obesity (HCC) 02/27/2022   Pes anserinus bursitis of left knee 02/08/2020   Status post total knee replacement using cement, left 09/22/2019   Status post total knee replacement using cement, right 08/12/2018   Rotator cuff rupture    Status post bilateral breast reconstruction 02/06/2013   Acquired absence of bilateral breasts and nipples 09/17/2012   Breast cancer of lower-outer quadrant of left female breast (HCC) 01/16/2011    PCP: Valora Agent, MD   REFERRING PROVIDER:  Andris Estefana BRAVO, PA-C   REFERRING DIAG:  M75.101 (ICD-10-CM) - Unspecified rotator cuff tear or rupture of right shoulder, not specified as traumatic    RATIONALE FOR EVALUATION AND TREATMENT: Rehabilitation  THERAPY DIAG: Chronic pain of both shoulders  Muscle weakness (generalized)  Stiffness of left shoulder, not elsewhere classified  ONSET DATE: 11/18/2023  FOLLOW-UP APPT SCHEDULED WITH REFERRING PROVIDER: Yes    SUBJECTIVE:                                                                                                                                                                                          SUBJECTIVE STATEMENT:    Patient with chief concern of bilateral  shoulder pain and limited ROM.   PERTINENT HISTORY:   REEVAL (01/22/2024):  Patient s/p 01/16/2024 Reverse Total Shoulder in the L arm. Patient reports 3/10 NPS in the Left Shoulder. Things have going well since the surgery. She has been doing dishes, lifting small objects, and small dog. She is adherent to her HEP exercises and utilizes ice for pain modulation. She is still active and doing activities as tolerated. Difficulty with reaching and picking up items with her RUE due to grip weakness and shoulder weakness.   Presents to OPPT with LLE in a shoulder sling.    INITIAL EVAL Patient is a 73 year old female with history of breast cancer. She most recently underwent excision of right breast capsule contracture and right breast tissue, advancement of inferior right chest and liposuction of the lateral right chest with Dr. Lowery on 11/18/2023. She she denies any fevers or chills.  No stitches or soreness along the R breast tissue/shoulder. She denies any drainage. She also reports that she has been having some difficulties with range of motion of her right upper extremity.   Pt reports that she has difficulties with lifting a gallon of milk to a top shelf with her RUE; she has to use her L arm to assist her R arm. She reports pain in both arms but R > L. Her L arm is starting to develop pain due to compensating for the R arm. Denies n/t.   She has lifting restrictions, no greater than 10 pounds for 6 weeks postoperatively. (12/30/2023)   Dominant hand: right   PAIN:  Pain Intensity: Present: 1/10, Best: 3/10, Worst: 10/10 Pain location: Bilateral Shoulder  Pain Quality: constant, sharp, and aching  Radiating: Yes  Numbness/Tingling: No Focal Weakness: Yes Aggravating factors: Repetitive Overhead Movements,  Laying on R shoulder,  Relieving factors: Ice Modalities 24-hour pain behavior: Activity Dependent History of prior shoulder or neck/shoulder injury, pain, surgery, or therapy: Yes Imaging: Yes  Red flags (personal history of cancer, chills/fever, night sweats, nausea, vomiting, unrelenting pain, unexplained weight gain/loss): Negative  PRECAUTIONS: None  WEIGHT BEARING RESTRICTIONS: Yes She has lifting restrictions, no greater than 10 pounds for 6 weeks postoperatively.  FALLS: Has patient fallen in last 6 months? No  Living Environment Lives with: lives with their family Lives in: House/apartment Stairs: Yes: Internal: 13 steps; on right going up and External: 5 steps; can reach both Has following equipment at home: None  Prior level of function: Independent  Occupational demands: Retired   Hobbies: Dog (91 yr old)  Patient Goals: Movement and less pain    OBJECTIVE:   Patient Surveys  FOTO: QuickDASH:  Cognition Patient is oriented to person, place, and time.  Recent memory is intact.  Remote memory is intact.  Attention span and concentration are intact.  Expressive speech is intact.  Patient's fund of knowledge is within normal limits for educational level.    Gross Musculoskeletal Assessment Tremor: None Bulk: Normal Tone: Normal   Posture Seated: Forward, Rounded Shoulder, increase thoracic kyphosis  Cervical Screen AROM: WFL and painless with overpressure in all planes Spurlings A (ipsilateral lateral flexion/axial compression): R: Negative L: Not examined Spurlings B (ipsilateral lateral flexion/contralateral rotation/axial compression): R: Not examined L: Not examined Repeated movement:Deferred a   AROM AROM (Normal range in degrees) AROM/PROM  Cervical  Flexion (50) WNL  Extension (80) WNL  Right lateral flexion (45) WNL  Left lateral flexion (45) WNL  Right rotation (85) WNL  Left rotation (85) WNL   Right  Left (01/22/24)  Shoulder     Flexion 180 Deferred/150  Extension    Abduction 90/180 Deferred/90  External Rotation 90   Internal Rotation 70/85 Deferred/50  Hands Behind Head WNL WNL  Hands Behind Back WNL Surgical precautions      Elbow    Flexion WNL WNL  Extension WNL WNL  Pronation WNL WNL  Supination WNL WNl  (* = pain; Blank rows = not tested)  UE MMT: MMT (out of 5) Right Left   Cervical (isometric)  Flexion WNL  Extension WNL  Lateral Flexion WNL WNL  Rotation WNL WNL      Shoulder   Flexion 3+ 3+  Extension    Abduction 3+ 4  External rotation 4- 4  Internal rotation 4- 4  Horizontal abduction    Horizontal adduction    Lower Trapezius    Rhomboids        Elbow  Flexion 5 5  Extension 5 5  Pronation 5 5  Supination 5 5      Wrist  Flexion    Extension    Radial deviation    Ulnar deviation        MCP  Flexion    Extension    Abduction    Adduction    (* = pain; Blank rows = not tested)  Sensation Grossly intact to light touch bilateral UE as determined by testing dermatomes C2-T2. Proprioception and hot/cold testing deferred on this date.  Reflexes Deferred   Palpation Location LEFT  RIGHT           Subocciptials    Cervical paraspinals 1 1  Upper Trapezius 1 1  Levator Scapulae    Rhomboid Major/Minor    Sternoclavicular joint    Acromioclavicular joint    Coracoid process    Long head of biceps    Supraspinatus 1 1  Infraspinatus 2 2  Subscapularis 1 1  Teres Minor    Teres Major    Pectoralis Major 1 1  Pectoralis Minor 1 1  Anterior Deltoid    Lateral Deltoid 1 1  Posterior Deltoid 1 1  Latissimus Dorsi    Sternocleidomastoid    (Blank rows = not tested) Graded on 0-4 scale (0 = no pain, 1 = pain, 2 = pain with wincing/grimacing/flinching, 3 = pain with withdrawal, 4 = unwilling to allow palpation), (Blank rows = not tested)   Passive Accessory Intervertebral Motion Deferred  Accessory Motions/Glides Glenohumeral: Posterior: R:  normal L: abnormal Inferior: R: normal L: abnormal Anterior: R: normal L: not examined   SPECIAL TESTS Rotator Cuff  Drop Arm Test: Negative Painful Arc (Pain from 60 to 120 degrees scaption): Positive Infraspinatus Muscle Test: Positive If all 3 tests positive, the probability of a full-thickness rotator cuff tear is 91%   Bicep Tendon Pathology Speed (shoulder flexion to 90, external rotation, full elbow extension, and forearm supination with resistance: Negative Yergason's (resisted shoulder ER and supination/biceps tendon pathology): Negative  TODAY'S TREATMENT: DATE: 02/06/2024  Subjective: Patient reports a 3/10 in the L Shoulder. Patient reports that she still performing house hold ADLs with both arms. Her L shoulder felt very sore following cleaning the house and wiping cabinets. Pt will follow up with PCP regarding lower back pain.  No further questions or concerns.   Therapeutic Exercise:   L PROM Exercises (Supine):   L GHJ    Abd: 2 x 10 Self PROM with yard stick   Flexion: 2  x 20 Self PROM with  RUE    Seated  R/L Elbow Flexion:    BUE: 3 x 15 reps, Green TB     Standing   R/L Tricep Extension    2 x 12, Green TB   1 x 12, Blue TB    Supine R Shoulder ER/IR against PT Resistance (Distal wrist) - 90 Abduction   2 x 10 x 5s isometric ea dir  Supine R Shoulder Rhythmic Stabilization at 90 shoulder flex   3 x 30s, PT applied perturbation at distal wrist   Seated ER Against Yellow TB - PT support at elbow to reduce compensation  2 x 12  L sidelying - R GHJ External Rotation   1 x 10 AROM   Therapeutic Activity (for overhead reaching tasks):  R Shoulder Flexion with Resistance  2 x 10, 1# DB   R Shoulder Abduction  2 x 10 x 2#DB, Eccectric focus from 90  Seated R Shoulder OH press with resistance   2 x 10, 2# DB    PATIENT EDUCATION:  Education details: POC, HEP, Prognosis Person educated: Patient Education method: Explanation, Demonstration,  and Handouts Education comprehension: verbalized understanding and returned demonstration   HOME EXERCISE PROGRAM:  Access Code: [email or web address] URL: [email or web address] Date: 01/01/2024 Prepared by: Lonni Jahniah Pallas  Exercises - Seated Shoulder Flexion Towel Slide at Table Top  - 1 x daily - 7 x weekly - 3 sets - 10 reps - Seated Shoulder Abduction AAROM with Pulley at Side  - 1 x daily - 7 x weekly - 3 sets - 20 reps - Standing Shoulder Flexion AAROM with Arms Bent and Pulley Behind  - 1 x daily - 7 x weekly - 3 sets - 20 reps - Seated Single Arm Shoulder External Rotation with Towel  - 1 x daily - 3-4 x weekly - 2-3 sets - 10 reps - Supine Shoulder External and Internal Rotation in Abduction with Dumbbell  - 1 x daily - 3-4 x weekly - 2-3 sets - 10 reps - Standing Isometric Shoulder External Rotation with Doorway and Towel Roll  - 1 x daily - 3-4 x weekly - 3 sets - 10s hold - Standing Single Arm Shoulder Abduction with Dumbbell - Palm Down  - 1 x daily - 3-4 x weekly - 2-3 sets - 10 reps   Access Code: [email or web address] URL: [email or web address] Date: 12/26/2023 Prepared by: Lonni Pall  Exercises - Seated Shoulder Abduction AAROM with Pulley at Side  - 1 x daily - 7 x weekly - 3 sets - 20 reps - Standing Shoulder Flexion AAROM with Arms Bent and Pulley Behind  - 1 x daily - 7 x weekly - 3 sets - 20 reps - Seated Single Arm Shoulder External Rotation with Towel  - 1 x daily - 3-4 x weekly - 2-3 sets - 10 reps - Supine Shoulder External and Internal Rotation in Abduction with Dumbbell  - 1 x daily - 3-4 x weekly - 2-3 sets - 10 reps  ASSESSMENT:  CLINICAL IMPRESSION: Continued PT POC with a main focus on treatment of R Rotator Cuff Tendinitis and L Reverse Total Shoulder Replacement. Patient tolerated all activities today without exacerbation pain in bilateral shoulders. She continues to tolerate increase ranges of AAROM in the L shoulder. She endorses continued  participation with household activities and use of LUE. PT advised to remain within surgical protocol and to refrain from excessive AROM of L shoulder in order to reduce risk of further injury. Continued Strengthening of rotator cuff  musculature in the RUE. Notable compensation with upper trapezius and elbow extension when performing external rotation exercises. PT regressed rotator cuff musculature to isometric exercise in order to reduce compensation. She continues to remain motivated, functionally independent with daily activities. PT plans to continue gradual progression of left shoulder mobility and strength while addressing compensatory patterns and endurance deficits in the right upper extremity. Based on today's performance, pt will continue to benefit from skilled PT in order to facilitate return to PLOF and improve QoL.   OBJECTIVE IMPAIRMENTS: decreased endurance, decreased mobility, decreased ROM, decreased strength, hypomobility, increased muscle spasms, and pain.   ACTIVITY LIMITATIONS: carrying, lifting, sleeping, reach over head, and hygiene/grooming  PARTICIPATION LIMITATIONS: meal prep, cleaning, and laundry  PERSONAL FACTORS: Age, Behavior pattern, Past/current experiences, Time since onset of injury/illness/exacerbation, and 1-2 comorbidities: s/p Capsulectomy, HTN are also affecting patient's functional outcome.   REHAB POTENTIAL: Fair Patient with significant weakness in R shoulder in abduction and flexion with visible shoulder shrug sign. L GHJ developing rotator cuff pathologies due to compensating for R sided weakness.   CLINICAL DECISION MAKING: Evolving/moderate complexity  EVALUATION COMPLEXITY: Moderate   GOALS: Goals reviewed with patient? Yes  SHORT TERM GOALS: Target date: 03/05/2024   Pt will be independent with HEP to improve strength and decrease shoulder pain to improve pain-free function at home and work. Baseline: Initial HEP provided Goal status:  INITIAL   LONG TERM GOALS: Target date: 04/02/2024   Pt will increase R Shoulder abd to 110 without scapular elevation in order to demonstrate significant improvements in shoulder functional and overheads reaching.  Baseline: 12/27/2023: ~90 Goal status: INITIAL  2.  Pt will decrease worst R shoulder pain by at least 3 points on the NPRS in order to demonstrate clinically significant reduction in shoulder pain. Baseline: 12/27/2023: 10/10 NPS Goal status: INITIAL  3.  Pt will decrease quick DASH score by at least 8% in order to demonstrate clinically significant reduction in disability related to shoulder pain        Baseline: Deferred to next visit Goal status: INITIAL  4. Pt will increase R shoulder flex/abd, IR/ER strength by at least 1/2 MMT grade in order to demonstrate improvement in strength and function Baseline:  Flexion 3+ 3+  Extension    Abduction 3+ 4  External rotation 4- 4  Internal rotation 4- 4   Goal status: INITIAL  5.  Pt will increase L shoulder flex/abd AROM to 110 in order to demonstrate improvements in ROM and functional mobility.         Baseline: 01/21/2024: 0 due to surgical protocol.  Goal status: INITIAL    PLAN: PT FREQUENCY: 1-2x/week  PT DURATION: 8 weeks  PLANNED INTERVENTIONS: Therapeutic exercises, Therapeutic activity, Neuromuscular re-education, Balance training, Gait training, Patient/Family education, Self Care, Joint mobilization, Joint manipulation, Vestibular training, Canalith repositioning, Orthotic/Fit training, DME instructions, Dry Needling, Electrical stimulation, Spinal manipulation, Spinal mobilization, Cryotherapy, Moist heat, Taping, Traction, Ultrasound, Ionotophoresis 4mg /ml Dexamethasone , Manual therapy, and Re-evaluation.  PLAN FOR NEXT SESSION: Review HEP, Initiate Shoulder strengthening, rotator cuff strength, parascapular strength   Lonni Pall PT, DPT Physical Therapist- [place]  02/06/2024, 4:05 PM

## 2024-02-11 ENCOUNTER — Ambulatory Visit

## 2024-02-11 DIAGNOSIS — M25511 Pain in right shoulder: Secondary | ICD-10-CM | POA: Diagnosis not present

## 2024-02-11 DIAGNOSIS — G8929 Other chronic pain: Secondary | ICD-10-CM | POA: Diagnosis not present

## 2024-02-11 DIAGNOSIS — M6281 Muscle weakness (generalized): Secondary | ICD-10-CM

## 2024-02-11 DIAGNOSIS — M25512 Pain in left shoulder: Secondary | ICD-10-CM | POA: Diagnosis not present

## 2024-02-11 DIAGNOSIS — M25612 Stiffness of left shoulder, not elsewhere classified: Secondary | ICD-10-CM | POA: Diagnosis not present

## 2024-02-11 NOTE — Therapy (Signed)
 OUTPATIENT PHYSICAL THERAPY SHOULDER/ELBOW TREATMENT  Patient Name: Krystal [name] MRN: [id number] DOB:12/11/1950, 73 y.o., female Today's Date: 02/11/2024  END OF SESSION:  PT End of Session - 02/11/24 1516     Visit Number 6    Number of Visits 17    Date for PT Re-Evaluation 02/21/24    PT Start Time 1515    PT Stop Time 1600    PT Time Calculation (min) 45 min    Activity Tolerance Patient tolerated treatment well    Behavior During Therapy [facility] for tasks assessed/performed          Past Medical History:  Diagnosis Date   Anemia    Arthritis    osteo   Blindness of right eye at birth   Cancer [facility]) 2011   Left breast 2011 and cervical age 23   Carpal tunnel syndrome    Cataract    Depression    Edema 07/27/2011   Fibromyalgia    muscle weakness and pain   GERD (gastroesophageal reflux disease)    Hyperlipidemia    Hypertension    benign   Lymphedema    Osteoporosis    Plantar fasciitis    Pre-diabetes    Raynaud's disease    Restless leg syndrome    Rosacea    Rotator cuff rupture    Sleep apnea    Synovitis    Ulcer    Past Surgical History:  Procedure Laterality Date   ABDOMINAL HYSTERECTOMY  1986   For bleeding and pain   ATRIAL FIBRILLATION ABLATION N/A 05/14/2023   Procedure: ATRIAL FIBRILLATION ABLATION;  Surgeon: Cindie Ole DASEN, MD;  Location: [facility];  Service: Cardiovascular;  Laterality: N/A;   BLADDER SURGERY  2006   Bladder tack   BREAST RECONSTRUCTION Bilateral 09/17/2012   Procedure: BREAST RECONSTRUCTION;  Surgeon: Estefana Reichert, DO;  Location: [facility];  Service: Plastics;  Laterality: Bilateral;  BILATERAL BREAST RECONSTRUCTION WITH TISSUE EXPANDERS AND ALLOMED   BREAST RECONSTRUCTION Right 06/03/2013   Procedure: RIGHT BREAST CAPSULE CONSTRACTURE;  Surgeon: Estefana Reichert, DO;  Location: [facility];  Service: Plastics;  Laterality: Right;   CAPSULECTOMY Right 11/18/2023   Procedure:  EXCISION OF RIGHT BREAST CAPSULE CONTRACTURE AND BREAST TISSUE;  Surgeon: Lowery Estefana RAMAN, DO;  Location: [facility];  Service: Plastics;  Laterality: Right;   CARDIOVERSION N/A 08/06/2022   Procedure: CARDIOVERSION;  Surgeon: Darron Deatrice LABOR, MD;  Location: [facility];  Service: Cardiovascular;  Laterality: N/A;   CARDIOVERSION N/A 10/02/2022   Procedure: CARDIOVERSION;  Surgeon: Mady Bruckner, MD;  Location: [facility];  Service: Cardiovascular;  Laterality: N/A;   CARPAL TUNNEL RELEASE Bilateral 2008   EYE SURGERY Right    INJECTION KNEE Left 08/12/2018   Procedure: KNEE INJECTION-LEFT;  Surgeon: Edie Norleen PARAS, MD;  Location: [facility];  Service: Orthopedics;  Laterality: Left;   JOINT REPLACEMENT     LIPOSUCTION Bilateral 01/29/2013   Procedure: LIPOSUCTION;  Surgeon: Estefana Reichert, DO;  Location: [facility];  Service: Plastics;  Laterality: Bilateral;   LIPOSUCTION Right 06/03/2013   Procedure: LIPOSUCTION;  Surgeon: Estefana Reichert, DO;  Location: [facility];  Service: Plastics;  Laterality: Right;   LIPOSUCTION Right 11/18/2023   Procedure: LIPOSUCTION LATERAL RIGHT CHEST;  Surgeon: Lowery Estefana RAMAN, DO;  Location:  SURGERY CENTER;  Service: Plastics;  Laterality: Right;   MASTECTOMY  2012   rt prophalactic mast-snbx   MASTECTOMY MODIFIED RADICAL  2012  left-axillary nodes   NEUROMA SURGERY Bilateral    NOSE SURGERY  2007   PORT-A-CATH REMOVAL     insertion and   REVERSE SHOULDER ARTHROPLASTY Left 01/16/2024   Procedure: ARTHROPLASTY, SHOULDER, TOTAL, REVERSE;  Surgeon: Melita Drivers, MD;  Location: [facility];  Service: Orthopedics;  Laterality: Left;   REVISION, RECONSTRUCTION, BREAST Right 11/18/2023   Procedure: REVISION RIGHT BREAST RECONSTRUCTION;  Surgeon: Lowery Estefana RAMAN, DO;  Location: [facility];  Service: Plastics;  Laterality: Right;  Excision of scar, capsule contracture right breast , possible  liposuction   THROAT SURGERY     TONSILLECTOMY     TOTAL KNEE ARTHROPLASTY Right 08/12/2018   Procedure: TOTAL KNEE ARTHROPLASTY-RIGHT;  Surgeon: Edie Norleen PARAS, MD;  Location: [facility];  Service: Orthopedics;  Laterality: Right;   TOTAL KNEE ARTHROPLASTY Left 09/22/2019   Procedure: TOTAL KNEE ARTHROPLASTY;  Surgeon: Edie Norleen PARAS, MD;  Location: [facility];  Service: Orthopedics;  Laterality: Left;   UVULOPALATOPHARYNGOPLASTY     Patient Active Problem List   Diagnosis Date Noted   Raynaud's disease 11/05/2022   Osteoporosis, post-menopausal 11/05/2022   Atrial flutter (HCC) 10/02/2022   First degree AV block 09/11/2022   Arthritis 08/08/2022   Persistent atrial fibrillation (HCC) 08/06/2022   Depression 02/27/2022   Gastroesophageal reflux disease 02/27/2022   Hypertension 02/27/2022   Abnormal LFTs 02/27/2022   Alcohol  abuse 02/27/2022   Atrial fibrillation with RVR (HCC) 02/27/2022   Morbid obesity (HCC) 02/27/2022   Pes anserinus bursitis of left knee 02/08/2020   Status post total knee replacement using cement, left 09/22/2019   Status post total knee replacement using cement, right 08/12/2018   Rotator cuff rupture    Status post bilateral breast reconstruction 02/06/2013   Acquired absence of bilateral breasts and nipples 09/17/2012   Breast cancer of lower-outer quadrant of left female breast (HCC) 01/16/2011    PCP: Valora Agent, MD   REFERRING PROVIDER:  Andris Estefana BRAVO, PA-C   REFERRING DIAG:  M75.101 (ICD-10-CM) - Unspecified rotator cuff tear or rupture of right shoulder, not specified as traumatic    RATIONALE FOR EVALUATION AND TREATMENT: Rehabilitation  THERAPY DIAG: Chronic pain of both shoulders  Muscle weakness (generalized)  Stiffness of left shoulder, not elsewhere classified  ONSET DATE: 11/18/2023  FOLLOW-UP APPT SCHEDULED WITH REFERRING PROVIDER: Yes    SUBJECTIVE:                                                                                                                                                                                          SUBJECTIVE STATEMENT:    Patient with chief concern of bilateral  shoulder pain and limited ROM.   PERTINENT HISTORY:   REEVAL (01/22/2024):  Patient s/p 01/16/2024 Reverse Total Shoulder in the L arm. Patient reports 3/10 NPS in the Left Shoulder. Things have going well since the surgery. She has been doing dishes, lifting small objects, and small dog. She is adherent to her HEP exercises and utilizes ice for pain modulation. She is still active and doing activities as tolerated. Difficulty with reaching and picking up items with her RUE due to grip weakness and shoulder weakness.   Presents to OPPT with LLE in a shoulder sling.    INITIAL EVAL Patient is a 73 year old female with history of breast cancer. She most recently underwent excision of right breast capsule contracture and right breast tissue, advancement of inferior right chest and liposuction of the lateral right chest with Dr. Lowery on 11/18/2023. She she denies any fevers or chills.  No stitches or soreness along the R breast tissue/shoulder. She denies any drainage. She also reports that she has been having some difficulties with range of motion of her right upper extremity.   Pt reports that she has difficulties with lifting a gallon of milk to a top shelf with her RUE; she has to use her L arm to assist her R arm. She reports pain in both arms but R > L. Her L arm is starting to develop pain due to compensating for the R arm. Denies n/t.   She has lifting restrictions, no greater than 10 pounds for 6 weeks postoperatively. (12/30/2023)   Dominant hand: right   PAIN:  Pain Intensity: Present: 1/10, Best: 3/10, Worst: 10/10 Pain location: Bilateral Shoulder  Pain Quality: constant, sharp, and aching  Radiating: Yes  Numbness/Tingling: No Focal Weakness: Yes Aggravating factors: Repetitive Overhead Movements,  Laying on R shoulder,  Relieving factors: Ice Modalities 24-hour pain behavior: Activity Dependent History of prior shoulder or neck/shoulder injury, pain, surgery, or therapy: Yes Imaging: Yes  Red flags (personal history of cancer, chills/fever, night sweats, nausea, vomiting, unrelenting pain, unexplained weight gain/loss): Negative  PRECAUTIONS: None  WEIGHT BEARING RESTRICTIONS: Yes She has lifting restrictions, no greater than 10 pounds for 6 weeks postoperatively.  FALLS: Has patient fallen in last 6 months? No  Living Environment Lives with: lives with their family Lives in: House/apartment Stairs: Yes: Internal: 13 steps; on right going up and External: 5 steps; can reach both Has following equipment at home: None  Prior level of function: Independent  Occupational demands: Retired   Hobbies: Dog (43 yr old)  Patient Goals: Movement and less pain    OBJECTIVE:   Patient Surveys  FOTO: QuickDASH:  Cognition Patient is oriented to person, place, and time.  Recent memory is intact.  Remote memory is intact.  Attention span and concentration are intact.  Expressive speech is intact.  Patient's fund of knowledge is within normal limits for educational level.    Gross Musculoskeletal Assessment Tremor: None Bulk: Normal Tone: Normal   Posture Seated: Forward, Rounded Shoulder, increase thoracic kyphosis  Cervical Screen AROM: WFL and painless with overpressure in all planes Spurlings A (ipsilateral lateral flexion/axial compression): R: Negative L: Not examined Spurlings B (ipsilateral lateral flexion/contralateral rotation/axial compression): R: Not examined L: Not examined Repeated movement:Deferred a   AROM AROM (Normal range in degrees) AROM/PROM  Cervical  Flexion (50) WNL  Extension (80) WNL  Right lateral flexion (45) WNL  Left lateral flexion (45) WNL  Right rotation (85) WNL  Left rotation (85) WNL   Right  Left (01/22/24)  Shoulder     Flexion 180 Deferred/150  Extension    Abduction 90/180 Deferred/90  External Rotation 90   Internal Rotation 70/85 Deferred/50  Hands Behind Head WNL WNL  Hands Behind Back WNL Surgical precautions      Elbow    Flexion WNL WNL  Extension WNL WNL  Pronation WNL WNL  Supination WNL WNl  (* = pain; Blank rows = not tested)  UE MMT: MMT (out of 5) Right Left   Cervical (isometric)  Flexion WNL  Extension WNL  Lateral Flexion WNL WNL  Rotation WNL WNL      Shoulder   Flexion 3+ 3+  Extension    Abduction 3+ 4  External rotation 4- 4  Internal rotation 4- 4  Horizontal abduction    Horizontal adduction    Lower Trapezius    Rhomboids        Elbow  Flexion 5 5  Extension 5 5  Pronation 5 5  Supination 5 5      Wrist  Flexion    Extension    Radial deviation    Ulnar deviation        MCP  Flexion    Extension    Abduction    Adduction    (* = pain; Blank rows = not tested)  Sensation Grossly intact to light touch bilateral UE as determined by testing dermatomes C2-T2. Proprioception and hot/cold testing deferred on this date.  Reflexes Deferred   Palpation Location LEFT  RIGHT           Subocciptials    Cervical paraspinals 1 1  Upper Trapezius 1 1  Levator Scapulae    Rhomboid Major/Minor    Sternoclavicular joint    Acromioclavicular joint    Coracoid process    Long head of biceps    Supraspinatus 1 1  Infraspinatus 2 2  Subscapularis 1 1  Teres Minor    Teres Major    Pectoralis Major 1 1  Pectoralis Minor 1 1  Anterior Deltoid    Lateral Deltoid 1 1  Posterior Deltoid 1 1  Latissimus Dorsi    Sternocleidomastoid    (Blank rows = not tested) Graded on 0-4 scale (0 = no pain, 1 = pain, 2 = pain with wincing/grimacing/flinching, 3 = pain with withdrawal, 4 = unwilling to allow palpation), (Blank rows = not tested)   Passive Accessory Intervertebral Motion Deferred  Accessory Motions/Glides Glenohumeral: Posterior: R:  normal L: abnormal Inferior: R: normal L: abnormal Anterior: R: normal L: not examined   SPECIAL TESTS Rotator Cuff  Drop Arm Test: Negative Painful Arc (Pain from 60 to 120 degrees scaption): Positive Infraspinatus Muscle Test: Positive If all 3 tests positive, the probability of a full-thickness rotator cuff tear is 91%   Bicep Tendon Pathology Speed (shoulder flexion to 90, external rotation, full elbow extension, and forearm supination with resistance: Negative Yergason's (resisted shoulder ER and supination/biceps tendon pathology): Negative  TODAY'S TREATMENT: DATE: 02/11/2024  Subjective: Patient reports some soreness following lots of activities yesterday. 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  Patient with f/u with surgeon who performed L Reverse TSA for progress. No further questions or concerns.   Therapeutic Exercise:   Supine Shoulder Flexion within tolerance   1 x 10 PROM  2 x 10 AROM - 150   Supine Bilateral Shoulder abduction   2 x 10 AROM    Seated  R/L Elbow Flexion:    3  x 10, 4# DB     Standing Tricep Extension  R/L: 2 x 10, Blue TB  R/L: 1 x 12, Blue TB     GHJ ER against Door frame for rotator cuff activation    R/L: 4 x 10s     L sidelying - R GHJ External Rotation    2 x 10 AROM    2 x 10, 1# DB   Therapeutic Activity:  Standing Shoulder Flexion   1 x 10, 1# DB  2 x 10, 2# DB    Standing Shoulder Abduction    3 x 10, 2# DB    R Shoulder Elbow Flexion to OH Press    2 x 10, 2# DB      PATIENT EDUCATION:  Education details: POC, HEP, Prognosis Person educated: Patient Education method: Explanation, Demonstration, and Handouts Education comprehension: verbalized understanding and returned demonstration   HOME EXERCISE PROGRAM:  Access Code: [email or web address] URL: [email or web address] Date: 01/01/2024 Prepared by: Lonni Marka Treloar  Exercises - Seated Shoulder Flexion Towel Slide at Table Top  - 1  x daily - 7 x weekly - 3 sets - 10 reps - Seated Shoulder Abduction AAROM with Pulley at Side  - 1 x daily - 7 x weekly - 3 sets - 20 reps - Standing Shoulder Flexion AAROM with Arms Bent and Pulley Behind  - 1 x daily - 7 x weekly - 3 sets - 20 reps - Seated Single Arm Shoulder External Rotation with Towel  - 1 x daily - 3-4 x weekly - 2-3 sets - 10 reps - Supine Shoulder External and Internal Rotation in Abduction with Dumbbell  - 1 x daily - 3-4 x weekly - 2-3 sets - 10 reps - Standing Isometric Shoulder External Rotation with Doorway and Towel Roll  - 1 x daily - 3-4 x weekly - 3 sets - 10s hold - Standing Single Arm Shoulder Abduction with Dumbbell - Palm Down  - 1 x daily - 3-4 x weekly - 2-3 sets - 10 reps   Access Code: [email or web address] URL: [email or web address] Date: 12/26/2023 Prepared by: Lonni Pall  Exercises - Seated Shoulder Abduction AAROM with Pulley at Side  - 1 x daily - 7 x weekly - 3 sets - 20 reps - Standing Shoulder Flexion AAROM with Arms Bent and Pulley Behind  - 1 x daily - 7 x weekly - 3 sets - 20 reps - Seated Single Arm Shoulder External Rotation with Towel  - 1 x daily - 3-4 x weekly - 2-3 sets - 10 reps - Supine Shoulder External and Internal Rotation in Abduction with Dumbbell  - 1 x daily - 3-4 x weekly - 2-3 sets - 10 reps  ASSESSMENT:  CLINICAL IMPRESSION: Continued PT POC with a main focus on treatment of R Rotator Cuff Tendinitis and L Reverse Total Shoulder Replacement. Patient tolerated all interventions without pain in either shoulder. PT with strong encouragement for activity modification in order to reduce further injury to L shoulder. She still presenting with significant compensation in R shoulder with tasks requiring external rotation. Continued R isometric and AROM external rotation in order to compensation of parascapular muscles. She continues to remain motivated, functionally independent with daily activities. PT plans to continue  gradual progression of left shoulder mobility and strength while addressing compensatory patterns and endurance deficits in the right upper extremity. Based on today's performance, pt will continue to benefit from skilled PT in order to facilitate return to PLOF and improve QoL.  OBJECTIVE IMPAIRMENTS: decreased endurance, decreased mobility, decreased ROM, decreased strength, hypomobility, increased muscle spasms, and pain.   ACTIVITY LIMITATIONS: carrying, lifting, sleeping, reach over head, and hygiene/grooming  PARTICIPATION LIMITATIONS: meal prep, cleaning, and laundry  PERSONAL FACTORS: Age, Behavior pattern, Past/current experiences, Time since onset of injury/illness/exacerbation, and 1-2 comorbidities: s/p Capsulectomy, HTN are also affecting patient's functional outcome.   REHAB POTENTIAL: Fair Patient with significant weakness in R shoulder in abduction and flexion with visible shoulder shrug sign. L GHJ developing rotator cuff pathologies due to compensating for R sided weakness.   CLINICAL DECISION MAKING: Evolving/moderate complexity  EVALUATION COMPLEXITY: Moderate   GOALS: Goals reviewed with patient? Yes  SHORT TERM GOALS: Target date: 03/10/2024   Pt will be independent with HEP to improve strength and decrease shoulder pain to improve pain-free function at home and work. Baseline: Initial HEP provided Goal status: INITIAL   LONG TERM GOALS: Target date: 04/07/2024   Pt will increase R Shoulder abd to 110 without scapular elevation in order to demonstrate significant improvements in shoulder functional and overheads reaching.  Baseline: 12/27/2023: ~90 Goal status: INITIAL  2.  Pt will decrease worst R shoulder pain by at least 3 points on the NPRS in order to demonstrate clinically significant reduction in shoulder pain. Baseline: 12/27/2023: 10/10 NPS Goal status: INITIAL  3.  Pt will decrease quick DASH score by at least 8% in order to demonstrate  clinically significant reduction in disability related to shoulder pain        Baseline: Deferred to next visit Goal status: INITIAL  4. Pt will increase R shoulder flex/abd, IR/ER strength by at least 1/2 MMT grade in order to demonstrate improvement in strength and function Baseline:  Flexion 3+ 3+  Extension    Abduction 3+ 4  External rotation 4- 4  Internal rotation 4- 4   Goal status: INITIAL  5.  Pt will increase L shoulder flex/abd AROM to 110 in order to demonstrate improvements in ROM and functional mobility.         Baseline: 01/21/2024: 0 due to surgical protocol.  Goal status: INITIAL    PLAN: PT FREQUENCY: 1-2x/week  PT DURATION: 8 weeks  PLANNED INTERVENTIONS: Therapeutic exercises, Therapeutic activity, Neuromuscular re-education, Balance training, Gait training, Patient/Family education, Self Care, Joint mobilization, Joint manipulation, Vestibular training, Canalith repositioning, Orthotic/Fit training, DME instructions, Dry Needling, Electrical stimulation, Spinal manipulation, Spinal mobilization, Cryotherapy, Moist heat, Taping, Traction, Ultrasound, Ionotophoresis 4mg /ml Dexamethasone , Manual therapy, and Re-evaluation.  PLAN FOR NEXT SESSION: Review HEP, Initiate Shoulder strengthening, rotator cuff strength, parascapular strength   Lonni Pall PT, DPT Physical Therapist-   02/11/2024, 3:20 PM

## 2024-02-12 DIAGNOSIS — M25552 Pain in left hip: Secondary | ICD-10-CM | POA: Diagnosis not present

## 2024-02-12 DIAGNOSIS — M25551 Pain in right hip: Secondary | ICD-10-CM | POA: Diagnosis not present

## 2024-02-12 DIAGNOSIS — M069 Rheumatoid arthritis, unspecified: Secondary | ICD-10-CM | POA: Diagnosis not present

## 2024-02-12 DIAGNOSIS — M48062 Spinal stenosis, lumbar region with neurogenic claudication: Secondary | ICD-10-CM | POA: Diagnosis not present

## 2024-02-13 ENCOUNTER — Ambulatory Visit

## 2024-02-13 DIAGNOSIS — G8929 Other chronic pain: Secondary | ICD-10-CM

## 2024-02-13 DIAGNOSIS — M25512 Pain in left shoulder: Secondary | ICD-10-CM | POA: Diagnosis not present

## 2024-02-13 DIAGNOSIS — M25612 Stiffness of left shoulder, not elsewhere classified: Secondary | ICD-10-CM | POA: Diagnosis not present

## 2024-02-13 DIAGNOSIS — M6281 Muscle weakness (generalized): Secondary | ICD-10-CM | POA: Diagnosis not present

## 2024-02-13 DIAGNOSIS — M25511 Pain in right shoulder: Secondary | ICD-10-CM | POA: Diagnosis not present

## 2024-02-13 NOTE — Therapy (Signed)
 OUTPATIENT PHYSICAL THERAPY SHOULDER/ELBOW TREATMENT  Patient Name: Krystal [name] MRN: [id number] DOB:Jul 31, 1950, 73 y.o., female Today's Date: 02/13/2024  END OF SESSION:  PT End of Session - 02/13/24 1517     Visit Number 7    Number of Visits 17    Date for PT Re-Evaluation 02/21/24    PT Start Time 1516    PT Stop Time 1555    PT Time Calculation (min) 39 min    Activity Tolerance Patient tolerated treatment well    Behavior During Therapy [facility] for tasks assessed/performed          Past Medical History:  Diagnosis Date   Anemia    Arthritis    osteo   Blindness of right eye at birth   Cancer [facility]) 2011   Left breast 2011 and cervical age 23   Carpal tunnel syndrome    Cataract    Depression    Edema 07/27/2011   Fibromyalgia    muscle weakness and pain   GERD (gastroesophageal reflux disease)    Hyperlipidemia    Hypertension    benign   Lymphedema    Osteoporosis    Plantar fasciitis    Pre-diabetes    Raynaud's disease    Restless leg syndrome    Rosacea    Rotator cuff rupture    Sleep apnea    Synovitis    Ulcer    Past Surgical History:  Procedure Laterality Date   ABDOMINAL HYSTERECTOMY  1986   For bleeding and pain   ATRIAL FIBRILLATION ABLATION N/A 05/14/2023   Procedure: ATRIAL FIBRILLATION ABLATION;  Surgeon: Cindie Ole DASEN, MD;  Location: [facility];  Service: Cardiovascular;  Laterality: N/A;   BLADDER SURGERY  2006   Bladder tack   BREAST RECONSTRUCTION Bilateral 09/17/2012   Procedure: BREAST RECONSTRUCTION;  Surgeon: Estefana Reichert, DO;  Location: [facility];  Service: Plastics;  Laterality: Bilateral;  BILATERAL BREAST RECONSTRUCTION WITH TISSUE EXPANDERS AND ALLOMED   BREAST RECONSTRUCTION Right 06/03/2013   Procedure: RIGHT BREAST CAPSULE CONSTRACTURE;  Surgeon: Estefana Reichert, DO;  Location: [facility];  Service: Plastics;  Laterality: Right;   CAPSULECTOMY Right 11/18/2023   Procedure:  EXCISION OF RIGHT BREAST CAPSULE CONTRACTURE AND BREAST TISSUE;  Surgeon: Lowery Estefana RAMAN, DO;  Location: [facility];  Service: Plastics;  Laterality: Right;   CARDIOVERSION N/A 08/06/2022   Procedure: CARDIOVERSION;  Surgeon: Darron Deatrice LABOR, MD;  Location: [facility];  Service: Cardiovascular;  Laterality: N/A;   CARDIOVERSION N/A 10/02/2022   Procedure: CARDIOVERSION;  Surgeon: Mady Bruckner, MD;  Location: [facility];  Service: Cardiovascular;  Laterality: N/A;   CARPAL TUNNEL RELEASE Bilateral 2008   EYE SURGERY Right    INJECTION KNEE Left 08/12/2018   Procedure: KNEE INJECTION-LEFT;  Surgeon: Edie Norleen PARAS, MD;  Location: [facility];  Service: Orthopedics;  Laterality: Left;   JOINT REPLACEMENT     LIPOSUCTION Bilateral 01/29/2013   Procedure: LIPOSUCTION;  Surgeon: Estefana Reichert, DO;  Location: [facility];  Service: Plastics;  Laterality: Bilateral;   LIPOSUCTION Right 06/03/2013   Procedure: LIPOSUCTION;  Surgeon: Estefana Reichert, DO;  Location: [facility];  Service: Plastics;  Laterality: Right;   LIPOSUCTION Right 11/18/2023   Procedure: LIPOSUCTION LATERAL RIGHT CHEST;  Surgeon: Lowery Estefana RAMAN, DO;  Location: [facility];  Service: Plastics;  Laterality: Right;   MASTECTOMY  2012   rt prophalactic mast-snbx   MASTECTOMY MODIFIED RADICAL  2012  left-axillary nodes   NEUROMA SURGERY Bilateral    NOSE SURGERY  2007   PORT-A-CATH REMOVAL     insertion and   REVERSE SHOULDER ARTHROPLASTY Left 01/16/2024   Procedure: ARTHROPLASTY, SHOULDER, TOTAL, REVERSE;  Surgeon: Melita Drivers, MD;  Location: [facility];  Service: Orthopedics;  Laterality: Left;   REVISION, RECONSTRUCTION, BREAST Right 11/18/2023   Procedure: REVISION RIGHT BREAST RECONSTRUCTION;  Surgeon: Lowery Estefana RAMAN, DO;  Location: [facility];  Service: Plastics;  Laterality: Right;  Excision of scar, capsule contracture right breast , possible  liposuction   THROAT SURGERY     TONSILLECTOMY     TOTAL KNEE ARTHROPLASTY Right 08/12/2018   Procedure: TOTAL KNEE ARTHROPLASTY-RIGHT;  Surgeon: Edie Norleen PARAS, MD;  Location: [facility];  Service: Orthopedics;  Laterality: Right;   TOTAL KNEE ARTHROPLASTY Left 09/22/2019   Procedure: TOTAL KNEE ARTHROPLASTY;  Surgeon: Edie Norleen PARAS, MD;  Location: [facility];  Service: Orthopedics;  Laterality: Left;   UVULOPALATOPHARYNGOPLASTY     Patient Active Problem List   Diagnosis Date Noted   Raynaud's disease 11/05/2022   Osteoporosis, post-menopausal 11/05/2022   Atrial flutter (HCC) 10/02/2022   First degree AV block 09/11/2022   Arthritis 08/08/2022   Persistent atrial fibrillation (HCC) 08/06/2022   Depression 02/27/2022   Gastroesophageal reflux disease 02/27/2022   Hypertension 02/27/2022   Abnormal LFTs 02/27/2022   Alcohol  abuse 02/27/2022   Atrial fibrillation with RVR (HCC) 02/27/2022   Morbid obesity (HCC) 02/27/2022   Pes anserinus bursitis of left knee 02/08/2020   Status post total knee replacement using cement, left 09/22/2019   Status post total knee replacement using cement, right 08/12/2018   Rotator cuff rupture    Status post bilateral breast reconstruction 02/06/2013   Acquired absence of bilateral breasts and nipples 09/17/2012   Breast cancer of lower-outer quadrant of left female breast (HCC) 01/16/2011    PCP: Valora Agent, MD   REFERRING PROVIDER:  Andris Estefana BRAVO, PA-C   REFERRING DIAG:  M75.101 (ICD-10-CM) - Unspecified rotator cuff tear or rupture of right shoulder, not specified as traumatic    RATIONALE FOR EVALUATION AND TREATMENT: Rehabilitation  THERAPY DIAG: No diagnosis found.  ONSET DATE: 11/18/2023  FOLLOW-UP APPT SCHEDULED WITH REFERRING PROVIDER: Yes    SUBJECTIVE:                                                                                                                                                                                          SUBJECTIVE STATEMENT:    Patient with chief concern of bilateral shoulder pain and limited ROM.   PERTINENT HISTORY:   REEVAL (01/22/2024):  Patient s/p 01/16/2024 Reverse Total Shoulder in the L arm. Patient reports 3/10 NPS in the Left Shoulder. Things have going well since the surgery. She has been doing dishes, lifting small objects, and small dog. She is adherent to her HEP exercises and utilizes ice for pain modulation. She is still active and doing activities as tolerated. Difficulty with reaching and picking up items with her RUE due to grip weakness and shoulder weakness.   Presents to OPPT with LLE in a shoulder sling.    INITIAL EVAL Patient is a 73 year old female with history of breast cancer. She most recently underwent excision of right breast capsule contracture and right breast tissue, advancement of inferior right chest and liposuction of the lateral right chest with Dr. Lowery on 11/18/2023. She she denies any fevers or chills.  No stitches or soreness along the R breast tissue/shoulder. She denies any drainage. She also reports that she has been having some difficulties with range of motion of her right upper extremity.   Pt reports that she has difficulties with lifting a gallon of milk to a top shelf with her RUE; she has to use her L arm to assist her R arm. She reports pain in both arms but R > L. Her L arm is starting to develop pain due to compensating for the R arm. Denies n/t.   She has lifting restrictions, no greater than 10 pounds for 6 weeks postoperatively. (12/30/2023)   Dominant hand: right   PAIN:  Pain Intensity: Present: 1/10, Best: 3/10, Worst: 10/10 Pain location: Bilateral Shoulder  Pain Quality: constant, sharp, and aching  Radiating: Yes  Numbness/Tingling: No Focal Weakness: Yes Aggravating factors: Repetitive Overhead Movements, Laying on R shoulder,  Relieving factors: Ice Modalities 24-hour pain behavior: Activity  Dependent History of prior shoulder or neck/shoulder injury, pain, surgery, or therapy: Yes Imaging: Yes  Red flags (personal history of cancer, chills/fever, night sweats, nausea, vomiting, unrelenting pain, unexplained weight gain/loss): Negative  PRECAUTIONS: None  WEIGHT BEARING RESTRICTIONS: Yes She has lifting restrictions, no greater than 10 pounds for 6 weeks postoperatively.  FALLS: Has patient fallen in last 6 months? No  Living Environment Lives with: lives with their family Lives in: House/apartment Stairs: Yes: Internal: 13 steps; on right going up and External: 5 steps; can reach both Has following equipment at home: None  Prior level of function: Independent  Occupational demands: Retired   Hobbies: Dog (67 yr old)  Patient Goals: Movement and less pain    OBJECTIVE:   Patient Surveys  FOTO: QuickDASH:  Cognition Patient is oriented to person, place, and time.  Recent memory is intact.  Remote memory is intact.  Attention span and concentration are intact.  Expressive speech is intact.  Patient's fund of knowledge is within normal limits for educational level.    Gross Musculoskeletal Assessment Tremor: None Bulk: Normal Tone: Normal   Posture Seated: Forward, Rounded Shoulder, increase thoracic kyphosis  Cervical Screen AROM: WFL and painless with overpressure in all planes Spurlings A (ipsilateral lateral flexion/axial compression): R: Negative L: Not examined Spurlings B (ipsilateral lateral flexion/contralateral rotation/axial compression): R: Not examined L: Not examined Repeated movement:Deferred a   AROM AROM (Normal range in degrees) AROM/PROM  Cervical  Flexion (50) WNL  Extension (80) WNL  Right lateral flexion (45) WNL  Left lateral flexion (45) WNL  Right rotation (85) WNL  Left rotation (85) WNL   Right Left (01/22/24)  Shoulder    Flexion 180 Deferred/150  Extension  Abduction 90/180 Deferred/90  External  Rotation 90   Internal Rotation 70/85 Deferred/50  Hands Behind Head WNL WNL  Hands Behind Back WNL Surgical precautions      Elbow    Flexion WNL WNL  Extension WNL WNL  Pronation WNL WNL  Supination WNL WNl  (* = pain; Blank rows = not tested)  UE MMT: MMT (out of 5) Right Left   Cervical (isometric)  Flexion WNL  Extension WNL  Lateral Flexion WNL WNL  Rotation WNL WNL      Shoulder   Flexion 3+ 3+  Extension    Abduction 3+ 4  External rotation 4- 4  Internal rotation 4- 4  Horizontal abduction    Horizontal adduction    Lower Trapezius    Rhomboids        Elbow  Flexion 5 5  Extension 5 5  Pronation 5 5  Supination 5 5      Wrist  Flexion    Extension    Radial deviation    Ulnar deviation        MCP  Flexion    Extension    Abduction    Adduction    (* = pain; Blank rows = not tested)  Sensation Grossly intact to light touch bilateral UE as determined by testing dermatomes C2-T2. Proprioception and hot/cold testing deferred on this date.  Reflexes Deferred   Palpation Location LEFT  RIGHT           Subocciptials    Cervical paraspinals 1 1  Upper Trapezius 1 1  Levator Scapulae    Rhomboid Major/Minor    Sternoclavicular joint    Acromioclavicular joint    Coracoid process    Long head of biceps    Supraspinatus 1 1  Infraspinatus 2 2  Subscapularis 1 1  Teres Minor    Teres Major    Pectoralis Major 1 1  Pectoralis Minor 1 1  Anterior Deltoid    Lateral Deltoid 1 1  Posterior Deltoid 1 1  Latissimus Dorsi    Sternocleidomastoid    (Blank rows = not tested) Graded on 0-4 scale (0 = no pain, 1 = pain, 2 = pain with wincing/grimacing/flinching, 3 = pain with withdrawal, 4 = unwilling to allow palpation), (Blank rows = not tested)   Passive Accessory Intervertebral Motion Deferred  Accessory Motions/Glides Glenohumeral: Posterior: R: normal L: abnormal Inferior: R: normal L: abnormal Anterior: R: normal L: not  examined   SPECIAL TESTS Rotator Cuff  Drop Arm Test: Negative Painful Arc (Pain from 60 to 120 degrees scaption): Positive Infraspinatus Muscle Test: Positive If all 3 tests positive, the probability of a full-thickness rotator cuff tear is 91%   Bicep Tendon Pathology Speed (shoulder flexion to 90, external rotation, full elbow extension, and forearm supination with resistance: Negative Yergason's (resisted shoulder ER and supination/biceps tendon pathology): Negative  TODAY'S TREATMENT: DATE: 02/13/24  Subjective: Patient reports 2/10 NPS in both shoulders. She reports doing a lot exercises yesterday. She has started using 2# DB at home for HEP exercises. Prior to PT session she was performing some weeding, walking the dog and playing volleyball with other seniors. No further questions or concerns.   Therapeutic Exercise:    Seated Elbow Flexion - Supinated Grip    2 x 10, 5# DB  Standing Elbow Flexion - Neutral Grip   2 x 10, 5# DB   Standing Shoulder Abduction with resistance  2 x 10, 2# LUE/3# RUE   Standing  Tricep Extension  R/L: 3 x 10, Blue TB    Shoulder ER Isometric - Walk Out against TB - Elbow supported by pillow    1 x 10, Red TB   1 x 10, YTB   Standing R/L GHJ ER    2 x 10 AROM    L sidelying - R GHJ External Rotation    2 x 10 AROM    1 x 10 1# DB - only able to achieve 35 before compensation     Seated Scaption    R: 1 x 10, 1# DB; 1 x 10 2# DB   L: 1 x 10, AROM    Therapeutic Activity:  AAROM Shoulder Flexion/Adb using yard stick   2 x 10 ea dir, RUE assist LUE    R Shoulder Elbow Flexion to OH Press    2 x 10, 2# DB      PATIENT EDUCATION:  Education details: POC, HEP, Prognosis Person educated: Patient Education method: Explanation, Demonstration, and Handouts Education comprehension: verbalized understanding and returned demonstration   HOME EXERCISE PROGRAM:  Access Code: [email or web address] URL:  [email or web address] Date: 01/01/2024 Prepared by: Lonni Inaaya Vellucci  Exercises - Seated Shoulder Flexion Towel Slide at Table Top  - 1 x daily - 7 x weekly - 3 sets - 10 reps - Seated Shoulder Abduction AAROM with Pulley at Side  - 1 x daily - 7 x weekly - 3 sets - 20 reps - Standing Shoulder Flexion AAROM with Arms Bent and Pulley Behind  - 1 x daily - 7 x weekly - 3 sets - 20 reps - Seated Single Arm Shoulder External Rotation with Towel  - 1 x daily - 3-4 x weekly - 2-3 sets - 10 reps - Supine Shoulder External and Internal Rotation in Abduction with Dumbbell  - 1 x daily - 3-4 x weekly - 2-3 sets - 10 reps - Standing Isometric Shoulder External Rotation with Doorway and Towel Roll  - 1 x daily - 3-4 x weekly - 3 sets - 10s hold - Standing Single Arm Shoulder Abduction with Dumbbell - Palm Down  - 1 x daily - 3-4 x weekly - 2-3 sets - 10 reps   Access Code: [email or web address] URL: [email or web address] Date: 12/26/2023 Prepared by: Lonni Pall  Exercises - Seated Shoulder Abduction AAROM with Pulley at Side  - 1 x daily - 7 x weekly - 3 sets - 20 reps - Standing Shoulder Flexion AAROM with Arms Bent and Pulley Behind  - 1 x daily - 7 x weekly - 3 sets - 20 reps - Seated Single Arm Shoulder External Rotation with Towel  - 1 x daily - 3-4 x weekly - 2-3 sets - 10 reps - Supine Shoulder External and Internal Rotation in Abduction with Dumbbell  - 1 x daily - 3-4 x weekly - 2-3 sets - 10 reps  ASSESSMENT:  CLINICAL IMPRESSION: Continued PT POC with a main focus on treatment of R Rotator Cuff Tendinitis and L Reverse Total Shoulder Replacement. Patient tolerated all interventions without pain in either shoulder. Continued focus on L shoulder mobility, R rotator cuff strengthening and bilateral UE strength. Good demonstration of R external rotation with AROM in sidelying and maintaining isometric ER with walkout but compensation occurs with increased resistance. She  continues to demonstrated good tolerance with AAROM in her L shoulder. PT will continue focusing on progressing AAROM to AROM in the L shoulder. Good elbow strength noted in bilateral UE. PT plans to continue gradual  progression of left shoulder mobility and strength while addressing compensatory patterns and endurance deficits in the right upper extremity. Based on today's performance, pt will continue to benefit from skilled PT in order to facilitate return to PLOF and improve QoL.   OBJECTIVE IMPAIRMENTS: decreased endurance, decreased mobility, decreased ROM, decreased strength, hypomobility, increased muscle spasms, and pain.   ACTIVITY LIMITATIONS: carrying, lifting, sleeping, reach over head, and hygiene/grooming  PARTICIPATION LIMITATIONS: meal prep, cleaning, and laundry  PERSONAL FACTORS: Age, Behavior pattern, Past/current experiences, Time since onset of injury/illness/exacerbation, and 1-2 comorbidities: s/p Capsulectomy, HTN are also affecting patient's functional outcome.   REHAB POTENTIAL: Fair Patient with significant weakness in R shoulder in abduction and flexion with visible shoulder shrug sign. L GHJ developing rotator cuff pathologies due to compensating for R sided weakness.   CLINICAL DECISION MAKING: Evolving/moderate complexity  EVALUATION COMPLEXITY: Moderate   GOALS: Goals reviewed with patient? Yes  SHORT TERM GOALS: Target date: 03/12/2024   Pt will be independent with HEP to improve strength and decrease shoulder pain to improve pain-free function at home and work. Baseline: Initial HEP provided Goal status: INITIAL   LONG TERM GOALS: Target date: 04/09/2024   Pt will increase R Shoulder abd to 110 without scapular elevation in order to demonstrate significant improvements in shoulder functional and overheads reaching.  Baseline: 12/27/2023: ~90 Goal status: INITIAL  2.  Pt will decrease worst R shoulder pain by at least 3 points on the NPRS in order  to demonstrate clinically significant reduction in shoulder pain. Baseline: 12/27/2023: 10/10 NPS Goal status: INITIAL  3.  Pt will decrease quick DASH score by at least 8% in order to demonstrate clinically significant reduction in disability related to shoulder pain        Baseline: Deferred to next visit Goal status: INITIAL  4. Pt will increase R shoulder flex/abd, IR/ER strength by at least 1/2 MMT grade in order to demonstrate improvement in strength and function Baseline:  Flexion 3+ 3+  Extension    Abduction 3+ 4  External rotation 4- 4  Internal rotation 4- 4   Goal status: INITIAL  5.  Pt will increase L shoulder flex/abd AROM to 110 in order to demonstrate improvements in ROM and functional mobility.         Baseline: 01/21/2024: 0 due to surgical protocol.  Goal status: INITIAL    PLAN: PT FREQUENCY: 1-2x/week  PT DURATION: 8 weeks  PLANNED INTERVENTIONS: Therapeutic exercises, Therapeutic activity, Neuromuscular re-education, Balance training, Gait training, Patient/Family education, Self Care, Joint mobilization, Joint manipulation, Vestibular training, Canalith repositioning, Orthotic/Fit training, DME instructions, Dry Needling, Electrical stimulation, Spinal manipulation, Spinal mobilization, Cryotherapy, Moist heat, Taping, Traction, Ultrasound, Ionotophoresis 4mg /ml Dexamethasone , Manual therapy, and Re-evaluation.  PLAN FOR NEXT SESSION: Review HEP, Initiate Shoulder strengthening, rotator cuff strength, parascapular strength   Lonni Pall PT, DPT Physical Therapist- [place]  02/13/2024, 3:20 PM

## 2024-02-17 ENCOUNTER — Ambulatory Visit

## 2024-02-17 DIAGNOSIS — M25511 Pain in right shoulder: Secondary | ICD-10-CM | POA: Diagnosis not present

## 2024-02-17 DIAGNOSIS — M6281 Muscle weakness (generalized): Secondary | ICD-10-CM

## 2024-02-17 DIAGNOSIS — G8929 Other chronic pain: Secondary | ICD-10-CM | POA: Diagnosis not present

## 2024-02-17 DIAGNOSIS — M25512 Pain in left shoulder: Secondary | ICD-10-CM | POA: Diagnosis not present

## 2024-02-17 DIAGNOSIS — M25612 Stiffness of left shoulder, not elsewhere classified: Secondary | ICD-10-CM | POA: Diagnosis not present

## 2024-02-17 NOTE — Therapy (Signed)
 OUTPATIENT PHYSICAL THERAPY SHOULDER/ELBOW TREATMENT  Patient Name: Krystal [name] MRN: [id number] DOB:[birth year], 73 y.o., female Today's Date: 02/17/2024  END OF SESSION:  PT End of Session - 02/17/24 1436     Visit Number 8    Number of Visits 17    Date for PT Re-Evaluation 02/21/24    PT Start Time 1431    PT Stop Time 1514    PT Time Calculation (min) 43 min    Activity Tolerance Patient tolerated treatment well    Behavior During Therapy [facility] for tasks assessed/performed          Past Medical History:  Diagnosis Date   Anemia    Arthritis    osteo   Blindness of right eye at birth   Cancer [facility]) 2011   Left breast 2011 and cervical age 73   Carpal tunnel syndrome    Cataract    Depression    Edema 07/27/2011   Fibromyalgia    muscle weakness and pain   GERD (gastroesophageal reflux disease)    Hyperlipidemia    Hypertension    benign   Lymphedema    Osteoporosis    Plantar fasciitis    Pre-diabetes    Raynaud's disease    Restless leg syndrome    Rosacea    Rotator cuff rupture    Sleep apnea    Synovitis    Ulcer    Past Surgical History:  Procedure Laterality Date   ABDOMINAL HYSTERECTOMY  1986   For bleeding and pain   ATRIAL FIBRILLATION ABLATION N/A 05/14/2023   Procedure: ATRIAL FIBRILLATION ABLATION;  Surgeon: Cindie Ole DASEN, MD;  [facility]: [facility];  Service: Cardiovascular;  Laterality: N/A;   BLADDER SURGERY  2006   Bladder tack   BREAST RECONSTRUCTION Bilateral 09/17/2012   Procedure: BREAST RECONSTRUCTION;  Surgeon: Estefana Reichert, DO;  Location: [facility];  Service: Plastics;  Laterality: Bilateral;  BILATERAL BREAST RECONSTRUCTION WITH TISSUE EXPANDERS AND ALLOMED   BREAST RECONSTRUCTION Right 06/03/2013   Procedure: RIGHT BREAST CAPSULE CONSTRACTURE;  Surgeon: Estefana Reichert, DO;  Location: [facility];  Service: Plastics;  Laterality: Right;   CAPSULECTOMY Right 11/18/2023   Procedure:  EXCISION OF RIGHT BREAST CAPSULE CONTRACTURE AND BREAST TISSUE;  Surgeon: Lowery Estefana RAMAN, DO;  Location: [facility];  Service: Plastics;  Laterality: Right;   CARDIOVERSION N/A 08/06/2022   Procedure: CARDIOVERSION;  Surgeon: Darron Deatrice LABOR, MD;  Location: [facility];  Service: Cardiovascular;  Laterality: N/A;   CARDIOVERSION N/A 10/02/2022   Procedure: CARDIOVERSION;  Surgeon: Mady Bruckner, MD;  Location: [facility];  Service: Cardiovascular;  Laterality: N/A;   CARPAL TUNNEL RELEASE Bilateral 2008   EYE SURGERY Right    INJECTION KNEE Left 08/12/2018   Procedure: KNEE INJECTION-LEFT;  Surgeon: Edie Norleen PARAS, MD;  Location: [facility];  Service: Orthopedics;  Laterality: Left;   JOINT REPLACEMENT     LIPOSUCTION Bilateral 01/29/2013   Procedure: LIPOSUCTION;  Surgeon: Estefana Reichert, DO;  Location: [facility];  Service: Plastics;  Laterality: Bilateral;   LIPOSUCTION Right 06/03/2013   Procedure: LIPOSUCTION;  Surgeon: Estefana Reichert, DO;  Location: [facility];  Service: Plastics;  Laterality: Right;   LIPOSUCTION Right 11/18/2023   Procedure: LIPOSUCTION LATERAL RIGHT CHEST;  Surgeon: Lowery Estefana RAMAN, DO;  Location: [facility];  Service: Plastics;  Laterality: Right;   MASTECTOMY  2012   rt prophalactic mast-snbx   MASTECTOMY MODIFIED RADICAL  2012  left-axillary nodes   NEUROMA SURGERY Bilateral    NOSE SURGERY  2007   PORT-A-CATH REMOVAL     insertion and   REVERSE SHOULDER ARTHROPLASTY Left 01/16/2024   Procedure: ARTHROPLASTY, SHOULDER, TOTAL, REVERSE;  Surgeon: Melita Drivers, MD;  Location: [facility];  Service: Orthopedics;  Laterality: Left;   REVISION, RECONSTRUCTION, BREAST Right 11/18/2023   Procedure: REVISION RIGHT BREAST RECONSTRUCTION;  Surgeon: Lowery Estefana RAMAN, DO;  Location: [facility];  Service: Plastics;  Laterality: Right;  Excision of scar, capsule contracture right breast , possible  liposuction   THROAT SURGERY     TONSILLECTOMY     TOTAL KNEE ARTHROPLASTY Right 08/12/2018   Procedure: TOTAL KNEE ARTHROPLASTY-RIGHT;  Surgeon: Edie Norleen PARAS, MD;  Location: [facility];  Service: Orthopedics;  Laterality: Right;   TOTAL KNEE ARTHROPLASTY Left 09/22/2019   Procedure: TOTAL KNEE ARTHROPLASTY;  Surgeon: Edie Norleen PARAS, MD;  Location: [facility];  Service: Orthopedics;  Laterality: Left;   UVULOPALATOPHARYNGOPLASTY     Patient Active Problem List   Diagnosis Date Noted   Raynaud's disease 11/05/2022   Osteoporosis, post-menopausal 11/05/2022   Atrial flutter (HCC) 10/02/2022   First degree AV block 09/11/2022   Arthritis 08/08/2022   Persistent atrial fibrillation (HCC) 08/06/2022   Depression 02/27/2022   Gastroesophageal reflux disease 02/27/2022   Hypertension 02/27/2022   Abnormal LFTs 02/27/2022   Alcohol  abuse 02/27/2022   Atrial fibrillation with RVR (HCC) 02/27/2022   Morbid obesity (HCC) 02/27/2022   Pes anserinus bursitis of left knee 02/08/2020   Status post total knee replacement using cement, left 09/22/2019   Status post total knee replacement using cement, right 08/12/2018   Rotator cuff rupture    Status post bilateral breast reconstruction 02/06/2013   Acquired absence of bilateral breasts and nipples 09/17/2012   Breast cancer of lower-outer quadrant of left female breast (HCC) 01/16/2011    PCP: Valora Agent, MD   REFERRING PROVIDER:  Andris Estefana BRAVO, PA-C   REFERRING DIAG:  M75.101 (ICD-10-CM) - Unspecified rotator cuff tear or rupture of right shoulder, not specified as traumatic    RATIONALE FOR EVALUATION AND TREATMENT: Rehabilitation  THERAPY DIAG: Chronic pain of both shoulders  Muscle weakness (generalized)  Stiffness of left shoulder, not elsewhere classified  ONSET DATE: 11/18/2023  FOLLOW-UP APPT SCHEDULED WITH REFERRING PROVIDER: Yes    SUBJECTIVE:                                                                                                                                                                                          SUBJECTIVE STATEMENT:    Patient with chief concern of bilateral  shoulder pain and limited ROM.   PERTINENT HISTORY:   REEVAL (01/22/2024):  Patient s/p 01/16/2024 Reverse Total Shoulder in the L arm. Patient reports 3/10 NPS in the Left Shoulder. Things have going well since the surgery. She has been doing dishes, lifting small objects, and small dog. She is adherent to her HEP exercises and utilizes ice for pain modulation. She is still active and doing activities as tolerated. Difficulty with reaching and picking up items with her RUE due to grip weakness and shoulder weakness.   Presents to OPPT with LLE in a shoulder sling.    INITIAL EVAL Patient is a 73 year old female with history of breast cancer. She most recently underwent excision of right breast capsule contracture and right breast tissue, advancement of inferior right chest and liposuction of the lateral right chest with Dr. Lowery on 11/18/2023. She she denies any fevers or chills.  No stitches or soreness along the R breast tissue/shoulder. She denies any drainage. She also reports that she has been having some difficulties with range of motion of her right upper extremity.   Pt reports that she has difficulties with lifting a gallon of milk to a top shelf with her RUE; she has to use her L arm to assist her R arm. She reports pain in both arms but R > L. Her L arm is starting to develop pain due to compensating for the R arm. Denies n/t.   She has lifting restrictions, no greater than 10 pounds for 6 weeks postoperatively. (12/30/2023)   Dominant hand: right   PAIN:  Pain Intensity: Present: 1/10, Best: 3/10, Worst: 10/10 Pain location: Bilateral Shoulder  Pain Quality: constant, sharp, and aching  Radiating: Yes  Numbness/Tingling: No Focal Weakness: Yes Aggravating factors: Repetitive Overhead Movements,  Laying on R shoulder,  Relieving factors: Ice Modalities 24-hour pain behavior: Activity Dependent History of prior shoulder or neck/shoulder injury, pain, surgery, or therapy: Yes Imaging: Yes  Red flags (personal history of cancer, chills/fever, night sweats, nausea, vomiting, unrelenting pain, unexplained weight gain/loss): Negative  PRECAUTIONS: None  WEIGHT BEARING RESTRICTIONS: Yes She has lifting restrictions, no greater than 10 pounds for 6 weeks postoperatively.  FALLS: Has patient fallen in last 6 months? No  Living Environment Lives with: lives with their family Lives in: House/apartment Stairs: Yes: Internal: 13 steps; on right going up and External: 5 steps; can reach both Has following equipment at home: None  Prior level of function: Independent  Occupational demands: Retired   Hobbies: Dog (76 yr old)  Patient Goals: Movement and less pain    OBJECTIVE:   Patient Surveys  FOTO: QuickDASH:  Cognition Patient is oriented to person, place, and time.  Recent memory is intact.  Remote memory is intact.  Attention span and concentration are intact.  Expressive speech is intact.  Patient's fund of knowledge is within normal limits for educational level.    Gross Musculoskeletal Assessment Tremor: None Bulk: Normal Tone: Normal   Posture Seated: Forward, Rounded Shoulder, increase thoracic kyphosis  Cervical Screen AROM: WFL and painless with overpressure in all planes Spurlings A (ipsilateral lateral flexion/axial compression): R: Negative L: Not examined Spurlings B (ipsilateral lateral flexion/contralateral rotation/axial compression): R: Not examined L: Not examined Repeated movement:Deferred a   AROM AROM (Normal range in degrees) AROM/PROM  Cervical  Flexion (50) WNL  Extension (80) WNL  Right lateral flexion (45) WNL  Left lateral flexion (45) WNL  Right rotation (85) WNL  Left rotation (85) WNL   Right  Left (01/22/24)  Shoulder     Flexion 180 Deferred/150  Extension    Abduction 90/180 Deferred/90  External Rotation 90   Internal Rotation 70/85 Deferred/50  Hands Behind Head WNL WNL  Hands Behind Back WNL Surgical precautions      Elbow    Flexion WNL WNL  Extension WNL WNL  Pronation WNL WNL  Supination WNL WNl  (* = pain; Blank rows = not tested)  UE MMT: MMT (out of 5) Right Left   Cervical (isometric)  Flexion WNL  Extension WNL  Lateral Flexion WNL WNL  Rotation WNL WNL      Shoulder   Flexion 3+ 3+  Extension    Abduction 3+ 4  External rotation 4- 4  Internal rotation 4- 4  Horizontal abduction    Horizontal adduction    Lower Trapezius    Rhomboids        Elbow  Flexion 5 5  Extension 5 5  Pronation 5 5  Supination 5 5      Wrist  Flexion    Extension    Radial deviation    Ulnar deviation        MCP  Flexion    Extension    Abduction    Adduction    (* = pain; Blank rows = not tested)  Sensation Grossly intact to light touch bilateral UE as determined by testing dermatomes C2-T2. Proprioception and hot/cold testing deferred on this date.  Reflexes Deferred   Palpation Location LEFT  RIGHT           Subocciptials    Cervical paraspinals 1 1  Upper Trapezius 1 1  Levator Scapulae    Rhomboid Major/Minor    Sternoclavicular joint    Acromioclavicular joint    Coracoid process    Long head of biceps    Supraspinatus 1 1  Infraspinatus 2 2  Subscapularis 1 1  Teres Minor    Teres Major    Pectoralis Major 1 1  Pectoralis Minor 1 1  Anterior Deltoid    Lateral Deltoid 1 1  Posterior Deltoid 1 1  Latissimus Dorsi    Sternocleidomastoid    (Blank rows = not tested) Graded on 0-4 scale (0 = no pain, 1 = pain, 2 = pain with wincing/grimacing/flinching, 3 = pain with withdrawal, 4 = unwilling to allow palpation), (Blank rows = not tested)   Passive Accessory Intervertebral Motion Deferred  Accessory Motions/Glides Glenohumeral: Posterior: R:  normal L: abnormal Inferior: R: normal L: abnormal Anterior: R: normal L: not examined   SPECIAL TESTS Rotator Cuff  Drop Arm Test: Negative Painful Arc (Pain from 60 to 120 degrees scaption): Positive Infraspinatus Muscle Test: Positive If all 3 tests positive, the probability of a full-thickness rotator cuff tear is 91%   Bicep Tendon Pathology Speed (shoulder flexion to 90, external rotation, full elbow extension, and forearm supination with resistance: Negative Yergason's (resisted shoulder ER and supination/biceps tendon pathology): Negative  TODAY'S TREATMENT: DATE: 02/17/24  Subjective: Patient reports 3-5/10 NPS in both shoulders. Patient reports that she did a lot of activities in the last two days negating activity modification. She reports that she was doing a lot of planting, potting, weeding and cleaning throughout the house.  No further questions or concerns.   Therapeutic Exercise:    Standing Elbow Flexion - Supinated Grip    2 x 10, 5# DB  Standing Elbow Extension against resistance  3 x 10, Blue TB   L Sidelying -  R GHJ ER against resistance - elbow supported with pillow roll   1 x 10, AROM   3 x 10, 2# DB     Standing Scapular Row    3 x 10, Blue TB - tactile cues for posterior shoulder activation rather than elbow flexion    Therapeutic Activity:   Standing AAROM Shoulder Flexion using Yd Stick  2 x 10, increased OH reach with L to 130  Standing AAROM Shoulder Abduction using Yd Stick  2 x 10, increased abd reach with L to 90   Resisted OH reach to targets (two sticky notes on R/L) in order to increase OH reaching with plates    1 x 10, 3# DB    1 x 10, 4# DB    Modified Bent over row - Stabilizing hand on blue bolster    2 x 10 - Ea UE - 5# DB      PATIENT EDUCATION:  Education details: HEP, Prognosis Person educated: Patient Education method: Explanation, Demonstration, and Handouts Education comprehension: verbalized understanding and  returned demonstration   HOME EXERCISE PROGRAM:  Access Code: [email or web address] URL: [email or web address] Date: 01/01/2024 Prepared by: Lonni Shaquel Chavous  Exercises - Seated Shoulder Flexion Towel Slide at Table Top  - 1 x daily - 7 x weekly - 3 sets - 10 reps - Seated Shoulder Abduction AAROM with Pulley at Side  - 1 x daily - 7 x weekly - 3 sets - 20 reps - Standing Shoulder Flexion AAROM with Arms Bent and Pulley Behind  - 1 x daily - 7 x weekly - 3 sets - 20 reps - Seated Single Arm Shoulder External Rotation with Towel  - 1 x daily - 3-4 x weekly - 2-3 sets - 10 reps - Supine Shoulder External and Internal Rotation in Abduction with Dumbbell  - 1 x daily - 3-4 x weekly - 2-3 sets - 10 reps - Standing Isometric Shoulder External Rotation with Doorway and Towel Roll  - 1 x daily - 3-4 x weekly - 3 sets - 10s hold - Standing Single Arm Shoulder Abduction with Dumbbell - Palm Down  - 1 x daily - 3-4 x weekly - 2-3 sets - 10 reps   Access Code: [email or web address] URL: [email or web address] Date: 12/26/2023 Prepared by: Lonni Pall  Exercises - Seated Shoulder Abduction AAROM with Pulley at Side  - 1 x daily - 7 x weekly - 3 sets - 20 reps - Standing Shoulder Flexion AAROM with Arms Bent and Pulley Behind  - 1 x daily - 7 x weekly - 3 sets - 20 reps - Seated Single Arm Shoulder External Rotation with Towel  - 1 x daily - 3-4 x weekly - 2-3 sets - 10 reps - Supine Shoulder External and Internal Rotation in Abduction with Dumbbell  - 1 x daily - 3-4 x weekly - 2-3 sets - 10 reps  ASSESSMENT:  CLINICAL IMPRESSION: Continued PT POC with a main focus on treatment of R Rotator Cuff Tendinitis and L Reverse Total Shoulder Replacement. She continues to tolerate increase resistance similar to smaller items at home. PT focused on therapeutic activities in order to optimize OH reaching and pulling weeds in the garden. She demonstrated good ability to lift DB OH with two hands up to  150 without pain in either shoulder. PT plans to continue gradual progression of left shoulder mobility and strength while addressing compensatory patterns and endurance deficits in the right upper extremity. Based on today's performance, pt will continue to  benefit from skilled PT in order to facilitate return to PLOF and improve QoL.   OBJECTIVE IMPAIRMENTS: decreased endurance, decreased mobility, decreased ROM, decreased strength, hypomobility, increased muscle spasms, and pain.   ACTIVITY LIMITATIONS: carrying, lifting, sleeping, reach over head, and hygiene/grooming  PARTICIPATION LIMITATIONS: meal prep, cleaning, and laundry  PERSONAL FACTORS: Age, Behavior pattern, Past/current experiences, Time since onset of injury/illness/exacerbation, and 1-2 comorbidities: s/p Capsulectomy, HTN are also affecting patient's functional outcome.   REHAB POTENTIAL: Fair Patient with significant weakness in R shoulder in abduction and flexion with visible shoulder shrug sign. L GHJ developing rotator cuff pathologies due to compensating for R sided weakness.   CLINICAL DECISION MAKING: Evolving/moderate complexity  EVALUATION COMPLEXITY: Moderate   GOALS: Goals reviewed with patient? Yes  SHORT TERM GOALS: Target date: 03/16/2024   Pt will be independent with HEP to improve strength and decrease shoulder pain to improve pain-free function at home and work. Baseline: Initial HEP provided Goal status: INITIAL   LONG TERM GOALS: Target date: 04/13/2024   Pt will increase R Shoulder abd to 110 without scapular elevation in order to demonstrate significant improvements in shoulder functional and overheads reaching.  Baseline: 12/27/2023: ~90 Goal status: INITIAL  2.  Pt will decrease worst R shoulder pain by at least 3 points on the NPRS in order to demonstrate clinically significant reduction in shoulder pain. Baseline: 12/27/2023: 10/10 NPS Goal status: INITIAL  3.  Pt will decrease quick  DASH score by at least 8% in order to demonstrate clinically significant reduction in disability related to shoulder pain        Baseline: Deferred to next visit Goal status: INITIAL  4. Pt will increase R shoulder flex/abd, IR/ER strength by at least 1/2 MMT grade in order to demonstrate improvement in strength and function Baseline:  Flexion 3+ 3+  Extension    Abduction 3+ 4  External rotation 4- 4  Internal rotation 4- 4   Goal status: INITIAL  5.  Pt will increase L shoulder flex/abd AROM to 110 in order to demonstrate improvements in ROM and functional mobility.         Baseline: 01/21/2024: 0 due to surgical protocol.  Goal status: INITIAL    PLAN: PT FREQUENCY: 1-2x/week  PT DURATION: 8 weeks  PLANNED INTERVENTIONS: Therapeutic exercises, Therapeutic activity, Neuromuscular re-education, Balance training, Gait training, Patient/Family education, Self Care, Joint mobilization, Joint manipulation, Vestibular training, Canalith repositioning, Orthotic/Fit training, DME instructions, Dry Needling, Electrical stimulation, Spinal manipulation, Spinal mobilization, Cryotherapy, Moist heat, Taping, Traction, Ultrasound, Ionotophoresis 4mg /ml Dexamethasone , Manual therapy, and Re-evaluation.  PLAN FOR NEXT SESSION: Review HEP, Initiate Shoulder strengthening, rotator cuff strength, parascapular strength   Lonni Pall PT, DPT Physical Therapist- [place]  02/17/2024, 3:17 PM

## 2024-02-18 ENCOUNTER — Ambulatory Visit: Admitting: Occupational Therapy

## 2024-02-18 ENCOUNTER — Ambulatory Visit

## 2024-02-18 DIAGNOSIS — M5416 Radiculopathy, lumbar region: Secondary | ICD-10-CM | POA: Diagnosis not present

## 2024-02-18 DIAGNOSIS — M4800 Spinal stenosis, site unspecified: Secondary | ICD-10-CM | POA: Diagnosis not present

## 2024-02-18 NOTE — Progress Notes (Unsigned)
 Electrophysiology Office Follow up Visit Note:    Date:  02/19/2024   ID:  Krystal [name], DOB 20-Mar-1951, MRN [id number]  PCP:  Valora Agent, MD  [facility] Cardiologist:  Deatrice Cage, MD  [facility] Electrophysiologist:  OLE ONEIDA HOLTS, MD    Interval History:     Krystal [name] is a 73 y.o. female who presents for a follow up visit.   The patient had a prior catheter ablation for atrial fibrillation and flutter on May 14, 2023.  During the procedure, the pulmonary veins, posterior wall and CTI were ablated.  She last saw Elvie August 19, 2023.  At that appointment she was feeling well without recurrence of atrial fibrillation.  She takes Eliquis  for stroke prophylaxis.  She takes amiodarone  as of the last appointment with Elvie.  At that appointment amiodarone  was stopped and she was continued on diltiazem .  She saw Cadence for preop January 10, 2024.  At that appointment she was maintaining normal rhythm.  She has been doing well.  No known recurrence of atrial fibrillation.  She is undergoing evaluation for back pain thought to be secondary to a bulging disc.       Past medical, surgical, social and family history were reviewed.  ROS:   Please see the history of present illness.    All other systems reviewed and are negative.  EKGs/Labs/Other Studies Reviewed:    The following studies were reviewed today:   January 10, 2024 EKG personally reviewed shows sinus rhythm.  EKG Interpretation Date/Time:  Wednesday February 19 2024 08:20:06 EDT Ventricular Rate:  78 PR Interval:  180 QRS Duration:  90 QT Interval:  412 QTC Calculation: 469 R Axis:   -10  Text Interpretation: sinus rhythm. Confirmed by HOLTS OLE [phone]) on 02/19/2024 8:23:53 AM    Physical Exam:    VS:  BP (!) 161/92   Pulse 78   Ht 5' 3 (1.6 m)   Wt 161 lb 12.8 oz (73.4 kg)   SpO2 98%   BMI 28.66 kg/m     Wt Readings from Last 3 Encounters:  02/19/24 161 lb 12.8 oz  (73.4 kg)  01/16/24 160 lb 1.6 oz (72.6 kg)  01/15/24 160 lb 1.6 oz (72.6 kg)     GEN: no distress CARD: RRR, No MRG RESP: No IWOB. CTAB.      ASSESSMENT:    1. Persistent atrial fibrillation (HCC)   2. Atrial flutter, unspecified type (HCC)   3. Essential hypertension    PLAN:    In order of problems listed above:  #Persistent atrial fibrillation and flutter Maintaining sinus rhythm after her prior catheter ablation.  Continue Eliquis  for stroke prophylaxis. We discussed the rationale and data supporting ongoing anticoagulation with patient's with a history of atrial fibrillation.  I discussed alternatives including using a monitoring strategy to pursue a pill in the pocket strategy of anticoagulation.  We discussed the limited data supporting this strategy.  She will let us  know if she wants to pursue a monitoring strategy in place of daily anticoagulation.  #Hypertension Above goal today.  She believes her blood pressure elevation this morning is secondary to pain.  Recommend checking blood pressures 1-2 times per week at home and recording the values.  Recommend bringing these recordings to the primary care physician. Continue diltiazem , losartan  Increase dose of losartan  to 50  Follow-up with EP in 1 year with APP   Signed, OLE HOLTS, MD, [facility], [facility] 02/19/2024 8:24 AM  Electrophysiology [facility]

## 2024-02-19 ENCOUNTER — Ambulatory Visit: Attending: Cardiology | Admitting: Cardiology

## 2024-02-19 VITALS — BP 161/92 | HR 78 | Ht 63.0 in | Wt 161.8 lb

## 2024-02-19 DIAGNOSIS — I4819 Other persistent atrial fibrillation: Secondary | ICD-10-CM | POA: Diagnosis not present

## 2024-02-19 DIAGNOSIS — I4892 Unspecified atrial flutter: Secondary | ICD-10-CM

## 2024-02-19 DIAGNOSIS — I10 Essential (primary) hypertension: Secondary | ICD-10-CM | POA: Diagnosis not present

## 2024-02-19 MED ORDER — LOSARTAN POTASSIUM 50 MG PO TABS: 50.0000 mg | ORAL_TABLET | Freq: Every day | ORAL | 3 refills | Status: AC

## 2024-02-19 NOTE — Patient Instructions (Signed)
 Medication Instructions:  Your physician has recommended you make the following change in your medication:  1) INCREASE losartan  to 50 mg once daily *If you need a refill on your cardiac medications before your next appointment, please call your pharmacy*   Follow-Up: At [facility], you and your health needs are our priority.  As part of our continuing mission to provide you with exceptional heart care, our providers are all part of one team.  This team includes your primary Cardiologist (physician) and Advanced Practice Providers or APPs (Physician Assistants and Nurse Practitioners) who all work together to provide you with the care you need, when you need it.  Your next appointment:   1 year  Provider:   You may see OLE ONEIDA HOLTS, MD or one of the following Advanced Practice Providers on your designated Care Team:   Charlies Arthur, [place] Ozell Jodie Passey, PA-C Suzann Riddle, NP Daphne Barrack, NP

## 2024-02-20 ENCOUNTER — Ambulatory Visit

## 2024-02-20 DIAGNOSIS — M6281 Muscle weakness (generalized): Secondary | ICD-10-CM | POA: Diagnosis not present

## 2024-02-20 DIAGNOSIS — M25612 Stiffness of left shoulder, not elsewhere classified: Secondary | ICD-10-CM

## 2024-02-20 DIAGNOSIS — M25511 Pain in right shoulder: Secondary | ICD-10-CM | POA: Diagnosis not present

## 2024-02-20 DIAGNOSIS — G8929 Other chronic pain: Secondary | ICD-10-CM | POA: Diagnosis not present

## 2024-02-20 DIAGNOSIS — M25512 Pain in left shoulder: Secondary | ICD-10-CM | POA: Diagnosis not present

## 2024-02-20 NOTE — Therapy (Signed)
 OUTPATIENT PHYSICAL THERAPY SHOULDER/ELBOW TREATMENT  Patient Name: Krystal [name] MRN: [id number] DOB:06/03/1951, 73 y.o., female Today's Date: 02/20/2024  END OF SESSION:  PT End of Session - 02/20/24 1609     Visit Number 9    Number of Visits 17    Date for PT Re-Evaluation 02/21/24    PT Start Time 1602    PT Stop Time 1645    PT Time Calculation (min) 43 min    Activity Tolerance Patient tolerated treatment well    Behavior During Therapy [facility] for tasks assessed/performed          Past Medical History:  Diagnosis Date   Anemia    Arthritis    osteo   Blindness of right eye at birth   Cancer [facility]) 2011   Left breast 2011 and cervical age 17   Carpal tunnel syndrome    Cataract    Depression    Edema 07/27/2011   Fibromyalgia    muscle weakness and pain   GERD (gastroesophageal reflux disease)    Hyperlipidemia    Hypertension    benign   Lymphedema    Osteoporosis    Plantar fasciitis    Pre-diabetes    Raynaud's disease    Restless leg syndrome    Rosacea    Rotator cuff rupture    Sleep apnea    Synovitis    Ulcer    Past Surgical History:  Procedure Laterality Date   ABDOMINAL HYSTERECTOMY  1986   For bleeding and pain   ATRIAL FIBRILLATION ABLATION N/A 05/14/2023   Procedure: ATRIAL FIBRILLATION ABLATION;  Surgeon: Cindie Ole DASEN, MD;  Location: [facility];  Service: Cardiovascular;  Laterality: N/A;   BLADDER SURGERY  2006   Bladder tack   BREAST RECONSTRUCTION Bilateral 09/17/2012   Procedure: BREAST RECONSTRUCTION;  Surgeon: Estefana Reichert, DO;  Location: [facility];  Service: Plastics;  Laterality: Bilateral;  BILATERAL BREAST RECONSTRUCTION WITH TISSUE EXPANDERS AND ALLOMED   BREAST RECONSTRUCTION Right 06/03/2013   Procedure: RIGHT BREAST CAPSULE CONSTRACTURE;  Surgeon: Estefana Reichert, DO;  Location: [facility];  Service: Plastics;  Laterality: Right;   CAPSULECTOMY Right 11/18/2023   Procedure:  EXCISION OF RIGHT BREAST CAPSULE CONTRACTURE AND BREAST TISSUE;  Surgeon: Lowery Estefana RAMAN, DO;  Location: [facility];  Service: Plastics;  Laterality: Right;   CARDIOVERSION N/A 08/06/2022   Procedure: CARDIOVERSION;  Surgeon: Darron Deatrice LABOR, MD;  Location: [facility];  Service: Cardiovascular;  Laterality: N/A;   CARDIOVERSION N/A 10/02/2022   Procedure: CARDIOVERSION;  Surgeon: Mady Bruckner, MD;  Location: [facility];  Service: Cardiovascular;  Laterality: N/A;   CARPAL TUNNEL RELEASE Bilateral 2008   EYE SURGERY Right    INJECTION KNEE Left 08/12/2018   Procedure: KNEE INJECTION-LEFT;  Surgeon: Edie Norleen PARAS, MD;  Location: [facility];  Service: Orthopedics;  Laterality: Left;   JOINT REPLACEMENT     LIPOSUCTION Bilateral 01/29/2013   Procedure: LIPOSUCTION;  Surgeon: Estefana Reichert, DO;  Location: [facility];  Service: Plastics;  Laterality: Bilateral;   LIPOSUCTION Right 06/03/2013   Procedure: LIPOSUCTION;  Surgeon: Estefana Reichert, DO;  Location: [facility];  Service: Plastics;  Laterality: Right;   LIPOSUCTION Right 11/18/2023   Procedure: LIPOSUCTION LATERAL RIGHT CHEST;  Surgeon: Lowery Estefana RAMAN, DO;  Location: [facility];  Service: Plastics;  Laterality: Right;   MASTECTOMY  2012   rt prophalactic mast-snbx   MASTECTOMY MODIFIED RADICAL  2012  left-axillary nodes   NEUROMA SURGERY Bilateral    NOSE SURGERY  2007   PORT-A-CATH REMOVAL     insertion and   REVERSE SHOULDER ARTHROPLASTY Left 01/16/2024   Procedure: ARTHROPLASTY, SHOULDER, TOTAL, REVERSE;  Surgeon: Melita Drivers, MD;  Location: [facility];  Service: Orthopedics;  Laterality: Left;   REVISION, RECONSTRUCTION, BREAST Right 11/18/2023   Procedure: REVISION RIGHT BREAST RECONSTRUCTION;  Surgeon: Lowery Estefana RAMAN, DO;  Location:  SUR[facility]ERY CENTER;  Service: Plastics;  Laterality: Right;  Excision of scar, capsule contracture right breast , possible  liposuction   THROAT SURGERY     TONSILLECTOMY     TOTAL KNEE ARTHROPLASTY Right 08/12/2018   Procedure: TOTAL KNEE ARTHROPLASTY-RIGHT;  Surgeon: Edie Norleen PARAS, MD;  Location: [facility];  Service: Orthopedics;  Laterality: Right;   TOTAL KNEE ARTHROPLASTY Left 09/22/2019   Procedure: TOTAL KNEE ARTHROPLASTY;  Surgeon: Edie Norleen PARAS, MD;  Location: [facility];  Service: Orthopedics;  Laterality: Left;   UVULOPALATOPHARYNGOPLASTY     Patient Active Problem List   Diagnosis Date Noted   Raynaud's disease 11/05/2022   Osteoporosis, post-menopausal 11/05/2022   Atrial flutter (HCC) 10/02/2022   First degree AV block 09/11/2022   Arthritis 08/08/2022   Persistent atrial fibrillation (HCC) 08/06/2022   Depression 02/27/2022   Gastroesophageal reflux disease 02/27/2022   Hypertension 02/27/2022   Abnormal LFTs 02/27/2022   Alcohol  abuse 02/27/2022   Atrial fibrillation with RVR (HCC) 02/27/2022   Morbid obesity (HCC) 02/27/2022   Pes anserinus bursitis of left knee 02/08/2020   Status post total knee replacement using cement, left 09/22/2019   Status post total knee replacement using cement, right 08/12/2018   Rotator cuff rupture    Status post bilateral breast reconstruction 02/06/2013   Acquired absence of bilateral breasts and nipples 09/17/2012   Breast cancer of lower-outer quadrant of left female breast (HCC) 01/16/2011    PCP: Valora Agent, MD   REFERRING PROVIDER:  Andris Estefana BRAVO, PA-C   REFERRING DIAG:  M75.101 (ICD-10-CM) - Unspecified rotator cuff tear or rupture of right shoulder, not specified as traumatic    RATIONALE FOR EVALUATION AND TREATMENT: Rehabilitation  THERAPY DIAG: Chronic pain of both shoulders  Muscle weakness (generalized)  Stiffness of left shoulder, not elsewhere classified  ONSET DATE: 11/18/2023  FOLLOW-UP APPT SCHEDULED WITH REFERRING PROVIDER: Yes    SUBJECTIVE:                                                                                                                                                                                          SUBJECTIVE STATEMENT:    Patient with chief concern of bilateral  shoulder pain and limited ROM.   PERTINENT HISTORY:   REEVAL (01/22/2024):  Patient s/p 01/16/2024 Reverse Total Shoulder in the L arm. Patient reports 3/10 NPS in the Left Shoulder. Things have going well since the surgery. She has been doing dishes, lifting small objects, and small dog. She is adherent to her HEP exercises and utilizes ice for pain modulation. She is still active and doing activities as tolerated. Difficulty with reaching and picking up items with her RUE due to grip weakness and shoulder weakness.   Presents to OPPT with LLE in a shoulder sling.    INITIAL EVAL Patient is a 73 year old female with history of breast cancer. She most recently underwent excision of right breast capsule contracture and right breast tissue, advancement of inferior right chest and liposuction of the lateral right chest with Dr. Lowery on 11/18/2023. She she denies any fevers or chills.  No stitches or soreness along the R breast tissue/shoulder. She denies any drainage. She also reports that she has been having some difficulties with range of motion of her right upper extremity.   Pt reports that she has difficulties with lifting a gallon of milk to a top shelf with her RUE; she has to use her L arm to assist her R arm. She reports pain in both arms but R > L. Her L arm is starting to develop pain due to compensating for the R arm. Denies n/t.   She has lifting restrictions, no greater than 10 pounds for 6 weeks postoperatively. (12/30/2023)   Dominant hand: right   PAIN:  Pain Intensity: Present: 1/10, Best: 3/10, Worst: 10/10 Pain location: Bilateral Shoulder  Pain Quality: constant, sharp, and aching  Radiating: Yes  Numbness/Tingling: No Focal Weakness: Yes Aggravating factors: Repetitive Overhead Movements,  Laying on R shoulder,  Relieving factors: Ice Modalities 24-hour pain behavior: Activity Dependent History of prior shoulder or neck/shoulder injury, pain, surgery, or therapy: Yes Imaging: Yes  Red flags (personal history of cancer, chills/fever, night sweats, nausea, vomiting, unrelenting pain, unexplained weight gain/loss): Negative  PRECAUTIONS: None  WEIGHT BEARING RESTRICTIONS: Yes She has lifting restrictions, no greater than 10 pounds for 6 weeks postoperatively.  FALLS: Has patient fallen in last 6 months? No  Living Environment Lives with: lives with their family Lives in: House/apartment Stairs: Yes: Internal: 13 steps; on right going up and External: 5 steps; can reach both Has following equipment at home: None  Prior level of function: Independent  Occupational demands: Retired   Hobbies: Dog (71 yr old)  Patient Goals: Movement and less pain    OBJECTIVE:   Patient Surveys  FOTO: QuickDASH:  Cognition Patient is oriented to person, place, and time.  Recent memory is intact.  Remote memory is intact.  Attention span and concentration are intact.  Expressive speech is intact.  Patient's fund of knowledge is within normal limits for educational level.    Gross Musculoskeletal Assessment Tremor: None Bulk: Normal Tone: Normal   Posture Seated: Forward, Rounded Shoulder, increase thoracic kyphosis  Cervical Screen AROM: WFL and painless with overpressure in all planes Spurlings A (ipsilateral lateral flexion/axial compression): R: Negative L: Not examined Spurlings B (ipsilateral lateral flexion/contralateral rotation/axial compression): R: Not examined L: Not examined Repeated movement:Deferred a   AROM AROM (Normal range in degrees) AROM/PROM  Cervical  Flexion (50) WNL  Extension (80) WNL  Right lateral flexion (45) WNL  Left lateral flexion (45) WNL  Right rotation (85) WNL  Left rotation (85) WNL   Right  Left (01/22/24)  Shoulder     Flexion 180 Deferred/150  Extension    Abduction 90/180 Deferred/90  External Rotation 90   Internal Rotation 70/85 Deferred/50  Hands Behind Head WNL WNL  Hands Behind Back WNL Surgical precautions      Elbow    Flexion WNL WNL  Extension WNL WNL  Pronation WNL WNL  Supination WNL WNl  (* = pain; Blank rows = not tested)  UE MMT: MMT (out of 5) Right Left   Cervical (isometric)  Flexion WNL  Extension WNL  Lateral Flexion WNL WNL  Rotation WNL WNL      Shoulder   Flexion 3+ 3+  Extension    Abduction 3+ 4  External rotation 4- 4  Internal rotation 4- 4  Horizontal abduction    Horizontal adduction    Lower Trapezius    Rhomboids        Elbow  Flexion 5 5  Extension 5 5  Pronation 5 5  Supination 5 5      Wrist  Flexion    Extension    Radial deviation    Ulnar deviation        MCP  Flexion    Extension    Abduction    Adduction    (* = pain; Blank rows = not tested)  Sensation Grossly intact to light touch bilateral UE as determined by testing dermatomes C2-T2. Proprioception and hot/cold testing deferred on this date.  Reflexes Deferred   Palpation Location LEFT  RIGHT           Subocciptials    Cervical paraspinals 1 1  Upper Trapezius 1 1  Levator Scapulae    Rhomboid Major/Minor    Sternoclavicular joint    Acromioclavicular joint    Coracoid process    Long head of biceps    Supraspinatus 1 1  Infraspinatus 2 2  Subscapularis 1 1  Teres Minor    Teres Major    Pectoralis Major 1 1  Pectoralis Minor 1 1  Anterior Deltoid    Lateral Deltoid 1 1  Posterior Deltoid 1 1  Latissimus Dorsi    Sternocleidomastoid    (Blank rows = not tested) Graded on 0-4 scale (0 = no pain, 1 = pain, 2 = pain with wincing/grimacing/flinching, 3 = pain with withdrawal, 4 = unwilling to allow palpation), (Blank rows = not tested)   Passive Accessory Intervertebral Motion Deferred  Accessory Motions/Glides Glenohumeral: Posterior: R:  normal L: abnormal Inferior: R: normal L: abnormal Anterior: R: normal L: not examined   SPECIAL TESTS Rotator Cuff  Drop Arm Test: Negative Painful Arc (Pain from 60 to 120 degrees scaption): Positive Infraspinatus Muscle Test: Positive If all 3 tests positive, the probability of a full-thickness rotator cuff tear is 91%   Bicep Tendon Pathology Speed (shoulder flexion to 90, external rotation, full elbow extension, and forearm supination with resistance: Negative Yergason's (resisted shoulder ER and supination/biceps tendon pathology): Negative  TODAY'S TREATMENT: DATE: 02/20/24  Subjective: Patient reports 3/10 in bilateral shoulders. Patient reports that she lifted a 20# bag of mulch yesterday. She reported no pain while lifting the bag but she feels sore today. She arrives to OPPT with a new order from another physician regarding rehab for her lumbar spine. She requesting that POC for shoulder be shortened in order for her to obtain further imaging in her lower back. No further questions or concerns.   Therapeutic Exercise:    Standing Hip Abduction  2 x 10, 3# DB   Standing Elbow Flexion - Supinated Grip    2 x 10, 5# DB  Standing Elbow Extension against resistance  3 x 10, Blue TB   Sidelying - GHJ ER against resistance - elbow supported with pillow roll   1 x 10, AROM   2 x 10, 2# DB Eccentric Focus    Standing Scapular Row    3 x 10, Blue TB - tactile cues for posterior shoulder activation rather than elbow flexion    Therapeutic Activity:   Standing AAROM Shoulder Flexion using Yd Stick  2 x 10, increased OH reach with L to 130  Standing AAROM Shoulder Abduction using Yd Stick  2 x 10, increased abd reach with L to 90  Seated Shoulder Abduction using Pulley on Door    2 x 15    Resisted OH reach to targets (two sticky notes on R/L) in order to increase OH reaching with plates     1 x 10 BUE holding 4# DB      PATIENT EDUCATION:  Education details:  HEP, Prognosis Person educated: Patient Education method: Explanation, Demonstration, and Handouts Education comprehension: verbalized understanding and returned demonstration   HOME EXERCISE PROGRAM:  Access Code: [email or web address] URL: [email or web address] Date: 01/01/2024 Prepared by: Lonni Margareth Kanner  Exercises - Seated Shoulder Flexion Towel Slide at Table Top  - 1 x daily - 7 x weekly - 3 sets - 10 reps - Seated Shoulder Abduction AAROM with Pulley at Side  - 1 x daily - 7 x weekly - 3 sets - 20 reps - Standing Shoulder Flexion AAROM with Arms Bent and Pulley Behind  - 1 x daily - 7 x weekly - 3 sets - 20 reps - Seated Single Arm Shoulder External Rotation with Towel  - 1 x daily - 3-4 x weekly - 2-3 sets - 10 reps - Supine Shoulder External and Internal Rotation in Abduction with Dumbbell  - 1 x daily - 3-4 x weekly - 2-3 sets - 10 reps - Standing Isometric Shoulder External Rotation with Doorway and Towel Roll  - 1 x daily - 3-4 x weekly - 3 sets - 10s hold - Standing Single Arm Shoulder Abduction with Dumbbell - Palm Down  - 1 x daily - 3-4 x weekly - 2-3 sets - 10 reps   Access Code: [email or web address] URL: [email or web address] Date: 12/26/2023 Prepared by: Lonni Pall  Exercises - Seated Shoulder Abduction AAROM with Pulley at Side  - 1 x daily - 7 x weekly - 3 sets - 20 reps - Standing Shoulder Flexion AAROM with Arms Bent and Pulley Behind  - 1 x daily - 7 x weekly - 3 sets - 20 reps - Seated Single Arm Shoulder External Rotation with Towel  - 1 x daily - 3-4 x weekly - 2-3 sets - 10 reps - Supine Shoulder External and Internal Rotation in Abduction with Dumbbell  - 1 x daily - 3-4 x weekly - 2-3 sets - 10 reps  ASSESSMENT:  CLINICAL IMPRESSION: Continued PT POC with a main focus on treatment of R Rotator Cuff Tendinitis and L Reverse Total Shoulder Replacement. Patient continues to peform heavy lifting at home and dismissing advice to refrain from heavy  activities. She continues to show steady progress with AAROM exercises using yard stick and pulley to attain increased ranges. PT focused on continued strengthening of bilateral elbow joint, L shoulder muscles and preservation of R rotator cuff musculature. Good demonstration of minimizing elbow  compensation with sidelying R GHJ ER in today's session; still has difficulty against minimal resistance. PT discussed current POC with patient regarding prognosis of R shoulder and L Total Shoulder replacement. Patient adamant about shortening POC with shoulder in order to proceed with rehab for lumbar spine due to insurance restrictions on further imaging (MRI). She is agreeable to discharging from shoulders within the next couple weeks in order to be evaluated for lumbar spine. At this time she still presents with moderate to severe weakness in bilateral shoulders, R shrug sign and limited AROM in the L shoulder. PT plans to continue gradual progression of left shoulder mobility and strength while addressing compensatory patterns and endurance deficits in the right upper extremity. Based on today's performance, pt will continue to benefit from skilled PT in order to facilitate return to PLOF and improve QoL.   OBJECTIVE IMPAIRMENTS: decreased endurance, decreased mobility, decreased ROM, decreased strength, hypomobility, increased muscle spasms, and pain.   ACTIVITY LIMITATIONS: carrying, lifting, sleeping, reach over head, and hygiene/grooming  PARTICIPATION LIMITATIONS: meal prep, cleaning, and laundry  PERSONAL FACTORS: Age, Behavior pattern, Past/current experiences, Time since onset of injury/illness/exacerbation, and 1-2 comorbidities: s/p Capsulectomy, HTN are also affecting patient's functional outcome.   REHAB POTENTIAL: Fair Patient with significant weakness in R shoulder in abduction and flexion with visible shoulder shrug sign. L GHJ developing rotator cuff pathologies due to compensating for R sided  weakness.   CLINICAL DECISION MAKING: Evolving/moderate complexity  EVALUATION COMPLEXITY: Moderate   GOALS: Goals reviewed with patient? Yes  SHORT TERM GOALS: Target date: 03/19/2024   Pt will be independent with HEP to improve strength and decrease shoulder pain to improve pain-free function at home and work. Baseline: Initial HEP provided Goal status: INITIAL   LONG TERM GOALS: Target date: 04/16/2024   Pt will increase R Shoulder abd to 110 without scapular elevation in order to demonstrate significant improvements in shoulder functional and overheads reaching.  Baseline: 12/27/2023: ~90 Goal status: INITIAL  2.  Pt will decrease worst R shoulder pain by at least 3 points on the NPRS in order to demonstrate clinically significant reduction in shoulder pain. Baseline: 12/27/2023: 10/10 NPS Goal status: INITIAL  3.  Pt will decrease quick DASH score by at least 8% in order to demonstrate clinically significant reduction in disability related to shoulder pain        Baseline: Deferred to next visit Goal status: INITIAL  4. Pt will increase R shoulder flex/abd, IR/ER strength by at least 1/2 MMT grade in order to demonstrate improvement in strength and function Baseline:  Flexion 3+ 3+  Extension    Abduction 3+ 4  External rotation 4- 4  Internal rotation 4- 4   Goal status: INITIAL  5.  Pt will increase L shoulder flex/abd AROM to 110 in order to demonstrate improvements in ROM and functional mobility.         Baseline: 01/21/2024: 0 due to surgical protocol.  Goal status: INITIAL    PLAN: PT FREQUENCY: 1-2x/week  PT DURATION: 8 weeks  PLANNED INTERVENTIONS: Therapeutic exercises, Therapeutic activity, Neuromuscular re-education, Balance training, Gait training, Patient/Family education, Self Care, Joint mobilization, Joint manipulation, Vestibular training, Canalith repositioning, Orthotic/Fit training, DME instructions, Dry Needling, Electrical stimulation,  Spinal manipulation, Spinal mobilization, Cryotherapy, Moist heat, Taping, Traction, Ultrasound, Ionotophoresis 4mg /ml Dexamethasone , Manual therapy, and Re-evaluation.  PLAN FOR NEXT SESSION: Review HEP, Initiate Shoulder strengthening, rotator cuff strength, parascapular strength   Lonni Pall PT, DPT Physical Therapist- [place]  02/20/2024, 4:15  PM

## 2024-02-21 DIAGNOSIS — E1122 Type 2 diabetes mellitus with diabetic chronic kidney disease: Secondary | ICD-10-CM | POA: Diagnosis not present

## 2024-02-21 DIAGNOSIS — N1831 Chronic kidney disease, stage 3a: Secondary | ICD-10-CM | POA: Diagnosis not present

## 2024-02-25 ENCOUNTER — Ambulatory Visit: Attending: Orthopedic Surgery

## 2024-02-25 DIAGNOSIS — M25512 Pain in left shoulder: Secondary | ICD-10-CM | POA: Diagnosis not present

## 2024-02-25 DIAGNOSIS — R262 Difficulty in walking, not elsewhere classified: Secondary | ICD-10-CM | POA: Insufficient documentation

## 2024-02-25 DIAGNOSIS — G8929 Other chronic pain: Secondary | ICD-10-CM | POA: Diagnosis not present

## 2024-02-25 DIAGNOSIS — M6281 Muscle weakness (generalized): Secondary | ICD-10-CM | POA: Insufficient documentation

## 2024-02-25 DIAGNOSIS — M25511 Pain in right shoulder: Secondary | ICD-10-CM | POA: Insufficient documentation

## 2024-02-25 DIAGNOSIS — M5459 Other low back pain: Secondary | ICD-10-CM | POA: Insufficient documentation

## 2024-02-25 DIAGNOSIS — I129 Hypertensive chronic kidney disease with stage 1 through stage 4 chronic kidney disease, or unspecified chronic kidney disease: Secondary | ICD-10-CM | POA: Diagnosis not present

## 2024-02-25 DIAGNOSIS — N1831 Chronic kidney disease, stage 3a: Secondary | ICD-10-CM | POA: Diagnosis not present

## 2024-02-25 DIAGNOSIS — E1122 Type 2 diabetes mellitus with diabetic chronic kidney disease: Secondary | ICD-10-CM | POA: Diagnosis not present

## 2024-02-25 DIAGNOSIS — M1990 Unspecified osteoarthritis, unspecified site: Secondary | ICD-10-CM | POA: Diagnosis not present

## 2024-02-25 DIAGNOSIS — M25612 Stiffness of left shoulder, not elsewhere classified: Secondary | ICD-10-CM | POA: Insufficient documentation

## 2024-02-25 DIAGNOSIS — M48062 Spinal stenosis, lumbar region with neurogenic claudication: Secondary | ICD-10-CM | POA: Diagnosis not present

## 2024-02-25 NOTE — Therapy (Signed)
 OUTPATIENT PHYSICAL THERAPY SHOULDER/ELBOW TREATMENT/PROGRESS NOTE  Dates of reporting period  12/26/2023  to  02/25/2024    Patient Name: Krystal [name] MRN: [id number] DOB:02-25-1951, 73 y.o., female Today's Date: 02/25/2024  END OF SESSION:  PT End of Session - 02/25/24 1116     Visit Number 10    Number of Visits 17    Date for PT Re-Evaluation 02/21/24    PT Start Time 1116    PT Stop Time 1158    PT Time Calculation (min) 42 min    Activity Tolerance Patient tolerated treatment well    Behavior During Therapy [facility] for tasks assessed/performed          Past Medical History:  Diagnosis Date   Anemia    Arthritis    osteo   Blindness of right eye at birth   Cancer [facility]) 2011   Left breast 2011 and cervical age 38   Carpal tunnel syndrome    Cataract    Depression    Edema 07/27/2011   Fibromyalgia    muscle weakness and pain   GERD (gastroesophageal reflux disease)    Hyperlipidemia    Hypertension    benign   Lymphedema    Osteoporosis    Plantar fasciitis    Pre-diabetes    Raynaud's disease    Restless leg syndrome    Rosacea    Rotator cuff rupture    Sleep apnea    Synovitis    Ulcer    Past Surgical History:  Procedure Laterality Date   ABDOMINAL HYSTERECTOMY  1986   For bleeding and pain   ATRIAL FIBRILLATION ABLATION N/A 05/14/2023   Procedure: ATRIAL FIBRILLATION ABLATION;  Surgeon: Cindie Ole DASEN, MD;  Location: [facility];  Service: Cardiovascular;  Laterality: N/A;   BLADDER SURGERY  2006   Bladder tack   BREAST RECONSTRUCTION Bilateral 09/17/2012   Procedure: BREAST RECONSTRUCTION;  Surgeon: Estefana Reichert, DO;  Location: [facility];  Service: Plastics;  Laterality: Bilateral;  BILATERAL BREAST RECONSTRUCTION WITH TISSUE EXPANDERS AND ALLOMED   BREAST RECONSTRUCTION Right 06/03/2013   Procedure: RIGHT BREAST CAPSULE CONSTRACTURE;  Surgeon: Estefana Reichert, DO;  Location: [facility];  Service: Plastics;   Laterality: Right;   CAPSULECTOMY Right 11/18/2023   Procedure: EXCISION OF RIGHT BREAST CAPSULE CONTRACTURE AND BREAST TISSUE;  Surgeon: Lowery Estefana RAMAN, DO;  Location: [facility];  Service: Plastics;  Laterality: Right;   CARDIOVERSION N/A 08/06/2022   Procedure: CARDIOVERSION;  Surgeon: Darron Deatrice LABOR, MD;  Location: [facility];  Service: Cardiovascular;  Laterality: N/A;   CARDIOVERSION N/A 10/02/2022   Procedure: CARDIOVERSION;  Surgeon: Mady Bruckner, MD;  Location: [facility];  Service: Cardiovascular;  Laterality: N/A;   CARPAL TUNNEL RELEASE Bilateral 2008   EYE SURGERY Right    INJECTION KNEE Left 08/12/2018   Procedure: KNEE INJECTION-LEFT;  Surgeon: Edie Norleen PARAS, MD;  Location: [facility];  Service: Orthopedics;  Laterality: Left;   JOINT REPLACEMENT     LIPOSUCTION Bilateral 01/29/2013   Procedure: LIPOSUCTION;  Surgeon: Estefana Reichert, DO;  Location: [facility];  Service: Plastics;  Laterality: Bilateral;   LIPOSUCTION Right 06/03/2013   Procedure: LIPOSUCTION;  Surgeon: Estefana Reichert, DO;  Location: [facility];  Service: Plastics;  Laterality: Right;   LIPOSUCTION Right 11/18/2023   Procedure: LIPOSUCTION LATERAL RIGHT CHEST;  Surgeon: Lowery Estefana RAMAN, DO;  Location: [facility];  Service: Plastics;  Laterality: Right;   MASTECTOMY  2012   rt prophalactic mast-snbx   MASTECTOMY MODIFIED RADICAL  2012   left-axillary nodes   NEUROMA SURGERY Bilateral    NOSE SURGERY  2007   PORT-A-CATH REMOVAL     insertion and   REVERSE SHOULDER ARTHROPLASTY Left 01/16/2024   Procedure: ARTHROPLASTY, SHOULDER, TOTAL, REVERSE;  Surgeon: Melita Drivers, MD;  Location: [facility];  Service: Orthopedics;  Laterality: Left;   REVISION, RECONSTRUCTION, BREAST Right 11/18/2023   Procedure: REVISION RIGHT BREAST RECONSTRUCTION;  Surgeon: Lowery Estefana RAMAN, DO;  Location: [facility];  Service: Plastics;  Laterality: Right;   Excision of scar, capsule contracture right breast , possible liposuction   THROAT SURGERY     TONSILLECTOMY     TOTAL KNEE ARTHROPLASTY Right 08/12/2018   Procedure: TOTAL KNEE ARTHROPLASTY-RIGHT;  Surgeon: Edie Norleen PARAS, MD;  Location: [facility];  Service: Orthopedics;  Laterality: Right;   TOTAL KNEE ARTHROPLASTY Left 09/22/2019   Procedure: TOTAL KNEE ARTHROPLASTY;  Surgeon: Edie Norleen PARAS, MD;  Location: [facility];  Service: Orthopedics;  Laterality: Left;   UVULOPALATOPHARYNGOPLASTY     Patient Active Problem List   Diagnosis Date Noted   Raynaud's disease 11/05/2022   Osteoporosis, post-menopausal 11/05/2022   Atrial flutter (HCC) 10/02/2022   First degree AV block 09/11/2022   Arthritis 08/08/2022   Persistent atrial fibrillation (HCC) 08/06/2022   Depression 02/27/2022   Gastroesophageal reflux disease 02/27/2022   Hypertension 02/27/2022   Abnormal LFTs 02/27/2022   Alcohol  abuse 02/27/2022   Atrial fibrillation with RVR (HCC) 02/27/2022   Morbid obesity (HCC) 02/27/2022   Pes anserinus bursitis of left knee 02/08/2020   Status post total knee replacement using cement, left 09/22/2019   Status post total knee replacement using cement, right 08/12/2018   Rotator cuff rupture    Status post bilateral breast reconstruction 02/06/2013   Acquired absence of bilateral breasts and nipples 09/17/2012   Breast cancer of lower-outer quadrant of left female breast (HCC) 01/16/2011    PCP: Valora Agent, MD   REFERRING PROVIDER:  Andris Estefana BRAVO, PA-C   REFERRING DIAG:  M75.101 (ICD-10-CM) - Unspecified rotator cuff tear or rupture of right shoulder, not specified as traumatic    RATIONALE FOR EVALUATION AND TREATMENT: Rehabilitation  THERAPY DIAG: Chronic pain of both shoulders  Muscle weakness (generalized)  Stiffness of left shoulder, not elsewhere classified  Difficulty in walking, not elsewhere classified  ONSET DATE: 11/18/2023  FOLLOW-UP APPT SCHEDULED WITH  REFERRING PROVIDER: Yes    SUBJECTIVE:  SUBJECTIVE STATEMENT:    Patient with chief concern of bilateral shoulder pain and limited ROM.   PERTINENT HISTORY:   REEVAL (01/22/2024):  Patient s/p 01/16/2024 Reverse Total Shoulder in the L arm. Patient reports 3/10 NPS in the Left Shoulder. Things have going well since the surgery. She has been doing dishes, lifting small objects, and small dog. She is adherent to her HEP exercises and utilizes ice for pain modulation. She is still active and doing activities as tolerated. Difficulty with reaching and picking up items with her RUE due to grip weakness and shoulder weakness.   Presents to OPPT with LLE in a shoulder sling.    INITIAL EVAL Patient is a 73 year old female with history of breast cancer. She most recently underwent excision of right breast capsule contracture and right breast tissue, advancement of inferior right chest and liposuction of the lateral right chest with Dr. Lowery on 11/18/2023. She she denies any fevers or chills.  No stitches or soreness along the R breast tissue/shoulder. She denies any drainage. She also reports that she has been having some difficulties with range of motion of her right upper extremity.   Pt reports that she has difficulties with lifting a gallon of milk to a top shelf with her RUE; she has to use her L arm to assist her R arm. She reports pain in both arms but R > L. Her L arm is starting to develop pain due to compensating for the R arm. Denies n/t.   She has lifting restrictions, no greater than 10 pounds for 6 weeks postoperatively. (12/30/2023)   Dominant hand: right   PAIN:  Pain Intensity: Present: 1/10, Best: 3/10, Worst: 10/10 Pain location: Bilateral Shoulder  Pain Quality: constant, sharp, and aching   Radiating: Yes  Numbness/Tingling: No Focal Weakness: Yes Aggravating factors: Repetitive Overhead Movements, Laying on R shoulder,  Relieving factors: Ice Modalities 24-hour pain behavior: Activity Dependent History of prior shoulder or neck/shoulder injury, pain, surgery, or therapy: Yes Imaging: Yes  Red flags (personal history of cancer, chills/fever, night sweats, nausea, vomiting, unrelenting pain, unexplained weight gain/loss): Negative  PRECAUTIONS: None  WEIGHT BEARING RESTRICTIONS: Yes She has lifting restrictions, no greater than 10 pounds for 6 weeks postoperatively.  FALLS: Has patient fallen in last 6 months? No  Living Environment Lives with: lives with their family Lives in: House/apartment Stairs: Yes: Internal: 13 steps; on right going up and External: 5 steps; can reach both Has following equipment at home: None  Prior level of function: Independent  Occupational demands: Retired   Hobbies: Dog (62 yr old)  Patient Goals: Movement and less pain    OBJECTIVE:   Patient Surveys  FOTO: QuickDASH:  Cognition Patient is oriented to person, place, and time.  Recent memory is intact.  Remote memory is intact.  Attention span and concentration are intact.  Expressive speech is intact.  Patient's fund of knowledge is within normal limits for educational level.    Gross Musculoskeletal Assessment Tremor: None Bulk: Normal Tone: Normal   Posture Seated: Forward, Rounded Shoulder, increase thoracic kyphosis  Cervical Screen AROM: WFL and painless with overpressure in all planes Spurlings A (ipsilateral lateral flexion/axial compression): R: Negative L: Not examined Spurlings B (ipsilateral lateral flexion/contralateral rotation/axial compression): R: Not examined L: Not examined Repeated movement:Deferred a   AROM AROM (Normal range in degrees) AROM/PROM  Cervical  Flexion (50) WNL  Extension (80) WNL  Right lateral flexion (45) WNL  Left  lateral flexion (45) WNL  Right rotation (85) WNL  Left rotation (85) WNL   Right Left (01/22/24)  Shoulder    Flexion 180 Deferred/150  Extension    Abduction 90/180 Deferred/90  External Rotation 90   Internal Rotation 70/85 Deferred/50  Hands Behind Head WNL WNL  Hands Behind Back WNL Surgical precautions      Elbow    Flexion WNL WNL  Extension WNL WNL  Pronation WNL WNL  Supination WNL WNl  (* = pain; Blank rows = not tested)  UE MMT: MMT (out of 5) Right Left   Cervical (isometric)  Flexion WNL  Extension WNL  Lateral Flexion WNL WNL  Rotation WNL WNL      Shoulder   Flexion 3+ 3+  Extension    Abduction 3+ 4  External rotation 4- 4  Internal rotation 4- 4  Horizontal abduction    Horizontal adduction    Lower Trapezius    Rhomboids        Elbow  Flexion 5 5  Extension 5 5  Pronation 5 5  Supination 5 5      Wrist  Flexion    Extension    Radial deviation    Ulnar deviation        MCP  Flexion    Extension    Abduction    Adduction    (* = pain; Blank rows = not tested)  Sensation Grossly intact to light touch bilateral UE as determined by testing dermatomes C2-T2. Proprioception and hot/cold testing deferred on this date.  Reflexes Deferred   Palpation Location LEFT  RIGHT           Subocciptials    Cervical paraspinals 1 1  Upper Trapezius 1 1  Levator Scapulae    Rhomboid Major/Minor    Sternoclavicular joint    Acromioclavicular joint    Coracoid process    Long head of biceps    Supraspinatus 1 1  Infraspinatus 2 2  Subscapularis 1 1  Teres Minor    Teres Major    Pectoralis Major 1 1  Pectoralis Minor 1 1  Anterior Deltoid    Lateral Deltoid 1 1  Posterior Deltoid 1 1  Latissimus Dorsi    Sternocleidomastoid    (Blank rows = not tested) Graded on 0-4 scale (0 = no pain, 1 = pain, 2 = pain with wincing/grimacing/flinching, 3 = pain with withdrawal, 4 = unwilling to allow palpation), (Blank rows = not  tested)   Passive Accessory Intervertebral Motion Deferred  Accessory Motions/Glides Glenohumeral: Posterior: R: normal L: abnormal Inferior: R: normal L: abnormal Anterior: R: normal L: not examined   SPECIAL TESTS Rotator Cuff  Drop Arm Test: Negative Painful Arc (Pain from 60 to 120 degrees scaption): Positive Infraspinatus Muscle Test: Positive If all 3 tests positive, the probability of a full-thickness rotator cuff tear is 91%   Bicep Tendon Pathology Speed (shoulder flexion to 90, external rotation, full elbow extension, and forearm supination with resistance: Negative Yergason's (resisted shoulder ER and supination/biceps tendon pathology): Negative  TODAY'S TREATMENT: DATE: 02/25/24  Subjective: Patient reporting 8/10 pain in bilateral shoulders due to excessive vacuuming in the last two days. She repots fleas within the household and had to do a lot of cleaning in order to clear flea infestation. No further questions or concerns.   Therapeutic Exercise:    Seated R/L GHJ AROM    1 x 20 - AROM (0-55) - External Rotation   1 x 15 - AROM (0-115) - Flexion  1 x 10 - AROM (0- 95) - Abduction   Standing Elbow Flexion - Neutral Grip     R/L: 2 x 10 - 5# DB    Standing Elbow Extension against resistance  3 x 10, Blue TB - minor pain in lower back    Sidelying - GHJ ER    R/L: 2 x 15 AROM     Sidelying - GHJ ER Isometrics   R/L: 2 x 10 x 5s hold (PT resistance at distals wrist)  Seated Shoulder Flexion against resistance  1 x 10 - 90 - 1#DB  - Moderate lower back pain    Therapeutic Activity:   Seated Pully AAROM for upward reaching  1 x 20 - Flexion Direction  1 x 20 - Abduction    Wall Circles LUE on Blue Ball for shoulder stability and reaching   1 x 10 - CCW   Standing Upward Reaching for various targets on Wall    1 x 10 - Moderate lower back pain in R lumbar      PATIENT EDUCATION:  Education details: HEP, Prognosis Person educated:  Patient Education method: Explanation, Demonstration, and Handouts Education comprehension: verbalized understanding and returned demonstration   HOME EXERCISE PROGRAM:  Access Code: [email or web address] URL: [email or web address] Date: 01/01/2024 Prepared by: Lonni Stonewall Doss  Exercises - Seated Shoulder Flexion Towel Slide at Table Top  - 1 x daily - 7 x weekly - 3 sets - 10 reps - Seated Shoulder Abduction AAROM with Pulley at Side  - 1 x daily - 7 x weekly - 3 sets - 20 reps - Standing Shoulder Flexion AAROM with Arms Bent and Pulley Behind  - 1 x daily - 7 x weekly - 3 sets - 20 reps - Seated Single Arm Shoulder External Rotation with Towel  - 1 x daily - 3-4 x weekly - 2-3 sets - 10 reps - Supine Shoulder External and Internal Rotation in Abduction with Dumbbell  - 1 x daily - 3-4 x weekly - 2-3 sets - 10 reps - Standing Isometric Shoulder External Rotation with Doorway and Towel Roll  - 1 x daily - 3-4 x weekly - 3 sets - 10s hold - Standing Single Arm Shoulder Abduction with Dumbbell - Palm Down  - 1 x daily - 3-4 x weekly - 2-3 sets - 10 reps   Access Code: [email or web address] URL: [email or web address] Date: 12/26/2023 Prepared by: Lonni Pall  Exercises - Seated Shoulder Abduction AAROM with Pulley at Side  - 1 x daily - 7 x weekly - 3 sets - 20 reps - Standing Shoulder Flexion AAROM with Arms Bent and Pulley Behind  - 1 x daily - 7 x weekly - 3 sets - 20 reps - Seated Single Arm Shoulder External Rotation with Towel  - 1 x daily - 3-4 x weekly - 2-3 sets - 10 reps - Supine Shoulder External and Internal Rotation in Abduction with Dumbbell  - 1 x daily - 3-4 x weekly - 2-3 sets - 10 reps  ASSESSMENT:  CLINICAL IMPRESSION: Patient arriving to 10th visit warranting progress note and reassessment towards PT goals. Good demonstration of improved AROM in L shoulder following L Reverse TSA. She was able to demonstrate 110 in shoulder flexion, 95 shoulder abd and 55  shoulder ER in the L shoulder meeting surgical protocol. Her shoulder pain has been consistently low throughout this POC with adherence to HEP and PT interventions. Patient limited with standing exercises secondary to severe lower back pain. PT focused  on continued strengthening of bilateral elbow joint, L shoulder muscles and preservation of R rotator cuff musculature. Good demonstration of minimizing elbow compensation with sidelying R GHJ ER in today's session. Her lower back pain in today's session limited standing interventions; mitigated with repeated lumbar flexion. At this time she still presents with moderate to severe weakness in bilateral shoulders, R shrug sign and limited AROM in the L shoulder. PT plans to continue gradual progression of left shoulder mobility and strength while addressing compensatory patterns and endurance deficits in the right upper extremity. Based on today's performance, pt will continue to benefit from skilled PT in order to facilitate return to PLOF and improve QoL.  OBJECTIVE IMPAIRMENTS: decreased endurance, decreased mobility, decreased ROM, decreased strength, hypomobility, increased muscle spasms, and pain.   ACTIVITY LIMITATIONS: carrying, lifting, sleeping, reach over head, and hygiene/grooming  PARTICIPATION LIMITATIONS: meal prep, cleaning, and laundry  PERSONAL FACTORS: Age, Behavior pattern, Past/current experiences, Time since onset of injury/illness/exacerbation, and 1-2 comorbidities: s/p Capsulectomy, HTN are also affecting patient's functional outcome.   REHAB POTENTIAL: Fair Patient with significant weakness in R shoulder in abduction and flexion with visible shoulder shrug sign. L GHJ developing rotator cuff pathologies due to compensating for R sided weakness.   CLINICAL DECISION MAKING: Evolving/moderate complexity  EVALUATION COMPLEXITY: Moderate   GOALS: Goals reviewed with patient? Yes  SHORT TERM GOALS: Target date: 03/24/2024   Pt  will be independent with HEP to improve strength and decrease shoulder pain to improve pain-free function at home and work. Baseline: Initial HEP provided Goal status: INITIAL   LONG TERM GOALS: Target date: 04/21/2024   Pt will increase R Shoulder abd to 110 without scapular elevation in order to demonstrate significant improvements in shoulder functional and overheads reaching.  Baseline: 12/27/2023: ~90; 02/25/2024: 100 visible shoulder shrug Goal status: Progressing  2.  Pt will decrease worst R shoulder pain by at least 3 points on the NPRS in order to demonstrate clinically significant reduction in shoulder pain. Baseline: 12/27/2023: 10/10 NPS; 02/25/2024: 8/10  Goal status: Progressing  3.  Pt will decrease quick DASH score by at least 8% in order to demonstrate clinically significant reduction in disability related to shoulder pain        Baseline: Deferred to next visit Goal status: INITIAL  4. Pt will increase R shoulder flex/abd, IR/ER strength by at least 1/2 MMT grade in order to demonstrate improvement in strength and function Baseline: 12/27/2023:   Flexion 3+ 3+  Extension    Abduction 3+ 4  External rotation 4- 4  Internal rotation 4- 4   Goal status: INITIAL  5.  Pt will increase L shoulder flex/abd AROM to 110 in order to demonstrate improvements in ROM and functional mobility.         Baseline: 01/21/2024: 0 due to surgical protocol; 02/25/2024: 110 Flex/95abd Goal status: Progressing     PLAN: PT FREQUENCY: 1-2x/week  PT DURATION: 8 weeks  PLANNED INTERVENTIONS: Therapeutic exercises, Therapeutic activity, Neuromuscular re-education, Balance training, Gait training, Patient/Family education, Self Care, Joint mobilization, Joint manipulation, Vestibular training, Canalith repositioning, Orthotic/Fit training, DME instructions, Dry Needling, Electrical stimulation, Spinal manipulation, Spinal mobilization, Cryotherapy, Moist heat, Taping, Traction,  Ultrasound, Ionotophoresis 4mg /ml Dexamethasone , Manual therapy, and Re-evaluation.  PLAN FOR NEXT SESSION: QuickDASH, Progress BUE strengthening    Lonni Pall PT, DPT Physical Therapist- [place]  02/25/2024, 12:03 PM

## 2024-02-27 ENCOUNTER — Ambulatory Visit

## 2024-03-03 ENCOUNTER — Ambulatory Visit

## 2024-03-03 DIAGNOSIS — G8929 Other chronic pain: Secondary | ICD-10-CM

## 2024-03-03 DIAGNOSIS — M25512 Pain in left shoulder: Secondary | ICD-10-CM | POA: Diagnosis not present

## 2024-03-03 DIAGNOSIS — M6281 Muscle weakness (generalized): Secondary | ICD-10-CM

## 2024-03-03 DIAGNOSIS — R262 Difficulty in walking, not elsewhere classified: Secondary | ICD-10-CM | POA: Diagnosis not present

## 2024-03-03 DIAGNOSIS — M25612 Stiffness of left shoulder, not elsewhere classified: Secondary | ICD-10-CM | POA: Diagnosis not present

## 2024-03-03 DIAGNOSIS — M25511 Pain in right shoulder: Secondary | ICD-10-CM | POA: Diagnosis not present

## 2024-03-03 DIAGNOSIS — M5459 Other low back pain: Secondary | ICD-10-CM | POA: Diagnosis not present

## 2024-03-03 NOTE — Therapy (Signed)
 OUTPATIENT PHYSICAL THERAPY SHOULDER/ELBOW TREATMENT  Patient Name: Krystal [name] MRN: [id number] DOB:October 28, 1950, 73 y.o., female Today's Date: 03/03/2024  END OF SESSION:  PT End of Session - 03/03/24 1118     Visit Number 11    Number of Visits 17    Date for PT Re-Evaluation 02/21/24    PT Start Time 1117    PT Stop Time 1155    PT Time Calculation (min) 38 min    Activity Tolerance Patient tolerated treatment well    Behavior During Therapy [facility] for tasks assessed/performed          Past Medical History:  Diagnosis Date   Anemia    Arthritis    osteo   Blindness of right eye at birth   Cancer [facility]) 2011   Left breast 2011 and cervical age 71   Carpal tunnel syndrome    Cataract    Depression    Edema 07/27/2011   Fibromyalgia    muscle weakness and pain   GERD (gastroesophageal reflux disease)    Hyperlipidemia    Hypertension    benign   Lymphedema    Osteoporosis    Plantar fasciitis    Pre-diabetes    Raynaud's disease    Restless leg syndrome    Rosacea    Rotator cuff rupture    Sleep apnea    Synovitis    Ulcer    Past Surgical History:  Procedure Laterality Date   ABDOMINAL HYSTERECTOMY  1986   For bleeding and pain   ATRIAL FIBRILLATION ABLATION N/A 05/14/2023   Procedure: ATRIAL FIBRILLATION ABLATION;  Surgeon: Cindie Ole DASEN, MD;  Location: [facility];  Service: Cardiovascular;  Laterality: N/A;   BLADDER SURGERY  2006   Bladder tack   BREAST RECONSTRUCTION Bilateral 09/17/2012   Procedure: BREAST RECONSTRUCTION;  Surgeon: Estefana Reichert, DO;  Location: [facility];  Service: Plastics;  Laterality: Bilateral;  BILATERAL BREAST RECONSTRUCTION WITH TISSUE EXPANDERS AND ALLOMED   BREAST RECONSTRUCTION Right 06/03/2013   Procedure: RIGHT BREAST CAPSULE CONSTRACTURE;  Surgeon: Estefana Reichert, DO;  Location: [facility];  Service: Plastics;  Laterality: Right;   CAPSULECTOMY Right 11/18/2023   Procedure:  EXCISION OF RIGHT BREAST CAPSULE CONTRACTURE AND BREAST TISSUE;  Surgeon: Lowery Estefana RAMAN, DO;  Location: [facility];  Service: Plastics;  Laterality: Right;   CARDIOVERSION N/A 08/06/2022   Procedure: CARDIOVERSION;  Surgeon: Darron Deatrice LABOR, MD;  Location: [facility];  Service: Cardiovascular;  Laterality: N/A;   CARDIOVERSION N/A 10/02/2022   Procedure: CARDIOVERSION;  Surgeon: Mady Bruckner, MD;  Location: [facility];  Service: Cardiovascular;  Laterality: N/A;   CARPAL TUNNEL RELEASE Bilateral 2008   EYE SURGERY Right    INJECTION KNEE Left 08/12/2018   Procedure: KNEE INJECTION-LEFT;  Surgeon: Edie Norleen PARAS, MD;  Location: [facility];  Service: Orthopedics;  Laterality: Left;   JOINT REPLACEMENT     LIPOSUCTION Bilateral 01/29/2013   Procedure: LIPOSUCTION;  Surgeon: Estefana Reichert, DO;  Location: [facility];  Service: Plastics;  Laterality: Bilateral;   LIPOSUCTION Right 06/03/2013   Procedure: LIPOSUCTION;  Surgeon: Estefana Reichert, DO;  Location: [facility];  Service: Plastics;  Laterality: Right;   LIPOSUCTION Right 11/18/2023   Procedure: LIPOSUCTION LATERAL RIGHT CHEST;  Surgeon: Lowery Estefana RAMAN, DO;  Location: [facility];  Service: Plastics;  Laterality: Right;   MASTECTOMY  2012   rt prophalactic mast-snbx   MASTECTOMY MODIFIED RADICAL  2012  left-axillary nodes   NEUROMA SURGERY Bilateral    NOSE SURGERY  2007   PORT-A-CATH REMOVAL     insertion and   REVERSE SHOULDER ARTHROPLASTY Left 01/16/2024   Procedure: ARTHROPLASTY, SHOULDER, TOTAL, REVERSE;  Surgeon: Melita Drivers, MD;  Location: [facility];  Service: Orthopedics;  Laterality: Left;   REVISION, RECONSTRUCTION, BREAST Right 11/18/2023   Procedure: REVISION RIGHT BREAST RECONSTRUCTION;  Surgeon: Lowery Estefana RAMAN, DO;  Location: [facility];  Service: Plastics;  Laterality: Right;  Excision of scar, capsule contracture right breast , possible  liposuction   THROAT SURGERY     TONSILLECTOMY     TOTAL KNEE ARTHROPLASTY Right 08/12/2018   Procedure: TOTAL KNEE ARTHROPLASTY-RIGHT;  Surgeon: Edie Norleen PARAS, MD;  Location: [facility];  Service: Orthopedics;  Laterality: Right;   TOTAL KNEE ARTHROPLASTY Left 09/22/2019   Procedure: TOTAL KNEE ARTHROPLASTY;  Surgeon: Edie Norleen PARAS, MD;  Location: [facility];  Service: Orthopedics;  Laterality: Left;   UVULOPALATOPHARYNGOPLASTY     Patient Active Problem List   Diagnosis Date Noted   Raynaud's disease 11/05/2022   Osteoporosis, post-menopausal 11/05/2022   Atrial flutter (HCC) 10/02/2022   First degree AV block 09/11/2022   Arthritis 08/08/2022   Persistent atrial fibrillation (HCC) 08/06/2022   Depression 02/27/2022   Gastroesophageal reflux disease 02/27/2022   Hypertension 02/27/2022   Abnormal LFTs 02/27/2022   Alcohol  abuse 02/27/2022   Atrial fibrillation with RVR (HCC) 02/27/2022   Morbid obesity (HCC) 02/27/2022   Pes anserinus bursitis of left knee 02/08/2020   Status post total knee replacement using cement, left 09/22/2019   Status post total knee replacement using cement, right 08/12/2018   Rotator cuff rupture    Status post bilateral breast reconstruction 02/06/2013   Acquired absence of bilateral breasts and nipples 09/17/2012   Breast cancer of lower-outer quadrant of left female breast (HCC) 01/16/2011    PCP: Valora Agent, MD   REFERRING PROVIDER:  Andris Estefana BRAVO, PA-C   REFERRING DIAG:  M75.101 (ICD-10-CM) - Unspecified rotator cuff tear or rupture of right shoulder, not specified as traumatic    RATIONALE FOR EVALUATION AND TREATMENT: Rehabilitation  THERAPY DIAG: Chronic pain of both shoulders  Muscle weakness (generalized)  Stiffness of left shoulder, not elsewhere classified  ONSET DATE: 11/18/2023  FOLLOW-UP APPT SCHEDULED WITH REFERRING PROVIDER: Yes    SUBJECTIVE:                                                                                                                                                                                          SUBJECTIVE STATEMENT:    Patient with chief concern of bilateral  shoulder pain and limited ROM.   PERTINENT HISTORY:   REEVAL (01/22/2024):  Patient s/p 01/16/2024 Reverse Total Shoulder in the L arm. Patient reports 3/10 NPS in the Left Shoulder. Things have going well since the surgery. She has been doing dishes, lifting small objects, and small dog. She is adherent to her HEP exercises and utilizes ice for pain modulation. She is still active and doing activities as tolerated. Difficulty with reaching and picking up items with her RUE due to grip weakness and shoulder weakness.   Presents to OPPT with LLE in a shoulder sling.    INITIAL EVAL Patient is a 73 year old female with history of breast cancer. She most recently underwent excision of right breast capsule contracture and right breast tissue, advancement of inferior right chest and liposuction of the lateral right chest with Dr. Lowery on 11/18/2023. She she denies any fevers or chills.  No stitches or soreness along the R breast tissue/shoulder. She denies any drainage. She also reports that she has been having some difficulties with range of motion of her right upper extremity.   Pt reports that she has difficulties with lifting a gallon of milk to a top shelf with her RUE; she has to use her L arm to assist her R arm. She reports pain in both arms but R > L. Her L arm is starting to develop pain due to compensating for the R arm. Denies n/t.   She has lifting restrictions, no greater than 10 pounds for 6 weeks postoperatively. (12/30/2023)   Dominant hand: right   PAIN:  Pain Intensity: Present: 1/10, Best: 3/10, Worst: 10/10 Pain location: Bilateral Shoulder  Pain Quality: constant, sharp, and aching  Radiating: Yes  Numbness/Tingling: No Focal Weakness: Yes Aggravating factors: Repetitive Overhead Movements,  Laying on R shoulder,  Relieving factors: Ice Modalities 24-hour pain behavior: Activity Dependent History of prior shoulder or neck/shoulder injury, pain, surgery, or therapy: Yes Imaging: Yes  Red flags (personal history of cancer, chills/fever, night sweats, nausea, vomiting, unrelenting pain, unexplained weight gain/loss): Negative  PRECAUTIONS: None  WEIGHT BEARING RESTRICTIONS: Yes She has lifting restrictions, no greater than 10 pounds for 6 weeks postoperatively.  FALLS: Has patient fallen in last 6 months? No  Living Environment Lives with: lives with their family Lives in: House/apartment Stairs: Yes: Internal: 13 steps; on right going up and External: 5 steps; can reach both Has following equipment at home: None  Prior level of function: Independent  Occupational demands: Retired   Hobbies: Dog (48 yr old)  Patient Goals: Movement and less pain    OBJECTIVE:   Patient Surveys  FOTO: QuickDASH:  Cognition Patient is oriented to person, place, and time.  Recent memory is intact.  Remote memory is intact.  Attention span and concentration are intact.  Expressive speech is intact.  Patient's fund of knowledge is within normal limits for educational level.    Gross Musculoskeletal Assessment Tremor: None Bulk: Normal Tone: Normal   Posture Seated: Forward, Rounded Shoulder, increase thoracic kyphosis  Cervical Screen AROM: WFL and painless with overpressure in all planes Spurlings A (ipsilateral lateral flexion/axial compression): R: Negative L: Not examined Spurlings B (ipsilateral lateral flexion/contralateral rotation/axial compression): R: Not examined L: Not examined Repeated movement:Deferred a   AROM AROM (Normal range in degrees) AROM/PROM  Cervical  Flexion (50) WNL  Extension (80) WNL  Right lateral flexion (45) WNL  Left lateral flexion (45) WNL  Right rotation (85) WNL  Left rotation (85) WNL   Right  Left (01/22/24)  Shoulder     Flexion 180 Deferred/150  Extension    Abduction 90/180 Deferred/90  External Rotation 90   Internal Rotation 70/85 Deferred/50  Hands Behind Head WNL WNL  Hands Behind Back WNL Surgical precautions      Elbow    Flexion WNL WNL  Extension WNL WNL  Pronation WNL WNL  Supination WNL WNl  (* = pain; Blank rows = not tested)  UE MMT: MMT (out of 5) Right Left   Cervical (isometric)  Flexion WNL  Extension WNL  Lateral Flexion WNL WNL  Rotation WNL WNL      Shoulder   Flexion 3+ 3+  Extension    Abduction 3+ 4  External rotation 4- 4  Internal rotation 4- 4  Horizontal abduction    Horizontal adduction    Lower Trapezius    Rhomboids        Elbow  Flexion 5 5  Extension 5 5  Pronation 5 5  Supination 5 5      Wrist  Flexion    Extension    Radial deviation    Ulnar deviation        MCP  Flexion    Extension    Abduction    Adduction    (* = pain; Blank rows = not tested)  Sensation Grossly intact to light touch bilateral UE as determined by testing dermatomes C2-T2. Proprioception and hot/cold testing deferred on this date.  Reflexes Deferred   Palpation Location LEFT  RIGHT           Subocciptials    Cervical paraspinals 1 1  Upper Trapezius 1 1  Levator Scapulae    Rhomboid Major/Minor    Sternoclavicular joint    Acromioclavicular joint    Coracoid process    Long head of biceps    Supraspinatus 1 1  Infraspinatus 2 2  Subscapularis 1 1  Teres Minor    Teres Major    Pectoralis Major 1 1  Pectoralis Minor 1 1  Anterior Deltoid    Lateral Deltoid 1 1  Posterior Deltoid 1 1  Latissimus Dorsi    Sternocleidomastoid    (Blank rows = not tested) Graded on 0-4 scale (0 = no pain, 1 = pain, 2 = pain with wincing/grimacing/flinching, 3 = pain with withdrawal, 4 = unwilling to allow palpation), (Blank rows = not tested)   Passive Accessory Intervertebral Motion Deferred  Accessory Motions/Glides Glenohumeral: Posterior: R:  normal L: abnormal Inferior: R: normal L: abnormal Anterior: R: normal L: not examined   SPECIAL TESTS Rotator Cuff  Drop Arm Test: Negative Painful Arc (Pain from 60 to 120 degrees scaption): Positive Infraspinatus Muscle Test: Positive If all 3 tests positive, the probability of a full-thickness rotator cuff tear is 91%   Bicep Tendon Pathology Speed (shoulder flexion to 90, external rotation, full elbow extension, and forearm supination with resistance: Negative Yergason's (resisted shoulder ER and supination/biceps tendon pathology): Negative  TODAY'S TREATMENT: DATE: 03/03/24  Subjective: Patient reports that this past weekend she performed a lot of lifting, vacuuming, moving furniture due to a [place]. She reports minimal pain during the weekend activities however she only lifted lighter weighted items. She still completed some home exercises but limited due to fatigue and pain.  No further questions or concerns.   Therapeutic Exercise:    Seated GHJ ER AROM (Elbow Neutral Position)    R/L: 2 x 20 reps    Seated - GHJ ER  Isometrics - Elbow Neutral    R/L: 2 x 10 x 5s hold (PT resistance at distals wrist)     Seated GHJ Flexion against resistance    R/L: 2 x 10 - 2# DB; 1 x 10 - 1#DB     Standing Barbell Curl with weighted dowel (8# DB)    1 x 10 - 8#   2 x 10 - 8#, added 5# AW in the middle    Therapeutic Activity:   UBE - 5 min - 2.5 min Fwd, 2.5 min Retro - Level 8-10 for UE Warm Up, strength and muscular endurance; PT manually adjusted resistance throughout to patient's tolerance.   Seated Pully AAROM for upward reaching  L: 1 x 20 - Shoulder Abduction  L: 1 x 20 - Shoulder Flexion     Standing Wall Circles LUE on Blue Ball for shoulder stability and reaching   2 x 10 - CCW   2 x 10 - CW   Standing Upward Reaching for various targets on Wall (Called out by PT)   L: 2 x 10 - 2# DB    Waiter's Carry with resistance    L: 1 Kg Med Ball - 44  ft    R: 2 Kg MB - 44 ft     L Shoulder Rhythmic Stabilization at 90 degrees Flex    30s x 3 reps - LUE against blue ball - PT perturbations at wrist  PATIENT EDUCATION:  Education details: Exercise Technique, HEP  Person educated: Patient Education method: Explanation, Demonstration, and Handouts Education comprehension: verbalized understanding and returned demonstration   HOME EXERCISE PROGRAM:  Access Code: [email or web address] URL: [email or web address] Date: 01/01/2024 Prepared by: Lonni Phyillis Dascoli  Exercises - Seated Shoulder Flexion Towel Slide at Table Top  - 1 x daily - 7 x weekly - 3 sets - 10 reps - Seated Shoulder Abduction AAROM with Pulley at Side  - 1 x daily - 7 x weekly - 3 sets - 20 reps - Standing Shoulder Flexion AAROM with Arms Bent and Pulley Behind  - 1 x daily - 7 x weekly - 3 sets - 20 reps - Seated Single Arm Shoulder External Rotation with Towel  - 1 x daily - 3-4 x weekly - 2-3 sets - 10 reps - Supine Shoulder External and Internal Rotation in Abduction with Dumbbell  - 1 x daily - 3-4 x weekly - 2-3 sets - 10 reps - Standing Isometric Shoulder External Rotation with Doorway and Towel Roll  - 1 x daily - 3-4 x weekly - 3 sets - 10s hold - Standing Single Arm Shoulder Abduction with Dumbbell - Palm Down  - 1 x daily - 3-4 x weekly - 2-3 sets - 10 reps   Access Code: [email or web address] URL: [email or web address] Date: 12/26/2023 Prepared by: Lonni Pall  Exercises - Seated Shoulder Abduction AAROM with Pulley at Side  - 1 x daily - 7 x weekly - 3 sets - 20 reps - Standing Shoulder Flexion AAROM with Arms Bent and Pulley Behind  - 1 x daily - 7 x weekly - 3 sets - 20 reps - Seated Single Arm Shoulder External Rotation with Towel  - 1 x daily - 3-4 x weekly - 2-3 sets - 10 reps - Supine Shoulder External and Internal Rotation in Abduction with Dumbbell  - 1 x daily - 3-4 x weekly - 2-3 sets - 10 reps  ASSESSMENT:  CLINICAL  IMPRESSION: Continued PT POC focused on improving bilateral shoulder ROM and  strength. Patient continues to have pain free AROM in L shoulder flexion and abduction; notable pain with L GHJ IR when reaching for her back pocket. Good demonstration of AROM in B GHJ with ER motion while elbow is in neutral position. Notable compensation with ER against resistance at different degrees of abduction. Today's session patient able to perform [facility] functional movements simulating lifting a small weight to higher levels of shoulder flexion; no pain reported in BUE. At this time she still presents with moderate to severe weakness in bilateral shoulders, R shoulder shrug sign and limited ROM in the L shoulder. PT will provide comprehensive HEP in order for patient tontinue gradual progression of left shoulder mobility and strength while addressing compensatory patterns and endurance deficits in the right upper extremity. However, Patient requesting to discharge from shoulder POC in following appointment in order to begin lower back PT.   OBJECTIVE IMPAIRMENTS: decreased endurance, decreased mobility, decreased ROM, decreased strength, hypomobility, increased muscle spasms, and pain.   ACTIVITY LIMITATIONS: carrying, lifting, sleeping, reach over head, and hygiene/grooming  PARTICIPATION LIMITATIONS: meal prep, cleaning, and laundry  PERSONAL FACTORS: Age, Behavior pattern, Past/current experiences, Time since onset of injury/illness/exacerbation, and 1-2 comorbidities: s/p Capsulectomy, HTN are also affecting patient's functional outcome.   REHAB POTENTIAL: Fair Patient with significant weakness in R shoulder in abduction and flexion with visible shoulder shrug sign. L GHJ developing rotator cuff pathologies due to compensating for R sided weakness.   CLINICAL DECISION MAKING: Evolving/moderate complexity  EVALUATION COMPLEXITY: Moderate   GOALS: Goals reviewed with patient? Yes  SHORT TERM GOALS: Target date:  03/31/2024   Pt will be independent with HEP to improve strength and decrease shoulder pain to improve pain-free function at home and work. Baseline: Initial HEP provided Goal status: INITIAL   LONG TERM GOALS: Target date: 04/28/2024   Pt will increase R Shoulder abd to 110 without scapular elevation in order to demonstrate significant improvements in shoulder functional and overheads reaching.  Baseline: 12/27/2023: ~90; 02/25/2024: 100 visible shoulder shrug Goal status: Progressing  2.  Pt will decrease worst R shoulder pain by at least 3 points on the NPRS in order to demonstrate clinically significant reduction in shoulder pain. Baseline: 12/27/2023: 10/10 NPS; 02/25/2024: 8/10  Goal status: Progressing  3.  Pt will decrease quick DASH score by at least 8% in order to demonstrate clinically significant reduction in disability related to shoulder pain        Baseline: Deferred to next visit Goal status: INITIAL  4. Pt will increase R shoulder flex/abd, IR/ER strength by at least 1/2 MMT grade in order to demonstrate improvement in strength and function Baseline: 12/27/2023:   Flexion 3+ 3+  Extension    Abduction 3+ 4  External rotation 4- 4  Internal rotation 4- 4   Goal status: INITIAL  5.  Pt will increase L shoulder flex/abd AROM to 110 in order to demonstrate improvements in ROM and functional mobility.         Baseline: 01/21/2024: 0 due to surgical protocol; 02/25/2024: 110 Flex/95abd Goal status: Progressing     PLAN: PT FREQUENCY: 1-2x/week  PT DURATION: 8 weeks  PLANNED INTERVENTIONS: Discharge  PLAN FOR NEXT SESSION: Discharge    Lonni Pall PT, DPT Physical Therapist- [place]  03/03/2024, 11:21 AM

## 2024-03-05 ENCOUNTER — Ambulatory Visit

## 2024-03-05 DIAGNOSIS — M25512 Pain in left shoulder: Secondary | ICD-10-CM | POA: Diagnosis not present

## 2024-03-05 DIAGNOSIS — G8929 Other chronic pain: Secondary | ICD-10-CM | POA: Diagnosis not present

## 2024-03-05 DIAGNOSIS — M25612 Stiffness of left shoulder, not elsewhere classified: Secondary | ICD-10-CM | POA: Diagnosis not present

## 2024-03-05 DIAGNOSIS — M6281 Muscle weakness (generalized): Secondary | ICD-10-CM | POA: Diagnosis not present

## 2024-03-05 DIAGNOSIS — R262 Difficulty in walking, not elsewhere classified: Secondary | ICD-10-CM | POA: Diagnosis not present

## 2024-03-05 DIAGNOSIS — M5459 Other low back pain: Secondary | ICD-10-CM | POA: Diagnosis not present

## 2024-03-05 DIAGNOSIS — M25511 Pain in right shoulder: Secondary | ICD-10-CM | POA: Diagnosis not present

## 2024-03-05 NOTE — Therapy (Signed)
 OUTPATIENT PHYSICAL THERAPY SHOULDER/ELBOW TREATMENT/DISCHARGE SUMMARY  Patient Name: Krystal [name] MRN: [id number] DOB:05-Jan-1951, 73 y.o., female Today's Date: 03/05/2024  END OF SESSION:  PT End of Session - 03/05/24 0812     Visit Number 12    Number of Visits 17    Date for PT Re-Evaluation 02/21/24    PT Start Time 0813    PT Stop Time 0855    PT Time Calculation (min) 42 min    Activity Tolerance Patient tolerated treatment well    Behavior During Therapy [facility] for tasks assessed/performed          Past Medical History:  Diagnosis Date   Anemia    Arthritis    osteo   Blindness of right eye at birth   Cancer [facility]) 2011   Left breast 2011 and cervical age 36   Carpal tunnel syndrome    Cataract    Depression    Edema 07/27/2011   Fibromyalgia    muscle weakness and pain   GERD (gastroesophageal reflux disease)    Hyperlipidemia    Hypertension    benign   Lymphedema    Osteoporosis    Plantar fasciitis    Pre-diabetes    Raynaud's disease    Restless leg syndrome    Rosacea    Rotator cuff rupture    Sleep apnea    Synovitis    Ulcer    Past Surgical History:  Procedure Laterality Date   ABDOMINAL HYSTERECTOMY  1986   For bleeding and pain   ATRIAL FIBRILLATION ABLATION N/A 05/14/2023   Procedure: ATRIAL FIBRILLATION ABLATION;  Surgeon: Cindie Ole DASEN, MD;  Location: [facility];  Service: Cardiovascular;  Laterality: N/A;   BLADDER SURGERY  2006   Bladder tack   BREAST RECONSTRUCTION Bilateral 09/17/2012   Procedure: BREAST RECONSTRUCTION;  Surgeon: Estefana Reichert, DO;  Location: [facility];  Service: Plastics;  Laterality: Bilateral;  BILATERAL BREAST RECONSTRUCTION WITH TISSUE EXPANDERS AND ALLOMED   BREAST RECONSTRUCTION Right 06/03/2013   Procedure: RIGHT BREAST CAPSULE CONSTRACTURE;  Surgeon: Estefana Reichert, DO;  Location: [facility];  Service: Plastics;  Laterality: Right;   CAPSULECTOMY Right 11/18/2023    Procedure: EXCISION OF RIGHT BREAST CAPSULE CONTRACTURE AND BREAST TISSUE;  Surgeon: Lowery Estefana RAMAN, DO;  Location: [facility];  Service: Plastics;  Laterality: Right;   CARDIOVERSION N/A 08/06/2022   Procedure: CARDIOVERSION;  Surgeon: Darron Deatrice LABOR, MD;  Location: [facility];  Service: Cardiovascular;  Laterality: N/A;   CARDIOVERSION N/A 10/02/2022   Procedure: CARDIOVERSION;  Surgeon: Mady Bruckner, MD;  Location: [facility];  Service: Cardiovascular;  Laterality: N/A;   CARPAL TUNNEL RELEASE Bilateral 2008   EYE SURGERY Right    INJECTION KNEE Left 08/12/2018   Procedure: KNEE INJECTION-LEFT;  Surgeon: Edie Norleen PARAS, MD;  Location: [facility];  Service: Orthopedics;  Laterality: Left;   JOINT REPLACEMENT     LIPOSUCTION Bilateral 01/29/2013   Procedure: LIPOSUCTION;  Surgeon: Estefana Reichert, DO;  Location: [facility];  Service: Plastics;  Laterality: Bilateral;   LIPOSUCTION Right 06/03/2013   Procedure: LIPOSUCTION;  Surgeon: Estefana Reichert, DO;  Location: [facility];  Service: Plastics;  Laterality: Right;   LIPOSUCTION Right 11/18/2023   Procedure: LIPOSUCTION LATERAL RIGHT CHEST;  Surgeon: Lowery Estefana RAMAN, DO;  Location: [facility];  Service: Plastics;  Laterality: Right;   MASTECTOMY  2012   rt prophalactic mast-snbx   MASTECTOMY MODIFIED RADICAL  2012   left-axillary nodes   NEUROMA SURGERY Bilateral    NOSE SURGERY  2007   PORT-A-CATH REMOVAL     insertion and   REVERSE SHOULDER ARTHROPLASTY Left 01/16/2024   Procedure: ARTHROPLASTY, SHOULDER, TOTAL, REVERSE;  Surgeon: Melita Drivers, MD;  Location: [facility];  Service: Orthopedics;  Laterality: Left;   REVISION, RECONSTRUCTION, BREAST Right 11/18/2023   Procedure: REVISION RIGHT BREAST RECONSTRUCTION;  Surgeon: Lowery Estefana RAMAN, DO;  Location: [facility];  Service: Plastics;  Laterality: Right;  Excision of scar, capsule contracture right breast ,  possible liposuction   THROAT SURGERY     TONSILLECTOMY     TOTAL KNEE ARTHROPLASTY Right 08/12/2018   Procedure: TOTAL KNEE ARTHROPLASTY-RIGHT;  Surgeon: Edie Norleen PARAS, MD;  Location: [facility];  Service: Orthopedics;  Laterality: Right;   TOTAL KNEE ARTHROPLASTY Left 09/22/2019   Procedure: TOTAL KNEE ARTHROPLASTY;  Surgeon: Edie Norleen PARAS, MD;  Location: [facility];  Service: Orthopedics;  Laterality: Left;   UVULOPALATOPHARYNGOPLASTY     Patient Active Problem List   Diagnosis Date Noted   Raynaud's disease 11/05/2022   Osteoporosis, post-menopausal 11/05/2022   Atrial flutter (HCC) 10/02/2022   First degree AV block 09/11/2022   Arthritis 08/08/2022   Persistent atrial fibrillation (HCC) 08/06/2022   Depression 02/27/2022   Gastroesophageal reflux disease 02/27/2022   Hypertension 02/27/2022   Abnormal LFTs 02/27/2022   Alcohol  abuse 02/27/2022   Atrial fibrillation with RVR (HCC) 02/27/2022   Morbid obesity (HCC) 02/27/2022   Pes anserinus bursitis of left knee 02/08/2020   Status post total knee replacement using cement, left 09/22/2019   Status post total knee replacement using cement, right 08/12/2018   Rotator cuff rupture    Status post bilateral breast reconstruction 02/06/2013   Acquired absence of bilateral breasts and nipples 09/17/2012   Breast cancer of lower-outer quadrant of left female breast (HCC) 01/16/2011    PCP: Valora Agent, MD   REFERRING PROVIDER:  Andris Estefana BRAVO, PA-C   REFERRING DIAG:  M75.101 (ICD-10-CM) - Unspecified rotator cuff tear or rupture of right shoulder, not specified as traumatic    RATIONALE FOR EVALUATION AND TREATMENT: Rehabilitation  THERAPY DIAG: Chronic pain of both shoulders  Muscle weakness (generalized)  Stiffness of left shoulder, not elsewhere classified  ONSET DATE: 11/18/2023  FOLLOW-UP APPT SCHEDULED WITH REFERRING PROVIDER: Yes    SUBJECTIVE:                                                                                                                                                                                          SUBJECTIVE STATEMENT:    Patient with chief  concern of bilateral shoulder pain and limited ROM.   PERTINENT HISTORY:   REEVAL (01/22/2024):  Patient s/p 01/16/2024 Reverse Total Shoulder in the L arm. Patient reports 3/10 NPS in the Left Shoulder. Things have going well since the surgery. She has been doing dishes, lifting small objects, and small dog. She is adherent to her HEP exercises and utilizes ice for pain modulation. She is still active and doing activities as tolerated. Difficulty with reaching and picking up items with her RUE due to grip weakness and shoulder weakness.   Presents to OPPT with LLE in a shoulder sling.    INITIAL EVAL Patient is a 73 year old female with history of breast cancer. She most recently underwent excision of right breast capsule contracture and right breast tissue, advancement of inferior right chest and liposuction of the lateral right chest with Dr. Lowery on 11/18/2023. She she denies any fevers or chills.  No stitches or soreness along the R breast tissue/shoulder. She denies any drainage. She also reports that she has been having some difficulties with range of motion of her right upper extremity.   Pt reports that she has difficulties with lifting a gallon of milk to a top shelf with her RUE; she has to use her L arm to assist her R arm. She reports pain in both arms but R > L. Her L arm is starting to develop pain due to compensating for the R arm. Denies n/t.   She has lifting restrictions, no greater than 10 pounds for 6 weeks postoperatively. (12/30/2023)   Dominant hand: right   PAIN:  Pain Intensity: Present: 1/10, Best: 3/10, Worst: 10/10 Pain location: Bilateral Shoulder  Pain Quality: constant, sharp, and aching  Radiating: Yes  Numbness/Tingling: No Focal Weakness: Yes Aggravating factors: Repetitive Overhead  Movements, Laying on R shoulder,  Relieving factors: Ice Modalities 24-hour pain behavior: Activity Dependent History of prior shoulder or neck/shoulder injury, pain, surgery, or therapy: Yes Imaging: Yes  Red flags (personal history of cancer, chills/fever, night sweats, nausea, vomiting, unrelenting pain, unexplained weight gain/loss): Negative  PRECAUTIONS: None  WEIGHT BEARING RESTRICTIONS: Yes She has lifting restrictions, no greater than 10 pounds for 6 weeks postoperatively.  FALLS: Has patient fallen in last 6 months? No  Living Environment Lives with: lives with their family Lives in: House/apartment Stairs: Yes: Internal: 13 steps; on right going up and External: 5 steps; can reach both Has following equipment at home: None  Prior level of function: Independent  Occupational demands: Retired   Hobbies: Dog (45 yr old)  Patient Goals: Movement and less pain    OBJECTIVE:   Patient Surveys  FOTO: QuickDASH:  Cognition Patient is oriented to person, place, and time.  Recent memory is intact.  Remote memory is intact.  Attention span and concentration are intact.  Expressive speech is intact.  Patient's fund of knowledge is within normal limits for educational level.    Gross Musculoskeletal Assessment Tremor: None Bulk: Normal Tone: Normal   Posture Seated: Forward, Rounded Shoulder, increase thoracic kyphosis  Cervical Screen AROM: WFL and painless with overpressure in all planes Spurlings A (ipsilateral lateral flexion/axial compression): R: Negative L: Not examined Spurlings B (ipsilateral lateral flexion/contralateral rotation/axial compression): R: Not examined L: Not examined Repeated movement:Deferred a   AROM AROM (Normal range in degrees) AROM/PROM  Cervical  Flexion (50) WNL  Extension (80) WNL  Right lateral flexion (45) WNL  Left lateral flexion (45) WNL  Right rotation (85) WNL  Left rotation (85) WNL  Right Left (01/22/24)   Shoulder    Flexion 180 Deferred/150  Extension    Abduction 90/180 Deferred/90  External Rotation 90   Internal Rotation 70/85 Deferred/50  Hands Behind Head WNL WNL  Hands Behind Back WNL Surgical precautions      Elbow    Flexion WNL WNL  Extension WNL WNL  Pronation WNL WNL  Supination WNL WNl  (* = pain; Blank rows = not tested)  UE MMT: MMT (out of 5) Right Left   Cervical (isometric)  Flexion WNL  Extension WNL  Lateral Flexion WNL WNL  Rotation WNL WNL      Shoulder   Flexion 3+ 3+  Extension    Abduction 3+ 4  External rotation 4- 4  Internal rotation 4- 4  Horizontal abduction    Horizontal adduction    Lower Trapezius    Rhomboids        Elbow  Flexion 5 5  Extension 5 5  Pronation 5 5  Supination 5 5      Wrist  Flexion    Extension    Radial deviation    Ulnar deviation        MCP  Flexion    Extension    Abduction    Adduction    (* = pain; Blank rows = not tested)  Sensation Grossly intact to light touch bilateral UE as determined by testing dermatomes C2-T2. Proprioception and hot/cold testing deferred on this date.  Reflexes Deferred   Palpation Location LEFT  RIGHT           Subocciptials    Cervical paraspinals 1 1  Upper Trapezius 1 1  Levator Scapulae    Rhomboid Major/Minor    Sternoclavicular joint    Acromioclavicular joint    Coracoid process    Long head of biceps    Supraspinatus 1 1  Infraspinatus 2 2  Subscapularis 1 1  Teres Minor    Teres Major    Pectoralis Major 1 1  Pectoralis Minor 1 1  Anterior Deltoid    Lateral Deltoid 1 1  Posterior Deltoid 1 1  Latissimus Dorsi    Sternocleidomastoid    (Blank rows = not tested) Graded on 0-4 scale (0 = no pain, 1 = pain, 2 = pain with wincing/grimacing/flinching, 3 = pain with withdrawal, 4 = unwilling to allow palpation), (Blank rows = not tested)   Passive Accessory Intervertebral Motion Deferred  Accessory  Motions/Glides Glenohumeral: Posterior: R: normal L: abnormal Inferior: R: normal L: abnormal Anterior: R: normal L: not examined   SPECIAL TESTS Rotator Cuff  Drop Arm Test: Negative Painful Arc (Pain from 60 to 120 degrees scaption): Positive Infraspinatus Muscle Test: Positive If all 3 tests positive, the probability of a full-thickness rotator cuff tear is 91%   Bicep Tendon Pathology Speed (shoulder flexion to 90, external rotation, full elbow extension, and forearm supination with resistance: Negative Yergason's (resisted shoulder ER and supination/biceps tendon pathology): Negative  TODAY'S TREATMENT: DATE: 03/05/24  Subjective: Patient reports to OPPT after f/u with referring provider. PA advised patient to discontinue shoulder PT in order address lower back pain. Patient agreeable to discharge from shoulder PT in today's. PA also advised patient she may readdress shoulder PT in 2 mos per Dr. Melita. No further questions or concerns.   Therapeutic Exercise:    Reviewed HEP with return demonstration:     Supine Shoulder Internal and External Rotation (90 abd) - Supported by [employer]    R/L:  2 x 10        Seated External Rotation with Scapular Retraction     R/L: 2 x 10    Therapeutic Activity:   UBE - 5 min - 2.5 min Fwd, 2.5 min Retro - Level 12-8 for UE Warm Up, strength and muscular endurance; PT manually adjusted resistance throughout to patient's tolerance.   Seated Pully AAROM for upward reaching  L: 1 x 20 - Shoulder Abduction  L: 1 x 20 - Shoulder Flexion    Standing AAROM for upward, outward reaching  BUE: Shoulder flexion 1 x 10 - [place]   BUE: Shoulder Abduction 1    Standing Shoulder Flexion against resistance for increased capacity lifting, carrying    R/L: 2 x 10 - 2# DB    Standing Shoulder Abd agaisnt resistance for increased lifting, carrying capacity   R/L: 2 x 10 - 2# DB  PATIENT EDUCATION:  Education details: Exercise Technique, HEP   Person educated: Patient Education method: Explanation, Demonstration, and Handouts Education comprehension: verbalized understanding and returned demonstration   HOME EXERCISE PROGRAM:  Access Code: [email or web address] URL: [email or web address] Date: 01/01/2024 Prepared by: Lonni Amol Domanski  Exercises - Seated Shoulder Flexion Towel Slide at Table Top  - 1 x daily - 7 x weekly - 3 sets - 10 reps - Seated Shoulder Abduction AAROM with Pulley at Side  - 1 x daily - 7 x weekly - 3 sets - 20 reps - Standing Shoulder Flexion AAROM with Arms Bent and Pulley Behind  - 1 x daily - 7 x weekly - 3 sets - 20 reps - Seated Single Arm Shoulder External Rotation with Towel  - 1 x daily - 3-4 x weekly - 2-3 sets - 10 reps - Supine Shoulder External and Internal Rotation in Abduction with Dumbbell  - 1 x daily - 3-4 x weekly - 2-3 sets - 10 reps - Standing Isometric Shoulder External Rotation with Doorway and Towel Roll  - 1 x daily - 3-4 x weekly - 3 sets - 10s hold - Standing Single Arm Shoulder Abduction with Dumbbell - Palm Down  - 1 x daily - 3-4 x weekly - 2-3 sets - 10 reps   Access Code: [email or web address] URL: [email or web address] Date: 12/26/2023 Prepared by: Lonni Pall  Exercises - Seated Shoulder Abduction AAROM with Pulley at Side  - 1 x daily - 7 x weekly - 3 sets - 20 reps - Standing Shoulder Flexion AAROM with Arms Bent and Pulley Behind  - 1 x daily - 7 x weekly - 3 sets - 20 reps - Seated Single Arm Shoulder External Rotation with Towel  - 1 x daily - 3-4 x weekly - 2-3 sets - 10 reps - Supine Shoulder External and Internal Rotation in Abduction with Dumbbell  - 1 x daily - 3-4 x weekly - 2-3 sets - 10 reps  ASSESSMENT:  CLINICAL IMPRESSION:  Patient presents to OPPT to discharge from current POC in order to address lower back pain. Dorreen Valiente is a 73 y.o. female/female seen for bilateral shoulder rotator cuff tendinitis and s/p L reverse total shoulder  replacement. Pt is progressing towards her PT goals and is agreeable to discharge to home with HEP. She has spoke with referring provider regarding ending PT for the shoulder early based on her current functional status. She is able to demonstrate L shoulder abd/flex: 105/110 AROM respectively. She has also increased bilateral shoulder strength since the start of her POC (See MMT goals below) . PT  strongly encouraged patient to avoid over exhaustion for both shoulders. Additional encouragement to adhere to HEP in order to maintain current ROM in both shoulders. Patient agreeable to end POC at end of session and without questions/concerns. Follow-up with their primary care provider is recommended if any issues arise.    OBJECTIVE IMPAIRMENTS: decreased endurance, decreased mobility, decreased ROM, decreased strength, hypomobility, increased muscle spasms, and pain.   ACTIVITY LIMITATIONS: carrying, lifting, sleeping, reach over head, and hygiene/grooming  PARTICIPATION LIMITATIONS: meal prep, cleaning, and laundry  PERSONAL FACTORS: Age, Behavior pattern, Past/current experiences, Time since onset of injury/illness/exacerbation, and 1-2 comorbidities: s/p Capsulectomy, HTN are also affecting patient's functional outcome.   REHAB POTENTIAL: Fair Patient with significant weakness in R shoulder in abduction and flexion with visible shoulder shrug sign. L GHJ developing rotator cuff pathologies due to compensating for R sided weakness.   CLINICAL DECISION MAKING: Evolving/moderate complexity  EVALUATION COMPLEXITY: Moderate   GOALS: Goals reviewed with patient? Yes  SHORT TERM GOALS: Target date: 04/02/2024   Pt will be independent with HEP to improve strength and decrease shoulder pain to improve pain-free function at home and work. Baseline: Initial HEP provided Goal status: INITIAL   LONG TERM GOALS: Target date: 04/30/2024   Pt will increase R Shoulder abd to 110 without scapular  elevation in order to demonstrate significant improvements in shoulder functional and overheads reaching.  Baseline: 12/27/2023: ~90; 02/25/2024: 100 visible shoulder shrug; 03/05/2024: 105 (Shrug Sign with further ranges) Goal status: Progressing  2.  Pt will decrease worst R shoulder pain by at least 3 points on the NPRS in order to demonstrate clinically significant reduction in shoulder pain. Baseline: 12/27/2023: 10/10 NPS; 02/25/2024: 8/10; 03/05/2024: 08/10 NPS Goal status: Progressing  3.  Pt will decrease quick DASH score by at least 8% in order to demonstrate clinically significant reduction in disability related to shoulder pain        Baseline: Deferred to next visit Goal status: INITIAL  4. Pt will increase R shoulder flex/abd, IR/ER strength by at least 1/2 MMT grade in order to demonstrate improvement in strength and function Baseline: 12/27/2023:      R/L 03/05/24  Flexion 3+ 3+ 4-/4-  Extension     Abduction 3+ 4 4-/4-  External rotation 4- 4 4-/3+  Internal rotation 4- 4 4-/3+   Goal status: Progressing   5.  Pt will increase L shoulder flex/abd AROM to 110 in order to demonstrate improvements in ROM and functional mobility.         Baseline: 01/21/2024: 0 due to surgical protocol; 02/25/2024: 110 Flex/95abd; 03/05/2024: 115/100 Goal status: Progressing     PLAN: PT FREQUENCY: 1-2x/week  PT DURATION: 8 weeks  PLANNED INTERVENTIONS: Discharge  PLAN FOR NEXT SESSION: Discharge    Lonni Pall PT, DPT Physical Therapist- [place]  03/05/2024, 8:15 AM

## 2024-03-09 ENCOUNTER — Ambulatory Visit

## 2024-03-09 DIAGNOSIS — R262 Difficulty in walking, not elsewhere classified: Secondary | ICD-10-CM | POA: Diagnosis not present

## 2024-03-09 DIAGNOSIS — M25511 Pain in right shoulder: Secondary | ICD-10-CM | POA: Diagnosis not present

## 2024-03-09 DIAGNOSIS — M5459 Other low back pain: Secondary | ICD-10-CM | POA: Diagnosis not present

## 2024-03-09 DIAGNOSIS — G8929 Other chronic pain: Secondary | ICD-10-CM | POA: Diagnosis not present

## 2024-03-09 DIAGNOSIS — M6281 Muscle weakness (generalized): Secondary | ICD-10-CM

## 2024-03-09 DIAGNOSIS — M25612 Stiffness of left shoulder, not elsewhere classified: Secondary | ICD-10-CM | POA: Diagnosis not present

## 2024-03-09 DIAGNOSIS — M25512 Pain in left shoulder: Secondary | ICD-10-CM | POA: Diagnosis not present

## 2024-03-09 NOTE — Therapy (Signed)
 OUTPATIENT PHYSICAL THERAPY THORACOLUMBAR EVALUATION/TREATMENT   Patient Name: Krystal [name] MRN: [id number] DOB:09-03-1950, 73 y.o., female Today's Date: 03/09/2024  END OF SESSION:  PT End of Session - 03/09/24 1423     Visit Number 1    Number of Visits 17    Date for PT Re-Evaluation 05/04/24    PT Start Time 1353    PT Stop Time 1430    PT Time Calculation (min) 37 min    Activity Tolerance Patient tolerated treatment well    Behavior During Therapy [facility] for tasks assessed/performed          Past Medical History:  Diagnosis Date   Anemia    Arthritis    osteo   Blindness of right eye at birth   Cancer [facility]) 2011   Left breast 2011 and cervical age 70   Carpal tunnel syndrome    Cataract    Depression    Edema 07/27/2011   Fibromyalgia    muscle weakness and pain   GERD (gastroesophageal reflux disease)    Hyperlipidemia    Hypertension    benign   Lymphedema    Osteoporosis    Plantar fasciitis    Pre-diabetes    Raynaud's disease    Restless leg syndrome    Rosacea    Rotator cuff rupture    Sleep apnea    Synovitis    Ulcer    Past Surgical History:  Procedure Laterality Date   ABDOMINAL HYSTERECTOMY  1986   For bleeding and pain   ATRIAL FIBRILLATION ABLATION N/A 05/14/2023   Procedure: ATRIAL FIBRILLATION ABLATION;  Surgeon: Cindie Ole DASEN, MD;  Location: [facility];  Service: Cardiovascular;  Laterality: N/A;   BLADDER SURGERY  2006   Bladder tack   BREAST RECONSTRUCTION Bilateral 09/17/2012   Procedure: BREAST RECONSTRUCTION;  Surgeon: Estefana Reichert, DO;  Location: [facility];  Service: Plastics;  Laterality: Bilateral;  BILATERAL BREAST RECONSTRUCTION WITH TISSUE EXPANDERS AND ALLOMED   BREAST RECONSTRUCTION Right 06/03/2013   Procedure: RIGHT BREAST CAPSULE CONSTRACTURE;  Surgeon: Estefana Reichert, DO;  Location: [facility];  Service: Plastics;  Laterality: Right;   CAPSULECTOMY Right 11/18/2023    Procedure: EXCISION OF RIGHT BREAST CAPSULE CONTRACTURE AND BREAST TISSUE;  Surgeon: Lowery Estefana RAMAN, DO;  Location: [facility];  Service: Plastics;  Laterality: Right;   CARDIOVERSION N/A 08/06/2022   Procedure: CARDIOVERSION;  Surgeon: Darron Deatrice LABOR, MD;  Location: [facility];  Service: Cardiovascular;  Laterality: N/A;   CARDIOVERSION N/A 10/02/2022   Procedure: CARDIOVERSION;  Surgeon: Mady Bruckner, MD;  Location: [facility];  Service: Cardiovascular;  Laterality: N/A;   CARPAL TUNNEL RELEASE Bilateral 2008   EYE SURGERY Right    INJECTION KNEE Left 08/12/2018   Procedure: KNEE INJECTION-LEFT;  Surgeon: Edie Norleen PARAS, MD;  Location: [facility];  Service: Orthopedics;  Laterality: Left;   JOINT REPLACEMENT     LIPOSUCTION Bilateral 01/29/2013   Procedure: LIPOSUCTION;  Surgeon: Estefana Reichert, DO;  Location: [facility];  Service: Plastics;  Laterality: Bilateral;   LIPOSUCTION Right 06/03/2013   Procedure: LIPOSUCTION;  Surgeon: Estefana Reichert, DO;  Location: [facility];  Service: Plastics;  Laterality: Right;   LIPOSUCTION Right 11/18/2023   Procedure: LIPOSUCTION LATERAL RIGHT CHEST;  Surgeon: Lowery Estefana RAMAN, DO;  Location: [facility];  Service: Plastics;  Laterality: Right;   MASTECTOMY  2012   rt prophalactic mast-snbx   MASTECTOMY MODIFIED RADICAL  2012   left-axillary nodes   NEUROMA SURGERY Bilateral    NOSE SURGERY  2007   PORT-A-CATH REMOVAL     insertion and   REVERSE SHOULDER ARTHROPLASTY Left 01/16/2024   Procedure: ARTHROPLASTY, SHOULDER, TOTAL, REVERSE;  Surgeon: Melita Drivers, MD;  Location: [facility];  Service: Orthopedics;  Laterality: Left;   REVISION, RECONSTRUCTION, BREAST Right 11/18/2023   Procedure: REVISION RIGHT BREAST RECONSTRUCTION;  Surgeon: Lowery Estefana RAMAN, DO;  Location: [facility];  Service: Plastics;  Laterality: Right;  Excision of scar, capsule contracture right breast ,  possible liposuction   THROAT SURGERY     TONSILLECTOMY     TOTAL KNEE ARTHROPLASTY Right 08/12/2018   Procedure: TOTAL KNEE ARTHROPLASTY-RIGHT;  Surgeon: Edie Norleen PARAS, MD;  Location: [facility];  Service: Orthopedics;  Laterality: Right;   TOTAL KNEE ARTHROPLASTY Left 09/22/2019   Procedure: TOTAL KNEE ARTHROPLASTY;  Surgeon: Edie Norleen PARAS, MD;  Location: [facility];  Service: Orthopedics;  Laterality: Left;   UVULOPALATOPHARYNGOPLASTY     Patient Active Problem List   Diagnosis Date Noted   Raynaud's disease 11/05/2022   Osteoporosis, post-menopausal 11/05/2022   Atrial flutter (HCC) 10/02/2022   First degree AV block 09/11/2022   Arthritis 08/08/2022   Persistent atrial fibrillation (HCC) 08/06/2022   Depression 02/27/2022   Gastroesophageal reflux disease 02/27/2022   Hypertension 02/27/2022   Abnormal LFTs 02/27/2022   Alcohol  abuse 02/27/2022   Atrial fibrillation with RVR (HCC) 02/27/2022   Morbid obesity (HCC) 02/27/2022   Pes anserinus bursitis of left knee 02/08/2020   Status post total knee replacement using cement, left 09/22/2019   Status post total knee replacement using cement, right 08/12/2018   Rotator cuff rupture    Status post bilateral breast reconstruction 02/06/2013   Acquired absence of bilateral breasts and nipples 09/17/2012   Breast cancer of lower-outer quadrant of left female breast (HCC) 01/16/2011    PCP: Valora Agent, MD  REFERRING PROVIDER: Garland Sora, PA-C  REFERRING DIAG:  M54.16 (ICD-10-CM) - Radiculopathy, lumbar region   RATIONALE FOR EVALUATION AND TREATMENT: Rehabilitation  THERAPY DIAG: Other low back pain  Muscle weakness (generalized)  Difficulty in walking, not elsewhere classified  ONSET DATE: Chronic  FOLLOW-UP APPT SCHEDULED WITH REFERRING PROVIDER: Yes    SUBJECTIVE:                                                                                                                                                                                          SUBJECTIVE STATEMENT:    Patient reporting to OPPT with a chief concern of lower back pain.   PERTINENT HISTORY:   Krystal [name]  is a 73 y.o. female presenting to OPPT with chronic lower back pain. She is familiar to this clinic and has recently d/c from PT for reverse total shoulder replacement (L).  Patient reports that 8-9 mos ago she thought sciatic pain however she realized the pain was originating from the back. Patient reports death in family (father) 1 yr ago; she reports that she was the primary caregiver for two years. She reports that her lower back pain radiates into both of her legs (R > L); pain stops at the knees.   Aggravating factors: Prolonged sitting in a straight chair; reaching for weeds;  Relieving factors: Leaning forward, bending forward; tylenol    She denies saddle paraesthesia, changes in bowel/bladder, acute trauma to lumbar spine.    Imaging: (per chart review 03/09/2024):  Two-view lumbar spine X-ray, PA and lateral, performed in the office today and personally reviewed. The patient does not have any significant scoliosis. Minimal arthritis is noted in the hip joints, with no arthritis in the sacroiliac joints. The patient has five non-rib-bearing lumbar vertebrae. Grade 1 spondylolisthesis is observed at L3-4 and L4-5. No compression fractures are noted.    PAIN:    Pain Intensity: Present: 6/10, Best: 5/10, Worst: 8/10 Pain location: R Lower back with intermittent L involvement  Pain Quality: constant, burning, and real hot  Radiating: Yes  Numbness/Tingling: No  PRECAUTIONS: None  WEIGHT BEARING RESTRICTIONS: No  FALLS: Has patient fallen in last 6 months? No  Living Environment Lives with: lives with their spouse Lives in: House/apartment Stairs: Yes: Internal: 13 steps; on right going up Has following equipment at home: None  Prior level of function: Independent  Occupational demands: Retired  Hobbies: 63 yr  old dog; Cleaning  Patient Goals: I would like to stand up straight without pain    OBJECTIVE:  Patient Surveys  mODI: 15/50 - 30%  Cognition WNL    Gross Musculoskeletal Assessment Tremor: None Bulk: Normal Tone: Normal No visible step-off along spinal column, no signs of scoliosis  GAIT: Distance walked: 69m  Assistive device utilized: None Level of assistance: Complete Independence Comments: Forward Trunk Lean  Posture: Lumbar lordosis: WNL Iliac crest height: Equal bilaterally Lumbar lateral shift: Negative  AROM AROM (Normal range in degrees) AROM   Lumbar   Flexion (65) 100%  Extension (30) 100%  Right lateral flexion (25) 100%   Left lateral flexion (25) 100%  Right rotation (30) 100%   Left rotation (30) 100%       Hip Right Left  Flexion (125) 110 110  Extension (15)    Abduction (40)    Adduction     Internal Rotation (45) 25 35  External Rotation (45) 30 30      Knee    Flexion (135) WNL WNL  Extension (0) WNL WNL      Ankle    Dorsiflexion (20) WNL WNL  Plantarflexion (50)    Inversion (35)    Eversion (15)    (* = pain; Blank rows = not tested)  LE MMT: MMT (out of 5) Right  Left   Hip flexion 4- 4-  Hip extension    Hip abduction 4- 4-  Hip adduction    Hip internal rotation 4+ 4+  Hip external rotation 4+ 4+  Knee flexion 5 5  Knee extension 5 5  Ankle dorsiflexion 5 5  Ankle plantarflexion 5 5  Ankle inversion    Ankle eversion    (* = pain; Blank rows = not  tested)  Sensation Grossly intact to light touch throughout bilateral LEs as determined by testing dermatomes L2-S2. Proprioception, stereognosis, and hot/cold testing deferred on this date.  Reflexes Deferred   Muscle Length Hamstrings: R: Negative L: Negative Thomas (hip flexors): R: Not examined L: Not examined  Palpation Location Right Left         Lumbar paraspinals 2 1  Quadratus Lumborum    Gluteus Maximus 2 1  Gluteus Medius    Deep hip  external rotators 0 0  PSIS 0 0  Fortin's Area (SIJ)    Greater Trochanter 0 0  (Blank rows = not tested) Graded on 0-4 scale (0 = no pain, 1 = pain, 2 = pain with wincing/grimacing/flinching, 3 = pain with withdrawal, 4 = unwilling to allow palpation)  Passive Accessory  Motion Pt denies reproduction of back pain with CPA L1-L5 and UPA bilaterally L1-L5. Generally, hypomobile throughout  Special Tests Lumbar Radiculopathy and Discogenic: Centralization and Peripheralization (SN 92, -LR 0.12): Not examined Slump (SN 83, -LR 0.32): R: Not examined L: Not examined SLR (SN 92, -LR 0.29): R: Negative L:  Negative Crossed SLR (SP 90): R: Negative L: Negative  Facet Joint: Extension-Rotation (SN 100, -LR 0.0): R: Positive L: Not examined  Lumbar Foraminal Stenosis: Lumbar quadrant (SN 70): R: Positive L: Not examined  Hip: FABER (SN 81): R: Not examined L: Not examined FADIR (SN 94): R: Not examined L: Not examined Hip scour (SN 50): R: Not examined L: Not examined  TODAY'S TREATMENT: DATE: 03/09/24   Reviewed HEP with pt return demonstration;  Exercises - Supine Bilateral Hip Internal Rotation Stretch  - 1 x daily - 7 x weekly - 2-3 sets - 30s hold - Supine Lower Trunk Rotation  - 1 x daily - 7 x weekly - 2-3 sets - 10 reps - Supine Bridge  - 1 x daily - 3-4 x weekly - 2-3 sets - 10-12 reps - Supine Double Bent Leg Lift  - 1 x daily - 3-4 x weekly - 2-3 sets - 10-12 reps   PATIENT EDUCATION:  Education details: HEP, Prognosis, Exercises  Person educated: Patient Education method: Explanation, Demonstration, and Handouts Education comprehension: verbalized understanding and returned demonstration   HOME EXERCISE PROGRAM:  Access Code: [email or web address] URL: [email or web address] Date: 03/09/2024 Prepared by: Lonni Verneda Hollopeter  Exercises - Supine Bilateral Hip Internal Rotation Stretch  - 1 x daily - 7 x weekly - 2-3 sets - 30s hold - Supine Lower Trunk Rotation  - 1  x daily - 7 x weekly - 2-3 sets - 10 reps - Supine Bridge  - 1 x daily - 3-4 x weekly - 2-3 sets - 10-12 reps - Supine Double Bent Leg Lift  - 1 x daily - 3-4 x weekly - 2-3 sets - 10-12 reps   ASSESSMENT:  CLINICAL IMPRESSION: Patient is a 73 y.o. female who was seen today for physical therapy evaluation and treatment for chronic low back pain. Patient presenting with decreased activity tolerance and extension based lower back pain. Pain is relieved with flexion based movements. Gross ROM (knee, ankle) within functional limits with some deficits in the hip specifically in internal rotation (R < L). Strength testing mostly normal with minor deficits in the hip (abduction, flexion). Observation of gait significant for forward trunk lean with prolonged walking; 1st lap patient attempted to walk with lumbar extension but exacerbated pain, proceeded to present with forward trunk lean in 2nd lap. Palpation significant for tenderness in the gluteal maximus  muscle, lumbar paraspinals and deep gluteals (R > L). Special Testing negative for SLR and crossed SLR. Signs and symptoms consistent with suspected spinal stenosis. Initial HEP provided targeted with core stabilization in flexion based positions and hip strengthening. Good return demonstration of all interventions without exacerbation of pain in the lumbar region. She reported that she would like to trial PT for her lumbar spine before consulting referring physician about further imaging.  Based on today's performance, pt will continue to benefit from skilled PT in order to facilitate return to PLOF and improve QoL.    OBJECTIVE IMPAIRMENTS: decreased activity tolerance, decreased ROM, decreased strength, and pain.   ACTIVITY LIMITATIONS: lifting, bending, standing, squatting, transfers, and caring for others  PARTICIPATION LIMITATIONS: driving and yard work  PERSONAL FACTORS: Age, Behavior pattern, Past/current experiences, Time since onset of  injury/illness/exacerbation, and 1-2 comorbidities: s/p L Reverse TSR, s/p bilateral TKR  are also affecting patient's functional outcome.   REHAB POTENTIAL: Fair Chronicity  CLINICAL DECISION MAKING: Evolving/moderate complexity  EVALUATION COMPLEXITY: Moderate   GOALS: Goals reviewed with patient? No  SHORT TERM GOALS: Target date: 04/06/2024  Pt will be independent with HEP in order to improve strength and decrease back pain to improve pain-free function at home and work. Baseline: 03/09/2024: initial HEP Goal status: INITIAL   LONG TERM GOALS: Target date: 05/04/2024  Pt will increase by at least 1.1 m/s in order to demonstrate clinically significant improvement in community ambulation. Baseline: 03/09/2024: .97 m/s Goal status: INITIAL  2.  Pt will decrease worst back pain by at least 2 points on the NPRS in order to demonstrate clinically significant reduction in back pain. Baseline: 03/09/2024: 10/10 NPS Goal status: INITIAL  3.  Pt will decrease mODI score by at least 13 points in order demonstrate clinically significant reduction in back pain/disability.       Baseline: 03/09/2024: 15/50 -30% Goal status: INITIAL  4.  Pt will increase R Hip IR to 35 in order to demonstrate significant improvements in hip mobility and limb symmetry.  Baseline: 03/09/2024: R IR: 25 Goal status: INITIAL   PLAN:  PT FREQUENCY: 1-2x/week  PT DURATION: 8 weeks  PLANNED INTERVENTIONS: [id number]- PT Re-evaluation, 97110-Therapeutic exercises, 97530- Therapeutic activity, 97112- Neuromuscular re-education, 97535- Self Care, [id number]- Manual therapy, Joint mobilization, Cryotherapy, and Moist heat  PLAN FOR NEXT SESSION: Review HEP; initiate core stabilization, flexion-based exercises, supine core, hip strengthening   Lonni Pall PT, DPT Physical Therapist- [place]  03/09/2024, 2:57 PM

## 2024-03-10 ENCOUNTER — Ambulatory Visit

## 2024-03-10 ENCOUNTER — Encounter

## 2024-03-10 DIAGNOSIS — G8929 Other chronic pain: Secondary | ICD-10-CM | POA: Diagnosis not present

## 2024-03-10 DIAGNOSIS — M25511 Pain in right shoulder: Secondary | ICD-10-CM | POA: Diagnosis not present

## 2024-03-10 DIAGNOSIS — M5459 Other low back pain: Secondary | ICD-10-CM | POA: Diagnosis not present

## 2024-03-10 DIAGNOSIS — M6281 Muscle weakness (generalized): Secondary | ICD-10-CM

## 2024-03-10 DIAGNOSIS — R262 Difficulty in walking, not elsewhere classified: Secondary | ICD-10-CM | POA: Diagnosis not present

## 2024-03-10 DIAGNOSIS — M25612 Stiffness of left shoulder, not elsewhere classified: Secondary | ICD-10-CM | POA: Diagnosis not present

## 2024-03-10 DIAGNOSIS — M25512 Pain in left shoulder: Secondary | ICD-10-CM | POA: Diagnosis not present

## 2024-03-10 NOTE — Therapy (Signed)
 OUTPATIENT PHYSICAL THERAPY THORACOLUMBAR EVALUATION/TREATMENT   Patient Name: Krystal [name] MRN: [id number] DOB:09/24/1950, 73 y.o., female Today's Date: 03/10/2024  END OF SESSION:  PT End of Session - 03/10/24 1119     Visit Number 2    Number of Visits 17    Date for PT Re-Evaluation 05/04/24    PT Start Time 1117    PT Stop Time 1157    PT Time Calculation (min) 40 min    Activity Tolerance Patient tolerated treatment well    Behavior During Therapy [facility] for tasks assessed/performed          Past Medical History:  Diagnosis Date   Anemia    Arthritis    osteo   Blindness of right eye at birth   Cancer [facility]) 2011   Left breast 2011 and cervical age 59   Carpal tunnel syndrome    Cataract    Depression    Edema 07/27/2011   Fibromyalgia    muscle weakness and pain   GERD (gastroesophageal reflux disease)    Hyperlipidemia    Hypertension    benign   Lymphedema    Osteoporosis    Plantar fasciitis    Pre-diabetes    Raynaud's disease    Restless leg syndrome    Rosacea    Rotator cuff rupture    Sleep apnea    Synovitis    Ulcer    Past Surgical History:  Procedure Laterality Date   ABDOMINAL HYSTERECTOMY  1986   For bleeding and pain   ATRIAL FIBRILLATION ABLATION N/A 05/14/2023   Procedure: ATRIAL FIBRILLATION ABLATION;  Surgeon: Cindie Ole DASEN, MD;  Location: [facility];  Service: Cardiovascular;  Laterality: N/A;   BLADDER SURGERY  2006   Bladder tack   BREAST RECONSTRUCTION Bilateral 09/17/2012   Procedure: BREAST RECONSTRUCTION;  Surgeon: Estefana Reichert, DO;  Location: [facility];  Service: Plastics;  Laterality: Bilateral;  BILATERAL BREAST RECONSTRUCTION WITH TISSUE EXPANDERS AND ALLOMED   BREAST RECONSTRUCTION Right 06/03/2013   Procedure: RIGHT BREAST CAPSULE CONSTRACTURE;  Surgeon: Estefana Reichert, DO;  Location: [facility];  Service: Plastics;  Laterality: Right;   CAPSULECTOMY Right 11/18/2023    Procedure: EXCISION OF RIGHT BREAST CAPSULE CONTRACTURE AND BREAST TISSUE;  Surgeon: Lowery Estefana RAMAN, DO;  Location: [facility];  Service: Plastics;  Laterality: Right;   CARDIOVERSION N/A 08/06/2022   Procedure: CARDIOVERSION;  Surgeon: Darron Deatrice LABOR, MD;  Location: [facility];  Service: Cardiovascular;  Laterality: N/A;   CARDIOVERSION N/A 10/02/2022   Procedure: CARDIOVERSION;  Surgeon: Mady Bruckner, MD;  Location: [facility];  Service: Cardiovascular;  Laterality: N/A;   CARPAL TUNNEL RELEASE Bilateral 2008   EYE SURGERY Right    INJECTION KNEE Left 08/12/2018   Procedure: KNEE INJECTION-LEFT;  Surgeon: Edie Norleen PARAS, MD;  Location: [facility];  Service: Orthopedics;  Laterality: Left;   JOINT REPLACEMENT     LIPOSUCTION Bilateral 01/29/2013   Procedure: LIPOSUCTION;  Surgeon: Estefana Reichert, DO;  Location: [facility];  Service: Plastics;  Laterality: Bilateral;   LIPOSUCTION Right 06/03/2013   Procedure: LIPOSUCTION;  Surgeon: Estefana Reichert, DO;  Location: [facility];  Service: Plastics;  Laterality: Right;   LIPOSUCTION Right 11/18/2023   Procedure: LIPOSUCTION LATERAL RIGHT CHEST;  Surgeon: Lowery Estefana RAMAN, DO;  Location: [facility];  Service: Plastics;  Laterality: Right;   MASTECTOMY  2012   rt prophalactic mast-snbx   MASTECTOMY MODIFIED RADICAL  2012   left-axillary nodes   NEUROMA SURGERY Bilateral    NOSE SURGERY  2007   PORT-A-CATH REMOVAL     insertion and   REVERSE SHOULDER ARTHROPLASTY Left 01/16/2024   Procedure: ARTHROPLASTY, SHOULDER, TOTAL, REVERSE;  Surgeon: Melita Drivers, MD;  Location: [facility];  Service: Orthopedics;  Laterality: Left;   REVISION, RECONSTRUCTION, BREAST Right 11/18/2023   Procedure: REVISION RIGHT BREAST RECONSTRUCTION;  Surgeon: Lowery Estefana RAMAN, DO;  Location: [facility];  Service: Plastics;  Laterality: Right;  Excision of scar, capsule contracture right breast ,  possible liposuction   THROAT SURGERY     TONSILLECTOMY     TOTAL KNEE ARTHROPLASTY Right 08/12/2018   Procedure: TOTAL KNEE ARTHROPLASTY-RIGHT;  Surgeon: Edie Norleen PARAS, MD;  Location: [facility];  Service: Orthopedics;  Laterality: Right;   TOTAL KNEE ARTHROPLASTY Left 09/22/2019   Procedure: TOTAL KNEE ARTHROPLASTY;  Surgeon: Edie Norleen PARAS, MD;  Location: [facility];  Service: Orthopedics;  Laterality: Left;   UVULOPALATOPHARYNGOPLASTY     Patient Active Problem List   Diagnosis Date Noted   Raynaud's disease 11/05/2022   Osteoporosis, post-menopausal 11/05/2022   Atrial flutter (HCC) 10/02/2022   First degree AV block 09/11/2022   Arthritis 08/08/2022   Persistent atrial fibrillation (HCC) 08/06/2022   Depression 02/27/2022   Gastroesophageal reflux disease 02/27/2022   Hypertension 02/27/2022   Abnormal LFTs 02/27/2022   Alcohol  abuse 02/27/2022   Atrial fibrillation with RVR (HCC) 02/27/2022   Morbid obesity (HCC) 02/27/2022   Pes anserinus bursitis of left knee 02/08/2020   Status post total knee replacement using cement, left 09/22/2019   Status post total knee replacement using cement, right 08/12/2018   Rotator cuff rupture    Status post bilateral breast reconstruction 02/06/2013   Acquired absence of bilateral breasts and nipples 09/17/2012   Breast cancer of lower-outer quadrant of left female breast (HCC) 01/16/2011    PCP: Valora Agent, MD  REFERRING PROVIDER: Valora Agent, MD  REFERRING DIAG:  M54.16 (ICD-10-CM) - Radiculopathy, lumbar region   RATIONALE FOR EVALUATION AND TREATMENT: Rehabilitation  THERAPY DIAG: Other low back pain  Muscle weakness (generalized)  ONSET DATE: Chronic  FOLLOW-UP APPT SCHEDULED WITH REFERRING PROVIDER: Yes    SUBJECTIVE:                                                                                                                                                                                         SUBJECTIVE  STATEMENT:    Patient reporting to OPPT with a chief concern of lower back pain.   PERTINENT HISTORY:   Krystal [name] is a 73 y.o. female presenting to  OPPT with chronic lower back pain. She is familiar to this clinic and has recently d/c from PT for reverse total shoulder replacement (L).  Patient reports that 8-9 mos ago she thought sciatic pain however she realized the pain was originating from the back. Patient reports death in family (father) 1 yr ago; she reports that she was the primary caregiver for two years. She reports that her lower back pain radiates into both of her legs (R > L); pain stops at the knees.   Aggravating factors: Prolonged sitting in a straight chair; reaching for weeds;  Relieving factors: Leaning forward, bending forward; tylenol    She denies saddle paraesthesia, changes in bowel/bladder, acute trauma to lumbar spine.    Imaging: (per chart review 03/09/2024):  Two-view lumbar spine X-ray, PA and lateral, performed in the office today and personally reviewed. The patient does not have any significant scoliosis. Minimal arthritis is noted in the hip joints, with no arthritis in the sacroiliac joints. The patient has five non-rib-bearing lumbar vertebrae. Grade 1 spondylolisthesis is observed at L3-4 and L4-5. No compression fractures are noted.    PAIN:    Pain Intensity: Present: 6/10, Best: 5/10, Worst: 8/10 Pain location: R Lower back with intermittent L involvement  Pain Quality: constant, burning, and real hot  Radiating: Yes  Numbness/Tingling: No  PRECAUTIONS: None  WEIGHT BEARING RESTRICTIONS: No  FALLS: Has patient fallen in last 6 months? No  Living Environment Lives with: lives with their spouse Lives in: House/apartment Stairs: Yes: Internal: 13 steps; on right going up Has following equipment at home: None  Prior level of function: Independent  Occupational demands: Retired  Hobbies: 8 yr old dog; Cleaning  Patient Goals: I would  like to stand up straight without pain    OBJECTIVE:  Patient Surveys  mODI: 15/50 - 30%  Cognition WNL    Gross Musculoskeletal Assessment Tremor: None Bulk: Normal Tone: Normal No visible step-off along spinal column, no signs of scoliosis  GAIT: Distance walked: 43m  Assistive device utilized: None Level of assistance: Complete Independence Comments: Forward Trunk Lean  Posture: Lumbar lordosis: WNL Iliac crest height: Equal bilaterally Lumbar lateral shift: Negative  AROM AROM (Normal range in degrees) AROM   Lumbar   Flexion (65) 100%  Extension (30) 100%  Right lateral flexion (25) 100%   Left lateral flexion (25) 100%  Right rotation (30) 100%   Left rotation (30) 100%       Hip Right Left  Flexion (125) 110 110  Extension (15)    Abduction (40)    Adduction     Internal Rotation (45) 25 35  External Rotation (45) 30 30      Knee    Flexion (135) WNL WNL  Extension (0) WNL WNL      Ankle    Dorsiflexion (20) WNL WNL  Plantarflexion (50)    Inversion (35)    Eversion (15)    (* = pain; Blank rows = not tested)  LE MMT: MMT (out of 5) Right  Left   Hip flexion 4- 4-  Hip extension    Hip abduction 4- 4-  Hip adduction    Hip internal rotation 4+ 4+  Hip external rotation 4+ 4+  Knee flexion 5 5  Knee extension 5 5  Ankle dorsiflexion 5 5  Ankle plantarflexion 5 5  Ankle inversion    Ankle eversion    (* = pain; Blank rows = not tested)  Sensation Grossly intact to light  touch throughout bilateral LEs as determined by testing dermatomes L2-S2. Proprioception, stereognosis, and hot/cold testing deferred on this date.  Reflexes Deferred   Muscle Length Hamstrings: R: Negative L: Negative Thomas (hip flexors): R: Not examined L: Not examined  Palpation Location Right Left         Lumbar paraspinals 2 1  Quadratus Lumborum    Gluteus Maximus 2 1  Gluteus Medius    Deep hip external rotators 0 0  PSIS 0 0  Fortin's Area  (SIJ)    Greater Trochanter 0 0  (Blank rows = not tested) Graded on 0-4 scale (0 = no pain, 1 = pain, 2 = pain with wincing/grimacing/flinching, 3 = pain with withdrawal, 4 = unwilling to allow palpation)  Passive Accessory  Motion Pt denies reproduction of back pain with CPA L1-L5 and UPA bilaterally L1-L5. Generally, hypomobile throughout  Special Tests Lumbar Radiculopathy and Discogenic: Centralization and Peripheralization (SN 92, -LR 0.12): Not examined Slump (SN 83, -LR 0.32): R: Not examined L: Not examined SLR (SN 92, -LR 0.29): R: Negative L:  Negative Crossed SLR (SP 90): R: Negative L: Negative  Facet Joint: Extension-Rotation (SN 100, -LR 0.0): R: Positive L: Not examined  Lumbar Foraminal Stenosis: Lumbar quadrant (SN 70): R: Positive L: Not examined  Hip: FABER (SN 81): R: Not examined L: Not examined FADIR (SN 94): R: Not examined L: Not examined Hip scour (SN 50): R: Not examined L: Not examined  TODAY'S TREATMENT: DATE: 03/10/24  Subjective: Patient reports moderate soreness in the lower back (6/10 NPS); patient reports taking pain mediation earlier this morning without any relief. No shoulder pain prior to start of session. No questions or concerns.    Therapeutic Exercise:   Seated Lumbar Rollout with Physio Ball for lumbar mobility    Forward: 1 x 15 - White Physio   R/L Direction: 1 x 8    Supine Lumbar Rotation (Knee Bent) for lumbar mobility    2 x 8 ea direction     Supine Bridges    3 x 10 - Last set against 3 Kg ball on stomach for TrA cue    Seated Pallof Press    2 x 15 - Blue TB    1 x 12 - Grey TB    Supine Lumbar Crossover Stretch   R/L: 30s/bout x 3 in order to improve pain and tissue extensibility   Supine Double Knee to Chest stretch for lumbar/glute max    30s/bout x 3 in order to improve pain and tissue extensibility     Therapeutic Activity:  NuStep L5-2 x 6 min x UE/LE (Seat 7) for LE warm up, endurance and strength; PT  manually adjusted resistance throughout per patient tolerance.   Supine 90/90 with leg extension for core stabilization and LE strength  3 x 16 - alternating LE   Supine 90/90 with OH Reach against resistance  for core stabilization and UE strength 3 x 10 - 3Kg Med ball  Horizontal Torso Rotation against resistance  L: 2 x 15 ea direction - Blue TB   R: 1 x 15 - BTB, 1 x 10, minor pain in reported in R glute max/hip region    PATIENT EDUCATION:  Education details: Exercise Technique Person educated: Patient Education method: Explanation, Demonstration, and Handouts Education comprehension: verbalized understanding and returned demonstration   HOME EXERCISE PROGRAM:  Access Code: [email or web address] URL: [email or web address] Date: 03/09/2024 Prepared by: Lonni Pall  Exercises - Supine Bilateral Hip Internal Rotation Stretch  -  1 x daily - 7 x weekly - 2-3 sets - 30s hold - Supine Lower Trunk Rotation  - 1 x daily - 7 x weekly - 2-3 sets - 10 reps - Supine Bridge  - 1 x daily - 3-4 x weekly - 2-3 sets - 10-12 reps - Supine Double Bent Leg Lift  - 1 x daily - 3-4 x weekly - 2-3 sets - 10-12 reps   ASSESSMENT:  CLINICAL IMPRESSION:  Patient returns to OPPT for first f/u for management of lower back pain. Good tolerance to all PT interventions without exacerbation in lower back pain. PT focused on flexion and rotational core stability in order to improve core stabilization exercises in order to improve core stability with dynamic movements. Patient able to maintain TrA activation with LE extension exercises without increased anterior pelvic tilt or lumbar extension. Good demonstration of core stabilization without LE stabilization (supine 90/90 leg ext.) however she quickly fatigues due to LE weakness. Intermittent pauses throughout session in order to produce posterior pelvic tilt/lumbar flexion to reduce pain in the lower back. Pt endorsed improvements following stretching  supine stretching techniques. PT plans to assess core stabilization and pelvic tilt during standing and lifting activities. She still experiences intermittent moderate to severe pain in her lumbar spine limiting her full participation with recreational activities. Based on today's performance, pt will continue to benefit from skilled PT in order to facilitate return to PLOF and improve QoL.   OBJECTIVE IMPAIRMENTS: decreased activity tolerance, decreased ROM, decreased strength, and pain.   ACTIVITY LIMITATIONS: lifting, bending, standing, squatting, transfers, and caring for others  PARTICIPATION LIMITATIONS: driving and yard work  PERSONAL FACTORS: Age, Behavior pattern, Past/current experiences, Time since onset of injury/illness/exacerbation, and 1-2 comorbidities: s/p L Reverse TSR, s/p bilateral TKR  are also affecting patient's functional outcome.   REHAB POTENTIAL: Fair Chronicity  CLINICAL DECISION MAKING: Evolving/moderate complexity  EVALUATION COMPLEXITY: Moderate   GOALS: Goals reviewed with patient? No  SHORT TERM GOALS: Target date: 04/07/2024  Pt will be independent with HEP in order to improve strength and decrease back pain to improve pain-free function at home and work. Baseline: 03/09/2024: initial HEP Goal status: INITIAL   LONG TERM GOALS: Target date: 05/05/2024  Pt will increase by at least 1.1 m/s in order to demonstrate clinically significant improvement in community ambulation. Baseline: 03/09/2024: .97 m/s Goal status: INITIAL  2.  Pt will decrease worst back pain by at least 2 points on the NPRS in order to demonstrate clinically significant reduction in back pain. Baseline: 03/09/2024: 10/10 NPS Goal status: INITIAL  3.  Pt will decrease mODI score by at least 13 points in order demonstrate clinically significant reduction in back pain/disability.       Baseline: 03/09/2024: 15/50 -30% Goal status: INITIAL  4.  Pt will increase R Hip IR to  35 in order to demonstrate significant improvements in hip mobility and limb symmetry.  Baseline: 03/09/2024: R IR: 25 Goal status: INITIAL   PLAN:  PT FREQUENCY: 1-2x/week  PT DURATION: 8 weeks  PLANNED INTERVENTIONS: [id number]- PT Re-evaluation, 97110-Therapeutic exercises, 97530- Therapeutic activity, 97112- Neuromuscular re-education, 97535- Self Care, [id number]- Manual therapy, Joint mobilization, Cryotherapy, and Moist heat  PLAN FOR NEXT SESSION: Review HEP; initiate core stabilization, flexion-based exercises, supine core, hip strengthening   Lonni Pall PT, DPT Physical Therapist- [place]  03/10/2024, 11:20 AM

## 2024-03-12 ENCOUNTER — Ambulatory Visit

## 2024-03-12 DIAGNOSIS — M25511 Pain in right shoulder: Secondary | ICD-10-CM | POA: Diagnosis not present

## 2024-03-12 DIAGNOSIS — M25512 Pain in left shoulder: Secondary | ICD-10-CM | POA: Diagnosis not present

## 2024-03-12 DIAGNOSIS — M6281 Muscle weakness (generalized): Secondary | ICD-10-CM | POA: Diagnosis not present

## 2024-03-12 DIAGNOSIS — M5459 Other low back pain: Secondary | ICD-10-CM

## 2024-03-12 DIAGNOSIS — R262 Difficulty in walking, not elsewhere classified: Secondary | ICD-10-CM

## 2024-03-12 DIAGNOSIS — M25612 Stiffness of left shoulder, not elsewhere classified: Secondary | ICD-10-CM | POA: Diagnosis not present

## 2024-03-12 DIAGNOSIS — G8929 Other chronic pain: Secondary | ICD-10-CM | POA: Diagnosis not present

## 2024-03-12 NOTE — Therapy (Signed)
 OUTPATIENT PHYSICAL THERAPY THORACOLUMBAR TREATMENT   Patient Name: Krystal [name] MRN: [id number] DOB:February 06, 1951, 73 y.o., female Today's Date: 03/12/2024  END OF SESSION:  PT End of Session - 03/12/24 1112     Visit Number 3    Number of Visits 17    Date for PT Re-Evaluation 05/04/24    Authorization Type auth 20 visits    Authorization Time Period 6/5 - 8/29    Authorization - Visit Number 15    Authorization - Number of Visits 20    PT Start Time 1114    PT Stop Time 1155    PT Time Calculation (min) 41 min    Activity Tolerance Patient tolerated treatment well    Behavior During Therapy [facility] for tasks assessed/performed          Past Medical History:  Diagnosis Date   Anemia    Arthritis    osteo   Blindness of right eye at birth   Cancer [facility]) 2011   Left breast 2011 and cervical age 34   Carpal tunnel syndrome    Cataract    Depression    Edema 07/27/2011   Fibromyalgia    muscle weakness and pain   GERD (gastroesophageal reflux disease)    Hyperlipidemia    Hypertension    benign   Lymphedema    Osteoporosis    Plantar fasciitis    Pre-diabetes    Raynaud's disease    Restless leg syndrome    Rosacea    Rotator cuff rupture    Sleep apnea    Synovitis    Ulcer    Past Surgical History:  Procedure Laterality Date   ABDOMINAL HYSTERECTOMY  1986   For bleeding and pain   ATRIAL FIBRILLATION ABLATION N/A 05/14/2023   Procedure: ATRIAL FIBRILLATION ABLATION;  Surgeon: Cindie Ole DASEN, MD;  Location: [facility];  Service: Cardiovascular;  Laterality: N/A;   BLADDER SURGERY  2006   Bladder tack   BREAST RECONSTRUCTION Bilateral 09/17/2012   Procedure: BREAST RECONSTRUCTION;  Surgeon: Estefana Reichert, DO;  Location: [facility];  Service: Plastics;  Laterality: Bilateral;  BILATERAL BREAST RECONSTRUCTION WITH TISSUE EXPANDERS AND ALLOMED   BREAST RECONSTRUCTION Right 06/03/2013   Procedure: RIGHT BREAST CAPSULE CONSTRACTURE;   Surgeon: Estefana Reichert, DO;  Location: [facility];  Service: Plastics;  Laterality: Right;   CAPSULECTOMY Right 11/18/2023   Procedure: EXCISION OF RIGHT BREAST CAPSULE CONTRACTURE AND BREAST TISSUE;  Surgeon: Lowery Estefana RAMAN, DO;  Location: [facility];  Service: Plastics;  Laterality: Right;   CARDIOVERSION N/A 08/06/2022   Procedure: CARDIOVERSION;  Surgeon: Darron Deatrice LABOR, MD;  Location: [facility];  Service: Cardiovascular;  Laterality: N/A;   CARDIOVERSION N/A 10/02/2022   Procedure: CARDIOVERSION;  Surgeon: Mady Bruckner, MD;  Location: [facility];  Service: Cardiovascular;  Laterality: N/A;   CARPAL TUNNEL RELEASE Bilateral 2008   EYE SURGERY Right    INJECTION KNEE Left 08/12/2018   Procedure: KNEE INJECTION-LEFT;  Surgeon: Edie Norleen PARAS, MD;  Location: [facility];  Service: Orthopedics;  Laterality: Left;   JOINT REPLACEMENT     LIPOSUCTION Bilateral 01/29/2013   Procedure: LIPOSUCTION;  Surgeon: Estefana Reichert, DO;  Location: [facility];  Service: Plastics;  Laterality: Bilateral;   LIPOSUCTION Right 06/03/2013   Procedure: LIPOSUCTION;  Surgeon: Estefana Reichert, DO;  Location:  SURGERY CENTER;  Service: Plastics;  Laterality: Right;   LIPOSUCTION Right 11/18/2023   Procedure: LIPOSUCTION LATERAL RIGHT CHEST;  Surgeon: Lowery Estefana RAMAN, DO;  Location: [facility];  Service: Plastics;  Laterality: Right;   MASTECTOMY  2012   rt prophalactic mast-snbx   MASTECTOMY MODIFIED RADICAL  2012   left-axillary nodes   NEUROMA SURGERY Bilateral    NOSE SURGERY  2007   PORT-A-CATH REMOVAL     insertion and   REVERSE SHOULDER ARTHROPLASTY Left 01/16/2024   Procedure: ARTHROPLASTY, SHOULDER, TOTAL, REVERSE;  Surgeon: Melita Drivers, MD;  Location: [facility];  Service: Orthopedics;  Laterality: Left;   REVISION, RECONSTRUCTION, BREAST Right 11/18/2023   Procedure: REVISION RIGHT BREAST RECONSTRUCTION;  Surgeon: Lowery Estefana RAMAN, DO;  Location: [facility];  Service: Plastics;  Laterality: Right;  Excision of scar, capsule contracture right breast , possible liposuction   THROAT SURGERY     TONSILLECTOMY     TOTAL KNEE ARTHROPLASTY Right 08/12/2018   Procedure: TOTAL KNEE ARTHROPLASTY-RIGHT;  Surgeon: Edie Norleen PARAS, MD;  Location: [facility];  Service: Orthopedics;  Laterality: Right;   TOTAL KNEE ARTHROPLASTY Left 09/22/2019   Procedure: TOTAL KNEE ARTHROPLASTY;  Surgeon: Edie Norleen PARAS, MD;  Location: [facility];  Service: Orthopedics;  Laterality: Left;   UVULOPALATOPHARYNGOPLASTY     Patient Active Problem List   Diagnosis Date Noted   Raynaud's disease 11/05/2022   Osteoporosis, post-menopausal 11/05/2022   Atrial flutter (HCC) 10/02/2022   First degree AV block 09/11/2022   Arthritis 08/08/2022   Persistent atrial fibrillation (HCC) 08/06/2022   Depression 02/27/2022   Gastroesophageal reflux disease 02/27/2022   Hypertension 02/27/2022   Abnormal LFTs 02/27/2022   Alcohol  abuse 02/27/2022   Atrial fibrillation with RVR (HCC) 02/27/2022   Morbid obesity (HCC) 02/27/2022   Pes anserinus bursitis of left knee 02/08/2020   Status post total knee replacement using cement, left 09/22/2019   Status post total knee replacement using cement, right 08/12/2018   Rotator cuff rupture    Status post bilateral breast reconstruction 02/06/2013   Acquired absence of bilateral breasts and nipples 09/17/2012   Breast cancer of lower-outer quadrant of left female breast (HCC) 01/16/2011    PCP: Valora Agent, MD  REFERRING PROVIDER: Bonner Ade, MD  REFERRING DIAG:  M54.16 (ICD-10-CM) - Radiculopathy, lumbar region   RATIONALE FOR EVALUATION AND TREATMENT: Rehabilitation  THERAPY DIAG: Other low back pain  Muscle weakness (generalized)  Difficulty in walking, not elsewhere classified  ONSET DATE: Chronic  FOLLOW-UP APPT SCHEDULED WITH REFERRING PROVIDER: Yes    SUBJECTIVE:  SUBJECTIVE STATEMENT:    Patient reporting to OPPT with a chief concern of lower back pain.   PERTINENT HISTORY:   [name] is a 73 y.o. female presenting to OPPT with chronic lower back pain. She is familiar to this clinic and has recently d/c from PT for reverse total shoulder replacement (L).  Patient reports that 8-9 mos ago she thought sciatic pain however she realized the pain was originating from the back. Patient reports death in family (father) 1 yr ago; she reports that she was the primary caregiver for two years. She reports that her lower back pain radiates into both of her legs (R > L); pain stops at the knees.   Aggravating factors: Prolonged sitting in a straight chair; reaching for weeds;  Relieving factors: Leaning forward, bending forward; tylenol    She denies saddle paraesthesia, changes in bowel/bladder, acute trauma to lumbar spine.    Imaging: (per chart review 03/09/2024):  Two-view lumbar spine X-ray, PA and lateral, performed in the office today and personally reviewed. The patient does not have any significant scoliosis. Minimal arthritis is noted in the hip joints, with no arthritis in the sacroiliac joints. The patient has five non-rib-bearing lumbar vertebrae. Grade 1 spondylolisthesis is observed at L3-4 and L4-5. No compression fractures are noted.    PAIN:    Pain Intensity: Present: 6/10, Best: 5/10, Worst: 8/10 Pain location: R Lower back with intermittent L involvement  Pain Quality: constant, burning, and real hot  Radiating: Yes  Numbness/Tingling: No  PRECAUTIONS: None  WEIGHT BEARING RESTRICTIONS: No  FALLS: Has patient fallen in last 6 months? No  Living Environment Lives with: lives with their spouse Lives in: House/apartment Stairs: Yes: Internal: 13 steps; on right  going up Has following equipment at home: None  Prior level of function: Independent  Occupational demands: Retired  Hobbies: 69 yr old dog; Cleaning  Patient Goals: I would like to stand up straight without pain    OBJECTIVE:  Patient Surveys  mODI: 15/50 - 30%  Cognition WNL    Gross Musculoskeletal Assessment Tremor: None Bulk: Normal Tone: Normal No visible step-off along spinal column, no signs of scoliosis  GAIT: Distance walked: 35m  Assistive device utilized: None Level of assistance: Complete Independence Comments: Forward Trunk Lean  Posture: Lumbar lordosis: WNL Iliac crest height: Equal bilaterally Lumbar lateral shift: Negative  AROM AROM (Normal range in degrees) AROM   Lumbar   Flexion (65) 100%  Extension (30) 100%  Right lateral flexion (25) 100%   Left lateral flexion (25) 100%  Right rotation (30) 100%   Left rotation (30) 100%       Hip Right Left  Flexion (125) 110 110  Extension (15)    Abduction (40)    Adduction     Internal Rotation (45) 25 35  External Rotation (45) 30 30      Knee    Flexion (135) WNL WNL  Extension (0) WNL WNL      Ankle    Dorsiflexion (20) WNL WNL  Plantarflexion (50)    Inversion (35)    Eversion (15)    (* = pain; Blank rows = not tested)  LE MMT: MMT (out of 5) Right  Left   Hip flexion 4- 4-  Hip extension    Hip abduction 4- 4-  Hip adduction    Hip internal rotation 4+ 4+  Hip external rotation 4+ 4+  Knee flexion 5 5  Knee extension 5 5  Ankle dorsiflexion  5 5  Ankle plantarflexion 5 5  Ankle inversion    Ankle eversion    (* = pain; Blank rows = not tested)  Sensation Grossly intact to light touch throughout bilateral LEs as determined by testing dermatomes L2-S2. Proprioception, stereognosis, and hot/cold testing deferred on this date.  Reflexes Deferred   Muscle Length Hamstrings: R: Negative L: Negative Thomas (hip flexors): R: Not examined L: Not  examined  Palpation Location Right Left         Lumbar paraspinals 2 1  Quadratus Lumborum    Gluteus Maximus 2 1  Gluteus Medius    Deep hip external rotators 0 0  PSIS 0 0  Fortin's Area (SIJ)    Greater Trochanter 0 0  (Blank rows = not tested) Graded on 0-4 scale (0 = no pain, 1 = pain, 2 = pain with wincing/grimacing/flinching, 3 = pain with withdrawal, 4 = unwilling to allow palpation)  Passive Accessory  Motion Pt denies reproduction of back pain with CPA L1-L5 and UPA bilaterally L1-L5. Generally, hypomobile throughout  Special Tests Lumbar Radiculopathy and Discogenic: Centralization and Peripheralization (SN 92, -LR 0.12): Not examined Slump (SN 83, -LR 0.32): R: Not examined L: Not examined SLR (SN 92, -LR 0.29): R: Negative L:  Negative Crossed SLR (SP 90): R: Negative L: Negative  Facet Joint: Extension-Rotation (SN 100, -LR 0.0): R: Positive L: Not examined  Lumbar Foraminal Stenosis: Lumbar quadrant (SN 70): R: Positive L: Not examined  Hip: FABER (SN 81): R: Not examined L: Not examined FADIR (SN 94): R: Not examined L: Not examined Hip scour (SN 50): R: Not examined L: Not examined  TODAY'S TREATMENT: DATE: 03/12/24  Subjective: Patient reports 5/10 NPS pain in the lower back. She took pain medication prior to PT session. She reports that she went to [place] yesterday and had a lot of lower back pain. No questions or concerns.    Therapeutic Exercise:   Seated Lumbar Rollout with Physio Ball for lumbar mobility    Forward: 1 x 15 - White Physio  Seated Pallof Press with Posterior Pelvic Tilt    4 x 15 reps - Blue TB      Supine 90/90 and UE Physio Ball Squeeze for core stabilization   30s/bout x 3 in order to improve ROM and tissue extensibility   Supine Double Knee To Chest    30s/bout x 3 in order to improve lumbar mobility and tissue extensibility   Supine Glute Crossover stretch    30s/bout x 2 in order to improve tissue  extensibility  Therapeutic Activity:  NuStep L5-2 x 7 min x UE/LE (Seat 7) for LE/UE warm up, endurance and strength; PT manually adjusted resistance throughout per patient tolerance. No exacerbation to LBP with increased level  Seated Alternating Landmine Press and rotation  3 x 20 (10 ea side)  Standing Tall Marches with Water Bag for core stabilization and balance against external perturbation   2 x 20 marches - 20# Water Bag   Squatting and Reaching against resistance - Blue TB anchored with 25# Plate   2 x 10 ea UE - ac  Lifting Weighted Box - with posterior pelvic tilt   1 x 10 - 19# Weighted     PATIENT EDUCATION:  Education details: Exercise Technique Person educated: Patient Education method: Explanation, Demonstration, and Handouts Education comprehension: verbalized understanding and returned demonstration   HOME EXERCISE PROGRAM:  Access Code: [email or web address] URL: [email or web address] Date: 03/09/2024 Prepared by: Lonni Pall  Exercises -  Supine Bilateral Hip Internal Rotation Stretch  - 1 x daily - 7 x weekly - 2-3 sets - 30s hold - Supine Lower Trunk Rotation  - 1 x daily - 7 x weekly - 2-3 sets - 10 reps - Supine Bridge  - 1 x daily - 3-4 x weekly - 2-3 sets - 10-12 reps - Supine Double Bent Leg Lift  - 1 x daily - 3-4 x weekly - 2-3 sets - 10-12 reps   ASSESSMENT:  CLINICAL IMPRESSION: Continued PT POC focused on postural correction and core strengthening. PT focused on functional activities with pelvic posterior rotation in order for improved core activation and decreased impingement at lumbar spine. Patient able to maintain posterior pelvic tilt and good TrA activation during seated exercises with dynamic movements. Good demonstration on proper lifting mechanics and squatting technique while simulating functional ADLs such as weeding and lifting light/medium sized objects. PT will plan to continue core strengthening in trunk flexion based  positions and educating patient on proper body mechanics with lifting. She still experiences intermittent moderate to severe pain in her lumbar spine limiting her full participation with recreational activities. Based on today's performance, pt will continue to benefit from skilled PT in order to facilitate return to PLOF and improve QoL.   OBJECTIVE IMPAIRMENTS: decreased activity tolerance, decreased ROM, decreased strength, and pain.   ACTIVITY LIMITATIONS: lifting, bending, standing, squatting, transfers, and caring for others  PARTICIPATION LIMITATIONS: driving and yard work  PERSONAL FACTORS: Age, Behavior pattern, Past/current experiences, Time since onset of injury/illness/exacerbation, and 1-2 comorbidities: s/p L Reverse TSR, s/p bilateral TKR  are also affecting patient's functional outcome.   REHAB POTENTIAL: Fair Chronicity  CLINICAL DECISION MAKING: Evolving/moderate complexity  EVALUATION COMPLEXITY: Moderate   GOALS: Goals reviewed with patient? No  SHORT TERM GOALS: Target date: 04/09/2024  Pt will be independent with HEP in order to improve strength and decrease back pain to improve pain-free function at home and work. Baseline: 03/09/2024: initial HEP Goal status: INITIAL   LONG TERM GOALS: Target date: 05/07/2024  Pt will increase by at least 1.1 m/s in order to demonstrate clinically significant improvement in community ambulation. Baseline: 03/09/2024: .97 m/s Goal status: INITIAL  2.  Pt will decrease worst back pain by at least 2 points on the NPRS in order to demonstrate clinically significant reduction in back pain. Baseline: 03/09/2024: 10/10 NPS Goal status: INITIAL  3.  Pt will decrease mODI score by at least 13 points in order demonstrate clinically significant reduction in back pain/disability.       Baseline: 03/09/2024: 15/50 -30% Goal status: INITIAL  4.  Pt will increase R Hip IR to 35 in order to demonstrate significant improvements  in hip mobility and limb symmetry.  Baseline: 03/09/2024: R IR: 25 Goal status: INITIAL   PLAN:  PT FREQUENCY: 1-2x/week  PT DURATION: 8 weeks  PLANNED INTERVENTIONS: [id number]- PT Re-evaluation, 97110-Therapeutic exercises, 97530- Therapeutic activity, 97112- Neuromuscular re-education, 97535- Self Care, [id number]- Manual therapy, Joint mobilization, Cryotherapy, and Moist heat  PLAN FOR NEXT SESSION: Review HEP; initiate core stabilization, flexion-based exercises, supine core, hip strengthening   Lonni Pall PT, DPT Physical Therapist- [place]  03/12/2024, 12:13 PM

## 2024-03-17 ENCOUNTER — Ambulatory Visit

## 2024-03-17 DIAGNOSIS — M6281 Muscle weakness (generalized): Secondary | ICD-10-CM

## 2024-03-17 DIAGNOSIS — M25511 Pain in right shoulder: Secondary | ICD-10-CM | POA: Diagnosis not present

## 2024-03-17 DIAGNOSIS — R262 Difficulty in walking, not elsewhere classified: Secondary | ICD-10-CM | POA: Diagnosis not present

## 2024-03-17 DIAGNOSIS — M25612 Stiffness of left shoulder, not elsewhere classified: Secondary | ICD-10-CM | POA: Diagnosis not present

## 2024-03-17 DIAGNOSIS — M5459 Other low back pain: Secondary | ICD-10-CM

## 2024-03-17 DIAGNOSIS — M25512 Pain in left shoulder: Secondary | ICD-10-CM | POA: Diagnosis not present

## 2024-03-17 DIAGNOSIS — G8929 Other chronic pain: Secondary | ICD-10-CM | POA: Diagnosis not present

## 2024-03-17 NOTE — Therapy (Signed)
 OUTPATIENT PHYSICAL THERAPY THORACOLUMBAR TREATMENT   Patient Name: Krystal [name] MRN: [id number] DOB:Dec 09, 1950, 73 y.o., female Today's Date: 03/17/2024  END OF SESSION:  PT End of Session - 03/17/24 1119     Visit Number 4    Number of Visits 17    Date for PT Re-Evaluation 05/04/24    Authorization Type auth 20 visits    Authorization Time Period 6/5 - 8/29    Authorization - Number of Visits 20    PT Start Time 1117    PT Stop Time 1158    PT Time Calculation (min) 41 min    Activity Tolerance Patient tolerated treatment well    Behavior During Therapy [facility] for tasks assessed/performed          Past Medical History:  Diagnosis Date   Anemia    Arthritis    osteo   Blindness of right eye at birth   Cancer [facility]) 2011   Left breast 2011 and cervical age 30   Carpal tunnel syndrome    Cataract    Depression    Edema 07/27/2011   Fibromyalgia    muscle weakness and pain   GERD (gastroesophageal reflux disease)    Hyperlipidemia    Hypertension    benign   Lymphedema    Osteoporosis    Plantar fasciitis    Pre-diabetes    Raynaud's disease    Restless leg syndrome    Rosacea    Rotator cuff rupture    Sleep apnea    Synovitis    Ulcer    Past Surgical History:  Procedure Laterality Date   ABDOMINAL HYSTERECTOMY  1986   For bleeding and pain   ATRIAL FIBRILLATION ABLATION N/A 05/14/2023   Procedure: ATRIAL FIBRILLATION ABLATION;  Surgeon: Cindie Ole DASEN, MD;  Location: [facility];  Service: Cardiovascular;  Laterality: N/A;   BLADDER SURGERY  2006   Bladder tack   BREAST RECONSTRUCTION Bilateral 09/17/2012   Procedure: BREAST RECONSTRUCTION;  Surgeon: Estefana Reichert, DO;  Location: [facility];  Service: Plastics;  Laterality: Bilateral;  BILATERAL BREAST RECONSTRUCTION WITH TISSUE EXPANDERS AND ALLOMED   BREAST RECONSTRUCTION Right 06/03/2013   Procedure: RIGHT BREAST CAPSULE CONSTRACTURE;  Surgeon: Estefana Reichert, DO;  Location:  [facility];  Service: Plastics;  Laterality: Right;   CAPSULECTOMY Right 11/18/2023   Procedure: EXCISION OF RIGHT BREAST CAPSULE CONTRACTURE AND BREAST TISSUE;  Surgeon: Lowery Estefana RAMAN, DO;  Location: [facility];  Service: Plastics;  Laterality: Right;   CARDIOVERSION N/A 08/06/2022   Procedure: CARDIOVERSION;  Surgeon: Darron Deatrice LABOR, MD;  Location: [facility];  Service: Cardiovascular;  Laterality: N/A;   CARDIOVERSION N/A 10/02/2022   Procedure: CARDIOVERSION;  Surgeon: Mady Bruckner, MD;  Location: [facility];  Service: Cardiovascular;  Laterality: N/A;   CARPAL TUNNEL RELEASE Bilateral 2008   EYE SURGERY Right    INJECTION KNEE Left 08/12/2018   Procedure: KNEE INJECTION-LEFT;  Surgeon: Edie Norleen PARAS, MD;  Location: [facility];  Service: Orthopedics;  Laterality: Left;   JOINT REPLACEMENT     LIPOSUCTION Bilateral 01/29/2013   Procedure: LIPOSUCTION;  Surgeon: Estefana Reichert, DO;  Location: [facility];  Service: Plastics;  Laterality: Bilateral;   LIPOSUCTION Right 06/03/2013   Procedure: LIPOSUCTION;  Surgeon: Estefana Reichert, DO;  Location: [facility];  Service: Plastics;  Laterality: Right;   LIPOSUCTION Right 11/18/2023   Procedure: LIPOSUCTION LATERAL RIGHT CHEST;  Surgeon: Lowery Estefana RAMAN, DO;  Location:  [facility];  Service: Plastics;  Laterality: Right;   MASTECTOMY  2012   rt prophalactic mast-snbx   MASTECTOMY MODIFIED RADICAL  2012   left-axillary nodes   NEUROMA SURGERY Bilateral    NOSE SURGERY  2007   PORT-A-CATH REMOVAL     insertion and   REVERSE SHOULDER ARTHROPLASTY Left 01/16/2024   Procedure: ARTHROPLASTY, SHOULDER, TOTAL, REVERSE;  Surgeon: Melita Drivers, MD;  Location: [facility];  Service: Orthopedics;  Laterality: Left;   REVISION, RECONSTRUCTION, BREAST Right 11/18/2023   Procedure: REVISION RIGHT BREAST RECONSTRUCTION;  Surgeon: Lowery Estefana RAMAN, DO;  Location: [facility];  Service: Plastics;  Laterality: Right;  Excision of scar, capsule contracture right breast , possible liposuction   THROAT SURGERY     TONSILLECTOMY     TOTAL KNEE ARTHROPLASTY Right 08/12/2018   Procedure: TOTAL KNEE ARTHROPLASTY-RIGHT;  Surgeon: Edie Norleen PARAS, MD;  Location: [facility];  Service: Orthopedics;  Laterality: Right;   TOTAL KNEE ARTHROPLASTY Left 09/22/2019   Procedure: TOTAL KNEE ARTHROPLASTY;  Surgeon: Edie Norleen PARAS, MD;  Location: [facility];  Service: Orthopedics;  Laterality: Left;   UVULOPALATOPHARYNGOPLASTY     Patient Active Problem List   Diagnosis Date Noted   Raynaud's disease 11/05/2022   Osteoporosis, post-menopausal 11/05/2022   Atrial flutter (HCC) 10/02/2022   First degree AV block 09/11/2022   Arthritis 08/08/2022   Persistent atrial fibrillation (HCC) 08/06/2022   Depression 02/27/2022   Gastroesophageal reflux disease 02/27/2022   Hypertension 02/27/2022   Abnormal LFTs 02/27/2022   Alcohol  abuse 02/27/2022   Atrial fibrillation with RVR (HCC) 02/27/2022   Morbid obesity (HCC) 02/27/2022   Pes anserinus bursitis of left knee 02/08/2020   Status post total knee replacement using cement, left 09/22/2019   Status post total knee replacement using cement, right 08/12/2018   Rotator cuff rupture    Status post bilateral breast reconstruction 02/06/2013   Acquired absence of bilateral breasts and nipples 09/17/2012   Breast cancer of lower-outer quadrant of left female breast (HCC) 01/16/2011    PCP: Valora Agent, MD  REFERRING PROVIDER: Garland Sora, PA-C  REFERRING DIAG:  M54.16 (ICD-10-CM) - Radiculopathy, lumbar region   RATIONALE FOR EVALUATION AND TREATMENT: Rehabilitation  THERAPY DIAG: Other low back pain  Muscle weakness (generalized)  Difficulty in walking, not elsewhere classified  ONSET DATE: Chronic  FOLLOW-UP APPT SCHEDULED WITH REFERRING PROVIDER: Yes    SUBJECTIVE:  SUBJECTIVE STATEMENT:    Patient reporting to OPPT with a chief concern of lower back pain.   PERTINENT HISTORY:   [name] is a 73 y.o. female presenting to OPPT with chronic lower back pain. She is familiar to this clinic and has recently d/c from PT for reverse total shoulder replacement (L).  Patient reports that 8-9 mos ago she thought sciatic pain however she realized the pain was originating from the back. Patient reports death in family (father) 1 yr ago; she reports that she was the primary caregiver for two years. She reports that her lower back pain radiates into both of her legs (R > L); pain stops at the knees.   Aggravating factors: Prolonged sitting in a straight chair; reaching for weeds;  Relieving factors: Leaning forward, bending forward; tylenol    She denies saddle paraesthesia, changes in bowel/bladder, acute trauma to lumbar spine.    Imaging: (per chart review 03/09/2024):  Two-view lumbar spine X-ray, PA and lateral, performed in the office today and personally reviewed. The patient does not have any significant scoliosis. Minimal arthritis is noted in the hip joints, with no arthritis in the sacroiliac joints. The patient has five non-rib-bearing lumbar vertebrae. Grade 1 spondylolisthesis is observed at L3-4 and L4-5. No compression fractures are noted.    PAIN:    Pain Intensity: Present: 6/10, Best: 5/10, Worst: 8/10 Pain location: R Lower back with intermittent L involvement  Pain Quality: constant, burning, and real hot  Radiating: Yes  Numbness/Tingling: No  PRECAUTIONS: None  WEIGHT BEARING RESTRICTIONS: No  FALLS: Has patient fallen in last 6 months? No  Living Environment Lives with: lives with their spouse Lives in: House/apartment Stairs: Yes: Internal: 13 steps; on right going up Has following equipment at  home: None  Prior level of function: Independent  Occupational demands: Retired  Hobbies: 1 yr old dog; Cleaning  Patient Goals: I would like to stand up straight without pain    OBJECTIVE:  Patient Surveys  mODI: 15/50 - 30%  Cognition WNL    Gross Musculoskeletal Assessment Tremor: None Bulk: Normal Tone: Normal No visible step-off along spinal column, no signs of scoliosis  GAIT: Distance walked: 57m  Assistive device utilized: None Level of assistance: Complete Independence Comments: Forward Trunk Lean  Posture: Lumbar lordosis: WNL Iliac crest height: Equal bilaterally Lumbar lateral shift: Negative  AROM AROM (Normal range in degrees) AROM   Lumbar   Flexion (65) 100%  Extension (30) 100%  Right lateral flexion (25) 100%   Left lateral flexion (25) 100%  Right rotation (30) 100%   Left rotation (30) 100%       Hip Right Left  Flexion (125) 110 110  Extension (15)    Abduction (40)    Adduction     Internal Rotation (45) 25 35  External Rotation (45) 30 30      Knee    Flexion (135) WNL WNL  Extension (0) WNL WNL      Ankle    Dorsiflexion (20) WNL WNL  Plantarflexion (50)    Inversion (35)    Eversion (15)    (* = pain; Blank rows = not tested)  LE MMT: MMT (out of 5) Right  Left   Hip flexion 4- 4-  Hip extension    Hip abduction 4- 4-  Hip adduction    Hip internal rotation 4+ 4+  Hip external rotation 4+ 4+  Knee flexion 5 5  Knee extension 5 5  Ankle dorsiflexion  5 5  Ankle plantarflexion 5 5  Ankle inversion    Ankle eversion    (* = pain; Blank rows = not tested)  Sensation Grossly intact to light touch throughout bilateral LEs as determined by testing dermatomes L2-S2. Proprioception, stereognosis, and hot/cold testing deferred on this date.  Reflexes Deferred   Muscle Length Hamstrings: R: Negative L: Negative Thomas (hip flexors): R: Not examined L: Not examined  Palpation Location Right Left          Lumbar paraspinals 2 1  Quadratus Lumborum    Gluteus Maximus 2 1  Gluteus Medius    Deep hip external rotators 0 0  PSIS 0 0  Fortin's Area (SIJ)    Greater Trochanter 0 0  (Blank rows = not tested) Graded on 0-4 scale (0 = no pain, 1 = pain, 2 = pain with wincing/grimacing/flinching, 3 = pain with withdrawal, 4 = unwilling to allow palpation)  Passive Accessory  Motion Pt denies reproduction of back pain with CPA L1-L5 and UPA bilaterally L1-L5. Generally, hypomobile throughout  Special Tests Lumbar Radiculopathy and Discogenic: Centralization and Peripheralization (SN 92, -LR 0.12): Not examined Slump (SN 83, -LR 0.32): R: Not examined L: Not examined SLR (SN 92, -LR 0.29): R: Negative L:  Negative Crossed SLR (SP 90): R: Negative L: Negative  Facet Joint: Extension-Rotation (SN 100, -LR 0.0): R: Positive L: Not examined  Lumbar Foraminal Stenosis: Lumbar quadrant (SN 70): R: Positive L: Not examined  Hip: FABER (SN 81): R: Not examined L: Not examined FADIR (SN 94): R: Not examined L: Not examined Hip scour (SN 50): R: Not examined L: Not examined  TODAY'S TREATMENT: DATE: 03/17/24  Subjective: Patient reports 5/10 NPS pain in the lower back. No pain medication prior to PT session. No questions or concerns.    Therapeutic Exercise (focused on strengthening core stability through abdominal/hip muscles):   Seated Lumbar Rollout with Physio Ball for lumbar mobility    Forward: 1 x 20 - White Physio  Seated Pallof Press without LE Support   3 x 15 - Blue TB   Seated Torso Rotation with Med Ball for core stabilization   3 x 15 - 3 Kg MB    Supine 90/90 alternating LE with med ball press    3 x 16 reps - 3 Kg med ball    Supine 90/90 with OH reach   2 x 10 - 2 Kg MB     Supine 90/90 with pallof press   3 x 15 - Green TB    Time spent educating patient on updated HEP; reinforced adherence to HEP in order to progress core stabilization in order to reduce  symptoms.   Therapeutic Activity (improving multiple parameters/joints in order to optimize household ADLs)  :  NuStep L5-2 x 7 min x UE/LE (Seat 7) for LE/UE warm up, endurance and strength; PT manually adjusted resistance throughout per patient tolerance. No exacerbation to LBP with increased level  Lateral Stepping against resistance  4 x 15' - Green TB   4 x 15' - Green TB   Partial Squat (Green TB around thighs) with med ball throw - For LE strength and UE strength   2 x 10 - 1 Kg MB   PATIENT EDUCATION:  Education details: Exercise Technique, HEP Person educated: Patient Education method: Explanation, Demonstration, and Handouts Education comprehension: verbalized understanding and returned demonstration   HOME EXERCISE PROGRAM:  Access Code: [email or web address] URL: [email or web address] Date: 03/17/2024 Prepared by: Lonni Pall  Exercises - Supine Bilateral Hip Internal Rotation Stretch  - 1 x daily - 7 x weekly - 2-3 sets - 30s hold - Supine Lower Trunk Rotation  - 1 x daily - 7 x weekly - 2-3 sets - 10 reps - Supine Bridge  - 1 x daily - 3-4 x weekly - 2-3 sets - 10-12 reps - Supine Double Bent Leg Lift  - 1 x daily - 3-4 x weekly - 2-3 sets - 10-12 reps - Supine 90/90 Overhead Dumbbell Raise  - 1 x daily - 3-4 x weekly - 2-3 sets - 10 reps - Supine 90/90 with Leg Extensions  - 1 x daily - 3-4 x weekly - 2-3 sets - 10 reps - Standing Posterior Pelvic Tilt  - 1 x daily - 3-4 x weekly - 2-3 sets - 10-12 reps  Access Code: [email or web address] URL: [email or web address] Date: 03/09/2024 Prepared by: Lonni Johnpatrick Jenny  Exercises - Supine Bilateral Hip Internal Rotation Stretch  - 1 x daily - 7 x weekly - 2-3 sets - 30s hold - Supine Lower Trunk Rotation  - 1 x daily - 7 x weekly - 2-3 sets - 10 reps - Supine Bridge  - 1 x daily - 3-4 x weekly - 2-3 sets - 10-12 reps - Supine Double Bent Leg Lift  - 1 x daily - 3-4 x weekly - 2-3 sets - 10-12  reps   ASSESSMENT:  CLINICAL IMPRESSION: Continued PT POC focused on core strengthening in order improve lumbar pain. Good progression to supine core stability exercises. She tolerated all interventions well without exacerbation of lower back pain. Education on updated HEP focused on supine core stability; pt encouraged adherence in order to maintain balance. Her pain is still consistently moderate to severe associated with overuse with household activities, yardwork, and cleaning. PT strongly encouraged reduction in household work and activities in order to reduce further injury to recent Reverse TSR and lower back pain.  Based on today's performance, pt will continue to benefit from skilled PT in order to facilitate return to PLOF and improve QoL.   OBJECTIVE IMPAIRMENTS: decreased activity tolerance, decreased ROM, decreased strength, and pain.   ACTIVITY LIMITATIONS: lifting, bending, standing, squatting, transfers, and caring for others  PARTICIPATION LIMITATIONS: driving and yard work  PERSONAL FACTORS: Age, Behavior pattern, Past/current experiences, Time since onset of injury/illness/exacerbation, and 1-2 comorbidities: s/p L Reverse TSR, s/p bilateral TKR  are also affecting patient's functional outcome.   REHAB POTENTIAL: Fair Chronicity  CLINICAL DECISION MAKING: Evolving/moderate complexity  EVALUATION COMPLEXITY: Moderate   GOALS: Goals reviewed with patient? No  SHORT TERM GOALS: Target date: 04/14/2024  Pt will be independent with HEP in order to improve strength and decrease back pain to improve pain-free function at home and work. Baseline: 03/09/2024: initial HEP Goal status: INITIAL   LONG TERM GOALS: Target date: 05/12/2024  Pt will increase by at least 1.1 m/s in order to demonstrate clinically significant improvement in community ambulation. Baseline: 03/09/2024: .97 m/s Goal status: INITIAL  2.  Pt will decrease worst back pain by at least 2 points  on the NPRS in order to demonstrate clinically significant reduction in back pain. Baseline: 03/09/2024: 10/10 NPS Goal status: INITIAL  3.  Pt will decrease mODI score by at least 13 points in order demonstrate clinically significant reduction in back pain/disability.       Baseline: 03/09/2024: 15/50 -30% Goal status: INITIAL  4.  Pt will increase R Hip IR to 35 in order to  demonstrate significant improvements in hip mobility and limb symmetry.  Baseline: 03/09/2024: R IR: 25 Goal status: INITIAL   PLAN:  PT FREQUENCY: 1-2x/week  PT DURATION: 8 weeks  PLANNED INTERVENTIONS: [id number]- PT Re-evaluation, 97110-Therapeutic exercises, 97530- Therapeutic activity, 97112- Neuromuscular re-education, 97535- Self Care, [id number]- Manual therapy, Joint mobilization, Cryotherapy, and Moist heat  PLAN FOR NEXT SESSION: Review HEP; initiate core stabilization, flexion-based exercises, supine core, hip strengthening   Lonni Pall PT, DPT Physical Therapist- [place]  03/17/2024, 12:07 PM

## 2024-03-19 ENCOUNTER — Ambulatory Visit

## 2024-03-19 DIAGNOSIS — M25511 Pain in right shoulder: Secondary | ICD-10-CM | POA: Diagnosis not present

## 2024-03-19 DIAGNOSIS — M5459 Other low back pain: Secondary | ICD-10-CM

## 2024-03-19 DIAGNOSIS — G8929 Other chronic pain: Secondary | ICD-10-CM | POA: Diagnosis not present

## 2024-03-19 DIAGNOSIS — M6281 Muscle weakness (generalized): Secondary | ICD-10-CM | POA: Diagnosis not present

## 2024-03-19 DIAGNOSIS — M25612 Stiffness of left shoulder, not elsewhere classified: Secondary | ICD-10-CM | POA: Diagnosis not present

## 2024-03-19 DIAGNOSIS — M25512 Pain in left shoulder: Secondary | ICD-10-CM | POA: Diagnosis not present

## 2024-03-19 DIAGNOSIS — R262 Difficulty in walking, not elsewhere classified: Secondary | ICD-10-CM | POA: Diagnosis not present

## 2024-03-19 NOTE — Therapy (Signed)
 OUTPATIENT PHYSICAL THERAPY THORACOLUMBAR TREATMENT   Patient Name: Krystal [name] MRN: [id number] DOB:May 24, 1951, 73 y.o., female Today's Date: 03/19/2024  END OF SESSION:  PT End of Session - 03/19/24 1118     Visit Number 5    Number of Visits 17    Date for PT Re-Evaluation 05/04/24    Authorization Type auth 20 visits    Authorization Time Period 6/5 - 8/29    Authorization - Visit Number 17    Authorization - Number of Visits 20    PT Start Time 1120    PT Stop Time 1200    PT Time Calculation (min) 40 min    Activity Tolerance Patient tolerated treatment well    Behavior During Therapy [facility] for tasks assessed/performed          Past Medical History:  Diagnosis Date   Anemia    Arthritis    osteo   Blindness of right eye at birth   Cancer [facility]) 2011   Left breast 2011 and cervical age 89   Carpal tunnel syndrome    Cataract    Depression    Edema 07/27/2011   Fibromyalgia    muscle weakness and pain   GERD (gastroesophageal reflux disease)    Hyperlipidemia    Hypertension    benign   Lymphedema    Osteoporosis    Plantar fasciitis    Pre-diabetes    Raynaud's disease    Restless leg syndrome    Rosacea    Rotator cuff rupture    Sleep apnea    Synovitis    Ulcer    Past Surgical History:  Procedure Laterality Date   ABDOMINAL HYSTERECTOMY  1986   For bleeding and pain   ATRIAL FIBRILLATION ABLATION N/A 05/14/2023   Procedure: ATRIAL FIBRILLATION ABLATION;  Surgeon: Cindie Ole DASEN, MD;  Location: [facility];  Service: Cardiovascular;  Laterality: N/A;   BLADDER SURGERY  2006   Bladder tack   BREAST RECONSTRUCTION Bilateral 09/17/2012   Procedure: BREAST RECONSTRUCTION;  Surgeon: Estefana Reichert, DO;  Location: [facility];  Service: Plastics;  Laterality: Bilateral;  BILATERAL BREAST RECONSTRUCTION WITH TISSUE EXPANDERS AND ALLOMED   BREAST RECONSTRUCTION Right 06/03/2013   Procedure: RIGHT BREAST CAPSULE CONSTRACTURE;   Surgeon: Estefana Reichert, DO;  Location: [facility];  Service: Plastics;  Laterality: Right;   CAPSULECTOMY Right 11/18/2023   Procedure: EXCISION OF RIGHT BREAST CAPSULE CONTRACTURE AND BREAST TISSUE;  Surgeon: Lowery Estefana RAMAN, DO;  Location: [facility];  Service: Plastics;  Laterality: Right;   CARDIOVERSION N/A 08/06/2022   Procedure: CARDIOVERSION;  Surgeon: Darron Deatrice LABOR, MD;  Location: [facility];  Service: Cardiovascular;  Laterality: N/A;   CARDIOVERSION N/A 10/02/2022   Procedure: CARDIOVERSION;  Surgeon: Mady Bruckner, MD;  Location: [facility];  Service: Cardiovascular;  Laterality: N/A;   CARPAL TUNNEL RELEASE Bilateral 2008   EYE SURGERY Right    INJECTION KNEE Left 08/12/2018   Procedure: KNEE INJECTION-LEFT;  Surgeon: Edie Norleen PARAS, MD;  Location: [facility];  Service: Orthopedics;  Laterality: Left;   JOINT REPLACEMENT     LIPOSUCTION Bilateral 01/29/2013   Procedure: LIPOSUCTION;  Surgeon: Estefana Reichert, DO;  Location: [facility];  Service: Plastics;  Laterality: Bilateral;   LIPOSUCTION Right 06/03/2013   Procedure: LIPOSUCTION;  Surgeon: Estefana Reichert, DO;  Location:  SURGERY CENTER;  Service: Plastics;  Laterality: Right;   LIPOSUCTION Right 11/18/2023   Procedure: LIPOSUCTION LATERAL RIGHT CHEST;  Surgeon: Lowery Estefana RAMAN, DO;  Location: [facility];  Service: Plastics;  Laterality: Right;   MASTECTOMY  2012   rt prophalactic mast-snbx   MASTECTOMY MODIFIED RADICAL  2012   left-axillary nodes   NEUROMA SURGERY Bilateral    NOSE SURGERY  2007   PORT-A-CATH REMOVAL     insertion and   REVERSE SHOULDER ARTHROPLASTY Left 01/16/2024   Procedure: ARTHROPLASTY, SHOULDER, TOTAL, REVERSE;  Surgeon: Melita Drivers, MD;  Location: [facility];  Service: Orthopedics;  Laterality: Left;   REVISION, RECONSTRUCTION, BREAST Right 11/18/2023   Procedure: REVISION RIGHT BREAST RECONSTRUCTION;  Surgeon: Lowery Estefana RAMAN, DO;  Location:  SURGERY CENTER;  Service: Plastics;  Laterality: Right;  Excision of scar, capsule contracture right breast , possible liposuction   THROAT SURGERY     TONSILLECTOMY     TOTAL KNEE ARTHROPLASTY Right 08/12/2018   Procedure: TOTAL KNEE ARTHROPLASTY-RIGHT;  Surgeon: Edie Norleen PARAS, MD;  Location: [facility];  Service: Orthopedics;  Laterality: Right;   TOTAL KNEE ARTHROPLASTY Left 09/22/2019   Procedure: TOTAL KNEE ARTHROPLASTY;  Surgeon: Edie Norleen PARAS, MD;  Location: [facility];  Service: Orthopedics;  Laterality: Left;   UVULOPALATOPHARYNGOPLASTY     Patient Active Problem List   Diagnosis Date Noted   Raynaud's disease 11/05/2022   Osteoporosis, post-menopausal 11/05/2022   Atrial flutter (HCC) 10/02/2022   First degree AV block 09/11/2022   Arthritis 08/08/2022   Persistent atrial fibrillation (HCC) 08/06/2022   Depression 02/27/2022   Gastroesophageal reflux disease 02/27/2022   Hypertension 02/27/2022   Abnormal LFTs 02/27/2022   Alcohol  abuse 02/27/2022   Atrial fibrillation with RVR (HCC) 02/27/2022   Morbid obesity (HCC) 02/27/2022   Pes anserinus bursitis of left knee 02/08/2020   Status post total knee replacement using cement, left 09/22/2019   Status post total knee replacement using cement, right 08/12/2018   Rotator cuff rupture    Status post bilateral breast reconstruction 02/06/2013   Acquired absence of bilateral breasts and nipples 09/17/2012   Breast cancer of lower-outer quadrant of left female breast (HCC) 01/16/2011    PCP: Valora Agent, MD  REFERRING PROVIDER: Bonner Ade, MD  REFERRING DIAG:  M54.16 (ICD-10-CM) - Radiculopathy, lumbar region   RATIONALE FOR EVALUATION AND TREATMENT: Rehabilitation  THERAPY DIAG: Other low back pain  Muscle weakness (generalized)  Difficulty in walking, not elsewhere classified  ONSET DATE: Chronic  FOLLOW-UP APPT SCHEDULED WITH REFERRING PROVIDER: Yes    SUBJECTIVE:  SUBJECTIVE STATEMENT:    Patient reporting to OPPT with a chief concern of lower back pain.   PERTINENT HISTORY:   Krystal [name] is a 73 y.o. female presenting to OPPT with chronic lower back pain. She is familiar to this clinic and has recently d/c from PT for reverse total shoulder replacement (L).  Patient reports that 8-9 mos ago she thought sciatic pain however she realized the pain was originating from the back. Patient reports death in family (father) 1 yr ago; she reports that she was the primary caregiver for two years. She reports that her lower back pain radiates into both of her legs (R > L); pain stops at the knees.   Aggravating factors: Prolonged sitting in a straight chair; reaching for weeds;  Relieving factors: Leaning forward, bending forward; tylenol    She denies saddle paraesthesia, changes in bowel/bladder, acute trauma to lumbar spine.    Imaging: (per chart review 03/09/2024):  Two-view lumbar spine X-ray, PA and lateral, performed in the office today and personally reviewed. The patient does not have any significant scoliosis. Minimal arthritis is noted in the hip joints, with no arthritis in the sacroiliac joints. The patient has five non-rib-bearing lumbar vertebrae. Grade 1 spondylolisthesis is observed at L3-4 and L4-5. No compression fractures are noted.    PAIN:    Pain Intensity: Present: 6/10, Best: 5/10, Worst: 8/10 Pain location: R Lower back with intermittent L involvement  Pain Quality: constant, burning, and real hot  Radiating: Yes  Numbness/Tingling: No  PRECAUTIONS: None  WEIGHT BEARING RESTRICTIONS: No  FALLS: Has patient fallen in last 6 months? No  Living Environment Lives with: lives with their spouse Lives in: House/apartment Stairs: Yes: Internal: 13 steps; on right  going up Has following equipment at home: None  Prior level of function: Independent  Occupational demands: Retired  Hobbies: 75 yr old dog; Cleaning  Patient Goals: I would like to stand up straight without pain    OBJECTIVE:  Patient Surveys  mODI: 15/50 - 30%  Cognition WNL    Gross Musculoskeletal Assessment Tremor: None Bulk: Normal Tone: Normal No visible step-off along spinal column, no signs of scoliosis  GAIT: Distance walked: 18m  Assistive device utilized: None Level of assistance: Complete Independence Comments: Forward Trunk Lean  Posture: Lumbar lordosis: WNL Iliac crest height: Equal bilaterally Lumbar lateral shift: Negative  AROM AROM (Normal range in degrees) AROM   Lumbar   Flexion (65) 100%  Extension (30) 100%  Right lateral flexion (25) 100%   Left lateral flexion (25) 100%  Right rotation (30) 100%   Left rotation (30) 100%       Hip Right Left  Flexion (125) 110 110  Extension (15)    Abduction (40)    Adduction     Internal Rotation (45) 25 35  External Rotation (45) 30 30      Knee    Flexion (135) WNL WNL  Extension (0) WNL WNL      Ankle    Dorsiflexion (20) WNL WNL  Plantarflexion (50)    Inversion (35)    Eversion (15)    (* = pain; Blank rows = not tested)  LE MMT: MMT (out of 5) Right  Left   Hip flexion 4- 4-  Hip extension    Hip abduction 4- 4-  Hip adduction    Hip internal rotation 4+ 4+  Hip external rotation 4+ 4+  Knee flexion 5 5  Knee extension 5 5  Ankle dorsiflexion  5 5  Ankle plantarflexion 5 5  Ankle inversion    Ankle eversion    (* = pain; Blank rows = not tested)  Sensation Grossly intact to light touch throughout bilateral LEs as determined by testing dermatomes L2-S2. Proprioception, stereognosis, and hot/cold testing deferred on this date.  Reflexes Deferred   Muscle Length Hamstrings: R: Negative L: Negative Thomas (hip flexors): R: Not examined L: Not  examined  Palpation Location Right Left         Lumbar paraspinals 2 1  Quadratus Lumborum    Gluteus Maximus 2 1  Gluteus Medius    Deep hip external rotators 0 0  PSIS 0 0  Fortin's Area (SIJ)    Greater Trochanter 0 0  (Blank rows = not tested) Graded on 0-4 scale (0 = no pain, 1 = pain, 2 = pain with wincing/grimacing/flinching, 3 = pain with withdrawal, 4 = unwilling to allow palpation)  Passive Accessory  Motion Pt denies reproduction of back pain with CPA L1-L5 and UPA bilaterally L1-L5. Generally, hypomobile throughout  Special Tests Lumbar Radiculopathy and Discogenic: Centralization and Peripheralization (SN 92, -LR 0.12): Not examined Slump (SN 83, -LR 0.32): R: Not examined L: Not examined SLR (SN 92, -LR 0.29): R: Negative L:  Negative Crossed SLR (SP 90): R: Negative L: Negative  Facet Joint: Extension-Rotation (SN 100, -LR 0.0): R: Positive L: Not examined  Lumbar Foraminal Stenosis: Lumbar quadrant (SN 70): R: Positive L: Not examined  Hip: FABER (SN 81): R: Not examined L: Not examined FADIR (SN 94): R: Not examined L: Not examined Hip scour (SN 50): R: Not examined L: Not examined  TODAY'S TREATMENT: DATE: 03/19/24  Subjective: Patient reports 3-5/10 NPS in the lower back; she took an oxycodone  prior to PT session. She reports moderate to severe lower back and L shoulder yesterday; she admits performing a lot of house work and weeding.  No questions or concerns.    Therapeutic Exercise (focused on strengthening core stability through abdominal/hip muscles):   Supine Glute Bridge with 3Kg Med Ball Stomach 4 x 10 reps    Supine 90/90 - Single LE extension for core stabilization  3 x 10 - Alternating LE  Supine 90/90 isometric with Pallof Press for core stabilization   3 x 10 - Green TB   Hooklying Hip Abduction against TB for core stabilization   3 x 10 - Grey TB   Seated Forward Physioball Rollout for improved lumbar mobility  2 x 10    Therapeutic Activity (improving multiple parameters/joints in order to optimize household ADLs):   Debe Edison Squats    3 x 10 - 10# KB    Good hip hinge mechanics and without lower back pain  Lateral Stepping against resistance  4 x 12' - Green TB   4 x 12' - Green TB   Squat with [place] over Row in order to optimize weeding at home   R/L: 2 x 10 - Red TB   PATIENT EDUCATION:  Education details: Exercise Technique, HEP Person educated: Patient Education method: Explanation, Demonstration, and Handouts Education comprehension: verbalized understanding and returned demonstration   HOME EXERCISE PROGRAM:  Access Code: [email or web address] URL: [email or web address] Date: 03/17/2024 Prepared by: Lonni Pall  Exercises - Supine Bilateral Hip Internal Rotation Stretch  - 1 x daily - 7 x weekly - 2-3 sets - 30s hold - Supine Lower Trunk Rotation  - 1 x daily - 7 x weekly - 2-3 sets - 10 reps - Supine Bridge  -  1 x daily - 3-4 x weekly - 2-3 sets - 10-12 reps - Supine Double Bent Leg Lift  - 1 x daily - 3-4 x weekly - 2-3 sets - 10-12 reps - Supine 90/90 Overhead Dumbbell Raise  - 1 x daily - 3-4 x weekly - 2-3 sets - 10 reps - Supine 90/90 with Leg Extensions  - 1 x daily - 3-4 x weekly - 2-3 sets - 10 reps - Standing Posterior Pelvic Tilt  - 1 x daily - 3-4 x weekly - 2-3 sets - 10-12 reps  Access Code: [email or web address] URL: [email or web address] Date: 03/09/2024 Prepared by: Lonni Clariece Roesler  Exercises - Supine Bilateral Hip Internal Rotation Stretch  - 1 x daily - 7 x weekly - 2-3 sets - 30s hold - Supine Lower Trunk Rotation  - 1 x daily - 7 x weekly - 2-3 sets - 10 reps - Supine Bridge  - 1 x daily - 3-4 x weekly - 2-3 sets - 10-12 reps - Supine Double Bent Leg Lift  - 1 x daily - 3-4 x weekly - 2-3 sets - 10-12 reps   ASSESSMENT:  CLINICAL IMPRESSION: Continued PT POC focused on core strengthening for improved lumbar stability. Functional activities  focused on increasing LE strength in order to improve household ADLs. Good demonstration with weeding technique during squat phase but required multimodal cues in order perform proper sequencing with UE row. She tolerated all activities today without adverse effects to lower back or L shoulder. Patient's pain is still consistently moderate to severe associated with overuse with household activities, yardwork, and cleaning. PT strongly encouraged reduction in household work and activities in order to reduce further injury to recent Reverse TSR and lower back pain.  Based on today's performance, pt will continue to benefit from skilled PT in order to facilitate return to PLOF and improve QoL.   OBJECTIVE IMPAIRMENTS: decreased activity tolerance, decreased ROM, decreased strength, and pain.   ACTIVITY LIMITATIONS: lifting, bending, standing, squatting, transfers, and caring for others  PARTICIPATION LIMITATIONS: driving and yard work  PERSONAL FACTORS: Age, Behavior pattern, Past/current experiences, Time since onset of injury/illness/exacerbation, and 1-2 comorbidities: s/p L Reverse TSR, s/p bilateral TKR  are also affecting patient's functional outcome.   REHAB POTENTIAL: Fair Chronicity  CLINICAL DECISION MAKING: Evolving/moderate complexity  EVALUATION COMPLEXITY: Moderate   GOALS: Goals reviewed with patient? No  SHORT TERM GOALS: Target date: 04/16/2024  Pt will be independent with HEP in order to improve strength and decrease back pain to improve pain-free function at home and work. Baseline: 03/09/2024: initial HEP Goal status: INITIAL   LONG TERM GOALS: Target date: 05/14/2024  Pt will increase by at least 1.1 m/s in order to demonstrate clinically significant improvement in community ambulation. Baseline: 03/09/2024: .97 m/s Goal status: INITIAL  2.  Pt will decrease worst back pain by at least 2 points on the NPRS in order to demonstrate clinically significant  reduction in back pain. Baseline: 03/09/2024: 10/10 NPS Goal status: INITIAL  3.  Pt will decrease mODI score by at least 13 points in order demonstrate clinically significant reduction in back pain/disability.       Baseline: 03/09/2024: 15/50 -30% Goal status: INITIAL  4.  Pt will increase R Hip IR to 35 in order to demonstrate significant improvements in hip mobility and limb symmetry.  Baseline: 03/09/2024: R IR: 25 Goal status: INITIAL   PLAN:  PT FREQUENCY: 1-2x/week  PT DURATION: 8 weeks  PLANNED INTERVENTIONS: 02835- PT Re-evaluation, 97110-Therapeutic  exercises, 97530- Therapeutic activity, [id number]- Neuromuscular re-education, 97535- Self Care, [id number]- Manual therapy, Joint mobilization, Cryotherapy, and Moist heat  PLAN FOR NEXT SESSION: Review HEP; initiate core stabilization, flexion-based exercises, supine core, hip strengthening   Lonni Pall PT, DPT Physical Therapist- [place]  03/19/2024, 11:29 AM

## 2024-03-24 ENCOUNTER — Encounter: Payer: Self-pay | Admitting: Cardiovascular Disease

## 2024-03-24 ENCOUNTER — Ambulatory Visit: Attending: Cardiovascular Disease | Admitting: Cardiovascular Disease

## 2024-03-24 VITALS — BP 150/70 | HR 87 | Ht 63.0 in | Wt 165.2 lb

## 2024-03-24 DIAGNOSIS — I4819 Other persistent atrial fibrillation: Secondary | ICD-10-CM

## 2024-03-24 DIAGNOSIS — I10 Essential (primary) hypertension: Secondary | ICD-10-CM | POA: Diagnosis not present

## 2024-03-24 DIAGNOSIS — Z01810 Encounter for preprocedural cardiovascular examination: Secondary | ICD-10-CM | POA: Diagnosis not present

## 2024-03-24 NOTE — Progress Notes (Unsigned)
 Cardiology Office Note   Date:  03/24/2024   ID:  [name], Krystal [name] 28, 1952, MRN [id number]  PCP:  Valora Agent, MD  Cardiologist:   Deatrice Cage, MD   Chief Complaint  Patient presents with   Follow-up    2 month f/u no complaints today. Meds reviewed verbally with pt.      History of Present Illness: Krystal [name] is a 73 y.o. female who is here today for a follow-up visit regarding persistent atrial fibrillation and essential hypertension.  She is status post A-fib/flutter ablation by Dr. Cindie in October 2024.  She reports good control of symptoms since then.  Other medical problems include essential hypertension, hyperlipidemia, s breast cancer status post bilateral mastectomy and chemotherapy, GERD, depression, fibromyalgia, arthritis, obstructive sleep apnea on CPAP and lymphedema. She is a lifelong non-smoker.  She had a Lexiscan  Myoview  done in October 2023 which showed no evidence of ischemia with normal ejection fraction.  She has been doing well with no chest pain, shortness of breath or palpitations.  No issues with anticoagulation.  She was seen by Dr. Cindie in July and her blood pressure was elevated.  Losartan  was increased to 50 mg daily.  Past Medical History:  Diagnosis Date   Anemia    Arthritis    osteo   Blindness of right eye at birth   Cancer [facility]) 2011   Left breast 2011 and cervical age 89   Carpal tunnel syndrome    Cataract    Depression    Edema 07/27/2011   Fibromyalgia    muscle weakness and pain   GERD (gastroesophageal reflux disease)    Hyperlipidemia    Hypertension    benign   Lymphedema    Osteoporosis    Plantar fasciitis    Pre-diabetes    Raynaud's disease    Restless leg syndrome    Rosacea    Rotator cuff rupture    Sleep apnea    Synovitis    Ulcer     Past Surgical History:  Procedure Laterality Date   ABDOMINAL HYSTERECTOMY  1986   For bleeding and pain   ATRIAL FIBRILLATION ABLATION N/A  05/14/2023   Procedure: ATRIAL FIBRILLATION ABLATION;  Surgeon: Cindie Ole DASEN, MD;  Location: [facility];  Service: Cardiovascular;  Laterality: N/A;   BLADDER SURGERY  2006   Bladder tack   BREAST RECONSTRUCTION Bilateral 09/17/2012   Procedure: BREAST RECONSTRUCTION;  Surgeon: Estefana Reichert, DO;  Location: [facility];  Service: Plastics;  Laterality: Bilateral;  BILATERAL BREAST RECONSTRUCTION WITH TISSUE EXPANDERS AND ALLOMED   BREAST RECONSTRUCTION Right 06/03/2013   Procedure: RIGHT BREAST CAPSULE CONSTRACTURE;  Surgeon: Estefana Reichert, DO;  Location: [facility];  Service: Plastics;  Laterality: Right;   CAPSULECTOMY Right 11/18/2023   Procedure: EXCISION OF RIGHT BREAST CAPSULE CONTRACTURE AND BREAST TISSUE;  Surgeon: Lowery Estefana RAMAN, DO;  Location:  SURGERY CENTER;  Service: Plastics;  Laterality: Right;   CARDIOVERSION N/A 08/06/2022   Procedure: CARDIOVERSION;  Surgeon: Cage Deatrice LABOR, MD;  Location: [facility];  Service: Cardiovascular;  Laterality: N/A;   CARDIOVERSION N/A 10/02/2022   Procedure: CARDIOVERSION;  Surgeon: Mady Bruckner, MD;  Location: [facility];  Service: Cardiovascular;  Laterality: N/A;   CARPAL TUNNEL RELEASE Bilateral 2008   EYE SURGERY Right    INJECTION KNEE Left 08/12/2018   Procedure: KNEE INJECTION-LEFT;  Surgeon: Edie Norleen PARAS, MD;  Location: [facility];  Service: Orthopedics;  Laterality:  Left;   JOINT REPLACEMENT     LIPOSUCTION Bilateral 01/29/2013   Procedure: LIPOSUCTION;  Surgeon: Estefana Reichert, DO;  Location: [facility];  Service: Plastics;  Laterality: Bilateral;   LIPOSUCTION Right 06/03/2013   Procedure: LIPOSUCTION;  Surgeon: Estefana Reichert, DO;  Location: [facility];  Service: Plastics;  Laterality: Right;   LIPOSUCTION Right 11/18/2023   Procedure: LIPOSUCTION LATERAL RIGHT CHEST;  Surgeon: Lowery Estefana RAMAN, DO;  Location: [facility];  Service:  Plastics;  Laterality: Right;   MASTECTOMY  2012   rt prophalactic mast-snbx   MASTECTOMY MODIFIED RADICAL  2012   left-axillary nodes   NEUROMA SURGERY Bilateral    NOSE SURGERY  2007   PORT-A-CATH REMOVAL     insertion and   REVERSE SHOULDER ARTHROPLASTY Left 01/16/2024   Procedure: ARTHROPLASTY, SHOULDER, TOTAL, REVERSE;  Surgeon: Melita Drivers, MD;  Location: [facility];  Service: Orthopedics;  Laterality: Left;   REVISION, RECONSTRUCTION, BREAST Right 11/18/2023   Procedure: REVISION RIGHT BREAST RECONSTRUCTION;  Surgeon: Lowery Estefana RAMAN, DO;  Location: [facility];  Service: Plastics;  Laterality: Right;  Excision of scar, capsule contracture right breast , possible liposuction   THROAT SURGERY     TONSILLECTOMY     TOTAL KNEE ARTHROPLASTY Right 08/12/2018   Procedure: TOTAL KNEE ARTHROPLASTY-RIGHT;  Surgeon: Edie Norleen PARAS, MD;  Location: [facility];  Service: Orthopedics;  Laterality: Right;   TOTAL KNEE ARTHROPLASTY Left 09/22/2019   Procedure: TOTAL KNEE ARTHROPLASTY;  Surgeon: Edie Norleen PARAS, MD;  Location: [facility];  Service: Orthopedics;  Laterality: Left;   UVULOPALATOPHARYNGOPLASTY       Current Outpatient Medications  Medication Sig Dispense Refill   alendronate  (FOSAMAX ) 70 MG tablet Take 70 mg by mouth every Saturday. Take with a full glass of water on an empty stomach.     amoxicillin (AMOXIL) 500 MG capsule Take 2,000 mg by mouth See admin instructions. Take 4 capsules (2000 mg) by mouth 1 hour prior to dental procedures     apixaban  (ELIQUIS ) 5 MG TABS tablet Take 1 tablet (5 mg total) by mouth 2 (two) times daily. 60 tablet 5   cyclobenzaprine  (FLEXERIL ) 10 MG tablet Take 1 tablet (10 mg total) by mouth 3 (three) times daily as needed for muscle spasms. 30 tablet 1   diltiazem  (CARDIZEM  CD) 120 MG 24 hr capsule TAKE 1 CAPSULE IN THE MORNING AND AT BEDTIME 180 capsule 3   DULoxetine (CYMBALTA) 60 MG capsule Take 60 mg by mouth in the morning.     fluticasone   (FLONASE ) 50 MCG/ACT nasal spray Place 2 sprays into both nostrils in the morning.     furosemide  (LASIX ) 20 MG tablet Take 20 mg by mouth 2 (two) times daily.     levocetirizine (XYZAL) 5 MG tablet Take 5 mg by mouth every evening.     losartan  (COZAAR ) 50 MG tablet Take 1 tablet (50 mg total) by mouth daily. 90 tablet 3   LYSINE ACETATE PO Take 1,000 mg by mouth daily as needed (fever blister).     magnesium  oxide (MAG-OX) 400 (240 Mg) MG tablet Take 400 mg by mouth once a week.     meloxicam (MOBIC) 7.5 MG tablet Take 7.5 mg by mouth every evening.     montelukast (SINGULAIR) 10 MG tablet Take 1 tablet by mouth at bedtime.     neomycin-polymyxin b -dexamethasone  (MAXITROL) 3.5-10000-0.1 SUSP Place 1 drop into both eyes in the morning and at bedtime.  omeprazole (PRILOSEC) 20 MG capsule Take 20 mg by mouth in the morning and at bedtime.     ondansetron  (ZOFRAN ) 4 MG tablet Take 1 tablet (4 mg total) by mouth every 8 (eight) hours as needed for nausea or vomiting. 10 tablet 0   oxyCODONE -acetaminophen  (PERCOCET) 5-325 MG tablet Take 1 tablet by mouth every 4 (four) hours as needed (max 6 q). 20 tablet 0   potassium chloride  SA (KLOR-CON  M) 20 MEQ tablet Take 1 tablet (20 mEq total) by mouth 2 (two) times daily. 180 tablet 3   pregabalin  (LYRICA ) 300 MG capsule Take 300 mg by mouth 2 (two) times daily.     rOPINIRole  (REQUIP ) 1 MG tablet Take 1 mg by mouth at bedtime.     triamcinolone  (KENALOG ) 0.025 % cream Apply 1 Application topically as needed (flare - up).     Vibegron  (GEMTESA ) 75 MG TABS Take 1 tablet (75 mg total) by mouth daily. 90 tablet 3   Vitamin D , Ergocalciferol , (DRISDOL) 1.25 MG (50000 UNIT) CAPS capsule Take 50,000 Units by mouth every Saturday.     zolpidem  (AMBIEN ) 10 MG tablet Take 10 mg by mouth at bedtime.     No current facility-administered medications for this visit.    Allergies:   Cephalexin  and Levaquin [levofloxacin]    Social History:  The patient  reports  that she has never smoked. She has never been exposed to tobacco smoke. She has never used smokeless tobacco. She reports current alcohol  use. She reports that she does not use drugs.   Family History:  The patient's family history includes Breast cancer in her maternal aunt and maternal aunt; Cancer in her cousin, cousin, father, maternal grandfather, and maternal grandmother; Cancer (age of onset: 43) in her sister; Cancer (age of onset: 23) in her mother; Hypertension in her brother and sister; Liver cancer in her maternal uncle; Lung cancer in her father and maternal uncle; Throat cancer in her father and maternal uncle.    ROS:  Please see the history of present illness.   Otherwise, review of systems are positive for none.   All other systems are reviewed and negative.    PHYSICAL EXAM: VS:  BP (!) 150/70 (BP Location: Left Arm, Patient Position: Sitting, Cuff Size: Normal)   Pulse 87   Ht 5' 3 (1.6 m)   Wt 165 lb 4 oz (75 kg)   SpO2 98%   BMI 29.27 kg/m  , BMI Body mass index is 29.27 kg/m. GEN: Well nourished, well developed, in no acute distress  HEENT: normal  Neck: no JVD, carotid bruits, or masses Cardiac: RRR; no murmurs, rubs, or gallops,no edema  Respiratory:  clear to auscultation bilaterally, normal work of breathing GI: soft, nontender, nondistended, + BS MS: no deformity or atrophy  Skin: warm and dry, no rash Neuro:  Strength and sensation are intact Psych: euthymic mood, full affect   EKG:  EKG is ordered today. The ekg ordered today demonstrates normal sinus rhythm with low voltage.   Recent Labs: 06/12/2023: ALT 16; BNP 29.6; TSH 2.530 01/15/2024: BUN 17; Creatinine, Ser 1.03; Hemoglobin 12.8; Platelets 255; Potassium 4.2; Sodium 141    Lipid Panel    Component Value Date/Time   CHOL 197 04/03/2019 1026   TRIG 116 04/03/2019 1026   HDL 50 04/03/2019 1026   CHOLHDL 3.9 04/03/2019 1026   VLDL 23 04/03/2019 1026   LDLCALC 124 (H) 04/03/2019 1026       Wt Readings from Last 3  Encounters:  03/24/24 165 lb 4 oz (75 kg)  02/19/24 161 lb 12.8 oz (73.4 kg)  01/16/24 160 lb 1.6 oz (72.6 kg)          04/24/2022    2:24 PM  PAD Screen  Previous PAD dx? Yes  Previous surgical procedure? No  Pain with walking? No  Feet/toe relief with dangling? No  Painful, non-healing ulcers? No  Extremities discolored? No      ASSESSMENT AND PLAN:  1.  Persistent atrial fibrillation: Status post successful ablation with excellent results.   CHA2DS2-VASc score is 3.  I discussed the rationale for continued long-term anticoagulation.  She hates anticoagulation in general and might be interested in atrial appendage occlusion in the future.    2.  Preop cardiovascular evaluation for possible back surgery or injection: She is at low risk from a cardiac standpoint.  Eliquis  can be held 3 days before.  3.  Essential hypertension: Blood pressure is still mildly elevated but improved since losartan  was increased to 50 mg daily.  Can consider increasing this further to 100 mg daily if blood pressure remains elevated.    Disposition:    Follow-up with me in 1 year.  Signed,  Deatrice Cage, MD  03/24/2024 3:30 PM    [facility]

## 2024-03-24 NOTE — Patient Instructions (Signed)
 Medication Instructions:  No changes *If you need a refill on your cardiac medications before your next appointment, please call your pharmacy*  Lab Work: None ordered If you have labs (blood work) drawn today and your tests are completely normal, you will receive your results only by: MyChart Message (if you have MyChart) OR A paper copy in the mail If you have any lab test that is abnormal or we need to change your treatment, we will call you to review the results.  Testing/Procedures: None ordered  Follow-Up: At [facility], you and your health needs are our priority.  As part of our continuing mission to provide you with exceptional heart care, our providers are all part of one team.  This team includes your primary Cardiologist (physician) and Advanced Practice Providers or APPs (Physician Assistants and Nurse Practitioners) who all work together to provide you with the care you need, when you need it.  Your next appointment:   12 month(s)  Provider:   You may see Lorine Bears, MD or one of the following Advanced Practice Providers on your designated Care Team:   Nicolasa Ducking, NP Ames Dura, PA-C Eula Listen, PA-C Cadence [place], PA-C Charlsie Quest, NP Carlos Levering, NP    We recommend signing up for the patient portal called "MyChart".  Sign up information is provided on this After Visit Summary.  MyChart is used to connect with patients for Virtual Visits (Telemedicine).  Patients are able to view lab/test results, encounter notes, upcoming appointments, etc.  Non-urgent messages can be sent to your provider as well.   To learn more about what you can do with MyChart, go to [email or web address].

## 2024-03-25 ENCOUNTER — Ambulatory Visit: Attending: Physical Medicine and Rehabilitation

## 2024-03-25 DIAGNOSIS — R262 Difficulty in walking, not elsewhere classified: Secondary | ICD-10-CM | POA: Insufficient documentation

## 2024-03-25 DIAGNOSIS — M6281 Muscle weakness (generalized): Secondary | ICD-10-CM | POA: Diagnosis not present

## 2024-03-25 DIAGNOSIS — M5459 Other low back pain: Secondary | ICD-10-CM | POA: Diagnosis not present

## 2024-03-25 NOTE — Therapy (Signed)
 OUTPATIENT PHYSICAL THERAPY THORACOLUMBAR TREATMENT   Patient Name: Krystal [name] MRN: [id number] DOB:05/16/1951, 73 y.o., female Today's Date: 03/25/2024  END OF SESSION:  PT End of Session - 03/25/24 1520     Visit Number 6    Number of Visits 17    Date for PT Re-Evaluation 05/04/24    Authorization Type auth 20 visits    Authorization Time Period 6/5 - 8/29    Authorization - Number of Visits 20    PT Start Time 1520    PT Stop Time 1600    PT Time Calculation (min) 40 min    Activity Tolerance Patient tolerated treatment well    Behavior During Therapy [facility] for tasks assessed/performed          Past Medical History:  Diagnosis Date   Anemia    Arthritis    osteo   Blindness of right eye at birth   Cancer [facility]) 2011   Left breast 2011 and cervical age 89   Carpal tunnel syndrome    Cataract    Depression    Edema 07/27/2011   Fibromyalgia    muscle weakness and pain   GERD (gastroesophageal reflux disease)    Hyperlipidemia    Hypertension    benign   Lymphedema    Osteoporosis    Plantar fasciitis    Pre-diabetes    Raynaud's disease    Restless leg syndrome    Rosacea    Rotator cuff rupture    Sleep apnea    Synovitis    Ulcer    Past Surgical History:  Procedure Laterality Date   ABDOMINAL HYSTERECTOMY  1986   For bleeding and pain   ATRIAL FIBRILLATION ABLATION N/A 05/14/2023   Procedure: ATRIAL FIBRILLATION ABLATION;  Surgeon: Cindie Ole DASEN, MD;  Location: [facility];  Service: Cardiovascular;  Laterality: N/A;   BLADDER SURGERY  2006   Bladder tack   BREAST RECONSTRUCTION Bilateral 09/17/2012   Procedure: BREAST RECONSTRUCTION;  Surgeon: Estefana Reichert, DO;  Location: [facility];  Service: Plastics;  Laterality: Bilateral;  BILATERAL BREAST RECONSTRUCTION WITH TISSUE EXPANDERS AND ALLOMED   BREAST RECONSTRUCTION Right 06/03/2013   Procedure: RIGHT BREAST CAPSULE CONSTRACTURE;  Surgeon: Estefana Reichert, DO;  Location:  [facility];  Service: Plastics;  Laterality: Right;   CAPSULECTOMY Right 11/18/2023   Procedure: EXCISION OF RIGHT BREAST CAPSULE CONTRACTURE AND BREAST TISSUE;  Surgeon: Lowery Estefana RAMAN, DO;  Location: [facility];  Service: Plastics;  Laterality: Right;   CARDIOVERSION N/A 08/06/2022   Procedure: CARDIOVERSION;  Surgeon: Darron Deatrice LABOR, MD;  Location: [facility];  Service: Cardiovascular;  Laterality: N/A;   CARDIOVERSION N/A 10/02/2022   Procedure: CARDIOVERSION;  Surgeon: Mady Bruckner, MD;  Location: [facility];  Service: Cardiovascular;  Laterality: N/A;   CARPAL TUNNEL RELEASE Bilateral 2008   EYE SURGERY Right    INJECTION KNEE Left 08/12/2018   Procedure: KNEE INJECTION-LEFT;  Surgeon: Edie Norleen PARAS, MD;  Location: [facility];  Service: Orthopedics;  Laterality: Left;   JOINT REPLACEMENT     LIPOSUCTION Bilateral 01/29/2013   Procedure: LIPOSUCTION;  Surgeon: Estefana Reichert, DO;  Location: [facility];  Service: Plastics;  Laterality: Bilateral;   LIPOSUCTION Right 06/03/2013   Procedure: LIPOSUCTION;  Surgeon: Estefana Reichert, DO;  Location: [facility];  Service: Plastics;  Laterality: Right;   LIPOSUCTION Right 11/18/2023   Procedure: LIPOSUCTION LATERAL RIGHT CHEST;  Surgeon: Lowery Estefana RAMAN, DO;  Location:  [facility];  Service: Plastics;  Laterality: Right;   MASTECTOMY  2012   rt prophalactic mast-snbx   MASTECTOMY MODIFIED RADICAL  2012   left-axillary nodes   NEUROMA SURGERY Bilateral    NOSE SURGERY  2007   PORT-A-CATH REMOVAL     insertion and   REVERSE SHOULDER ARTHROPLASTY Left 01/16/2024   Procedure: ARTHROPLASTY, SHOULDER, TOTAL, REVERSE;  Surgeon: Melita Drivers, MD;  Location: [facility];  Service: Orthopedics;  Laterality: Left;   REVISION, RECONSTRUCTION, BREAST Right 11/18/2023   Procedure: REVISION RIGHT BREAST RECONSTRUCTION;  Surgeon: Lowery Estefana RAMAN, DO;  Location: [facility];  Service: Plastics;  Laterality: Right;  Excision of scar, capsule contracture right breast , possible liposuction   THROAT SURGERY     TONSILLECTOMY     TOTAL KNEE ARTHROPLASTY Right 08/12/2018   Procedure: TOTAL KNEE ARTHROPLASTY-RIGHT;  Surgeon: Edie Norleen PARAS, MD;  Location: [facility];  Service: Orthopedics;  Laterality: Right;   TOTAL KNEE ARTHROPLASTY Left 09/22/2019   Procedure: TOTAL KNEE ARTHROPLASTY;  Surgeon: Edie Norleen PARAS, MD;  Location: [facility];  Service: Orthopedics;  Laterality: Left;   UVULOPALATOPHARYNGOPLASTY     Patient Active Problem List   Diagnosis Date Noted   Raynaud's disease 11/05/2022   Osteoporosis, post-menopausal 11/05/2022   Atrial flutter (HCC) 10/02/2022   First degree AV block 09/11/2022   Arthritis 08/08/2022   Persistent atrial fibrillation (HCC) 08/06/2022   Depression 02/27/2022   Gastroesophageal reflux disease 02/27/2022   Hypertension 02/27/2022   Abnormal LFTs 02/27/2022   Alcohol  abuse 02/27/2022   Atrial fibrillation with RVR (HCC) 02/27/2022   Morbid obesity (HCC) 02/27/2022   Pes anserinus bursitis of left knee 02/08/2020   Status post total knee replacement using cement, left 09/22/2019   Status post total knee replacement using cement, right 08/12/2018   Rotator cuff rupture    Status post bilateral breast reconstruction 02/06/2013   Acquired absence of bilateral breasts and nipples 09/17/2012   Breast cancer of lower-outer quadrant of left female breast (HCC) 01/16/2011    PCP: Valora Agent, MD  REFERRING PROVIDER: Bonner Ade, MD  REFERRING DIAG:  M54.16 (ICD-10-CM) - Radiculopathy, lumbar region   RATIONALE FOR EVALUATION AND TREATMENT: Rehabilitation  THERAPY DIAG: Other low back pain  Muscle weakness (generalized)  ONSET DATE: Chronic  FOLLOW-UP APPT SCHEDULED WITH REFERRING PROVIDER: Yes    SUBJECTIVE:                                                                                                                                                                                          SUBJECTIVE STATEMENT:    Patient  reporting to OPPT with a chief concern of lower back pain.   PERTINENT HISTORY:   Krystal [name] is a 73 y.o. female presenting to OPPT with chronic lower back pain. She is familiar to this clinic and has recently d/c from PT for reverse total shoulder replacement (L).  Patient reports that 8-9 mos ago she thought sciatic pain however she realized the pain was originating from the back. Patient reports death in family (father) 1 yr ago; she reports that she was the primary caregiver for two years. She reports that her lower back pain radiates into both of her legs (R > L); pain stops at the knees.   Aggravating factors: Prolonged sitting in a straight chair; reaching for weeds;  Relieving factors: Leaning forward, bending forward; tylenol    She denies saddle paraesthesia, changes in bowel/bladder, acute trauma to lumbar spine.    Imaging: (per chart review 03/09/2024):  Two-view lumbar spine X-ray, PA and lateral, performed in the office today and personally reviewed. The patient does not have any significant scoliosis. Minimal arthritis is noted in the hip joints, with no arthritis in the sacroiliac joints. The patient has five non-rib-bearing lumbar vertebrae. Grade 1 spondylolisthesis is observed at L3-4 and L4-5. No compression fractures are noted.    PAIN:    Pain Intensity: Present: 6/10, Best: 5/10, Worst: 8/10 Pain location: R Lower back with intermittent L involvement  Pain Quality: constant, burning, and real hot  Radiating: Yes  Numbness/Tingling: No  PRECAUTIONS: None  WEIGHT BEARING RESTRICTIONS: No  FALLS: Has patient fallen in last 6 months? No  Living Environment Lives with: lives with their spouse Lives in: House/apartment Stairs: Yes: Internal: 13 steps; on right going up Has following equipment at home: None  Prior level of function:  Independent  Occupational demands: Retired  Hobbies: 81 yr old dog; Cleaning  Patient Goals: I would like to stand up straight without pain    OBJECTIVE:  Patient Surveys  mODI: 15/50 - 30%  Cognition WNL    Gross Musculoskeletal Assessment Tremor: None Bulk: Normal Tone: Normal No visible step-off along spinal column, no signs of scoliosis  GAIT: Distance walked: 75m  Assistive device utilized: None Level of assistance: Complete Independence Comments: Forward Trunk Lean  Posture: Lumbar lordosis: WNL Iliac crest height: Equal bilaterally Lumbar lateral shift: Negative  AROM AROM (Normal range in degrees) AROM   Lumbar   Flexion (65) 100%  Extension (30) 100%  Right lateral flexion (25) 100%   Left lateral flexion (25) 100%  Right rotation (30) 100%   Left rotation (30) 100%       Hip Right Left  Flexion (125) 110 110  Extension (15)    Abduction (40)    Adduction     Internal Rotation (45) 25 35  External Rotation (45) 30 30      Knee    Flexion (135) WNL WNL  Extension (0) WNL WNL      Ankle    Dorsiflexion (20) WNL WNL  Plantarflexion (50)    Inversion (35)    Eversion (15)    (* = pain; Blank rows = not tested)  LE MMT: MMT (out of 5) Right  Left   Hip flexion 4- 4-  Hip extension    Hip abduction 4- 4-  Hip adduction    Hip internal rotation 4+ 4+  Hip external rotation 4+ 4+  Knee flexion 5 5  Knee extension 5 5  Ankle dorsiflexion 5 5  Ankle plantarflexion 5  5  Ankle inversion    Ankle eversion    (* = pain; Blank rows = not tested)  Sensation Grossly intact to light touch throughout bilateral LEs as determined by testing dermatomes L2-S2. Proprioception, stereognosis, and hot/cold testing deferred on this date.  Reflexes Deferred   Muscle Length Hamstrings: R: Negative L: Negative Thomas (hip flexors): R: Not examined L: Not examined  Palpation Location Right Left         Lumbar paraspinals 2 1  Quadratus  Lumborum    Gluteus Maximus 2 1  Gluteus Medius    Deep hip external rotators 0 0  PSIS 0 0  Fortin's Area (SIJ)    Greater Trochanter 0 0  (Blank rows = not tested) Graded on 0-4 scale (0 = no pain, 1 = pain, 2 = pain with wincing/grimacing/flinching, 3 = pain with withdrawal, 4 = unwilling to allow palpation)  Passive Accessory  Motion Pt denies reproduction of back pain with CPA L1-L5 and UPA bilaterally L1-L5. Generally, hypomobile throughout  Special Tests Lumbar Radiculopathy and Discogenic: Centralization and Peripheralization (SN 92, -LR 0.12): Not examined Slump (SN 83, -LR 0.32): R: Not examined L: Not examined SLR (SN 92, -LR 0.29): R: Negative L:  Negative Crossed SLR (SP 90): R: Negative L: Negative  Facet Joint: Extension-Rotation (SN 100, -LR 0.0): R: Positive L: Not examined  Lumbar Foraminal Stenosis: Lumbar quadrant (SN 70): R: Positive L: Not examined  Hip: FABER (SN 81): R: Not examined L: Not examined FADIR (SN 94): R: Not examined L: Not examined Hip scour (SN 50): R: Not examined L: Not examined  TODAY'S TREATMENT: DATE: 03/25/24  Subjective: Patient reports 2-3/10 NPS in lower back. She reports that she has not done any weeding. No questions or concern.    Therapeutic Exercise (focused on strengthening core stability through abdominal/hip muscles):   Supine Glute Bridge with 3Kg Med Ball on Stomach 4 x 10 reps     Supine 90/90 isometric with Pallof Press for core stabilization   3 x 10 - Green TB   Supine 90/90 - Single LE extension for core stabilization  3 x 10 - Alternating LE   - Muscle fatigue in bilateral hip flexors   Standing Pallof Press for core stabilization   2 x 10 - 5#   Seated Forward Physioball Rollout for improved lumbar mobility  1 x 10 - Forward  1 x 10 -  R/L Ea directoin    Therapeutic Activity (improving multiple parameters/joints in order to optimize household ADLs):   NuStep L5-2 x 5 min x UE/LE (Seat 7) for LE  endurance and strength; PT manually adjusted resistance per patient tolerance   Lateral Stepping against resistance  4 x 12' - Green TB - around thighs   4 x 12' - Green TB - around ankles    PATIENT EDUCATION:  Education details: Exercise Technique, HEP Person educated: Patient Education method: Explanation, Demonstration, and Handouts Education comprehension: verbalized understanding and returned demonstration   HOME EXERCISE PROGRAM:  Access Code: [email or web address] URL: [email or web address] Date: 03/17/2024 Prepared by: Lonni Pall  Exercises - Supine Bilateral Hip Internal Rotation Stretch  - 1 x daily - 7 x weekly - 2-3 sets - 30s hold - Supine Lower Trunk Rotation  - 1 x daily - 7 x weekly - 2-3 sets - 10 reps - Supine Bridge  - 1 x daily - 3-4 x weekly - 2-3 sets - 10-12 reps - Supine Double Bent Leg Lift  - 1 x  daily - 3-4 x weekly - 2-3 sets - 10-12 reps - Supine 90/90 Overhead Dumbbell Raise  - 1 x daily - 3-4 x weekly - 2-3 sets - 10 reps - Supine 90/90 with Leg Extensions  - 1 x daily - 3-4 x weekly - 2-3 sets - 10 reps - Standing Posterior Pelvic Tilt  - 1 x daily - 3-4 x weekly - 2-3 sets - 10-12 reps  Access Code: [email or web address] URL: [email or web address] Date: 03/09/2024 Prepared by: Lonni Baker Kogler  Exercises - Supine Bilateral Hip Internal Rotation Stretch  - 1 x daily - 7 x weekly - 2-3 sets - 30s hold - Supine Lower Trunk Rotation  - 1 x daily - 7 x weekly - 2-3 sets - 10 reps - Supine Bridge  - 1 x daily - 3-4 x weekly - 2-3 sets - 10-12 reps - Supine Double Bent Leg Lift  - 1 x daily - 3-4 x weekly - 2-3 sets - 10-12 reps   ASSESSMENT:  CLINICAL IMPRESSION: Continued PT POC focused on core strengthen for improved lumbar stability. PT focused on increasing core stability with dynamic movements. PT regressed from squatting and bending tasks due to post session soreness following last PT session. She endorses minimal improvements in lower  back pain since start of POC.  PT continued to strongly encourage reduction in household work and activities in order to reduce further injury to recent Reverse TSR and lower back pain.  Based on today's performance, pt will continue to benefit from skilled PT in order to facilitate return to PLOF and improve QoL.   OBJECTIVE IMPAIRMENTS: decreased activity tolerance, decreased ROM, decreased strength, and pain.   ACTIVITY LIMITATIONS: lifting, bending, standing, squatting, transfers, and caring for others  PARTICIPATION LIMITATIONS: driving and yard work  PERSONAL FACTORS: Age, Behavior pattern, Past/current experiences, Time since onset of injury/illness/exacerbation, and 1-2 comorbidities: s/p L Reverse TSR, s/p bilateral TKR  are also affecting patient's functional outcome.   REHAB POTENTIAL: Fair Chronicity  CLINICAL DECISION MAKING: Evolving/moderate complexity  EVALUATION COMPLEXITY: Moderate   GOALS: Goals reviewed with patient? No  SHORT TERM GOALS: Target date: 04/06/2024   Pt will be independent with HEP in order to improve strength and decrease back pain to improve pain-free function at home and work. Baseline: 03/09/2024: initial HEP Goal status: INITIAL   LONG TERM GOALS: Target date: 05/04/2024   Pt will increase by at least 1.1 m/s in order to demonstrate clinically significant improvement in community ambulation. Baseline: 03/09/2024: .97 m/s Goal status: INITIAL  2.  Pt will decrease worst back pain by at least 2 points on the NPRS in order to demonstrate clinically significant reduction in back pain. Baseline: 03/09/2024: 10/10 NPS Goal status: INITIAL  3.  Pt will decrease mODI score by at least 13 points in order demonstrate clinically significant reduction in back pain/disability.       Baseline: 03/09/2024: 15/50 -30% Goal status: INITIAL  4.  Pt will increase R Hip IR to 35 in order to demonstrate significant improvements in hip mobility and limb  symmetry.  Baseline: 03/09/2024: R IR: 25 Goal status: INITIAL   PLAN:  PT FREQUENCY: 1-2x/week  PT DURATION: 8 weeks  PLANNED INTERVENTIONS: [id number]- PT Re-evaluation, 97110-Therapeutic exercises, 97530- Therapeutic activity, 97112- Neuromuscular re-education, 97535- Self Care, [id number]- Manual therapy, Joint mobilization, Cryotherapy, and Moist heat  PLAN FOR NEXT SESSION: Review HEP; initiate core stabilization, flexion-based exercises, supine core, hip strengthening   Lonni Pall PT, DPT Physical Therapist- [place]  03/25/2024, 3:22 PM

## 2024-03-31 ENCOUNTER — Ambulatory Visit

## 2024-03-31 DIAGNOSIS — M6281 Muscle weakness (generalized): Secondary | ICD-10-CM

## 2024-03-31 DIAGNOSIS — M5459 Other low back pain: Secondary | ICD-10-CM

## 2024-03-31 DIAGNOSIS — R262 Difficulty in walking, not elsewhere classified: Secondary | ICD-10-CM | POA: Diagnosis not present

## 2024-03-31 NOTE — Therapy (Signed)
 OUTPATIENT PHYSICAL THERAPY THORACOLUMBAR TREATMENT   Patient Name: Krystal [name] MRN: [id number] DOB:01-03-1951, 73 y.o., female Today's Date: 03/31/2024  END OF SESSION:  PT End of Session - 03/31/24 1518     Visit Number 7    Number of Visits 17    Date for PT Re-Evaluation 05/04/24    Authorization Type auth 17 visits    Authorization Time Period 8/18-10/14    Authorization - Visit Number 7    Authorization - Number of Visits 17    Progress Note Due on Visit 10    PT Start Time 1517    PT Stop Time 1600    PT Time Calculation (min) 43 min    Activity Tolerance Patient tolerated treatment well    Behavior During Therapy [facility] for tasks assessed/performed          Past Medical History:  Diagnosis Date   Anemia    Arthritis    osteo   Blindness of right eye at birth   Cancer [facility]) 2011   Left breast 2011 and cervical age 18   Carpal tunnel syndrome    Cataract    Depression    Edema 07/27/2011   Fibromyalgia    muscle weakness and pain   GERD (gastroesophageal reflux disease)    Hyperlipidemia    Hypertension    benign   Lymphedema    Osteoporosis    Plantar fasciitis    Pre-diabetes    Raynaud's disease    Restless leg syndrome    Rosacea    Rotator cuff rupture    Sleep apnea    Synovitis    Ulcer    Past Surgical History:  Procedure Laterality Date   ABDOMINAL HYSTERECTOMY  1986   For bleeding and pain   ATRIAL FIBRILLATION ABLATION N/A 05/14/2023   Procedure: ATRIAL FIBRILLATION ABLATION;  Surgeon: Cindie Ole DASEN, MD;  Location: [facility];  Service: Cardiovascular;  Laterality: N/A;   BLADDER SURGERY  2006   Bladder tack   BREAST RECONSTRUCTION Bilateral 09/17/2012   Procedure: BREAST RECONSTRUCTION;  Surgeon: Estefana Reichert, DO;  Location: [facility];  Service: Plastics;  Laterality: Bilateral;  BILATERAL BREAST RECONSTRUCTION WITH TISSUE EXPANDERS AND ALLOMED   BREAST RECONSTRUCTION Right 06/03/2013   Procedure: RIGHT  BREAST CAPSULE CONSTRACTURE;  Surgeon: Estefana Reichert, DO;  Location: [facility];  Service: Plastics;  Laterality: Right;   CAPSULECTOMY Right 11/18/2023   Procedure: EXCISION OF RIGHT BREAST CAPSULE CONTRACTURE AND BREAST TISSUE;  Surgeon: Lowery Estefana RAMAN, DO;  Location: [facility];  Service: Plastics;  Laterality: Right;   CARDIOVERSION N/A 08/06/2022   Procedure: CARDIOVERSION;  Surgeon: Darron Deatrice LABOR, MD;  Location: [facility];  Service: Cardiovascular;  Laterality: N/A;   CARDIOVERSION N/A 10/02/2022   Procedure: CARDIOVERSION;  Surgeon: Mady Bruckner, MD;  Location: [facility];  Service: Cardiovascular;  Laterality: N/A;   CARPAL TUNNEL RELEASE Bilateral 2008   EYE SURGERY Right    INJECTION KNEE Left 08/12/2018   Procedure: KNEE INJECTION-LEFT;  Surgeon: Edie Norleen PARAS, MD;  Location: [facility];  Service: Orthopedics;  Laterality: Left;   JOINT REPLACEMENT     LIPOSUCTION Bilateral 01/29/2013   Procedure: LIPOSUCTION;  Surgeon: Estefana Reichert, DO;  Location: [facility];  Service: Plastics;  Laterality: Bilateral;   LIPOSUCTION Right 06/03/2013   Procedure: LIPOSUCTION;  Surgeon: Estefana Reichert, DO;  Location: [facility];  Service: Plastics;  Laterality: Right;   LIPOSUCTION Right 11/18/2023  Procedure: LIPOSUCTION LATERAL RIGHT CHEST;  Surgeon: Lowery Estefana RAMAN, DO;  Location: [facility];  Service: Plastics;  Laterality: Right;   MASTECTOMY  2012   rt prophalactic mast-snbx   MASTECTOMY MODIFIED RADICAL  2012   left-axillary nodes   NEUROMA SURGERY Bilateral    NOSE SURGERY  2007   PORT-A-CATH REMOVAL     insertion and   REVERSE SHOULDER ARTHROPLASTY Left 01/16/2024   Procedure: ARTHROPLASTY, SHOULDER, TOTAL, REVERSE;  Surgeon: Melita Drivers, MD;  Location: [facility];  Service: Orthopedics;  Laterality: Left;   REVISION, RECONSTRUCTION, BREAST Right 11/18/2023   Procedure: REVISION RIGHT BREAST RECONSTRUCTION;   Surgeon: Lowery Estefana RAMAN, DO;  Location: [facility];  Service: Plastics;  Laterality: Right;  Excision of scar, capsule contracture right breast , possible liposuction   THROAT SURGERY     TONSILLECTOMY     TOTAL KNEE ARTHROPLASTY Right 08/12/2018   Procedure: TOTAL KNEE ARTHROPLASTY-RIGHT;  Surgeon: Edie Norleen PARAS, MD;  Location: [facility];  Service: Orthopedics;  Laterality: Right;   TOTAL KNEE ARTHROPLASTY Left 09/22/2019   Procedure: TOTAL KNEE ARTHROPLASTY;  Surgeon: Edie Norleen PARAS, MD;  Location: [facility];  Service: Orthopedics;  Laterality: Left;   UVULOPALATOPHARYNGOPLASTY     Patient Active Problem List   Diagnosis Date Noted   Raynaud's disease 11/05/2022   Osteoporosis, post-menopausal 11/05/2022   Atrial flutter (HCC) 10/02/2022   First degree AV block 09/11/2022   Arthritis 08/08/2022   Persistent atrial fibrillation (HCC) 08/06/2022   Depression 02/27/2022   Gastroesophageal reflux disease 02/27/2022   Hypertension 02/27/2022   Abnormal LFTs 02/27/2022   Alcohol  abuse 02/27/2022   Atrial fibrillation with RVR (HCC) 02/27/2022   Morbid obesity (HCC) 02/27/2022   Pes anserinus bursitis of left knee 02/08/2020   Status post total knee replacement using cement, left 09/22/2019   Status post total knee replacement using cement, right 08/12/2018   Rotator cuff rupture    Status post bilateral breast reconstruction 02/06/2013   Acquired absence of bilateral breasts and nipples 09/17/2012   Breast cancer of lower-outer quadrant of left female breast (HCC) 01/16/2011    PCP: Valora Agent, MD  REFERRING PROVIDER: Bonner Ade, MD  REFERRING DIAG:  M54.16 (ICD-10-CM) - Radiculopathy, lumbar region   RATIONALE FOR EVALUATION AND TREATMENT: Rehabilitation  THERAPY DIAG: Other low back pain  Muscle weakness (generalized)  Difficulty in walking, not elsewhere classified  ONSET DATE: Chronic  FOLLOW-UP APPT SCHEDULED WITH REFERRING PROVIDER: Yes     SUBJECTIVE:  SUBJECTIVE STATEMENT:    Patient reporting to OPPT with a chief concern of lower back pain.   PERTINENT HISTORY:   Stepahnie [name] is a 73 y.o. female presenting to OPPT with chronic lower back pain. She is familiar to this clinic and has recently d/c from PT for reverse total shoulder replacement (L).  Patient reports that 8-9 mos ago she thought sciatic pain however she realized the pain was originating from the back. Patient reports death in family (father) 1 yr ago; she reports that she was the primary caregiver for two years. She reports that her lower back pain radiates into both of her legs (R > L); pain stops at the knees.   Aggravating factors: Prolonged sitting in a straight chair; reaching for weeds;  Relieving factors: Leaning forward, bending forward; tylenol    She denies saddle paraesthesia, changes in bowel/bladder, acute trauma to lumbar spine.    Imaging: (per chart review 03/09/2024):  Two-view lumbar spine X-ray, PA and lateral, performed in the office today and personally reviewed. The patient does not have any significant scoliosis. Minimal arthritis is noted in the hip joints, with no arthritis in the sacroiliac joints. The patient has five non-rib-bearing lumbar vertebrae. Grade 1 spondylolisthesis is observed at L3-4 and L4-5. No compression fractures are noted.    PAIN:    Pain Intensity: Present: 6/10, Best: 5/10, Worst: 8/10 Pain location: R Lower back with intermittent L involvement  Pain Quality: constant, burning, and real hot  Radiating: Yes  Numbness/Tingling: No  PRECAUTIONS: None  WEIGHT BEARING RESTRICTIONS: No  FALLS: Has patient fallen in last 6 months? No  Living Environment Lives with: lives with their spouse Lives in: House/apartment Stairs:  Yes: Internal: 13 steps; on right going up Has following equipment at home: None  Prior level of function: Independent  Occupational demands: Retired  Hobbies: 35 yr old dog; Cleaning  Patient Goals: I would like to stand up straight without pain    OBJECTIVE:  Patient Surveys  mODI: 15/50 - 30%  Cognition WNL    Gross Musculoskeletal Assessment Tremor: None Bulk: Normal Tone: Normal No visible step-off along spinal column, no signs of scoliosis  GAIT: Distance walked: 25m  Assistive device utilized: None Level of assistance: Complete Independence Comments: Forward Trunk Lean  Posture: Lumbar lordosis: WNL Iliac crest height: Equal bilaterally Lumbar lateral shift: Negative  AROM AROM (Normal range in degrees) AROM   Lumbar   Flexion (65) 100%  Extension (30) 100%  Right lateral flexion (25) 100%   Left lateral flexion (25) 100%  Right rotation (30) 100%   Left rotation (30) 100%       Hip Right Left  Flexion (125) 110 110  Extension (15)    Abduction (40)    Adduction     Internal Rotation (45) 25 35  External Rotation (45) 30 30      Knee    Flexion (135) WNL WNL  Extension (0) WNL WNL      Ankle    Dorsiflexion (20) WNL WNL  Plantarflexion (50)    Inversion (35)    Eversion (15)    (* = pain; Blank rows = not tested)  LE MMT: MMT (out of 5) Right  Left   Hip flexion 4- 4-  Hip extension    Hip abduction 4- 4-  Hip adduction    Hip internal rotation 4+ 4+  Hip external rotation 4+ 4+  Knee flexion 5 5  Knee extension 5 5  Ankle dorsiflexion  5 5  Ankle plantarflexion 5 5  Ankle inversion    Ankle eversion    (* = pain; Blank rows = not tested)  Sensation Grossly intact to light touch throughout bilateral LEs as determined by testing dermatomes L2-S2. Proprioception, stereognosis, and hot/cold testing deferred on this date.  Reflexes Deferred   Muscle Length Hamstrings: R: Negative L: Negative Thomas (hip flexors): R:  Not examined L: Not examined  Palpation Location Right Left         Lumbar paraspinals 2 1  Quadratus Lumborum    Gluteus Maximus 2 1  Gluteus Medius    Deep hip external rotators 0 0  PSIS 0 0  Fortin's Area (SIJ)    Greater Trochanter 0 0  (Blank rows = not tested) Graded on 0-4 scale (0 = no pain, 1 = pain, 2 = pain with wincing/grimacing/flinching, 3 = pain with withdrawal, 4 = unwilling to allow palpation)  Passive Accessory  Motion Pt denies reproduction of back pain with CPA L1-L5 and UPA bilaterally L1-L5. Generally, hypomobile throughout  Special Tests Lumbar Radiculopathy and Discogenic: Centralization and Peripheralization (SN 92, -LR 0.12): Not examined Slump (SN 83, -LR 0.32): R: Not examined L: Not examined SLR (SN 92, -LR 0.29): R: Negative L:  Negative Crossed SLR (SP 90): R: Negative L: Negative  Facet Joint: Extension-Rotation (SN 100, -LR 0.0): R: Positive L: Not examined  Lumbar Foraminal Stenosis: Lumbar quadrant (SN 70): R: Positive L: Not examined  Hip: FABER (SN 81): R: Not examined L: Not examined FADIR (SN 94): R: Not examined L: Not examined Hip scour (SN 50): R: Not examined L: Not examined  TODAY'S TREATMENT: DATE: 03/31/24  Subjective: Patient reports 8/10 NPS in her lower back. She reports that she was performing a lot of physical activity in the back yard. She iced her back in order to reduce the pain. No question or concerns.     Therapeutic Exercise (focused on strengthening core stability through abdominal/hip muscles):   Supine Posterior Tilt with Double LE Lift (Knees bent)   2 x 10    Supine Glute Bridge    3 x 10 - VC for posterior tilt and glute activation  Supine 90/90 with OH reach for core stabilization    3 x 10 - 2 Kg MB  Supine 90/90 with LE extension    3 x 12 - VC for slow tempo and control posterior pelvic tilt   R Sidelying Clamshell   L: 2 x 10 - Blue TB   Seated Med Ball Torso Rotation   1 x 15 - 2 Kg  MB  1 x 20 - without LE support - 2 Kg MB   1 x 20 - without LE Support - 2 Kg MB    Therapeutic Activity (improving multiple parameters/joints in order to optimize household ADLs and yard work):   NuStep L5-2 x 6 min x UE/LE (Seat 7) for LE endurance and strength for walking capacity; PT manually adjusted resistance per patient tolerance   Standing Pallof Press on balance pad for core stabilization in the garden   Resistance from R/L: 3 x 10 - Blue TB    PATIENT EDUCATION:  Education details: Exercise Technique, HEP Person educated: Patient Education method: Explanation, Demonstration, and Handouts Education comprehension: verbalized understanding and returned demonstration   HOME EXERCISE PROGRAM:  Access Code: [email or web address] URL: [email or web address] Date: 03/17/2024 Prepared by: Lonni Pall  Exercises - Supine Bilateral Hip Internal Rotation Stretch  - 1 x daily -  7 x weekly - 2-3 sets - 30s hold - Supine Lower Trunk Rotation  - 1 x daily - 7 x weekly - 2-3 sets - 10 reps - Supine Bridge  - 1 x daily - 3-4 x weekly - 2-3 sets - 10-12 reps - Supine Double Bent Leg Lift  - 1 x daily - 3-4 x weekly - 2-3 sets - 10-12 reps - Supine 90/90 Overhead Dumbbell Raise  - 1 x daily - 3-4 x weekly - 2-3 sets - 10 reps - Supine 90/90 with Leg Extensions  - 1 x daily - 3-4 x weekly - 2-3 sets - 10 reps - Standing Posterior Pelvic Tilt  - 1 x daily - 3-4 x weekly - 2-3 sets - 10-12 reps  Access Code: [email or web address] URL: [email or web address] Date: 03/09/2024 Prepared by: Lonni Jakala Herford  Exercises - Supine Bilateral Hip Internal Rotation Stretch  - 1 x daily - 7 x weekly - 2-3 sets - 30s hold - Supine Lower Trunk Rotation  - 1 x daily - 7 x weekly - 2-3 sets - 10 reps - Supine Bridge  - 1 x daily - 3-4 x weekly - 2-3 sets - 10-12 reps - Supine Double Bent Leg Lift  - 1 x daily - 3-4 x weekly - 2-3 sets - 10-12 reps   ASSESSMENT:  CLINICAL IMPRESSION: Continued  PT POC focused on core strengthen for improved lumbar stability. PT focused on core stability with posterior pelvic tilt. Good demonstration of maintaining neutral pelvic tilt while performing supine 90/90 exercises. Pt endorsed improvements in lower back pain at end of session following PT interventions. PT continued to strongly encourage reduction in household work and activities in order to reduce further injury to recent Reverse TSR and lower back pain.  Based on today's performance, pt will continue to benefit from skilled PT in order to facilitate return to PLOF and improve QoL.   OBJECTIVE IMPAIRMENTS: decreased activity tolerance, decreased ROM, decreased strength, and pain.   ACTIVITY LIMITATIONS: lifting, bending, standing, squatting, transfers, and caring for others  PARTICIPATION LIMITATIONS: driving and yard work  PERSONAL FACTORS: Age, Behavior pattern, Past/current experiences, Time since onset of injury/illness/exacerbation, and 1-2 comorbidities: s/p L Reverse TSR, s/p bilateral TKR  are also affecting patient's functional outcome.   REHAB POTENTIAL: Fair Chronicity  CLINICAL DECISION MAKING: Evolving/moderate complexity  EVALUATION COMPLEXITY: Moderate   GOALS: Goals reviewed with patient? No  SHORT TERM GOALS: Target date: 04/06/2024   Pt will be independent with HEP in order to improve strength and decrease back pain to improve pain-free function at home and work. Baseline: 03/09/2024: initial HEP Goal status: INITIAL   LONG TERM GOALS: Target date: 05/04/2024   Pt will increase by at least 1.1 m/s in order to demonstrate clinically significant improvement in community ambulation. Baseline: 03/09/2024: .97 m/s Goal status: INITIAL  2.  Pt will decrease worst back pain by at least 2 points on the NPRS in order to demonstrate clinically significant reduction in back pain. Baseline: 03/09/2024: 10/10 NPS Goal status: INITIAL  3.  Pt will decrease mODI score  by at least 13 points in order demonstrate clinically significant reduction in back pain/disability.       Baseline: 03/09/2024: 15/50 -30% Goal status: INITIAL  4.  Pt will increase R Hip IR to 35 in order to demonstrate significant improvements in hip mobility and limb symmetry.  Baseline: 03/09/2024: R IR: 25 Goal status: INITIAL   PLAN:  PT FREQUENCY: 1-2x/week  PT DURATION:  8 weeks  PLANNED INTERVENTIONS: [id number]- PT Re-evaluation, 97110-Therapeutic exercises, 97530- Therapeutic activity, 97112- Neuromuscular re-education, 97535- Self Care, [id number]- Manual therapy, Joint mobilization, Cryotherapy, and Moist heat  PLAN FOR NEXT SESSION: Review HEP; progress core stabilization, flexion-based exercises, hip strengthening, posterior pelvic tilting  Lonni Pall PT, DPT Physical Therapist- [place]  03/31/2024, 3:21 PM

## 2024-04-01 NOTE — Therapy (Signed)
 OUTPATIENT PHYSICAL THERAPY THORACOLUMBAR TREATMENT   Patient Name: Krystal [name] MRN: [id number] DOB:22-Dec-1950, 73 y.o., female Today's Date: 04/02/2024  END OF SESSION:  PT End of Session - 04/02/24 1119     Visit Number 8    Number of Visits 17    Date for PT Re-Evaluation 05/04/24    Authorization Type auth 17 visits    Authorization Time Period 8/18-10/14    Authorization - Number of Visits 17    Progress Note Due on Visit 10    PT Start Time 1116    PT Stop Time 1157    PT Time Calculation (min) 41 min    Activity Tolerance Patient tolerated treatment well    Behavior During Therapy [facility] for tasks assessed/performed           Past Medical History:  Diagnosis Date   Anemia    Arthritis    osteo   Blindness of right eye at birth   Cancer [facility]) 2011   Left breast 2011 and cervical age 19   Carpal tunnel syndrome    Cataract    Depression    Edema 07/27/2011   Fibromyalgia    muscle weakness and pain   GERD (gastroesophageal reflux disease)    Hyperlipidemia    Hypertension    benign   Lymphedema    Osteoporosis    Plantar fasciitis    Pre-diabetes    Raynaud's disease    Restless leg syndrome    Rosacea    Rotator cuff rupture    Sleep apnea    Synovitis    Ulcer    Past Surgical History:  Procedure Laterality Date   ABDOMINAL HYSTERECTOMY  1986   For bleeding and pain   ATRIAL FIBRILLATION ABLATION N/A 05/14/2023   Procedure: ATRIAL FIBRILLATION ABLATION;  Surgeon: Cindie Ole DASEN, MD;  Location: [facility];  Service: Cardiovascular;  Laterality: N/A;   BLADDER SURGERY  2006   Bladder tack   BREAST RECONSTRUCTION Bilateral 09/17/2012   Procedure: BREAST RECONSTRUCTION;  Surgeon: Estefana Reichert, DO;  Location: [facility];  Service: Plastics;  Laterality: Bilateral;  BILATERAL BREAST RECONSTRUCTION WITH TISSUE EXPANDERS AND ALLOMED   BREAST RECONSTRUCTION Right 06/03/2013   Procedure: RIGHT BREAST CAPSULE CONSTRACTURE;   Surgeon: Estefana Reichert, DO;  Location: [facility];  Service: Plastics;  Laterality: Right;   CAPSULECTOMY Right 11/18/2023   Procedure: EXCISION OF RIGHT BREAST CAPSULE CONTRACTURE AND BREAST TISSUE;  Surgeon: Lowery Estefana RAMAN, DO;  Location: [facility];  Service: Plastics;  Laterality: Right;   CARDIOVERSION N/A 08/06/2022   Procedure: CARDIOVERSION;  Surgeon: Darron Deatrice LABOR, MD;  Location: [facility];  Service: Cardiovascular;  Laterality: N/A;   CARDIOVERSION N/A 10/02/2022   Procedure: CARDIOVERSION;  Surgeon: Mady Bruckner, MD;  Location: [facility];  Service: Cardiovascular;  Laterality: N/A;   CARPAL TUNNEL RELEASE Bilateral 2008   EYE SURGERY Right    INJECTION KNEE Left 08/12/2018   Procedure: KNEE INJECTION-LEFT;  Surgeon: Edie Norleen PARAS, MD;  Location: [facility];  Service: Orthopedics;  Laterality: Left;   JOINT REPLACEMENT     LIPOSUCTION Bilateral 01/29/2013   Procedure: LIPOSUCTION;  Surgeon: Estefana Reichert, DO;  Location: [facility];  Service: Plastics;  Laterality: Bilateral;   LIPOSUCTION Right 06/03/2013   Procedure: LIPOSUCTION;  Surgeon: Estefana Reichert, DO;  Location: [facility];  Service: Plastics;  Laterality: Right;   LIPOSUCTION Right 11/18/2023   Procedure: LIPOSUCTION LATERAL RIGHT CHEST;  Surgeon: Lowery Estefana RAMAN, DO;  Location: [facility];  Service: Plastics;  Laterality: Right;   MASTECTOMY  2012   rt prophalactic mast-snbx   MASTECTOMY MODIFIED RADICAL  2012   left-axillary nodes   NEUROMA SURGERY Bilateral    NOSE SURGERY  2007   PORT-A-CATH REMOVAL     insertion and   REVERSE SHOULDER ARTHROPLASTY Left 01/16/2024   Procedure: ARTHROPLASTY, SHOULDER, TOTAL, REVERSE;  Surgeon: Melita Drivers, MD;  Location: [facility];  Service: Orthopedics;  Laterality: Left;   REVISION, RECONSTRUCTION, BREAST Right 11/18/2023   Procedure: REVISION RIGHT BREAST RECONSTRUCTION;  Surgeon: Lowery Estefana RAMAN, DO;  Location: [facility];  Service: Plastics;  Laterality: Right;  Excision of scar, capsule contracture right breast , possible liposuction   THROAT SURGERY     TONSILLECTOMY     TOTAL KNEE ARTHROPLASTY Right 08/12/2018   Procedure: TOTAL KNEE ARTHROPLASTY-RIGHT;  Surgeon: Edie Norleen PARAS, MD;  Location: [facility];  Service: Orthopedics;  Laterality: Right;   TOTAL KNEE ARTHROPLASTY Left 09/22/2019   Procedure: TOTAL KNEE ARTHROPLASTY;  Surgeon: Edie Norleen PARAS, MD;  Location: [facility];  Service: Orthopedics;  Laterality: Left;   UVULOPALATOPHARYNGOPLASTY     Patient Active Problem List   Diagnosis Date Noted   Raynaud's disease 11/05/2022   Osteoporosis, post-menopausal 11/05/2022   Atrial flutter (HCC) 10/02/2022   First degree AV block 09/11/2022   Arthritis 08/08/2022   Persistent atrial fibrillation (HCC) 08/06/2022   Depression 02/27/2022   Gastroesophageal reflux disease 02/27/2022   Hypertension 02/27/2022   Abnormal LFTs 02/27/2022   Alcohol  abuse 02/27/2022   Atrial fibrillation with RVR (HCC) 02/27/2022   Morbid obesity (HCC) 02/27/2022   Pes anserinus bursitis of left knee 02/08/2020   Status post total knee replacement using cement, left 09/22/2019   Status post total knee replacement using cement, right 08/12/2018   Rotator cuff rupture    Status post bilateral breast reconstruction 02/06/2013   Acquired absence of bilateral breasts and nipples 09/17/2012   Breast cancer of lower-outer quadrant of left female breast (HCC) 01/16/2011    PCP: Valora Agent, MD  REFERRING PROVIDER: Bonner Ade, MD  REFERRING DIAG:  M54.16 (ICD-10-CM) - Radiculopathy, lumbar region   RATIONALE FOR EVALUATION AND TREATMENT: Rehabilitation  THERAPY DIAG: Other low back pain  Muscle weakness (generalized)  Difficulty in walking, not elsewhere classified  ONSET DATE: Chronic  FOLLOW-UP APPT SCHEDULED WITH REFERRING PROVIDER: Yes    SUBJECTIVE:  SUBJECTIVE STATEMENT:    Patient reporting to OPPT with a chief concern of lower back pain.   PERTINENT HISTORY:   Krystal [name] is a 73 y.o. female presenting to OPPT with chronic lower back pain. She is familiar to this clinic and has recently d/c from PT for reverse total shoulder replacement (L).  Patient reports that 8-9 mos ago she thought sciatic pain however she realized the pain was originating from the back. Patient reports death in family (father) 1 yr ago; she reports that she was the primary caregiver for two years. She reports that her lower back pain radiates into both of her legs (R > L); pain stops at the knees.   Aggravating factors: Prolonged sitting in a straight chair; reaching for weeds;  Relieving factors: Leaning forward, bending forward; tylenol    She denies saddle paraesthesia, changes in bowel/bladder, acute trauma to lumbar spine.    Imaging: (per chart review 03/09/2024):  Two-view lumbar spine X-ray, PA and lateral, performed in the office today and personally reviewed. The patient does not have any significant scoliosis. Minimal arthritis is noted in the hip joints, with no arthritis in the sacroiliac joints. The patient has five non-rib-bearing lumbar vertebrae. Grade 1 spondylolisthesis is observed at L3-4 and L4-5. No compression fractures are noted.    PAIN:    Pain Intensity: Present: 6/10, Best: 5/10, Worst: 8/10 Pain location: R Lower back with intermittent L involvement  Pain Quality: constant, burning, and real hot  Radiating: Yes  Numbness/Tingling: No  PRECAUTIONS: None  WEIGHT BEARING RESTRICTIONS: No  FALLS: Has patient fallen in last 6 months? No  Living Environment Lives with: lives with their spouse Lives in: House/apartment Stairs: Yes: Internal: 13 steps; on right  going up Has following equipment at home: None  Prior level of function: Independent  Occupational demands: Retired  Hobbies: 37 yr old dog; Cleaning  Patient Goals: I would like to stand up straight without pain    OBJECTIVE:  Patient Surveys  mODI: 15/50 - 30%  Cognition WNL    Gross Musculoskeletal Assessment Tremor: None Bulk: Normal Tone: Normal No visible step-off along spinal column, no signs of scoliosis  GAIT: Distance walked: 33m  Assistive device utilized: None Level of assistance: Complete Independence Comments: Forward Trunk Lean  Posture: Lumbar lordosis: WNL Iliac crest height: Equal bilaterally Lumbar lateral shift: Negative  AROM AROM (Normal range in degrees) AROM   Lumbar   Flexion (65) 100%  Extension (30) 100%  Right lateral flexion (25) 100%   Left lateral flexion (25) 100%  Right rotation (30) 100%   Left rotation (30) 100%       Hip Right Left  Flexion (125) 110 110  Extension (15)    Abduction (40)    Adduction     Internal Rotation (45) 25 35  External Rotation (45) 30 30      Knee    Flexion (135) WNL WNL  Extension (0) WNL WNL      Ankle    Dorsiflexion (20) WNL WNL  Plantarflexion (50)    Inversion (35)    Eversion (15)    (* = pain; Blank rows = not tested)  LE MMT: MMT (out of 5) Right  Left   Hip flexion 4- 4-  Hip extension    Hip abduction 4- 4-  Hip adduction    Hip internal rotation 4+ 4+  Hip external rotation 4+ 4+  Knee flexion 5 5  Knee extension 5 5  Ankle dorsiflexion  5 5  Ankle plantarflexion 5 5  Ankle inversion    Ankle eversion    (* = pain; Blank rows = not tested)  Sensation Grossly intact to light touch throughout bilateral LEs as determined by testing dermatomes L2-S2. Proprioception, stereognosis, and hot/cold testing deferred on this date.  Reflexes Deferred   Muscle Length Hamstrings: R: Negative L: Negative Thomas (hip flexors): R: Not examined L: Not  examined  Palpation Location Right Left         Lumbar paraspinals 2 1  Quadratus Lumborum    Gluteus Maximus 2 1  Gluteus Medius    Deep hip external rotators 0 0  PSIS 0 0  Fortin's Area (SIJ)    Greater Trochanter 0 0  (Blank rows = not tested) Graded on 0-4 scale (0 = no pain, 1 = pain, 2 = pain with wincing/grimacing/flinching, 3 = pain with withdrawal, 4 = unwilling to allow palpation)  Passive Accessory  Motion Pt denies reproduction of back pain with CPA L1-L5 and UPA bilaterally L1-L5. Generally, hypomobile throughout  Special Tests Lumbar Radiculopathy and Discogenic: Centralization and Peripheralization (SN 92, -LR 0.12): Not examined Slump (SN 83, -LR 0.32): R: Not examined L: Not examined SLR (SN 92, -LR 0.29): R: Negative L:  Negative Crossed SLR (SP 90): R: Negative L: Negative  Facet Joint: Extension-Rotation (SN 100, -LR 0.0): R: Positive L: Not examined  Lumbar Foraminal Stenosis: Lumbar quadrant (SN 70): R: Positive L: Not examined  Hip: FABER (SN 81): R: Not examined L: Not examined FADIR (SN 94): R: Not examined L: Not examined Hip scour (SN 50): R: Not examined L: Not examined  TODAY'S TREATMENT: DATE: 04/02/24   Subjective: Patient reports 7-8/10 NPS in her lower back.     Therapeutic Exercise (focused on strengthening core stability through abdominal/hip muscles):   Supine Posterior Tilt with Double LE Lift (Knees bent)   2 x 10    Supine Glute Bridge    3 x 10 - VC for posterior tilt and glute activation   Supine lower trunk rotation x 15 each direction   Supine 90/90 with OH reach for core stabilization    3 x 10 - 2 Kg MB  Supine 90/90 with LE extension    2 x 12 - VC for slow tempo and control posterior pelvic tilt    Seated Med Ball Torso Rotation   2 x 20 - without LE Support - 2 Kg MB    Therapeutic Activity (improving multiple parameters/joints in order to optimize household ADLs and yard work):   NuStep L5-2 x 6 min x  UE/LE (Seat 7) for LE endurance and strength for walking capacity; PT manually adjusted resistance per patient tolerance   Standing Pallof Press on balance pad for core stabilization in the garden   Resistance from R/L: 3 x 10 - Blue TB    PATIENT EDUCATION:  Education details: Exercise Technique, HEP Person educated: Patient Education method: Explanation, Demonstration, and Handouts Education comprehension: verbalized understanding and returned demonstration   HOME EXERCISE PROGRAM:  Access Code: [email or web address] URL: [email or web address] Date: 03/17/2024 Prepared by: Lonni Pall  Exercises - Supine Bilateral Hip Internal Rotation Stretch  - 1 x daily - 7 x weekly - 2-3 sets - 30s hold - Supine Lower Trunk Rotation  - 1 x daily - 7 x weekly - 2-3 sets - 10 reps - Supine Bridge  - 1 x daily - 3-4 x weekly - 2-3 sets - 10-12 reps - Supine Double Bent  Leg Lift  - 1 x daily - 3-4 x weekly - 2-3 sets - 10-12 reps - Supine 90/90 Overhead Dumbbell Raise  - 1 x daily - 3-4 x weekly - 2-3 sets - 10 reps - Supine 90/90 with Leg Extensions  - 1 x daily - 3-4 x weekly - 2-3 sets - 10 reps - Standing Posterior Pelvic Tilt  - 1 x daily - 3-4 x weekly - 2-3 sets - 10-12 reps  Access Code: [email or web address] URL: [email or web address] Date: 03/09/2024 Prepared by: Lonni Go  Exercises - Supine Bilateral Hip Internal Rotation Stretch  - 1 x daily - 7 x weekly - 2-3 sets - 30s hold - Supine Lower Trunk Rotation  - 1 x daily - 7 x weekly - 2-3 sets - 10 reps - Supine Bridge  - 1 x daily - 3-4 x weekly - 2-3 sets - 10-12 reps - Supine Double Bent Leg Lift  - 1 x daily - 3-4 x weekly - 2-3 sets - 10-12 reps   ASSESSMENT:  CLINICAL IMPRESSION:   Continued PT POC focused on core strengthen for improved lumbar stability. PT focused on core stability with posterior pelvic tilt. Good demonstration of maintaining neutral pelvic tilt while performing supine 90/90 exercises.  Complained of 8/10 pain in low back at end of session. PT continued to strongly encourage reduction in household work and activities in order to reduce further injury to recent Reverse TSR and lower back pain.  Based on today's performance, pt will continue to benefit from skilled PT in order to facilitate return to PLOF and improve QoL.   OBJECTIVE IMPAIRMENTS: decreased activity tolerance, decreased ROM, decreased strength, and pain.   ACTIVITY LIMITATIONS: lifting, bending, standing, squatting, transfers, and caring for others  PARTICIPATION LIMITATIONS: driving and yard work  PERSONAL FACTORS: Age, Behavior pattern, Past/current experiences, Time since onset of injury/illness/exacerbation, and 1-2 comorbidities: s/p L Reverse TSR, s/p bilateral TKR  are also affecting patient's functional outcome.   REHAB POTENTIAL: Fair Chronicity  CLINICAL DECISION MAKING: Evolving/moderate complexity  EVALUATION COMPLEXITY: Moderate   GOALS: Goals reviewed with patient? No  SHORT TERM GOALS: Target date: 04/06/2024   Pt will be independent with HEP in order to improve strength and decrease back pain to improve pain-free function at home and work. Baseline: 03/09/2024: initial HEP Goal status: INITIAL   LONG TERM GOALS: Target date: 05/04/2024   Pt will increase by at least 1.1 m/s in order to demonstrate clinically significant improvement in community ambulation. Baseline: 03/09/2024: .97 m/s Goal status: INITIAL  2.  Pt will decrease worst back pain by at least 2 points on the NPRS in order to demonstrate clinically significant reduction in back pain. Baseline: 03/09/2024: 10/10 NPS Goal status: INITIAL  3.  Pt will decrease mODI score by at least 13 points in order demonstrate clinically significant reduction in back pain/disability.       Baseline: 03/09/2024: 15/50 -30% Goal status: INITIAL  4.  Pt will increase R Hip IR to 35 in order to demonstrate significant improvements  in hip mobility and limb symmetry.  Baseline: 03/09/2024: R IR: 25 Goal status: INITIAL   PLAN:  PT FREQUENCY: 1-2x/week  PT DURATION: 8 weeks  PLANNED INTERVENTIONS: [id number]- PT Re-evaluation, 97110-Therapeutic exercises, 97530- Therapeutic activity, 97112- Neuromuscular re-education, 97535- Self Care, [id number]- Manual therapy, Joint mobilization, Cryotherapy, and Moist heat  PLAN FOR NEXT SESSION: Review HEP; progress core stabilization, flexion-based exercises, hip strengthening, posterior pelvic tilting  Maryanne Finder, PT, DPT Physical Therapist -  [facility]  [facility]  04/02/2024, 11:19 AM

## 2024-04-02 ENCOUNTER — Ambulatory Visit

## 2024-04-02 DIAGNOSIS — M5459 Other low back pain: Secondary | ICD-10-CM | POA: Diagnosis not present

## 2024-04-02 DIAGNOSIS — R262 Difficulty in walking, not elsewhere classified: Secondary | ICD-10-CM

## 2024-04-02 DIAGNOSIS — M6281 Muscle weakness (generalized): Secondary | ICD-10-CM | POA: Diagnosis not present

## 2024-04-06 NOTE — Therapy (Signed)
 OUTPATIENT PHYSICAL THERAPY THORACOLUMBAR TREATMENT   Patient Name: Krystal [name] MRN: [id number] DOB:1951-05-12, 73 y.o., female Today's Date: 04/07/2024  END OF SESSION:  PT End of Session - 04/07/24 1519     Visit Number 9    Number of Visits 17    Date for PT Re-Evaluation 05/04/24    Authorization Type auth 17 visits    Authorization Time Period 8/18-10/14    Authorization - Number of Visits 17    Progress Note Due on Visit 10    PT Start Time 1518    PT Stop Time 1558    PT Time Calculation (min) 40 min    Activity Tolerance Patient tolerated treatment well    Behavior During Therapy [facility] for tasks assessed/performed            Past Medical History:  Diagnosis Date   Anemia    Arthritis    osteo   Blindness of right eye at birth   Cancer [facility]) 2011   Left breast 2011 and cervical age 89   Carpal tunnel syndrome    Cataract    Depression    Edema 07/27/2011   Fibromyalgia    muscle weakness and pain   GERD (gastroesophageal reflux disease)    Hyperlipidemia    Hypertension    benign   Lymphedema    Osteoporosis    Plantar fasciitis    Pre-diabetes    Raynaud's disease    Restless leg syndrome    Rosacea    Rotator cuff rupture    Sleep apnea    Synovitis    Ulcer    Past Surgical History:  Procedure Laterality Date   ABDOMINAL HYSTERECTOMY  1986   For bleeding and pain   ATRIAL FIBRILLATION ABLATION N/A 05/14/2023   Procedure: ATRIAL FIBRILLATION ABLATION;  Surgeon: Cindie Ole DASEN, MD;  Location: [facility];  Service: Cardiovascular;  Laterality: N/A;   BLADDER SURGERY  2006   Bladder tack   BREAST RECONSTRUCTION Bilateral 09/17/2012   Procedure: BREAST RECONSTRUCTION;  Surgeon: Estefana Reichert, DO;  Location: [facility];  Service: Plastics;  Laterality: Bilateral;  BILATERAL BREAST RECONSTRUCTION WITH TISSUE EXPANDERS AND ALLOMED   BREAST RECONSTRUCTION Right 06/03/2013   Procedure: RIGHT BREAST CAPSULE CONSTRACTURE;   Surgeon: Estefana Reichert, DO;  Location: [facility];  Service: Plastics;  Laterality: Right;   CAPSULECTOMY Right 11/18/2023   Procedure: EXCISION OF RIGHT BREAST CAPSULE CONTRACTURE AND BREAST TISSUE;  Surgeon: Lowery Estefana RAMAN, DO;  Location: [facility];  Service: Plastics;  Laterality: Right;   CARDIOVERSION N/A 08/06/2022   Procedure: CARDIOVERSION;  Surgeon: Darron Deatrice LABOR, MD;  Location: [facility];  Service: Cardiovascular;  Laterality: N/A;   CARDIOVERSION N/A 10/02/2022   Procedure: CARDIOVERSION;  Surgeon: Mady Bruckner, MD;  Location: [facility];  Service: Cardiovascular;  Laterality: N/A;   CARPAL TUNNEL RELEASE Bilateral 2008   EYE SURGERY Right    INJECTION KNEE Left 08/12/2018   Procedure: KNEE INJECTION-LEFT;  Surgeon: Edie Norleen PARAS, MD;  Location: [facility];  Service: Orthopedics;  Laterality: Left;   JOINT REPLACEMENT     LIPOSUCTION Bilateral 01/29/2013   Procedure: LIPOSUCTION;  Surgeon: Estefana Reichert, DO;  Location: [facility];  Service: Plastics;  Laterality: Bilateral;   LIPOSUCTION Right 06/03/2013   Procedure: LIPOSUCTION;  Surgeon: Estefana Reichert, DO;  Location: [facility];  Service: Plastics;  Laterality: Right;   LIPOSUCTION Right 11/18/2023   Procedure: LIPOSUCTION LATERAL RIGHT  CHEST;  Surgeon: Lowery Estefana RAMAN, DO;  Location: [facility];  Service: Plastics;  Laterality: Right;   MASTECTOMY  2012   rt prophalactic mast-snbx   MASTECTOMY MODIFIED RADICAL  2012   left-axillary nodes   NEUROMA SURGERY Bilateral    NOSE SURGERY  2007   PORT-A-CATH REMOVAL     insertion and   REVERSE SHOULDER ARTHROPLASTY Left 01/16/2024   Procedure: ARTHROPLASTY, SHOULDER, TOTAL, REVERSE;  Surgeon: Melita Drivers, MD;  Location: [facility];  Service: Orthopedics;  Laterality: Left;   REVISION, RECONSTRUCTION, BREAST Right 11/18/2023   Procedure: REVISION RIGHT BREAST RECONSTRUCTION;  Surgeon: Lowery Estefana RAMAN, DO;  Location: [facility];  Service: Plastics;  Laterality: Right;  Excision of scar, capsule contracture right breast , possible liposuction   THROAT SURGERY     TONSILLECTOMY     TOTAL KNEE ARTHROPLASTY Right 08/12/2018   Procedure: TOTAL KNEE ARTHROPLASTY-RIGHT;  Surgeon: Edie Norleen PARAS, MD;  Location: [facility];  Service: Orthopedics;  Laterality: Right;   TOTAL KNEE ARTHROPLASTY Left 09/22/2019   Procedure: TOTAL KNEE ARTHROPLASTY;  Surgeon: Edie Norleen PARAS, MD;  Location: [facility];  Service: Orthopedics;  Laterality: Left;   UVULOPALATOPHARYNGOPLASTY     Patient Active Problem List   Diagnosis Date Noted   Raynaud's disease 11/05/2022   Osteoporosis, post-menopausal 11/05/2022   Atrial flutter (HCC) 10/02/2022   First degree AV block 09/11/2022   Arthritis 08/08/2022   Persistent atrial fibrillation (HCC) 08/06/2022   Depression 02/27/2022   Gastroesophageal reflux disease 02/27/2022   Hypertension 02/27/2022   Abnormal LFTs 02/27/2022   Alcohol  abuse 02/27/2022   Atrial fibrillation with RVR (HCC) 02/27/2022   Morbid obesity (HCC) 02/27/2022   Pes anserinus bursitis of left knee 02/08/2020   Status post total knee replacement using cement, left 09/22/2019   Status post total knee replacement using cement, right 08/12/2018   Rotator cuff rupture    Status post bilateral breast reconstruction 02/06/2013   Acquired absence of bilateral breasts and nipples 09/17/2012   Breast cancer of lower-outer quadrant of left female breast (HCC) 01/16/2011    PCP: Valora Agent, MD  REFERRING PROVIDER: Bonner Ade, MD  REFERRING DIAG:  M54.16 (ICD-10-CM) - Radiculopathy, lumbar region   RATIONALE FOR EVALUATION AND TREATMENT: Rehabilitation  THERAPY DIAG: Other low back pain  Muscle weakness (generalized)  Difficulty in walking, not elsewhere classified  ONSET DATE: Chronic  FOLLOW-UP APPT SCHEDULED WITH REFERRING PROVIDER: Yes    SUBJECTIVE:  SUBJECTIVE STATEMENT:    Patient reporting to OPPT with a chief concern of lower back pain.   PERTINENT HISTORY:   Krystal [name] is a 73 y.o. female presenting to OPPT with chronic lower back pain. She is familiar to this clinic and has recently d/c from PT for reverse total shoulder replacement (L).  Patient reports that 8-9 mos ago she thought sciatic pain however she realized the pain was originating from the back. Patient reports death in family (father) 1 yr ago; she reports that she was the primary caregiver for two years. She reports that her lower back pain radiates into both of her legs (R > L); pain stops at the knees.   Aggravating factors: Prolonged sitting in a straight chair; reaching for weeds;  Relieving factors: Leaning forward, bending forward; tylenol    She denies saddle paraesthesia, changes in bowel/bladder, acute trauma to lumbar spine.    Imaging: (per chart review 03/09/2024):  Two-view lumbar spine X-ray, PA and lateral, performed in the office today and personally reviewed. The patient does not have any significant scoliosis. Minimal arthritis is noted in the hip joints, with no arthritis in the sacroiliac joints. The patient has five non-rib-bearing lumbar vertebrae. Grade 1 spondylolisthesis is observed at L3-4 and L4-5. No compression fractures are noted.    PAIN:    Pain Intensity: Present: 6/10, Best: 5/10, Worst: 8/10 Pain location: R Lower back with intermittent L involvement  Pain Quality: constant, burning, and real hot  Radiating: Yes  Numbness/Tingling: No  PRECAUTIONS: None  WEIGHT BEARING RESTRICTIONS: No  FALLS: Has patient fallen in last 6 months? No  Living Environment Lives with: lives with their spouse Lives in: House/apartment Stairs: Yes: Internal: 13 steps; on right  going up Has following equipment at home: None  Prior level of function: Independent  Occupational demands: Retired  Hobbies: 15 yr old dog; Cleaning  Patient Goals: I would like to stand up straight without pain    OBJECTIVE:  Patient Surveys  mODI: 15/50 - 30%  Cognition WNL    Gross Musculoskeletal Assessment Tremor: None Bulk: Normal Tone: Normal No visible step-off along spinal column, no signs of scoliosis  GAIT: Distance walked: 84m  Assistive device utilized: None Level of assistance: Complete Independence Comments: Forward Trunk Lean  Posture: Lumbar lordosis: WNL Iliac crest height: Equal bilaterally Lumbar lateral shift: Negative  AROM AROM (Normal range in degrees) AROM   Lumbar   Flexion (65) 100%  Extension (30) 100%  Right lateral flexion (25) 100%   Left lateral flexion (25) 100%  Right rotation (30) 100%   Left rotation (30) 100%       Hip Right Left  Flexion (125) 110 110  Extension (15)    Abduction (40)    Adduction     Internal Rotation (45) 25 35  External Rotation (45) 30 30      Knee    Flexion (135) WNL WNL  Extension (0) WNL WNL      Ankle    Dorsiflexion (20) WNL WNL  Plantarflexion (50)    Inversion (35)    Eversion (15)    (* = pain; Blank rows = not tested)  LE MMT: MMT (out of 5) Right  Left   Hip flexion 4- 4-  Hip extension    Hip abduction 4- 4-  Hip adduction    Hip internal rotation 4+ 4+  Hip external rotation 4+ 4+  Knee flexion 5 5  Knee extension 5 5  Ankle dorsiflexion  5 5  Ankle plantarflexion 5 5  Ankle inversion    Ankle eversion    (* = pain; Blank rows = not tested)  Sensation Grossly intact to light touch throughout bilateral LEs as determined by testing dermatomes L2-S2. Proprioception, stereognosis, and hot/cold testing deferred on this date.  Reflexes Deferred   Muscle Length Hamstrings: R: Negative L: Negative Thomas (hip flexors): R: Not examined L: Not  examined  Palpation Location Right Left         Lumbar paraspinals 2 1  Quadratus Lumborum    Gluteus Maximus 2 1  Gluteus Medius    Deep hip external rotators 0 0  PSIS 0 0  Fortin's Area (SIJ)    Greater Trochanter 0 0  (Blank rows = not tested) Graded on 0-4 scale (0 = no pain, 1 = pain, 2 = pain with wincing/grimacing/flinching, 3 = pain with withdrawal, 4 = unwilling to allow palpation)  Passive Accessory  Motion Pt denies reproduction of back pain with CPA L1-L5 and UPA bilaterally L1-L5. Generally, hypomobile throughout  Special Tests Lumbar Radiculopathy and Discogenic: Centralization and Peripheralization (SN 92, -LR 0.12): Not examined Slump (SN 83, -LR 0.32): R: Not examined L: Not examined SLR (SN 92, -LR 0.29): R: Negative L:  Negative Crossed SLR (SP 90): R: Negative L: Negative  Facet Joint: Extension-Rotation (SN 100, -LR 0.0): R: Positive L: Not examined  Lumbar Foraminal Stenosis: Lumbar quadrant (SN 70): R: Positive L: Not examined  Hip: FABER (SN 81): R: Not examined L: Not examined FADIR (SN 94): R: Not examined L: Not examined Hip scour (SN 50): R: Not examined L: Not examined  TODAY'S TREATMENT: DATE: 04/07/24    Subjective: Patient reports 5-6/10 pain in low back. Has been working out in the yard pulling figs the past 2 days.    Therapeutic Exercise (focused on strengthening core stability through abdominal/hip muscles):   Seated row (OMEGA)   3 x 10 - 25#    Seated chest press (OMEGA)   1 x 10 - 10#; 2 x 10 5#    Seated forward flexion stretch with blue physioball x 20 - towards L/R x 10 each direction   Seated Med Ball Torso Rotation   2 x 20 - without LE Support - 2 Kg MB    Therapeutic Activity (improving multiple parameters/joints in order to optimize household ADLs and yard work):   NuStep L5-2 x 6 min x UE/LE (Seat 7) for LE endurance and strength for walking capacity; PT manually adjusted resistance per patient tolerance    Standing Pallof Press (OMEGA)  3 x 10 each direction - 5#     PATIENT EDUCATION:  Education details: Exercise Technique, HEP Person educated: Patient Education method: Explanation, Demonstration, and Handouts Education comprehension: verbalized understanding and returned demonstration   HOME EXERCISE PROGRAM:  Access Code: [email or web address] URL: [email or web address] Date: 03/17/2024 Prepared by: Lonni Pall  Exercises - Supine Bilateral Hip Internal Rotation Stretch  - 1 x daily - 7 x weekly - 2-3 sets - 30s hold - Supine Lower Trunk Rotation  - 1 x daily - 7 x weekly - 2-3 sets - 10 reps - Supine Bridge  - 1 x daily - 3-4 x weekly - 2-3 sets - 10-12 reps - Supine Double Bent Leg Lift  - 1 x daily - 3-4 x weekly - 2-3 sets - 10-12 reps - Supine 90/90 Overhead Dumbbell Raise  - 1 x daily - 3-4 x weekly - 2-3 sets - 10 reps -  Supine 90/90 with Leg Extensions  - 1 x daily - 3-4 x weekly - 2-3 sets - 10 reps - Standing Posterior Pelvic Tilt  - 1 x daily - 3-4 x weekly - 2-3 sets - 10-12 reps  Access Code: [email or web address] URL: [email or web address] Date: 03/09/2024 Prepared by: Lonni Go  Exercises - Supine Bilateral Hip Internal Rotation Stretch  - 1 x daily - 7 x weekly - 2-3 sets - 30s hold - Supine Lower Trunk Rotation  - 1 x daily - 7 x weekly - 2-3 sets - 10 reps - Supine Bridge  - 1 x daily - 3-4 x weekly - 2-3 sets - 10-12 reps - Supine Double Bent Leg Lift  - 1 x daily - 3-4 x weekly - 2-3 sets - 10-12 reps   ASSESSMENT:  CLINICAL IMPRESSION:    Continued PT POC focused on core strengthen for improved lumbar stability. PT focused on core stability with UE strengthening. Good tolerance to activity this date but activity continues to exacerbate her pain. PT continued to strongly encourage reduction in household work and activities in order to reduce further injury to recent Reverse TSR and lower back pain.  Based on today's performance, pt will  continue to benefit from skilled PT in order to facilitate return to PLOF and improve QoL.   OBJECTIVE IMPAIRMENTS: decreased activity tolerance, decreased ROM, decreased strength, and pain.   ACTIVITY LIMITATIONS: lifting, bending, standing, squatting, transfers, and caring for others  PARTICIPATION LIMITATIONS: driving and yard work  PERSONAL FACTORS: Age, Behavior pattern, Past/current experiences, Time since onset of injury/illness/exacerbation, and 1-2 comorbidities: s/p L Reverse TSR, s/p bilateral TKR  are also affecting patient's functional outcome.   REHAB POTENTIAL: Fair Chronicity  CLINICAL DECISION MAKING: Evolving/moderate complexity  EVALUATION COMPLEXITY: Moderate   GOALS: Goals reviewed with patient? No  SHORT TERM GOALS: Target date: 04/06/2024   Pt will be independent with HEP in order to improve strength and decrease back pain to improve pain-free function at home and work. Baseline: 03/09/2024: initial HEP Goal status: INITIAL   LONG TERM GOALS: Target date: 05/04/2024   Pt will increase by at least 1.1 m/s in order to demonstrate clinically significant improvement in community ambulation. Baseline: 03/09/2024: .97 m/s Goal status: INITIAL  2.  Pt will decrease worst back pain by at least 2 points on the NPRS in order to demonstrate clinically significant reduction in back pain. Baseline: 03/09/2024: 10/10 NPS Goal status: INITIAL  3.  Pt will decrease mODI score by at least 13 points in order demonstrate clinically significant reduction in back pain/disability.       Baseline: 03/09/2024: 15/50 -30% Goal status: INITIAL  4.  Pt will increase R Hip IR to 35 in order to demonstrate significant improvements in hip mobility and limb symmetry.  Baseline: 03/09/2024: R IR: 25 Goal status: INITIAL   PLAN:  PT FREQUENCY: 1-2x/week  PT DURATION: 8 weeks  PLANNED INTERVENTIONS: [id number]- PT Re-evaluation, 97110-Therapeutic exercises, 97530-  Therapeutic activity, 97112- Neuromuscular re-education, 97535- Self Care, [id number]- Manual therapy, Joint mobilization, Cryotherapy, and Moist heat  PLAN FOR NEXT SESSION: Review HEP; progress core stabilization, flexion-based exercises, hip strengthening, posterior pelvic tilting  Maryanne Finder, PT, DPT Physical Therapist - [facility]  [facility]  04/07/2024, 3:20 PM

## 2024-04-07 ENCOUNTER — Ambulatory Visit

## 2024-04-07 DIAGNOSIS — R262 Difficulty in walking, not elsewhere classified: Secondary | ICD-10-CM | POA: Diagnosis not present

## 2024-04-07 DIAGNOSIS — M5459 Other low back pain: Secondary | ICD-10-CM

## 2024-04-07 DIAGNOSIS — M6281 Muscle weakness (generalized): Secondary | ICD-10-CM | POA: Diagnosis not present

## 2024-04-08 NOTE — Therapy (Signed)
 OUTPATIENT PHYSICAL THERAPY THORACOLUMBAR TREATMENT &  Physical Therapy Progress Note   Dates of reporting period  03/09/24   to   04/09/24   Patient Name: Krystal [name] MRN: [id number] DOB:[birth year], 73 y.o., female Today's Date: 04/08/2024  END OF SESSION:      Past Medical History:  Diagnosis Date   Anemia    Arthritis    osteo   Blindness of right eye at birth   Cancer [facility]) 2011   Left breast 2011 and cervical age 69   Carpal tunnel syndrome    Cataract    Depression    Edema 07/27/2011   Fibromyalgia    muscle weakness and pain   GERD (gastroesophageal reflux disease)    Hyperlipidemia    Hypertension    benign   Lymphedema    Osteoporosis    Plantar fasciitis    Pre-diabetes    Raynaud's disease    Restless leg syndrome    Rosacea    Rotator cuff rupture    Sleep apnea    Synovitis    Ulcer    Past Surgical History:  Procedure Laterality Date   ABDOMINAL HYSTERECTOMY  1986   For bleeding and pain   ATRIAL FIBRILLATION ABLATION N/A 05/14/2023   Procedure: ATRIAL FIBRILLATION ABLATION;  Surgeon: Krystal Ole DASEN, MD;  Location: [facility];  Service: Cardiovascular;  Laterality: N/A;   BLADDER SURGERY  2006   Bladder tack   BREAST RECONSTRUCTION Bilateral 09/17/2012   Procedure: BREAST RECONSTRUCTION;  Surgeon: Krystal Reichert, DO;  Location: [facility];  Service: Plastics;  Laterality: Bilateral;  BILATERAL BREAST RECONSTRUCTION WITH TISSUE EXPANDERS AND ALLOMED   BREAST RECONSTRUCTION Right 06/03/2013   Procedure: RIGHT BREAST CAPSULE CONSTRACTURE;  Surgeon: Krystal Reichert, DO;  Location: [facility];  Service: Plastics;  Laterality: Right;   CAPSULECTOMY Right 11/18/2023   Procedure: EXCISION OF RIGHT BREAST CAPSULE CONTRACTURE AND BREAST TISSUE;  Surgeon: Krystal Krystal RAMAN, DO;  Location: [facility];  Service: Plastics;  Laterality: Right;   CARDIOVERSION N/A 08/06/2022   Procedure: CARDIOVERSION;   Surgeon: Krystal Deatrice LABOR, MD;  Location: [facility];  Service: Cardiovascular;  Laterality: N/A;   CARDIOVERSION N/A 10/02/2022   Procedure: CARDIOVERSION;  Surgeon: Krystal Bruckner, MD;  Location: [facility];  Service: Cardiovascular;  Laterality: N/A;   CARPAL TUNNEL RELEASE Bilateral 2008   EYE SURGERY Right    INJECTION KNEE Left 08/12/2018   Procedure: KNEE INJECTION-LEFT;  Surgeon: Krystal Norleen PARAS, MD;  Location: [facility];  Service: Orthopedics;  Laterality: Left;   JOINT REPLACEMENT     LIPOSUCTION Bilateral 01/29/2013   Procedure: LIPOSUCTION;  Surgeon: Krystal Reichert, DO;  Location: [facility];  Service: Plastics;  Laterality: Bilateral;   LIPOSUCTION Right 06/03/2013   Procedure: LIPOSUCTION;  Surgeon: Krystal Reichert, DO;  Location: [facility];  Service: Plastics;  Laterality: Right;   LIPOSUCTION Right 11/18/2023   Procedure: LIPOSUCTION LATERAL RIGHT CHEST;  Surgeon: Krystal Krystal RAMAN, DO;  Location:  SURGERY CENTER;  Service: Plastics;  Laterality: Right;   MASTECTOMY  2012   rt prophalactic mast-snbx   MASTECTOMY MODIFIED RADICAL  2012   left-axillary nodes   NEUROMA SURGERY Bilateral    NOSE SURGERY  2007   PORT-A-CATH REMOVAL     insertion and   REVERSE SHOULDER ARTHROPLASTY Left 01/16/2024   Procedure: ARTHROPLASTY, SHOULDER, TOTAL, REVERSE;  Surgeon: Krystal Drivers, MD;  Location: [facility];  Service: Orthopedics;  Laterality: Left;  REVISION, RECONSTRUCTION, BREAST Right 11/18/2023   Procedure: REVISION RIGHT BREAST RECONSTRUCTION;  Surgeon: Krystal Krystal RAMAN, DO;  Location: [facility];  Service: Plastics;  Laterality: Right;  Excision of scar, capsule contracture right breast , possible liposuction   THROAT SURGERY     TONSILLECTOMY     TOTAL KNEE ARTHROPLASTY Right 08/12/2018   Procedure: TOTAL KNEE ARTHROPLASTY-RIGHT;  Surgeon: Krystal Norleen PARAS, MD;  Location: [facility];  Service: Orthopedics;  Laterality: Right;   TOTAL  KNEE ARTHROPLASTY Left 09/22/2019   Procedure: TOTAL KNEE ARTHROPLASTY;  Surgeon: Krystal Norleen PARAS, MD;  Location: [facility];  Service: Orthopedics;  Laterality: Left;   UVULOPALATOPHARYNGOPLASTY     Patient Active Problem List   Diagnosis Date Noted   Raynaud's disease 11/05/2022   Osteoporosis, post-menopausal 11/05/2022   Atrial flutter (HCC) 10/02/2022   First degree AV block 09/11/2022   Arthritis 08/08/2022   Persistent atrial fibrillation (HCC) 08/06/2022   Depression 02/27/2022   Gastroesophageal reflux disease 02/27/2022   Hypertension 02/27/2022   Abnormal LFTs 02/27/2022   Alcohol  abuse 02/27/2022   Atrial fibrillation with RVR (HCC) 02/27/2022   Morbid obesity (HCC) 02/27/2022   Pes anserinus bursitis of left knee 02/08/2020   Status post total knee replacement using cement, left 09/22/2019   Status post total knee replacement using cement, right 08/12/2018   Rotator cuff rupture    Status post bilateral breast reconstruction 02/06/2013   Acquired absence of bilateral breasts and nipples 09/17/2012   Breast cancer of lower-outer quadrant of left female breast (HCC) 01/16/2011    PCP: Krystal Agent, MD  REFERRING PROVIDER: Bonner Ade, MD  REFERRING DIAG:  M54.16 (ICD-10-CM) - Radiculopathy, lumbar region   RATIONALE FOR EVALUATION AND TREATMENT: Rehabilitation  THERAPY DIAG: Other low back pain  Muscle weakness (generalized)  Difficulty in walking, not elsewhere classified  ONSET DATE: Chronic  FOLLOW-UP APPT SCHEDULED WITH REFERRING PROVIDER: Yes    SUBJECTIVE:                                                                                                                                                                                         SUBJECTIVE STATEMENT:    Patient reporting to OPPT with a chief concern of lower back pain.   PERTINENT HISTORY:   Krystal [name] is a 73 y.o. female presenting to OPPT with chronic lower back pain. She is  familiar to this clinic and has recently d/c from PT for reverse total shoulder replacement (L).  Patient reports that 8-9 mos ago she thought sciatic pain however she realized the pain was originating from the back. Patient reports death in family (father)  1 yr ago; she reports that she was the primary caregiver for two years. She reports that her lower back pain radiates into both of her legs (R > L); pain stops at the knees.   Aggravating factors: Prolonged sitting in a straight chair; reaching for weeds;  Relieving factors: Leaning forward, bending forward; tylenol    She denies saddle paraesthesia, changes in bowel/bladder, acute trauma to lumbar spine.    Imaging: (per chart review 03/09/2024):  Two-view lumbar spine X-ray, PA and lateral, performed in the office today and personally reviewed. The patient does not have any significant scoliosis. Minimal arthritis is noted in the hip joints, with no arthritis in the sacroiliac joints. The patient has five non-rib-bearing lumbar vertebrae. Grade 1 spondylolisthesis is observed at L3-4 and L4-5. No compression fractures are noted.    PAIN:    Pain Intensity: Present: 6/10, Best: 5/10, Worst: 8/10 Pain location: R Lower back with intermittent L involvement  Pain Quality: constant, burning, and real hot  Radiating: Yes  Numbness/Tingling: No  PRECAUTIONS: None  WEIGHT BEARING RESTRICTIONS: No  FALLS: Has patient fallen in last 6 months? No  Living Environment Lives with: lives with their spouse Lives in: House/apartment Stairs: Yes: Internal: 13 steps; on right going up Has following equipment at home: None  Prior level of function: Independent  Occupational demands: Retired  Hobbies: 44 yr old dog; Cleaning  Patient Goals: I would like to stand up straight without pain    OBJECTIVE:  Patient Surveys  mODI: 15/50 - 30%  Cognition WNL    Gross Musculoskeletal Assessment Tremor: None Bulk: Normal Tone: Normal No  visible step-off along spinal column, no signs of scoliosis  GAIT: Distance walked: 75m  Assistive device utilized: None Level of assistance: Complete Independence Comments: Forward Trunk Lean  Posture: Lumbar lordosis: WNL Iliac crest height: Equal bilaterally Lumbar lateral shift: Negative  AROM AROM (Normal range in degrees) AROM   Lumbar   Flexion (65) 100%  Extension (30) 100%  Right lateral flexion (25) 100%   Left lateral flexion (25) 100%  Right rotation (30) 100%   Left rotation (30) 100%       Hip Right Left  Flexion (125) 110 110  Extension (15)    Abduction (40)    Adduction     Internal Rotation (45) 25 35  External Rotation (45) 30 30      Knee    Flexion (135) WNL WNL  Extension (0) WNL WNL      Ankle    Dorsiflexion (20) WNL WNL  Plantarflexion (50)    Inversion (35)    Eversion (15)    (* = pain; Blank rows = not tested)  LE MMT: MMT (out of 5) Right  Left   Hip flexion 4- 4-  Hip extension    Hip abduction 4- 4-  Hip adduction    Hip internal rotation 4+ 4+  Hip external rotation 4+ 4+  Knee flexion 5 5  Knee extension 5 5  Ankle dorsiflexion 5 5  Ankle plantarflexion 5 5  Ankle inversion    Ankle eversion    (* = pain; Blank rows = not tested)  Sensation Grossly intact to light touch throughout bilateral LEs as determined by testing dermatomes L2-S2. Proprioception, stereognosis, and hot/cold testing deferred on this date.  Reflexes Deferred   Muscle Length Hamstrings: R: Negative L: Negative Thomas (hip flexors): R: Not examined L: Not examined  Palpation Location Right Left  Lumbar paraspinals 2 1  Quadratus Lumborum    Gluteus Maximus 2 1  Gluteus Medius    Deep hip external rotators 0 0  PSIS 0 0  Fortin's Area (SIJ)    Greater Trochanter 0 0  (Blank rows = not tested) Graded on 0-4 scale (0 = no pain, 1 = pain, 2 = pain with wincing/grimacing/flinching, 3 = pain with withdrawal, 4 = unwilling to allow  palpation)  Passive Accessory  Motion Pt denies reproduction of back pain with CPA L1-L5 and UPA bilaterally L1-L5. Generally, hypomobile throughout  Special Tests Lumbar Radiculopathy and Discogenic: Centralization and Peripheralization (SN 92, -LR 0.12): Not examined Slump (SN 83, -LR 0.32): R: Not examined L: Not examined SLR (SN 92, -LR 0.29): R: Negative L:  Negative Crossed SLR (SP 90): R: Negative L: Negative  Facet Joint: Extension-Rotation (SN 100, -LR 0.0): R: Positive L: Not examined  Lumbar Foraminal Stenosis: Lumbar quadrant (SN 70): R: Positive L: Not examined  Hip: FABER (SN 81): R: Not examined L: Not examined FADIR (SN 94): R: Not examined L: Not examined Hip scour (SN 50): R: Not examined L: Not examined  TODAY'S TREATMENT: DATE: 04/08/24   *** Subjective: Patient reports 5-6/10 pain in low back. Has been working out in the yard pulling figs the past 2 days.    Physical therapy treatment session today consisted of completing assessment of goals and administration of testing as demonstrated and documented in flow sheet, treatment, and goals section of this note. Addition treatments may be found below.   : Modified Oswestry:  Worst pain:  R Hip IR ROM:   Therapeutic Exercise (focused on strengthening core stability through abdominal/hip muscles):   Seated row (OMEGA)   3 x 10 - 25#    Seated chest press (OMEGA)   1 x 10 - 10#; 2 x 10 5#    Seated forward flexion stretch with blue physioball x 20 - towards L/R x 10 each direction   Seated Med Ball Torso Rotation   2 x 20 - without LE Support - 2 Kg MB    Therapeutic Activity (improving multiple parameters/joints in order to optimize household ADLs and yard work):   NuStep L5-2 x 6 min x UE/LE (Seat 7) for LE endurance and strength for walking capacity; PT manually adjusted resistance per patient tolerance   Standing Pallof Press (OMEGA)  3 x 10 each direction - 5#     PATIENT EDUCATION:   Education details: Exercise Technique, HEP Person educated: Patient Education method: Explanation, Demonstration, and Handouts Education comprehension: verbalized understanding and returned demonstration   HOME EXERCISE PROGRAM:  Access Code: [email or web address] URL: [email or web address] Date: 03/17/2024 Prepared by: Lonni Go  Exercises - Supine Bilateral Hip Internal Rotation Stretch  - 1 x daily - 7 x weekly - 2-3 sets - 30s hold - Supine Lower Trunk Rotation  - 1 x daily - 7 x weekly - 2-3 sets - 10 reps - Supine Bridge  - 1 x daily - 3-4 x weekly - 2-3 sets - 10-12 reps - Supine Double Bent Leg Lift  - 1 x daily - 3-4 x weekly - 2-3 sets - 10-12 reps - Supine 90/90 Overhead Dumbbell Raise  - 1 x daily - 3-4 x weekly - 2-3 sets - 10 reps - Supine 90/90 with Leg Extensions  - 1 x daily - 3-4 x weekly - 2-3 sets - 10 reps - Standing Posterior Pelvic Tilt  - 1 x daily - 3-4 x weekly -  2-3 sets - 10-12 reps  Access Code: [email or web address] URL: [email or web address] Date: 03/09/2024 Prepared by: Lonni Go  Exercises - Supine Bilateral Hip Internal Rotation Stretch  - 1 x daily - 7 x weekly - 2-3 sets - 30s hold - Supine Lower Trunk Rotation  - 1 x daily - 7 x weekly - 2-3 sets - 10 reps - Supine Bridge  - 1 x daily - 3-4 x weekly - 2-3 sets - 10-12 reps - Supine Double Bent Leg Lift  - 1 x daily - 3-4 x weekly - 2-3 sets - 10-12 reps   ASSESSMENT:  CLINICAL IMPRESSION:    Continued PT POC focused on core strengthen for improved lumbar stability. PT focused on core stability with UE strengthening. Good tolerance to activity this date but activity continues to exacerbate her pain. PT continued to strongly encourage reduction in household work and activities in order to reduce further injury to recent Reverse TSR and lower back pain.  Based on today's performance, pt will continue to benefit from skilled PT in order to facilitate return to PLOF and improve QoL.    OBJECTIVE IMPAIRMENTS: decreased activity tolerance, decreased ROM, decreased strength, and pain.   ACTIVITY LIMITATIONS: lifting, bending, standing, squatting, transfers, and caring for others  PARTICIPATION LIMITATIONS: driving and yard work  PERSONAL FACTORS: Age, Behavior pattern, Past/current experiences, Time since onset of injury/illness/exacerbation, and 1-2 comorbidities: s/p L Reverse TSR, s/p bilateral TKR  are also affecting patient's functional outcome.   REHAB POTENTIAL: Fair Chronicity  CLINICAL DECISION MAKING: Evolving/moderate complexity  EVALUATION COMPLEXITY: Moderate   GOALS: Goals reviewed with patient? No  SHORT TERM GOALS: Target date: 04/06/2024   Pt will be independent with HEP in order to improve strength and decrease back pain to improve pain-free function at home and work. Baseline: 03/09/2024: initial HEP Goal status: INITIAL   LONG TERM GOALS: Target date: 05/04/2024   Pt will increase by at least 1.1 m/s in order to demonstrate clinically significant improvement in community ambulation. Baseline: 03/09/2024: .97 m/s Goal status: INITIAL  2.  Pt will decrease worst back pain by at least 2 points on the NPRS in order to demonstrate clinically significant reduction in back pain. Baseline: 03/09/2024: 10/10 NPS Goal status: INITIAL  3.  Pt will decrease mODI score by at least 13 points in order demonstrate clinically significant reduction in back pain/disability.       Baseline: 03/09/2024: 15/50 -30% Goal status: INITIAL  4.  Pt will increase R Hip IR to 35 in order to demonstrate significant improvements in hip mobility and limb symmetry.  Baseline: 03/09/2024: R IR: 25 Goal status: INITIAL   PLAN:  PT FREQUENCY: 1-2x/week  PT DURATION: 8 weeks  PLANNED INTERVENTIONS: [id number]- PT Re-evaluation, 97110-Therapeutic exercises, 97530- Therapeutic activity, 97112- Neuromuscular re-education, 97535- Self Care, [id number]- Manual therapy,  Joint mobilization, Cryotherapy, and Moist heat  PLAN FOR NEXT SESSION: Review HEP; progress core stabilization, flexion-based exercises, hip strengthening, posterior pelvic tilting  Maryanne Finder, PT, DPT Physical Therapist - [facility]  [facility]  04/08/2024, 2:21 PM

## 2024-04-09 ENCOUNTER — Ambulatory Visit

## 2024-04-09 DIAGNOSIS — R262 Difficulty in walking, not elsewhere classified: Secondary | ICD-10-CM

## 2024-04-09 DIAGNOSIS — M6281 Muscle weakness (generalized): Secondary | ICD-10-CM | POA: Diagnosis not present

## 2024-04-09 DIAGNOSIS — M5459 Other low back pain: Secondary | ICD-10-CM | POA: Diagnosis not present

## 2024-04-13 DIAGNOSIS — M150 Primary generalized (osteo)arthritis: Secondary | ICD-10-CM | POA: Diagnosis not present

## 2024-04-13 DIAGNOSIS — M51362 Other intervertebral disc degeneration, lumbar region with discogenic back pain and lower extremity pain: Secondary | ICD-10-CM | POA: Diagnosis not present

## 2024-04-13 DIAGNOSIS — M797 Fibromyalgia: Secondary | ICD-10-CM | POA: Diagnosis not present

## 2024-04-14 ENCOUNTER — Other Ambulatory Visit: Payer: Self-pay | Admitting: Physical Medicine and Rehabilitation

## 2024-04-14 ENCOUNTER — Ambulatory Visit

## 2024-04-14 ENCOUNTER — Inpatient Hospital Stay: Payer: Medicare HMO | Attending: Adult Health | Admitting: Adult Health

## 2024-04-14 VITALS — BP 136/60 | HR 70 | Temp 97.9°F | Resp 18 | Wt 171.8 lb

## 2024-04-14 DIAGNOSIS — Z08 Encounter for follow-up examination after completed treatment for malignant neoplasm: Secondary | ICD-10-CM | POA: Insufficient documentation

## 2024-04-14 DIAGNOSIS — Z853 Personal history of malignant neoplasm of breast: Secondary | ICD-10-CM | POA: Insufficient documentation

## 2024-04-14 DIAGNOSIS — G8929 Other chronic pain: Secondary | ICD-10-CM | POA: Diagnosis not present

## 2024-04-14 DIAGNOSIS — Z9221 Personal history of antineoplastic chemotherapy: Secondary | ICD-10-CM | POA: Insufficient documentation

## 2024-04-14 DIAGNOSIS — Z9013 Acquired absence of bilateral breasts and nipples: Secondary | ICD-10-CM | POA: Diagnosis not present

## 2024-04-14 DIAGNOSIS — Z807 Family history of other malignant neoplasms of lymphoid, hematopoietic and related tissues: Secondary | ICD-10-CM | POA: Diagnosis not present

## 2024-04-14 DIAGNOSIS — Z801 Family history of malignant neoplasm of trachea, bronchus and lung: Secondary | ICD-10-CM | POA: Insufficient documentation

## 2024-04-14 DIAGNOSIS — R140 Abdominal distension (gaseous): Secondary | ICD-10-CM | POA: Diagnosis not present

## 2024-04-14 DIAGNOSIS — M5459 Other low back pain: Secondary | ICD-10-CM

## 2024-04-14 DIAGNOSIS — M159 Polyosteoarthritis, unspecified: Secondary | ICD-10-CM | POA: Diagnosis not present

## 2024-04-14 DIAGNOSIS — Z90710 Acquired absence of both cervix and uterus: Secondary | ICD-10-CM | POA: Insufficient documentation

## 2024-04-14 DIAGNOSIS — Z800 Family history of malignant neoplasm of digestive organs: Secondary | ICD-10-CM | POA: Insufficient documentation

## 2024-04-14 DIAGNOSIS — Z170 Estrogen receptor positive status [ER+]: Secondary | ICD-10-CM

## 2024-04-14 DIAGNOSIS — F5104 Psychophysiologic insomnia: Secondary | ICD-10-CM | POA: Insufficient documentation

## 2024-04-14 DIAGNOSIS — C50512 Malignant neoplasm of lower-outer quadrant of left female breast: Secondary | ICD-10-CM | POA: Diagnosis not present

## 2024-04-14 DIAGNOSIS — Z803 Family history of malignant neoplasm of breast: Secondary | ICD-10-CM | POA: Insufficient documentation

## 2024-04-14 NOTE — Progress Notes (Unsigned)
 [facility] Cancer Follow up:    Krystal Agent, MD [address] [place] [address]   DIAGNOSIS: Cancer Staging  Breast cancer of lower-outer quadrant of left female breast [facility]) Staging form: Breast, AJCC 6th Edition - Clinical: Stage Unknown (TX, N1, MX) - Signed by Fernand Sharma POUR, MD on 08/31/2013 Histopathologic type: Infiltrating duct carcinoma, NOS Laterality: Left - Pathologic: No stage assigned - Unsigned Histopathologic type: Infiltrating duct carcinoma, NOS Laterality: Left    SUMMARY OF ONCOLOGIC HISTORY: Oncology History  Breast cancer of lower-outer quadrant of left female breast (HCC)  12/28/2010 Surgery   Bilateral mastectomies: Left: no residual cancer, 1/15 LN positive; Right: benign, 0/3 LN   01/29/2011 - 07/02/2011 Chemotherapy   adjuvant chemotherapy following NSABP B. 49 clinical trial. She received 4 cycles of dose dense Adriamycin and Cytoxan followed by weekly single [name] Taxol     07/31/2011 - 07/2021 Anti-estrogen oral therapy   tamoxifen  10 mg daily daily. A total of 10 years therapy is planned based on BCI testing inability to be performed to to insufficient tissue. She has also been on Aromasin  and Arimidex  in the past, and was unable to tolerate these      CURRENT THERAPY:  INTERVAL HISTORY:  Discussed the use of AI scribe software for clinical note transcription with the patient, who gave verbal consent to proceed.  History of Present Illness     Patient Active Problem List   Diagnosis Date Noted   Breast cancer of lower-outer quadrant of left female breast (HCC) 01/16/2011    Priority: High   Raynaud's disease 11/05/2022   Osteoporosis, post-menopausal 11/05/2022   Atrial flutter (HCC) 10/02/2022   First degree AV block 09/11/2022   Arthritis 08/08/2022   Persistent atrial fibrillation (HCC) 08/06/2022   Depression 02/27/2022   Gastroesophageal reflux disease 02/27/2022   Hypertension 02/27/2022    Abnormal LFTs 02/27/2022   Alcohol  abuse 02/27/2022   Atrial fibrillation with RVR (HCC) 02/27/2022   Morbid obesity (HCC) 02/27/2022   Pes anserinus bursitis of left knee 02/08/2020   Status post total knee replacement using cement, left 09/22/2019   Status post total knee replacement using cement, right 08/12/2018   Rotator cuff rupture    Status post bilateral breast reconstruction 02/06/2013   Acquired absence of bilateral breasts and nipples 09/17/2012    is allergic to cephalexin  and levaquin [levofloxacin].  MEDICAL HISTORY: Past Medical History:  Diagnosis Date   Anemia    Arthritis    osteo   Blindness of right eye at birth   Cancer [facility]) 2011   Left breast 2011 and cervical age 36   Carpal tunnel syndrome    Cataract    Depression    Edema 07/27/2011   Fibromyalgia    muscle weakness and pain   GERD (gastroesophageal reflux disease)    Hyperlipidemia    Hypertension    benign   Lymphedema    Osteoporosis    Plantar fasciitis    Pre-diabetes    Raynaud's disease    Restless leg syndrome    Rosacea    Rotator cuff rupture    Sleep apnea    Synovitis    Ulcer     SURGICAL HISTORY: Past Surgical History:  Procedure Laterality Date   ABDOMINAL HYSTERECTOMY  1986   For bleeding and pain   ATRIAL FIBRILLATION ABLATION N/A 05/14/2023   Procedure: ATRIAL FIBRILLATION ABLATION;  Surgeon: Cindie Ole DASEN, MD;  [facility];  Service: Cardiovascular;  Laterality: N/A;   BLADDER SURGERY  2006   Bladder tack   BREAST RECONSTRUCTION Bilateral 09/17/2012   Procedure: BREAST RECONSTRUCTION;  Surgeon: Estefana Reichert, DO;  Location: [facility];  Service: Plastics;  Laterality: Bilateral;  BILATERAL BREAST RECONSTRUCTION WITH TISSUE EXPANDERS AND ALLOMED   BREAST RECONSTRUCTION Right 06/03/2013   Procedure: RIGHT BREAST CAPSULE CONSTRACTURE;  Surgeon: Estefana Reichert, DO;  Location: [facility];  Service: Plastics;  Laterality:  Right;   CAPSULECTOMY Right 11/18/2023   Procedure: EXCISION OF RIGHT BREAST CAPSULE CONTRACTURE AND BREAST TISSUE;  Surgeon: Lowery Estefana RAMAN, DO;  Location: [facility];  Service: Plastics;  Laterality: Right;   CARDIOVERSION N/A 08/06/2022   Procedure: CARDIOVERSION;  Surgeon: Darron Deatrice LABOR, MD;  Location: [facility];  Service: Cardiovascular;  Laterality: N/A;   CARDIOVERSION N/A 10/02/2022   Procedure: CARDIOVERSION;  Surgeon: Mady Bruckner, MD;  Location: [facility];  Service: Cardiovascular;  Laterality: N/A;   CARPAL TUNNEL RELEASE Bilateral 2008   EYE SURGERY Right    INJECTION KNEE Left 08/12/2018   Procedure: KNEE INJECTION-LEFT;  Surgeon: Edie Norleen PARAS, MD;  Location: [facility];  Service: Orthopedics;  Laterality: Left;   JOINT REPLACEMENT     LIPOSUCTION Bilateral 01/29/2013   Procedure: LIPOSUCTION;  Surgeon: Estefana Reichert, DO;  Location: [facility];  Service: Plastics;  Laterality: Bilateral;   LIPOSUCTION Right 06/03/2013   Procedure: LIPOSUCTION;  Surgeon: Estefana Reichert, DO;  Location: [facility];  Service: Plastics;  Laterality: Right;   LIPOSUCTION Right 11/18/2023   Procedure: LIPOSUCTION LATERAL RIGHT CHEST;  Surgeon: Lowery Estefana RAMAN, DO;  Location: [facility];  Service: Plastics;  Laterality: Right;   MASTECTOMY  2012   rt prophalactic mast-snbx   MASTECTOMY MODIFIED RADICAL  2012   left-axillary nodes   NEUROMA SURGERY Bilateral    NOSE SURGERY  2007   PORT-A-CATH REMOVAL     insertion and   REVERSE SHOULDER ARTHROPLASTY Left 01/16/2024   Procedure: ARTHROPLASTY, SHOULDER, TOTAL, REVERSE;  Surgeon: Melita Drivers, MD;  Location: [facility];  Service: Orthopedics;  Laterality: Left;   REVISION, RECONSTRUCTION, BREAST Right 11/18/2023   Procedure: REVISION RIGHT BREAST RECONSTRUCTION;  Surgeon: Lowery Estefana RAMAN, DO;  Location:  SURGERY CENTER;  Service: Plastics;  Laterality: Right;  Excision of  scar, capsule contracture right breast , possible liposuction   THROAT SURGERY     TONSILLECTOMY     TOTAL KNEE ARTHROPLASTY Right 08/12/2018   Procedure: TOTAL KNEE ARTHROPLASTY-RIGHT;  Surgeon: Edie Norleen PARAS, MD;  Location: [facility];  Service: Orthopedics;  Laterality: Right;   TOTAL KNEE ARTHROPLASTY Left 09/22/2019   Procedure: TOTAL KNEE ARTHROPLASTY;  Surgeon: Edie Norleen PARAS, MD;  Location: [facility];  Service: Orthopedics;  Laterality: Left;   UVULOPALATOPHARYNGOPLASTY      SOCIAL HISTORY: Social History   Socioeconomic History   Marital status: Married    Spouse name: Not on file   Number of children: Not on file   Years of education: Not on file   Highest education level: Not on file  Occupational History   Not on file  Tobacco Use   Smoking status: Never    Passive exposure: Never   Smokeless tobacco: Never  Vaping Use   Vaping status: Never Used  Substance and Sexual Activity   Alcohol  use: Yes    Comment: occ wine   Drug use: No   Sexual activity: Yes  Other Topics Concern  Not on file  Social History Narrative   Not on file   Social Drivers of Health   Financial Resource Strain: Patient Declined (08/28/2023)   [facility]   Overall Financial Resource Strain (CARDIA)    Difficulty of Paying Living Expenses: Patient declined  Food Insecurity: Patient Declined (08/28/2023)   Received from [facility]   Hunger Vital Sign    Within the past 12 months, you worried that your food would run out before you got the money to buy more.: Patient declined    Within the past 12 months, the food you bought just didn't last and you didn't have money to get more.: Patient declined  Transportation Needs: Patient Declined (08/28/2023)   Received from [facility]   PRAPARE - Transportation    Lack of Transportation (Non-Medical): Patient declined    In the past 12 months, has lack of transportation kept you from  medical appointments or from getting medications?: Patient declined  Physical Activity: Not on file  Stress: Not on file  Social Connections: Not on file  Intimate Partner Violence: Not on file    FAMILY HISTORY: Family History  Problem Relation Age of Onset   Cancer Mother 6       non hodgkins lymphoma   Cancer Father        neck, throat, lung   Lung cancer Father    Throat cancer Father    Hypertension Sister    Cancer Sister 81       Breast cancer dx'd 04/2011   Hypertension Brother    Breast cancer Maternal Aunt    Liver cancer Maternal Uncle    Cancer Maternal Grandmother        died 41, unknown cancer   Cancer Maternal Grandfather        died in his 63s; unknown cancer   Lung cancer Maternal Uncle    Throat cancer Maternal Uncle    Breast cancer Maternal Aunt    Cancer Cousin        died under the age 53, unknown cancer   Cancer Cousin        died age 31; unknown cancer    Review of Systems  Constitutional:  Negative for appetite change, chills, fatigue, fever and unexpected weight change.  HENT:   Negative for hearing loss, lump/mass and trouble swallowing.   Eyes:  Negative for eye problems and icterus.  Respiratory:  Negative for chest tightness, cough and shortness of breath.   Cardiovascular:  Negative for chest pain, leg swelling and palpitations.  Gastrointestinal:  Negative for abdominal distention, abdominal pain, constipation, diarrhea, nausea and vomiting.  Endocrine: Negative for hot flashes.  Genitourinary:  Negative for difficulty urinating.   Musculoskeletal:  Negative for arthralgias.  Skin:  Negative for itching and rash.  Neurological:  Negative for dizziness, extremity weakness, headaches and numbness.  Hematological:  Negative for adenopathy. Does not bruise/bleed easily.  Psychiatric/Behavioral:  Negative for depression. The patient is not nervous/anxious.       PHYSICAL EXAMINATION    Vitals:   04/14/24 1022  BP: 136/60  Pulse:  70  Resp: 18  Temp: 97.9 F (36.6 C)  SpO2: 100%    Physical Exam Constitutional:      General: She is not in acute distress.    Appearance: Normal appearance. She is not toxic-appearing.  HENT:     Head: Normocephalic and atraumatic.     Mouth/Throat:  Mouth: Mucous membranes are moist.     Pharynx: Oropharynx is clear. No oropharyngeal exudate or posterior oropharyngeal erythema.  Eyes:     General: No scleral icterus. Cardiovascular:     Rate and Rhythm: Normal rate and regular rhythm.     Pulses: Normal pulses.     Heart sounds: Normal heart sounds.  Pulmonary:     Effort: Pulmonary effort is normal.     Breath sounds: Normal breath sounds.  Chest:     Comments: S/p bilateral mastectomies, no sign of local recurrence Abdominal:     General: Abdomen is flat. Bowel sounds are normal. There is no distension.     Palpations: Abdomen is soft.     Tenderness: There is no abdominal tenderness.  Musculoskeletal:        General: No swelling.     Cervical back: Neck supple.  Lymphadenopathy:     Cervical: No cervical adenopathy.     Upper Body:     Right upper body: No supraclavicular or axillary adenopathy.     Left upper body: No supraclavicular or axillary adenopathy.  Skin:    General: Skin is warm and dry.     Findings: No rash.  Neurological:     General: No focal deficit present.     Mental Status: She is alert.  Psychiatric:        Mood and Affect: Mood normal.        Behavior: Behavior normal.     LABORATORY DATA:  CBC    Component Value Date/Time   WBC 5.8 01/15/2024 0843   RBC 4.89 01/15/2024 0843   HGB 12.8 01/15/2024 0843   HGB 14.2 04/03/2019 1027   HGB 14.3 04/01/2017 1447   HCT 41.7 01/15/2024 0843   HCT 42.3 04/01/2017 1447   PLT 255 01/15/2024 0843   PLT 209 04/03/2019 1027   PLT 219 04/01/2017 1447   MCV 85.3 01/15/2024 0843   MCV 94.2 04/01/2017 1447   MCH 26.2 01/15/2024 0843   MCHC 30.7 01/15/2024 0843   RDW 16.6 (H) 01/15/2024  0843   RDW 13.5 04/01/2017 1447   LYMPHSABS 1.4 05/08/2023 0917   LYMPHSABS 2.1 04/01/2017 1447   MONOABS 0.7 05/08/2023 0917   MONOABS 0.5 04/01/2017 1447   EOSABS 0.2 05/08/2023 0917   EOSABS 0.4 04/01/2017 1447   BASOSABS 0.1 05/08/2023 0917   BASOSABS 0.0 04/01/2017 1447    CMP     Component Value Date/Time   NA 141 01/15/2024 0925   NA 139 01/15/2024 0843   NA 141 04/01/2017 1447   K 4.2 01/15/2024 0925   K 3.3 (L) 04/01/2017 1447   CL 101 01/15/2024 0925   CL 102 11/17/2012 0937   CO2 23 01/15/2024 0925   CO2 27 04/01/2017 1447   GLUCOSE 64 (L) 01/15/2024 0925   GLUCOSE 79 01/15/2024 0843   GLUCOSE 105 04/01/2017 1447   GLUCOSE 106 (H) 11/17/2012 0937   BUN 17 01/15/2024 0925   BUN 18 01/15/2024 0843   BUN 9.1 04/01/2017 1447   CREATININE 1.03 (H) 01/15/2024 0925   CREATININE 1.14 (H) 04/03/2019 1027   CREATININE 1.0 04/01/2017 1447   CALCIUM  8.8 01/15/2024 0925   CALCIUM  9.4 04/01/2017 1447   PROT 7.2 06/12/2023 0936   PROT 7.3 04/01/2017 1447   ALBUMIN 4.5 06/12/2023 0936   ALBUMIN 4.1 04/01/2017 1447   AST 26 06/12/2023 0936   AST 136 (H) 04/03/2019 1027   AST 43 (H) 04/01/2017 1447   ALT  16 06/12/2023 0936   ALT 67 (H) 04/03/2019 1027   ALT 41 04/01/2017 1447   ALKPHOS 93 06/12/2023 0936   ALKPHOS 57 04/01/2017 1447   BILITOT 0.4 06/12/2023 0936   BILITOT 1.0 04/03/2019 1027   BILITOT 0.64 04/01/2017 1447   GFRNONAA >60 01/15/2024 0843   GFRNONAA 50 (L) 04/03/2019 1027   GFRAA 54 (L) 09/24/2019 0544   GFRAA 58 (L) 04/03/2019 1027     ASSESSMENT and THERAPY PLAN:   Assessment and Plan Assessment & Plan       All questions were answered. The patient knows to call the clinic with any problems, questions or concerns. We can certainly see the patient much sooner if necessary.  Total encounter time:*** minutes*in face-to-face visit time, chart review, lab review, care coordination, order entry, and documentation of the encounter  time.    Morna Kendall, NP 04/14/24 10:40 AM Medical Oncology and Hematology [facility] [address] [place], [place] [address] Tel. [phone]    Fax. [phone]  *Total Encounter Time as defined by the Centers for Medicare and Medicaid Services includes, in addition to the face-to-face time of a patient visit (documented in the note above) non-face-to-face time: obtaining and reviewing outside history, ordering and reviewing medications, tests or procedures, care coordination (communications with other health care professionals or caregivers) and documentation in the medical record.

## 2024-04-16 ENCOUNTER — Ambulatory Visit

## 2024-04-21 ENCOUNTER — Ambulatory Visit

## 2024-04-21 ENCOUNTER — Ambulatory Visit
Admission: RE | Admit: 2024-04-21 | Discharge: 2024-04-21 | Disposition: A | Discharge status: Home / Self Care | Source: Ambulatory Visit | Attending: Physical Medicine and Rehabilitation | Admitting: Physical Medicine and Rehabilitation

## 2024-04-21 DIAGNOSIS — M48061 Spinal stenosis, lumbar region without neurogenic claudication: Secondary | ICD-10-CM | POA: Diagnosis not present

## 2024-04-21 DIAGNOSIS — M5126 Other intervertebral disc displacement, lumbar region: Secondary | ICD-10-CM | POA: Diagnosis not present

## 2024-04-21 DIAGNOSIS — M5459 Other low back pain: Secondary | ICD-10-CM | POA: Diagnosis not present

## 2024-04-21 DIAGNOSIS — M47816 Spondylosis without myelopathy or radiculopathy, lumbar region: Secondary | ICD-10-CM | POA: Diagnosis not present

## 2024-04-21 DIAGNOSIS — M2548 Effusion, other site: Secondary | ICD-10-CM | POA: Diagnosis not present

## 2024-04-23 ENCOUNTER — Encounter

## 2024-05-01 ENCOUNTER — Other Ambulatory Visit
Admission: RE | Admit: 2024-05-01 | Discharge: 2024-05-01 | Disposition: A | Discharge status: Home / Self Care | Attending: Physical Medicine and Rehabilitation | Admitting: Physical Medicine and Rehabilitation

## 2024-05-01 DIAGNOSIS — M5416 Radiculopathy, lumbar region: Secondary | ICD-10-CM | POA: Insufficient documentation

## 2024-05-01 LAB — CBC WITH DIFFERENTIAL/PLATELET
Abs Immature Granulocytes: 0.03 K/uL (ref 0.00–0.07)
Basophils Absolute: 0 K/uL (ref 0.0–0.1)
Basophils Relative: 1 %
Eosinophils Absolute: 0.4 K/uL (ref 0.0–0.5)
Eosinophils Relative: 7 %
HCT: 39.5 % (ref 36.0–46.0)
Hemoglobin: 12.5 g/dL (ref 12.0–15.0)
Immature Granulocytes: 0 %
Lymphocytes Relative: 30 %
Lymphs Abs: 2 K/uL (ref 0.7–4.0)
MCH: 25.4 pg — ABNORMAL LOW (ref 26.0–34.0)
MCHC: 31.6 g/dL (ref 30.0–36.0)
MCV: 80.3 fL (ref 80.0–100.0)
Monocytes Absolute: 0.7 K/uL (ref 0.1–1.0)
Monocytes Relative: 11 %
Neutro Abs: 3.5 K/uL (ref 1.7–7.7)
Neutrophils Relative %: 51 %
Platelets: 283 K/uL (ref 150–400)
RBC: 4.92 MIL/uL (ref 3.87–5.11)
RDW: 15.9 % — ABNORMAL HIGH (ref 11.5–15.5)
WBC: 6.8 K/uL (ref 4.0–10.5)
nRBC: 0 % (ref 0.0–0.2)

## 2024-05-01 LAB — C-REACTIVE PROTEIN: CRP: 0.5 mg/dL (ref <1.0)

## 2024-05-01 LAB — SEDIMENTATION RATE: Sed Rate: 17 mm/h (ref 0–30)

## 2024-05-03 ENCOUNTER — Other Ambulatory Visit: Payer: Self-pay | Admitting: Internal Medicine

## 2024-05-03 DIAGNOSIS — I4819 Other persistent atrial fibrillation: Secondary | ICD-10-CM

## 2024-05-04 DIAGNOSIS — Z96612 Presence of left artificial shoulder joint: Secondary | ICD-10-CM | POA: Diagnosis not present

## 2024-05-04 DIAGNOSIS — Z471 Aftercare following joint replacement surgery: Secondary | ICD-10-CM | POA: Diagnosis not present

## 2024-05-04 NOTE — Telephone Encounter (Signed)
 Eliquis  5mg  refill request received. Patient is 73 years old, weight-77.9kg, Crea-1.03 on 01/15/24, Diagnosis-Afib, and last seen by Dr. Darron on 03/24/24. Dose is appropriate based on dosing criteria. Will send in refill to requested pharmacy.

## 2024-05-21 DIAGNOSIS — M48062 Spinal stenosis, lumbar region with neurogenic claudication: Secondary | ICD-10-CM | POA: Diagnosis not present

## 2024-05-21 DIAGNOSIS — M5416 Radiculopathy, lumbar region: Secondary | ICD-10-CM | POA: Diagnosis not present

## 2024-05-21 DIAGNOSIS — I482 Chronic atrial fibrillation, unspecified: Secondary | ICD-10-CM | POA: Diagnosis not present

## 2024-05-21 DIAGNOSIS — D6869 Other thrombophilia: Secondary | ICD-10-CM | POA: Diagnosis not present

## 2024-05-25 DIAGNOSIS — E118 Type 2 diabetes mellitus with unspecified complications: Secondary | ICD-10-CM | POA: Diagnosis not present

## 2024-05-25 DIAGNOSIS — I10 Essential (primary) hypertension: Secondary | ICD-10-CM | POA: Diagnosis not present

## 2024-05-29 NOTE — Progress Notes (Addendum)
 Krystal [name]                                          MRN: [id number]   05/29/2024   The [facility] Quality Team Specialist reviewed this patient medical record for the purposes of chart review for care gap closure. The following were reviewed: abstraction for care gap closure-glycemic status assessment.  08/13/2024- ABSTRACTED CBP 2025    [facility] Quality Team

## 2024-06-02 DIAGNOSIS — M5416 Radiculopathy, lumbar region: Secondary | ICD-10-CM | POA: Diagnosis not present

## 2024-06-12 DIAGNOSIS — M5416 Radiculopathy, lumbar region: Secondary | ICD-10-CM | POA: Diagnosis not present

## 2024-06-17 ENCOUNTER — Telehealth: Payer: Self-pay

## 2024-06-17 NOTE — Telephone Encounter (Signed)
 Patient called reporting she noticed a mass on the right breast. She requests evaluation by Morna Kendall, DNP, and a chest X-ray order.  Appointment scheduled for 12/9:  Nurse evaluation  Visit with Morna Kendall, DNP, beginning at 0940/1000

## 2024-06-30 ENCOUNTER — Encounter: Payer: Self-pay | Admitting: Adult Health

## 2024-06-30 ENCOUNTER — Inpatient Hospital Stay: Attending: Adult Health | Admitting: Adult Health

## 2024-06-30 ENCOUNTER — Inpatient Hospital Stay: Admitting: Adult Health

## 2024-06-30 VITALS — BP 138/68 | HR 86 | Temp 97.9°F | Resp 18 | Ht 63.0 in | Wt 171.8 lb

## 2024-06-30 DIAGNOSIS — N63 Unspecified lump in unspecified breast: Secondary | ICD-10-CM

## 2024-06-30 DIAGNOSIS — Z170 Estrogen receptor positive status [ER+]: Secondary | ICD-10-CM

## 2024-06-30 DIAGNOSIS — N6310 Unspecified lump in the right breast, unspecified quadrant: Secondary | ICD-10-CM | POA: Diagnosis present

## 2024-06-30 DIAGNOSIS — Z807 Family history of other malignant neoplasms of lymphoid, hematopoietic and related tissues: Secondary | ICD-10-CM | POA: Diagnosis not present

## 2024-06-30 DIAGNOSIS — Z808 Family history of malignant neoplasm of other organs or systems: Secondary | ICD-10-CM | POA: Diagnosis not present

## 2024-06-30 DIAGNOSIS — Z803 Family history of malignant neoplasm of breast: Secondary | ICD-10-CM | POA: Insufficient documentation

## 2024-06-30 DIAGNOSIS — Z9221 Personal history of antineoplastic chemotherapy: Secondary | ICD-10-CM | POA: Insufficient documentation

## 2024-06-30 DIAGNOSIS — Z801 Family history of malignant neoplasm of trachea, bronchus and lung: Secondary | ICD-10-CM | POA: Diagnosis not present

## 2024-06-30 DIAGNOSIS — Z853 Personal history of malignant neoplasm of breast: Secondary | ICD-10-CM | POA: Insufficient documentation

## 2024-06-30 DIAGNOSIS — Z9013 Acquired absence of bilateral breasts and nipples: Secondary | ICD-10-CM | POA: Insufficient documentation

## 2024-06-30 DIAGNOSIS — C50512 Malignant neoplasm of lower-outer quadrant of left female breast: Secondary | ICD-10-CM | POA: Diagnosis not present

## 2024-06-30 DIAGNOSIS — Z800 Family history of malignant neoplasm of digestive organs: Secondary | ICD-10-CM | POA: Diagnosis not present

## 2024-06-30 NOTE — Progress Notes (Signed)
 [facility] Cancer Follow up:    Krystal Lynwood FALCON, MD [address] [place] [address]   DIAGNOSIS: Cancer Staging  Breast cancer of lower-outer quadrant of left female breast [facility]) Staging form: Breast, AJCC 6th Edition - Clinical: Stage Unknown (TX, N1, MX) - Signed by Fernand Sharma POUR, MD on 08/31/2013 Histopathologic type: Infiltrating duct carcinoma, NOS Laterality: Left - Pathologic: No stage assigned - Unsigned Histopathologic type: Infiltrating duct carcinoma, NOS Laterality: Left    SUMMARY OF ONCOLOGIC HISTORY: Oncology History  Breast cancer of lower-outer quadrant of left female breast (HCC)  12/28/2010 Surgery   Bilateral mastectomies: Left: no residual cancer, 1/15 LN positive; Right: benign, 0/3 LN   01/29/2011 - 07/02/2011 Chemotherapy   adjuvant chemotherapy following NSABP B. 49 clinical trial. She received 4 cycles of dose dense Adriamycin and Cytoxan followed by weekly single agent Taxol     07/31/2011 - 07/2021 Anti-estrogen oral therapy   tamoxifen  10 mg daily daily. A total of 10 years therapy is planned based on BCI testing inability to be performed to to insufficient tissue. She has also been on Aromasin  and Arimidex  in the past, and was unable to tolerate these      CURRENT THERAPY: observation  INTERVAL HISTORY:  Discussed the use of AI scribe software for clinical note transcription with the patient, who gave verbal consent to proceed.  History of Present Illness Krystal [name] is a 73 year old female with estrogen receptor positive left breast cancer, status post bilateral mastectomy, adjuvant chemotherapy, and 10 years of tamoxifen , who presents for evaluation of a new right chest wall mass.  She has noticed a new palpable right chest wall mass that is sore and intermittently firm. The mass is mobile. She has not felt similar areas in the left chest wall or axilla. She is concerned about malignancy and understands the  process of imaging and biopsy.  She has persistent abdominal bloating and increased flatulence despite dietary changes with fiber and more fruit. She denies abdominal pain or other gastrointestinal symptoms. She is following up with her PCP about this.  She has chronic back pain related to scoliosis with morning stiffness that improves after several hours of activity. She is followed by a spine specialist, has had injections with limited relief, and prefers to avoid surgery while awaiting further treatment.     Patient Active Problem List   Diagnosis Date Noted   Breast cancer of lower-outer quadrant of left female breast (HCC) 01/16/2011    Priority: High   Raynaud's disease 11/05/2022   Osteoporosis, post-menopausal 11/05/2022   Atrial flutter (HCC) 10/02/2022   First degree AV block 09/11/2022   Arthritis 08/08/2022   Persistent atrial fibrillation (HCC) 08/06/2022   Depression 02/27/2022   Gastroesophageal reflux disease 02/27/2022   Hypertension 02/27/2022   Abnormal LFTs 02/27/2022   Alcohol  abuse 02/27/2022   Atrial fibrillation with RVR (HCC) 02/27/2022   Morbid obesity (HCC) 02/27/2022   Pes anserinus bursitis of left knee 02/08/2020   Status post total knee replacement using cement, left 09/22/2019   Status post total knee replacement using cement, right 08/12/2018   Rotator cuff rupture    Status post bilateral breast reconstruction 02/06/2013   Acquired absence of bilateral breasts and nipples 09/17/2012    is allergic to cephalexin  and levaquin [levofloxacin].  MEDICAL HISTORY: Past Medical History:  Diagnosis Date   Anemia    Arthritis    osteo   Blindness of right eye at birth  Cancer [facility]) 2011   Left breast 2011 and cervical age 84   Carpal tunnel syndrome    Cataract    Depression    Edema 07/27/2011   Fibromyalgia    muscle weakness and pain   GERD (gastroesophageal reflux disease)    Hyperlipidemia    Hypertension    benign   Lymphedema     Osteoporosis    Plantar fasciitis    Pre-diabetes    Raynaud's disease    Restless leg syndrome    Rosacea    Rotator cuff rupture    Sleep apnea    Synovitis    Ulcer     SURGICAL HISTORY: Past Surgical History:  Procedure Laterality Date   ABDOMINAL HYSTERECTOMY  1986   For bleeding and pain   ATRIAL FIBRILLATION ABLATION N/A 05/14/2023   Procedure: ATRIAL FIBRILLATION ABLATION;  Surgeon: Cindie Ole DASEN, MD;  Location: [facility];  Service: Cardiovascular;  Laterality: N/A;   BLADDER SURGERY  2006   Bladder tack   BREAST RECONSTRUCTION Bilateral 09/17/2012   Procedure: BREAST RECONSTRUCTION;  Surgeon: Estefana Reichert, DO;  Location: [facility];  Service: Plastics;  Laterality: Bilateral;  BILATERAL BREAST RECONSTRUCTION WITH TISSUE EXPANDERS AND ALLOMED   BREAST RECONSTRUCTION Right 06/03/2013   Procedure: RIGHT BREAST CAPSULE CONSTRACTURE;  Surgeon: Estefana Reichert, DO;  Location: [facility];  Service: Plastics;  Laterality: Right;   CAPSULECTOMY Right 11/18/2023   Procedure: EXCISION OF RIGHT BREAST CAPSULE CONTRACTURE AND BREAST TISSUE;  Surgeon: Lowery Estefana RAMAN, DO;  Location: [facility];  Service: Plastics;  Laterality: Right;   CARDIOVERSION N/A 08/06/2022   Procedure: CARDIOVERSION;  Surgeon: Darron Deatrice LABOR, MD;  Location: [facility];  Service: Cardiovascular;  Laterality: N/A;   CARDIOVERSION N/A 10/02/2022   Procedure: CARDIOVERSION;  Surgeon: Mady Bruckner, MD;  Location: [facility];  Service: Cardiovascular;  Laterality: N/A;   CARPAL TUNNEL RELEASE Bilateral 2008   EYE SURGERY Right    INJECTION KNEE Left 08/12/2018   Procedure: KNEE INJECTION-LEFT;  Surgeon: Edie Norleen PARAS, MD;  Location: [facility];  Service: Orthopedics;  Laterality: Left;   JOINT REPLACEMENT     LIPOSUCTION Bilateral 01/29/2013   Procedure: LIPOSUCTION;  Surgeon: Estefana Reichert, DO;  Location: [facility];  Service: Plastics;   Laterality: Bilateral;   LIPOSUCTION Right 06/03/2013   Procedure: LIPOSUCTION;  Surgeon: Estefana Reichert, DO;  Location: [facility];  Service: Plastics;  Laterality: Right;   LIPOSUCTION Right 11/18/2023   Procedure: LIPOSUCTION LATERAL RIGHT CHEST;  Surgeon: Lowery Estefana RAMAN, DO;  Location: [facility];  Service: Plastics;  Laterality: Right;   MASTECTOMY  2012   rt prophalactic mast-snbx   MASTECTOMY MODIFIED RADICAL  2012   left-axillary nodes   NEUROMA SURGERY Bilateral    NOSE SURGERY  2007   PORT-A-CATH REMOVAL     insertion and   REVERSE SHOULDER ARTHROPLASTY Left 01/16/2024   Procedure: ARTHROPLASTY, SHOULDER, TOTAL, REVERSE;  Surgeon: Melita Drivers, MD;  Location: [facility];  Service: Orthopedics;  Laterality: Left;   REVISION, RECONSTRUCTION, BREAST Right 11/18/2023   Procedure: REVISION RIGHT BREAST RECONSTRUCTION;  Surgeon: Lowery Estefana RAMAN, DO;  Location: [facility];  Service: Plastics;  Laterality: Right;  Excision of scar, capsule contracture right breast , possible liposuction   THROAT SURGERY     TONSILLECTOMY     TOTAL KNEE ARTHROPLASTY Right 08/12/2018   Procedure: TOTAL KNEE ARTHROPLASTY-RIGHT;  Surgeon: Edie Norleen PARAS, MD;  Location: [facility];  Service: Orthopedics;  Laterality: Right;   TOTAL KNEE ARTHROPLASTY Left 09/22/2019   Procedure: TOTAL KNEE ARTHROPLASTY;  Surgeon: Edie Norleen PARAS, MD;  Location: [facility];  Service: Orthopedics;  Laterality: Left;   UVULOPALATOPHARYNGOPLASTY      SOCIAL HISTORY: Social History   Socioeconomic History   Marital status: Married    Spouse name: Not on file   Number of children: Not on file   Years of education: Not on file   Highest education level: Not on file  Occupational History   Not on file  Tobacco Use   Smoking status: Never    Passive exposure: Never   Smokeless tobacco: Never  Vaping Use   Vaping status: Never Used  Substance and Sexual Activity   Alcohol  use: Yes     Comment: occ wine   Drug use: No   Sexual activity: Yes  Other Topics Concern   Not on file  Social History Narrative   Not on file   Social Drivers of Health   Financial Resource Strain: Low Risk  (06/30/2024)   Overall Financial Resource Strain (CARDIA)    Difficulty of Paying Living Expenses: Not hard at all  Food Insecurity: No Food Insecurity (06/30/2024)   Hunger Vital Sign    Worried About Running Out of Food in the Last Year: Never true    Ran Out of Food in the Last Year: Never true  Transportation Needs: No Transportation Needs (06/30/2024)   PRAPARE - Administrator, Civil Service (Medical): No    Lack of Transportation (Non-Medical): No  Physical Activity: Sufficiently Active (06/30/2024)   Exercise Vital Sign    Days of Exercise per Week: 5 days    Minutes of Exercise per Session: 30 min  Stress: No Stress Concern Present (06/30/2024)   [employer] of Occupational Health - Occupational Stress Questionnaire    Feeling of Stress: Not at all  Social Connections: Socially Integrated (06/30/2024)   Social Connection and Isolation Panel    Frequency of Communication with Friends and Family: More than three times a week    Frequency of Social Gatherings with Friends and Family: More than three times a week    Attends Religious Services: More than 4 times per year    Active Member of [employer] or Organizations: Yes    Attends Engineer, Structural: More than 4 times per year    Marital Status: Married  Catering Manager Violence: Not At Risk (06/30/2024)   Humiliation, Afraid, Rape, and Kick questionnaire    Fear of Current or Ex-Partner: No    Emotionally Abused: No    Physically Abused: No    Sexually Abused: No    FAMILY HISTORY: Family History  Problem Relation Age of Onset   Cancer Mother 80       non hodgkins lymphoma   Cancer Father        neck, throat, lung   Lung cancer Father    Throat cancer Father    Hypertension Sister    Cancer  Sister 24       Breast cancer dx'd 04/2011   Hypertension Brother    Breast cancer Maternal Aunt    Liver cancer Maternal Uncle    Cancer Maternal Grandmother        died 52, unknown cancer   Cancer Maternal Grandfather        died in his 109s; unknown cancer   Lung cancer Maternal Uncle    Throat  cancer Maternal Uncle    Breast cancer Maternal Aunt    Cancer Cousin        died under the age 78, unknown cancer   Cancer Cousin        died age 71; unknown cancer    Review of Systems  Constitutional:  Negative for appetite change, chills, fatigue, fever and unexpected weight change.  HENT:   Negative for hearing loss, lump/mass and trouble swallowing.   Eyes:  Negative for eye problems and icterus.  Respiratory:  Negative for chest tightness, cough and shortness of breath.   Cardiovascular:  Negative for chest pain, leg swelling and palpitations.  Gastrointestinal:  Negative for abdominal distention, abdominal pain, constipation, diarrhea, nausea and vomiting.  Endocrine: Negative for hot flashes.  Genitourinary:  Negative for difficulty urinating.   Musculoskeletal:  Negative for arthralgias.  Skin:  Negative for itching and rash.  Neurological:  Negative for dizziness, extremity weakness, headaches and numbness.  Hematological:  Negative for adenopathy. Does not bruise/bleed easily.  Psychiatric/Behavioral:  Negative for depression. The patient is not nervous/anxious.       PHYSICAL EXAMINATION Vital Signs (VSS):  Blood Pressure: 138/68 mmHg (Left Arm, Sitting)  Pulse: 86 bpm  Respirations: 18/min  Temperature: 97.9 F  Oxygen Saturation: 97%    Physical Exam Constitutional:      General: She is not in acute distress.    Appearance: Normal appearance. She is not toxic-appearing.  HENT:     Head: Normocephalic and atraumatic.     Mouth/Throat:     Mouth: Mucous membranes are moist.     Pharynx: Oropharynx is clear. No oropharyngeal exudate or posterior  oropharyngeal erythema.  Eyes:     General: No scleral icterus. Cardiovascular:     Rate and Rhythm: Normal rate and regular rhythm.     Pulses: Normal pulses.     Heart sounds: Normal heart sounds.  Pulmonary:     Effort: Pulmonary effort is normal.     Breath sounds: Normal breath sounds.  Chest:     Comments: S/p bilateral mastectomy, left chest wall is benign, thickness and soft nodularity noted at inner mastectomy scar line Abdominal:     General: Abdomen is flat. Bowel sounds are normal. There is no distension.     Palpations: Abdomen is soft.     Tenderness: There is no abdominal tenderness.  Musculoskeletal:        General: No swelling.     Cervical back: Neck supple.  Lymphadenopathy:     Cervical: No cervical adenopathy.     Upper Body:     Right upper body: No supraclavicular or axillary adenopathy.     Left upper body: No supraclavicular or axillary adenopathy.  Skin:    General: Skin is warm and dry.     Findings: No rash.  Neurological:     General: No focal deficit present.     Mental Status: She is alert.  Psychiatric:        Mood and Affect: Mood normal.        Behavior: Behavior normal.      ASSESSMENT and THERAPY PLAN:    Assessment & Plan Right breast mass New palpable, mobile, intermittently firm mass. Malignancy must be excluded due to prior breast cancer history. Mobility and smoothness reassuring, but further evaluation needed due to intermittent firmness. - Ordered right  chest wall ultrasound. - Instructed her that the breast center will contact her to schedule the ultrasound. - Advised that if the  ultrasound is abnormal, a biopsy will be ordered. - Instructed her to notify the office if she does not hear from the breast center within a few days.  History of estrogen receptor positive left breast cancer, status post bilateral mastectomy and adjuvant therapy Completed bilateral mastectomy, adjuvant chemotherapy, and ten years of tamoxifen  in  2023. - Review right breast ultrasound results and proceed with further workup if indicated.  RTC as noted above.     All questions were answered. The patient knows to call the clinic with any problems, questions or concerns. We can certainly see the patient much sooner if necessary.  Total encounter time:20 minutes*in face-to-face visit time, chart review, lab review, care coordination, order entry, and documentation of the encounter time.    Morna Kendall, NP 06/30/24 10:15 AM Medical Oncology and Hematology [facility] [address] [place], [place] [address] Tel. [phone]    Fax. [phone]  *Total Encounter Time as defined by the Centers for Medicare and Medicaid Services includes, in addition to the face-to-face time of a patient visit (documented in the note above) non-face-to-face time: obtaining and reviewing outside history, ordering and reviewing medications, tests or procedures, care coordination (communications with other health care professionals or caregivers) and documentation in the medical record.

## 2024-07-03 ENCOUNTER — Other Ambulatory Visit: Payer: Self-pay | Admitting: Adult Health

## 2024-07-03 DIAGNOSIS — N63 Unspecified lump in unspecified breast: Secondary | ICD-10-CM

## 2024-07-03 DIAGNOSIS — Z170 Estrogen receptor positive status [ER+]: Secondary | ICD-10-CM

## 2024-07-10 ENCOUNTER — Inpatient Hospital Stay: Admission: RE | Admit: 2024-07-10 | Discharge: 2024-07-10 | Attending: Adult Health | Admitting: Adult Health

## 2024-07-10 ENCOUNTER — Ambulatory Visit (HOSPITAL_COMMUNITY): Admission: RE | Admit: 2024-07-10 | Source: Ambulatory Visit

## 2024-07-10 DIAGNOSIS — N63 Unspecified lump in unspecified breast: Secondary | ICD-10-CM

## 2024-07-10 DIAGNOSIS — Z170 Estrogen receptor positive status [ER+]: Secondary | ICD-10-CM

## 2024-07-20 ENCOUNTER — Other Ambulatory Visit: Payer: Self-pay | Admitting: Cardiology

## 2024-09-14 ENCOUNTER — Ambulatory Visit: Payer: Medicare HMO | Admitting: Urology

## 2025-04-19 ENCOUNTER — Encounter: Admitting: Adult Health
